# Patient Record
Sex: Female | Born: 1950
Health system: Southern US, Community
[De-identification: ages and names within clinical notes are randomized; demographics above are authoritative.]

## PROBLEM LIST (undated history)

## (undated) DIAGNOSIS — C801 Malignant (primary) neoplasm, unspecified: Secondary | ICD-10-CM

---

## 2017-09-16 ENCOUNTER — Other Ambulatory Visit: Payer: Self-pay | Admitting: Internal Medicine

## 2017-09-16 ENCOUNTER — Ambulatory Visit
Admission: RE | Admit: 2017-09-16 | Discharge: 2017-09-16 | Disposition: A | Discharge status: Home / Self Care | Payer: Self-pay | Source: Ambulatory Visit | Attending: Internal Medicine | Admitting: Internal Medicine

## 2017-09-16 DIAGNOSIS — R0789 Other chest pain: Secondary | ICD-10-CM

## 2017-09-19 ENCOUNTER — Inpatient Hospital Stay (HOSPITAL_COMMUNITY)
Admission: EM | Admit: 2017-09-19 | Discharge: 2017-09-28 | DRG: 840 | Disposition: A | Discharge status: Home / Self Care | Payer: Self-pay | Attending: Internal Medicine | Admitting: Internal Medicine

## 2017-09-19 ENCOUNTER — Emergency Department (HOSPITAL_COMMUNITY): Payer: Self-pay

## 2017-09-19 ENCOUNTER — Encounter (HOSPITAL_COMMUNITY): Payer: Self-pay | Admitting: *Deleted

## 2017-09-19 ENCOUNTER — Ambulatory Visit (HOSPITAL_COMMUNITY)
Admission: EM | Admit: 2017-09-19 | Discharge: 2017-09-19 | Disposition: A | Discharge status: Home / Self Care | Payer: Self-pay | Attending: Internal Medicine | Admitting: Internal Medicine

## 2017-09-19 ENCOUNTER — Inpatient Hospital Stay (HOSPITAL_COMMUNITY): Payer: Self-pay

## 2017-09-19 ENCOUNTER — Other Ambulatory Visit: Payer: Self-pay

## 2017-09-19 DIAGNOSIS — M899 Disorder of bone, unspecified: Secondary | ICD-10-CM

## 2017-09-19 DIAGNOSIS — R188 Other ascites: Secondary | ICD-10-CM | POA: Diagnosis present

## 2017-09-19 DIAGNOSIS — R160 Hepatomegaly, not elsewhere classified: Secondary | ICD-10-CM

## 2017-09-19 DIAGNOSIS — R0600 Dyspnea, unspecified: Secondary | ICD-10-CM

## 2017-09-19 DIAGNOSIS — N1832 Chronic kidney disease, stage 3b: Secondary | ICD-10-CM

## 2017-09-19 DIAGNOSIS — I509 Heart failure, unspecified: Secondary | ICD-10-CM | POA: Diagnosis present

## 2017-09-19 DIAGNOSIS — E162 Hypoglycemia, unspecified: Secondary | ICD-10-CM | POA: Diagnosis present

## 2017-09-19 DIAGNOSIS — I071 Rheumatic tricuspid insufficiency: Secondary | ICD-10-CM | POA: Diagnosis present

## 2017-09-19 DIAGNOSIS — D689 Coagulation defect, unspecified: Secondary | ICD-10-CM | POA: Diagnosis present

## 2017-09-19 DIAGNOSIS — R079 Chest pain, unspecified: Secondary | ICD-10-CM

## 2017-09-19 DIAGNOSIS — N179 Acute kidney failure, unspecified: Secondary | ICD-10-CM | POA: Diagnosis present

## 2017-09-19 DIAGNOSIS — T380X5A Adverse effect of glucocorticoids and synthetic analogues, initial encounter: Secondary | ICD-10-CM | POA: Diagnosis present

## 2017-09-19 DIAGNOSIS — C9002 Multiple myeloma in relapse: Secondary | ICD-10-CM

## 2017-09-19 DIAGNOSIS — I4892 Unspecified atrial flutter: Secondary | ICD-10-CM | POA: Diagnosis present

## 2017-09-19 DIAGNOSIS — K761 Chronic passive congestion of liver: Secondary | ICD-10-CM | POA: Diagnosis present

## 2017-09-19 DIAGNOSIS — K7200 Acute and subacute hepatic failure without coma: Secondary | ICD-10-CM | POA: Diagnosis present

## 2017-09-19 DIAGNOSIS — D649 Anemia, unspecified: Secondary | ICD-10-CM

## 2017-09-19 DIAGNOSIS — I110 Hypertensive heart disease with heart failure: Secondary | ICD-10-CM | POA: Diagnosis present

## 2017-09-19 DIAGNOSIS — R000 Tachycardia, unspecified: Secondary | ICD-10-CM

## 2017-09-19 DIAGNOSIS — R071 Chest pain on breathing: Secondary | ICD-10-CM

## 2017-09-19 DIAGNOSIS — J90 Pleural effusion, not elsewhere classified: Secondary | ICD-10-CM

## 2017-09-19 DIAGNOSIS — I272 Pulmonary hypertension, unspecified: Secondary | ICD-10-CM | POA: Diagnosis present

## 2017-09-19 DIAGNOSIS — E875 Hyperkalemia: Secondary | ICD-10-CM | POA: Diagnosis present

## 2017-09-19 DIAGNOSIS — J9811 Atelectasis: Secondary | ICD-10-CM | POA: Diagnosis present

## 2017-09-19 DIAGNOSIS — I484 Atypical atrial flutter: Secondary | ICD-10-CM

## 2017-09-19 DIAGNOSIS — D6959 Other secondary thrombocytopenia: Secondary | ICD-10-CM | POA: Diagnosis present

## 2017-09-19 DIAGNOSIS — D630 Anemia in neoplastic disease: Secondary | ICD-10-CM | POA: Diagnosis present

## 2017-09-19 DIAGNOSIS — C9000 Multiple myeloma not having achieved remission: Principal | ICD-10-CM | POA: Diagnosis present

## 2017-09-19 DIAGNOSIS — E861 Hypovolemia: Secondary | ICD-10-CM | POA: Diagnosis present

## 2017-09-19 LAB — URINALYSIS, ROUTINE W REFLEX MICROSCOPIC
BILIRUBIN URINE: NEGATIVE
Glucose, UA: NEGATIVE mg/dL
Ketones, ur: NEGATIVE mg/dL
Nitrite: NEGATIVE
Protein, ur: 30 mg/dL — AB
SPECIFIC GRAVITY, URINE: 1.011 (ref 1.005–1.030)
pH: 6 (ref 5.0–8.0)

## 2017-09-19 LAB — COMPREHENSIVE METABOLIC PANEL
ALT: 42 U/L (ref 0–44)
ANION GAP: 13 (ref 5–15)
AST: 65 U/L — ABNORMAL HIGH (ref 15–41)
Albumin: 2.5 g/dL — ABNORMAL LOW (ref 3.5–5.0)
Alkaline Phosphatase: 69 U/L (ref 38–126)
BILIRUBIN TOTAL: 0.7 mg/dL (ref 0.3–1.2)
BUN: 15 mg/dL (ref 8–23)
CALCIUM: 9.2 mg/dL (ref 8.9–10.3)
CO2: 22 mmol/L (ref 22–32)
CREATININE: 1.45 mg/dL — AB (ref 0.44–1.00)
Chloride: 101 mmol/L (ref 98–111)
GFR calc Af Amer: 42 mL/min — ABNORMAL LOW (ref >60)
GFR calc non Af Amer: 36 mL/min — ABNORMAL LOW (ref >60)
GLUCOSE: 109 mg/dL — AB (ref 70–99)
Potassium: 4.4 mmol/L (ref 3.5–5.1)
Sodium: 136 mmol/L (ref 135–145)
TOTAL PROTEIN: 10.8 g/dL — AB (ref 6.5–8.1)

## 2017-09-19 LAB — I-STAT TROPONIN, ED: TROPONIN I, POC: 0 ng/mL (ref 0.00–0.08)

## 2017-09-19 LAB — POCT I-STAT, CHEM 8
BUN: 17 mg/dL (ref 8–23)
CHLORIDE: 100 mmol/L (ref 98–111)
CREATININE: 1.5 mg/dL — AB (ref 0.44–1.00)
Calcium, Ion: 1.26 mmol/L (ref 1.15–1.40)
Glucose, Bld: 112 mg/dL — ABNORMAL HIGH (ref 70–99)
HEMATOCRIT: 31 % — AB (ref 36.0–46.0)
Hemoglobin: 10.5 g/dL — ABNORMAL LOW (ref 12.0–15.0)
Potassium: 4.3 mmol/L (ref 3.5–5.1)
Sodium: 138 mmol/L (ref 135–145)
TCO2: 25 mmol/L (ref 22–32)

## 2017-09-19 LAB — TROPONIN I: Troponin I: <0.03 ng/mL (ref <0.03)

## 2017-09-19 LAB — CBC
HCT: 28.4 % — ABNORMAL LOW (ref 36.0–46.0)
Hemoglobin: 8.8 g/dL — ABNORMAL LOW (ref 12.0–15.0)
MCH: 26.9 pg (ref 26.0–34.0)
MCHC: 31 g/dL (ref 30.0–36.0)
MCV: 86.9 fL (ref 78.0–100.0)
PLATELETS: 110 10*3/uL — AB (ref 150–400)
RBC: 3.27 MIL/uL — ABNORMAL LOW (ref 3.87–5.11)
RDW: 20.4 % — AB (ref 11.5–15.5)
WBC: 7 10*3/uL (ref 4.0–10.5)

## 2017-09-19 LAB — T4, FREE: Free T4: 1.13 ng/dL (ref 0.82–1.77)

## 2017-09-19 LAB — TYPE AND SCREEN
ABO/RH(D): O POS
ANTIBODY SCREEN: NEGATIVE

## 2017-09-19 LAB — MRSA PCR SCREENING: MRSA BY PCR: NEGATIVE

## 2017-09-19 LAB — FERRITIN: Ferritin: 463 ng/mL — ABNORMAL HIGH (ref 11–307)

## 2017-09-19 LAB — TSH
TSH: 2.5 u[IU]/mL (ref 0.350–4.500)
TSH: 2.603 u[IU]/mL (ref 0.350–4.500)

## 2017-09-19 LAB — MAGNESIUM: Magnesium: 2.1 mg/dL (ref 1.7–2.4)

## 2017-09-19 LAB — FOLATE: Folate: 7.7 ng/mL (ref >5.9)

## 2017-09-19 LAB — RETICULOCYTES
RBC.: 3.22 MIL/uL — AB (ref 3.87–5.11)
RETIC CT PCT: 2.9 % (ref 0.4–3.1)
Retic Count, Absolute: 93.4 10*3/uL (ref 19.0–186.0)

## 2017-09-19 LAB — I-STAT CG4 LACTIC ACID, ED
Lactic Acid, Venous: 2.21 mmol/L (ref 0.5–1.9)
Lactic Acid, Venous: 1.58 mmol/L (ref 0.5–1.9)

## 2017-09-19 LAB — ABO/RH: ABO/RH(D): O POS

## 2017-09-19 LAB — IRON AND TIBC
Iron: 111 ug/dL (ref 28–170)
Saturation Ratios: 51 % — ABNORMAL HIGH (ref 10.4–31.8)
TIBC: 218 ug/dL — ABNORMAL LOW (ref 250–450)
UIBC: 107 ug/dL

## 2017-09-19 LAB — LACTATE DEHYDROGENASE: LDH: 417 U/L — ABNORMAL HIGH (ref 98–192)

## 2017-09-19 LAB — VITAMIN B12: Vitamin B-12: 726 pg/mL (ref 180–914)

## 2017-09-19 LAB — SAVE SMEAR

## 2017-09-19 LAB — BRAIN NATRIURETIC PEPTIDE: B Natriuretic Peptide: 476.7 pg/mL — ABNORMAL HIGH (ref 0.0–100.0)

## 2017-09-19 MED ORDER — IOPAMIDOL (ISOVUE-370) INJECTION 76%
80.0000 mL | Freq: Once | INTRAVENOUS | Status: AC | PRN
  Administered 2017-09-19: 50 mL via INTRAVENOUS

## 2017-09-19 MED ORDER — AMIODARONE HCL IN DEXTROSE 360-4.14 MG/200ML-% IV SOLN
60.0000 mg/h | INTRAVENOUS | Status: AC
  Administered 2017-09-19 (×2): 60 mg/h via INTRAVENOUS
  Filled 2017-09-19 (×2): qty 200

## 2017-09-19 MED ORDER — MORPHINE SULFATE (PF) 2 MG/ML IV SOLN: 1.0000 mg | INTRAVENOUS | Status: DC | PRN

## 2017-09-19 MED ORDER — HEPARIN BOLUS VIA INFUSION
4000.0000 [IU] | Freq: Once | INTRAVENOUS | Status: DC
  Filled 2017-09-19: qty 4000

## 2017-09-19 MED ORDER — AMIODARONE HCL IN DEXTROSE 360-4.14 MG/200ML-% IV SOLN
30.0000 mg/h | INTRAVENOUS | Status: DC
  Administered 2017-09-20: 30 mg/h via INTRAVENOUS
  Filled 2017-09-19 (×2): qty 200

## 2017-09-19 MED ORDER — SENNOSIDES-DOCUSATE SODIUM 8.6-50 MG PO TABS: 1.0000 | ORAL_TABLET | Freq: Every evening | ORAL | Status: DC | PRN

## 2017-09-19 MED ORDER — ASPIRIN 81 MG PO CHEW
CHEWABLE_TABLET | ORAL | Status: AC
  Filled 2017-09-19: qty 4

## 2017-09-19 MED ORDER — ACETAMINOPHEN 325 MG PO TABS: 650.0000 mg | ORAL_TABLET | Freq: Four times a day (QID) | ORAL | Status: DC | PRN

## 2017-09-19 MED ORDER — HEPARIN (PORCINE) IN NACL 100-0.45 UNIT/ML-% IJ SOLN
950.0000 [IU]/h | INTRAMUSCULAR | Status: DC
  Administered 2017-09-19: 950 [IU]/h via INTRAVENOUS
  Filled 2017-09-19 (×3): qty 250

## 2017-09-19 MED ORDER — AMIODARONE LOAD VIA INFUSION
150.0000 mg | Freq: Once | INTRAVENOUS | Status: AC
  Administered 2017-09-19: 150 mg via INTRAVENOUS
  Filled 2017-09-19: qty 83.34

## 2017-09-19 MED ORDER — HYDRALAZINE HCL 20 MG/ML IJ SOLN: 5.0000 mg | Freq: Three times a day (TID) | INTRAMUSCULAR | Status: DC | PRN

## 2017-09-19 MED ORDER — ASPIRIN 81 MG PO CHEW
324.0000 mg | CHEWABLE_TABLET | Freq: Once | ORAL | Status: AC
  Administered 2017-09-19: 324 mg via ORAL

## 2017-09-19 MED ORDER — ONDANSETRON HCL 4 MG PO TABS: 4.0000 mg | ORAL_TABLET | Freq: Four times a day (QID) | ORAL | Status: DC | PRN

## 2017-09-19 MED ORDER — ACETAMINOPHEN 650 MG RE SUPP
650.0000 mg | Freq: Four times a day (QID) | RECTAL | Status: DC | PRN
Start: 2017-09-19 — End: 2017-09-24

## 2017-09-19 MED ORDER — HYDROCODONE-ACETAMINOPHEN 5-325 MG PO TABS
1.0000 | ORAL_TABLET | ORAL | Status: DC | PRN
  Administered 2017-09-19 – 2017-09-22 (×2): 1 via ORAL
  Administered 2017-09-22: 2 via ORAL
  Filled 2017-09-19: qty 2
  Filled 2017-09-19 (×2): qty 1

## 2017-09-19 MED ORDER — ONDANSETRON HCL 4 MG/2ML IJ SOLN
4.0000 mg | Freq: Four times a day (QID) | INTRAMUSCULAR | Status: DC | PRN
  Administered 2017-09-20 (×2): 4 mg via INTRAVENOUS
  Filled 2017-09-19 (×2): qty 2

## 2017-09-19 MED ORDER — BISACODYL 10 MG RE SUPP: 10.0000 mg | Freq: Every day | RECTAL | Status: DC | PRN

## 2017-09-19 MED ORDER — IOPAMIDOL (ISOVUE-370) INJECTION 76%
INTRAVENOUS | Status: AC
  Filled 2017-09-19: qty 100

## 2017-09-19 NOTE — ED Notes (Signed)
Spoke to [place] and [place] regarding heparin orders. Awaiting phone call back from [facility] for concerns with heparin orders

## 2017-09-19 NOTE — ED Notes (Signed)
Notified [facility], notified of code stemi and verified truck is coming for this patient

## 2017-09-19 NOTE — ED Notes (Addendum)
Lactic Acid- 2.21 Charge RN notified

## 2017-09-19 NOTE — ED Triage Notes (Signed)
Patient sent from Urgent Care for further evaluation of chest pain x3 weeks. Initially activated as code STEMI, cancelled by cardiology. Patient states she feels "like I have fluid built up". Patient alert, oriented, and in no apparent distress at this time.

## 2017-09-19 NOTE — Consult Note (Addendum)
Cardiology Consultation:   Patient ID: Debbie Chan; [id number]; 1950-05-18   Admit date: 09/19/2017 Date of Consult: 09/19/2017  Primary Care Provider: [name], MD Primary Cardiologist: new - initial consult with Dr. Haroldine Laws Primary Electrophysiologist:     Patient Profile:   Debbie Chan is a 68 y.o. female with no prior medical history who is being seen today for the evaluation of chest pain and abnormal EKG at the request of Dr. Reather Converse.  History of Present Illness:   Debbie Chan is visiting from [place] and does not have a PCP here. Daughter-in-law, an Therapist, sports at [facility], and son are at bedside to help with history.  Denies h/o known cardiac disease. Approximately 3 weeks ago, she developed cough and chest pain. She saw a doctor from her church who ordered a CXR and showed pleural effusion (09/16/17). She describes chest pain that is more consistent with bilateral chest wall pain on her lower rib cage. Chest wall pain is worse with cough and sometimes with deep inspiration. She also reports feeling palpitations and heart racing, although timeline is difficult to ascertain. She reported to urgent care and EKG was concerning for STEMI. Cardiology was consulted, but felt EKG was more consistent with atrial flutter RVR. She was transferred to [facility] for further evaluation.   Upon arrival, she had an elevated lactic acid of 2.21 and AKI with creatinine 1.50. Hb 10.5. Troponin POC negative. Repeat labs with Hb 8.8 and creatinine 1.45. She continues to complain of chest wall pain with cough.   PMHx:  None  PSHx:  None  Home Medications:  Prior to Admission medications   Medication Sig Start Date End Date Taking? Authorizing Provider  acetaminophen (TYLENOL) 500 MG tablet Take 500 mg by mouth every 6 (six) hours as needed for mild pain or moderate pain.    Yes Emergency, Nurse, RN    Inpatient Medications: Scheduled Meds: . iopamidol       Continuous Infusions:  PRN  Meds:   Allergies:   No Known Allergies  Social History:   Social History   Socioeconomic History  . Marital status: Widowed    Spouse name: Not on file  . Number of children: Not on file  . Years of education: Not on file  . Highest education level: Not on file  Occupational History  . Not on file  Social Needs  . Financial resource strain: Not on file  . Food insecurity:    Worry: Not on file    Inability: Not on file  . Transportation needs:    Medical: Not on file    Non-medical: Not on file  Tobacco Use  . Smoking status: Never Smoker  . Smokeless tobacco: Never Used  Substance and Sexual Activity  . Alcohol use: Not on file  . Drug use: Not on file  . Sexual activity: Not on file  Lifestyle  . Physical activity:    Days per week: Not on file    Minutes per session: Not on file  . Stress: Not on file  Relationships  . Social connections:    Talks on phone: Not on file    Gets together: Not on file    Attends religious service: Not on file    Active member of club or organization: Not on file    Attends meetings of clubs or organizations: Not on file    Relationship status: Not on file  . Intimate partner violence:    Fear of current or ex partner: Not on  file    Emotionally abused: Not on file    Physically abused: Not on file    Forced sexual activity: Not on file  Other Topics Concern  . Not on file  Social History Narrative  . Not on file    Family History:   No family h/o heart disease   ROS:  Please see the history of present illness.   All other ROS reviewed and negative.     Physical Exam/Data:   Vitals:   09/19/17 1230 09/19/17 1315 09/19/17 1345 09/19/17 1430  BP: (!) 166/110 (!) 152/114 (!) 143/84 (!) 148/107  Pulse: (!) 137 (!) 137  (!) 136  Resp: (!) 21 (!) 21  (!) 23  Temp:      TempSrc:      SpO2: 98% 93%  98%   No intake or output data in the 24 hours ending 09/19/17 1540 There were no vitals filed for this visit. There is  no height or weight on file to calculate BMI.  General:  Well nourished, well developed, in no acute distress HEENT: normal Neck: no JVD Vascular: No carotid bruits Cardiac:  Tachycardic rate, irregular rhythm no obvious murmur Lungs:  Respirations unlabored, unproductive cough, diminished in bases  Abd: soft, nontender, no hepatomegaly  Ext: no edema warm Musculoskeletal:  No deformities, BUE and BLE strength normal and equal Skin: warm and dry  Neuro:  CNs 2-12 intact, no focal abnormalities noted Psych:  Normal affect   EKG:  The EKG was personally reviewed and demonstrates:  Atrial flutter with RVR Telemetry:  Telemetry was personally reviewed and demonstrates: atrial flutter in the 130s    Relevant CV Studies:  Echo pending  Laboratory Data:  Chemistry Recent Labs  Lab 09/19/17 1057 09/19/17 1123  NA 138 136  K 4.3 4.4  CL 100 101  CO2  --  22  GLUCOSE 112* 109*  BUN 17 15  CREATININE 1.50* 1.45*  CALCIUM  --  9.2  GFRNONAA  --  36*  GFRAA  --  42*  ANIONGAP  --  13    Recent Labs  Lab 09/19/17 1123  PROT 10.8*  ALBUMIN 2.5*  AST 65*  ALT 42  ALKPHOS 69  BILITOT 0.7   Hematology Recent Labs  Lab 09/19/17 1057 09/19/17 1122  WBC  --  7.0  RBC  --  3.27*  HGB 10.5* 8.8*  HCT 31.0* 28.4*  MCV  --  86.9  MCH  --  26.9  MCHC  --  31.0  RDW  --  20.4*  PLT  --  110*   Cardiac EnzymesNo results for input(s): TROPONINI in the last 168 hours.  Recent Labs  Lab 09/19/17 1129  TROPIPOC 0.00    BNP Recent Labs  Lab 09/19/17 1343  BNP 476.7*    DDimer No results for input(s): DDIMER in the last 168 hours.  Radiology/Studies:  Dg Chest 2 View  Result Date: 09/16/2017 CLINICAL DATA:  Anterior chest wall pain for 2 weeks with a nonproductive cough. EXAM: CHEST - 2 VIEW COMPARISON:  None. FINDINGS: The cardiac silhouette is partially obscured though appears borderline enlarged. There is a moderate-sized right pleural effusion with associated  compressive atelectasis. Minimal left basilar opacity likely represents atelectasis. The upper lungs are clear bilaterally. No pneumothorax is identified. No acute osseous abnormality is seen. IMPRESSION: Moderate right pleural effusion with right greater than left basilar atelectasis. Electronically Signed   By: Logan Bores M.D.   On: 09/16/2017 15:47  Ct Angio Chest Pe W And/or Wo Contrast  Result Date: 09/19/2017 CLINICAL DATA:  67 year old female with acute chest pain and shortness of breath for 3 weeks. EXAM: CT ANGIOGRAPHY CHEST WITH CONTRAST TECHNIQUE: Multidetector CT imaging of the chest was performed using the standard protocol during bolus administration of intravenous contrast. Multiplanar CT image reconstructions and MIPs were obtained to evaluate the vascular anatomy. CONTRAST:  4m ISOVUE-370 IOPAMIDOL (ISOVUE-370) INJECTION 76% COMPARISON:  09/19/2017 chest radiograph FINDINGS: Cardiovascular: This is a technically satisfactory study. No pulmonary emboli are identified. Moderate to marked cardiomegaly noted with RIGHT atrial enlargement. Coronary artery and aortic atherosclerotic calcifications identified. No pericardial effusion or thoracic aortic aneurysm. Mediastinum/Nodes: No enlarged mediastinal, hilar, or axillary lymph nodes. Thyroid gland, trachea, and esophagus demonstrate no significant findings. Lungs/Pleura: A moderate RIGHT pleural effusion with RIGHT LOWER lung atelectasis noted. This effusion may be loculated along the RIGHT lung base. No pulmonary nodule, mass or consolidation.  No pneumothorax. Upper Abdomen: Hypodense areas along the liver may represent subcapsular fluid/masses versus parenchymal abnormalities. A small amount of fluid adjacent to the spleen is identified. Musculoskeletal: Multiple lytic lesions of the visualized bones identified involving the spine, ribs, clavicles and sternum. A 3.5 x 7 cm soft tissue destructive mass involving the anterolateral LEFT 7th rib  is noted. Review of the MIP images confirms the above findings. IMPRESSION: 1. Multiple lytic lesions throughout the visualized bones with 3.5 x 7 cm soft tissue mass involving the LEFT 7th rib, worrisome for metastatic disease or myeloma. 2. Moderate RIGHT pleural effusion and RIGHT LOWER lung atelectasis. This effusion may be loculated along the RIGHT lung base. Malignant effusion not excluded given bony findings. 3. Hypodense areas along the liver which may represent subcapsular fluid/masses versus hepatic lesions. Consider dedicated abdominal and pelvic CT with contrast for further evaluation. 4. No evidence of pulmonary emboli 5. Moderate to marked cardiomegaly 6. Coronary artery and Aortic Atherosclerosis (ICD10-I70.0). Electronically Signed   By: JMargarette CanadaM.D.   On: 09/19/2017 14:38   Dg Chest Portable 1 View  Result Date: 09/19/2017 CLINICAL DATA:  Chest pain EXAM: PORTABLE CHEST 1 VIEW COMPARISON:  09/16/2017 FINDINGS: Cardiac shadow remains enlarged. Right basilar atelectasis and associated effusion is again identified stable from the prior exam. Left lung is clear. No bony abnormality is noted. IMPRESSION: Right basilar atelectasis with associated effusions stable from the previous exam. Electronically Signed   By: MInez CatalinaM.D.   On: 09/19/2017 11:43    Assessment and Plan:   1. Chest wall pain - pain with cough in lateral rib cage area consistent with pleural effusion - CT chest with moderate right pleural effusion, soft tissue mass worrisome for metastatic disease or myeloma - defer to medicine/onc  2. Abnormal EKG, atrial flutter with RVR - atrial flutter with RVR - given her anemia, hesitate to anticoagulate - This patients CHA2DS2-VASc Score and unadjusted Ischemic Stroke Rate (% per year) is equal to 2.2 % stroke rate/year from a score of 2 (female, age) - risk factors for higher CHA2DS2-VASc Score may present themselves during this hospitalization - [facility]  given low CHA2DS2-VASc Score and anemia - this may be related to her pleural effusion that is possibly malignant - will start amiodarone IV - will order echocardiogram - TSH and Mg are normal  3. AKI - elevated creatinine  - per primary team  4. Anemia - Hb 8.8 (10.5) - baseline unknown - denies dark tarry stool and bleeding, except nose  bleed after ibuprofen (prescribed for chest wall pain) - no anticoagulation at this time   For questions or updates, please contact [facility] Please consult [email or web address] for contact info under Cardiology/STEMI.   Signed, Ledora Bottcher, [place]  09/19/2017 3:40 PM   Patient seen and examined with the above-signed Advanced Practice Provider and/or Housestaff. I personally reviewed laboratory data, imaging studies and relevant notes. I independently examined the patient and formulated the important aspects of the plan. I have edited the note to reflect any of my changes or salient points. I have personally discussed the plan with the patient and/or family.  67 y/o woman from [place] with no significant PMHx presents with CP, weakness and cough. Initial ECG shows AFL with RVR (unknown duration). Subsequent w/u has shown lytic rib lesion concerning for multiple myeloma or metastatic CA (Oncologic w/u underway).   Remains tachy on exam. BNP up but no evidence of HF on exam. HR in 130 range. ECG with atypical flutter.   Will start IV amio for rate control. Echo ordered. I d/w oncology. Will hold off on anticoagulation for now pending further scans. If OK for [facility] can start heparin as she may need TEE/DC-CV.   We will follow. D/w patient and her son.   Glori Bickers, MD  3:57 PM

## 2017-09-19 NOTE — ED Notes (Signed)
Pt taken to xray 

## 2017-09-19 NOTE — ED Notes (Addendum)
EKG obtained and shown to [facility], EKG meets STEMI criteria.  [facility] called and and Code STEMI activated, transportation notified.  Pt placed on the [place]

## 2017-09-19 NOTE — ED Notes (Signed)
MD at bedside. 

## 2017-09-19 NOTE — ED Triage Notes (Signed)
Patient states that her breathing is fast and that her abdomen/chest feels "heavy." Pt complains of feeling dizzy for the last couple weeks. Pt has results of chest x-ray done 09/16/17- findings+ right pleural effusion.

## 2017-09-19 NOTE — H&P (Signed)
History and Physical    Gertude Benito YHC:[id number] DOB: [birth year] DOA: 09/19/2017   PCP: [name], MD   Patient coming from:  Home    Chief Complaint: Shortness of breath and chest pain   HPI: Debbie Chan is a 67 y.o. female with no prior documented medical history, visiting from [place], arriving last May, staying with her family, brought from urgent care, after presenting with left-sided chest pressure, shest wall pain especially at the left lower rib cage.shortness of breath for the last 3 weeks without cough.  This was preceded by a visit to Dr. Annamarie Major, with similar complaints, who ordered a CXR, on 7/24 showing pleural effusion. She was to follow up but her symptoms worsened.  The patient denies any fever, chills or night sweats. Denies lower extremity pain, swelling, tenderness or erythema. Denies pre-syncopal episodes, palpitations or hemoptysis. Denies any bleeding issues such as epistaxis, hematemesis, hematuria or hematochezia. Ambulating without difficulty. Patient never had thrombotic events or prior PE/DVT history. Denies prior diagnosis of cancer. Denies a history of sickle cell disease or trait.  Reports history of tobacco. Denies taking hormone replacement therapy. Denies a history of peripartum thromboembolic event or history of recurrent miscarriages.There is no family history of blood clots or miscarriages.  Denies sedentary lifestyle. Denies taking Aspirin or NSAIDs. Patient states that has never had a hematological evaluation prior to this admission. Never had a bone marrow biopsy.  Denies any history of TB, or malaria. Denies history of HIV .  Denies any history of tobacco, alcohol or recreational drug use.  At urgent care, she was noted to have ST elevation for possible myocardial infarction.  ED Course:  BP (!) 135/99   Pulse (!) 138   Temp (!) 97.5 F (36.4 C) (Oral)   Resp (!) 26   SpO2 100%   Lactic acid at the time was 2.21,a normalized at 1.58 Creatinine was  1.45, GFR 42. Ca 9.2  Hemoglobin 10.5, will repeat hemoglobin of 8.8 Troponin negative Platelets 110 EKGJunctional tachycardia Borderline right axis deviation Nonspecific T abnormalities, lateral leads Prolonged QT interval Ct Angio Chest Pe W And/or Wo Contrast was negative for PE, however Multiple lytic lesions throughout the visualized bones with 3.5 x 7 cm soft tissue mass involving the LEFT 7th rib, worrisome for metastatic disease or myeloma. 2. Moderate RIGHT pleural effusion and RIGHT LOWER lung atelectasis. This effusion may be loculated along the RIGHT lung base. Malignant effusion not excluded given bony findings. 3. Hypodense areas along the liver which may represent subcapsular fluid/masses versus hepatic lesions   Review of Systems:  As per HPI otherwise all other systems reviewed and are negative  No past medical history on file.  No past surgical history on file.  Social History Social History   Socioeconomic History  . Marital status: Widowed    Spouse name: Not on file  . Number of children: Not on file  . Years of education: Not on file  . Highest education level: Not on file  Occupational History  . Not on file  Social Needs  . Financial resource strain: Not on file  . Food insecurity:    Worry: Not on file    Inability: Not on file  . Transportation needs:    Medical: Not on file    Non-medical: Not on file  Tobacco Use  . Smoking status: Never Smoker  . Smokeless tobacco: Never Used  Substance and Sexual Activity  . Alcohol use: Not on file  . Drug  use: Not on file  . Sexual activity: Not on file  Lifestyle  . Physical activity:    Days per week: Not on file    Minutes per session: Not on file  . Stress: Not on file  Relationships  . Social connections:    Talks on phone: Not on file    Gets together: Not on file    Attends religious service: Not on file    Active member of club or organization: Not on file    Attends meetings of clubs or  organizations: Not on file    Relationship status: Not on file  . Intimate partner violence:    Fear of current or ex partner: Not on file    Emotionally abused: Not on file    Physically abused: Not on file    Forced sexual activity: Not on file  Other Topics Concern  . Not on file  Social History Narrative  . Not on file     No Known Allergies  No family history on file.     Prior to Admission medications   Medication Sig Start Date End Date Taking? Authorizing Provider  acetaminophen (TYLENOL) 500 MG tablet Take 500 mg by mouth every 6 (six) hours as needed for mild pain or moderate pain.    Yes Emergency, Nurse, RN     Physical Exam:  Vitals:   09/19/17 1430 09/19/17 1600 09/19/17 1615 09/19/17 1710  BP: (!) 148/107 (!) 137/99 (!) 140/105 (!) 135/99  Pulse: (!) 136 (!) 140 (!) 139 (!) 138  Resp: (!) 23 (!) 23 (!) 22 (!) 26  Temp:      TempSrc:      SpO2: 98% 97% 97% 100%   Constitutional: NAD, calm, comfortable, tearful  Eyes: PERRL, lids and conjunctivae normal ENMT: Mucous membranes are moist, without exudate or lesions  Neck: normal, supple, no masses, no thyromegaly Respiratory: No tachypneic at the time of evaluation no accessory muscle usage. She has decreased breath sounds on the right lower field.   No wheezing. Cardiovascular: Tachycardic, regular rate and rhythm with occasional pauses or ectopic beats.  2 out of 6 murmur, rubs or gallops. No extremity edema. 2+ pedal pulses. No carotid bruits.  Abdomen: Soft, non tender, No hepatosplenomegaly. Bowel sounds positive.  Musculoskeletal: no clubbing / cyanosis. Moves all extremities.  No spinal tenderness Skin: no jaundice, No lesions.  Neurologic: Sensation intact  Strength equal in all extremities Psychiatric:   Alert and oriented x 3.  Tearful mood.     Labs on Admission: I have personally reviewed following labs and imaging studies  CBC: Recent Labs  Lab 09/19/17 1057 09/19/17 1122  WBC  --  7.0    HGB 10.5* 8.8*  HCT 31.0* 28.4*  MCV  --  86.9  PLT  --  110*    Basic Metabolic Panel: Recent Labs  Lab 09/19/17 1057 09/19/17 1123  NA 138 136  K 4.3 4.4  CL 100 101  CO2  --  22  GLUCOSE 112* 109*  BUN 17 15  CREATININE 1.50* 1.45*  CALCIUM  --  9.2  MG  --  2.1    GFR: CrCl cannot be calculated (Unknown ideal weight.).  Liver Function Tests: Recent Labs  Lab 09/19/17 1123  AST 65*  ALT 42  ALKPHOS 69  BILITOT 0.7  PROT 10.8*  ALBUMIN 2.5*   No results for input(s): LIPASE, AMYLASE in the last 168 hours. No results for input(s): AMMONIA in the last  168 hours.  Coagulation Profile: No results for input(s): INR, PROTIME in the last 168 hours.  Cardiac Enzymes: No results for input(s): CKTOTAL, CKMB, CKMBINDEX, TROPONINI in the last 168 hours.  BNP (last 3 results) No results for input(s): PROBNP in the last 8760 hours.  HbA1C: No results for input(s): HGBA1C in the last 72 hours.  CBG: No results for input(s): GLUCAP in the last 168 hours.  Lipid Profile: No results for input(s): CHOL, HDL, LDLCALC, TRIG, CHOLHDL, LDLDIRECT in the last 72 hours.  Thyroid Function Tests: Recent Labs    09/19/17 1123  TSH 2.500    Anemia Panel: No results for input(s): VITAMINB12, FOLATE, FERRITIN, TIBC, IRON, RETICCTPCT in the last 72 hours.  Urine analysis: No results found for: COLORURINE, APPEARANCEUR, LABSPEC, PHURINE, GLUCOSEU, HGBUR, BILIRUBINUR, KETONESUR, PROTEINUR, UROBILINOGEN, NITRITE, LEUKOCYTESUR  Sepsis Labs: _0 (procalcitonin:4,lacticidven:4) )No results found for this or any previous visit (from the past 240 hour(s)).   Radiological Exams on Admission: Ct Angio Chest Pe W And/or Wo Contrast  Result Date: 09/19/2017 CLINICAL DATA:  67 year old female with acute chest pain and shortness of breath for 3 weeks. EXAM: CT ANGIOGRAPHY CHEST WITH CONTRAST TECHNIQUE: Multidetector CT imaging of the chest was performed using the standard  protocol during bolus administration of intravenous contrast. Multiplanar CT image reconstructions and MIPs were obtained to evaluate the vascular anatomy. CONTRAST:  30m ISOVUE-370 IOPAMIDOL (ISOVUE-370) INJECTION 76% COMPARISON:  09/19/2017 chest radiograph FINDINGS: Cardiovascular: This is a technically satisfactory study. No pulmonary emboli are identified. Moderate to marked cardiomegaly noted with RIGHT atrial enlargement. Coronary artery and aortic atherosclerotic calcifications identified. No pericardial effusion or thoracic aortic aneurysm. Mediastinum/Nodes: No enlarged mediastinal, hilar, or axillary lymph nodes. Thyroid gland, trachea, and esophagus demonstrate no significant findings. Lungs/Pleura: A moderate RIGHT pleural effusion with RIGHT LOWER lung atelectasis noted. This effusion may be loculated along the RIGHT lung base. No pulmonary nodule, mass or consolidation.  No pneumothorax. Upper Abdomen: Hypodense areas along the liver may represent subcapsular fluid/masses versus parenchymal abnormalities. A small amount of fluid adjacent to the spleen is identified. Musculoskeletal: Multiple lytic lesions of the visualized bones identified involving the spine, ribs, clavicles and sternum. A 3.5 x 7 cm soft tissue destructive mass involving the anterolateral LEFT 7th rib is noted. Review of the MIP images confirms the above findings. IMPRESSION: 1. Multiple lytic lesions throughout the visualized bones with 3.5 x 7 cm soft tissue mass involving the LEFT 7th rib, worrisome for metastatic disease or myeloma. 2. Moderate RIGHT pleural effusion and RIGHT LOWER lung atelectasis. This effusion may be loculated along the RIGHT lung base. Malignant effusion not excluded given bony findings. 3. Hypodense areas along the liver which may represent subcapsular fluid/masses versus hepatic lesions. Consider dedicated abdominal and pelvic CT with contrast for further evaluation. 4. No evidence of pulmonary emboli 5.  Moderate to marked cardiomegaly 6. Coronary artery and Aortic Atherosclerosis (ICD10-I70.0). Electronically Signed   By: JMargarette CanadaM.D.   On: 09/19/2017 14:38   Dg Chest Portable 1 View  Result Date: 09/19/2017 CLINICAL DATA:  Chest pain EXAM: PORTABLE CHEST 1 VIEW COMPARISON:  09/16/2017 FINDINGS: Cardiac shadow remains enlarged. Right basilar atelectasis and associated effusion is again identified stable from the prior exam. Left lung is clear. No bony abnormality is noted. IMPRESSION: Right basilar atelectasis with associated effusions stable from the previous exam. Electronically Signed   By: MInez CatalinaM.D.   On: 09/19/2017 11:43   Dg Bone Survey Met  Result Date: 09/19/2017 CLINICAL  DATA:  Lytic lesions shown on prior chest CT EXAM: METASTATIC BONE SURVEY COMPARISON:  Chest CT performed earlier today FINDINGS: Possible small lytic lesions scattered throughout the calvarium. Lytic lesions noted in the mid to distal left clavicle and possible mid right clavicle. Scattered small lytic lesions throughout the humeri bilaterally and within the proximal to mid left radius and ulna. Small lytic lesion within the mid right radius. No focal lytic lesion or acute bony abnormality within the cervical, thoracic or lumbar spine. Mild degenerative facet disease throughout the lumbar spine. Probable small lytic lesions within the proximal to midshaft of the left femur and possibly within the shafts of the left tibia and fibula. Lytic lesions scattered throughout the right tibia. Heart is borderline in size. Right pleural effusion with right lower lobe atelectasis. Left lung clear. Density projecting over the left lateral lower chest represents the soft tissue mass associated with the left 7th rib anteriorly. IMPRESSION: Numerous small lytic lesions scattered throughout the skull including the calvarium, bilateral clavicles, humeri, radius/ulna bilaterally, left femur, and bilateral tibia/fibula. Findings  compatible with metastatic disease or myeloma. The sternum and rib lesions are better seen on chest CT. Soft tissue density over the left lower chest corresponds to the soft tissue mass associated with the anterior left 7th rib. Small to moderate right pleural effusion with right base atelectasis. Electronically Signed   By: Rolm Baptise M.D.   On: 09/19/2017 17:13    EKG: Independently reviewed.  Assessment/Plan Principal Problem:   Chest pain Active Problems:   Lytic bone lesions on xray   Tachycardia   Anemia   AKI (acute kidney injury) (HCC)   Pleural effusion   Left chest wall pain, with lateral rib cage area pain, not reproducible, likely due to   myeloma.  CT Angio of the chest negative for PE, but noted multiple lytic lesions, with a soft tissue mass involving the left seventh rib, moderate right pleural effusion, and a hypodense area along the liver, which could represent fluid versus hepatic lesions.  Hemoglobin 8.8, lactic acid is normal at 1.58, mild AKI with a creatinine of 1.45, troponin negative, platelets 110.  Calcium is normal at 9.2.  Alb 2.5 , Prot in serum 10 . Sodium normal . Metastatic bone survey confirms diffuse metastatic lesions throughout the body Inpatient stepdown Beta-2 microglobulin LDH  Light chains, TP, 24 h urine  Quantitative immunoglobulins Save smear SPEP and UPEP with immunofixation CT of the abdomen and pelvis to complete staging, once creatinine improves. Patient will need bone marrow biopsy at some point, in view of the weekend, this will have to be done during the week.  The patient will also need oncology evaluation for further recommendations and to set an appointment as an outpatient once discharged. Epo levels  2 D echo  Pleural effusion, is possibly malignant, she may need diagnostic thoracentesis per IR. Vicodin, Tylenol for pain, morphine for severe pain  Abnormal EKG, atrial flutter with RVR.EKGJunctional tachycardia Borderline right  axis deviation Nonspecific T abnormalities, lateral leads Prolonged QT interval  She was started on amiodarone IV per cardiology. Tn neg  Chadsvasc 2  although needs monitoring closely, anticoagulation has been initiated with heparin at this time, as the patient is not having any acute bleeding issues.  This can always be discontinued.  However, in view of her rapid ventricular rate, it is prudent to continue with anticoagulation 2 D echo  TSH   Anemia, symptomatic, in the setting of malignancy  Hemoglobin is 8.8,  was 10.53 days ago.  Baseline is unknown.  No acute bleeding issues are noted.  She is on anticoagulation, which warrants close monitoring of her CBC. Type and screen, transfuse for Hb of less than 7  Repeat CBC in am  Anemia panel Save smear   Thrombocytopenia Likely due to malignancy, ?splenomegaly. Platelets 110  No transfusion is indicated at this time Monitor counts closely Transfuse 1 unit of platelets if count is less or equal than 10,000 or 20,000 if the patient is acutely bleeding Hold Heparin if  platelets drop to less than 50,000   Acute Kidney Injury likely due to Multiple myeloma, current creatinine is 1.45, no other labs to compare. Lab Results  Component Value Date   CREATININE 1.45 (H) 09/19/2017   CREATININE 1.50 (H) 09/19/2017  BMET in am      Hypertension BP  135/99   Pulse 138  , not on meds  Add Hydralazine Q6 hours as needed for BP 180   DVT prophylaxis:  Heparin drip due to cardiac issues   Code Status:    Full code Family Communication:  Discussed with patient and family Disposition Plan: Expect patient to be discharged to home after condition improves Consults called:    Cardiology, care management to help with medication needs Admission status: Inpatient stepdown   Sharene Butters, PA-C [facility]   Amion text  [phone]   09/19/2017, 5:21 PM

## 2017-09-19 NOTE — ED Provider Notes (Signed)
[facility] EMERGENCY DEPARTMENT Provider Note   CSN: [id number] Arrival date & time: 09/19/17  1107     History   Chief Complaint Chief Complaint  Patient presents with  . Chest Pain    HPI Debbie Chan is a 67 y.o. female.  Patient presents from urgent care for possible ST elevation myocardial infarction.  Patient has had worsening chest pressure shortness of breath for 2 to 3 weeks.  Patient had recent x-ray which showed pleural effusion on the right.  Patient recently visiting from [place] and staying with family.  Patient denies any history of TB patient has had malaria.  Patient denies known blood clot history, recent surgery, cardiac history.  Patient has had cough and shortness of breath without fever recently.  No smoking or alcohol history.  Patient does feel heavier and bloated.     No past medical history on file.  There are no active problems to display for this patient.   No past surgical history on file.   OB History   None      Home Medications    Prior to Admission medications   Medication Sig Start Date End Date Taking? Authorizing Provider  acetaminophen (TYLENOL) 500 MG tablet Take 500 mg by mouth every 6 (six) hours as needed for mild pain or moderate pain.    Yes Emergency, Nurse, RN    Family History No family history on file.  Social History Social History   Tobacco Use  . Smoking status: Never Smoker  . Smokeless tobacco: Never Used  Substance Use Topics  . Alcohol use: Not on file  . Drug use: Not on file     Allergies   Patient has no known allergies.   Review of Systems Review of Systems  Constitutional: Negative for chills and fever.  HENT: Negative for congestion.   Eyes: Negative for visual disturbance.  Respiratory: Positive for shortness of breath.   Cardiovascular: Positive for chest pain.  Gastrointestinal: Negative for abdominal pain and vomiting.  Genitourinary: Negative for dysuria and flank  pain.  Musculoskeletal: Negative for back pain, neck pain and neck stiffness.  Skin: Negative for rash.  Neurological: Negative for light-headedness and headaches.     Physical Exam Updated Vital Signs BP (!) 152/114   Pulse (!) 137   Temp (!) 97.5 F (36.4 C) (Oral)   Resp (!) 21   SpO2 93%   Physical Exam  Constitutional: She is oriented to person, place, and time. She appears well-developed and well-nourished.  HENT:  Head: Normocephalic and atraumatic.  Eyes: Conjunctivae are normal. Right eye exhibits no discharge. Left eye exhibits no discharge.  Neck: Normal range of motion. Neck supple. No tracheal deviation present.  Cardiovascular: Regular rhythm. Tachycardia present.  Pulmonary/Chest: Accessory muscle usage present. Tachypnea noted. She has decreased breath sounds in the right lower field.  Abdominal: Soft. She exhibits no distension. There is no tenderness. There is no guarding.  Musculoskeletal: She exhibits no edema.  Neurological: She is alert and oriented to person, place, and time.  Skin: Skin is warm. No rash noted.  Psychiatric: She has a normal mood and affect.  Nursing note and vitals reviewed.    ED Treatments / Results  Labs (all labs ordered are listed, but only abnormal results are displayed) Labs Reviewed  CBC - Abnormal; Notable for the following components:      Result Value   RBC 3.27 (*)    Hemoglobin 8.8 (*)    HCT 28.4 (*)  RDW 20.4 (*)    Platelets 110 (*)    All other components within normal limits  COMPREHENSIVE METABOLIC PANEL - Abnormal; Notable for the following components:   Glucose, Bld 109 (*)    Creatinine, Ser 1.45 (*)    Total Protein 10.8 (*)    Albumin 2.5 (*)    AST 65 (*)    GFR calc non Af Amer 36 (*)    GFR calc Af Amer 42 (*)    All other components within normal limits  I-STAT CG4 LACTIC ACID, ED - Abnormal; Notable for the following components:   Lactic Acid, Venous 2.21 (*)    All other components within  normal limits  MAGNESIUM  TSH  BRAIN NATRIURETIC PEPTIDE  I-STAT TROPONIN, ED  I-STAT CG4 LACTIC ACID, ED  TYPE AND SCREEN    EKG EKG Interpretation  Date/Time:  Saturday September 19 2017 11:16:32 EDT Ventricular Rate:  139 PR Interval:    QRS Duration: 103 QT Interval:  353 QTC Calculation: 537 R Axis:   83 Text Interpretation:  Junctional tachycardia Borderline right axis deviation Nonspecific T abnormalities, lateral leads Prolonged QT interval Confirmed by Elnora Morrison [phone]) on 09/19/2017 12:06:18 PM   Radiology Dg Chest Portable 1 View  Result Date: 09/19/2017 CLINICAL DATA:  Chest pain EXAM: PORTABLE CHEST 1 VIEW COMPARISON:  09/16/2017 FINDINGS: Cardiac shadow remains enlarged. Right basilar atelectasis and associated effusion is again identified stable from the prior exam. Left lung is clear. No bony abnormality is noted. IMPRESSION: Right basilar atelectasis with associated effusions stable from the previous exam. Electronically Signed   By: Inez Catalina M.D.   On: 09/19/2017 11:43    Procedures .Critical Care Performed by: Elnora Morrison, MD Authorized by: Elnora Morrison, MD   Critical care provider statement:    Critical care time (minutes):  35   Critical care start time:  09/19/2017 12:00 PM   Critical care end time:  09/19/2017 12:35 PM   Critical care time was exclusive of:  Separately billable procedures and treating other patients and teaching time   Critical care was necessary to treat or prevent imminent or life-threatening deterioration of the following conditions:  Cardiac failure and respiratory failure   Critical care was time spent personally by me on the following activities:  Examination of patient, evaluation of patient's response to treatment, ordering and review of laboratory studies, ordering and review of radiographic studies, ordering and performing treatments and interventions, pulse oximetry and re-evaluation of patient's condition   (including  critical care time)   EMERGENCY DEPARTMENT [place] CARDIAC EXAM "Study: Limited Ultrasound of the Heart and Pericardium"  INDICATIONS:Dyspnea Multiple views of the heart and pericardium were obtained in real-time with a multi-frequency probe.  PERFORMED QV:ZDGLOV IMAGES ARCHIVED?: Yes LIMITATIONS:  Body habitus VIEWS USED: Subcostal 4 chamber, Parasternal long axis, Parasternal short axis, Apical 4 chamber  and Inferior Vena Cava INTERPRETATION: Cardiac activity present, Pericardial effusioin absent and Decreased contractility  EMERGENCY DEPARTMENT [place] LUNG EXAM "Study: Limited Ultrasound of the Lung and Thorax"  INDICATIONS: Dyspnea Multiple views of both lungs using sagittal orientation were obtained.  PERFORMED BY: Myself IMAGES ARCHIVED?: Yes LIMITATIONS: Respiratory distress VIEWS USED: Posterior lung fields INTERPRETATION: Pleural effusion   Medications Ordered in ED Medications - No data to display   Initial Impression / Assessment and Plan / ED Course  I have reviewed the triage vital signs and the nursing notes.  Pertinent labs & imaging results that were available during my care of the  patient were reviewed by me and considered in my medical decision making (see chart for details).    Patient presents as possible myocardial infarction, EKG reviewed mild elevation inferiorly.  Cardiology reviewed and did not feel this was an ST elevation heart attack.  On exam patient has increased respiratory effort, bedside ultrasound revealed decreased ejection fraction with right pleural effusion, and dilated IVC.  Concern for cardiomyopathy/heart failure, patient does not have any known cancer history.  Nasal cannula in the ER.  Cards evaluated in ED. CT concerning for multiple myeloma or mets.  The patients results and plan were reviewed and discussed.   Any x-rays performed were independently reviewed by myself.   Differential diagnosis were considered with the presenting  HPI.  Medications  iopamidol (ISOVUE-370) 76 % injection (has no administration in time range)  iopamidol (ISOVUE-370) 76 % injection 80 mL (50 mLs Intravenous Contrast Given 09/19/17 1352)    Vitals:   09/19/17 1230 09/19/17 1315 09/19/17 1345 09/19/17 1430  BP: (!) 166/110 (!) 152/114 (!) 143/84 (!) 148/107  Pulse: (!) 137 (!) 137  (!) 136  Resp: (!) 21 (!) 21  (!) 23  Temp:      TempSrc:      SpO2: 98% 93%  98%    Final diagnoses:  Acute chest pain  Pleural effusion on right    Admission/ observation were discussed with the admitting physician, patient and/or family and they are comfortable with the plan.     Final Clinical Impressions(s) / ED Diagnoses   Final diagnoses:  Acute chest pain  Pleural effusion on right    ED Discharge Orders    None       Elnora Morrison, MD 09/19/17 1529

## 2017-09-19 NOTE — ED Notes (Signed)
Heparin drip to be started once received per [place], [place]

## 2017-09-19 NOTE — ED Provider Notes (Signed)
[place]    CSN: [id number] Arrival date & time: 09/19/17  1001     History   Chief Complaint No chief complaint on file.   HPI Shada Nienaber is a 67 y.o. female no significant past medical history presenting today for evaluation of chest and belly heaviness, dizziness as well as breathing fast.  Symptoms have been crusting over the past 3 weeks.  She was seen by a doctor at her [facility] he was doing community work.  He ordered a chest x-ray.  Chest x-ray performed on 7/24 showed moderate pleural effusion on right side.  She presents today with worsening heaviness.  HPI  History reviewed. No pertinent past medical history.  There are no active problems to display for this patient.   History reviewed. No pertinent surgical history.  OB History   None      Home Medications    Prior to Admission medications   Medication Sig Start Date End Date Taking? Authorizing Provider  acetaminophen (TYLENOL) 500 MG tablet Take 500 mg by mouth every 6 (six) hours as needed.   Yes Emergency, Nurse, RN    Family History History reviewed. No pertinent family history.  Social History Social History   Tobacco Use  . Smoking status: Never Smoker  . Smokeless tobacco: Never Used  Substance Use Topics  . Alcohol use: Not on file  . Drug use: Not on file     Allergies   Patient has no allergy information on record.   Review of Systems Review of Systems  Constitutional: Negative for fatigue and fever.  HENT: Negative for congestion, sinus pressure and sore throat.   Eyes: Negative for photophobia, pain and visual disturbance.  Respiratory: Positive for shortness of breath. Negative for cough.   Cardiovascular: Positive for chest pain.  Gastrointestinal: Positive for abdominal pain. Negative for nausea and vomiting.  Genitourinary: Negative for decreased urine volume and hematuria.  Musculoskeletal: Negative for myalgias, neck pain and neck stiffness.    Neurological: Negative for dizziness, syncope, facial asymmetry, speech difficulty, weakness, light-headedness, numbness and headaches.     Physical Exam Triage Vital Signs ED Triage Vitals  Enc Vitals Group     BP 09/19/17 1027 (!) 149/102     Pulse Rate 09/19/17 1027 (!) 139     Resp 09/19/17 1027 20     Temp 09/19/17 1027 97.9 F (36.6 C)     Temp Source 09/19/17 1027 Oral     SpO2 09/19/17 1027 99 %     Weight 09/19/17 1023 170 lb (77.1 kg)     Height --      Head Circumference --      Peak Flow --      Pain Score 09/19/17 1022 2     Pain Loc --      Pain Edu? --      Excl. in [place]? --    No data found.  Updated Vital Signs BP (!) 149/102 (BP Location: Right Arm) Comment: Notified Joy  Pulse (!) 139 Comment: Notified Joy  Temp 97.9 F (36.6 C) (Oral)   Resp 20   Wt 170 lb (77.1 kg)   SpO2 99%   Visual Acuity Right Eye Distance:   Left Eye Distance:   Bilateral Distance:    Right Eye Near:   Left Eye Near:    Bilateral Near:     Physical Exam  Constitutional: She appears well-developed and well-nourished. No distress.  HENT:  Head: Normocephalic and atraumatic.  Eyes:  Conjunctivae are normal.  Neck: Neck supple.  Cardiovascular: Regular rhythm.  No murmur heard. Tachycardic  Pulmonary/Chest: Effort normal. No respiratory distress.  Crackles heard on right lower side  Abdominal: Soft. There is no tenderness.  Musculoskeletal: She exhibits no edema.  Neurological: She is alert.  Skin: Skin is warm and dry.  Psychiatric: She has a normal mood and affect.  Nursing note and vitals reviewed.    UC Treatments / Results  Labs (all labs ordered are listed, but only abnormal results are displayed) Labs Reviewed - No data to display  EKG None  Radiology No results found.  Procedures Procedures (including critical care time)  Medications Ordered in UC Medications - No data to display  Initial Impression / Assessment and Plan / UC Course  I have  reviewed the triage vital signs and the nursing notes.  Pertinent labs & imaging results that were available during my care of the patient were reviewed by me and considered in my medical decision making (see chart for details).     Patient tachycardic in 140s, moderate pleural effusion present, EKG showing some ST elevation in 2 3 and aVF with slight depression in V2.  Given symptomatic pleural effusion, possible coronary involvement/ACS, will send patient via EMS to emergency room for further evaluation and treatment.  Discussed with Dr. Alvester Chou cardiologist, reviewed EKG and felt EKG was not a true STEMI.  Still sending down via [facility].  Stable upon discharge. Final Clinical Impressions(s) / UC Diagnoses   Final diagnoses:  Pleural effusion     Discharge Instructions     Please go to emergency room for further evaluation and treatment   ED Prescriptions    None     Controlled Substance Prescriptions [place] Controlled Substance Registry consulted? Not Applicable   Janith Lima, [place] 09/19/17 1106

## 2017-09-19 NOTE — Discharge Instructions (Signed)
Please go to emergency room for further evaluation and treatment 

## 2017-09-19 NOTE — ED Notes (Signed)
Pt placed on 2L [place] O2 

## 2017-09-19 NOTE — ED Triage Notes (Signed)
EKG done. Results shown to [employer], [place] immedietaly. Code STEMI called and patient to be transferred to ED ASAP.

## 2017-09-19 NOTE — ED Notes (Signed)
Notified karen cobb, rn-charge in ed

## 2017-09-19 NOTE — Progress Notes (Signed)
ANTICOAGULATION CONSULT NOTE - Initial Consult  Pharmacy Consult for heparin Indication: chest pain/ACS + AFib  No Known Allergies  Patient Measurements:   Heparin Dosing Weight: 77kg  Vital Signs: Temp: 97.5 F (36.4 C) (07/27 1113) Temp Source: Oral (07/27 1113) BP: 148/107 (07/27 1430) Pulse Rate: 136 (07/27 1430)  Labs: Recent Labs    09/19/17 1057 09/19/17 1122 09/19/17 1123  HGB 10.5* 8.8*  --   HCT 31.0* 28.4*  --   PLT  --  110*  --   CREATININE 1.50*  --  1.45*    CrCl cannot be calculated (Unknown ideal weight.).   Medical History: No past medical history on file.   Assessment: 57 yoF admitted with CP found to have new AFib RVR. Pharmacy consulted to start IV heparin. Pt on no OAC PTA, baseline Hgb low at 8.8.  Goal of Therapy:  Heparin level 0.3-0.7 units/ml Monitor platelets by anticoagulation protocol: Yes   Plan:  -Start IV heparin 4000 units x1 then 950 units/hr -Check 8hr heparin level -Daily CBC, heparin level  Arrie Senate, PharmD, BCPS Clinical Pharmacist [phone] Please check AMION for all [facility] Pharmacy numbers 09/19/2017

## 2017-09-20 ENCOUNTER — Other Ambulatory Visit (HOSPITAL_COMMUNITY): Payer: Self-pay

## 2017-09-20 DIAGNOSIS — R160 Hepatomegaly, not elsewhere classified: Secondary | ICD-10-CM

## 2017-09-20 DIAGNOSIS — N179 Acute kidney failure, unspecified: Secondary | ICD-10-CM

## 2017-09-20 DIAGNOSIS — M899 Disorder of bone, unspecified: Secondary | ICD-10-CM

## 2017-09-20 DIAGNOSIS — I4892 Unspecified atrial flutter: Secondary | ICD-10-CM

## 2017-09-20 DIAGNOSIS — D649 Anemia, unspecified: Secondary | ICD-10-CM

## 2017-09-20 DIAGNOSIS — J90 Pleural effusion, not elsewhere classified: Secondary | ICD-10-CM

## 2017-09-20 LAB — PROTIME-INR
INR: 1.91
Prothrombin Time: 21.7 seconds — ABNORMAL HIGH (ref 11.4–15.2)

## 2017-09-20 LAB — CBC
HCT: 28.5 % — ABNORMAL LOW (ref 36.0–46.0)
HEMATOCRIT: 32.4 % — AB (ref 36.0–46.0)
HEMOGLOBIN: 8.9 g/dL — AB (ref 12.0–15.0)
HEMOGLOBIN: 10.3 g/dL — AB (ref 12.0–15.0)
MCH: 27.2 pg (ref 26.0–34.0)
MCH: 27.5 pg (ref 26.0–34.0)
MCHC: 31.2 g/dL (ref 30.0–36.0)
MCHC: 31.8 g/dL (ref 30.0–36.0)
MCV: 86.4 fL (ref 78.0–100.0)
MCV: 87.2 fL (ref 78.0–100.0)
Platelets: 76 10*3/uL — ABNORMAL LOW (ref 150–400)
Platelets: 80 10*3/uL — ABNORMAL LOW (ref 150–400)
RBC: 3.27 MIL/uL — AB (ref 3.87–5.11)
RBC: 3.75 MIL/uL — AB (ref 3.87–5.11)
RDW: 20.3 % — ABNORMAL HIGH (ref 11.5–15.5)
RDW: 20.4 % — ABNORMAL HIGH (ref 11.5–15.5)
WBC: 12.9 10*3/uL — ABNORMAL HIGH (ref 4.0–10.5)
WBC: 8.1 10*3/uL (ref 4.0–10.5)

## 2017-09-20 LAB — IGG, IGA, IGM
IGG (IMMUNOGLOBIN G), SERUM: 405 mg/dL — AB (ref 700–1600)
IGM (IMMUNOGLOBULIN M), SRM: 8 mg/dL — AB (ref 26–217)

## 2017-09-20 LAB — BASIC METABOLIC PANEL
ANION GAP: 21 — AB (ref 5–15)
Anion gap: 24 — ABNORMAL HIGH (ref 5–15)
BUN: 22 mg/dL (ref 8–23)
BUN: 26 mg/dL — AB (ref 8–23)
CHLORIDE: 96 mmol/L — AB (ref 98–111)
CHLORIDE: 98 mmol/L (ref 98–111)
CO2: 14 mmol/L — AB (ref 22–32)
CO2: 14 mmol/L — ABNORMAL LOW (ref 22–32)
Calcium: 9.1 mg/dL (ref 8.9–10.3)
Calcium: 8.9 mg/dL (ref 8.9–10.3)
Creatinine, Ser: 2.11 mg/dL — ABNORMAL HIGH (ref 0.44–1.00)
Creatinine, Ser: 2.21 mg/dL — ABNORMAL HIGH (ref 0.44–1.00)
GFR calc Af Amer: 27 mL/min — ABNORMAL LOW (ref >60)
GFR calc Af Amer: 25 mL/min — ABNORMAL LOW (ref >60)
GFR calc non Af Amer: 23 mL/min — ABNORMAL LOW (ref >60)
GFR calc non Af Amer: 22 mL/min — ABNORMAL LOW (ref >60)
GLUCOSE: 91 mg/dL (ref 70–99)
GLUCOSE: 96 mg/dL (ref 70–99)
POTASSIUM: 5.4 mmol/L — AB (ref 3.5–5.1)
POTASSIUM: 5.2 mmol/L — AB (ref 3.5–5.1)
SODIUM: 133 mmol/L — AB (ref 135–145)
Sodium: 134 mmol/L — ABNORMAL LOW (ref 135–145)

## 2017-09-20 LAB — TROPONIN I
Troponin I: <0.03 ng/mL (ref <0.03)
Troponin I: <0.03 ng/mL (ref <0.03)

## 2017-09-20 LAB — ERYTHROPOIETIN: Erythropoietin: 313.9 m[IU]/mL — ABNORMAL HIGH (ref 2.6–18.5)

## 2017-09-20 LAB — HIV ANTIBODY (ROUTINE TESTING W REFLEX): HIV Screen 4th Generation wRfx: NONREACTIVE

## 2017-09-20 LAB — HEPARIN LEVEL (UNFRACTIONATED): Heparin Unfractionated: 0.12 IU/mL — ABNORMAL LOW (ref 0.30–0.70)

## 2017-09-20 LAB — BETA 2 MICROGLOBULIN, SERUM: Beta-2 Microglobulin: 6 mg/L — ABNORMAL HIGH (ref 0.6–2.4)

## 2017-09-20 MED ORDER — PROMETHAZINE HCL 25 MG/ML IJ SOLN
6.2500 mg | Freq: Four times a day (QID) | INTRAMUSCULAR | Status: DC | PRN
  Administered 2017-09-20 – 2017-09-24 (×2): 6.25 mg via INTRAVENOUS
  Filled 2017-09-20 (×2): qty 1

## 2017-09-20 MED ORDER — SODIUM CHLORIDE 0.9 % IV BOLUS
500.0000 mL | Freq: Once | INTRAVENOUS | Status: AC
  Administered 2017-09-20: 500 mL via INTRAVENOUS

## 2017-09-20 MED ORDER — SODIUM POLYSTYRENE SULFONATE 15 GM/60ML PO SUSP
30.0000 g | Freq: Once | ORAL | Status: AC
  Administered 2017-09-20: 30 g via ORAL
  Filled 2017-09-20: qty 120

## 2017-09-20 MED ORDER — AMIODARONE HCL 200 MG PO TABS
400.0000 mg | ORAL_TABLET | Freq: Every day | ORAL | Status: DC
  Administered 2017-09-20 – 2017-09-22 (×3): 400 mg via ORAL
  Filled 2017-09-20 (×3): qty 2

## 2017-09-20 MED ORDER — SODIUM CHLORIDE 0.9 % IV SOLN
INTRAVENOUS | Status: DC
  Administered 2017-09-20 (×2): via INTRAVENOUS
  Administered 2017-09-21: 100 mL via INTRAVENOUS
  Administered 2017-09-22: via INTRAVENOUS

## 2017-09-20 NOTE — Progress Notes (Signed)
ANTICOAGULATION CONSULT NOTE - Follow Up Consult  Pharmacy Consult for heparin Indication: chest pain/ACS and atrial fibrillation  Labs: Recent Labs    09/19/17 1057 09/19/17 1122 09/19/17 1123 09/19/17 1702 09/20/17 0005  HGB 10.5* 8.8*  --   --  10.3*  HCT 31.0* 28.4*  --   --  32.4*  PLT  --  110*  --   --  80*  HEPARINUNFRC  --   --   --   --  <0.10*  CREATININE 1.50*  --  1.45*  --   --   TROPONINI  --   --   --  <0.03  --     Assessment/Plan:  67yo female undetectable on heparin though gtt hung late and had only been running 3h when lab was drawn. Will continue gtt at current rate for now and recheck level.   Wynona Neat, PharmD, BCPS  09/20/2017,1:08 AM

## 2017-09-20 NOTE — Progress Notes (Signed)
[facility] PROGRESS NOTE  Debbie Chan WIO:[id number] DOB: 09-Oct-1950 DOA: 09/19/2017  PCP: [name], MD  Brief History/Interval Summary: 67 year old female visiting from [place] with no significant past medical history was initially taken to the urgent care center with complaints of left-sided chest pain, shortness of breath ongoing for the past 3 weeks.  She was seen by a local provider last week who ordered a chest x-ray for her symptoms which showed pleural effusion.  Before she could follow-up her symptoms worsened so she decided to come into the hospital.  She underwent CT angiogram of her chest which did not show PE but did show multiple lytic lesions concerning for either multiple myeloma or metastatic cancer.  She was also found to be in atrial flutter.  Hospitalized for further management.  Reason for Visit: Atrial flutter.  Multiple lytic lesions.  Consultants: Cardiology.  Procedures: None yet  Antibiotics: None  Subjective/Interval History: Patient with some complaints of nausea vomiting this morning.  Also complains of some difficulty breathing and left-sided chest pain.  ROS: Denies any headaches.  Objective:  Vital Signs  Vitals:   09/20/17 0343 09/20/17 0425 09/20/17 0909 09/20/17 0910  BP: 135/85   140/78  Pulse: 78  79   Resp: 18   (!) 25  Temp: (!) 97.4 F (36.3 C)     TempSrc: Oral     SpO2: 97%  97%   Weight:  84.4 kg (186 lb 1.1 oz)    Height:        Intake/Output Summary (Last 24 hours) at 09/20/2017 1023 Last data filed at 09/20/2017 3845 Gross per 24 hour  Intake 946.54 ml  Output -  Net 946.54 ml   Filed Weights   09/19/17 1710 09/20/17 0425  Weight: 71.9 kg (158 lb 8.2 oz) 84.4 kg (186 lb 1.1 oz)    General appearance: alert, cooperative, appears stated age and no distress Head: Normocephalic, without obvious abnormality, atraumatic Resp: Diminished air entry at the bases.  No definite crackles.  No wheezing or  rhonchi. Cardio: regular rate and rhythm, S1, S2 normal, no murmur, click, rub or gallop GI: Abdomen is soft.  Nondistended.  Vaguely tender diffusely without any rebound rigidity or guarding.  Hepatomegaly is appreciated on examination.  No splenomegaly appreciated. Extremities: extremities normal, atraumatic, no cyanosis or edema Pulses: 2+ and symmetric Neurologic: Awake alert.  No obvious focal neurological deficits.  Lab Results:  Data Reviewed: I have personally reviewed following labs and imaging studies  CBC: Recent Labs  Lab 09/19/17 1057 09/19/17 1122 09/20/17 0005 09/20/17 0632  WBC  --  7.0 8.1 12.9*  HGB 10.5* 8.8* 10.3* 8.9*  HCT 31.0* 28.4* 32.4* 28.5*  MCV  --  86.9 86.4 87.2  PLT  --  110* 80* 76*    Basic Metabolic Panel: Recent Labs  Lab 09/19/17 1057 09/19/17 1123 09/20/17 0632  NA 138 136 134*  K 4.3 4.4 5.4*  CL 100 101 96*  CO2  --  22 14*  GLUCOSE 112* 109* 91  BUN _0 CREATININE 1.50* 1.45* 2.11*  CALCIUM  --  9.2 9.1  MG  --  2.1  --     GFR: Estimated Creatinine Clearance: 29.4 mL/min (A) (by C-G formula based on SCr of 2.11 mg/dL (H)).  Liver Function Tests: Recent Labs  Lab 09/19/17 1123  AST 65*  ALT 42  ALKPHOS 69  BILITOT 0.7  PROT 10.8*  ALBUMIN 2.5*    Coagulation Profile: Recent  Labs  Lab 09/20/17 0632  INR 1.91    Cardiac Enzymes: Recent Labs  Lab 09/19/17 1702 09/20/17 0005 09/20/17 0632  TROPONINI <0.03 <0.03 <0.03    Thyroid Function Tests: Recent Labs    09/19/17 1632 09/19/17 1702  TSH 2.603  --   FREET4  --  1.13    Anemia Panel: Recent Labs    09/19/17 1632 09/19/17 1702  VITAMINB12  --  726  FOLATE 7.7  --   FERRITIN  --  463*  TIBC  --  218*  IRON  --  111  RETICCTPCT  --  2.9    Recent Results (from the past 240 hour(s))  MRSA PCR Screening     Status: None   Collection Time: 09/19/17  5:33 PM  Result Value Ref Range Status   MRSA by PCR NEGATIVE NEGATIVE Final     Comment:        The GeneXpert MRSA Assay (FDA approved for NASAL specimens only), is one component of a comprehensive MRSA colonization surveillance program. It is not intended to diagnose MRSA infection nor to guide or monitor treatment for MRSA infections. Performed at [facility], [place] [address] [place], [place] [address]       Radiology Studies: Ct Angio Chest Pe W And/or Wo Contrast  Result Date: 09/19/2017 CLINICAL DATA:  67 year old female with acute chest pain and shortness of breath for 3 weeks. EXAM: CT ANGIOGRAPHY CHEST WITH CONTRAST TECHNIQUE: Multidetector CT imaging of the chest was performed using the standard protocol during bolus administration of intravenous contrast. Multiplanar CT image reconstructions and MIPs were obtained to evaluate the vascular anatomy. CONTRAST:  52m ISOVUE-370 IOPAMIDOL (ISOVUE-370) INJECTION 76% COMPARISON:  09/19/2017 chest radiograph FINDINGS: Cardiovascular: This is a technically satisfactory study. No pulmonary emboli are identified. Moderate to marked cardiomegaly noted with RIGHT atrial enlargement. Coronary artery and aortic atherosclerotic calcifications identified. No pericardial effusion or thoracic aortic aneurysm. Mediastinum/Nodes: No enlarged mediastinal, hilar, or axillary lymph nodes. Thyroid gland, trachea, and esophagus demonstrate no significant findings. Lungs/Pleura: A moderate RIGHT pleural effusion with RIGHT LOWER lung atelectasis noted. This effusion may be loculated along the RIGHT lung base. No pulmonary nodule, mass or consolidation.  No pneumothorax. Upper Abdomen: Hypodense areas along the liver may represent subcapsular fluid/masses versus parenchymal abnormalities. A small amount of fluid adjacent to the spleen is identified. Musculoskeletal: Multiple lytic lesions of the visualized bones identified involving the spine, ribs, clavicles and sternum. A 3.5 x 7 cm soft tissue destructive mass involving the  anterolateral LEFT 7th rib is noted. Review of the MIP images confirms the above findings. IMPRESSION: 1. Multiple lytic lesions throughout the visualized bones with 3.5 x 7 cm soft tissue mass involving the LEFT 7th rib, worrisome for metastatic disease or myeloma. 2. Moderate RIGHT pleural effusion and RIGHT LOWER lung atelectasis. This effusion may be loculated along the RIGHT lung base. Malignant effusion not excluded given bony findings. 3. Hypodense areas along the liver which may represent subcapsular fluid/masses versus hepatic lesions. Consider dedicated abdominal and pelvic CT with contrast for further evaluation. 4. No evidence of pulmonary emboli 5. Moderate to marked cardiomegaly 6. Coronary artery and Aortic Atherosclerosis (ICD10-I70.0). Electronically Signed   By: JMargarette CanadaM.D.   On: 09/19/2017 14:38   Dg Chest Portable 1 View  Result Date: 09/19/2017 CLINICAL DATA:  Chest pain EXAM: PORTABLE CHEST 1 VIEW COMPARISON:  09/16/2017 FINDINGS: Cardiac shadow remains enlarged. Right basilar atelectasis and associated effusion is again identified stable  from the prior exam. Left lung is clear. No bony abnormality is noted. IMPRESSION: Right basilar atelectasis with associated effusions stable from the previous exam. Electronically Signed   By: Inez Catalina M.D.   On: 09/19/2017 11:43   Dg Bone Survey Met  Result Date: 09/19/2017 CLINICAL DATA:  Lytic lesions shown on prior chest CT EXAM: METASTATIC BONE SURVEY COMPARISON:  Chest CT performed earlier today FINDINGS: Possible small lytic lesions scattered throughout the calvarium. Lytic lesions noted in the mid to distal left clavicle and possible mid right clavicle. Scattered small lytic lesions throughout the humeri bilaterally and within the proximal to mid left radius and ulna. Small lytic lesion within the mid right radius. No focal lytic lesion or acute bony abnormality within the cervical, thoracic or lumbar spine. Mild degenerative facet  disease throughout the lumbar spine. Probable small lytic lesions within the proximal to midshaft of the left femur and possibly within the shafts of the left tibia and fibula. Lytic lesions scattered throughout the right tibia. Heart is borderline in size. Right pleural effusion with right lower lobe atelectasis. Left lung clear. Density projecting over the left lateral lower chest represents the soft tissue mass associated with the left 7th rib anteriorly. IMPRESSION: Numerous small lytic lesions scattered throughout the skull including the calvarium, bilateral clavicles, humeri, radius/ulna bilaterally, left femur, and bilateral tibia/fibula. Findings compatible with metastatic disease or myeloma. The sternum and rib lesions are better seen on chest CT. Soft tissue density over the left lower chest corresponds to the soft tissue mass associated with the anterior left 7th rib. Small to moderate right pleural effusion with right base atelectasis. Electronically Signed   By: Rolm Baptise M.D.   On: 09/19/2017 17:13     Medications:  Scheduled: . heparin  4,000 Units Intravenous Once  . sodium polystyrene  30 g Oral Once   Continuous: . sodium chloride    . amiodarone 30 mg/hr (09/20/17 0909)  . heparin 950 Units/hr (09/20/17 0909)  . sodium chloride     TGY:[id number] **OR** acetaminophen, bisacodyl, hydrALAZINE, HYDROcodone-acetaminophen, morphine injection, ondansetron **OR** ondansetron (ZOFRAN) IV, senna-docusate  Assessment/Plan:    Atrial flutter with RVR Patient seen by cardiology.  TSH normal.  Echocardiogram is pending.  Patient was started on amiodarone and heparin infusions.  She appears to have converted to sinus rhythm.  Further management per cardiology.  Multiple lytic lesions/hepatic lesions Proceed with ultrasound of the abdomen.  Unfortunately due to rising creatinine we cannot do a CT scan of her abdomen pelvis yet.  Bone survey revealed multiple lytic lesions.  LDH  noted to be elevated 117.  Other work-up is still pending including beta-2 microglobulin, light chains, quantitative immunoglobulins, SPEP UPEP.  She will likely need bone marrow biopsy.  Corrected calcium is 10.2.  AST was 65.  Bilirubin normal.  Total protein 10.8 with albumin of 2.5.  Discussed with Dr. Alen Blew with oncology.  He recommends a tissue diagnosis as findings not conclusively suggestive of multiple myeloma.  She could have a solid tumor.  He recommends biopsying the liver lesion or 1 of the skeletal lesions.  Right-sided pleural effusion Possibly related to the above.  May need thoracentesis.  Does not appear to be causing any respiratory discomfort or distress at this time.  Acute renal failure/Hyperkalemia Creatinine noted to be higher today compared to yesterday.  Potassium 5.4.  She will be given Kayexalate.  She did get contrast for her CT angiogram yesterday.  She is somewhat on the hypovolemic  side.  We will give her IV fluids.  Monitor urine output.  Follow-up on ultrasound.  UA showed large hemoglobin trace leukocytes rare bacteria 11-20 RBCs.  HIV nonreactive.  Normocytic anemia Anemia panel reviewed.  Ferritin 463.  B12 level 726.  Folate 7.7.  No evidence for overt bleeding.  Continue to watch.  Thrombocytopenia Platelet counts noted to be low but stable for the most part.  No evidence for bleeding.  Probably related to underlying malignancy.  Elevated blood pressure No known history of hypertension.  Continue to watch for now.  DVT Prophylaxis: Currently on heparin infusion    Code Status: Full code Family Communication: Discussed with the patient and her son Disposition Plan: Management as outlined above.    LOS: 1 day   [facility] Pager [phone] 09/20/2017, 10:23 AM  If 7PM-7AM, please contact night-coverage at [email or web address], password [facility]

## 2017-09-20 NOTE — Progress Notes (Addendum)
DAILY PROGRESS NOTE   Patient Name: Debbie Chan Date of Encounter: 09/20/2017  Chief Complaint   Abdominal pain and heaviness  Patient Profile   Debbie Chan is a 67 y.o. female with no prior medical history who is being seen today for the evaluation of chest pain and abnormal EKG at the request of Dr. Reather Converse.  Subjective   Converted to sinus rhythm overnight on amiodarone. On IV heparin. Needs oncology work-up for multiple skeletal lytic lesions.   Objective   Vitals:   09/20/17 0343 09/20/17 0425 09/20/17 0909 09/20/17 0910  BP: 135/85   140/78  Pulse: 78  79   Resp: 18   (!) 25  Temp: (!) 97.4 F (36.3 C)     TempSrc: Oral     SpO2: 97%  97%   Weight:  186 lb 1.1 oz (84.4 kg)    Height:        Intake/Output Summary (Last 24 hours) at 09/20/2017 1123 Last data filed at 09/20/2017 4010 Gross per 24 hour  Intake 946.54 ml  Output -  Net 946.54 ml   Filed Weights   09/19/17 1710 09/20/17 0425  Weight: 158 lb 8.2 oz (71.9 kg) 186 lb 1.1 oz (84.4 kg)    Physical Exam   General appearance: alert and no distress Neck: no carotid bruit, no JVD and thyroid not enlarged, symmetric, no tenderness/mass/nodules Lungs: clear to auscultation bilaterally Heart: regular rate and rhythm Abdomen: protuberant, firm Extremities: extremities normal, atraumatic, no cyanosis or edema Pulses: 2+ and symmetric Skin: Skin color, texture, turgor normal. No rashes or lesions Neurologic: Grossly normal Psych: Pleasant  Inpatient Medications    Scheduled Meds: . heparin  4,000 Units Intravenous Once  . sodium polystyrene  30 g Oral Once    Continuous Infusions: . sodium chloride 100 mL/hr at 09/20/17 1059  . amiodarone 30 mg/hr (09/20/17 0909)  . heparin 950 Units/hr (09/20/17 0909)  . sodium chloride 500 mL (09/20/17 1034)    PRN Meds: acetaminophen **OR** acetaminophen, bisacodyl, hydrALAZINE, HYDROcodone-acetaminophen, morphine injection, ondansetron **OR** ondansetron  (ZOFRAN) IV, senna-docusate   Labs   Results for orders placed or performed during the hospital encounter of 09/19/17 (from the past 48 hour(s))  CBC     Status: Abnormal   Collection Time: 09/19/17 11:22 AM  Result Value Ref Range   WBC 7.0 4.0 - 10.5 K/uL   RBC 3.27 (L) 3.87 - 5.11 MIL/uL   Hemoglobin 8.8 (L) 12.0 - 15.0 g/dL   HCT 28.4 (L) 36.0 - 46.0 %   MCV 86.9 78.0 - 100.0 fL   MCH 26.9 26.0 - 34.0 pg   MCHC 31.0 30.0 - 36.0 g/dL   RDW 20.4 (H) 11.5 - 15.5 %   Platelets 110 (L) 150 - 400 K/uL    Comment: SPECIMEN CHECKED FOR CLOTS REPEATED TO VERIFY PLATELET COUNT CONFIRMED BY SMEAR Performed at [facility], [address] [place], [place] [address]   Comprehensive metabolic panel     Status: Abnormal   Collection Time: 09/19/17 11:23 AM  Result Value Ref Range   Sodium 136 135 - 145 mmol/L   Potassium 4.4 3.5 - 5.1 mmol/L   Chloride 101 98 - 111 mmol/L   CO2 22 22 - 32 mmol/L   Glucose, Bld 109 (H) 70 - 99 mg/dL   BUN 15 8 - 23 mg/dL   Creatinine, Ser 1.45 (H) 0.44 - 1.00 mg/dL   Calcium 9.2 8.9 - 10.3 mg/dL   Total Protein  10.8 (H) 6.5 - 8.1 g/dL   Albumin 2.5 (L) 3.5 - 5.0 g/dL   AST 65 (H) 15 - 41 U/L   ALT 42 0 - 44 U/L   Alkaline Phosphatase 69 38 - 126 U/L   Total Bilirubin 0.7 0.3 - 1.2 mg/dL   GFR calc non Af Amer 36 (L) >60 mL/min   GFR calc Af Amer 42 (L) >60 mL/min    Comment: (NOTE) The eGFR has been calculated using the CKD EPI equation. This calculation has not been validated in all clinical situations. eGFR's persistently <60 mL/min signify possible Chronic Kidney Disease.    Anion gap 13 5 - 15    Comment: Performed at [facility] [address] [place], [place] [address]  Magnesium     Status: None   Collection Time: 09/19/17 11:23 AM  Result Value Ref Range   Magnesium 2.1 1.7 - 2.4 mg/dL    Comment: Performed at [facility] [address], [place], [place] [address]  TSH     Status: None   Collection Time:  09/19/17 11:23 AM  Result Value Ref Range   TSH 2.500 0.350 - 4.500 uIU/mL    Comment: Performed by a 3rd Generation assay with a functional sensitivity of <=0.01 uIU/mL. Performed at [facility], [place] [address], [place], [place] [address]   I-stat troponin, ED     Status: None   Collection Time: 09/19/17 11:29 AM  Result Value Ref Range   Troponin i, poc 0.00 0.00 - 0.08 ng/mL   Comment 3            Comment: Due to the release kinetics of cTnI, a negative result within the first hours of the onset of symptoms does not rule out myocardial infarction with certainty. If myocardial infarction is still suspected, repeat the test at appropriate intervals.   I-Stat CG4 Lactic Acid, ED     Status: Abnormal   Collection Time: 09/19/17 11:30 AM  Result Value Ref Range   Lactic Acid, Venous 2.21 (HH) 0.5 - 1.9 mmol/L  Brain natriuretic peptide     Status: Abnormal   Collection Time: 09/19/17  1:43 PM  Result Value Ref Range   B Natriuretic Peptide 476.7 (H) 0.0 - 100.0 pg/mL    Comment: Performed at [facility] [address] [place], [place] [address]  Type and screen     Status: None   Collection Time: 09/19/17  1:45 PM  Result Value Ref Range   ABO/RH(D) O POS    Antibody Screen NEG    Sample Expiration      09/22/2017 Performed at [facility], [place] [address] [place], [place] [address]   ABO/Rh     Status: None   Collection Time: 09/19/17  1:45 PM  Result Value Ref Range   ABO/RH(D)      O POS Performed at [facility] [address], [place], [place] [address]   I-Stat CG4 Lactic Acid, ED     Status: None   Collection Time: 09/19/17  1:57 PM  Result Value Ref Range   Lactic Acid, Venous 1.58 0.5 - 1.9 mmol/L  Save smear     Status: None   Collection Time: 09/19/17  4:32 PM  Result Value Ref Range   Smear Review SMEAR STAINED AND AVAILABLE FOR REVIEW     Comment: Performed at [facility], [place] [address], [place],  [address]  TSH     Status: None   Collection Time: 09/19/17  4:32 PM  Result Value Ref Range   TSH 2.603 0.350 - 4.500 uIU/mL    Comment: Performed by a 3rd Generation assay with a functional sensitivity of <=0.01 uIU/mL. Performed at [facility], [place] [address], [place], [place] [address]   Folate     Status: None   Collection Time: 09/19/17  4:32 PM  Result Value Ref Range   Folate 7.7 >5.9 ng/mL    Comment: Performed at [facility], [place] [address], [place], [place] [address]  HIV antibody (Routine Testing)     Status: None   Collection Time: 09/19/17  5:02 PM  Result Value Ref Range   HIV Screen 4th Generation wRfx Non Reactive Non Reactive    Comment: (NOTE) Performed At: [facility] [place], [place] [id number] Rush Farmer MD [id number]   Troponin I     Status: None   Collection Time: 09/19/17  5:02 PM  Result Value Ref Range   Troponin I <0.03 <0.03 ng/mL    Comment: Performed at [facility], [place] [address], [place], [place] [address]  T4, free     Status: None   Collection Time: 09/19/17  5:02 PM  Result Value Ref Range   Free T4 1.13 0.82 - 1.77 ng/dL    Comment: (NOTE) Biotin ingestion may interfere with free T4 tests. If the results are inconsistent with the TSH level, previous test results, or the clinical presentation, then consider biotin interference. If needed, order repeat testing after stopping biotin. Performed at [facility], [place] [address], [place], [place] [address]   Vitamin B12     Status: None   Collection Time: 09/19/17  5:02 PM  Result Value Ref Range   Vitamin B-12 726 180 - 914 pg/mL    Comment: (NOTE) This assay is not validated for testing neonatal or myeloproliferative syndrome specimens for Vitamin B12 levels. Performed at [facility], [place] [address] [place], [place] [address]   Iron and TIBC     Status: Abnormal   Collection Time: 09/19/17  5:02 PM  Result Value Ref Range   Iron  111 28 - 170 ug/dL   TIBC 218 (L) 250 - 450 ug/dL   Saturation Ratios 51 (H) 10.4 - 31.8 %   UIBC 107 ug/dL    Comment: Performed at [facility] [address], [place], [place] [address]  Ferritin     Status: Abnormal   Collection Time: 09/19/17  5:02 PM  Result Value Ref Range   Ferritin 463 (H) 11 - 307 ng/mL    Comment: Performed at [facility], [place] [address], [place], [place] [address]  Reticulocytes     Status: Abnormal   Collection Time: 09/19/17  5:02 PM  Result Value Ref Range   Retic Ct Pct 2.9 0.4 - 3.1 %   RBC. 3.22 (L) 3.87 - 5.11 MIL/uL   Retic Count, Absolute 93.4 19.0 - 186.0 K/uL    Comment: Performed at [facility] [address], [place], [place] [address]  Lactate dehydrogenase     Status: Abnormal   Collection Time: 09/19/17  5:02 PM  Result Value Ref Range   LDH 417 (H) 98 - 192 U/L    Comment: Performed at [facility] [address] [place], [place] [address]  Urinalysis, Routine w reflex microscopic  Status: Abnormal   Collection Time: 09/19/17  5:25 PM  Result Value Ref Range   Color, Urine YELLOW YELLOW   APPearance CLEAR CLEAR   Specific Gravity, Urine 1.011 1.005 - 1.030   pH 6.0 5.0 - 8.0   Glucose, UA NEGATIVE NEGATIVE mg/dL   Hgb urine dipstick LARGE (A) NEGATIVE   Bilirubin Urine NEGATIVE NEGATIVE   Ketones, ur NEGATIVE NEGATIVE mg/dL   Protein, ur 30 (A) NEGATIVE mg/dL   Nitrite NEGATIVE NEGATIVE   Leukocytes, UA TRACE (A) NEGATIVE   RBC / HPF 11-20 0 - 5 RBC/hpf   WBC, UA 0-5 0 - 5 WBC/hpf   Bacteria, UA RARE (A) NONE SEEN   Squamous Epithelial / LPF 0-5 0 - 5    Comment: Performed at [facility], [address], [place], [place] [address]  MRSA PCR Screening     Status: None   Collection Time: 09/19/17  5:33 PM  Result Value Ref Range   MRSA by PCR NEGATIVE NEGATIVE    Comment:        The GeneXpert MRSA Assay (FDA approved for NASAL specimens only), is one component of a comprehensive MRSA  colonization surveillance program. It is not intended to diagnose MRSA infection nor to guide or monitor treatment for MRSA infections. Performed at [facility], [place] [address] [place], [place] [address]   Heparin level (unfractionated)     Status: Abnormal   Collection Time: 09/20/17 12:05 AM  Result Value Ref Range   Heparin Unfractionated <0.10 (L) 0.30 - 0.70 IU/mL    Comment: (NOTE) If heparin results are below expected values, and patient dosage has  been confirmed, suggest follow up testing of antithrombin III levels. Performed at [facility], [place] [address] [place], [place] [address]   Troponin I     Status: None   Collection Time: 09/20/17 12:05 AM  Result Value Ref Range   Troponin I <0.03 <0.03 ng/mL    Comment: Performed at [facility] [address] [place], [place] [address]  CBC     Status: Abnormal   Collection Time: 09/20/17 12:05 AM  Result Value Ref Range   WBC 8.1 4.0 - 10.5 K/uL   RBC 3.75 (L) 3.87 - 5.11 MIL/uL   Hemoglobin 10.3 (L) 12.0 - 15.0 g/dL   HCT 32.4 (L) 36.0 - 46.0 %   MCV 86.4 78.0 - 100.0 fL   MCH 27.5 26.0 - 34.0 pg   MCHC 31.8 30.0 - 36.0 g/dL   RDW 20.4 (H) 11.5 - 15.5 %   Platelets 80 (L) 150 - 400 K/uL    Comment: CONSISTENT WITH PREVIOUS RESULT Performed at [facility], [place] [address], [place], [place] [address]   Troponin I     Status: None   Collection Time: 09/20/17  6:32 AM  Result Value Ref Range   Troponin I <0.03 <0.03 ng/mL    Comment: Performed at [facility] [address], [place], [place] [address]  CBC     Status: Abnormal   Collection Time: 09/20/17  6:32 AM  Result Value Ref Range   WBC 12.9 (H) 4.0 - 10.5 K/uL   RBC 3.27 (L) 3.87 - 5.11 MIL/uL   Hemoglobin 8.9 (L) 12.0 - 15.0 g/dL   HCT 28.5 (L) 36.0 - 46.0 %   MCV 87.2 78.0 - 100.0 fL   MCH 27.2 26.0 - 34.0 pg   MCHC 31.2 30.0 - 36.0  g/dL   RDW 20.3 (H) 11.5 - 15.5 %   Platelets 76 (L) 150 - 400 K/uL    Comment:  REPEATED TO VERIFY CONSISTENT WITH PREVIOUS RESULT Performed at [facility], [place] [address], [place], [place] [address]   Basic metabolic panel     Status: Abnormal   Collection Time: 09/20/17  6:32 AM  Result Value Ref Range   Sodium 134 (L) 135 - 145 mmol/L   Potassium 5.4 (H) 3.5 - 5.1 mmol/L   Chloride 96 (L) 98 - 111 mmol/L   CO2 14 (L) 22 - 32 mmol/L   Glucose, Bld 91 70 - 99 mg/dL   BUN 22 8 - 23 mg/dL   Creatinine, Ser 2.11 (H) 0.44 - 1.00 mg/dL   Calcium 9.1 8.9 - 10.3 mg/dL   GFR calc non Af Amer 23 (L) >60 mL/min   GFR calc Af Amer 27 (L) >60 mL/min    Comment: (NOTE) The eGFR has been calculated using the CKD EPI equation. This calculation has not been validated in all clinical situations. eGFR's persistently <60 mL/min signify possible Chronic Kidney Disease.    Anion gap 24 (H) 5 - 15    Comment: REPEATED TO VERIFY Performed at [facility] [address], [place], [place] [address]   Protime-INR     Status: Abnormal   Collection Time: 09/20/17  6:32 AM  Result Value Ref Range   Prothrombin Time 21.7 (H) 11.4 - 15.2 seconds   INR 1.91     Comment: Performed at [facility] [address] [place], [place] [address]  Heparin level (unfractionated)     Status: Abnormal   Collection Time: 09/20/17  6:32 AM  Result Value Ref Range   Heparin Unfractionated 0.12 (L) 0.30 - 0.70 IU/mL    Comment: (NOTE) If heparin results are below expected values, and patient dosage has  been confirmed, suggest follow up testing of antithrombin III levels. Performed at [facility], [place] [address], [place], [place] [address]     ECG   N/A  Telemetry   Sinus rhythm - Personally Reviewed  Radiology    Ct Angio Chest Pe W And/or Wo Contrast  Result Date: 09/19/2017 CLINICAL DATA:  67 year old female with acute chest pain and shortness of breath for 3 weeks. EXAM: CT ANGIOGRAPHY CHEST WITH CONTRAST TECHNIQUE: Multidetector CT imaging of the chest  was performed using the standard protocol during bolus administration of intravenous contrast. Multiplanar CT image reconstructions and MIPs were obtained to evaluate the vascular anatomy. CONTRAST:  10m ISOVUE-370 IOPAMIDOL (ISOVUE-370) INJECTION 76% COMPARISON:  09/19/2017 chest radiograph FINDINGS: Cardiovascular: This is a technically satisfactory study. No pulmonary emboli are identified. Moderate to marked cardiomegaly noted with RIGHT atrial enlargement. Coronary artery and aortic atherosclerotic calcifications identified. No pericardial effusion or thoracic aortic aneurysm. Mediastinum/Nodes: No enlarged mediastinal, hilar, or axillary lymph nodes. Thyroid gland, trachea, and esophagus demonstrate no significant findings. Lungs/Pleura: A moderate RIGHT pleural effusion with RIGHT LOWER lung atelectasis noted. This effusion may be loculated along the RIGHT lung base. No pulmonary nodule, mass or consolidation.  No pneumothorax. Upper Abdomen: Hypodense areas along the liver may represent subcapsular fluid/masses versus parenchymal abnormalities. A small amount of fluid adjacent to the spleen is identified. Musculoskeletal: Multiple lytic lesions of the visualized bones identified involving the spine, ribs, clavicles and sternum. A 3.5 x 7 cm soft tissue destructive mass involving the anterolateral LEFT 7th rib is noted. Review of the MIP  images confirms the above findings. IMPRESSION: 1. Multiple lytic lesions throughout the visualized bones with 3.5 x 7 cm soft tissue mass involving the LEFT 7th rib, worrisome for metastatic disease or myeloma. 2. Moderate RIGHT pleural effusion and RIGHT LOWER lung atelectasis. This effusion may be loculated along the RIGHT lung base. Malignant effusion not excluded given bony findings. 3. Hypodense areas along the liver which may represent subcapsular fluid/masses versus hepatic lesions. Consider dedicated abdominal and pelvic CT with contrast for further evaluation. 4.  No evidence of pulmonary emboli 5. Moderate to marked cardiomegaly 6. Coronary artery and Aortic Atherosclerosis (ICD10-I70.0). Electronically Signed   By: Margarette Canada M.D.   On: 09/19/2017 14:38   Dg Chest Portable 1 View  Result Date: 09/19/2017 CLINICAL DATA:  Chest pain EXAM: PORTABLE CHEST 1 VIEW COMPARISON:  09/16/2017 FINDINGS: Cardiac shadow remains enlarged. Right basilar atelectasis and associated effusion is again identified stable from the prior exam. Left lung is clear. No bony abnormality is noted. IMPRESSION: Right basilar atelectasis with associated effusions stable from the previous exam. Electronically Signed   By: Inez Catalina M.D.   On: 09/19/2017 11:43   Dg Bone Survey Met  Result Date: 09/19/2017 CLINICAL DATA:  Lytic lesions shown on prior chest CT EXAM: METASTATIC BONE SURVEY COMPARISON:  Chest CT performed earlier today FINDINGS: Possible small lytic lesions scattered throughout the calvarium. Lytic lesions noted in the mid to distal left clavicle and possible mid right clavicle. Scattered small lytic lesions throughout the humeri bilaterally and within the proximal to mid left radius and ulna. Small lytic lesion within the mid right radius. No focal lytic lesion or acute bony abnormality within the cervical, thoracic or lumbar spine. Mild degenerative facet disease throughout the lumbar spine. Probable small lytic lesions within the proximal to midshaft of the left femur and possibly within the shafts of the left tibia and fibula. Lytic lesions scattered throughout the right tibia. Heart is borderline in size. Right pleural effusion with right lower lobe atelectasis. Left lung clear. Density projecting over the left lateral lower chest represents the soft tissue mass associated with the left 7th rib anteriorly. IMPRESSION: Numerous small lytic lesions scattered throughout the skull including the calvarium, bilateral clavicles, humeri, radius/ulna bilaterally, left femur, and  bilateral tibia/fibula. Findings compatible with metastatic disease or myeloma. The sternum and rib lesions are better seen on chest CT. Soft tissue density over the left lower chest corresponds to the soft tissue mass associated with the anterior left 7th rib. Small to moderate right pleural effusion with right base atelectasis. Electronically Signed   By: Rolm Baptise M.D.   On: 09/19/2017 17:13    Cardiac Studies   Echo pending  Assessment   1. Principal Problem: 2.   Chest pain 3. Active Problems: 4.   Tachycardia 5.   Lytic bone lesions on xray 6.   Anemia 7.   AKI (acute kidney injury) ([place]) 8.   Pleural effusion 9.   Plan   1. Spontaneous conversion to NSR overnight on IV heparin and amiodarone. Will switch to oral amiodarone today. Ok to d/c IV heparin given short duration of aflutter and not an ideal anticoagulation candidate at this time pending work-up and treatment for malignancy. May need to consider DOAC at some point, especially if recurrent a-fib as CHADSVASC score of 2.  Time Spent Directly with Patient:  I have spent a total of 25 minutes with the patient reviewing hospital notes, telemetry, EKGs, labs and examining the patient as  well as establishing an assessment and plan that was discussed personally with the patient.  > 50% of time was spent in direct patient care.  Length of Stay:  LOS: 1 day   Pixie Casino, MD, [facility], [place] Director of the Advanced Lipid Disorders &  Cardiovascular Risk Reduction Clinic Diplomate of the American Board of Clinical Lipidology Attending Cardiologist  Direct Dial: [phone]  Fax: [phone]  Website:  [email or web address] [email or web address] Vernida Mcnicholas 09/20/2017, 11:23 AM

## 2017-09-21 ENCOUNTER — Inpatient Hospital Stay (HOSPITAL_COMMUNITY): Payer: Self-pay

## 2017-09-21 ENCOUNTER — Other Ambulatory Visit: Payer: Self-pay

## 2017-09-21 DIAGNOSIS — R072 Precordial pain: Secondary | ICD-10-CM

## 2017-09-21 DIAGNOSIS — D696 Thrombocytopenia, unspecified: Secondary | ICD-10-CM

## 2017-09-21 LAB — CBC
HEMATOCRIT: 25.9 % — AB (ref 36.0–46.0)
HEMOGLOBIN: 8.3 g/dL — AB (ref 12.0–15.0)
MCH: 27.2 pg (ref 26.0–34.0)
MCHC: 32 g/dL (ref 30.0–36.0)
MCV: 84.9 fL (ref 78.0–100.0)
Platelets: 72 10*3/uL — ABNORMAL LOW (ref 150–400)
RBC: 3.05 MIL/uL — AB (ref 3.87–5.11)
RDW: 20.2 % — ABNORMAL HIGH (ref 11.5–15.5)
WBC: 8 10*3/uL (ref 4.0–10.5)

## 2017-09-21 LAB — COMPREHENSIVE METABOLIC PANEL
ALBUMIN: 2.1 g/dL — AB (ref 3.5–5.0)
ALK PHOS: 78 U/L (ref 38–126)
ALT: 197 U/L — AB (ref 0–44)
AST: 389 U/L — AB (ref 15–41)
Anion gap: 13 (ref 5–15)
BILIRUBIN TOTAL: 0.9 mg/dL (ref 0.3–1.2)
BUN: 28 mg/dL — AB (ref 8–23)
CALCIUM: 8.3 mg/dL — AB (ref 8.9–10.3)
CO2: 21 mmol/L — ABNORMAL LOW (ref 22–32)
CREATININE: 2.2 mg/dL — AB (ref 0.44–1.00)
Chloride: 100 mmol/L (ref 98–111)
GFR calc Af Amer: 25 mL/min — ABNORMAL LOW (ref >60)
GFR, EST NON AFRICAN AMERICAN: 22 mL/min — AB (ref >60)
GLUCOSE: 101 mg/dL — AB (ref 70–99)
POTASSIUM: 4.5 mmol/L (ref 3.5–5.1)
Sodium: 134 mmol/L — ABNORMAL LOW (ref 135–145)
TOTAL PROTEIN: 9.5 g/dL — AB (ref 6.5–8.1)

## 2017-09-21 LAB — PROTEIN ELECTROPHORESIS, SERUM
A/G Ratio: 0.5 — ABNORMAL LOW (ref 0.7–1.7)
ALBUMIN ELP: 3.1 g/dL (ref 2.9–4.4)
Alpha-1-Globulin: 0.3 g/dL (ref 0.0–0.4)
Alpha-2-Globulin: 0.4 g/dL (ref 0.4–1.0)
Beta Globulin: 5.8 g/dL — ABNORMAL HIGH (ref 0.7–1.3)
Gamma Globulin: 0.3 g/dL — ABNORMAL LOW (ref 0.4–1.8)
Globulin, Total: 6.8 g/dL — ABNORMAL HIGH (ref 2.2–3.9)
M-SPIKE, %: 4.2 g/dL — AB
Total Protein ELP: 9.9 g/dL — ABNORMAL HIGH (ref 6.0–8.5)

## 2017-09-21 LAB — ECHOCARDIOGRAM COMPLETE
HEIGHTINCHES: 68 in
Weight: 3012.8 oz

## 2017-09-21 NOTE — Consult Note (Signed)
Chief Complaint: Patient was seen in consultation today for left 7th rib lesion biopsy Chief Complaint  Patient presents with  . Chest Pain   at the request of Dr Curly Rim   Supervising Physician: Markus Daft  Patient Status: [facility] - In-pt  History of Present Illness: Debbie Chan is a 67 y.o. female   From [place] well Left chest wall pain for weeks Worsening pain; SOB  CTA: IMPRESSION: 1. Multiple lytic lesions throughout the visualized bones with 3.5 x 7 cm soft tissue mass involving the LEFT 7th rib, worrisome for metastatic disease or myeloma. 2. Moderate RIGHT pleural effusion and RIGHT LOWER lung atelectasis. This effusion may be loculated along the RIGHT lung base. Malignant effusion not excluded given bony findings. 3. Hypodense areas along the liver which may represent subcapsular fluid/masses versus hepatic lesions. Consider dedicated abdominal and pelvic CT with contrast for further evaluation. 4. No evidence of pulmonary emboli 5. Moderate to marked cardiomegaly  Bone scan: IMPRESSION: Numerous small lytic lesions scattered throughout the skull including the calvarium, bilateral clavicles, humeri, radius/ulna bilaterally, left femur, and bilateral tibia/fibula. Findings compatible with metastatic disease or myeloma. The sternum and rib lesions are better seen on chest CT. Soft tissue density over the left lower chest corresponds to the soft tissue mass associated with the anterior left 7th rib.  Request for biopsy Dr Anselm Pancoast has reviewed imaging and approves procedure Scheduled for biopsy 7/30 in IR  No past medical history on file.  No past surgical history on file.  Allergies: Patient has no known allergies.  Medications: Prior to Admission medications   Medication Sig Start Date End Date Taking? Authorizing Provider  acetaminophen (TYLENOL) 500 MG tablet Take 500 mg by mouth every 6 (six) hours as needed for mild pain or  moderate pain.    Yes Emergency, Nurse, RN     No family history on file.  Social History   Socioeconomic History  . Marital status: Widowed    Spouse name: Not on file  . Number of children: Not on file  . Years of education: Not on file  . Highest education level: Not on file  Occupational History  . Not on file  Social Needs  . Financial resource strain: Not on file  . Food insecurity:    Worry: Not on file    Inability: Not on file  . Transportation needs:    Medical: Not on file    Non-medical: Not on file  Tobacco Use  . Smoking status: Never Smoker  . Smokeless tobacco: Never Used  Substance and Sexual Activity  . Alcohol use: Not on file  . Drug use: Not on file  . Sexual activity: Not on file  Lifestyle  . Physical activity:    Days per week: Not on file    Minutes per session: Not on file  . Stress: Not on file  Relationships  . Social connections:    Talks on phone: Not on file    Gets together: Not on file    Attends religious service: Not on file    Active member of club or organization: Not on file    Attends meetings of clubs or organizations: Not on file    Relationship status: Not on file  Other Topics Concern  . Not on file  Social History Narrative  . Not on file     Review of Systems: A 12 point ROS discussed and pertinent positives are indicated in the HPI above.  All other systems are negative.  Review of Systems  Constitutional: Positive for activity change. Negative for appetite change, fatigue and fever.  HENT: Negative for trouble swallowing.   Respiratory: Positive for shortness of breath. Negative for cough.   Cardiovascular: Negative for chest pain.       Left chest WALL pain  Gastrointestinal: Negative for abdominal pain and nausea.  Musculoskeletal: Positive for back pain.  Neurological: Negative for weakness.  Psychiatric/Behavioral: Negative for behavioral problems and confusion.    Vital Signs: BP (!) 109/59 (BP  Location: Right Arm)   Pulse 95   Temp 98.1 F (36.7 C) (Oral)   Resp (!) 22   Ht '5\' 8"'  (1.727 m)   Wt 188 lb 4.8 oz (85.4 kg)   SpO2 98%   BMI 28.63 kg/m   Physical Exam  Constitutional: She is oriented to person, place, and time.  Cardiovascular: Normal rate and regular rhythm.  Pulmonary/Chest: Effort normal and breath sounds normal.  Musculoskeletal: Normal range of motion.  Neurological: She is alert and oriented to person, place, and time.  Skin: Skin is warm and dry.  Psychiatric: She has a normal mood and affect. Her behavior is normal.  Vitals reviewed.   Imaging: Dg Chest 2 View  Result Date: 09/16/2017 CLINICAL DATA:  Anterior chest wall pain for 2 weeks with a nonproductive cough. EXAM: CHEST - 2 VIEW COMPARISON:  None. FINDINGS: The cardiac silhouette is partially obscured though appears borderline enlarged. There is a moderate-sized right pleural effusion with associated compressive atelectasis. Minimal left basilar opacity likely represents atelectasis. The upper lungs are clear bilaterally. No pneumothorax is identified. No acute osseous abnormality is seen. IMPRESSION: Moderate right pleural effusion with right greater than left basilar atelectasis. Electronically Signed   By: Logan Bores M.D.   On: 09/16/2017 15:47   Ct Angio Chest Pe W And/or Wo Contrast  Result Date: 09/19/2017 CLINICAL DATA:  67 year old female with acute chest pain and shortness of breath for 3 weeks. EXAM: CT ANGIOGRAPHY CHEST WITH CONTRAST TECHNIQUE: Multidetector CT imaging of the chest was performed using the standard protocol during bolus administration of intravenous contrast. Multiplanar CT image reconstructions and MIPs were obtained to evaluate the vascular anatomy. CONTRAST:  41m ISOVUE-370 IOPAMIDOL (ISOVUE-370) INJECTION 76% COMPARISON:  09/19/2017 chest radiograph FINDINGS: Cardiovascular: This is a technically satisfactory study. No pulmonary emboli are identified. Moderate to marked  cardiomegaly noted with RIGHT atrial enlargement. Coronary artery and aortic atherosclerotic calcifications identified. No pericardial effusion or thoracic aortic aneurysm. Mediastinum/Nodes: No enlarged mediastinal, hilar, or axillary lymph nodes. Thyroid gland, trachea, and esophagus demonstrate no significant findings. Lungs/Pleura: A moderate RIGHT pleural effusion with RIGHT LOWER lung atelectasis noted. This effusion may be loculated along the RIGHT lung base. No pulmonary nodule, mass or consolidation.  No pneumothorax. Upper Abdomen: Hypodense areas along the liver may represent subcapsular fluid/masses versus parenchymal abnormalities. A small amount of fluid adjacent to the spleen is identified. Musculoskeletal: Multiple lytic lesions of the visualized bones identified involving the spine, ribs, clavicles and sternum. A 3.5 x 7 cm soft tissue destructive mass involving the anterolateral LEFT 7th rib is noted. Review of the MIP images confirms the above findings. IMPRESSION: 1. Multiple lytic lesions throughout the visualized bones with 3.5 x 7 cm soft tissue mass involving the LEFT 7th rib, worrisome for metastatic disease or myeloma. 2. Moderate RIGHT pleural effusion and RIGHT LOWER lung atelectasis. This effusion may be loculated along the RIGHT lung base. Malignant effusion not excluded given bony  findings. 3. Hypodense areas along the liver which may represent subcapsular fluid/masses versus hepatic lesions. Consider dedicated abdominal and pelvic CT with contrast for further evaluation. 4. No evidence of pulmonary emboli 5. Moderate to marked cardiomegaly 6. Coronary artery and Aortic Atherosclerosis (ICD10-I70.0). Electronically Signed   By: Margarette Canada M.D.   On: 09/19/2017 14:38   US Abdomen Complete  Result Date: 09/21/2017 CLINICAL DATA:  67 year old female with a history of acute renal failure. Recent bone survey demonstrates evidence of metastatic disease or myeloma EXAM: ABDOMEN  ULTRASOUND COMPLETE COMPARISON:  CT chest 09/19/2017, bone survey 09/19/2017 FINDINGS: Gallbladder: Circumferential gallbladder wall thickening, with no hyperechoic material within the gallbladder lumen. Negative sonographic Murphy's sign. No pericholecystic fluid. Common bile duct: Diameter: 2 mm-3 mm Liver: Increased echogenicity of the liver. The surface does not appear nodular. 1 cm anechoic cystic structure in the left liver with through transmission and no internal complexity. Cystic lesion of the right liver, towards the dome of the liver measures 3.9 cm x 3.9 cm x 4.0 cm. Through transmission with no significant internal complexity. No associated flow. IVC: No abnormality visualized. Pancreas: Visualized portion unremarkable. Spleen: Size and appearance within normal limits. Right Kidney: Length: 14.0 cm. No hydronephrosis. Echogenicity somewhat increased. Several hypoechoic lesions of the right kidney involving the cortex. The largest 2 measure 3.6 cm x 2.7 cm x 3.7 cm and 2.7 cm x 2.5 cm x 2.3 cm. No internal flow and there is posterior Q stick enhancement. The more medial demonstrates a poorly defined margin. Left Kidney: Length: 16.3 cm. No hydronephrosis. Echogenicity similar to the right. Anechoic lesion at the inferior left kidney measures 6.5 cm x 4.7 cm x 5.4 cm. Through transmission with no significant internal complexity. Additional lesion on the left measures 4.8 cm x 5.6 cm x 5.5 cm. Abdominal aorta: No aneurysm visualized. Other findings: None. IMPRESSION: No hydronephrosis of the bilateral kidneys. Echogenicity of the bilateral kidneys is somewhat increased, suggesting chronic medical renal disease. Bilateral kidneys demonstrate cystic lesions. While most likely benign, there is a right-sided lesion with a poorly defined wall. Further evaluation with contrast-enhanced CT or MR may be useful. Circumferential gallbladder wall thickening with negative sonographic Murphy's sign. This finding may  be seen with chronic liver disease or potentially right-sided heart failure. Correlation with presentation and lab values may be useful. The partially visualized lesions of the liver are not well characterized on the current ultrasound. The lesion at the liver dome appears cystic, however, correlation with contrast-enhanced CT or MR is recommended. Electronically Signed   By: Corrie Mckusick D.O.   On: 09/21/2017 07:08   Dg Chest Portable 1 View  Result Date: 09/19/2017 CLINICAL DATA:  Chest pain EXAM: PORTABLE CHEST 1 VIEW COMPARISON:  09/16/2017 FINDINGS: Cardiac shadow remains enlarged. Right basilar atelectasis and associated effusion is again identified stable from the prior exam. Left lung is clear. No bony abnormality is noted. IMPRESSION: Right basilar atelectasis with associated effusions stable from the previous exam. Electronically Signed   By: Inez Catalina M.D.   On: 09/19/2017 11:43   Dg Bone Survey Met  Result Date: 09/19/2017 CLINICAL DATA:  Lytic lesions shown on prior chest CT EXAM: METASTATIC BONE SURVEY COMPARISON:  Chest CT performed earlier today FINDINGS: Possible small lytic lesions scattered throughout the calvarium. Lytic lesions noted in the mid to distal left clavicle and possible mid right clavicle. Scattered small lytic lesions throughout the humeri bilaterally and within the proximal to mid left radius and ulna. Small  lytic lesion within the mid right radius. No focal lytic lesion or acute bony abnormality within the cervical, thoracic or lumbar spine. Mild degenerative facet disease throughout the lumbar spine. Probable small lytic lesions within the proximal to midshaft of the left femur and possibly within the shafts of the left tibia and fibula. Lytic lesions scattered throughout the right tibia. Heart is borderline in size. Right pleural effusion with right lower lobe atelectasis. Left lung clear. Density projecting over the left lateral lower chest represents the soft tissue  mass associated with the left 7th rib anteriorly. IMPRESSION: Numerous small lytic lesions scattered throughout the skull including the calvarium, bilateral clavicles, humeri, radius/ulna bilaterally, left femur, and bilateral tibia/fibula. Findings compatible with metastatic disease or myeloma. The sternum and rib lesions are better seen on chest CT. Soft tissue density over the left lower chest corresponds to the soft tissue mass associated with the anterior left 7th rib. Small to moderate right pleural effusion with right base atelectasis. Electronically Signed   By: Rolm Baptise M.D.   On: 09/19/2017 17:13    Labs:  CBC: Recent Labs    09/19/17 1122 09/20/17 0005 09/20/17 0632 09/21/17 0231  WBC 7.0 8.1 12.9* 8.0  HGB 8.8* 10.3* 8.9* 8.3*  HCT 28.4* 32.4* 28.5* 25.9*  PLT 110* 80* 76* 72*    COAGS: Recent Labs    09/20/17 0632  INR 1.91    BMP: Recent Labs    09/19/17 1123 09/20/17 0632 09/20/17 1822 09/21/17 0231  NA 136 134* 133* 134*  K 4.4 5.4* 5.2* 4.5  CL 101 96* 98 100  CO2 22 14* 14* 21*  GLUCOSE 109* 91 96 101*  BUN 15 22 26* 28*  CALCIUM 9.2 9.1 8.9 8.3*  CREATININE 1.45* 2.11* 2.21* 2.20*  GFRNONAA 36* 23* 22* 22*  GFRAA 42* 27* 25* 25*    LIVER FUNCTION TESTS: Recent Labs    09/19/17 1123 09/21/17 0231  BILITOT 0.7 0.9  AST 65* 389*  ALT 42 197*  ALKPHOS 69 78  PROT 10.8* 9.5*  ALBUMIN 2.5* 2.1*    TUMOR MARKERS: No results for input(s): AFPTM, CEA, CA199, CHROMGRNA in the last 8760 hours.  Assessment and Plan:  Worsening left chest wall pain SOB; weakness Imaging reveals bony lesions; liver lesions Scheduled for left 7th rib lesion biopsy in am Risks and benefits discussed with the patient including, but not limited to bleeding, infection, damage to adjacent structures or low yield requiring additional tests.  All of the patient's questions were answered, patient is agreeable to proceed. Consent signed and in chart.   Thank you  for this interesting consult.  I greatly enjoyed meeting Debbie Chan and look forward to participating in their care.  A copy of this report was sent to the requesting provider on this date.  Electronically Signed: Lavonia Drafts, PA-C 09/21/2017, 1:51 PM   I spent a total of 40 Minutes    in face to face in clinical consultation, greater than 50% of which was counseling/coordinating care for left 7th rib lesion biopsy

## 2017-09-21 NOTE — Progress Notes (Signed)
Progress Note  Patient Name: Debbie Chan Date of Encounter: 09/21/2017  Primary Cardiologist: No primary care provider on file.   Subjective   No acute events overnight. Reviewed results of echo with her today. All questions answered, no acute concerns at this time.  Inpatient Medications    Scheduled Meds: . amiodarone  400 mg Oral Daily  . heparin  4,000 Units Intravenous Once   Continuous Infusions: . sodium chloride 100 mL/hr at 09/21/17 0400   PRN Meds: acetaminophen **OR** acetaminophen, bisacodyl, hydrALAZINE, HYDROcodone-acetaminophen, morphine injection, promethazine, senna-docusate   Vital Signs    Vitals:   09/20/17 2329 09/21/17 0304 09/21/17 0728 09/21/17 1131  BP: (!) 145/85 140/81 135/78 (!) 109/59  Pulse: 95 96 96 95  Resp: 18 (!) 24 20 (!) 22  Temp: 98.7 F (37.1 C) 98.3 F (36.8 C) 98.2 F (36.8 C) 98.1 F (36.7 C)  TempSrc: Axillary Oral Oral Oral  SpO2: 96% 92% 93% 98%  Weight:  188 lb 4.8 oz (85.4 kg)    Height:        Intake/Output Summary (Last 24 hours) at 09/21/2017 1518 Last data filed at 09/21/2017 0800 Gross per 24 hour  Intake 1784.75 ml  Output -  Net 1784.75 ml   Filed Weights   09/19/17 1710 09/20/17 0425 09/21/17 0304  Weight: 158 lb 8.2 oz (71.9 kg) 186 lb 1.1 oz (84.4 kg) 188 lb 4.8 oz (85.4 kg)    Telemetry     normal sinus rhythm- Personally Reviewed  ECG    09/19/17 tachycardia, likely fib vs flutter - Personally Reviewed  Physical Exam   GEN: No acute distress.   Neck: supple, no JVD Cardiac: regular S1 and S2, no murmurs, rubs, or gallops.  Respiratory: Clear to auscultation bilaterally. GI: distended, firm but not overly tender. MS: No edema; No deformity. Neuro:  Nonfocal, moves all limbs independently Psych: Normal affect   Labs    Chemistry Recent Labs  Lab 09/19/17 1123 09/20/17 0632 09/20/17 1822 09/21/17 0231  NA 136 134* 133* 134*  K 4.4 5.4* 5.2* 4.5  CL 101 96* 98 100  CO2 22 14* 14*  21*  GLUCOSE 109* 91 96 101*  BUN 15 22 26* 28*  CREATININE 1.45* 2.11* 2.21* 2.20*  CALCIUM 9.2 9.1 8.9 8.3*  PROT 10.8*  --   --  9.5*  ALBUMIN 2.5*  --   --  2.1*  AST 65*  --   --  389*  ALT 42  --   --  197*  ALKPHOS 69  --   --  78  BILITOT 0.7  --   --  0.9  GFRNONAA 36* 23* 22* 22*  GFRAA 42* 27* 25* 25*  ANIONGAP 13 24* 21* 13     Hematology Recent Labs  Lab 09/20/17 0005 09/20/17 0632 09/21/17 0231  WBC 8.1 12.9* 8.0  RBC 3.75* 3.27* 3.05*  HGB 10.3* 8.9* 8.3*  HCT 32.4* 28.5* 25.9*  MCV 86.4 87.2 84.9  MCH 27.5 27.2 27.2  MCHC 31.8 31.2 32.0  RDW 20.4* 20.3* 20.2*  PLT 80* 76* 72*    Cardiac Enzymes Recent Labs  Lab 09/19/17 1702 09/20/17 0005 09/20/17 0632  TROPONINI <0.03 <0.03 <0.03    Recent Labs  Lab 09/19/17 1129  TROPIPOC 0.00     BNP Recent Labs  Lab 09/19/17 1343  BNP 476.7*     DDimer No results for input(s): DDIMER in the last 168 hours.   Radiology    US  Abdomen Complete  Result Date: 09/21/2017 CLINICAL DATA:  67 year old female with a history of acute renal failure. Recent bone survey demonstrates evidence of metastatic disease or myeloma EXAM: ABDOMEN ULTRASOUND COMPLETE COMPARISON:  CT chest 09/19/2017, bone survey 09/19/2017 FINDINGS: Gallbladder: Circumferential gallbladder wall thickening, with no hyperechoic material within the gallbladder lumen. Negative sonographic Murphy's sign. No pericholecystic fluid. Common bile duct: Diameter: 2 mm-3 mm Liver: Increased echogenicity of the liver. The surface does not appear nodular. 1 cm anechoic cystic structure in the left liver with through transmission and no internal complexity. Cystic lesion of the right liver, towards the dome of the liver measures 3.9 cm x 3.9 cm x 4.0 cm. Through transmission with no significant internal complexity. No associated flow. IVC: No abnormality visualized. Pancreas: Visualized portion unremarkable. Spleen: Size and appearance within normal limits.  Right Kidney: Length: 14.0 cm. No hydronephrosis. Echogenicity somewhat increased. Several hypoechoic lesions of the right kidney involving the cortex. The largest 2 measure 3.6 cm x 2.7 cm x 3.7 cm and 2.7 cm x 2.5 cm x 2.3 cm. No internal flow and there is posterior Q stick enhancement. The more medial demonstrates a poorly defined margin. Left Kidney: Length: 16.3 cm. No hydronephrosis. Echogenicity similar to the right. Anechoic lesion at the inferior left kidney measures 6.5 cm x 4.7 cm x 5.4 cm. Through transmission with no significant internal complexity. Additional lesion on the left measures 4.8 cm x 5.6 cm x 5.5 cm. Abdominal aorta: No aneurysm visualized. Other findings: None. IMPRESSION: No hydronephrosis of the bilateral kidneys. Echogenicity of the bilateral kidneys is somewhat increased, suggesting chronic medical renal disease. Bilateral kidneys demonstrate cystic lesions. While most likely benign, there is a right-sided lesion with a poorly defined wall. Further evaluation with contrast-enhanced CT or MR may be useful. Circumferential gallbladder wall thickening with negative sonographic Murphy's sign. This finding may be seen with chronic liver disease or potentially right-sided heart failure. Correlation with presentation and lab values may be useful. The partially visualized lesions of the liver are not well characterized on the current ultrasound. The lesion at the liver dome appears cystic, however, correlation with contrast-enhanced CT or MR is recommended. Electronically Signed   By: Corrie Mckusick D.O.   On: 09/21/2017 07:08   Dg Bone Survey Met  Result Date: 09/19/2017 CLINICAL DATA:  Lytic lesions shown on prior chest CT EXAM: METASTATIC BONE SURVEY COMPARISON:  Chest CT performed earlier today FINDINGS: Possible small lytic lesions scattered throughout the calvarium. Lytic lesions noted in the mid to distal left clavicle and possible mid right clavicle. Scattered small lytic lesions  throughout the humeri bilaterally and within the proximal to mid left radius and ulna. Small lytic lesion within the mid right radius. No focal lytic lesion or acute bony abnormality within the cervical, thoracic or lumbar spine. Mild degenerative facet disease throughout the lumbar spine. Probable small lytic lesions within the proximal to midshaft of the left femur and possibly within the shafts of the left tibia and fibula. Lytic lesions scattered throughout the right tibia. Heart is borderline in size. Right pleural effusion with right lower lobe atelectasis. Left lung clear. Density projecting over the left lateral lower chest represents the soft tissue mass associated with the left 7th rib anteriorly. IMPRESSION: Numerous small lytic lesions scattered throughout the skull including the calvarium, bilateral clavicles, humeri, radius/ulna bilaterally, left femur, and bilateral tibia/fibula. Findings compatible with metastatic disease or myeloma. The sternum and rib lesions are better seen on chest CT. Soft tissue  density over the left lower chest corresponds to the soft tissue mass associated with the anterior left 7th rib. Small to moderate right pleural effusion with right base atelectasis. Electronically Signed   By: Rolm Baptise M.D.   On: 09/19/2017 17:13    Cardiac Studies   Left ventricle: The cavity size was normal. There was mild focal   basal hypertrophy of the septum. Systolic function was normal.   The estimated ejection fraction was in the range of 55% to 60%.   Wall motion was normal; there were no regional wall motion   abnormalities. Left ventricular diastolic function parameters   were normal. - Ventricular septum: The contour showed diastolic flattening and   systolic flattening. These changes are consistent with RV volume   and pressure overload. - Aortic valve: Trileaflet; normal thickness, moderately calcified   leaflets. There was trivial regurgitation. - Mitral valve: There  was mild regurgitation. - Left atrium: The atrium was mildly dilated. - Right ventricle: The cavity size was moderately dilated. Wall   thickness was normal. Systolic function was moderately reduced. - Right atrium: The atrium was massively dilated. - Tricuspid valve: There was moderate-severe regurgitation. - Pulmonic valve: There was trivial regurgitation. - Pulmonary arteries: PA peak pressure: 37 mm Hg (S).  Impressions:  - The right ventricular systolic pressure was increased consistent   with mild pulmonary hypertension.  Patient Profile     67 y.o. female without prior medical history who present with left sided chest pressure/chest wall pain and shortness of breath. Found to have multiple lytic lesions, including at the rib cage, concerning for cancer vs. Myeloma.  Assessment & Plan    Atrial flutter: now converted to NSR.  -Continue oral amiodarone.  -ChadsVasc=2 for age, gender. Given pending workup, including biopsy, ok to hold heparin during workup. Consider DOAC once workup completed.   RV volume/pressure overload: diastolic flattening, moderate-severe TR, PASP 37 mmHg, pleural effusions (no pericardial noted) -monitor volume status. Once oncology workup complete can consider re-evaluation and possibly RHC if shortness of breath persists.   Time Spent Directly with Patient: I have spent a total of 25 minutes with the patient reviewing hospital notes, telemetry, EKGs, labs and examining the patient as well as establishing an assessment and plan that was discussed personally with the patient.  > 50% of time was spent in direct patient care.  Length of Stay:  LOS: 2 days   Buford Dresser, MD, PhD [facility]   09/21/2017, 3:18 PM  [facility] will sign off.   Medication Recommendations:  Amiodarone oral Other recommendations (labs, testing, etc):  Will need PFTs, TFTs, LFTs followed as outpatient Follow up as an outpatient:  With [facility] (new patient, seen by Bensimhon/Hilty over the weekend)  For questions or updates, please contact [facility] Please consult [email or web address] for contact info under Cardiology/STEMI.

## 2017-09-21 NOTE — Progress Notes (Signed)
[facility] PROGRESS NOTE  Anuja Manka HHI:[id number] DOB: [birth year] DOA: 09/19/2017  PCP: [name], MD  Brief History/Interval Summary: 67 year old female visiting from [place] with no significant past medical history was initially taken to the urgent care center with complaints of left-sided chest pain, shortness of breath ongoing for the past 3 weeks.  She was seen by a local provider last week who ordered a chest x-ray for her symptoms which showed pleural effusion.  Before she could follow-up her symptoms worsened so she decided to come into the hospital.  She underwent CT angiogram of her chest which did not show PE but did show multiple lytic lesions concerning for either multiple myeloma or metastatic cancer.  She was also found to be in atrial flutter.  Hospitalized for further management.  Reason for Visit: Atrial flutter.  Multiple lytic lesions.  Consultants: Cardiology.  Phone discussion with medical oncology.  Procedures:   Transthoracic echocardiogram Pending  Antibiotics: None  Subjective/Interval History: Patient with some nausea and vomiting yesterday but none overnight.  Denies any pain currently.  Her cousin is at the bedside.    ROS: Denies any headaches  Objective:  Vital Signs  Vitals:   09/20/17 2000 09/20/17 2329 09/21/17 0304 09/21/17 0728  BP: (!) 153/86 (!) 145/85 140/81 135/78  Pulse:  95 96 96  Resp: (!) 21 18 (!) 24 20  Temp: 98.3 F (36.8 C) 98.7 F (37.1 C) 98.3 F (36.8 C) 98.2 F (36.8 C)  TempSrc: Oral Axillary Oral Oral  SpO2:  96% 92% 93%  Weight:   85.4 kg (188 lb 4.8 oz)   Height:        Intake/Output Summary (Last 24 hours) at 09/21/2017 1007 Last data filed at 09/21/2017 0800 Gross per 24 hour  Intake 2284.75 ml  Output -  Net 2284.75 ml   Filed Weights   09/19/17 1710 09/20/17 0425 09/21/17 0304  Weight: 71.9 kg (158 lb 8.2 oz) 84.4 kg (186 lb 1.1 oz) 85.4 kg (188 lb 4.8 oz)    General appearance:  Patient is awake alert.  In no distress. Resp: Diminished air entry at the bases.  But mostly clear to auscultation.  No wheezing rales or rhonchi. Cardio: S1-S2 is normal regular.  No S3-S4.  No rubs murmurs or bruit. GI: Abdomen is soft.  Nontender nondistended.  Liver edge is palpable.  No splenomegaly appreciated.   Extremities: No edema Pulses: 2+ and symmetric Neurologic: No obvious focal neurological deficits appreciated.  Lab Results:  Data Reviewed: I have personally reviewed following labs and imaging studies  CBC: Recent Labs  Lab 09/19/17 1057 09/19/17 1122 09/20/17 0005 09/20/17 0632 09/21/17 0231  WBC  --  7.0 8.1 12.9* 8.0  HGB 10.5* 8.8* 10.3* 8.9* 8.3*  HCT 31.0* 28.4* 32.4* 28.5* 25.9*  MCV  --  86.9 86.4 87.2 84.9  PLT  --  110* 80* 76* 72*    Basic Metabolic Panel: Recent Labs  Lab 09/19/17 1057 09/19/17 1123 09/20/17 0632 09/20/17 1822 09/21/17 0231  NA 138 136 134* 133* 134*  K 4.3 4.4 5.4* 5.2* 4.5  CL 100 101 96* 98 100  CO2  --  22 14* 14* 21*  GLUCOSE 112* 109* 91 96 101*  BUN '17 15 22 ' 26* 28*  CREATININE 1.50* 1.45* 2.11* 2.21* 2.20*  CALCIUM  --  9.2 9.1 8.9 8.3*  MG  --  2.1  --   --   --     GFR: Estimated Creatinine Clearance:  28.4 mL/min (A) (by C-G formula based on SCr of 2.2 mg/dL (H)).  Liver Function Tests: Recent Labs  Lab 09/19/17 1123 09/21/17 0231  AST 65* 389*  ALT 42 197*  ALKPHOS 69 78  BILITOT 0.7 0.9  PROT 10.8* 9.5*  ALBUMIN 2.5* 2.1*    Coagulation Profile: Recent Labs  Lab 09/20/17 0632  INR 1.91    Cardiac Enzymes: Recent Labs  Lab 09/19/17 1702 09/20/17 0005 09/20/17 0632  TROPONINI <0.03 <0.03 <0.03    Thyroid Function Tests: Recent Labs    09/19/17 1632 09/19/17 1702  TSH 2.603  --   FREET4  --  1.13    Anemia Panel: Recent Labs    09/19/17 1632 09/19/17 1702  VITAMINB12  --  726  FOLATE 7.7  --   FERRITIN  --  463*  TIBC  --  218*  IRON  --  111  RETICCTPCT  --  2.9     Recent Results (from the past 240 hour(s))  MRSA PCR Screening     Status: None   Collection Time: 09/19/17  5:33 PM  Result Value Ref Range Status   MRSA by PCR NEGATIVE NEGATIVE Final    Comment:        The GeneXpert MRSA Assay (FDA approved for NASAL specimens only), is one component of a comprehensive MRSA colonization surveillance program. It is not intended to diagnose MRSA infection nor to guide or monitor treatment for MRSA infections. Performed at [facility], [place] [address] [place], [place] [address]       Radiology Studies: Ct Angio Chest Pe W And/or Wo Contrast  Result Date: 09/19/2017 CLINICAL DATA:  67 year old female with acute chest pain and shortness of breath for 3 weeks. EXAM: CT ANGIOGRAPHY CHEST WITH CONTRAST TECHNIQUE: Multidetector CT imaging of the chest was performed using the standard protocol during bolus administration of intravenous contrast. Multiplanar CT image reconstructions and MIPs were obtained to evaluate the vascular anatomy. CONTRAST:  33m ISOVUE-370 IOPAMIDOL (ISOVUE-370) INJECTION 76% COMPARISON:  09/19/2017 chest radiograph FINDINGS: Cardiovascular: This is a technically satisfactory study. No pulmonary emboli are identified. Moderate to marked cardiomegaly noted with RIGHT atrial enlargement. Coronary artery and aortic atherosclerotic calcifications identified. No pericardial effusion or thoracic aortic aneurysm. Mediastinum/Nodes: No enlarged mediastinal, hilar, or axillary lymph nodes. Thyroid gland, trachea, and esophagus demonstrate no significant findings. Lungs/Pleura: A moderate RIGHT pleural effusion with RIGHT LOWER lung atelectasis noted. This effusion may be loculated along the RIGHT lung base. No pulmonary nodule, mass or consolidation.  No pneumothorax. Upper Abdomen: Hypodense areas along the liver may represent subcapsular fluid/masses versus parenchymal abnormalities. A small amount of fluid adjacent to the spleen is  identified. Musculoskeletal: Multiple lytic lesions of the visualized bones identified involving the spine, ribs, clavicles and sternum. A 3.5 x 7 cm soft tissue destructive mass involving the anterolateral LEFT 7th rib is noted. Review of the MIP images confirms the above findings. IMPRESSION: 1. Multiple lytic lesions throughout the visualized bones with 3.5 x 7 cm soft tissue mass involving the LEFT 7th rib, worrisome for metastatic disease or myeloma. 2. Moderate RIGHT pleural effusion and RIGHT LOWER lung atelectasis. This effusion may be loculated along the RIGHT lung base. Malignant effusion not excluded given bony findings. 3. Hypodense areas along the liver which may represent subcapsular fluid/masses versus hepatic lesions. Consider dedicated abdominal and pelvic CT with contrast for further evaluation. 4. No evidence of pulmonary emboli 5. Moderate to marked cardiomegaly 6. Coronary artery and Aortic  Atherosclerosis (ICD10-I70.0). Electronically Signed   By: Margarette Canada M.D.   On: 09/19/2017 14:38   US Abdomen Complete  Result Date: 09/21/2017 CLINICAL DATA:  67 year old female with a history of acute renal failure. Recent bone survey demonstrates evidence of metastatic disease or myeloma EXAM: ABDOMEN ULTRASOUND COMPLETE COMPARISON:  CT chest 09/19/2017, bone survey 09/19/2017 FINDINGS: Gallbladder: Circumferential gallbladder wall thickening, with no hyperechoic material within the gallbladder lumen. Negative sonographic Murphy's sign. No pericholecystic fluid. Common bile duct: Diameter: 2 mm-3 mm Liver: Increased echogenicity of the liver. The surface does not appear nodular. 1 cm anechoic cystic structure in the left liver with through transmission and no internal complexity. Cystic lesion of the right liver, towards the dome of the liver measures 3.9 cm x 3.9 cm x 4.0 cm. Through transmission with no significant internal complexity. No associated flow. IVC: No abnormality visualized. Pancreas:  Visualized portion unremarkable. Spleen: Size and appearance within normal limits. Right Kidney: Length: 14.0 cm. No hydronephrosis. Echogenicity somewhat increased. Several hypoechoic lesions of the right kidney involving the cortex. The largest 2 measure 3.6 cm x 2.7 cm x 3.7 cm and 2.7 cm x 2.5 cm x 2.3 cm. No internal flow and there is posterior Q stick enhancement. The more medial demonstrates a poorly defined margin. Left Kidney: Length: 16.3 cm. No hydronephrosis. Echogenicity similar to the right. Anechoic lesion at the inferior left kidney measures 6.5 cm x 4.7 cm x 5.4 cm. Through transmission with no significant internal complexity. Additional lesion on the left measures 4.8 cm x 5.6 cm x 5.5 cm. Abdominal aorta: No aneurysm visualized. Other findings: None. IMPRESSION: No hydronephrosis of the bilateral kidneys. Echogenicity of the bilateral kidneys is somewhat increased, suggesting chronic medical renal disease. Bilateral kidneys demonstrate cystic lesions. While most likely benign, there is a right-sided lesion with a poorly defined wall. Further evaluation with contrast-enhanced CT or MR may be useful. Circumferential gallbladder wall thickening with negative sonographic Murphy's sign. This finding may be seen with chronic liver disease or potentially right-sided heart failure. Correlation with presentation and lab values may be useful. The partially visualized lesions of the liver are not well characterized on the current ultrasound. The lesion at the liver dome appears cystic, however, correlation with contrast-enhanced CT or MR is recommended. Electronically Signed   By: Corrie Mckusick D.O.   On: 09/21/2017 07:08   Dg Chest Portable 1 View  Result Date: 09/19/2017 CLINICAL DATA:  Chest pain EXAM: PORTABLE CHEST 1 VIEW COMPARISON:  09/16/2017 FINDINGS: Cardiac shadow remains enlarged. Right basilar atelectasis and associated effusion is again identified stable from the prior exam. Left lung is  clear. No bony abnormality is noted. IMPRESSION: Right basilar atelectasis with associated effusions stable from the previous exam. Electronically Signed   By: Inez Catalina M.D.   On: 09/19/2017 11:43   Dg Bone Survey Met  Result Date: 09/19/2017 CLINICAL DATA:  Lytic lesions shown on prior chest CT EXAM: METASTATIC BONE SURVEY COMPARISON:  Chest CT performed earlier today FINDINGS: Possible small lytic lesions scattered throughout the calvarium. Lytic lesions noted in the mid to distal left clavicle and possible mid right clavicle. Scattered small lytic lesions throughout the humeri bilaterally and within the proximal to mid left radius and ulna. Small lytic lesion within the mid right radius. No focal lytic lesion or acute bony abnormality within the cervical, thoracic or lumbar spine. Mild degenerative facet disease throughout the lumbar spine. Probable small lytic lesions within the proximal to midshaft of the left  femur and possibly within the shafts of the left tibia and fibula. Lytic lesions scattered throughout the right tibia. Heart is borderline in size. Right pleural effusion with right lower lobe atelectasis. Left lung clear. Density projecting over the left lateral lower chest represents the soft tissue mass associated with the left 7th rib anteriorly. IMPRESSION: Numerous small lytic lesions scattered throughout the skull including the calvarium, bilateral clavicles, humeri, radius/ulna bilaterally, left femur, and bilateral tibia/fibula. Findings compatible with metastatic disease or myeloma. The sternum and rib lesions are better seen on chest CT. Soft tissue density over the left lower chest corresponds to the soft tissue mass associated with the anterior left 7th rib. Small to moderate right pleural effusion with right base atelectasis. Electronically Signed   By: Rolm Baptise M.D.   On: 09/19/2017 17:13     Medications:  Scheduled: . amiodarone  400 mg Oral Daily  . heparin  4,000 Units  Intravenous Once   Continuous: . sodium chloride 100 mL/hr at 09/21/17 0400   PJA:[id number] **OR** acetaminophen, bisacodyl, hydrALAZINE, HYDROcodone-acetaminophen, morphine injection, promethazine, senna-docusate  Assessment/Plan:    Paroxysmal atrial flutter Patient was seen by cardiology.  Patient was started on amiodarone and heparin infusions.  She appears to have converted to sinus rhythm.  TSH normal.  Echocardiogram is pending.  Patient taken off of heparin as she currently does not appear to be a good candidate for anticoagulation.  Further management per cardiology.    Multiple lytic lesions/hepatic lesions Patient found to have multiple lytic lesions on bone survey.  Concern is for metastatic process versus multiple myeloma.  Liver lesions appreciated on CT scan of the chest.  Ultrasound could not visualize these lesions well.  Unfortunately CT scan of the abdomen pelvis with contrast cannot be done due to elevated creatinine.  We will have to wait till creatinine improves.  LDH noted to be elevated 417.  Beta-2 microglobulin was 6.0.  SPEP is still pending.  Corrected calcium level is normal.  Case was discussed with Dr. Alen Blew with oncology on 7/28.  He recommended a tissue diagnosis as findings not conclusively suggestive of multiple myeloma.  She could have a solid tumor.  We will consult IR to consider biopsy of one of the skeletal lesions.  Right-sided pleural effusion Possibly related to the above.  Does not appear to be causing any respiratory discomfort or distress at this time.  Hold off on thoracentesis for now.  Respiratory status is stable.  Acute renal failure/Hyperkalemia Creatinine did climb but appears to be plateauing.  Monitor urine output.  She was given Kayexalate for hyperkalemia.  Potassium level is better today.  Continue IV fluids.  Monitor urine output.  Abdominal ultrasound did not show any hydronephrosis.  Cystic lesions are suspected.  Will need CT  scan or MRI when she is able.  UA showed large hemoglobin trace leukocytes rare bacteria 11-20 RBCs.  HIV nonreactive.  Transaminitis Elevated LFTs noted today.  Reason for this is not entirely clear.  Bilirubin is normal.  Continue to trend for now.  No evidence for gallbladder process on ultrasound.  However liver was not well visualized.  Check hepatitis panel.  Normocytic anemia Anemia panel reviewed.  Ferritin 463.  B12 level 726.  Folate 7.7.  Hemoglobin is lower today compared to yesterday but no evidence of overt bleeding.  Continue to watch for now.    Thrombocytopenia Platelet counts noted to be low but stable for the most part.  No evidence for bleeding.  Probably related to underlying malignancy.  Elevated blood pressure No known history of hypertension.  Continue to watch for now.  DVT Prophylaxis: Initiate SCDs Code Status: Full code Family Communication: Discussed with the patient.  Discussed with her son yesterday Disposition Plan: Management as outlined above.  Out of bed to chair.    LOS: 2 days   [facility] Pager [phone] 09/21/2017, 10:07 AM  If 7PM-7AM, please contact night-coverage at [email or web address], password [facility]

## 2017-09-22 ENCOUNTER — Other Ambulatory Visit: Payer: Self-pay

## 2017-09-22 ENCOUNTER — Encounter (HOSPITAL_COMMUNITY): Payer: Self-pay

## 2017-09-22 DIAGNOSIS — N281 Cyst of kidney, acquired: Secondary | ICD-10-CM

## 2017-09-22 DIAGNOSIS — R740 Nonspecific elevation of levels of transaminase and lactic acid dehydrogenase [LDH]: Secondary | ICD-10-CM

## 2017-09-22 DIAGNOSIS — Z79899 Other long term (current) drug therapy: Secondary | ICD-10-CM

## 2017-09-22 DIAGNOSIS — C9000 Multiple myeloma not having achieved remission: Secondary | ICD-10-CM

## 2017-09-22 LAB — COMPREHENSIVE METABOLIC PANEL
ALT: 380 U/L — ABNORMAL HIGH (ref 0–44)
AST: 656 U/L — AB (ref 15–41)
Albumin: 2.2 g/dL — ABNORMAL LOW (ref 3.5–5.0)
Alkaline Phosphatase: 81 U/L (ref 38–126)
Anion gap: 10 (ref 5–15)
BILIRUBIN TOTAL: 0.9 mg/dL (ref 0.3–1.2)
BUN: 25 mg/dL — AB (ref 8–23)
CO2: 22 mmol/L (ref 22–32)
Calcium: 8.4 mg/dL — ABNORMAL LOW (ref 8.9–10.3)
Chloride: 106 mmol/L (ref 98–111)
Creatinine, Ser: 1.83 mg/dL — ABNORMAL HIGH (ref 0.44–1.00)
GFR calc Af Amer: 32 mL/min — ABNORMAL LOW (ref >60)
GFR, EST NON AFRICAN AMERICAN: 27 mL/min — AB (ref >60)
Glucose, Bld: 100 mg/dL — ABNORMAL HIGH (ref 70–99)
POTASSIUM: 4.2 mmol/L (ref 3.5–5.1)
Sodium: 138 mmol/L (ref 135–145)
TOTAL PROTEIN: 9.7 g/dL — AB (ref 6.5–8.1)

## 2017-09-22 LAB — CBC
HEMATOCRIT: 25.4 % — AB (ref 36.0–46.0)
Hemoglobin: 8.2 g/dL — ABNORMAL LOW (ref 12.0–15.0)
MCH: 27.6 pg (ref 26.0–34.0)
MCHC: 32.3 g/dL (ref 30.0–36.0)
MCV: 85.5 fL (ref 78.0–100.0)
Platelets: 59 10*3/uL — ABNORMAL LOW (ref 150–400)
RBC: 2.97 MIL/uL — ABNORMAL LOW (ref 3.87–5.11)
RDW: 20.3 % — ABNORMAL HIGH (ref 11.5–15.5)
WBC: 9 10*3/uL (ref 4.0–10.5)

## 2017-09-22 LAB — PROTIME-INR
INR: 2.26
PROTHROMBIN TIME: 24.8 s — AB (ref 11.4–15.2)

## 2017-09-22 MED ORDER — VITAMIN K1 10 MG/ML IJ SOLN
5.0000 mg | Freq: Once | INTRAMUSCULAR | Status: AC
  Administered 2017-09-22: 5 mg via INTRAVENOUS
  Filled 2017-09-22: qty 0.5

## 2017-09-22 MED ORDER — METOPROLOL TARTRATE 12.5 MG HALF TABLET
12.5000 mg | ORAL_TABLET | Freq: Two times a day (BID) | ORAL | Status: DC
  Administered 2017-09-22 – 2017-09-28 (×13): 12.5 mg via ORAL
  Filled 2017-09-22 (×14): qty 1

## 2017-09-22 MED ORDER — DEXTROSE 5 % IV SOLN
5.0000 mg | Freq: Once | INTRAVENOUS | Status: AC
  Administered 2017-09-22: 5 mg via INTRAVENOUS
  Filled 2017-09-22: qty 0.5

## 2017-09-22 MED ORDER — POLYETHYLENE GLYCOL 3350 17 G PO PACK
17.0000 g | PACK | Freq: Every day | ORAL | Status: DC
  Administered 2017-09-23 – 2017-09-24 (×2): 17 g via ORAL
  Filled 2017-09-22 (×3): qty 1

## 2017-09-22 NOTE — Progress Notes (Signed)
Patient ID: Debbie Chan, female   DOB: 05-06-1950, 67 y.o.   MRN: [id number]   Pt was seen and set up for left 7th rib lesion biopsy for today INR 2.26 this am Would hold til INR wnl (Vit K has been given)  MD discussed with Dr Barbie Banner for plan for this pt Now Oncology on board SPEP showing M spikes--concerning for Multiple Myeloma  Pt is scheduled for Bone Marrow biopsy in am  She is aware Risks and benefits discussed with the patient including, but not limited to bleeding, infection, damage to adjacent structures or low yield requiring additional tests.  All of the patient's questions were answered, patient is agreeable to proceed. Consent signed and in chart.

## 2017-09-22 NOTE — Progress Notes (Signed)
[place] [phone]   [phone]  CONSULT NOTE  REFERRING PHYSICIAN: Dr. Bonnielee Haff  REASON FOR CONSULTATION:  67 years old African female with suspicious multiple myeloma.  HPI Debbie Chan is a 67 y.o. female with no significant past medical history.  The patient is visiting her family from [place].  She has been in the [place] since May 2019.  She started complaining of pain in the lower rib cage as well as shortness of breath for around 3 weeks without any cough or hemoptysis.  She was seen at 1 of the urgent care center and chest x-ray was performed on 09/16/2017 and that showed moderate right pleural effusion with right greater than left basilar atelectasis.  The patient was referred to the emergency department and on 09/19/2017 she had CT angiogram scan of the chest.  It showed multiple lytic lesions throughout the visualized bones with 3.5 x 7.0 cm soft tissue mass involving the left seventh rib worrisome for metastatic disease or myeloma.  There was moderate right pleural effusion and right lower lung atelectasis.  The effusion may be loculated along the right lung base.  Malignant effusion was not excluded giving the bony findings.  There was also hypodense areas along the liver which may represent sub-capsular fluid/masses versus hepatic lesions.  Abdominal and pelvic CT was recommended.  There was no evidence of pulmonary emboli and there was moderate to marketed cardiomegaly.  Skeletal bone survey was performed on 09/19/2017 and that showed numerous small lytic lesions are scattered throughout the skull involving the calvarium, bilateral clavicles, humeri, radius/ulna bilaterally, left femur and bilateral TBI/fibula.  The findings were compatible with metastatic disease or myeloma.  The sternum and rib lesions were better seen on the CT scan of the chest.  There is soft tissue density over the left lower chest correspond to the soft tissue  mass associated with the anterior left seventh rib.  She also had abdominal ultrasound performed on 09/21/2017 and it showed no hydronephrosis of the bilateral kidneys there was cystic lesions bilaterally in the kidney likely benign.  The partially visualized lesions of the liver are not well characterized on the ultrasound and the lesions at the liver dome appears cystic.  The patient had serum protein electrophoresis performed on 09/19/2017 and that showed M spike of 4.2.  She also had quantitative immunoglobulin and that showed significantly elevated IgA over 6400.  IgG was low at 405 and IgM was 8.  Beta-2 microglobulin was 6.0.  Complaints metabolic panel performed recently showed renal insufficiency with serum creatinine of 1.83.  Total protein was elevated at 9.7 with albumin of 2.2 and the patient also had elevated AST of 656 and ALT 380. I was asked to see the patient today for evaluation and recommendation regarding her condition. When seen today the patient is feeling fine except for pain on the right shoulder area.  She denied having any shortness of breath except with exertion with no significant cough or hemoptysis.  She has no current fever or chills.  She has no nausea, vomiting, diarrhea or constipation. Family history is unremarkable for any malignancy. The patient is a widow and has 4 children.  She is visiting from [place].  She works as a Pharmacist, hospital in [place]. She has no history for smoking, alcohol or drug abuse. HPI  History reviewed. No pertinent past medical history.  History reviewed. No pertinent surgical history.  History reviewed. No pertinent family history.  Social History Social  History   Tobacco Use  . Smoking status: Never Smoker  . Smokeless tobacco: Never Used  Substance Use Topics  . Alcohol use: Not on file  . Drug use: Not on file    No Known Allergies  Current Facility-Administered Medications  Medication Dose Route Frequency Provider Last Rate Last Dose   . 0.9 %  sodium chloride infusion   Intravenous Continuous Bonnielee Haff, MD 100 mL/hr at 09/22/17 0010    . acetaminophen (TYLENOL) tablet 650 mg  650 mg Oral Q6H PRN Rondel Jumbo, PA-C       Or  . acetaminophen (TYLENOL) suppository 650 mg  650 mg Rectal Q6H PRN Rondel Jumbo, PA-C      . bisacodyl (DULCOLAX) suppository 10 mg  10 mg Rectal Daily PRN Sharene Butters E, PA-C      . heparin bolus via infusion 4,000 Units  4,000 Units Intravenous Once Einar Grad, RPH      . hydrALAZINE (APRESOLINE) injection 5 mg  5 mg Intravenous Q8H PRN Rondel Jumbo, PA-C      . HYDROcodone-acetaminophen (NORCO/VICODIN) 5-325 MG per tablet 1-2 tablet  1-2 tablet Oral Q4H PRN Rondel Jumbo, PA-C   1 tablet at 09/22/17 1313  . metoprolol tartrate (LOPRESSOR) tablet 12.5 mg  12.5 mg Oral BID Bonnielee Haff, MD   12.5 mg at 09/22/17 1146  . morphine 2 MG/ML injection 1 mg  1 mg Intravenous Q4H PRN Sharene Butters E, PA-C      . phytonadione (VITAMIN K) 5 mg in dextrose 5 % 50 mL IVPB  5 mg Intravenous Once Bonnielee Haff, MD      . polyethylene glycol (MIRALAX / GLYCOLAX) packet 17 g  17 g Oral Daily Bonnielee Haff, MD      . promethazine (PHENERGAN) injection 6.25 mg  6.25 mg Intravenous Q6H PRN Bonnielee Haff, MD   6.25 mg at 09/20/17 1332  . senna-docusate (Senokot-S) tablet 1 tablet  1 tablet Oral QHS PRN Rondel Jumbo, PA-C        Review of Systems  Constitutional: positive for fatigue Eyes: negative Ears, nose, mouth, throat, and face: negative Respiratory: positive for dyspnea on exertion and pleurisy/chest pain Cardiovascular: negative Gastrointestinal: negative Genitourinary:negative Integument/breast: negative Hematologic/lymphatic: negative Musculoskeletal:positive for bone pain Neurological: negative Behavioral/Psych: negative Endocrine: negative Allergic/Immunologic: negative  Physical Exam  ZJI:RCVEL, healthy, no distress, well nourished, well developed and  anxious SKIN: skin color, texture, turgor are normal, no rashes or significant lesions HEAD: Normocephalic, No masses, lesions, tenderness or abnormalities EYES: normal, PERRLA, Conjunctiva are pink and non-injected EARS: External ears normal, Canals clear OROPHARYNX:no exudate, no erythema and lips, buccal mucosa, and tongue normal  NECK: supple, no adenopathy, no JVD LYMPH:  no palpable lymphadenopathy, no hepatosplenomegaly BREAST:not examined LUNGS: decreased breath sounds HEART: regular rate & rhythm, no murmurs and no gallops ABDOMEN:abdomen soft, non-tender, normal bowel sounds and no masses or organomegaly BACK: Back symmetric, no curvature., No CVA tenderness EXTREMITIES:no joint deformities, effusion, or inflammation, no edema  NEURO: alert & oriented x 3 with fluent speech, no focal motor/sensory deficits  PERFORMANCE STATUS: ECOG 1  LABORATORY DATA: Lab Results  Component Value Date   WBC 9.0 09/22/2017   HGB 8.2 (L) 09/22/2017   HCT 25.4 (L) 09/22/2017   MCV 85.5 09/22/2017   PLT 59 (L) 09/22/2017    _0 @  RADIOGRAPHIC STUDIES: Dg Chest 2 View  Result Date: 09/16/2017 CLINICAL DATA:  Anterior chest wall pain for 2 weeks with a  nonproductive cough. EXAM: CHEST - 2 VIEW COMPARISON:  None. FINDINGS: The cardiac silhouette is partially obscured though appears borderline enlarged. There is a moderate-sized right pleural effusion with associated compressive atelectasis. Minimal left basilar opacity likely represents atelectasis. The upper lungs are clear bilaterally. No pneumothorax is identified. No acute osseous abnormality is seen. IMPRESSION: Moderate right pleural effusion with right greater than left basilar atelectasis. Electronically Signed   By: Logan Bores M.D.   On: 09/16/2017 15:47   Ct Angio Chest Pe W And/or Wo Contrast  Result Date: 09/19/2017 CLINICAL DATA:  67 year old female with acute chest pain and shortness of breath for 3 weeks. EXAM: CT  ANGIOGRAPHY CHEST WITH CONTRAST TECHNIQUE: Multidetector CT imaging of the chest was performed using the standard protocol during bolus administration of intravenous contrast. Multiplanar CT image reconstructions and MIPs were obtained to evaluate the vascular anatomy. CONTRAST:  91m ISOVUE-370 IOPAMIDOL (ISOVUE-370) INJECTION 76% COMPARISON:  09/19/2017 chest radiograph FINDINGS: Cardiovascular: This is a technically satisfactory study. No pulmonary emboli are identified. Moderate to marked cardiomegaly noted with RIGHT atrial enlargement. Coronary artery and aortic atherosclerotic calcifications identified. No pericardial effusion or thoracic aortic aneurysm. Mediastinum/Nodes: No enlarged mediastinal, hilar, or axillary lymph nodes. Thyroid gland, trachea, and esophagus demonstrate no significant findings. Lungs/Pleura: A moderate RIGHT pleural effusion with RIGHT LOWER lung atelectasis noted. This effusion may be loculated along the RIGHT lung base. No pulmonary nodule, mass or consolidation.  No pneumothorax. Upper Abdomen: Hypodense areas along the liver may represent subcapsular fluid/masses versus parenchymal abnormalities. A small amount of fluid adjacent to the spleen is identified. Musculoskeletal: Multiple lytic lesions of the visualized bones identified involving the spine, ribs, clavicles and sternum. A 3.5 x 7 cm soft tissue destructive mass involving the anterolateral LEFT 7th rib is noted. Review of the MIP images confirms the above findings. IMPRESSION: 1. Multiple lytic lesions throughout the visualized bones with 3.5 x 7 cm soft tissue mass involving the LEFT 7th rib, worrisome for metastatic disease or myeloma. 2. Moderate RIGHT pleural effusion and RIGHT LOWER lung atelectasis. This effusion may be loculated along the RIGHT lung base. Malignant effusion not excluded given bony findings. 3. Hypodense areas along the liver which may represent subcapsular fluid/masses versus hepatic lesions.  Consider dedicated abdominal and pelvic CT with contrast for further evaluation. 4. No evidence of pulmonary emboli 5. Moderate to marked cardiomegaly 6. Coronary artery and Aortic Atherosclerosis (ICD10-I70.0). Electronically Signed   By: JMargarette CanadaM.D.   On: 09/19/2017 14:38   [place]domen Complete  Result Date: 09/21/2017 CLINICAL DATA:  67year old female with a history of acute renal failure. Recent bone survey demonstrates evidence of metastatic disease or myeloma EXAM: ABDOMEN ULTRASOUND COMPLETE COMPARISON:  CT chest 09/19/2017, bone survey 09/19/2017 FINDINGS: Gallbladder: Circumferential gallbladder wall thickening, with no hyperechoic material within the gallbladder lumen. Negative sonographic Murphy's sign. No pericholecystic fluid. Common bile duct: Diameter: 2 mm-3 mm Liver: Increased echogenicity of the liver. The surface does not appear nodular. 1 cm anechoic cystic structure in the left liver with through transmission and no internal complexity. Cystic lesion of the right liver, towards the dome of the liver measures 3.9 cm x 3.9 cm x 4.0 cm. Through transmission with no significant internal complexity. No associated flow. IVC: No abnormality visualized. Pancreas: Visualized portion unremarkable. Spleen: Size and appearance within normal limits. Right Kidney: Length: 14.0 cm. No hydronephrosis. Echogenicity somewhat increased. Several hypoechoic lesions of the right kidney involving the cortex. The largest 2 measure 3.6 cm x  2.7 cm x 3.7 cm and 2.7 cm x 2.5 cm x 2.3 cm. No internal flow and there is posterior Q stick enhancement. The more medial demonstrates a poorly defined margin. Left Kidney: Length: 16.3 cm. No hydronephrosis. Echogenicity similar to the right. Anechoic lesion at the inferior left kidney measures 6.5 cm x 4.7 cm x 5.4 cm. Through transmission with no significant internal complexity. Additional lesion on the left measures 4.8 cm x 5.6 cm x 5.5 cm. Abdominal aorta: No aneurysm  visualized. Other findings: None. IMPRESSION: No hydronephrosis of the bilateral kidneys. Echogenicity of the bilateral kidneys is somewhat increased, suggesting chronic medical renal disease. Bilateral kidneys demonstrate cystic lesions. While most likely benign, there is a right-sided lesion with a poorly defined wall. Further evaluation with contrast-enhanced CT or MR may be useful. Circumferential gallbladder wall thickening with negative sonographic Murphy's sign. This finding may be seen with chronic liver disease or potentially right-sided heart failure. Correlation with presentation and lab values may be useful. The partially visualized lesions of the liver are not well characterized on the current ultrasound. The lesion at the liver dome appears cystic, however, correlation with contrast-enhanced CT or MR is recommended. Electronically Signed   By: Corrie Mckusick D.O.   On: 09/21/2017 07:08   Dg Chest Portable 1 View  Result Date: 09/19/2017 CLINICAL DATA:  Chest pain EXAM: PORTABLE CHEST 1 VIEW COMPARISON:  09/16/2017 FINDINGS: Cardiac shadow remains enlarged. Right basilar atelectasis and associated effusion is again identified stable from the prior exam. Left lung is clear. No bony abnormality is noted. IMPRESSION: Right basilar atelectasis with associated effusions stable from the previous exam. Electronically Signed   By: Inez Catalina M.D.   On: 09/19/2017 11:43   Dg Bone Survey Met  Result Date: 09/19/2017 CLINICAL DATA:  Lytic lesions shown on prior chest CT EXAM: METASTATIC BONE SURVEY COMPARISON:  Chest CT performed earlier today FINDINGS: Possible small lytic lesions scattered throughout the calvarium. Lytic lesions noted in the mid to distal left clavicle and possible mid right clavicle. Scattered small lytic lesions throughout the humeri bilaterally and within the proximal to mid left radius and ulna. Small lytic lesion within the mid right radius. No focal lytic lesion or acute bony  abnormality within the cervical, thoracic or lumbar spine. Mild degenerative facet disease throughout the lumbar spine. Probable small lytic lesions within the proximal to midshaft of the left femur and possibly within the shafts of the left tibia and fibula. Lytic lesions scattered throughout the right tibia. Heart is borderline in size. Right pleural effusion with right lower lobe atelectasis. Left lung clear. Density projecting over the left lateral lower chest represents the soft tissue mass associated with the left 7th rib anteriorly. IMPRESSION: Numerous small lytic lesions scattered throughout the skull including the calvarium, bilateral clavicles, humeri, radius/ulna bilaterally, left femur, and bilateral tibia/fibula. Findings compatible with metastatic disease or myeloma. The sternum and rib lesions are better seen on chest CT. Soft tissue density over the left lower chest corresponds to the soft tissue mass associated with the anterior left 7th rib. Small to moderate right pleural effusion with right base atelectasis. Electronically Signed   By: Rolm Baptise M.D.   On: 09/19/2017 17:13    ASSESSMENT: This is a very pleasant 67 years old African-American female with highly suspicious multiple myeloma presented with multiple lytic lesions as well as elevated IgA and beta-2 microglobulin.   PLAN: I had a lengthy discussion with the patient and her daughter-in-law today about  her current condition and treatment options. I recommended for the patient to have a bone marrow biopsy and aspirate for confirmation of her diagnosis. I will order serum light chain. Her renal insufficiency is likely secondary to the multiple myeloma. Her elevated liver enzymes are probably secondary to congestive heart failure with congested liver. I would consider The patient for treatment with systemic chemotherapy with subcutaneous Velcade as well as Decadron.  I may add Revlimid once her renal insufficiency improves. I  will arrange for the patient a follow-up appointment with me at the cancer center few days after discharge for more detailed discussion of this treatment options and to start treatment on outpatient basis. The patient may also benefit from palliative radiotherapy to the painful lytic lesions and the left seventh rib as well as the right shoulder. She may also benefit from treatment with Zometa for the bone disease. The patient voices understanding of current disease status and treatment options and is in agreement with the current care plan.  All questions were answered. The patient knows to call the clinic with any problems, questions or concerns. We can certainly see the patient much sooner if necessary.  Thank you so much for allowing me to participate in the care of Debbie Chan. I will continue to follow up the patient with you and assist in her care.  Disclaimer: This note was dictated with voice recognition software. Similar sounding words can inadvertently be transcribed and may not be corrected upon review.   Eilleen Kempf September 22, 2017, 6:16 PM

## 2017-09-22 NOTE — Progress Notes (Addendum)
[facility] PROGRESS NOTE  Kirsi Hugh HUT:[id number] DOB: Aug 06, 1950 DOA: 09/19/2017  PCP: [name], MD  Brief History/Interval Summary: 67 year old female visiting from [place] with no significant past medical history was initially taken to the urgent care center with complaints of left-sided chest pain, shortness of breath ongoing for the past 3 weeks.  She was seen by a local provider last week who ordered a chest x-ray for her symptoms which showed pleural effusion.  Before she could follow-up her symptoms worsened so she decided to come into the hospital.  She underwent CT angiogram of her chest which did not show PE but did show multiple lytic lesions concerning for either multiple myeloma or metastatic cancer.  She was also found to be in atrial flutter.  Hospitalized for further management.  Reason for Visit: Atrial flutter.  Multiple lytic lesions.  Consultants: Cardiology.  Medical oncology  Procedures:   Transthoracic echocardiogram Study Conclusions  - Left ventricle: The cavity size was normal. There was mild focal   basal hypertrophy of the septum. Systolic function was normal.   The estimated ejection fraction was in the range of 55% to 60%.   Wall motion was normal; there were no regional wall motion   abnormalities. Left ventricular diastolic function parameters   were normal. - Ventricular septum: The contour showed diastolic flattening and   systolic flattening. These changes are consistent with RV volume   and pressure overload. - Aortic valve: Trileaflet; normal thickness, moderately calcified   leaflets. There was trivial regurgitation. - Mitral valve: There was mild regurgitation. - Left atrium: The atrium was mildly dilated. - Right ventricle: The cavity size was moderately dilated. Wall   thickness was normal. Systolic function was moderately reduced. - Right atrium: The atrium was massively dilated. - Tricuspid valve: There was  moderate-severe regurgitation. - Pulmonic valve: There was trivial regurgitation. - Pulmonary arteries: PA peak pressure: 37 mm Hg (S).  Impressions:  - The right ventricular systolic pressure was increased consistent   with mild pulmonary hypertension.  Antibiotics: None  Subjective/Interval History: Patient states that she has not had a bowel movement in many days.  Still passing gas.  No nausea vomiting.  Denies any pain.    ROS: Denies any headaches  Objective:  Vital Signs  Vitals:   09/21/17 2333 09/22/17 0306 09/22/17 0417 09/22/17 0745  BP: (!) 143/80  134/86 122/75  Pulse:      Resp: (!) 28  18 (!) 21  Temp: 98.2 F (36.8 C)  98.1 F (36.7 C) 97.8 F (36.6 C)  TempSrc: Oral  Oral Oral  SpO2:      Weight:  87.2 kg (192 lb 3.9 oz)    Height:        Intake/Output Summary (Last 24 hours) at 09/22/2017 0943 Last data filed at 09/22/2017 0700 Gross per 24 hour  Intake 1000 ml  Output 450 ml  Net 550 ml   Filed Weights   09/20/17 0425 09/21/17 0304 09/22/17 0306  Weight: 84.4 kg (186 lb 1.1 oz) 85.4 kg (188 lb 4.8 oz) 87.2 kg (192 lb 3.9 oz)    General appearance: Awake alert.  In no distress Resp: Diminished air entry at the bases.  No wheezing rales or rhonchi. Cardio: Is normal regular.  No S3-S4.  No rubs murmurs or bruit GI: Abdomen soft.  Nontender nondistended.  Palpable liver edge nontender.  No splenomegaly on examination. Extremities: No pedal edema Neurologic: No focal neurological deficits.  Lab Results:  Data  Reviewed: I have personally reviewed following labs and imaging studies  CBC: Recent Labs  Lab 09/19/17 1122 09/20/17 0005 09/20/17 0632 09/21/17 0231 09/22/17 0310  WBC 7.0 8.1 12.9* 8.0 9.0  HGB 8.8* 10.3* 8.9* 8.3* 8.2*  HCT 28.4* 32.4* 28.5* 25.9* 25.4*  MCV 86.9 86.4 87.2 84.9 85.5  PLT 110* 80* 76* 72* 59*    Basic Metabolic Panel: Recent Labs  Lab 09/19/17 1123 09/20/17 0632 09/20/17 1822 09/21/17 0231  09/22/17 0310  NA 136 134* 133* 134* 138  K 4.4 5.4* 5.2* 4.5 4.2  CL 101 96* 98 100 106  CO2 22 14* 14* 21* 22  GLUCOSE 109* 91 96 101* 100*  BUN 15 22 26* 28* 25*  CREATININE 1.45* 2.11* 2.21* 2.20* 1.83*  CALCIUM 9.2 9.1 8.9 8.3* 8.4*  MG 2.1  --   --   --   --     GFR: Estimated Creatinine Clearance: 34.5 mL/min (A) (by C-G formula based on SCr of 1.83 mg/dL (H)).  Liver Function Tests: Recent Labs  Lab 09/19/17 1123 09/21/17 0231 09/22/17 0310  AST 65* 389* 656*  ALT 42 197* 380*  ALKPHOS 69 78 81  BILITOT 0.7 0.9 0.9  PROT 10.8* 9.5* 9.7*  ALBUMIN 2.5* 2.1* 2.2*    Coagulation Profile: Recent Labs  Lab 09/20/17 4193 09/22/17 0310  INR 1.91 2.26    Cardiac Enzymes: Recent Labs  Lab 09/19/17 1702 09/20/17 0005 09/20/17 0632  TROPONINI <0.03 <0.03 <0.03    Thyroid Function Tests: Recent Labs    09/19/17 1632 09/19/17 1702  TSH 2.603  --   FREET4  --  1.13    Anemia Panel: Recent Labs    09/19/17 1632 09/19/17 1702  VITAMINB12  --  726  FOLATE 7.7  --   FERRITIN  --  463*  TIBC  --  218*  IRON  --  111  RETICCTPCT  --  2.9    Recent Results (from the past 240 hour(s))  MRSA PCR Screening     Status: None   Collection Time: 09/19/17  5:33 PM  Result Value Ref Range Status   MRSA by PCR NEGATIVE NEGATIVE Final    Comment:        The GeneXpert MRSA Assay (FDA approved for NASAL specimens only), is one component of a comprehensive MRSA colonization surveillance program. It is not intended to diagnose MRSA infection nor to guide or monitor treatment for MRSA infections. Performed at [facility], [place] [address] [place], [place] [address]       Radiology Studies: US Abdomen Complete  Result Date: 09/21/2017 CLINICAL DATA:  67 year old female with a history of acute renal failure. Recent bone survey demonstrates evidence of metastatic disease or myeloma EXAM: ABDOMEN ULTRASOUND COMPLETE COMPARISON:  CT chest 09/19/2017,  bone survey 09/19/2017 FINDINGS: Gallbladder: Circumferential gallbladder wall thickening, with no hyperechoic material within the gallbladder lumen. Negative sonographic Murphy's sign. No pericholecystic fluid. Common bile duct: Diameter: 2 mm-3 mm Liver: Increased echogenicity of the liver. The surface does not appear nodular. 1 cm anechoic cystic structure in the left liver with through transmission and no internal complexity. Cystic lesion of the right liver, towards the dome of the liver measures 3.9 cm x 3.9 cm x 4.0 cm. Through transmission with no significant internal complexity. No associated flow. IVC: No abnormality visualized. Pancreas: Visualized portion unremarkable. Spleen: Size and appearance within normal limits. Right Kidney: Length: 14.0 cm. No hydronephrosis. Echogenicity somewhat increased. Several hypoechoic lesions of  the right kidney involving the cortex. The largest 2 measure 3.6 cm x 2.7 cm x 3.7 cm and 2.7 cm x 2.5 cm x 2.3 cm. No internal flow and there is posterior Q stick enhancement. The more medial demonstrates a poorly defined margin. Left Kidney: Length: 16.3 cm. No hydronephrosis. Echogenicity similar to the right. Anechoic lesion at the inferior left kidney measures 6.5 cm x 4.7 cm x 5.4 cm. Through transmission with no significant internal complexity. Additional lesion on the left measures 4.8 cm x 5.6 cm x 5.5 cm. Abdominal aorta: No aneurysm visualized. Other findings: None. IMPRESSION: No hydronephrosis of the bilateral kidneys. Echogenicity of the bilateral kidneys is somewhat increased, suggesting chronic medical renal disease. Bilateral kidneys demonstrate cystic lesions. While most likely benign, there is a right-sided lesion with a poorly defined wall. Further evaluation with contrast-enhanced CT or MR may be useful. Circumferential gallbladder wall thickening with negative sonographic Murphy's sign. This finding may be seen with chronic liver disease or potentially  right-sided heart failure. Correlation with presentation and lab values may be useful. The partially visualized lesions of the liver are not well characterized on the current ultrasound. The lesion at the liver dome appears cystic, however, correlation with contrast-enhanced CT or MR is recommended. Electronically Signed   By: Corrie Mckusick D.O.   On: 09/21/2017 07:08     Medications:  Scheduled: . amiodarone  400 mg Oral Daily  . heparin  4,000 Units Intravenous Once  . polyethylene glycol  17 g Oral Daily   Continuous: . sodium chloride 100 mL/hr at 09/22/17 0010  . phytonadione (VITAMIN K) IV     JJH:ERDEYCXKGYJEH **OR** acetaminophen, bisacodyl, hydrALAZINE, HYDROcodone-acetaminophen, morphine injection, promethazine, senna-docusate  Assessment/Plan:    Paroxysmal atrial flutter Patient was seen by cardiology.  Patient was started on amiodarone and heparin infusions.  Patient converted to sinus rhythm.  TSH was normal.  Echocardiogram as above.  Patient was taken off of heparin as she was not thought to be a good candidate for anticoagulation at this time.  She was transitioned to oral amiodarone. Cardiology had signed off.  Discussed with cardiology today, Dr. Harrell Gave.  Amiodarone to be discontinued due to rising LFTs.  Metoprolol 12.5 mg twice daily is recommended to begin with.  May need dose titration.  Multiple lytic lesions/hepatic lesions Patient found to have multiple lytic lesions on bone survey.  Concern is for metastatic process versus multiple myeloma.  Liver lesions appreciated on CT scan of the chest.  Ultrasound could not visualize these lesions well.  Unfortunately CT scan of the abdomen pelvis with contrast cannot be done due to elevated creatinine.  We will have to wait till creatinine improves.  LDH noted to be elevated 417.  Beta-2 microglobulin was 6.0.  SPEP does show M spike's.  Discussed with Dr. Julien Nordmann.  He will consult.  Corrected calcium level has been  normal.  Initially plan was to biopsy one of the lytic lesions but since this appears to be more like myeloma Dr. Earlie Server recommends a bone marrow biopsy instead.  Due to elevated INR the biopsy cannot be done today.  She will be given vitamin K.   Right-sided pleural effusion Possibly related to the above.  Does not appear to be causing any respiratory discomfort or distress at this time.  Hold off on thoracentesis for now.  Respiratory status is stable.  Acute renal failure/Hyperkalemia Creatinine did climb but appears to be plateauing.  Monitor urine output.  Potassium level improved with  Kayexalate.  Creatinine is slightly better today compared to yesterday.  Continue IV fluids.  Abdominal ultrasound did not show any hydronephrosis but cystic renal lesions were suspected.  She will need a dedicated contrast-enhanced CT or MRI when she is able to have this done based on her renal function.  UA showed large hemoglobin trace leukocytes rare bacteria 11-20 RBCs.  HIV nonreactive.  Transaminitis LFTs continue to climb.  INR also noted to be elevated today.  She will be given vitamin K.  Etiology for her transaminitis is likely multifactorial.  She did have atrial flutter with RVR presentation.  There could be an element of shock liver.  Patient also noted to be on amiodarone which could also be contributing.  No evidence for gallbladder process on ultrasound.  Common bile duct was normal size.  No ductal dilatation noted.  Hepatitis panel is pending.  Would ideally like to do a contrast-enhanced CT scan or MRI but due to renal failure unable to do at this time.  Continue to trend LFTs.  We will discuss with cardiology as to amiodarone.  If LFTs do not stabilize soon then may have to get opinion from gastroenterology.  Discussed with Dr. Harrell Gave with cardiology.  She recommends discontinuing the amiodarone.  Normocytic anemia Anemia panel reviewed.  Ferritin 463.  B12 level 726.  Folate 7.7.   Hemoglobin is low but stable.  No evidence of overt bleeding.    Thrombocytopenia Platelet counts are lower today.  No evidence for bleeding.  Probably related to underlying malignancy.  Elevated blood pressure No known history of hypertension.  Continue to watch for now.  DVT Prophylaxis: SCDs Code Status: Full code Family Communication: Discussed with the patient.  No family at bedside today. Disposition Plan: Management as outlined above.  Continue to mobilize.    LOS: 3 days   [facility] Pager [phone] 09/22/2017, 9:43 AM  If 7PM-7AM, please contact night-coverage at [email or web address], password [facility]

## 2017-09-22 NOTE — Care Management Note (Addendum)
Case Management Note  Patient Details  Name: Debbie Chan MRN: [id number] Date of Birth: Nov 12, 1950  Subjective/Objective:   Pt admitted with CP - now determined to be CA                 Action/Plan:  PTA independent from [place] - pt is visiting son and daughter in law living in [place].  Pt has been in the [place] since May 2019.  Pt does not have [place] citizen ship and is not covered by insurance - daughter in law to arrange for her church clinic doctor to preside as PCP until another one can be established.  Oncologist at bedside during assessment- plan is as follows ; pt will discharge home to care of family and will have follow up arranged by Dr Mora Appl at [place] [facility].  Per oncologist - pt will receive all necessary medications and treatments under internal CA charity program.     Expected Discharge Date:  09/23/17               Expected Discharge Plan:  Home/Self Care  In-House Referral:     Discharge planning Services  CM Consult  Post Acute Care Choice:    Choice offered to:     DME Arranged:    DME Agency:     HH Arranged:    HH Agency:     Status of Service:  In process, will continue to follow  If discussed at Long Length of Stay Meetings, dates discussed:    Additional Comments: CM informed by bedside nurse that pt will be ambulated on regular basis to ensure pt is optimized prior to discharge with mobility. Maryclare Labrador, RN 09/22/2017, 3:24 PM

## 2017-09-23 ENCOUNTER — Inpatient Hospital Stay (HOSPITAL_COMMUNITY): Payer: Self-pay

## 2017-09-23 LAB — COMPREHENSIVE METABOLIC PANEL
ALK PHOS: 82 U/L (ref 38–126)
ALT: 562 U/L — ABNORMAL HIGH (ref 0–44)
ANION GAP: 20 — AB (ref 5–15)
AST: 838 U/L — ABNORMAL HIGH (ref 15–41)
Albumin: 2.2 g/dL — ABNORMAL LOW (ref 3.5–5.0)
BUN: 27 mg/dL — ABNORMAL HIGH (ref 8–23)
CALCIUM: 8.4 mg/dL — AB (ref 8.9–10.3)
CO2: 13 mmol/L — AB (ref 22–32)
Chloride: 102 mmol/L (ref 98–111)
Creatinine, Ser: 1.68 mg/dL — ABNORMAL HIGH (ref 0.44–1.00)
GFR, EST AFRICAN AMERICAN: 35 mL/min — AB (ref >60)
GFR, EST NON AFRICAN AMERICAN: 30 mL/min — AB (ref >60)
Glucose, Bld: 60 mg/dL — ABNORMAL LOW (ref 70–99)
Potassium: 5.1 mmol/L (ref 3.5–5.1)
SODIUM: 135 mmol/L (ref 135–145)
TOTAL PROTEIN: 10.3 g/dL — AB (ref 6.5–8.1)
Total Bilirubin: 1.5 mg/dL — ABNORMAL HIGH (ref 0.3–1.2)

## 2017-09-23 LAB — CBC WITH DIFFERENTIAL/PLATELET
BASOS PCT: 0 %
Basophils Absolute: 0 10*3/uL (ref 0.0–0.1)
EOS ABS: 0 10*3/uL (ref 0.0–0.7)
Eosinophils Relative: 0 %
HCT: 29.9 % — ABNORMAL LOW (ref 36.0–46.0)
HEMOGLOBIN: 9.3 g/dL — AB (ref 12.0–15.0)
Lymphocytes Relative: 22 %
Lymphs Abs: 2.1 10*3/uL (ref 0.7–4.0)
MCH: 27.4 pg (ref 26.0–34.0)
MCHC: 31.1 g/dL (ref 30.0–36.0)
MCV: 87.9 fL (ref 78.0–100.0)
MONO ABS: 1.2 10*3/uL — AB (ref 0.1–1.0)
Monocytes Relative: 12 %
NEUTROS ABS: 6.4 10*3/uL (ref 1.7–7.7)
Neutrophils Relative %: 66 %
Platelets: 43 10*3/uL — ABNORMAL LOW (ref 150–400)
RBC: 3.4 MIL/uL — ABNORMAL LOW (ref 3.87–5.11)
RDW: 21.2 % — ABNORMAL HIGH (ref 11.5–15.5)
WBC: 9.7 10*3/uL (ref 4.0–10.5)

## 2017-09-23 LAB — HEPATITIS PANEL, ACUTE
HEP A IGM: NEGATIVE
HEP B C IGM: NEGATIVE
Hepatitis B Surface Ag: NEGATIVE

## 2017-09-23 LAB — PROTIME-INR
INR: 2.37
PROTHROMBIN TIME: 25.7 s — AB (ref 11.4–15.2)

## 2017-09-23 MED ORDER — LIDOCAINE HCL 1 % IJ SOLN
INTRAMUSCULAR | Status: AC
  Administered 2017-09-23: 10:00:00
  Filled 2017-09-23: qty 20

## 2017-09-23 MED ORDER — MIDAZOLAM HCL 2 MG/2ML IJ SOLN
INTRAMUSCULAR | Status: AC
  Administered 2017-09-23: 09:00:00
  Filled 2017-09-23: qty 2

## 2017-09-23 MED ORDER — FUROSEMIDE 10 MG/ML IJ SOLN
40.0000 mg | Freq: Once | INTRAMUSCULAR | Status: AC
  Administered 2017-09-23: 40 mg via INTRAVENOUS
  Filled 2017-09-23: qty 4

## 2017-09-23 MED ORDER — FENTANYL CITRATE (PF) 100 MCG/2ML IJ SOLN
INTRAMUSCULAR | Status: AC | PRN
  Administered 2017-09-23: 25 ug via INTRAVENOUS

## 2017-09-23 MED ORDER — MIDAZOLAM HCL 2 MG/2ML IJ SOLN
INTRAMUSCULAR | Status: AC | PRN
  Administered 2017-09-23: 1 mg via INTRAVENOUS

## 2017-09-23 MED ORDER — FENTANYL CITRATE (PF) 100 MCG/2ML IJ SOLN
INTRAMUSCULAR | Status: AC
  Administered 2017-09-23: 09:00:00
  Filled 2017-09-23: qty 2

## 2017-09-23 NOTE — Progress Notes (Signed)
PROGRESS NOTE    Debbie Chan  XIP:[id number] DOB: 01/23/1951 DOA: 09/19/2017 PCP: [name], MD    Brief Narrative:  67 year old female who presented with dyspnea and chest pain.  With no significant past medical history, complaint of left lower rib cage chest pain, and dyspnea for the last 3 weeks.  She was diagnosed as an outpatient with a pleural effusion.  Denies any fever or chills.  On initial physical examination blood pressure 135/99, heart rate 138, temperature 97.5, respirate 26, oxygen saturation 100%.  Moist mucous membranes, lungs clear to auscultation bilaterally, heart S1-S2 present rhythmic, tachycardic, abdomen soft nontender, no lower extremity edema.  Patient was admitted to the hospital working diagnosis of atrial flutter with rapid ventricular response.    Assessment & Plan:   Principal Problem:   Chest pain Active Problems:   Tachycardia   Lytic bone lesions on xray   Anemia   AKI (acute kidney injury) (HCC)   Pleural effusion on right   Hepatomegaly   Atrial flutter (HCC)   Multiple myeloma without remission ([place])   1. Paroxysmal atrial flutter. Will continue rate control with metoprolol 12.5 mg po bid, amiodarone has been discontinued due to elevated liver enzymes.   2. Multiple myeloma. Follow on bone marrow biopsy, patient with lytic bone lesions and positive M spike, noted elevated IgA. Will follow on oncology recommendations.   3. AKI with hyperkalemia. Improved renal function with serum cr down to 1.68 from 2.20. K at 5,1 and serum bicarbonate at 13. Clinically with signs of volume overload, fluid balance 3000 ml positive, will check chest film and will do trial of furosemide 40 mg IV x1.   4. Elevated liver enzymes. Persistent elevation AST 838 ans ALT 562, will continue monitoring, avoid hepatotoxic medications including amiodarone.   5. Anemia with thrombocytopenia. Stable hb at 9,3 with hct at 29,9, will follow on cell count in am.   6.  HTN. Patient off antihypertensive agents, will continue close monitoring, systolic blood pressure 673 to 161.    DVT prophylaxis: heparin   Code Status: full Family Communication: no family at the bedside  Disposition Plan/ discharge barriers: pending bone marrow biopsy.    Consultants:   Oncology   Procedures:   Bone marrow biopsy   Antimicrobials:       Subjective: Patient somnolent, able to respond to simple questions but unable to give detailed history, no nausea or vomiting, no chest pain but looks dyspneic.   Objective: Vitals:   09/23/17 0925 09/23/17 0930 09/23/17 0935 09/23/17 0945  BP: (!) 173/79 (!) 172/97 (!) 143/84 (!) 166/80  Pulse: 85 87 84 100  Resp: _0 Temp:      TempSrc:      SpO2:  94% 95% 96%  Weight:      Height:        Intake/Output Summary (Last 24 hours) at 09/23/2017 0950 Last data filed at 09/23/2017 0500 Gross per 24 hour  Intake -  Output 700 ml  Net -700 ml   Filed Weights   09/21/17 0304 09/22/17 0306 09/23/17 0425  Weight: 85.4 kg (188 lb 4.8 oz) 87.2 kg (192 lb 3.9 oz) 90.3 kg (199 lb 1.2 oz)    Examination:   General: deconditioned, ill looking appearing and dyspneic.  Neurology: Awake and alert, non focal  E ENT: mild pallor, no icterus, oral mucosa moist Cardiovascular: positive JVD. S1-S2 present, rhythmic, no gallops, rubs, or murmurs. No lower extremity edema. Pulmonary: decreased breath  sounds bilaterally, adequate air movement, no wheezing, rhonchi or rales. Gastrointestinal. Abdomen with no organomegaly, non tender, no rebound or guarding Skin. No rashes Musculoskeletal: no joint deformities     Data Reviewed: I have personally reviewed following labs and imaging studies  CBC: Recent Labs  Lab 09/20/17 0005 09/20/17 0632 09/21/17 0231 09/22/17 0310 09/23/17 0410  WBC 8.1 12.9* 8.0 9.0 9.7  NEUTROABS  --   --   --   --  6.4  HGB 10.3* 8.9* 8.3* 8.2* 9.3*  HCT 32.4* 28.5* 25.9* 25.4* 29.9*    MCV 86.4 87.2 84.9 85.5 87.9  PLT 80* 76* 72* 59* 43*   Basic Metabolic Panel: Recent Labs  Lab 09/19/17 1123 09/20/17 0632 09/20/17 1822 09/21/17 0231 09/22/17 0310 09/23/17 0410  NA 136 134* 133* 134* 138 135  K 4.4 5.4* 5.2* 4.5 4.2 5.1  CL 101 96* 98 100 106 102  CO2 22 14* 14* 21* 22 13*  GLUCOSE 109* 91 96 101* 100* 60*  BUN 15 22 26* 28* 25* 27*  CREATININE 1.45* 2.11* 2.21* 2.20* 1.83* 1.68*  CALCIUM 9.2 9.1 8.9 8.3* 8.4* 8.4*  MG 2.1  --   --   --   --   --    GFR: Estimated Creatinine Clearance: 38.2 mL/min (A) (by C-G formula based on SCr of 1.68 mg/dL (H)). Liver Function Tests: Recent Labs  Lab 09/19/17 1123 09/21/17 0231 09/22/17 0310 09/23/17 0410  AST 65* 389* 656* 838*  ALT 42 197* 380* 562*  ALKPHOS 69 78 81 82  BILITOT 0.7 0.9 0.9 1.5*  PROT 10.8* 9.5* 9.7* 10.3*  ALBUMIN 2.5* 2.1* 2.2* 2.2*   No results for input(s): LIPASE, AMYLASE in the last 168 hours. No results for input(s): AMMONIA in the last 168 hours. Coagulation Profile: Recent Labs  Lab 09/20/17 0632 09/22/17 0310 09/23/17 0410  INR 1.91 2.26 2.37   Cardiac Enzymes: Recent Labs  Lab 09/19/17 1702 09/20/17 0005 09/20/17 0632  TROPONINI <0.03 <0.03 <0.03   BNP (last 3 results) No results for input(s): PROBNP in the last 8760 hours. HbA1C: No results for input(s): HGBA1C in the last 72 hours. CBG: No results for input(s): GLUCAP in the last 168 hours. Lipid Profile: No results for input(s): CHOL, HDL, LDLCALC, TRIG, CHOLHDL, LDLDIRECT in the last 72 hours. Thyroid Function Tests: No results for input(s): TSH, T4TOTAL, FREET4, T3FREE, THYROIDAB in the last 72 hours. Anemia Panel: No results for input(s): VITAMINB12, FOLATE, FERRITIN, TIBC, IRON, RETICCTPCT in the last 72 hours.    Radiology Studies: I have reviewed all of the imaging during this hospital visit personally     Scheduled Meds: . fentaNYL      . lidocaine      . metoprolol tartrate  12.5 mg Oral  BID  . midazolam      . polyethylene glycol  17 g Oral Daily   Continuous Infusions: . sodium chloride Stopped (09/23/17 0810)     LOS: 4 days        Lonald Troiani Gerome Apley, MD [facility] Pager [phone]

## 2017-09-23 NOTE — Evaluation (Signed)
Physical Therapy Evaluation Patient Details Name: Debbie Chan MRN: [id number] DOB: 1950-09-09 Today's Date: 09/23/2017   History of Present Illness  67 year old female who presented with dyspnea and chest pain.  With no significant past medical history, complaint of left lower rib cage chest pain, and dyspnea for the last 3 weeks.  She was diagnosed as an outpatient with a pleural effusion.    Clinical Impression  Pt admitted with above diagnosis. Pt currently with functional limitations due to the deficits listed below (see PT Problem List). PTA, pt reports independence with mobility living with her children and grandchildren with stairs to enter home. Today, pt presents with fatigue lethargy and balance deficits that have worsened today. Per nursing staff patient was ambulating independently several days ago, however today has declined. Currently presents as fall risk and requires hands on physical assistance to prevent falls. Limited assessment, will cont to follow and update recs if appropriate. At this point patient unsafe to return home, if medical issues improve she may progress to home.  Pt will benefit from skilled PT to increase their independence and safety with mobility to allow discharge to the venue listed below.       Follow Up Recommendations Home health PT;Supervision/Assistance - 24 hour(Pending Progression)    Equipment Recommendations  (TBD)    Recommendations for Other Services       Precautions / Restrictions Precautions Precautions: Fall Restrictions Weight Bearing Restrictions: No      Mobility  Bed Mobility Overal bed mobility: Needs Assistance Bed Mobility: Supine to Sit     Supine to sit: Min assist        Transfers Overall transfer level: Needs assistance   Transfers: Sit to/from Stand Sit to Stand: Mod assist            Ambulation/Gait Ambulation/Gait assistance: Min assist Gait Distance (Feet): 10 Feet Assistive device: None;2 person  hand held assist Gait Pattern/deviations: Step-to pattern Gait velocity: decreased   General Gait Details: min A to stablize pt imbalanced and swaying, no control on seated attempts to bed and chair, very weak and not herself.  Stairs            Wheelchair Mobility    Modified Rankin (Stroke Patients Only)       Balance Overall balance assessment: Needs assistance   Sitting balance-Leahy Scale: Poor       Standing balance-Leahy Scale: Poor                               Pertinent Vitals/Pain Pain Assessment: No/denies pain(appears uncomfortable, denies pain)    Home Living Family/patient expects to be discharged to:: Private residence Living Arrangements: Children Available Help at Discharge: Family;Available PRN/intermittently Type of Home: Apartment Home Access: Stairs to enter Entrance Stairs-Rails: (unsure pt couldnt state) Entrance Stairs-Number of Steps: 4-6 Home Layout: One level Home Equipment: None      Prior Function Level of Independence: Independent               Hand Dominance        Extremity/Trunk Assessment   Upper Extremity Assessment Upper Extremity Assessment: Generalized weakness(suspect this is not baseline)    Lower Extremity Assessment Lower Extremity Assessment: Generalized weakness(suspect this is not baseline)       Communication      Cognition Arousal/Alertness: Awake/alert Behavior During Therapy: WFL for tasks assessed/performed Overall Cognitive Status: No family/caregiver present to determine baseline cognitive functioning  General Comments      Exercises     Assessment/Plan    PT Assessment Patient needs continued PT services  PT Problem List Decreased strength;Decreased activity tolerance;Decreased balance       PT Treatment Interventions DME instruction;Stair training;Gait training;Functional mobility training;Therapeutic  activities;Therapeutic exercise;Balance training    PT Goals (Current goals can be found in the Care Plan section)  Acute Rehab PT Goals Patient Stated Goal: non stated PT Goal Formulation: With patient Time For Goal Achievement: 09/30/17 Potential to Achieve Goals: Fair    Frequency Min 3X/week   Barriers to discharge        Co-evaluation               AM-PAC PT "6 Clicks" Daily Activity  Outcome Measure Difficulty turning over in bed (including adjusting bedclothes, sheets and blankets)?: A Lot Difficulty moving from lying on back to sitting on the side of the bed? : A Lot Difficulty sitting down on and standing up from a chair with arms (e.g., wheelchair, bedside commode, etc,.)?: A Lot Help needed moving to and from a bed to chair (including a wheelchair)?: A Lot Help needed walking in hospital room?: A Lot Help needed climbing 3-5 steps with a railing? : A Lot 6 Click Score: 12    End of Session Equipment Utilized During Treatment: Gait belt Activity Tolerance: Patient limited by fatigue;Patient limited by lethargy;Treatment limited secondary to medical complications (Comment) Patient left: in bed;with call bell/phone within reach Nurse Communication: Mobility status PT Visit Diagnosis: Unsteadiness on feet (R26.81);Muscle weakness (generalized) (M62.81);Difficulty in walking, not elsewhere classified (R26.2)    Time: 1027-2536 PT Time Calculation (min) (ACUTE ONLY): 20 min   Charges:   PT Evaluation $PT Eval Moderate Complexity: 1 Mod          Reinaldo Berber, PT, DPT Acute Rehab Services Pager: [phone]    Reinaldo Berber 09/23/2017, 6:04 PM

## 2017-09-23 NOTE — Procedures (Signed)
Interventional Radiology Procedure Note  Procedure: CT guided bone marrow aspiration and biopsy  Complications: None  EBL: < 10 mL  Findings: Aspirate and core biopsy performed of bone marrow in right iliac bone.  Plan: Bedrest supine x 1 hrs  Palak Tercero T. Christyna Letendre, M.D Pager:  [phone]   

## 2017-09-23 NOTE — Progress Notes (Signed)
Inpatient Diabetes Program Recommendations  AACE/ADA: New Consensus Statement on Inpatient Glycemic Control (2015)  Target Ranges:  Prepandial:   less than 140 mg/dL      Peak postprandial:   less than 180 mg/dL (1-2 hours)      Critically ill patients:  140 - 180 mg/dL   No results found for: GLUCAP, HGBA1C  Review of Glycemic Control Results for Debbie Chan, Debbie Chan (MRN [id number]) as of 09/23/2017 11:04  Ref. Range 09/23/2017 04:10  Glucose Latest Ref Range: 70 - 99 mg/dL 60 (L)   Diabetes history: none Outpatient Diabetes medications: none Current orders for Inpatient glycemic control: none  Inpatient Diabetes Program Recommendations:    Given patient experienced hypoglycemia this AM, may want to consider getting routine CBGs TID + HS under Glycemic control order set.   Thanks, Bronson Curb, MSN, RNC-OB Diabetes Coordinator [phone] (8a-5p)

## 2017-09-24 LAB — BASIC METABOLIC PANEL
ANION GAP: 15 (ref 5–15)
Anion gap: 18 — ABNORMAL HIGH (ref 5–15)
BUN: 44 mg/dL — ABNORMAL HIGH (ref 8–23)
BUN: 43 mg/dL — AB (ref 8–23)
CO2: 14 mmol/L — AB (ref 22–32)
CO2: 18 mmol/L — ABNORMAL LOW (ref 22–32)
Calcium: 8.5 mg/dL — ABNORMAL LOW (ref 8.9–10.3)
Calcium: 8.3 mg/dL — ABNORMAL LOW (ref 8.9–10.3)
Chloride: 103 mmol/L (ref 98–111)
Chloride: 100 mmol/L (ref 98–111)
Creatinine, Ser: 2.16 mg/dL — ABNORMAL HIGH (ref 0.44–1.00)
Creatinine, Ser: 2.04 mg/dL — ABNORMAL HIGH (ref 0.44–1.00)
GFR calc Af Amer: 26 mL/min — ABNORMAL LOW (ref >60)
GFR calc Af Amer: 28 mL/min — ABNORMAL LOW (ref >60)
GFR calc non Af Amer: 22 mL/min — ABNORMAL LOW (ref >60)
GFR, EST NON AFRICAN AMERICAN: 24 mL/min — AB (ref >60)
Glucose, Bld: 127 mg/dL — ABNORMAL HIGH (ref 70–99)
Glucose, Bld: 168 mg/dL — ABNORMAL HIGH (ref 70–99)
Potassium: 6.8 mmol/L (ref 3.5–5.1)
Potassium: 5.1 mmol/L (ref 3.5–5.1)
Sodium: 135 mmol/L (ref 135–145)
Sodium: 133 mmol/L — ABNORMAL LOW (ref 135–145)

## 2017-09-24 LAB — GLUCOSE, CAPILLARY
GLUCOSE-CAPILLARY: 24 mg/dL — AB (ref 70–99)
GLUCOSE-CAPILLARY: 92 mg/dL (ref 70–99)
GLUCOSE-CAPILLARY: 114 mg/dL — AB (ref 70–99)
GLUCOSE-CAPILLARY: 129 mg/dL — AB (ref 70–99)
GLUCOSE-CAPILLARY: 172 mg/dL — AB (ref 70–99)
Glucose-Capillary: 65 mg/dL — ABNORMAL LOW (ref 70–99)
Glucose-Capillary: 142 mg/dL — ABNORMAL HIGH (ref 70–99)
Glucose-Capillary: 142 mg/dL — ABNORMAL HIGH (ref 70–99)
Glucose-Capillary: 175 mg/dL — ABNORMAL HIGH (ref 70–99)

## 2017-09-24 LAB — KAPPA/LAMBDA LIGHT CHAINS
Kappa free light chain: 10.5 mg/L (ref 3.3–19.4)
Kappa, lambda light chain ratio: 0.01 — ABNORMAL LOW (ref 0.26–1.65)
Lambda free light chains: 1067.8 mg/L — ABNORMAL HIGH (ref 5.7–26.3)

## 2017-09-24 LAB — CBC WITH DIFFERENTIAL/PLATELET
Basophils Absolute: 0 10*3/uL (ref 0.0–0.1)
Basophils Relative: 0 %
Eosinophils Absolute: 0 10*3/uL (ref 0.0–0.7)
Eosinophils Relative: 0 %
HCT: 27.6 % — ABNORMAL LOW (ref 36.0–46.0)
Hemoglobin: 8.4 g/dL — ABNORMAL LOW (ref 12.0–15.0)
Lymphocytes Relative: 12 %
Lymphs Abs: 1.4 10*3/uL (ref 0.7–4.0)
MCH: 27.4 pg (ref 26.0–34.0)
MCHC: 30.4 g/dL (ref 30.0–36.0)
MCV: 89.9 fL (ref 78.0–100.0)
Monocytes Absolute: 0.9 10*3/uL (ref 0.1–1.0)
Monocytes Relative: 8 %
Neutro Abs: 9 10*3/uL — ABNORMAL HIGH (ref 1.7–7.7)
Neutrophils Relative %: 80 %
Platelets: 50 10*3/uL — ABNORMAL LOW (ref 150–400)
RBC: 3.07 MIL/uL — ABNORMAL LOW (ref 3.87–5.11)
RDW: 21.6 % — ABNORMAL HIGH (ref 11.5–15.5)
WBC: 11.3 10*3/uL — ABNORMAL HIGH (ref 4.0–10.5)
nRBC: 13 /100{WBCs} — ABNORMAL HIGH

## 2017-09-24 LAB — AMMONIA: AMMONIA: 56 umol/L — AB (ref 9–35)

## 2017-09-24 LAB — COMPREHENSIVE METABOLIC PANEL
ALBUMIN: 2.1 g/dL — AB (ref 3.5–5.0)
ALT: 712 U/L — AB (ref 0–44)
AST: 1235 U/L — AB (ref 15–41)
Alkaline Phosphatase: 86 U/L (ref 38–126)
Anion gap: 24 — ABNORMAL HIGH (ref 5–15)
BUN: 37 mg/dL — AB (ref 8–23)
CHLORIDE: 99 mmol/L (ref 98–111)
CO2: 13 mmol/L — AB (ref 22–32)
CREATININE: 2.47 mg/dL — AB (ref 0.44–1.00)
Calcium: 9.2 mg/dL (ref 8.9–10.3)
GFR calc Af Amer: 22 mL/min — ABNORMAL LOW (ref >60)
GFR, EST NON AFRICAN AMERICAN: 19 mL/min — AB (ref >60)
GLUCOSE: 41 mg/dL — AB (ref 70–99)
Potassium: 6.1 mmol/L — ABNORMAL HIGH (ref 3.5–5.1)
SODIUM: 136 mmol/L (ref 135–145)
Total Bilirubin: 2.5 mg/dL — ABNORMAL HIGH (ref 0.3–1.2)
Total Protein: 10.3 g/dL — ABNORMAL HIGH (ref 6.5–8.1)

## 2017-09-24 LAB — PROTIME-INR
INR: 4.34
Prothrombin Time: 41.2 seconds — ABNORMAL HIGH (ref 11.4–15.2)

## 2017-09-24 LAB — PATHOLOGIST SMEAR REVIEW

## 2017-09-24 MED ORDER — SODIUM POLYSTYRENE SULFONATE PO POWD
30.0000 g | ORAL | Status: AC
  Administered 2017-09-24 – 2017-09-25 (×4): 30 g via ORAL
  Filled 2017-09-24 (×4): qty 30

## 2017-09-24 MED ORDER — DEXTROSE 50 % IV SOLN
1.0000 | Freq: Once | INTRAVENOUS | Status: AC
  Administered 2017-09-24: 50 mL via INTRAVENOUS
  Filled 2017-09-24: qty 50

## 2017-09-24 MED ORDER — INSULIN ASPART 100 UNIT/ML IV SOLN
10.0000 [IU] | Freq: Once | INTRAVENOUS | Status: AC
  Administered 2017-09-24: 10 [IU] via INTRAVENOUS
  Filled 2017-09-24: qty 0.1

## 2017-09-24 MED ORDER — DEXAMETHASONE 6 MG PO TABS
40.0000 mg | ORAL_TABLET | ORAL | Status: DC
  Administered 2017-09-24: 40 mg via ORAL
  Filled 2017-09-24: qty 1

## 2017-09-24 MED ORDER — DEXTROSE-NACL 5-0.45 % IV SOLN
INTRAVENOUS | Status: DC
  Administered 2017-09-24 – 2017-09-25 (×3): via INTRAVENOUS

## 2017-09-24 MED ORDER — CALCITONIN (SALMON) 200 UNIT/ML IJ SOLN
360.0000 [IU] | Freq: Two times a day (BID) | INTRAMUSCULAR | Status: AC
  Administered 2017-09-24 – 2017-09-25 (×4): 360 [IU] via SUBCUTANEOUS
  Filled 2017-09-24 (×4): qty 1.8

## 2017-09-24 MED ORDER — DEXTROSE 50 % IV SOLN
INTRAVENOUS | Status: AC
  Administered 2017-09-24: 50 mL
  Filled 2017-09-24: qty 50

## 2017-09-24 MED ORDER — LACTULOSE 10 GM/15ML PO SOLN
30.0000 g | Freq: Three times a day (TID) | ORAL | Status: DC
  Administered 2017-09-24 – 2017-09-26 (×6): 30 g via ORAL
  Filled 2017-09-24 (×6): qty 45

## 2017-09-24 NOTE — Progress Notes (Signed)
Physical Therapy Treatment Patient Details Name: Debbie Chan MRN: [id number] DOB: 16-Feb-1951 Today's Date: 09/24/2017    History of Present Illness 67 year old female who presented with dyspnea and chest pain.  With no significant past medical history, complaint of left lower rib cage chest pain, and dyspnea for the last 3 weeks.  She was diagnosed as an outpatient with a pleural effusion.    PT Comments    Patient with poor progress today. Per nursing has walked some in room to commode, however unable to with therapy this session due to lethargy. Pt in and out of sleep, collapsing onto bed from standing. Not at baseline mobility, will cont to follow in hopes she can begin ambulating again and safely return home.    Follow Up Recommendations  Home health PT;Supervision/Assistance - 24 hour     Equipment Recommendations  (TBD)    Recommendations for Other Services       Precautions / Restrictions Precautions Precautions: Fall Restrictions Weight Bearing Restrictions: No    Mobility  Bed Mobility Overal bed mobility: Needs Assistance Bed Mobility: Supine to Sit     Supine to sit: Min guard     General bed mobility comments: increased time and effort, able to do wtihout physical assistance this session  Transfers Overall transfer level: Needs assistance Equipment used: Rolling walker (2 wheeled) Transfers: Sit to/from Stand Sit to Stand: Mod assist         General transfer comment: Mod A to stand inside RW. pt too lethargic to take steps and collapses back onto bed.   Ambulation/Gait                 Stairs             Wheelchair Mobility    Modified Rankin (Stroke Patients Only)       Balance Overall balance assessment: Needs assistance   Sitting balance-Leahy Scale: Poor       Standing balance-Leahy Scale: Poor                              Cognition Arousal/Alertness: Lethargic Behavior During Therapy: Flat  affect Overall Cognitive Status: No family/caregiver present to determine baseline cognitive functioning                                        Exercises      General Comments        Pertinent Vitals/Pain Pain Assessment: Faces Faces Pain Scale: Hurts a little bit Pain Location: generalized Pain Descriptors / Indicators: Grimacing Pain Intervention(s): Monitored during session;Limited activity within patient's tolerance    Home Living                      Prior Function            PT Goals (current goals can now be found in the care plan section) Acute Rehab PT Goals Patient Stated Goal: non stated PT Goal Formulation: With patient Time For Goal Achievement: 09/30/17 Potential to Achieve Goals: Fair Progress towards PT goals: Progressing toward goals    Frequency    Min 3X/week      PT Plan Current plan remains appropriate    Co-evaluation              AM-PAC PT "6 Clicks" Daily Activity  Outcome Measure  Difficulty  turning over in bed (including adjusting bedclothes, sheets and blankets)?: Unable Difficulty moving from lying on back to sitting on the side of the bed? : Unable Difficulty sitting down on and standing up from a chair with arms (e.g., wheelchair, bedside commode, etc,.)?: Unable Help needed moving to and from a bed to chair (including a wheelchair)?: Total Help needed walking in hospital room?: Total Help needed climbing 3-5 steps with a railing? : Total 6 Click Score: 6    End of Session Equipment Utilized During Treatment: Gait belt Activity Tolerance: Patient limited by fatigue;Patient limited by lethargy;Treatment limited secondary to medical complications (Comment) Patient left: in bed;with call bell/phone within reach Nurse Communication: Mobility status PT Visit Diagnosis: Unsteadiness on feet (R26.81);Muscle weakness (generalized) (M62.81);Difficulty in walking, not elsewhere classified (R26.2)      Time: 1550-1605 PT Time Calculation (min) (ACUTE ONLY): 15 min  Charges:  $Therapeutic Activity: 8-22 mins                     Reinaldo Berber, PT, DPT Acute Rehab Services Pager: [phone]     Reinaldo Berber 09/24/2017, 4:09 PM

## 2017-09-24 NOTE — Progress Notes (Signed)
Late entry.   Pt continues to be somnolent w/o changes since this AM. Son at bedside and concerned about patient. Arrien, MD paged about patient status. VSS. RN will continue to monitor.

## 2017-09-24 NOTE — Progress Notes (Signed)
CRITICAL VALUE ALERT  Critical Value:  PT 41.2/INR 4.34  Date & Time Notied:  09/24/17 0725  Provider Notified: Cathlean Sauer, MD  Orders Received/Actions taken: No new orders at this time. Will continue to monitor.

## 2017-09-24 NOTE — Plan of Care (Signed)
  Problem: Education: Goal: Knowledge of General Education information will improve Description Including pain rating scale, medication(s)/side effects and non-pharmacologic comfort measures Outcome: Progressing   Problem: Health Behavior/Discharge Planning: Goal: Ability to manage health-related needs will improve Outcome: Progressing   Problem: Clinical Measurements: Goal: Will remain free from infection Outcome: Progressing Goal: Diagnostic test results will improve Outcome: Progressing Goal: Respiratory complications will improve Outcome: Progressing Goal: Cardiovascular complication will be avoided Outcome: Progressing   Problem: Coping: Goal: Level of anxiety will decrease Outcome: Progressing   Problem: Elimination: Goal: Will not experience complications related to bowel motility Outcome: Progressing Goal: Will not experience complications related to urinary retention Outcome: Progressing   Problem: Pain Managment: Goal: General experience of comfort will improve Outcome: Progressing   Problem: Safety: Goal: Ability to remain free from injury will improve Outcome: Progressing   Problem: Skin Integrity: Goal: Risk for impaired skin integrity will decrease Outcome: Progressing   Problem: Clinical Measurements: Goal: Ability to maintain clinical measurements within normal limits will improve Outcome: Not Progressing   Problem: Activity: Goal: Risk for activity intolerance will decrease Outcome: Not Progressing   Problem: Nutrition: Goal: Adequate nutrition will be maintained Outcome: Not Progressing

## 2017-09-24 NOTE — Progress Notes (Signed)
Late entry.  CRITICAL VALUE ALERT  Critical Value:  Potassium 6.8  Date & Time Notied:  09/24/17 1613  Provider Notified: Cathlean Sauer, MD  Orders Received/Actions taken: D5 and insulin ordered. RN will continue to monitor.

## 2017-09-24 NOTE — Progress Notes (Signed)
PROGRESS NOTE    Debbie Chan  XTK:[id number] DOB: December 17, 1950 DOA: 09/19/2017 PCP: [name], MD    Brief Narrative:  67 year old female who presented with dyspnea and chest pain.  With no significant past medical history, complaint of left lower rib cage chest pain, and dyspnea for the last 3 weeks.  She was diagnosed as an outpatient with a pleural effusion.  Denies any fever or chills.  On initial physical examination blood pressure 135/99, heart rate 138, temperature 97.5, respirate 26, oxygen saturation 100%.  Moist mucous membranes, lungs clear to auscultation bilaterally, heart S1-S2 present rhythmic, tachycardic, abdomen soft nontender, no lower extremity edema.  Patient was admitted to the hospital working diagnosis of atrial flutter with rapid ventricular response.    Assessment & Plan:   Principal Problem:   Chest pain Active Problems:   Tachycardia   Lytic bone lesions on xray   Anemia   AKI (acute kidney injury) (HCC)   Pleural effusion on right   Hepatomegaly   Atrial flutter (HCC)   Multiple myeloma without remission ([place])   1. Paroxysmal atrial flutter. On metoprolol 12.5 mg po bid for rate control, not on anticoagulation due to coagulopathy.   2. Multiple myeloma (positive M spike). Patient with worsening renal failure and hypercalcemia. Bone marrow pending, will follow with oncology for further recommendations. Unable to do CT of the abdoment with contrast due to elevated cr.   3. Worsening AKI with hyperkalemia/ hypercalcemia. Worsening renal function, clinically dry despite JVD, possible increased venous pressure on the right related to liver congestion. Will add IV fluids at 100 ml per hour, monitor urine output and calcitonin, unable to use bisphosphonate due to low GFR.    4. Worsening elevated liver enzymes/ acute liver failure. Persistent elevation AST 1,235 and ALT 712. Will check ammonia level today, avoid hepatotoxic medications, liver US with  liver lesions. Not able to do contrast imaging due to renal failure. Positive coagulopathy with INR at 4,34.   5. Anemia with thrombocytopenia. Persistent anemia and thrombocytopenia. Hb at 8,4 and plt at 50.   6. HTN. Add IV fluids at 100 ml per hour, hold on antihypertensive medications  7. New hypoglycemia. Patient with poor oral intake and worsening renal function, now with severe hypoglycemia, had amp of d50 and will add d51/2 ns at 100 ml per hour. Continue capillary glucose monmitoring q 2 hours.      DVT prophylaxis: heparin   Code Status: full Family Communication: no family at the bedside  Disposition Plan/ discharge barriers: pending bone marrow biopsy.    Consultants:   Oncology   Procedures:   Bone marrow biopsy   Antimicrobials:      Subjective: Patient developed hypoglycemia, persistent somnolence, now with severe hypoglycemia, poor oral intake.   Objective: Vitals:   09/23/17 2351 09/24/17 0200 09/24/17 0309 09/24/17 0729  BP: (!) 114/43 (!) 110/56 (!) 111/59 (!) 114/57  Pulse: 70  74 73  Resp: '18 15 19   ' Temp: (!) 96.7 F (35.9 C)  (!) 97.4 F (36.3 C) (!) 97.4 F (36.3 C)  TempSrc: Axillary  Oral Oral  SpO2: 98%  100% 100%  Weight:   91 kg (200 lb 11.2 oz)   Height:        Intake/Output Summary (Last 24 hours) at 09/24/2017 0826 Last data filed at 09/23/2017 0900 Gross per 24 hour  Intake 30 ml  Output -  Net 30 ml   Filed Weights   09/22/17 0306 09/23/17 0425 09/24/17  0309  Weight: 87.2 kg (192 lb 3.9 oz) 90.3 kg (199 lb 1.2 oz) 91 kg (200 lb 11.2 oz)    Examination:   General: deconditioned and ill looking appearing.  Neurology: patient is somnolent and less reactive, responds to vice and touch.  E ENT: positive pallor, no icterus, oral mucosa moist Cardiovascular: No JVD. S1-S2 present, rhythmic, no gallops, rubs, or murmurs. No lower extremity edema. Pulmonary: decreased breath sounds bilaterally, adequate air movement, no  wheezing, rhonchi or rales. Gastrointestinal. Abdomen with no organomegaly, non tender, no rebound or guarding Skin. No rashes Musculoskeletal: no joint deformities     Data Reviewed: I have personally reviewed following labs and imaging studies  CBC: Recent Labs  Lab 09/20/17 0632 09/21/17 0231 09/22/17 0310 09/23/17 0410 09/24/17 0554  WBC 12.9* 8.0 9.0 9.7 11.3*  NEUTROABS  --   --   --  6.4 9.0*  HGB 8.9* 8.3* 8.2* 9.3* 8.4*  HCT 28.5* 25.9* 25.4* 29.9* 27.6*  MCV 87.2 84.9 85.5 87.9 89.9  PLT 76* 72* 59* 43* 50*   Basic Metabolic Panel: Recent Labs  Lab 09/19/17 1123  09/20/17 1822 09/21/17 0231 09/22/17 0310 09/23/17 0410 09/24/17 0554  NA 136   < > 133* 134* 138 135 136  K 4.4   < > 5.2* 4.5 4.2 5.1 6.1*  CL 101   < > 98 100 106 102 99  CO2 22   < > 14* 21* 22 13* 13*  GLUCOSE 109*   < > 96 101* 100* 60* 41*  BUN 15   < > 26* 28* 25* 27* 37*  CREATININE 1.45*   < > 2.21* 2.20* 1.83* 1.68* 2.47*  CALCIUM 9.2   < > 8.9 8.3* 8.4* 8.4* 9.2  MG 2.1  --   --   --   --   --   --    < > = values in this interval not displayed.   GFR: Estimated Creatinine Clearance: 26.1 mL/min (A) (by C-G formula based on SCr of 2.47 mg/dL (H)). Liver Function Tests: Recent Labs  Lab 09/19/17 1123 09/21/17 0231 09/22/17 0310 09/23/17 0410 09/24/17 0554  AST 65* 389* 656* 838* 1,235*  ALT 42 197* 380* 562* 712*  ALKPHOS 69 78 81 82 86  BILITOT 0.7 0.9 0.9 1.5* 2.5*  PROT 10.8* 9.5* 9.7* 10.3* 10.3*  ALBUMIN 2.5* 2.1* 2.2* 2.2* 2.1*   No results for input(s): LIPASE, AMYLASE in the last 168 hours. No results for input(s): AMMONIA in the last 168 hours. Coagulation Profile: Recent Labs  Lab 09/20/17 0632 09/22/17 0310 09/23/17 0410 09/24/17 0554  INR 1.91 2.26 2.37 4.34*   Cardiac Enzymes: Recent Labs  Lab 09/19/17 1702 09/20/17 0005 09/20/17 0632  TROPONINI <0.03 <0.03 <0.03   BNP (last 3 results) No results for input(s): PROBNP in the last 8760  hours. HbA1C: No results for input(s): HGBA1C in the last 72 hours. CBG: Recent Labs  Lab 09/24/17 0759  GLUCAP 65*   Lipid Profile: No results for input(s): CHOL, HDL, LDLCALC, TRIG, CHOLHDL, LDLDIRECT in the last 72 hours. Thyroid Function Tests: No results for input(s): TSH, T4TOTAL, FREET4, T3FREE, THYROIDAB in the last 72 hours. Anemia Panel: No results for input(s): VITAMINB12, FOLATE, FERRITIN, TIBC, IRON, RETICCTPCT in the last 72 hours.    Radiology Studies: I have reviewed all of the imaging during this hospital visit personally     Scheduled Meds: . metoprolol tartrate  12.5 mg Oral BID  . polyethylene glycol  17  g Oral Daily   Continuous Infusions:   LOS: 5 days        Nyaira Hodgens Gerome Apley, MD [facility] Pager [phone]

## 2017-09-24 NOTE — Progress Notes (Signed)
CBG 18 rechecked 24, MD paged. Patient awake but somnolent. Amp D50 given, MD order for fluids ordered. Will monitor.

## 2017-09-24 NOTE — Progress Notes (Signed)
Patient continues to be somnolent, will continue aggressive treatment for hyperkalemia, follow-up BMP at 2100 hrs.  Elevated ammonia level will add lactulose therapy.  Case discussed with Dr. Earlie Server from oncology, will start patient on Decadron 40 mg weekly.  Follow up LFTs in the morning.  She does have coagulopathy and encephalopathy, consistent with liver failure.  I spoke with her son at bedside and all questions were addressed.

## 2017-09-24 NOTE — Progress Notes (Signed)
Hypoglycemic Event  CBG:41  Treatment: 4 oz orange juice X2  Symptoms: fatigue  Follow-up CBG: Time: pending CBG Result: pending  Possible Reasons for Event: Inadequate Po intake      Debbie Chan N

## 2017-09-24 NOTE — Progress Notes (Signed)
CRITICAL VALUE ALERT  Critical Value: CBG 65  Date & Time Notied: 0801 09/24/17  Provider Notified: Cathlean Sauer, MD  Orders Received/Actions taken: MD at bedside, no new orders. Pt on D5 1/2 NS at 75 cc/hr, going to recheck sugar after another hour.

## 2017-09-24 NOTE — Plan of Care (Signed)

## 2017-09-25 LAB — GLUCOSE, CAPILLARY
GLUCOSE-CAPILLARY: 190 mg/dL — AB (ref 70–99)
GLUCOSE-CAPILLARY: 195 mg/dL — AB (ref 70–99)
GLUCOSE-CAPILLARY: 196 mg/dL — AB (ref 70–99)
GLUCOSE-CAPILLARY: 201 mg/dL — AB (ref 70–99)
Glucose-Capillary: 18 mg/dL — CL (ref 70–99)
Glucose-Capillary: 235 mg/dL — ABNORMAL HIGH (ref 70–99)
Glucose-Capillary: 236 mg/dL — ABNORMAL HIGH (ref 70–99)
Glucose-Capillary: 186 mg/dL — ABNORMAL HIGH (ref 70–99)
Glucose-Capillary: 210 mg/dL — ABNORMAL HIGH (ref 70–99)
Glucose-Capillary: 217 mg/dL — ABNORMAL HIGH (ref 70–99)

## 2017-09-25 LAB — CBC WITH DIFFERENTIAL/PLATELET
BASOS ABS: 0 10*3/uL (ref 0.0–0.1)
Basophils Relative: 0 %
EOS ABS: 0 10*3/uL (ref 0.0–0.7)
Eosinophils Relative: 0 %
HCT: 23.9 % — ABNORMAL LOW (ref 36.0–46.0)
Hemoglobin: 7.7 g/dL — ABNORMAL LOW (ref 12.0–15.0)
LYMPHS ABS: 1.1 10*3/uL (ref 0.7–4.0)
Lymphocytes Relative: 14 %
MCH: 26.9 pg (ref 26.0–34.0)
MCHC: 32.2 g/dL (ref 30.0–36.0)
MCV: 83.6 fL (ref 78.0–100.0)
MONO ABS: 0.2 10*3/uL (ref 0.1–1.0)
Monocytes Relative: 3 %
NEUTROS PCT: 83 %
Neutro Abs: 6.9 10*3/uL (ref 1.7–7.7)
PLATELETS: 52 10*3/uL — AB (ref 150–400)
RBC: 2.86 MIL/uL — ABNORMAL LOW (ref 3.87–5.11)
RDW: 20.6 % — AB (ref 11.5–15.5)
WBC: 8.2 10*3/uL (ref 4.0–10.5)

## 2017-09-25 LAB — HEPATIC FUNCTION PANEL
ALBUMIN: 1.8 g/dL — AB (ref 3.5–5.0)
ALK PHOS: 78 U/L (ref 38–126)
ALT: 766 U/L — ABNORMAL HIGH (ref 0–44)
AST: 1215 U/L — AB (ref 15–41)
BILIRUBIN TOTAL: 1.6 mg/dL — AB (ref 0.3–1.2)
Bilirubin, Direct: 0.8 mg/dL — ABNORMAL HIGH (ref 0.0–0.2)
Indirect Bilirubin: 0.8 mg/dL (ref 0.3–0.9)
TOTAL PROTEIN: 9.3 g/dL — AB (ref 6.5–8.1)

## 2017-09-25 LAB — BASIC METABOLIC PANEL
Anion gap: 10 (ref 5–15)
BUN: 45 mg/dL — ABNORMAL HIGH (ref 8–23)
CHLORIDE: 101 mmol/L (ref 98–111)
CO2: 23 mmol/L (ref 22–32)
Calcium: 8.1 mg/dL — ABNORMAL LOW (ref 8.9–10.3)
Creatinine, Ser: 1.78 mg/dL — ABNORMAL HIGH (ref 0.44–1.00)
GFR calc non Af Amer: 28 mL/min — ABNORMAL LOW (ref >60)
GFR, EST AFRICAN AMERICAN: 33 mL/min — AB (ref >60)
Glucose, Bld: 201 mg/dL — ABNORMAL HIGH (ref 70–99)
POTASSIUM: 5.2 mmol/L — AB (ref 3.5–5.1)
SODIUM: 134 mmol/L — AB (ref 135–145)

## 2017-09-25 LAB — PROTIME-INR
INR: 3.45
PROTHROMBIN TIME: 34.5 s — AB (ref 11.4–15.2)

## 2017-09-25 MED ORDER — SODIUM CHLORIDE 0.45 % IV SOLN
INTRAVENOUS | Status: DC
  Administered 2017-09-25: 09:00:00 via INTRAVENOUS

## 2017-09-25 NOTE — Care Management Note (Addendum)
Case Management Note  Patient Details  Name: Debbie Chan MRN: [id number] Date of Birth: 1950/06/11  Subjective/Objective:   Pt admitted with CP - now determined to be CA                 Action/Plan:  PTA independent from [place] - pt is visiting son and daughter in law living in [place].  Pt has been in the [place] since May 2019.  Pt does not have [place] citizen ship and is not covered by insurance - daughter in law to arrange for her church clinic doctor to preside as PCP until another one can be established.  Oncologist at bedside during assessment- plan is as follows ; pt will discharge home to care of family and will have follow up arranged by Dr Mora Appl at [facility].  Per oncologist - pt will receive all necessary medications and treatments under internal CA charity program.     Expected Discharge Date:  09/23/17               Expected Discharge Plan:  Home/Self Care  In-House Referral:     Discharge planning Services  CM Consult  Post Acute Care Choice:    Choice offered to:  Patient  DME Arranged:  3-N-1, Walker rolling DME Agency:     HH Arranged:  PT [place]:  [facility]  Status of Service:  In process, will continue to follow  If discussed at Long Length of Stay Meetings, dates discussed:    Additional Comments: 09/25/2017 Pt recommended for [facility]OT - [facility] will accept pt as charity but can only provide HHPT due to resource constraint.  Family in agreement with PT - family also understanding that OT can not be offered and have commited to working with pt on ADLs to ensure pt remains safe in the home  ( Daughter-in-Law is a Therapist, sports in the home and will be helping to take car of the pt).  [facility] will accept DME referrals via charity with the exeption of tub bench.  Pt will discharge home with family, family to follow up with [facility].  Family/Friends of family and church memebers will ensure 24 hour supervision.  Pt may require MATCH at discharge.  CM will continue to follow  for discharge needs  10/24/17 CM informed by bedside nurse that pt will be ambulated on regular basis to ensure pt is optimized prior to discharge with mobility. Maryclare Labrador, RN 09/25/2017, 2:32 PM

## 2017-09-25 NOTE — Progress Notes (Signed)
PROGRESS NOTE    Debbie Chan  TGY:[id number] DOB: 05/25/1950 DOA: 09/19/2017 PCP: [name], MD    Brief Narrative:  67 year old female who presented with dyspnea and chest pain. With no significant past medical history, complaint of left lower rib cage chest pain, and dyspnea for the last 3 weeks. She was diagnosed as an outpatient with a pleural effusion. Denies any fever or chills. On initial physical examination blood pressure 135/99, heart rate 138, temperature 97.5, respirate 26, oxygen saturation 100%. Moist mucous membranes, lungs clear to auscultation bilaterally, heart S1-S2 present rhythmic, tachycardic, abdomen soft nontender, no lower extremity edema.  Patient was admitted to the hospital working diagnosis of atrial flutter with rapid ventricular response.   Assessment & Plan:   Principal Problem:   Chest pain Active Problems:   Tachycardia   Lytic bone lesions on xray   Anemia   AKI (acute kidney injury) (HCC)   Pleural effusion on right   Hepatomegaly   Atrial flutter (HCC)   Multiple myeloma without remission ([place])   1. Paroxysmal atrial flutter.Rate has remained well controlled with  metoprolol 12.5 mg po bid. Not on anticoagulation due to elevated INR.   2. Multiple myeloma (positive M spike) with anemia and thrombocytopenia. Had 40 mg of decadron yesterday, continue with calcitonin. Will need outpatient chemotherapy per Dr Julien Nordmann. Hb at 7,7 and plt at 52, will follow cell count, will transfuse prbc when hb less than 7.   3. Worsening AKI with hyperkalemia/ hypercalcemia. Will continue hydration with half normal saline, at 75 ml per hour, renal function today with serum cr down to 1,78, CA at 8,1 Na 134 and K at 5.2, urine output 650 ml over last 24 hours. Continue to follow on renal panel in am. Avoid hypotension or nephrotoxic medications.   4. Worsening elevated liver enzymes/ acute liver failure. Liver enzymes stable with AST 1,215 and ALT  766. INR down to 3,4, will continue supportive medical therapy, lactulose and neuro checks, will wait for further renal recovery before contrast studies.  5. HTN. Blood pressure has remained stable, will continue close monitoring.  7. New hypoglycemia. Capillary glucose in the 200's, will discontinue dextrose and will continue capillary glucose monitoring q 4 hours.   DVT prophylaxis:heparin Code Status:full Family Communication:no family at the bedside Disposition Plan/ discharge barriers:pending bone marrow biopsy.   Consultants:  Oncology  Procedures:  Bone marrow biopsy  Antimicrobials:       Subjective: Patient has remained somnolent last night, but more awake this am, able to respond to simple questions and follow commands.   Objective: Vitals:   09/25/17 0023 09/25/17 0414 09/25/17 0520 09/25/17 0748  BP: (!) 112/58 (!) 141/78  121/71  Pulse: 81 78  78  Resp: _0 Temp: 99.2 F (37.3 C) (!) 97.5 F (36.4 C)  (!) 96.7 F (35.9 C)  TempSrc: Oral Oral  Oral  SpO2: 94% 95%  96%  Weight:   92.3 kg (203 lb 7.8 oz)   Height:        Intake/Output Summary (Last 24 hours) at 09/25/2017 0834 Last data filed at 09/25/2017 0700 Gross per 24 hour  Intake 1428.59 ml  Output 650 ml  Net 778.59 ml   Filed Weights   09/23/17 0425 09/24/17 0309 09/25/17 0520  Weight: 90.3 kg (199 lb 1.2 oz) 91 kg (200 lb 11.2 oz) 92.3 kg (203 lb 7.8 oz)    Examination:   General: deconditioned  Neurology: somnolent, easy to arouse  E ENT: positive pallor, no icterus, oral mucosa moist Cardiovascular: No JVD. S1-S2 present, rhythmic, no gallops, rubs, or murmurs. Positive generalized edema. Pulmonary: decreased breath sounds bilaterally at bases, poor air movement, no wheezing, rhonchi or rales. Gastrointestinal. Abdomen with no organomegaly, non tender, no rebound or guarding Skin. No rashes Musculoskeletal: no joint deformities     Data Reviewed: I have  personally reviewed following labs and imaging studies  CBC: Recent Labs  Lab 09/21/17 0231 09/22/17 0310 09/23/17 0410 09/24/17 0554 09/25/17 0310  WBC 8.0 9.0 9.7 11.3* 8.2  NEUTROABS  --   --  6.4 9.0* 6.9  HGB 8.3* 8.2* 9.3* 8.4* 7.7*  HCT 25.9* 25.4* 29.9* 27.6* 23.9*  MCV 84.9 85.5 87.9 89.9 83.6  PLT 72* 59* 43* 50* 52*   Basic Metabolic Panel: Recent Labs  Lab 09/19/17 1123  09/23/17 0410 09/24/17 0554 09/24/17 1535 09/24/17 2139 09/25/17 0310  NA 136   < > 135 136 135 133* 134*  K 4.4   < > 5.1 6.1* 6.8* 5.1 5.2*  CL 101   < > 102 99 103 100 101  CO2 22   < > 13* 13* 14* 18* 23  GLUCOSE 109*   < > 60* 41* 127* 168* 201*  BUN 15   < > 27* 37* 44* 43* 45*  CREATININE 1.45*   < > 1.68* 2.47* 2.16* 2.04* 1.78*  CALCIUM 9.2   < > 8.4* 9.2 8.5* 8.3* 8.1*  MG 2.1  --   --   --   --   --   --    < > = values in this interval not displayed.   GFR: Estimated Creatinine Clearance: 36.5 mL/min (A) (by C-G formula based on SCr of 1.78 mg/dL (H)). Liver Function Tests: Recent Labs  Lab 09/21/17 0231 09/22/17 0310 09/23/17 0410 09/24/17 0554 09/25/17 0310  AST 389* 656* 838* 1,235* 1,215*  ALT 197* 380* 562* 712* 766*  ALKPHOS 78 81 82 86 78  BILITOT 0.9 0.9 1.5* 2.5* 1.6*  PROT 9.5* 9.7* 10.3* 10.3* 9.3*  ALBUMIN 2.1* 2.2* 2.2* 2.1* 1.8*   No results for input(s): LIPASE, AMYLASE in the last 168 hours. Recent Labs  Lab 09/24/17 1006  AMMONIA 56*   Coagulation Profile: Recent Labs  Lab 09/20/17 0632 09/22/17 0310 09/23/17 0410 09/24/17 0554 09/25/17 0310  INR 1.91 2.26 2.37 4.34* 3.45   Cardiac Enzymes: Recent Labs  Lab 09/19/17 1702 09/20/17 0005 09/20/17 0632  TROPONINI <0.03 <0.03 <0.03   BNP (last 3 results) No results for input(s): PROBNP in the last 8760 hours. HbA1C: No results for input(s): HGBA1C in the last 72 hours. CBG: Recent Labs  Lab 09/24/17 2143 09/25/17 0024 09/25/17 0200 09/25/17 0414 09/25/17 0608  GLUCAP 175*  195* 196* 201* 235*   Lipid Profile: No results for input(s): CHOL, HDL, LDLCALC, TRIG, CHOLHDL, LDLDIRECT in the last 72 hours. Thyroid Function Tests: No results for input(s): TSH, T4TOTAL, FREET4, T3FREE, THYROIDAB in the last 72 hours. Anemia Panel: No results for input(s): VITAMINB12, FOLATE, FERRITIN, TIBC, IRON, RETICCTPCT in the last 72 hours.    Radiology Studies: I have reviewed all of the imaging during this hospital visit personally     Scheduled Meds: . calcitonin  360 Units Subcutaneous BID  . dexamethasone  40 mg Oral Q7 days  . lactulose  30 g Oral TID  . metoprolol tartrate  12.5 mg Oral BID  . polyethylene glycol  17 g Oral Daily   Continuous Infusions: .  sodium chloride       LOS: 6 days        Tawni Millers, MD [facility] Pager [phone]

## 2017-09-25 NOTE — Progress Notes (Signed)
Physical Therapy Treatment Patient Details Name: Debbie Chan MRN: [id number] DOB: [birth year] Today's Date: 09/25/2017    History of Present Illness 67 year old female who presented with dyspnea and chest pain.  With no significant past medical history, complaint of left lower rib cage chest pain, and dyspnea for the last 3 weeks.  She was diagnosed as an outpatient with a pleural effusion.    PT Comments    Patient with continued slow progress with therapy. Per Nursing pt more alert this morning and walked to commode, however by time of PT visit pt was lethargic and somnolent again. Pt did however progress ambulation with me, walking several steps in room with close chair follow and sudden collapse back onto chair from fatigue. No family present at this time to discuss level of assistance.   Follow Up Recommendations  Home health PT;Supervision/Assistance - 24 hour     Equipment Recommendations  Rolling walker with 5" wheels;3in1 (PT)    Recommendations for Other Services       Precautions / Restrictions Precautions Precautions: Fall Restrictions Weight Bearing Restrictions: No    Mobility  Bed Mobility Overal bed mobility: Needs Assistance Bed Mobility: Supine to Sit     Supine to sit: Min guard     General bed mobility comments: poor seated balance  Transfers Overall transfer level: Needs assistance Equipment used: Rolling walker (2 wheeled) Transfers: Sit to/from Stand Sit to Stand: Mod assist         General transfer comment: Mod A to stand inside RW.   Ambulation/Gait Ambulation/Gait assistance: Min assist Gait Distance (Feet): 5 Feet Assistive device: Rolling walker (2 wheeled);None Gait Pattern/deviations: Step-to pattern Gait velocity: decreased   General Gait Details: Min a to stabilize, close chair follow for safety, pt not fully alet and very unsteady.    Stairs             Wheelchair Mobility    Modified Rankin (Stroke Patients  Only)       Balance Overall balance assessment: Needs assistance   Sitting balance-Leahy Scale: Poor       Standing balance-Leahy Scale: Poor                              Cognition Arousal/Alertness: Lethargic Behavior During Therapy: Flat affect Overall Cognitive Status: No family/caregiver present to determine baseline cognitive functioning                                        Exercises      General Comments        Pertinent Vitals/Pain Pain Assessment: Faces Faces Pain Scale: Hurts a little bit Pain Location: generalized Pain Descriptors / Indicators: Grimacing Pain Intervention(s): Limited activity within patient's tolerance    Home Living                      Prior Function            PT Goals (current goals can now be found in the care plan section) Acute Rehab PT Goals Patient Stated Goal: non stated PT Goal Formulation: With patient Time For Goal Achievement: 09/30/17 Potential to Achieve Goals: Fair Progress towards PT goals: Progressing toward goals    Frequency    Min 3X/week      PT Plan Current plan remains appropriate    Co-evaluation  AM-PAC PT "6 Clicks" Daily Activity  Outcome Measure  Difficulty turning over in bed (including adjusting bedclothes, sheets and blankets)?: Unable Difficulty moving from lying on back to sitting on the side of the bed? : Unable Difficulty sitting down on and standing up from a chair with arms (e.g., wheelchair, bedside commode, etc,.)?: Unable Help needed moving to and from a bed to chair (including a wheelchair)?: A Lot Help needed walking in hospital room?: A Lot Help needed climbing 3-5 steps with a railing? : A Lot 6 Click Score: 9    End of Session Equipment Utilized During Treatment: Gait belt Activity Tolerance: Patient limited by fatigue;Patient limited by lethargy;Treatment limited secondary to medical complications  (Comment) Patient left: in bed;with call bell/phone within reach Nurse Communication: Mobility status PT Visit Diagnosis: Unsteadiness on feet (R26.81);Muscle weakness (generalized) (M62.81);Difficulty in walking, not elsewhere classified (R26.2)     Time: 1660-6004 PT Time Calculation (min) (ACUTE ONLY): 15 min  Charges:  $Gait Training: 8-22 mins                     Reinaldo Berber, PT, DPT Acute Rehab Services Pager: [phone]     Reinaldo Berber 09/25/2017, 10:00 AM

## 2017-09-25 NOTE — Evaluation (Signed)
Occupational Therapy Evaluation Patient Details Name: Debbie Chan MRN: [id number] DOB: [birth year] Today's Date: 09/25/2017    History of Present Illness 67 year old female who presented with dyspnea and chest pain.  With no significant past medical history, complaint of left lower rib cage chest pain, and dyspnea for the last 3 weeks.  She was diagnosed as an outpatient with a pleural effusion.  Dx:  multiple Myeloma    Clinical Impression   Pt admitted with above. She demonstrates the below listed deficits and will benefit from continued OT to maximize safety and independence with BADLs.  OT eval limited by lethargy.  Pt presents to OT with generalized weakness, decreased balance, generalized weakness, decreased activity tolerance.  Unable to accurately assess cognition due to lethargy and language barrier.  She required mod A to move to EOB, which improved arousal.  She can perform functional transfers with min guard assist, but requires min - max A for ADLs due to lethargy.   Per chart, pt is visiting from [place].   Recommend 24 hour supervision and HHOT      Follow Up Recommendations  Home health OT;Supervision/Assistance - 24 hour    Equipment Recommendations  3 in 1 bedside commode;Tub/shower bench    Recommendations for Other Services       Precautions / Restrictions Precautions Precautions: Fall      Mobility Bed Mobility Overal bed mobility: Needs Assistance Bed Mobility: Supine to Sit;Sit to Supine     Supine to sit: Supervision Sit to supine: Supervision      Transfers Overall transfer level: Needs assistance Equipment used: None Transfers: Sit to/from [facility] Sit to Stand: Min guard Stand pivot transfers: Min guard       General transfer comment: min guard assist for safety     Balance Overall balance assessment: Needs assistance Sitting-balance support: Feet supported Sitting balance-Leahy Scale: Fair Sitting balance - Comments:  able to maintain static sitting with min guard assist.  Pt very lethargic so requires close supervision      Standing balance-Leahy Scale: Poor Standing balance comment: requires UE support                            ADL either performed or assessed with clinical judgement   ADL Overall ADL's : Needs assistance/impaired Eating/Feeding: Set up   Grooming: Wash/dry hands;Supervision/safety;Sitting   Upper Body Bathing: Total assistance;Sitting   Lower Body Bathing: Total assistance;Sit to/from stand   Upper Body Dressing : Maximal assistance;Sitting   Lower Body Dressing: Total assistance;Sit to/from stand   Toilet Transfer: Min guard;Stand-pivot;BSC   Toileting- Water quality scientist and Hygiene: Total assistance;Sit to/from stand Toileting - Clothing Manipulation Details (indicate cue type and reason): does not spontaneously attempt peri care when prompted or when cloth placed in her hand      Functional mobility during ADLs: Min guard       Vision         Perception     Praxis      Pertinent Vitals/Pain Pain Assessment: Faces Faces Pain Scale: Hurts a little bit Pain Location: generalized Pain Descriptors / Indicators: Grimacing Pain Intervention(s): Monitored during session;Limited activity within patient's tolerance     Hand Dominance (unsure )   Extremity/Trunk Assessment Upper Extremity Assessment Upper Extremity Assessment: Generalized weakness   Lower Extremity Assessment Lower Extremity Assessment: Defer to PT evaluation   Cervical / Trunk Assessment Cervical / Trunk Assessment: Normal   Communication Communication Communication: Prefers  language other than English   Cognition Arousal/Alertness: Lethargic Behavior During Therapy: Flat affect Overall Cognitive Status: Impaired/Different from baseline                                 General Comments: No family present, and language barrier makes accurate assessment  difficult   Pt is very lethargic.  She will follow simple commands in context of familiar ADL tasks, however, sequencing deficits possibly noted.     General Comments  VSS.  No family present     Exercises     Shoulder Instructions      Home Living Family/patient expects to be discharged to:: Private residence Living Arrangements: Children Available Help at Discharge: Family;Available PRN/intermittently Type of Home: Apartment Home Access: Stairs to enter Entrance Stairs-Number of Steps: 4-6   Home Layout: One level     Bathroom Shower/Tub: Teacher, early years/pre: Standard Bathroom Accessibility: Yes   Home Equipment: None          Prior Functioning/Environment Level of Independence: Independent        Comments: Per chart review, pt is from [place], and has been visiting family since 5/19.   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         OT Problem List: Decreased strength;Decreased activity tolerance;Impaired balance (sitting and/or standing);Decreased safety awareness;Decreased knowledge of use of DME or AE      OT Treatment/Interventions: Self-care/ADL training;Therapeutic exercise;DME and/or AE instruction;Therapeutic activities;Patient/family education;Balance training    OT Goals(Current goals can be found in the care plan section) Acute Rehab OT Goals OT Goal Formulation: Patient unable to participate in goal setting Time For Goal Achievement: 10/09/17 Potential to Achieve Goals: Good ADL Goals Pt Will Perform Grooming: with supervision;standing Pt Will Perform Upper Body Bathing: with supervision;sitting Pt Will Perform Lower Body Bathing: with supervision;sit to/from stand Pt Will Perform Upper Body Dressing: with supervision;sitting Pt Will Perform Lower Body Dressing: with supervision;sit to/from stand Pt Will Transfer to Toilet: with supervision;ambulating;grab bars Pt Will Perform Toileting - Clothing Manipulation and  hygiene: with supervision;sit to/from stand Pt Will Perform Tub/Shower Transfer: Tub transfer;with min guard assist;ambulating;tub bench  OT Frequency: Min 2X/week   Barriers to D/C:            Co-evaluation              AM-PAC PT "6 Clicks" Daily Activity     Outcome Measure Help from another person eating meals?: None Help from another person taking care of personal grooming?: A Little Help from another person toileting, which includes using toliet, bedpan, or urinal?: A Little Help from another person bathing (including washing, rinsing, drying)?: A Lot Help from another person to put on and taking off regular upper body clothing?: A Lot Help from another person to put on and taking off regular lower body clothing?: A Lot 6 Click Score: 16   End of Session Nurse Communication: Mobility status  Activity Tolerance: Patient limited by lethargy Patient left: in bed;with call bell/phone within reach  OT Visit Diagnosis: Unsteadiness on feet (R26.81)                Time: 4982-6415 OT Time Calculation (min): 11 min Charges:  OT General Charges $OT Visit: 1 Visit OT Evaluation $OT Eval Moderate Complexity: 1 Mod  [place], OTR/L [phone]   Lucille Passy M 09/25/2017, 1:37 PM

## 2017-09-26 LAB — GLUCOSE, CAPILLARY
GLUCOSE-CAPILLARY: 240 mg/dL — AB (ref 70–99)
GLUCOSE-CAPILLARY: 272 mg/dL — AB (ref 70–99)
Glucose-Capillary: 214 mg/dL — ABNORMAL HIGH (ref 70–99)
Glucose-Capillary: 212 mg/dL — ABNORMAL HIGH (ref 70–99)
Glucose-Capillary: 317 mg/dL — ABNORMAL HIGH (ref 70–99)

## 2017-09-26 LAB — CBC WITH DIFFERENTIAL/PLATELET
Basophils Absolute: 0 10*3/uL (ref 0.0–0.1)
Basophils Relative: 0 %
EOS PCT: 0 %
Eosinophils Absolute: 0 10*3/uL (ref 0.0–0.7)
HEMATOCRIT: 23.9 % — AB (ref 36.0–46.0)
HEMOGLOBIN: 7.8 g/dL — AB (ref 12.0–15.0)
LYMPHS ABS: 0.5 10*3/uL — AB (ref 0.7–4.0)
Lymphocytes Relative: 5 %
MCH: 27.3 pg (ref 26.0–34.0)
MCHC: 32.6 g/dL (ref 30.0–36.0)
MCV: 83.6 fL (ref 78.0–100.0)
MONOS PCT: 5 %
Monocytes Absolute: 0.5 10*3/uL (ref 0.1–1.0)
NEUTROS PCT: 90 %
Neutro Abs: 8.6 10*3/uL — ABNORMAL HIGH (ref 1.7–7.7)
Platelets: 44 10*3/uL — ABNORMAL LOW (ref 150–400)
RBC: 2.86 MIL/uL — AB (ref 3.87–5.11)
RDW: 21 % — AB (ref 11.5–15.5)
WBC: 9.6 10*3/uL (ref 4.0–10.5)

## 2017-09-26 LAB — HEPATIC FUNCTION PANEL
ALBUMIN: 1.8 g/dL — AB (ref 3.5–5.0)
ALK PHOS: 81 U/L (ref 38–126)
ALT: 583 U/L — ABNORMAL HIGH (ref 0–44)
AST: 685 U/L — AB (ref 15–41)
BILIRUBIN TOTAL: 1.8 mg/dL — AB (ref 0.3–1.2)
Bilirubin, Direct: 0.8 mg/dL — ABNORMAL HIGH (ref 0.0–0.2)
Indirect Bilirubin: 1 mg/dL — ABNORMAL HIGH (ref 0.3–0.9)
TOTAL PROTEIN: 9.4 g/dL — AB (ref 6.5–8.1)

## 2017-09-26 LAB — PROTIME-INR
INR: 2.62
PROTHROMBIN TIME: 27.8 s — AB (ref 11.4–15.2)

## 2017-09-26 LAB — BASIC METABOLIC PANEL
Anion gap: 14 (ref 5–15)
BUN: 34 mg/dL — ABNORMAL HIGH (ref 8–23)
CHLORIDE: 101 mmol/L (ref 98–111)
CO2: 24 mmol/L (ref 22–32)
CREATININE: 1.17 mg/dL — AB (ref 0.44–1.00)
Calcium: 8.3 mg/dL — ABNORMAL LOW (ref 8.9–10.3)
GFR calc Af Amer: 55 mL/min — ABNORMAL LOW (ref >60)
GFR calc non Af Amer: 47 mL/min — ABNORMAL LOW (ref >60)
Glucose, Bld: 227 mg/dL — ABNORMAL HIGH (ref 70–99)
POTASSIUM: 3.5 mmol/L (ref 3.5–5.1)
Sodium: 139 mmol/L (ref 135–145)

## 2017-09-26 LAB — AMMONIA: Ammonia: 53 umol/L — ABNORMAL HIGH (ref 9–35)

## 2017-09-26 MED ORDER — ENSURE ENLIVE PO LIQD
237.0000 mL | Freq: Two times a day (BID) | ORAL | Status: DC
  Administered 2017-09-26 – 2017-09-28 (×5): 237 mL via ORAL

## 2017-09-26 NOTE — Progress Notes (Signed)
Initial Nutrition Assessment  DOCUMENTATION CODES:   Obesity unspecified  INTERVENTION:  - Will order Ensure Enlive po BID, each supplement provides 350 kcal and 20 grams of protein - Continue to encourage PO intakes.  - Will route note to [facility] RD.   NUTRITION DIAGNOSIS:   Increased nutrient needs related to catabolic illness, cancer and cancer related treatments as evidenced by estimated needs.  GOAL:   Patient will meet greater than or equal to 90% of their needs  MONITOR:   PO intake, Supplement acceptance, Weight trends, Labs, I & O's  REASON FOR ASSESSMENT:   Consult Assessment of nutrition requirement/status  ASSESSMENT:   67 year old female who presented with dyspnea and chest pain.  With no significant past medical history, complaint of left lower rib cage chest pain, and dyspnea for the last 3 weeks.  She was diagnosed as an outpatient with a pleural effusion.  Patient admitted on 7/27 and has had a few periods of NPO since that time. She is currently on Heart Healthy diet and ate 100% of breakfast and 80% of lunch yesterday (total of 1181 kcal, 53 grams of protein). Patient reports that this is the best she has eaten since admission with prior meals being </= 50%. She was able to eat well for breakfast this AM as well and feels that appetite is beginning to return. Noted elevated CBGs but will order Ensure at this time given need for increased kcal and protein. Estimated nutrition needs based on weight on 7/28 (84.4 kg) as weight +17 lbs/7.9 kg since that date. No diuretics currently ordered.  Per Dr. Nolon Lennert note yesterday: dx of multiple myeloma with plan to start outpatient chemo per Dr. Julien Nordmann, worsening AKI, worsening elevated LFTs and acute liver failure, plan to await renal function improvement before any imaging studies related to liver, new hyperglycemia so D5 IVF now d/c'ed.    Medications reviewed; 30 g lactulose TID, 1 packet Miralax/day.  Labs reviewed;  CBGs: 214 and 212 mg/dL this AM, BUN: 34 mg/dL, creatinine: 1.17 mg/dL, Ca: 8.3 mg/dL, LFTs elevated, GFR: 47 mL/min.  IVF: 1/2 NS @ 75 mL/hr.      NUTRITION - FOCUSED PHYSICAL EXAM:  Completed; no muscle and no fat wasting, noted mild edema to extremitites.   Diet Order:   Diet Order           Diet Heart Room service appropriate? Yes; Fluid consistency: Thin  Diet effective now          EDUCATION NEEDS:   No education needs have been identified at this time  Skin:  Skin Assessment: Reviewed RN Assessment  Last BM:  8/2  Height:   Ht Readings from Last 1 Encounters:  09/19/17 '5\' 8"'  (1.727 m)    Weight:   Wt Readings from Last 1 Encounters:  09/26/17 203 lb 7.8 oz (92.3 kg)    Ideal Body Weight:  63.64 kg  BMI:  Body mass index is 30.94 kg/m.  Estimated Nutritional Needs:   Kcal:  9983-3825 (25-28 kcal/kg)  Protein:  105-120 grams  Fluid:  >/= 1.5 L/day     Jarome Matin, MS, RD, LDN, [facility] Inpatient Clinical Dietitian Pager # [phone] After hours/weekend pager # [phone]

## 2017-09-26 NOTE — Progress Notes (Addendum)
PROGRESS NOTE    Debbie Chan  MEQ:[id number] DOB: 04-19-1950 DOA: 09/19/2017 PCP: [name], MD    Brief Narrative:  67 year old female who presented with dyspnea and chest pain. With no significant past medical history, complaint of left lower rib cage chest pain, and dyspnea for the last 3 weeks. She was diagnosed as an outpatient with a pleural effusion. Denies any fever or chills. On initial physical examination blood pressure 135/99, heart rate 138, temperature 97.5, respirate 26, oxygen saturation 100%. Moist mucous membranes, lungs clear to auscultation bilaterally, heart S1-S2 present rhythmic, tachycardic, abdomen soft nontender, no lower extremity edema.  Sodium 136, potassium 4.4, chloride 101, bicarb 22, glucose 109, BUN 15, creatinine 1.45, 9.2, anion gap 13, magnesium 2.1, AST 65, ALT 42 LDH 476, white count 8.1, hemoglobin 10.3, hematocrit 32.4, platelets 80.  Analysis specific gravity 1.011, 30 protein, 5 white cells, 11-20 RBCs.  CT chest with multiple lytic lesions throughout the visualized bones.  3.5 x 7 cm soft tissue mass involving the left seventh rib, moderate right pleural effusion.  Hypodense areas along the liver which may represent subcapsular fluid/masses versus hepatic lesions.  No pulmonary embolism.  Chest x-ray with right pleural effusion.  EKG with fine atrial flutter.  Patient was admitted to the hospital working diagnosis of atrial flutter with rapid ventricular response.  Assessment & Plan:   Principal Problem:   Chest pain Active Problems:   Tachycardia   Lytic bone lesions on xray   Anemia   AKI (acute kidney injury) (HCC)   Pleural effusion on right   Hepatomegaly   Atrial flutter (HCC)   Multiple myeloma without remission ([place])  1. Paroxysmal atrial flutter.Continue sinus rhythm on telemetry, will continue metoprolol fot rate control. Echocardiogram with preserved LV systolic function. Elevated peak PA pressure at 37 mmHg.  2.  Multiple myeloma(positive M spike) with anemia and thrombocytopenia.Clinically patient is more awake and alert, more responsive. Serum calcium down to 8,3 with stable hb and hct, continue thrombocytopenia. Completed calcitonin regimen, and had one dose of decadron 40 mg x1.   3.Pre- renal AKI with hyperkalemia/ hypercalcemia. Will continue hypotonic saline at 50 ml per hour, improved po intake, serum cr today at 1,17 with K at 3,5 and serum bicarbonate at 24, will continue to follow renal panel in am.   4.Worsening elevated liver enzymes/ acute liver failure. Liver enzymes are trending down with ast at 685 and alt at 583, INR at 2,62. Will wait for further renal function recovery before getting abdominal/ pelvic contrast CT. Improved mentation, will discontinue lactulose.   5. HTN.Avoid hypotension, noted worsening edema, will decrease rate of IV fuids to 50 ml per hour.   7. Hypoglycemia.Improved po intake, dextrose has been discontinued, capillary glucose monitoring q 4 hours, will change to ac and qhs. Capillary glucose 186, 190, 210, 217, 214, 212, 240.  DVT prophylaxis:heparin Code Status:full Family Communication:I spoke with patient's daughter in law over the phone yesterday at the bedside and all questions were addressed.  Disposition Plan/ discharge barriers:pending bone marrow biopsy.   Consultants:  Oncology  Procedures:  Bone marrow biopsy  Antimicrobials:     Subjective: Patient is more awake and alert, out of bed, with no chest pain or dyspnea, no nausea or vomiting, positive weakness and edema in her legs and right upper extremity.   Objective: Vitals:   09/26/17 0324 09/26/17 0346 09/26/17 0733 09/26/17 0754  BP: (!) 157/91  (!) 165/91 (!) 150/84  Pulse: 81  80 80  Resp: _0 Temp: (!) 97.5 F (36.4 C)  97.7 F (36.5 C)   TempSrc: Oral  Oral   SpO2: 93%  96% 98%  Weight:  92.3 kg (203 lb 7.8 oz)    Height:         Intake/Output Summary (Last 24 hours) at 09/26/2017 1019 Last data filed at 09/26/2017 0900 Gross per 24 hour  Intake 1524.19 ml  Output -  Net 1524.19 ml   Filed Weights   09/24/17 0309 09/25/17 0520 09/26/17 0346  Weight: 91 kg (200 lb 11.2 oz) 92.3 kg (203 lb 7.8 oz) 92.3 kg (203 lb 7.8 oz)    Examination:   General: deconditioned  Neurology: Awake and alert, non focal  E ENT: mild pallor, no icterus, oral mucosa moist Cardiovascular: No JVD. S1-S2 present, rhythmic, no gallops, rubs, or murmurs. ++ non pitting lower extremity edema, and right upper extremity. Pulmonary: positive breath sounds bilaterally, adequate air movement, no wheezing, rhonchi or rales. Gastrointestinal. Abdomen with no organomegaly, non tender, no rebound or guarding Skin. No rashes Musculoskeletal: no joint deformities     Data Reviewed: I have personally reviewed following labs and imaging studies  CBC: Recent Labs  Lab 09/22/17 0310 09/23/17 0410 09/24/17 0554 09/25/17 0310 09/26/17 0331  WBC 9.0 9.7 11.3* 8.2 9.6  NEUTROABS  --  6.4 9.0* 6.9 8.6*  HGB 8.2* 9.3* 8.4* 7.7* 7.8*  HCT 25.4* 29.9* 27.6* 23.9* 23.9*  MCV 85.5 87.9 89.9 83.6 83.6  PLT 59* 43* 50* 52* 44*   Basic Metabolic Panel: Recent Labs  Lab 09/19/17 1123  09/24/17 0554 09/24/17 1535 09/24/17 2139 09/25/17 0310 09/26/17 0331  NA 136   < > 136 135 133* 134* 139  K 4.4   < > 6.1* 6.8* 5.1 5.2* 3.5  CL 101   < > 99 103 100 101 101  CO2 22   < > 13* 14* 18* 23 24  GLUCOSE 109*   < > 41* 127* 168* 201* 227*  BUN 15   < > 37* 44* 43* 45* 34*  CREATININE 1.45*   < > 2.47* 2.16* 2.04* 1.78* 1.17*  CALCIUM 9.2   < > 9.2 8.5* 8.3* 8.1* 8.3*  MG 2.1  --   --   --   --   --   --    < > = values in this interval not displayed.   GFR: Estimated Creatinine Clearance: 55.5 mL/min (A) (by C-G formula based on SCr of 1.17 mg/dL (H)). Liver Function Tests: Recent Labs  Lab 09/22/17 0310 09/23/17 0410 09/24/17 0554  09/25/17 0310 09/26/17 0331  AST 656* 838* 1,235* 1,215* 685*  ALT 380* 562* 712* 766* 583*  ALKPHOS 81 82 86 78 81  BILITOT 0.9 1.5* 2.5* 1.6* 1.8*  PROT 9.7* 10.3* 10.3* 9.3* 9.4*  ALBUMIN 2.2* 2.2* 2.1* 1.8* 1.8*   No results for input(s): LIPASE, AMYLASE in the last 168 hours. Recent Labs  Lab 09/24/17 1006 09/26/17 0331  AMMONIA 56* 53*   Coagulation Profile: Recent Labs  Lab 09/22/17 0310 09/23/17 0410 09/24/17 0554 09/25/17 0310 09/26/17 0331  INR 2.26 2.37 4.34* 3.45 2.62   Cardiac Enzymes: Recent Labs  Lab 09/19/17 1702 09/20/17 0005 09/20/17 0632  TROPONINI <0.03 <0.03 <0.03   BNP (last 3 results) No results for input(s): PROBNP in the last 8760 hours. HbA1C: No results for input(s): HGBA1C in the last 72 hours. CBG: Recent Labs  Lab 09/25/17 1652 09/25/17 1922 09/25/17 2309  09/26/17 0326 09/26/17 0758  GLUCAP 190* 210* 217* 214* 212*   Lipid Profile: No results for input(s): CHOL, HDL, LDLCALC, TRIG, CHOLHDL, LDLDIRECT in the last 72 hours. Thyroid Function Tests: No results for input(s): TSH, T4TOTAL, FREET4, T3FREE, THYROIDAB in the last 72 hours. Anemia Panel: No results for input(s): VITAMINB12, FOLATE, FERRITIN, TIBC, IRON, RETICCTPCT in the last 72 hours.    Radiology Studies: I have reviewed all of the imaging during this hospital visit personally     Scheduled Meds: . dexamethasone  40 mg Oral Q7 days  . feeding supplement (ENSURE ENLIVE)  237 mL Oral BID BM  . metoprolol tartrate  12.5 mg Oral BID  . polyethylene glycol  17 g Oral Daily   Continuous Infusions: . sodium chloride 75 mL/hr at 09/26/17 0600     LOS: 7 days        Navraj Dreibelbis Gerome Apley, MD [facility] Pager [phone]

## 2017-09-27 DIAGNOSIS — R000 Tachycardia, unspecified: Secondary | ICD-10-CM

## 2017-09-27 LAB — PROTIME-INR
INR: 2.12
PROTHROMBIN TIME: 23.5 s — AB (ref 11.4–15.2)

## 2017-09-27 LAB — BASIC METABOLIC PANEL
ANION GAP: 17 — AB (ref 5–15)
BUN: 29 mg/dL — ABNORMAL HIGH (ref 8–23)
CO2: 25 mmol/L (ref 22–32)
Calcium: 8.6 mg/dL — ABNORMAL LOW (ref 8.9–10.3)
Chloride: 96 mmol/L — ABNORMAL LOW (ref 98–111)
Creatinine, Ser: 1.03 mg/dL — ABNORMAL HIGH (ref 0.44–1.00)
GFR calc Af Amer: >60 mL/min (ref >60)
GFR, EST NON AFRICAN AMERICAN: 55 mL/min — AB (ref >60)
Glucose, Bld: 259 mg/dL — ABNORMAL HIGH (ref 70–99)
Potassium: 3 mmol/L — ABNORMAL LOW (ref 3.5–5.1)
SODIUM: 138 mmol/L (ref 135–145)

## 2017-09-27 LAB — CBC WITH DIFFERENTIAL/PLATELET
BASOS PCT: 0 %
Basophils Absolute: 0 10*3/uL (ref 0.0–0.1)
EOS PCT: 0 %
Eosinophils Absolute: 0 10*3/uL (ref 0.0–0.7)
HCT: 25.1 % — ABNORMAL LOW (ref 36.0–46.0)
HEMOGLOBIN: 8.1 g/dL — AB (ref 12.0–15.0)
Lymphocytes Relative: 7 %
Lymphs Abs: 0.8 10*3/uL (ref 0.7–4.0)
MCH: 27.2 pg (ref 26.0–34.0)
MCHC: 32.3 g/dL (ref 30.0–36.0)
MCV: 84.2 fL (ref 78.0–100.0)
MONO ABS: 0.6 10*3/uL (ref 0.1–1.0)
Monocytes Relative: 5 %
NEUTROS ABS: 10.4 10*3/uL — AB (ref 1.7–7.7)
NEUTROS PCT: 88 %
PLATELETS: 46 10*3/uL — AB (ref 150–400)
RBC: 2.98 MIL/uL — ABNORMAL LOW (ref 3.87–5.11)
RDW: 21.8 % — ABNORMAL HIGH (ref 11.5–15.5)
WBC: 11.8 10*3/uL — ABNORMAL HIGH (ref 4.0–10.5)

## 2017-09-27 LAB — GLUCOSE, CAPILLARY
Glucose-Capillary: 272 mg/dL — ABNORMAL HIGH (ref 70–99)
Glucose-Capillary: 270 mg/dL — ABNORMAL HIGH (ref 70–99)
Glucose-Capillary: 307 mg/dL — ABNORMAL HIGH (ref 70–99)
Glucose-Capillary: 339 mg/dL — ABNORMAL HIGH (ref 70–99)

## 2017-09-27 LAB — HEPATIC FUNCTION PANEL
ALBUMIN: 1.9 g/dL — AB (ref 3.5–5.0)
ALT: 455 U/L — AB (ref 0–44)
AST: 370 U/L — AB (ref 15–41)
Alkaline Phosphatase: 96 U/L (ref 38–126)
Bilirubin, Direct: 0.6 mg/dL — ABNORMAL HIGH (ref 0.0–0.2)
Indirect Bilirubin: 0.9 mg/dL (ref 0.3–0.9)
TOTAL PROTEIN: 9.5 g/dL — AB (ref 6.5–8.1)
Total Bilirubin: 1.5 mg/dL — ABNORMAL HIGH (ref 0.3–1.2)

## 2017-09-27 MED ORDER — POTASSIUM CHLORIDE CRYS ER 20 MEQ PO TBCR
40.0000 meq | EXTENDED_RELEASE_TABLET | Freq: Once | ORAL | Status: AC
  Administered 2017-09-27: 40 meq via ORAL
  Filled 2017-09-27: qty 2

## 2017-09-27 NOTE — Progress Notes (Signed)
PROGRESS NOTE    Debbie Chan  IRC:[id number] DOB: 13-Sep-1950 DOA: 09/19/2017 PCP: [name], MD    Brief Narrative:  67 year old female who presented with dyspnea and chest pain. With no significant past medical history, complaint of left lower rib cage chest pain, and dyspnea for the last 3 weeks. She was diagnosed as an outpatient with a pleural effusion. Denies any fever or chills. On initial physical examination blood pressure 135/99, heart rate 138, temperature 97.5, respirate 26, oxygen saturation 100%. Moist mucous membranes, lungs clear to auscultation bilaterally, heart S1-S2 present rhythmic, tachycardic, abdomen soft nontender, no lower extremity edema.  Sodium 136, potassium 4.4, chloride 101, bicarb 22, glucose 109, BUN 15, creatinine 1.45, 9.2, anion gap 13, magnesium 2.1, AST 65, ALT 42 LDH 476, white count 8.1, hemoglobin 10.3, hematocrit 32.4, platelets 80.  Analysis specific gravity 1.011, 30 protein, 5 white cells, 11-20 RBCs.  CT chest with multiple lytic lesions throughout the visualized bones.  3.5 x 7 cm soft tissue mass involving the left seventh rib, moderate right pleural effusion.  Hypodense areas along the liver which may represent subcapsular fluid/masses versus hepatic lesions.  No pulmonary embolism.  Chest x-ray with right pleural effusion.  EKG with fine atrial flutter.  Patient was admitted to the hospital working diagnosis of atrial flutter with rapid ventricular response.   Assessment & Plan:   Principal Problem:   Chest pain Active Problems:   Tachycardia   Lytic bone lesions on xray   Anemia   AKI (acute kidney injury) (HCC)   Pleural effusion on right   Hepatomegaly   Atrial flutter (HCC)   Multiple myeloma without remission ([place])   1. Paroxysmal atrial flutter, echocardiogram with preserved LV systolic function. Will continue telemetry, continue metoprolol for rate control, will continue to hold on anticoagulation due to coagulopathy.     2. Multiple myeloma(positive M spike)with anemia and thrombocytopenia.Tolerated well high dose of systemic steroids, will need further chemotherapy as outpatient. Will follow on repeat chest film to evaluate right pleural effusion and abdominal US to reassess the liver and for the presence of significant ascites. Ca is stable at 8,6.   3.Pre- renal AKI with hyperkalemia/ hypercalcemia. Renal function continue to improve, now with serum cr down to 1,03 with K at 3,0 and serum bicarbonate at 25. Will add kcl 40 meq x1 and will continue to follow renal panel in am, will hold on further IV fluids to prevent worsening edema.   4.Elevated liver enzymes/ acute liver failure.continue to improve parameters with AST at 370 and ALT at 455. INR at 2,12. Will follow on liver US, depending on result may need further workup with contrast ct of the abdomen.   5.HTN.Off IV fluids, blood pressure 140 to 170 mmHg, at home not taking any medication. Will add low dose amlodipine 5 mg daily if continue to be elevated over next 24 hours.   7. Hypoglycemia.Capillary glucose 240, 271, 317, 272, 270. Will add insulin sliding scale for glucose cover and monitoring,  DVT prophylaxis:heparin Code Status:full Family Communication:I spoke with patient's daughter in law over the phone yesterday at the bedside and all questions were addressed.  Disposition Plan/ discharge barriers:pending bone marrow biopsy.   Consultants:  Oncology  Procedures:  Bone marrow biopsy  Antimicrobials:      Subjective: Patient is out of bed to the chair, reports abdominal fullness, no pain, nausea or vomiting, no chest pain. More awake and alert.   Objective: Vitals:   09/26/17 1922 09/26/17 2346  09/27/17 0405 09/27/17 0734  BP: (!) 151/119 (!) 150/67 (!) 146/79 140/76  Pulse: 80 83 80 79  Resp: '16 15 15 17  ' Temp: 98.1 F (36.7 C) (!) 97.3 F (36.3 C) (!) 97.2 F (36.2 C) 97.8 F (36.6 C)   TempSrc: Oral Oral Oral Oral  SpO2: 99% 99% 100%   Weight:   93.8 kg (206 lb 11.2 oz)   Height:        Intake/Output Summary (Last 24 hours) at 09/27/2017 0907 Last data filed at 09/27/2017 0700 Gross per 24 hour  Intake 480 ml  Output 902 ml  Net -422 ml   Filed Weights   09/25/17 0520 09/26/17 0346 09/27/17 0405  Weight: 92.3 kg (203 lb 7.8 oz) 92.3 kg (203 lb 7.8 oz) 93.8 kg (206 lb 11.2 oz)    Examination:   General: Not in pain or dyspnea, deconditioned  Neurology: Awake and alert, non focal  E ENT: mild pallor, no icterus, oral mucosa moist Cardiovascular: No JVD. S1-S2 present, rhythmic, no gallops, rubs, or murmurs. ++ non pitting upper and lower extremity edema. Pulmonary: posistive breath sounds bilaterally, adequate air movement, no wheezing, rhonchi or rales. Gastrointestinal. Abdomen distended, dull to percusstion, no organomegaly, non tender, no rebound or guarding Skin. No rashes Musculoskeletal: no joint deformities     Data Reviewed: I have personally reviewed following labs and imaging studies  CBC: Recent Labs  Lab 09/23/17 0410 09/24/17 0554 09/25/17 0310 09/26/17 0331 09/27/17 0215  WBC 9.7 11.3* 8.2 9.6 11.8*  NEUTROABS 6.4 9.0* 6.9 8.6* 10.4*  HGB 9.3* 8.4* 7.7* 7.8* 8.1*  HCT 29.9* 27.6* 23.9* 23.9* 25.1*  MCV 87.9 89.9 83.6 83.6 84.2  PLT 43* 50* 52* 44* 46*   Basic Metabolic Panel: Recent Labs  Lab 09/24/17 1535 09/24/17 2139 09/25/17 0310 09/26/17 0331 09/27/17 0215  NA 135 133* 134* 139 138  K 6.8* 5.1 5.2* 3.5 3.0*  CL 103 100 101 101 96*  CO2 14* 18* '23 24 25  ' GLUCOSE 127* 168* 201* 227* 259*  BUN 44* 43* 45* 34* 29*  CREATININE 2.16* 2.04* 1.78* 1.17* 1.03*  CALCIUM 8.5* 8.3* 8.1* 8.3* 8.6*   GFR: Estimated Creatinine Clearance: 63.5 mL/min (A) (by C-G formula based on SCr of 1.03 mg/dL (H)). Liver Function Tests: Recent Labs  Lab 09/23/17 0410 09/24/17 0554 09/25/17 0310 09/26/17 0331 09/27/17 0215  AST 838*  1,235* 1,215* 685* 370*  ALT 562* 712* 766* 583* 455*  ALKPHOS 82 86 78 81 96  BILITOT 1.5* 2.5* 1.6* 1.8* 1.5*  PROT 10.3* 10.3* 9.3* 9.4* 9.5*  ALBUMIN 2.2* 2.1* 1.8* 1.8* 1.9*   No results for input(s): LIPASE, AMYLASE in the last 168 hours. Recent Labs  Lab 09/24/17 1006 09/26/17 0331  AMMONIA 56* 53*   Coagulation Profile: Recent Labs  Lab 09/23/17 0410 09/24/17 0554 09/25/17 0310 09/26/17 0331 09/27/17 0215  INR 2.37 4.34* 3.45 2.62 2.12   Cardiac Enzymes: No results for input(s): CKTOTAL, CKMB, CKMBINDEX, TROPONINI in the last 168 hours. BNP (last 3 results) No results for input(s): PROBNP in the last 8760 hours. HbA1C: No results for input(s): HGBA1C in the last 72 hours. CBG: Recent Labs  Lab 09/26/17 0758 09/26/17 1214 09/26/17 1657 09/26/17 1929 09/27/17 0633  GLUCAP 212* 240* 272* 317* 272*   Lipid Profile: No results for input(s): CHOL, HDL, LDLCALC, TRIG, CHOLHDL, LDLDIRECT in the last 72 hours. Thyroid Function Tests: No results for input(s): TSH, T4TOTAL, FREET4, T3FREE, THYROIDAB in the last 72 hours.  Anemia Panel: No results for input(s): VITAMINB12, FOLATE, FERRITIN, TIBC, IRON, RETICCTPCT in the last 72 hours.    Radiology Studies: I have reviewed all of the imaging during this hospital visit personally     Scheduled Meds: . dexamethasone  40 mg Oral Q7 days  . feeding supplement (ENSURE ENLIVE)  237 mL Oral BID BM  . metoprolol tartrate  12.5 mg Oral BID  . polyethylene glycol  17 g Oral Daily  . potassium chloride  40 mEq Oral Once   Continuous Infusions: . sodium chloride 50 mL/hr at 09/26/17 1900     LOS: 8 days        Mauricio Gerome Apley, MD [facility] Pager [phone]

## 2017-09-28 ENCOUNTER — Inpatient Hospital Stay (HOSPITAL_COMMUNITY): Payer: Self-pay

## 2017-09-28 ENCOUNTER — Telehealth: Payer: Self-pay | Admitting: Medical Oncology

## 2017-09-28 DIAGNOSIS — R160 Hepatomegaly, not elsewhere classified: Secondary | ICD-10-CM

## 2017-09-28 DIAGNOSIS — C9000 Multiple myeloma not having achieved remission: Principal | ICD-10-CM

## 2017-09-28 LAB — POTASSIUM: POTASSIUM: 3.8 mmol/L (ref 3.5–5.1)

## 2017-09-28 LAB — BASIC METABOLIC PANEL
ANION GAP: 12 (ref 5–15)
BUN: 32 mg/dL — ABNORMAL HIGH (ref 8–23)
CALCIUM: 8.3 mg/dL — AB (ref 8.9–10.3)
CO2: 27 mmol/L (ref 22–32)
CREATININE: 1.16 mg/dL — AB (ref 0.44–1.00)
Chloride: 96 mmol/L — ABNORMAL LOW (ref 98–111)
GFR, EST AFRICAN AMERICAN: 55 mL/min — AB (ref >60)
GFR, EST NON AFRICAN AMERICAN: 48 mL/min — AB (ref >60)
Glucose, Bld: 261 mg/dL — ABNORMAL HIGH (ref 70–99)
Potassium: 4.9 mmol/L (ref 3.5–5.1)
Sodium: 135 mmol/L (ref 135–145)

## 2017-09-28 LAB — GLUCOSE, CAPILLARY
GLUCOSE-CAPILLARY: 214 mg/dL — AB (ref 70–99)
Glucose-Capillary: 237 mg/dL — ABNORMAL HIGH (ref 70–99)
Glucose-Capillary: 231 mg/dL — ABNORMAL HIGH (ref 70–99)

## 2017-09-28 MED ORDER — DEXAMETHASONE 4 MG PO TABS: 40.0000 mg | ORAL_TABLET | ORAL | 0 refills | Status: DC

## 2017-09-28 MED ORDER — BLOOD GLUCOSE METER KIT: PACK | 0 refills | Status: AC

## 2017-09-28 MED ORDER — ENSURE ENLIVE PO LIQD: 237.0000 mL | Freq: Two times a day (BID) | ORAL | 0 refills | Status: AC

## 2017-09-28 MED ORDER — METOPROLOL TARTRATE 25 MG PO TABS: 12.5000 mg | ORAL_TABLET | Freq: Two times a day (BID) | ORAL | 0 refills | Status: DC

## 2017-09-28 MED ORDER — POLYETHYLENE GLYCOL 3350 17 G PO PACK: 17.0000 g | PACK | Freq: Every day | ORAL | 0 refills | Status: DC

## 2017-09-28 MED ORDER — BLOOD GLUCOSE METER KIT: PACK | 0 refills | Status: DC

## 2017-09-28 NOTE — Progress Notes (Signed)
Occupational Therapy Treatment Patient Details Name: Debbie Chan MRN: [id number] DOB: [birth year] Today's Date: 09/28/2017    History of present illness 67 year old female who presented with dyspnea and chest pain.  With no significant past medical history, complaint of left lower rib cage chest pain, and dyspnea for the last 3 weeks.  She was diagnosed as an outpatient with a pleural effusion.  Dx:  multiple Myeloma    OT comments  Pt progressing towards established OT goals. Pt performing functional mobility in hallway and simulated toilet transfer with Min Guard A -Min A for balance and safety. Pt presenting with decreased activity tolerance and fatigues quickly. Pt following simple commands. Pt with tendency to close her eyes during session and benefits from VCs to maintain eyes open. Continue to recommend dc home with HHOT and will continue to follow acutely as admitted.    Follow Up Recommendations  Home health OT;Supervision/Assistance - 24 hour    Equipment Recommendations  3 in 1 bedside commode;Tub/shower bench    Recommendations for Other Services      Precautions / Restrictions Precautions Precautions: Fall       Mobility Bed Mobility               General bed mobility comments: At EOB upon arrival  Transfers Overall transfer level: Needs assistance Equipment used: Rolling walker (2 wheeled) Transfers: Sit to/from Stand Sit to Stand: Min guard         General transfer comment: Min Guard A for safety    Balance Overall balance assessment: Needs assistance Sitting-balance support: Feet supported Sitting balance-Leahy Scale: Fair     Standing balance support: No upper extremity supported;During functional activity Standing balance-Leahy Scale: Fair Standing balance comment: Able to maintain standing without UE support. Benefits from RW during functional mobility                           ADL either performed or assessed with clinical  judgement   ADL Overall ADL's : Needs assistance/impaired                         Toilet Transfer: Min guard;RW;Ambulation;Minimal assistance(Simulated to recliner) [employer] forces technical [employer] Details (indicate cue type and reason): Pt performing funcitonal mobility in hallway with Min Guard-Min A. Pt requiring increased assistance as she fatigued. Noting that as pt fatgiued her shoulder flexed forward and she started "shuffling" her feet.          Functional mobility during ADLs: Min guard;Rolling walker;Minimal assistance General ADL Comments: Pt performing hallway level funcitonal mobility with Min Guard-Min A for safety and balance. Pt presenting with decreased activity tolerance and fatigues quickly. SpO2 in 90s-100 on RA.      Vision       Perception     Praxis      Cognition Arousal/Alertness: Lethargic Behavior During Therapy: Flat affect Overall Cognitive Status: Impaired/Different from baseline                                 General Comments: NO family present. Pt with flat affect and tendency to close her eyes. Pt following simple commands and maintaining conversation (with her understanding of English). Pt motivated to participate in therapy.         Exercises     Shoulder Instructions       General Comments VSS    Pertinent  Vitals/ Pain       Pain Assessment: Faces Faces Pain Scale: Hurts a little bit Pain Location: generalized Pain Descriptors / Indicators: Grimacing Pain Intervention(s): Monitored during session;Limited activity within patient's tolerance;Repositioned  Home Living                                          Prior Functioning/Environment              Frequency  Min 2X/week        Progress Toward Goals  OT Goals(current goals can now be found in the care plan section)  Progress towards OT goals: Progressing toward goals  Acute Rehab OT Goals Patient Stated Goal: non stated OT Goal  Formulation: Patient unable to participate in goal setting Time For Goal Achievement: 10/09/17 Potential to Achieve Goals: Good ADL Goals Pt Will Perform Grooming: with supervision;standing Pt Will Perform Upper Body Bathing: with supervision;sitting Pt Will Perform Lower Body Bathing: with supervision;sit to/from stand Pt Will Perform Upper Body Dressing: with supervision;sitting Pt Will Perform Lower Body Dressing: with supervision;sit to/from stand Pt Will Transfer to Toilet: with supervision;ambulating;grab bars Pt Will Perform Toileting - Clothing Manipulation and hygiene: with supervision;sit to/from stand Pt Will Perform Tub/Shower Transfer: Tub transfer;with min guard assist;ambulating;tub bench  Plan Discharge plan remains appropriate    Co-evaluation                 AM-PAC PT "6 Clicks" Daily Activity     Outcome Measure   Help from another person eating meals?: None Help from another person taking care of personal grooming?: A Little Help from another person toileting, which includes using toliet, bedpan, or urinal?: A Little Help from another person bathing (including washing, rinsing, drying)?: A Little Help from another person to put on and taking off regular upper body clothing?: A Little Help from another person to put on and taking off regular lower body clothing?: A Little 6 Click Score: 19    End of Session Equipment Utilized During Treatment: Gait belt;Rolling walker  OT Visit Diagnosis: Unsteadiness on feet (R26.81)   Activity Tolerance Patient tolerated treatment well;Patient limited by lethargy   Patient Left in chair;with call bell/phone within reach   Nurse Communication Mobility status        Time: 3557-3220 OT Time Calculation (min): 16 min  Charges: OT General Charges $OT Visit: 1 Visit OT Treatments $Self Care/Home Management : 8-22 mins  Adira Limburg MSOT, OTR/L Acute Rehab Pager: [phone] Office:  [place] 09/28/2017, 9:59 AM

## 2017-09-28 NOTE — Telephone Encounter (Signed)
Discharged today . When does Debbie Chan want to see pt. ? Message sent to [facility] and nurse will send scedule message.

## 2017-09-28 NOTE — Progress Notes (Signed)
Physical Therapy Treatment Patient Details Name: Debbie Chan MRN: [id number] DOB: January 11, 1951 Today's Date: 09/28/2017    History of Present Illness 67 year old female who presented with dyspnea and chest pain.  With no significant past medical history, complaint of left lower rib cage chest pain, and dyspnea for the last 3 weeks.  She was diagnosed as an outpatient with a pleural effusion.  Dx:  multiple Myeloma     PT Comments    Arrived to patient in semi-reclined position in bed, pt motivated for therapy so able to go home. Pt able to ambulate 253f with min assist to steady and for balance and able to ascend/descend 5 stairs with rail on both side as at home. There was some trouble communicating during treatment, tough to distinguish whether due to language barrier or cognition. Pt states English is preferred language, and responded to questions with delay or repetition. Pt heart rate 70-90 form rest to activity, SpO2 100% after activity.      Follow Up Recommendations  Home health PT;Supervision/Assistance - 24 hour     Equipment Recommendations  Rolling walker with 5" wheels;3in1 (PT)    Recommendations for Other Services       Precautions / Restrictions Precautions Precautions: Fall    Mobility  Bed Mobility Overal bed mobility: Needs Assistance Bed Mobility: Supine to Sit     Supine to sit: Supervision     General bed mobility comments: supervision required for safety and balance   Transfers Overall transfer level: Needs assistance Equipment used: Rolling walker (2 wheeled) Transfers: Sit to/from Stand Sit to Stand: Supervision         General transfer comment: supervision for safety and balance   Ambulation/Gait Ambulation/Gait assistance: Min assist Gait Distance (Feet): 250 Feet Assistive device: Rolling walker (2 wheeled);None Gait Pattern/deviations: Step-to pattern;Decreased stride length;Narrow base of support Gait velocity: decreased Gait velocity  interpretation: <1.31 ft/sec, indicative of household ambulator General Gait Details: Min a to stabilize, chair follow for safety. Pt appeared unsteady throughout gait, denied any fatigue. Pt required verbal cues for positioning within the walker and posture to maintain upright, tendency to walk with flexed trunk.     Stairs Stairs: Yes Stairs assistance: Min assist;+2 safety/equipment Stair Management: Two rails;Step to pattern;Forwards Number of Stairs: 5 General stair comments: 2 for safety, unsteady with turn to come back down    Wheelchair Mobility    Modified Rankin (Stroke Patients Only)       Balance Overall balance assessment: Needs assistance Sitting-balance support: No upper extremity supported Sitting balance-Leahy Scale: Fair Sitting balance - Comments: able to maintain static sitting with min guard assist.     Standing balance support: No upper extremity supported Standing balance-Leahy Scale: Fair Standing balance comment: Able to maintain standing without UE support. Benefits from RW during functional mobility                            Cognition Arousal/Alertness: Awake/alert Behavior During Therapy: Flat affect Overall Cognitive Status: Impaired/Different from baseline Area of Impairment: Following commands;Safety/judgement;Awareness;Attention                         Safety/Judgement: Decreased awareness of safety Awareness: Intellectual   General Comments: Pt had flat affect during treatment, but motivated to progress therapy to go home. Pt able to follow most simple commands, but trouble answering some questions or sustaining attention during conversation, required verbals cues to redirecet attention  to conversation.      Exercises      General Comments        Pertinent Vitals/Pain Faces Pain Scale: No hurt Pain Intervention(s): Monitored during session    Home Living                      Prior Function             PT Goals (current goals can now be found in the care plan section) Progress towards PT goals: Progressing toward goals    Frequency    Min 3X/week      PT Plan Current plan remains appropriate    Co-evaluation              AM-PAC PT "6 Clicks" Daily Activity  Outcome Measure  Difficulty turning over in bed (including adjusting bedclothes, sheets and blankets)?: None Difficulty moving from lying on back to sitting on the side of the bed? : A Little Difficulty sitting down on and standing up from a chair with arms (e.g., wheelchair, bedside commode, etc,.)?: A Little Help needed moving to and from a bed to chair (including a wheelchair)?: A Little Help needed walking in hospital room?: A Little Help needed climbing 3-5 steps with a railing? : A Lot 6 Click Score: 18    End of Session Equipment Utilized During Treatment: Gait belt Activity Tolerance: Patient tolerated treatment well Patient left: in chair;with chair alarm set;with call bell/phone within reach   PT Visit Diagnosis: Unsteadiness on feet (R26.81);Muscle weakness (generalized) (M62.81);Other abnormalities of gait and mobility (R26.89)     Time: 9068-9340 PT Time Calculation (min) (ACUTE ONLY): 19 min  Charges:  $Gait Training: 8-22 mins                     Samuella Bruin, SPT  Acute Rehab [phone].    Samuella Bruin 09/28/2017, 2:04 PM

## 2017-09-28 NOTE — Progress Notes (Addendum)
Inpatient Diabetes Program Recommendations  AACE/ADA: New Consensus Statement on Inpatient Glycemic Control (2015)  Target Ranges:  Prepandial:   less than 140 mg/dL      Peak postprandial:   less than 180 mg/dL (1-2 hours)      Critically ill patients:  140 - 180 mg/dL   Lab Results  Component Value Date   GLUCAP 237 (H) 09/28/2017    Review of Glycemic Control Results for Debbie Chan, Debbie Chan (MRN [id number]) as of 09/28/2017 11:16  Ref. Range 09/27/2017 11:57 09/27/2017 16:41 09/27/2017 21:21 09/28/2017 07:51  Glucose-Capillary Latest Ref Range: 70 - 99 mg/dL 270 (H) 307 (H) 339 (H) 237 (H)     Attempted to speak with patient regarding inpatient BS following Decadron administration and pending discharge.  Briefly reviewed that Dr Cathlean Sauer will be ordering glucose meter at discharge. Glucose meter kit ([id number]). This can also be purchased at [employer] ([employer]). Noted that patient will follow up with church clinical doctor until PCP can be established. Encouraged patient to check 2x per day and informed patient of effect of steroids to BS. Patient not expressing understanding. Will reach out to contact person for additional education.   Spoke with RN regarding patient's baseline, verified that communication with patient has been difficult.   Attempted to reach daughter via phone. Left message. Will attempt again later this afternoon.   Addendum_0 : Spoke with patient's daughter regarding BS. Daughter is a Marine scientist and expresses knowledge of how to check BS.  Encouraged to begin checking BS 2 times per day. Informed that she could purchase meter, strips, lancets, and other supplies through [employer]. Strongly encouraged to follow up with PCP for management. Educated daughter of impact to BS s/p Decadron and to anticipate BS to be increased following the weekly oral dose.  Explained what a A1c is and what it measures. Also reviewed goal A1c with patient, importance of good glucose control @ home, and blood  sugar goals. Reviewed survival skills and interventions for hypo vs. Hyper glycemia. Daughter verbalized understanding and has no further questions.   Thanks, Bronson Curb, MSN, RNC-OB Diabetes Coordinator [phone] (8a-5p)

## 2017-09-28 NOTE — Progress Notes (Signed)
Patient discharged home with son-in-law, paperwork and discharge instructions reviewed and all questions answered. IV removed and patient wheeled to vehicle.

## 2017-09-28 NOTE — Care Management Note (Addendum)
Case Management Note  Patient Details  Name: Debbie Chan MRN: [id number] Date of Birth: Mar 12, 1950  Subjective/Objective:   Pt admitted with CP - now determined to be CA                 Action/Plan:  PTA independent from [place] - pt is visiting son and daughter in law living in [place].  Pt has been in the [place] since May 2019.  Pt does not have [place] citizen ship and is not covered by insurance - daughter in law to arrange for her church clinic doctor to preside as PCP until another one can be established.  Oncologist at bedside during assessment- plan is as follows ; pt will discharge home to care of family and will have follow up arranged by Dr Mora Appl at [facility].  Per oncologist - pt will receive all necessary medications and treatments under internal CA charity program.     Expected Discharge Date:  09/28/17               Expected Discharge Plan:  Home/Self Care  In-House Referral:     Discharge planning Services  CM Consult  Post Acute Care Choice:    Choice offered to:  Patient  DME Arranged:  3-N-1, Walker rolling DME Agency:     HH Arranged:  PT [place]:  [place]  Status of Service:  In process, will continue to follow  If discussed at Long Length of Stay Meetings, dates discussed:    Additional Comments: 09/28/2017  Pt to discharge home today in care of family - family will provide 24 hour supervision.  [facility] confirmed acceptance of HHPT as charity (pt family declined [facility]) and equipment has already been delivered to room.  CM provided coupons for discharging medications to [facility] due to less expensive copays - MATCH declined by family.  Family will purchase diabetic supplies at [facility]  09/25/17 Pt recommended for [facility] PT/OT - [facility] will accept pt as charity but can only provide HHPT due to resource constraint.  Family in agreement with PT - family also understanding that OT can not be offered and have commited to working with pt on ADLs to ensure pt remains safe  in the home  ( Daughter-in-Law is a Therapist, sports in the home and will be helping to take car of the pt).  [facility] will accept DME referrals via charity with the exeption of tub bench.  Pt will discharge home with family, family to follow up with [place].  Family/Friends of family and church memebers will ensure 24 hour supervision.  Pt may require MATCH at discharge.  CM will continue to follow for discharge needs  10/24/17 CM informed by bedside nurse that pt will be ambulated on regular basis to ensure pt is optimized prior to discharge with mobility. Maryclare Labrador, RN 09/28/2017, 10:11 AM

## 2017-09-28 NOTE — Discharge Summary (Signed)
Physician Discharge Summary  Debbie Chan ZOX:[id number] DOB: 02-17-1951 DOA: 09/19/2017  PCP: [name], MD  Admit date: 09/19/2017 Discharge date: 09/28/2017  Admitted From: Home  Disposition:  Home   Recommendations for Outpatient Follow-up and new medication changes:  1. Follow up with Dr. Jeanie Cooks in 7 days 2. Follow with Dr Julien Nordmann as scheduled  3. Patient has been placed metoprolol for rate control atrial flutter, not on anticoagulation due to coagulopathy 4. Continue 40 mg dexamethasone weekly  5. Follow renal panel and liver panel in 7 days.   Home Health: Yes  Equipment/Devices: no    Discharge Condition: stable  CODE STATUS: full  Diet recommendation: Regular   Brief/Interim Summary: 67 year old female who presented with dyspnea and chest pain. She did not have any significant past medical history. Reported of left lower rib cage chest pain, and dyspnea for the last 3 weeks. She was diagnosed as an outpatient with a pleural effusion. Denies any fever or chills. On initial physical examination blood pressure 135/99, heart rate 138, temperature 97.5, respirate 26, oxygen saturation 100%. Moist mucous membranes, lungs clear to auscultation bilaterally, heart S1-S2 present rhythmic, tachycardic, abdomen soft nontender, no lower extremity edema.Sodium 136, potassium 4.4, chloride 101, bicarb 22, glucose 109, BUN 15, creatinine 1.45,calcium 9.2, anion gap 13, magnesium 2.1, AST 65, ALT 42 LDH 476, white count 8.1, hemoglobin 10.3, hematocrit 32.4, platelets 80.Urine analysis specific gravity 1.011, 30 protein, 5 white cells, 11-20 RBCs. CT chest with multiple lytic lesions throughout the visualized bones. 3.5 x 7 cm soft tissue mass involving the left seventh rib,moderate right pleural effusion. Hypodense areas along the liver which may represent subcapsular fluid/masses versus hepatic lesions. No pulmonary embolism. Chest x-ray with right pleural effusion. EKG with fine  atrial flutter.  Patient was admitted to the hospital working diagnosis of atrial flutter with rapid ventricular response.  1. Newly diagnosed multiple myeloma/plasma cell myeloma complicated by hypercalcemia, acute kidney injury, anemia, thrombocytopenia and coagulopathy.  Patient was admitted to the stepdown unit, she received intravenous isotonic fluids, and calcitonin.  Further work-up with bone marrow biopsy showed plasma cell myeloma. Hypercellular bone marrow with increased monoclonal plasma cells (39% aspirate, 60% CD138).  Dr. Earlie Server from oncology was consulted, and patient received first dose 40 mg of dexamethasone on August 1.  She will be discharged home, with instructions to follow-up with Dr. Earlie Server for further therapy.  Recommend to check BMP within the next 7 days.  Her discharge calcium is 8.3 with a serum albumin of 1.9.  Discharge hemoglobin 8.1, hematocrit 25.1, platelets 46, INR 2.12.   2.  Atrial flutter.  Patient had telemetry monitoring, she was placed initially on amiodarone, that was discontinued due to elevated liver enzymes.  Metoprolol 12.5 g twice daily was ordered with good toleration.  She converted to sinus rhythm.  No anticoagulation due to baseline coagulopathy.  Further echocardiography showed normal LV systolic function.  3.  Acute kidney injury due to multiple myeloma with prerenal component complicated by hyperkalemia.  Patient received IV fluids and high-dose steroids with improvement of her kidney function.  Discharge creatinine is 1.16, serum bicarbonate 27.  Peak creatinine 2.47. Recommend to avoid nephrotoxic agents and follow-up kidney function within 7 days.  4.  Acute liver failure.  Significant elevation of liver enzymes and coagulopathy along with encephalopathy.  She received supportive medical therapy, her mentation as well as coagulopathy and liver profile has improved.  Abdominal ultrasonography with hepatic cysts, parenchymal echogenicity within  normal limits. No ascites.  Doppler imaging with normal directional blood flow towards the liver. Suspect liver congestion as a culprit of liver dysfunction.  Will need follow-up LFTs as an outpatient.  Discharge AST 370, ALT 455, total bilirubin's 1.5, alkaline phosphatase 96.   5.  Hypertension.  Blood pressure systolic at discharge 569 to 160 mmHg, continue metoprolol 12.5 mg twice daily, follow-up as an outpatient with Dr Jeanie Cooks.   6. Hypoglycemia/ steroid induced hyperglycemia. While acutely ill she became hypoglycemic, required IV dextrose.  Posteriorly it resolved and dextrose was discontinued, over the last 48 hours her fasting serum glucose has been 261 and 259.  Will prescribe a glucometer for home capillary glucose monitoring.  Discharge Diagnoses:  Principal Problem:   Chest pain Active Problems:   Tachycardia   Lytic bone lesions on xray   Anemia   AKI (acute kidney injury) (HCC)   Pleural effusion on right   Hepatomegaly   Atrial flutter (HCC)   Multiple myeloma without remission ([place])    Discharge Instructions   Allergies as of 09/28/2017   No Known Allergies     Medication List    STOP taking these medications   acetaminophen 500 MG tablet Commonly known as:  TYLENOL     TAKE these medications   blood glucose meter kit and supplies Dispense based on patient and insurance preference. Use up to four times daily as directed. (FOR ICD-10 E10.9, E11.9).   dexamethasone 4 MG tablet Commonly known as:  DECADRON Take 10 tablets (40 mg total) by mouth every 7 (seven) days for 3 doses. Start taking on:  10/01/2017   feeding supplement (ENSURE ENLIVE) Liqd Take 237 mLs by mouth 2 (two) times daily between meals.   metoprolol tartrate 25 MG tablet Commonly known as:  LOPRESSOR Take 0.5 tablets (12.5 mg total) by mouth 2 (two) times daily.   polyethylene glycol packet Commonly known as:  MIRALAX / GLYCOLAX Take 17 g by mouth daily.            Durable Medical  Equipment  (From admission, onward)        Start     Ordered   09/25/17 1429  For home use only DME Walker rolling  Once    Question:  Patient needs a walker to treat with the following condition  Answer:  Mobility impaired   09/25/17 1429   09/25/17 1429  For home use only DME 3 n 1  Once     09/25/17 1429     Follow-up Information    [facility] Call.   Why:  Please call and request post hospital discharge follow up appt Contact information: [address] [place] [address] [phone]         No Known Allergies  Consultations:  Oncology   Cardiology    Procedures/Studies: Dg Chest 1 View  Result Date: 09/23/2017 CLINICAL DATA:  Dyspnea. EXAM: CHEST  1 VIEW COMPARISON:  Chest CT 09/19/2017, chest radiograph 09/19/2017 FINDINGS: The cardiac silhouette is enlarged. Calcific atherosclerotic disease of the aorta. No evidence of pneumothorax. Layering right pleural effusion. Right lower and middle lobe peribronchial airspace disease versus atelectasis. Possible small left pleural effusion versus pleural thickening. Osseous structures are without acute abnormality. Soft tissues are grossly normal. IMPRESSION: Layering right pleural effusion with right lower and middle lobe peribronchial airspace disease versus atelectasis. Enlarged cardiac silhouette. Possible small left pleural effusion versus left pleural thickening. Electronically Signed   By: Linwood Dibbles.D.  On: 09/23/2017 13:24   Dg Chest 2 View  Result Date: 09/28/2017 CLINICAL DATA:  67 y/o  F; ascites. EXAM: CHEST - 2 VIEW COMPARISON:  09/23/2017 chest radiograph. FINDINGS: Stable cardiomegaly given projection and technique. Pulmonary venous hypertension. Increased small right and moderate left pleural effusions with associated basilar atelectasis. No acute osseous abnormality is evident. IMPRESSION: Increased small right and moderate left pleural effusions  with associated basilar atelectasis. Pulmonary venous hypertension and cardiomegaly. Electronically Signed   By: Kristine Garbe M.D.   On: 09/28/2017 04:34   Dg Chest 2 View  Result Date: 09/16/2017 CLINICAL DATA:  Anterior chest wall pain for 2 weeks with a nonproductive cough. EXAM: CHEST - 2 VIEW COMPARISON:  None. FINDINGS: The cardiac silhouette is partially obscured though appears borderline enlarged. There is a moderate-sized right pleural effusion with associated compressive atelectasis. Minimal left basilar opacity likely represents atelectasis. The upper lungs are clear bilaterally. No pneumothorax is identified. No acute osseous abnormality is seen. IMPRESSION: Moderate right pleural effusion with right greater than left basilar atelectasis. Electronically Signed   By: Logan Bores M.D.   On: 09/16/2017 15:47   Ct Angio Chest Pe W And/or Wo Contrast  Result Date: 09/19/2017 CLINICAL DATA:  67 year old female with acute chest pain and shortness of breath for 3 weeks. EXAM: CT ANGIOGRAPHY CHEST WITH CONTRAST TECHNIQUE: Multidetector CT imaging of the chest was performed using the standard protocol during bolus administration of intravenous contrast. Multiplanar CT image reconstructions and MIPs were obtained to evaluate the vascular anatomy. CONTRAST:  27m ISOVUE-370 IOPAMIDOL (ISOVUE-370) INJECTION 76% COMPARISON:  09/19/2017 chest radiograph FINDINGS: Cardiovascular: This is a technically satisfactory study. No pulmonary emboli are identified. Moderate to marked cardiomegaly noted with RIGHT atrial enlargement. Coronary artery and aortic atherosclerotic calcifications identified. No pericardial effusion or thoracic aortic aneurysm. Mediastinum/Nodes: No enlarged mediastinal, hilar, or axillary lymph nodes. Thyroid gland, trachea, and esophagus demonstrate no significant findings. Lungs/Pleura: A moderate RIGHT pleural effusion with RIGHT LOWER lung atelectasis noted. This effusion may  be loculated along the RIGHT lung base. No pulmonary nodule, mass or consolidation.  No pneumothorax. Upper Abdomen: Hypodense areas along the liver may represent subcapsular fluid/masses versus parenchymal abnormalities. A small amount of fluid adjacent to the spleen is identified. Musculoskeletal: Multiple lytic lesions of the visualized bones identified involving the spine, ribs, clavicles and sternum. A 3.5 x 7 cm soft tissue destructive mass involving the anterolateral LEFT 7th rib is noted. Review of the MIP images confirms the above findings. IMPRESSION: 1. Multiple lytic lesions throughout the visualized bones with 3.5 x 7 cm soft tissue mass involving the LEFT 7th rib, worrisome for metastatic disease or myeloma. 2. Moderate RIGHT pleural effusion and RIGHT LOWER lung atelectasis. This effusion may be loculated along the RIGHT lung base. Malignant effusion not excluded given bony findings. 3. Hypodense areas along the liver which may represent subcapsular fluid/masses versus hepatic lesions. Consider dedicated abdominal and pelvic CT with contrast for further evaluation. 4. No evidence of pulmonary emboli 5. Moderate to marked cardiomegaly 6. Coronary artery and Aortic Atherosclerosis (ICD10-I70.0). Electronically Signed   By: JMargarette CanadaM.D.   On: 09/19/2017 14:38   UKoreaAbdomen Complete  Result Date: 09/21/2017 CLINICAL DATA:  67year old female with a history of acute renal failure. Recent bone survey demonstrates evidence of metastatic disease or myeloma EXAM: ABDOMEN ULTRASOUND COMPLETE COMPARISON:  CT chest 09/19/2017, bone survey 09/19/2017 FINDINGS: Gallbladder: Circumferential gallbladder wall thickening, with no hyperechoic material within the gallbladder lumen. Negative sonographic  Murphy's sign. No pericholecystic fluid. Common bile duct: Diameter: 2 mm-3 mm Liver: Increased echogenicity of the liver. The surface does not appear nodular. 1 cm anechoic cystic structure in the left liver with  through transmission and no internal complexity. Cystic lesion of the right liver, towards the dome of the liver measures 3.9 cm x 3.9 cm x 4.0 cm. Through transmission with no significant internal complexity. No associated flow. IVC: No abnormality visualized. Pancreas: Visualized portion unremarkable. Spleen: Size and appearance within normal limits. Right Kidney: Length: 14.0 cm. No hydronephrosis. Echogenicity somewhat increased. Several hypoechoic lesions of the right kidney involving the cortex. The largest 2 measure 3.6 cm x 2.7 cm x 3.7 cm and 2.7 cm x 2.5 cm x 2.3 cm. No internal flow and there is posterior Q stick enhancement. The more medial demonstrates a poorly defined margin. Left Kidney: Length: 16.3 cm. No hydronephrosis. Echogenicity similar to the right. Anechoic lesion at the inferior left kidney measures 6.5 cm x 4.7 cm x 5.4 cm. Through transmission with no significant internal complexity. Additional lesion on the left measures 4.8 cm x 5.6 cm x 5.5 cm. Abdominal aorta: No aneurysm visualized. Other findings: None. IMPRESSION: No hydronephrosis of the bilateral kidneys. Echogenicity of the bilateral kidneys is somewhat increased, suggesting chronic medical renal disease. Bilateral kidneys demonstrate cystic lesions. While most likely benign, there is a right-sided lesion with a poorly defined wall. Further evaluation with contrast-enhanced CT or MR may be useful. Circumferential gallbladder wall thickening with negative sonographic Murphy's sign. This finding may be seen with chronic liver disease or potentially right-sided heart failure. Correlation with presentation and lab values may be useful. The partially visualized lesions of the liver are not well characterized on the current ultrasound. The lesion at the liver dome appears cystic, however, correlation with contrast-enhanced CT or MR is recommended. Electronically Signed   By: Corrie Mckusick D.O.   On: 09/21/2017 07:08   Dg Chest  Portable 1 View  Result Date: 09/19/2017 CLINICAL DATA:  Chest pain EXAM: PORTABLE CHEST 1 VIEW COMPARISON:  09/16/2017 FINDINGS: Cardiac shadow remains enlarged. Right basilar atelectasis and associated effusion is again identified stable from the prior exam. Left lung is clear. No bony abnormality is noted. IMPRESSION: Right basilar atelectasis with associated effusions stable from the previous exam. Electronically Signed   By: Inez Catalina M.D.   On: 09/19/2017 11:43   Dg Bone Survey Met  Result Date: 09/19/2017 CLINICAL DATA:  Lytic lesions shown on prior chest CT EXAM: METASTATIC BONE SURVEY COMPARISON:  Chest CT performed earlier today FINDINGS: Possible small lytic lesions scattered throughout the calvarium. Lytic lesions noted in the mid to distal left clavicle and possible mid right clavicle. Scattered small lytic lesions throughout the humeri bilaterally and within the proximal to mid left radius and ulna. Small lytic lesion within the mid right radius. No focal lytic lesion or acute bony abnormality within the cervical, thoracic or lumbar spine. Mild degenerative facet disease throughout the lumbar spine. Probable small lytic lesions within the proximal to midshaft of the left femur and possibly within the shafts of the left tibia and fibula. Lytic lesions scattered throughout the right tibia. Heart is borderline in size. Right pleural effusion with right lower lobe atelectasis. Left lung clear. Density projecting over the left lateral lower chest represents the soft tissue mass associated with the left 7th rib anteriorly. IMPRESSION: Numerous small lytic lesions scattered throughout the skull including the calvarium, bilateral clavicles, humeri, radius/ulna bilaterally, left femur, and  bilateral tibia/fibula. Findings compatible with metastatic disease or myeloma. The sternum and rib lesions are better seen on chest CT. Soft tissue density over the left lower chest corresponds to the soft tissue  mass associated with the anterior left 7th rib. Small to moderate right pleural effusion with right base atelectasis. Electronically Signed   By: Rolm Baptise M.D.   On: 09/19/2017 17:13   Ct Bone Marrow Biopsy & Aspiration  Result Date: 09/23/2017 CLINICAL DATA:  Lytic bone lesions, abnormal serum electrophoresis and clinical suspicion of multiple myeloma. Bone marrow biopsy required for further workup. EXAM: CT GUIDED BONE MARROW ASPIRATION AND BIOPSY ANESTHESIA/SEDATION: Versed 1.0 mg IV, Fentanyl 25 mcg IV Total Moderate Sedation Time:   16 minutes. The patient's level of consciousness and physiologic status were continuously monitored during the procedure by Radiology nursing. PROCEDURE: The procedure risks, benefits, and alternatives were explained to the patient. Questions regarding the procedure were encouraged and answered. The patient understands and consents to the procedure. A time out was performed prior to initiating the procedure. The right gluteal region was prepped with chlorhexidine. Sterile gown and sterile gloves were used for the procedure. Local anesthesia was provided with 1% Lidocaine. Under CT guidance, an 11 gauge On Control bone cutting needle was advanced from a posterior approach into the right iliac bone. Needle positioning was confirmed with CT. Initial non heparinized and heparinized aspirate samples were obtained of bone marrow. Core biopsy was performed via the On Control drill needle. COMPLICATIONS: None FINDINGS: Inspection of initial aspirate did reveal visible particles. Intact core biopsy sample was obtained. IMPRESSION: CT guided bone marrow biopsy of right posterior iliac bone with both aspirate and core samples obtained. Electronically Signed   By: Aletta Edouard M.D.   On: 09/23/2017 15:32   US Abdomen Limited Ruq  Result Date: 09/28/2017 CLINICAL DATA:  Assess liver and evaluate for ascites. EXAM: ULTRASOUND ABDOMEN LIMITED RIGHT UPPER QUADRANT COMPARISON:   Abdominal ultrasound performed 09/21/2017 FINDINGS: Gallbladder: Gallbladder wall thickening is noted, measuring up to 6 mm. No stones or sludge are seen. No pericholecystic fluid is appreciated. No ultrasonographic Murphy's sign is identified. Common bile duct: Diameter: 0.4 cm, within normal limits in caliber. Liver: Several cysts are noted at the right hepatic dome. Hepatic cysts measure up to 3.1 cm in size. Within normal limits in parenchymal echogenicity. Portal vein is patent on color Doppler imaging with normal direction of blood flow towards the liver. No definite ascites is noted. IMPRESSION: 1. No acute abnormality seen to explain the patient's symptoms. No ascites seen. 2. Gallbladder wall thickening is nonspecific and may reflect mild chronic inflammation. 3. Hepatic cysts noted. Electronically Signed   By: Garald Balding M.D.   On: 09/28/2017 04:11       Subjective: Patient is feeling better, no nausea or vomiting, tolerating po well, no dyspnea.   Discharge Exam: Vitals:   09/27/17 2325 09/28/17 0743  BP: (!) 160/87 (!) 146/86  Pulse:  75  Resp:  18  Temp: 97.6 F (36.4 C) 97.6 F (36.4 C)  SpO2: 100% 100%   Vitals:   09/27/17 1528 09/27/17 1929 09/27/17 2325 09/28/17 0743  BP: (!) 145/83 (!) 145/84 (!) 160/87 (!) 146/86  Pulse: 70 74  75  Resp: '18 18  18  ' Temp: (!) 97.5 F (36.4 C) 98.4 F (36.9 C) 97.6 F (36.4 C) 97.6 F (36.4 C)  TempSrc: Oral Oral Oral Oral  SpO2: 100% 100% 100% 100%  Weight:  Height:        General: Not in pain or dyspnea  Neurology: Awake and alert, non focal  E ENT: no pallor, no icterus, oral mucosa moist Cardiovascular: No JVD. S1-S2 present, rhythmic, no gallops, rubs, or murmurs.Trace lower extremity edema. Pulmonary: vesicular breath sounds bilaterally, adequate air movement, no wheezing, rhonchi or rales. Mild decreased breath sounds at bases Gastrointestinal. Abdomen with no organomegaly, non tender, no rebound or  guarding Skin. No rashes Musculoskeletal: no joint deformities   The results of significant diagnostics from this hospitalization (including imaging, microbiology, ancillary and laboratory) are listed below for reference.     Microbiology: Recent Results (from the past 240 hour(s))  MRSA PCR Screening     Status: None   Collection Time: 09/19/17  5:33 PM  Result Value Ref Range Status   MRSA by PCR NEGATIVE NEGATIVE Final    Comment:        The GeneXpert MRSA Assay (FDA approved for NASAL specimens only), is one component of a comprehensive MRSA colonization surveillance program. It is not intended to diagnose MRSA infection nor to guide or monitor treatment for MRSA infections. Performed at [facility], [place] [address] [place], [place] [address]      Labs: BNP (last 3 results) Recent Labs    09/19/17 1343  BNP 494.4*   Basic Metabolic Panel: Recent Labs  Lab 09/24/17 2139 09/25/17 0310 09/26/17 0331 09/27/17 0215 09/28/17 0246  NA 133* 134* 139 138 135  K 5.1 5.2* 3.5 3.0* 4.9  CL 100 101 101 96* 96*  CO2 18* '23 24 25 27  ' GLUCOSE 168* 201* 227* 259* 261*  BUN 43* 45* 34* 29* 32*  CREATININE 2.04* 1.78* 1.17* 1.03* 1.16*  CALCIUM 8.3* 8.1* 8.3* 8.6* 8.3*   Liver Function Tests: Recent Labs  Lab 09/23/17 0410 09/24/17 0554 09/25/17 0310 09/26/17 0331 09/27/17 0215  AST 838* 1,235* 1,215* 685* 370*  ALT 562* 712* 766* 583* 455*  ALKPHOS 82 86 78 81 96  BILITOT 1.5* 2.5* 1.6* 1.8* 1.5*  PROT 10.3* 10.3* 9.3* 9.4* 9.5*  ALBUMIN 2.2* 2.1* 1.8* 1.8* 1.9*   No results for input(s): LIPASE, AMYLASE in the last 168 hours. Recent Labs  Lab 09/24/17 1006 09/26/17 0331  AMMONIA 56* 53*   CBC: Recent Labs  Lab 09/23/17 0410 09/24/17 0554 09/25/17 0310 09/26/17 0331 09/27/17 0215  WBC 9.7 11.3* 8.2 9.6 11.8*  NEUTROABS 6.4 9.0* 6.9 8.6* 10.4*  HGB 9.3* 8.4* 7.7* 7.8* 8.1*  HCT 29.9* 27.6* 23.9* 23.9* 25.1*  MCV 87.9 89.9 83.6 83.6 84.2   PLT 43* 50* 52* 44* 46*   Cardiac Enzymes: No results for input(s): CKTOTAL, CKMB, CKMBINDEX, TROPONINI in the last 168 hours. BNP: Invalid input(s): POCBNP CBG: Recent Labs  Lab 09/27/17 0633 09/27/17 1157 09/27/17 1641 09/27/17 2121 09/28/17 0751  GLUCAP 272* 270* 307* 339* 237*   D-Dimer No results for input(s): DDIMER in the last 72 hours. Hgb A1c No results for input(s): HGBA1C in the last 72 hours. Lipid Profile No results for input(s): CHOL, HDL, LDLCALC, TRIG, CHOLHDL, LDLDIRECT in the last 72 hours. Thyroid function studies No results for input(s): TSH, T4TOTAL, T3FREE, THYROIDAB in the last 72 hours.  Invalid input(s): FREET3 Anemia work up No results for input(s): VITAMINB12, FOLATE, FERRITIN, TIBC, IRON, RETICCTPCT in the last 72 hours. Urinalysis    Component Value Date/Time   COLORURINE YELLOW 09/19/2017 1725   APPEARANCEUR CLEAR 09/19/2017 1725   LABSPEC 1.011 09/19/2017 1725  PHURINE 6.0 09/19/2017 1725   GLUCOSEU NEGATIVE 09/19/2017 1725   HGBUR LARGE (A) 09/19/2017 1725   BILIRUBINUR NEGATIVE 09/19/2017 1725   KETONESUR NEGATIVE 09/19/2017 1725   PROTEINUR 30 (A) 09/19/2017 1725   NITRITE NEGATIVE 09/19/2017 1725   LEUKOCYTESUR TRACE (A) 09/19/2017 1725   Sepsis Labs Invalid input(s): PROCALCITONIN,  WBC,  LACTICIDVEN Microbiology Recent Results (from the past 240 hour(s))  MRSA PCR Screening     Status: None   Collection Time: 09/19/17  5:33 PM  Result Value Ref Range Status   MRSA by PCR NEGATIVE NEGATIVE Final    Comment:        The GeneXpert MRSA Assay (FDA approved for NASAL specimens only), is one component of a comprehensive MRSA colonization surveillance program. It is not intended to diagnose MRSA infection nor to guide or monitor treatment for MRSA infections. Performed at [facility], [place] [address], [place], [place] [address]      Time coordinating discharge: 45 minutes  SIGNED:   Tawni Millers,  MD  [facility] 09/28/2017, 8:27 AM Pager [phone]  If 7PM-7AM, please contact night-coverage [email or web address] Password [email or web address]

## 2017-09-30 ENCOUNTER — Telehealth: Payer: Self-pay | Admitting: Medical Oncology

## 2017-09-30 NOTE — Telephone Encounter (Signed)
Dtr asking when pt will see Mohamed.

## 2017-10-01 ENCOUNTER — Telehealth: Payer: Self-pay | Admitting: Medical Oncology

## 2017-10-01 ENCOUNTER — Telehealth: Payer: Self-pay | Admitting: Internal Medicine

## 2017-10-01 NOTE — Telephone Encounter (Signed)
LVM on Fola's phone re pt appt tomorrow.

## 2017-10-01 NOTE — Telephone Encounter (Signed)
Scheduled appt per 8/6 sch message - pt is aware of appt date and time. And per MM okay to add on Wednesday at admin time.

## 2017-10-02 ENCOUNTER — Inpatient Hospital Stay: Payer: Self-pay | Attending: Internal Medicine

## 2017-10-02 ENCOUNTER — Telehealth: Payer: Self-pay | Admitting: Pharmacist

## 2017-10-02 ENCOUNTER — Telehealth: Payer: Self-pay | Admitting: Internal Medicine

## 2017-10-02 ENCOUNTER — Other Ambulatory Visit: Payer: Self-pay | Admitting: *Deleted

## 2017-10-02 ENCOUNTER — Encounter: Payer: Self-pay | Admitting: Internal Medicine

## 2017-10-02 ENCOUNTER — Inpatient Hospital Stay (HOSPITAL_BASED_OUTPATIENT_CLINIC_OR_DEPARTMENT_OTHER): Payer: Self-pay | Admitting: Internal Medicine

## 2017-10-02 VITALS — BP 179/89 | HR 80 | Temp 99.4°F | Resp 18 | Ht 68.0 in | Wt 210.7 lb

## 2017-10-02 DIAGNOSIS — C9000 Multiple myeloma not having achieved remission: Secondary | ICD-10-CM | POA: Insufficient documentation

## 2017-10-02 DIAGNOSIS — I119 Hypertensive heart disease without heart failure: Secondary | ICD-10-CM

## 2017-10-02 DIAGNOSIS — K7689 Other specified diseases of liver: Secondary | ICD-10-CM

## 2017-10-02 DIAGNOSIS — R109 Unspecified abdominal pain: Secondary | ICD-10-CM

## 2017-10-02 DIAGNOSIS — R634 Abnormal weight loss: Secondary | ICD-10-CM | POA: Insufficient documentation

## 2017-10-02 DIAGNOSIS — J90 Pleural effusion, not elsewhere classified: Secondary | ICD-10-CM

## 2017-10-02 DIAGNOSIS — R140 Abdominal distension (gaseous): Secondary | ICD-10-CM

## 2017-10-02 DIAGNOSIS — R5383 Other fatigue: Secondary | ICD-10-CM

## 2017-10-02 DIAGNOSIS — Z79899 Other long term (current) drug therapy: Secondary | ICD-10-CM | POA: Insufficient documentation

## 2017-10-02 DIAGNOSIS — Z7189 Other specified counseling: Secondary | ICD-10-CM

## 2017-10-02 DIAGNOSIS — L03115 Cellulitis of right lower limb: Secondary | ICD-10-CM | POA: Insufficient documentation

## 2017-10-02 DIAGNOSIS — I4892 Unspecified atrial flutter: Secondary | ICD-10-CM | POA: Insufficient documentation

## 2017-10-02 DIAGNOSIS — I872 Venous insufficiency (chronic) (peripheral): Secondary | ICD-10-CM | POA: Insufficient documentation

## 2017-10-02 DIAGNOSIS — I517 Cardiomegaly: Secondary | ICD-10-CM | POA: Insufficient documentation

## 2017-10-02 DIAGNOSIS — I700 Atherosclerosis of aorta: Secondary | ICD-10-CM | POA: Insufficient documentation

## 2017-10-02 DIAGNOSIS — M5136 Other intervertebral disc degeneration, lumbar region: Secondary | ICD-10-CM | POA: Insufficient documentation

## 2017-10-02 DIAGNOSIS — E8809 Other disorders of plasma-protein metabolism, not elsewhere classified: Secondary | ICD-10-CM | POA: Insufficient documentation

## 2017-10-02 DIAGNOSIS — R600 Localized edema: Secondary | ICD-10-CM | POA: Insufficient documentation

## 2017-10-02 DIAGNOSIS — Z7901 Long term (current) use of anticoagulants: Secondary | ICD-10-CM | POA: Insufficient documentation

## 2017-10-02 DIAGNOSIS — L270 Generalized skin eruption due to drugs and medicaments taken internally: Secondary | ICD-10-CM | POA: Insufficient documentation

## 2017-10-02 DIAGNOSIS — Z5111 Encounter for antineoplastic chemotherapy: Secondary | ICD-10-CM | POA: Insufficient documentation

## 2017-10-02 LAB — CBC WITH DIFFERENTIAL (CANCER CENTER ONLY)
Basophils Absolute: 0 10*3/uL (ref 0.0–0.1)
Basophils Relative: 1 %
EOS PCT: 0 %
Eosinophils Absolute: 0 10*3/uL (ref 0.0–0.5)
HEMATOCRIT: 27.2 % — AB (ref 34.8–46.6)
Hemoglobin: 8.8 g/dL — ABNORMAL LOW (ref 11.6–15.9)
LYMPHS PCT: 7 %
Lymphs Abs: 0.6 10*3/uL — ABNORMAL LOW (ref 0.9–3.3)
MCH: 28.5 pg (ref 25.1–34.0)
MCHC: 32.5 g/dL (ref 31.5–36.0)
MCV: 87.7 fL (ref 79.5–101.0)
MONO ABS: 0.7 10*3/uL (ref 0.1–0.9)
MONOS PCT: 7 %
Neutro Abs: 8 10*3/uL — ABNORMAL HIGH (ref 1.5–6.5)
Neutrophils Relative %: 85 %
PLATELETS: 81 10*3/uL — AB (ref 145–400)
RBC: 3.11 MIL/uL — ABNORMAL LOW (ref 3.70–5.45)
RDW: 24.5 % — AB (ref 11.2–14.5)
WBC Count: 9.3 10*3/uL (ref 3.9–10.3)

## 2017-10-02 LAB — CMP (CANCER CENTER ONLY)
ALBUMIN: 2.2 g/dL — AB (ref 3.5–5.0)
ALT: 148 U/L — ABNORMAL HIGH (ref 0–44)
ANION GAP: 14 (ref 5–15)
AST: 76 U/L — AB (ref 15–41)
Alkaline Phosphatase: 104 U/L (ref 38–126)
BUN: 30 mg/dL — AB (ref 8–23)
CO2: 22 mmol/L (ref 22–32)
Calcium: 8.4 mg/dL — ABNORMAL LOW (ref 8.9–10.3)
Chloride: 103 mmol/L (ref 98–111)
Creatinine: 1.32 mg/dL — ABNORMAL HIGH (ref 0.44–1.00)
GFR, Est AFR Am: 47 mL/min — ABNORMAL LOW (ref >60)
GFR, Estimated: 41 mL/min — ABNORMAL LOW (ref >60)
GLUCOSE: 258 mg/dL — AB (ref 70–99)
POTASSIUM: 4.7 mmol/L (ref 3.5–5.1)
SODIUM: 139 mmol/L (ref 135–145)
Total Bilirubin: 0.9 mg/dL (ref 0.3–1.2)
Total Protein: 8.7 g/dL — ABNORMAL HIGH (ref 6.5–8.1)

## 2017-10-02 MED ORDER — PROCHLORPERAZINE MALEATE 10 MG PO TABS: 10.0000 mg | ORAL_TABLET | Freq: Four times a day (QID) | ORAL | 0 refills | Status: DC | PRN

## 2017-10-02 MED ORDER — ACYCLOVIR 200 MG PO CAPS: 200.0000 mg | ORAL_CAPSULE | Freq: Two times a day (BID) | ORAL | 2 refills | Status: DC

## 2017-10-02 MED ORDER — DEXAMETHASONE 4 MG PO TABS: 40.0000 mg | ORAL_TABLET | ORAL | 3 refills | Status: DC

## 2017-10-02 NOTE — Progress Notes (Signed)
START ON PATHWAY REGIMEN - Multiple Myeloma and Other Plasma Cell Dyscrasias     A cycle is every 21 days:     Bortezomib      Lenalidomide      Dexamethasone   **Always confirm dose/schedule in your pharmacy ordering system**  Patient Characteristics: Newly Diagnosed, Transplant Ineligible or Refused, Unknown or Awaiting Test Results R-ISS Staging: III Disease Classification: Newly Diagnosed Is Patient Eligible for Transplant<= Transplant Ineligible or Refused Risk Status: Unknown Intent of Therapy: Non-Curative / Palliative Intent, Discussed with Patient

## 2017-10-02 NOTE — Telephone Encounter (Signed)
Appts scheduled AVS/Calendar printed per 8/9 los

## 2017-10-02 NOTE — Telephone Encounter (Signed)
Oral Chemotherapy Pharmacist Encounter   I spoke with patient and daughter in exam room for overview of: Revlimid.   Counseled patient on administration, dosing, side effects, monitoring, drug-food interactions, safe handling, storage, and disposal.  Patient will take Revlimid 25mg  capsules, 1 capsule by mouth once daily, without regard to food, with a full glass of water.  Revlimid will be given 14 days on, 7 days off, repeat every 21 days.  Patient will take dexamethasone 4mg  tablets, 10 tablets (40mg ) by mouth once weekly with breakfast.  Revlimid start date: TBD, pending medication acquisition  Adverse effects of Revlimid include but are not limited to: nausea, constipation, diarrhea, abdominal pain, rash, fatigue, drug fever, and decreased blood counts.    Reviewed with patient importance of keeping a medication schedule and plan for any missed doses.  Mrs. Stelly voiced understanding and appreciation.   All questions answered. Medication reconciliation performed and medication/allergy list updated.  We discussed indication for acyclovir and VZV prophylaxis. Patient counseled on importance of daily aspirin 81mg  for VTE prophylaxis. Platelet count from today has not yet returned, will wait until platelet count rises above 50k prior to initiation of aspirin. I will alert patient when it is safe to start aspirin 81 mg daily.  Patient is not a [place] citizen and does not have any insurance coverage. Patient does not have any income. She is living with and being supported by family members.  We discussed manufacturer compassionate use program for her medications. We will apply for manufacturer assistance for Revlimid from [employer] on the patient's behalf. This application will be updated in a separate encounter.  Patient knows to call the office with questions or concerns. Oral Oncology Clinic will continue to follow.  Johny Drilling, PharmD, BCPS, BCOP  10/02/2017    2:25 PM Oral  Oncology Clinic [phone]

## 2017-10-02 NOTE — Progress Notes (Signed)
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  PRIOR THERAPY: None  CURRENT THERAPY: Systemic therapy with Velcade 1.3 mg/M2 weekly, Revlimid 25 mg p.o. daily for 14 days every 3 weeks in addition to weekly Decadron 40 mg orally.  First dose of treatment 10/08/2017.  INTERVAL HISTORY: Debbie Chan 67 y.o. female returns to the clinic today for follow-up visit accompanied by her daughter-in-law.  The patient is feeling much better compared to when she was in the hospital.  She continues to have some abdominal pain but no significant nausea or vomiting.  She denied having any constipation.  She has no chest pain, shortness breath, cough or hemoptysis.  She denied having any fever or chills.  She was seen for initial consultation during her hospitalization at [facility] and the skeletal bone survey showed multiple lytic lesions.  The patient also had a bone marrow biopsy and aspirate that showed significant increase in abnormal plasma cells up to 60%.  Her myeloma panel showed elevated IgG over 6400 with elevated free lambda light chain of 1067.8.  The patient was started on high-dose Decadron during her hospitalization and she had improvement of her condition.  She is here today for evaluation and discussion of her treatment options.   MEDICAL HISTORY:No past medical history on file.  ALLERGIES:  has No Known Allergies.  MEDICATIONS:  Current Outpatient Medications  Medication Sig Dispense Refill  . blood glucose meter kit and supplies Dispense based on patient and insurance preference. Use up to four times daily as directed. (FOR ICD-10 E10.9, E11.9). 1 each 0  . dexamethasone (DECADRON) 4 MG tablet Take 10 tablets (40 mg total) by mouth every 7 (seven) days for 3 doses. 30 tablet 0  . feeding supplement,  ENSURE ENLIVE, (ENSURE ENLIVE) LIQD Take 237 mLs by mouth 2 (two) times daily between meals. 14220 mL 0  . metoprolol tartrate (LOPRESSOR) 25 MG tablet Take 0.5 tablets (12.5 mg total) by mouth 2 (two) times daily. 30 tablet 0  . polyethylene glycol (MIRALAX / GLYCOLAX) packet Take 17 g by mouth daily. 14 each 0   No current facility-administered medications for this visit.     SURGICAL HISTORY: No past surgical history on file.  REVIEW OF SYSTEMS:  Constitutional: positive for fatigue and weight loss Eyes: negative Ears, nose, mouth, throat, and face: negative Respiratory: negative Cardiovascular: negative Gastrointestinal: positive for abdominal pain Genitourinary:negative Integument/breast: negative Hematologic/lymphatic: negative Musculoskeletal:negative Neurological: negative Behavioral/Psych: negative Endocrine: negative Allergic/Immunologic: negative   PHYSICAL EXAMINATION: General appearance: alert, cooperative, fatigued and no distress Head: Normocephalic, without obvious abnormality, atraumatic Neck: no adenopathy, no JVD, supple, symmetrical, trachea midline and thyroid not enlarged, symmetric, no tenderness/mass/nodules Lymph nodes: Cervical, supraclavicular, and axillary nodes normal. Resp: clear to auscultation bilaterally Back: symmetric, no curvature. ROM normal. No CVA tenderness. Cardio: regular rate and rhythm, S1, S2 normal, no murmur, click, rub or gallop GI: Abdominal distention Extremities: extremities normal, atraumatic, no cyanosis or edema Neurologic: Alert and oriented X 3, normal strength and tone. Normal symmetric reflexes. Normal coordination and gait  ECOG PERFORMANCE STATUS: 1 - Symptomatic but completely ambulatory  Blood pressure (!) 179/89, pulse 80, temperature 99.4 F (37.4 C), temperature source Oral, resp. rate 18, height '5\' 8"'  (1.727 m), weight 210 lb 11.2 oz (95.6 kg), SpO2 100 %.  LABORATORY DATA: Lab  Results  Component Value Date    WBC 11.8 (H) 09/27/2017   HGB 8.1 (L) 09/27/2017   HCT 25.1 (L) 09/27/2017   MCV 84.2 09/27/2017   PLT 46 (L) 09/27/2017      Chemistry      Component Value Date/Time   NA 139 10/02/2017 0900   K 4.7 10/02/2017 0900   CL 103 10/02/2017 0900   CO2 22 10/02/2017 0900   BUN 30 (H) 10/02/2017 0900   CREATININE 1.32 (H) 10/02/2017 0900      Component Value Date/Time   CALCIUM 8.4 (L) 10/02/2017 0900   ALKPHOS 104 10/02/2017 0900   AST 76 (H) 10/02/2017 0900   ALT 148 (H) 10/02/2017 0900   BILITOT 0.9 10/02/2017 0900       RADIOGRAPHIC STUDIES: Dg Chest 1 View  Result Date: 09/23/2017 CLINICAL DATA:  Dyspnea. EXAM: CHEST  1 VIEW COMPARISON:  Chest CT 09/19/2017, chest radiograph 09/19/2017 FINDINGS: The cardiac silhouette is enlarged. Calcific atherosclerotic disease of the aorta. No evidence of pneumothorax. Layering right pleural effusion. Right lower and middle lobe peribronchial airspace disease versus atelectasis. Possible small left pleural effusion versus pleural thickening. Osseous structures are without acute abnormality. Soft tissues are grossly normal. IMPRESSION: Layering right pleural effusion with right lower and middle lobe peribronchial airspace disease versus atelectasis. Enlarged cardiac silhouette. Possible small left pleural effusion versus left pleural thickening. Electronically Signed   By: Fidela Salisbury M.D.   On: 09/23/2017 13:24   Dg Chest 2 View  Result Date: 09/28/2017 CLINICAL DATA:  67 y/o  F; ascites. EXAM: CHEST - 2 VIEW COMPARISON:  09/23/2017 chest radiograph. FINDINGS: Stable cardiomegaly given projection and technique. Pulmonary venous hypertension. Increased small right and moderate left pleural effusions with associated basilar atelectasis. No acute osseous abnormality is evident. IMPRESSION: Increased small right and moderate left pleural effusions with associated basilar atelectasis. Pulmonary venous hypertension and cardiomegaly. Electronically  Signed   By: Kristine Garbe M.D.   On: 09/28/2017 04:34   Dg Chest 2 View  Result Date: 09/16/2017 CLINICAL DATA:  Anterior chest wall pain for 2 weeks with a nonproductive cough. EXAM: CHEST - 2 VIEW COMPARISON:  None. FINDINGS: The cardiac silhouette is partially obscured though appears borderline enlarged. There is a moderate-sized right pleural effusion with associated compressive atelectasis. Minimal left basilar opacity likely represents atelectasis. The upper lungs are clear bilaterally. No pneumothorax is identified. No acute osseous abnormality is seen. IMPRESSION: Moderate right pleural effusion with right greater than left basilar atelectasis. Electronically Signed   By: Logan Bores M.D.   On: 09/16/2017 15:47   Ct Angio Chest Pe W And/or Wo Contrast  Result Date: 09/19/2017 CLINICAL DATA:  67 year old female with acute chest pain and shortness of breath for 3 weeks. EXAM: CT ANGIOGRAPHY CHEST WITH CONTRAST TECHNIQUE: Multidetector CT imaging of the chest was performed using the standard protocol during bolus administration of intravenous contrast. Multiplanar CT image reconstructions and MIPs were obtained to evaluate the vascular anatomy. CONTRAST:  54m ISOVUE-370 IOPAMIDOL (ISOVUE-370) INJECTION 76% COMPARISON:  09/19/2017 chest radiograph FINDINGS: Cardiovascular: This is a technically satisfactory study. No pulmonary emboli are identified. Moderate to marked cardiomegaly noted with RIGHT atrial enlargement. Coronary artery and aortic atherosclerotic calcifications identified. No pericardial effusion or thoracic aortic aneurysm. Mediastinum/Nodes: No enlarged mediastinal, hilar, or axillary lymph nodes. Thyroid gland, trachea, and esophagus demonstrate no significant findings. Lungs/Pleura: A moderate RIGHT pleural effusion with RIGHT LOWER lung atelectasis noted. This effusion may be loculated along the RIGHT lung  base. No pulmonary nodule, mass or consolidation.  No pneumothorax.  Upper Abdomen: Hypodense areas along the liver may represent subcapsular fluid/masses versus parenchymal abnormalities. A small amount of fluid adjacent to the spleen is identified. Musculoskeletal: Multiple lytic lesions of the visualized bones identified involving the spine, ribs, clavicles and sternum. A 3.5 x 7 cm soft tissue destructive mass involving the anterolateral LEFT 7th rib is noted. Review of the MIP images confirms the above findings. IMPRESSION: 1. Multiple lytic lesions throughout the visualized bones with 3.5 x 7 cm soft tissue mass involving the LEFT 7th rib, worrisome for metastatic disease or myeloma. 2. Moderate RIGHT pleural effusion and RIGHT LOWER lung atelectasis. This effusion may be loculated along the RIGHT lung base. Malignant effusion not excluded given bony findings. 3. Hypodense areas along the liver which may represent subcapsular fluid/masses versus hepatic lesions. Consider dedicated abdominal and pelvic CT with contrast for further evaluation. 4. No evidence of pulmonary emboli 5. Moderate to marked cardiomegaly 6. Coronary artery and Aortic Atherosclerosis (ICD10-I70.0). Electronically Signed   By: Margarette Canada M.D.   On: 09/19/2017 14:38   US Abdomen Complete  Result Date: 09/21/2017 CLINICAL DATA:  67 year old female with a history of acute renal failure. Recent bone survey demonstrates evidence of metastatic disease or myeloma EXAM: ABDOMEN ULTRASOUND COMPLETE COMPARISON:  CT chest 09/19/2017, bone survey 09/19/2017 FINDINGS: Gallbladder: Circumferential gallbladder wall thickening, with no hyperechoic material within the gallbladder lumen. Negative sonographic Murphy's sign. No pericholecystic fluid. Common bile duct: Diameter: 2 mm-3 mm Liver: Increased echogenicity of the liver. The surface does not appear nodular. 1 cm anechoic cystic structure in the left liver with through transmission and no internal complexity. Cystic lesion of the right liver, towards the dome of  the liver measures 3.9 cm x 3.9 cm x 4.0 cm. Through transmission with no significant internal complexity. No associated flow. IVC: No abnormality visualized. Pancreas: Visualized portion unremarkable. Spleen: Size and appearance within normal limits. Right Kidney: Length: 14.0 cm. No hydronephrosis. Echogenicity somewhat increased. Several hypoechoic lesions of the right kidney involving the cortex. The largest 2 measure 3.6 cm x 2.7 cm x 3.7 cm and 2.7 cm x 2.5 cm x 2.3 cm. No internal flow and there is posterior Q stick enhancement. The more medial demonstrates a poorly defined margin. Left Kidney: Length: 16.3 cm. No hydronephrosis. Echogenicity similar to the right. Anechoic lesion at the inferior left kidney measures 6.5 cm x 4.7 cm x 5.4 cm. Through transmission with no significant internal complexity. Additional lesion on the left measures 4.8 cm x 5.6 cm x 5.5 cm. Abdominal aorta: No aneurysm visualized. Other findings: None. IMPRESSION: No hydronephrosis of the bilateral kidneys. Echogenicity of the bilateral kidneys is somewhat increased, suggesting chronic medical renal disease. Bilateral kidneys demonstrate cystic lesions. While most likely benign, there is a right-sided lesion with a poorly defined wall. Further evaluation with contrast-enhanced CT or MR may be useful. Circumferential gallbladder wall thickening with negative sonographic Murphy's sign. This finding may be seen with chronic liver disease or potentially right-sided heart failure. Correlation with presentation and lab values may be useful. The partially visualized lesions of the liver are not well characterized on the current ultrasound. The lesion at the liver dome appears cystic, however, correlation with contrast-enhanced CT or MR is recommended. Electronically Signed   By: Corrie Mckusick D.O.   On: 09/21/2017 07:08   Dg Chest Portable 1 View  Result Date: 09/19/2017 CLINICAL DATA:  Chest pain EXAM: PORTABLE CHEST 1 VIEW  COMPARISON:   09/16/2017 FINDINGS: Cardiac shadow remains enlarged. Right basilar atelectasis and associated effusion is again identified stable from the prior exam. Left lung is clear. No bony abnormality is noted. IMPRESSION: Right basilar atelectasis with associated effusions stable from the previous exam. Electronically Signed   By: Inez Catalina M.D.   On: 09/19/2017 11:43   Dg Bone Survey Met  Result Date: 09/19/2017 CLINICAL DATA:  Lytic lesions shown on prior chest CT EXAM: METASTATIC BONE SURVEY COMPARISON:  Chest CT performed earlier today FINDINGS: Possible small lytic lesions scattered throughout the calvarium. Lytic lesions noted in the mid to distal left clavicle and possible mid right clavicle. Scattered small lytic lesions throughout the humeri bilaterally and within the proximal to mid left radius and ulna. Small lytic lesion within the mid right radius. No focal lytic lesion or acute bony abnormality within the cervical, thoracic or lumbar spine. Mild degenerative facet disease throughout the lumbar spine. Probable small lytic lesions within the proximal to midshaft of the left femur and possibly within the shafts of the left tibia and fibula. Lytic lesions scattered throughout the right tibia. Heart is borderline in size. Right pleural effusion with right lower lobe atelectasis. Left lung clear. Density projecting over the left lateral lower chest represents the soft tissue mass associated with the left 7th rib anteriorly. IMPRESSION: Numerous small lytic lesions scattered throughout the skull including the calvarium, bilateral clavicles, humeri, radius/ulna bilaterally, left femur, and bilateral tibia/fibula. Findings compatible with metastatic disease or myeloma. The sternum and rib lesions are better seen on chest CT. Soft tissue density over the left lower chest corresponds to the soft tissue mass associated with the anterior left 7th rib. Small to moderate right pleural effusion with right base  atelectasis. Electronically Signed   By: Rolm Baptise M.D.   On: 09/19/2017 17:13   Ct Bone Marrow Biopsy & Aspiration  Result Date: 09/23/2017 CLINICAL DATA:  Lytic bone lesions, abnormal serum electrophoresis and clinical suspicion of multiple myeloma. Bone marrow biopsy required for further workup. EXAM: CT GUIDED BONE MARROW ASPIRATION AND BIOPSY ANESTHESIA/SEDATION: Versed 1.0 mg IV, Fentanyl 25 mcg IV Total Moderate Sedation Time:   16 minutes. The patient's level of consciousness and physiologic status were continuously monitored during the procedure by Radiology nursing. PROCEDURE: The procedure risks, benefits, and alternatives were explained to the patient. Questions regarding the procedure were encouraged and answered. The patient understands and consents to the procedure. A time out was performed prior to initiating the procedure. The right gluteal region was prepped with chlorhexidine. Sterile gown and sterile gloves were used for the procedure. Local anesthesia was provided with 1% Lidocaine. Under CT guidance, an 11 gauge On Control bone cutting needle was advanced from a posterior approach into the right iliac bone. Needle positioning was confirmed with CT. Initial non heparinized and heparinized aspirate samples were obtained of bone marrow. Core biopsy was performed via the On Control drill needle. COMPLICATIONS: None FINDINGS: Inspection of initial aspirate did reveal visible particles. Intact core biopsy sample was obtained. IMPRESSION: CT guided bone marrow biopsy of right posterior iliac bone with both aspirate and core samples obtained. Electronically Signed   By: Aletta Edouard M.D.   On: 09/23/2017 15:32   US Abdomen Limited Ruq  Result Date: 09/28/2017 CLINICAL DATA:  Assess liver and evaluate for ascites. EXAM: ULTRASOUND ABDOMEN LIMITED RIGHT UPPER QUADRANT COMPARISON:  Abdominal ultrasound performed 09/21/2017 FINDINGS: Gallbladder: Gallbladder wall thickening is noted, measuring  up to 6 mm. No stones  or sludge are seen. No pericholecystic fluid is appreciated. No ultrasonographic Murphy's sign is identified. Common bile duct: Diameter: 0.4 cm, within normal limits in caliber. Liver: Several cysts are noted at the right hepatic dome. Hepatic cysts measure up to 3.1 cm in size. Within normal limits in parenchymal echogenicity. Portal vein is patent on color Doppler imaging with normal direction of blood flow towards the liver. No definite ascites is noted. IMPRESSION: 1. No acute abnormality seen to explain the patient's symptoms. No ascites seen. 2. Gallbladder wall thickening is nonspecific and may reflect mild chronic inflammation. 3. Hepatic cysts noted. Electronically Signed   By: Garald Balding M.D.   On: 09/28/2017 04:11    ASSESSMENT AND PLAN: This is a very pleasant 67 years old African female originally from [place] who is visiting her son in [place] and was recently diagnosed with IgA multiple myeloma. I had a lengthy discussion with the patient and her daughter-in-law about her condition and treatment options. I recommended for the patient treatment with systemic therapy with Velcade 1.3 mg/M2 weekly in addition to Revlimid 25 mg p.o. daily for 14 days every 3 weeks as well as weekly Decadron 40 mg orally.  The patient was also given the option of palliative care. She is interested in proceeding with treatment as soon as possible.  There will be a lot of logistic issue to take care of this patient with lack of insurance.  She will be seen by the pharmacist for oral medication as well as the pharmacy technician for the subcutaneous injection patient assistant. She is expected to start the first dose of this treatment next week. We will continue to monitor her blood count closely during her treatment. I would also consider the patient for treatment with Zometa but she will need a dental clearance before starting this treatment.  I will refer her to Dr.  Enrique Sack for dental evaluation. I will also start the patient on prophylactic dose of acyclovir because of her treatment with Velcade. She will come back for follow-up visit in 2 weeks for evaluation and management of any adverse effect of her treatment. For hypertension, she was strongly advised to take her blood pressure medication as prescribed and to follow-up with her primary care physician for adjustment of her medication as needed. The patient was advised to call immediately if she has any concerning symptoms in the interval. The patient voices understanding of current disease status and treatment options and is in agreement with the current care plan.  All questions were answered. The patient knows to call the clinic with any problems, questions or concerns. We can certainly see the patient much sooner if necessary.  I spent 15 minutes counseling the patient face to face. The total time spent in the appointment was 25 minutes.  Disclaimer: This note was dictated with voice recognition software. Similar sounding words can inadvertently be transcribed and may not be corrected upon review.

## 2017-10-02 NOTE — Telephone Encounter (Signed)
Oral Oncology Pharmacist Encounter  Received new referral for Revlimid (lenalidomide) for the treatment of newly diagnosed multiple myeloma in conjunction with bortezomib and dexamethasone, planned duration 4-6 cycles of induction therapy.  Labs from 10/02/2017 assessed  Noted SCr=1.32, est CrCl ~ 60 mL/min Discussed dose reduction with MD, MD will proceed with full dosing at this time due to nature of disease  Noted elevations in LFT, no dose adjustment per manufacturer recommendations for hepatic dysfunction  Noted thrombocytopenia, pltc has improved after initiation of dexamethasone, will continue to be monitored, OK for treatment initiation  Current medication list in Epic reviewed, no DDIs with Revlmid identified. Prescriptions for acyclovir and dexamethasone re-sent to [place].  Patient without insurance coverage as [place]. Oral oncology clinic will work with patient and family for medication acquisition.  Oral Oncology Clinic will continue to follow.  Johny Drilling, PharmD, BCPS, BCOP  10/02/2017 2:23 PM Oral Oncology Clinic [phone]

## 2017-10-05 ENCOUNTER — Telehealth: Payer: Self-pay | Admitting: Pharmacist

## 2017-10-05 MED ORDER — DEXAMETHASONE 4 MG PO TABS: 40.0000 mg | ORAL_TABLET | ORAL | 3 refills | Status: DC

## 2017-10-05 MED ORDER — ACYCLOVIR 200 MG PO CAPS: 200.0000 mg | ORAL_CAPSULE | Freq: Two times a day (BID) | ORAL | 2 refills | Status: DC

## 2017-10-05 NOTE — Telephone Encounter (Signed)
Oral Oncology Pharmacist Encounter  Patient without insurance coverage.  Application to [employer] assistance program in an effort to obtain Revlimid at $0 out of pocket expense to patient through manufacturer compassionate use program submitted online.  This encounter will continue to be updated until final determination.  Johny Drilling, PharmD, BCPS, BCOP  10/05/2017  12:09 PM  Oral Oncology Clinic [phone]

## 2017-10-06 ENCOUNTER — Other Ambulatory Visit: Payer: Self-pay | Admitting: Medical Oncology

## 2017-10-06 ENCOUNTER — Encounter (HOSPITAL_COMMUNITY): Payer: Self-pay | Admitting: Dentistry

## 2017-10-06 ENCOUNTER — Encounter: Payer: Self-pay | Admitting: Pharmacy Technician

## 2017-10-06 ENCOUNTER — Encounter (HOSPITAL_COMMUNITY): Payer: Self-pay | Admitting: Internal Medicine

## 2017-10-06 ENCOUNTER — Ambulatory Visit (HOSPITAL_COMMUNITY): Payer: Self-pay | Admitting: Dentistry

## 2017-10-06 ENCOUNTER — Inpatient Hospital Stay: Payer: Self-pay

## 2017-10-06 VITALS — BP 144/88 | HR 75 | Temp 98.1°F

## 2017-10-06 DIAGNOSIS — K0530 Chronic periodontitis, unspecified: Secondary | ICD-10-CM

## 2017-10-06 DIAGNOSIS — M264 Malocclusion, unspecified: Secondary | ICD-10-CM

## 2017-10-06 DIAGNOSIS — M899 Disorder of bone, unspecified: Secondary | ICD-10-CM

## 2017-10-06 DIAGNOSIS — C9000 Multiple myeloma not having achieved remission: Secondary | ICD-10-CM

## 2017-10-06 DIAGNOSIS — M2632 Excessive spacing of fully erupted teeth: Secondary | ICD-10-CM

## 2017-10-06 DIAGNOSIS — K0889 Other specified disorders of teeth and supporting structures: Secondary | ICD-10-CM

## 2017-10-06 DIAGNOSIS — K036 Deposits [accretions] on teeth: Secondary | ICD-10-CM

## 2017-10-06 DIAGNOSIS — K06010 Localized gingival recession, unspecified: Secondary | ICD-10-CM

## 2017-10-06 DIAGNOSIS — D696 Thrombocytopenia, unspecified: Secondary | ICD-10-CM

## 2017-10-06 DIAGNOSIS — K030 Excessive attrition of teeth: Secondary | ICD-10-CM

## 2017-10-06 DIAGNOSIS — Z01818 Encounter for other preprocedural examination: Secondary | ICD-10-CM

## 2017-10-06 NOTE — Progress Notes (Signed)
DENTAL CONSULTATION  Date of Consultation:  10/06/2017 Patient Name:   Debbie Chan Date of Birth:   [birth year] Medical Record Number: [id number]  VITALS: BP (!) 144/88 (BP Location: Right Arm)   Pulse 75   Temp 98.1 F (36.7 C)   CHIEF COMPLAINT: Patient referred by Dr. Curt Bears for a dental consultation.  HPI: Lylith Bebeau is a 67 year old [place] woman that presents for a dental consultation. The patient was recently diagnosed with multiple myeloma. Patient with multiple lytic bone lesions. Patient with anticipated Zometa therapy. Patient is now seen as part of a pre-Zometa therapy dental protocol examination.  The dental history is provided by the patient through her son and daughter acting as the interpreter.The patient currently denies acute toothaches, swellings, or abscesses. Patient has never seen a dentist. The patient denies having dental phobia.  PROBLEM LIST: Patient Active Problem List   Diagnosis Date Noted  . Multiple myeloma without remission (HCC)     Priority: High  . Encounter for antineoplastic chemotherapy 10/02/2017  . Goals of care, counseling/discussion 10/02/2017  . Hepatomegaly   . Atrial flutter ([place])   . Chest pain 09/19/2017  . Tachycardia 09/19/2017  . Lytic bone lesions on xray 09/19/2017  . Anemia 09/19/2017  . AKI (acute kidney injury) ([place]) 09/19/2017  . Pleural effusion on right 09/19/2017    PMH: History reviewed. No pertinent past medical history.  PSH: History reviewed. No pertinent surgical history.  ALLERGIES: No Known Allergies  MEDICATIONS: Current Outpatient Medications  Medication Sig Dispense Refill  . acyclovir (ZOVIRAX) 200 MG capsule Take 1 capsule (200 mg total) by mouth 2 (two) times daily. 60 capsule 2  . blood glucose meter kit and supplies Dispense based on patient and insurance preference. Use up to four times daily as directed. (FOR ICD-10 E10.9, E11.9). 1 each 0  . dexamethasone (DECADRON) 4 MG tablet  Take 10 tablets (40 mg total) by mouth every 7 (seven) days. 40 tablet 3  . feeding supplement, ENSURE ENLIVE, (ENSURE ENLIVE) LIQD Take 237 mLs by mouth 2 (two) times daily between meals. 14220 mL 0  . metoprolol tartrate (LOPRESSOR) 25 MG tablet Take 0.5 tablets (12.5 mg total) by mouth 2 (two) times daily. 30 tablet 0  . polyethylene glycol (MIRALAX / GLYCOLAX) packet Take 17 g by mouth daily. 14 each 0  . prochlorperazine (COMPAZINE) 10 MG tablet Take 1 tablet (10 mg total) by mouth every 6 (six) hours as needed for nausea or vomiting. 30 tablet 0   No current facility-administered medications for this visit.     LABS: Lab Results  Component Value Date   WBC 9.3 10/02/2017   HGB 8.8 (L) 10/02/2017   HCT 27.2 (L) 10/02/2017   MCV 87.7 10/02/2017   PLT 81 (L) 10/02/2017      Component Value Date/Time   NA 139 10/02/2017 0900   K 4.7 10/02/2017 0900   CL 103 10/02/2017 0900   CO2 22 10/02/2017 0900   GLUCOSE 258 (H) 10/02/2017 0900   BUN 30 (H) 10/02/2017 0900   CREATININE 1.32 (H) 10/02/2017 0900   CALCIUM 8.4 (L) 10/02/2017 0900   GFRNONAA 41 (L) 10/02/2017 0900   GFRAA 47 (L) 10/02/2017 0900   Lab Results  Component Value Date   INR 2.12 09/27/2017   INR 2.62 09/26/2017   INR 3.45 09/25/2017   No results found for: PTT  SOCIAL HISTORY: Social History   Socioeconomic History  . Marital status: Widowed    Spouse name: Not  on file  . Number of children: 4  . Years of education: Not on file  . Highest education level: Not on file  Occupational History  . Not on file  Social Needs  . Financial resource strain: Not on file  . Food insecurity:    Worry: Not on file    Inability: Not on file  . Transportation needs:    Medical: Not on file    Non-medical: Not on file  Tobacco Use  . Smoking status: Never Smoker  . Smokeless tobacco: Never Used  Substance and Sexual Activity  . Alcohol use: Never    Frequency: Never  . Drug use: Never  . Sexual activity:  Not on file  Lifestyle  . Physical activity:    Days per week: Not on file    Minutes per session: Not on file  . Stress: Not on file  Relationships  . Social connections:    Talks on phone: Not on file    Gets together: Not on file    Attends religious service: Not on file    Active member of club or organization: Not on file    Attends meetings of clubs or organizations: Not on file    Relationship status: Not on file  . Intimate partner violence:    Fear of current or ex partner: Not on file    Emotionally abused: Not on file    Physically abused: Not on file    Forced sexual activity: Not on file  Other Topics Concern  . Not on file  Social History Narrative  . Not on file     FAMILY HISTORY: History reviewed. No pertinent family history.  REVIEW OF SYSTEMS: Reviewed with the patient as per History of present illness. Psych: Patient denies having dental phobia.  DENTAL HISTORY: CHIEF COMPLAINT: Patient referred by Dr. Curt Bears for a dental consultation.  HPI: Debbie Chan is a 67 year old [place] woman that presents for a dental consultation. The patient was recently diagnosed with multiple myeloma. Patient with multiple lytic bone lesions. Patient with anticipated Zometa therapy. Patient is now seen as part of a pre-Zometa therapy dental protocol examination.  The dental history is provided by the patient through her son and daughter acting as the interpreter.The patient currently denies acute toothaches, swellings, or abscesses. Patient has never seen a dentist. The patient denies having dental phobia.  DENTAL EXAMINATION: GENERAL:  The patient is a well-developed, well-nourished female in no acute distress. HEAD AND NECK:  There is no palpable neck lymphadenopathy. The patient denies acute TMJ symptoms. INTRAORAL EXAM:  The patient has normal saliva.  There is no evidence of oral abscess formation. DENTITION: There are no missing teeth. There is  generalized incisal and occlusal attrition. Multiple diastemas are noted. PERIODONTAL:  The patient has chronic periodontitis, with plaque and calculus accumulations, gingival recession, and mandibular anterior incipient tooth mobility. DENTAL CARIES/SUBOPTIMAL RESTORATIONS:  No obvious dental caries are noted. ENDODONTIC:  The patient denies acute pulpitis symptoms. I do not see any evidence of periapical pathology or radiolucency. CROWN AND BRIDGE: There are no crown or bridge restorations. PROSTHODONTIC:  There are no partial dentures. OCCLUSION:  The patient has a poor occlusal scheme but a stable occlusion at this time.  RADIOGRAPHIC INTERPRETATION: An orthopantogram was taken. This was supplemented with a full series of suboptimal dental x-rays secondary to lack of patient cooperation. There are no missing teeth. Multiple diastemas are noted. There is incipient to moderate bone loss noted. Radiographic calculus  is noted.   There is no evidence of periapical pathology or radiolucency. Generalized and occlusal and incisal attrition is noted.  ASSESSMENTS: 1. Multiple myeloma 2. Multiple lytic bone lesions 3. Anemia and thrombocytopenia 4. Pre-Zometa therapy dental protocol examination 5. Generalized incisal and occlusal attrition 6. Multiple diastemas 7. Chronic periodontitis with bone loss 8. Gingival recession 9. Accretions 10. Mandibular anterior tooth mobility 11. Poor occlusal scheme 12. Risk for bleeding with invasive dental procedures due to current thrombocytopenia  PLAN/RECOMMENDATIONS: 1. I discussed the risks, benefits, and complications of various treatment options with the patient and family in relationship to her medical and dental conditions, anticipated chemotherapy and Zometa therapy, and future risk for osteonecrosis of the jaw with invasive dental procedures upon administration of Zometa therapy. We discussed various treatment options to include no treatment,  periodontal therapy, dental restorations, root canal therapy, crown and bridge therapy, and implant therapy. The patient and family currently wish to proceed with initial periodontal therapy with gross debridement of teeth.  The patient will then need to establish treatment with a primary dentist to continue periodontal maintenance procedures. Patient family are aware that future extractions should be avoided at all cost due to the potential risk for osteonecrosis of the jaw associated with the anticipated use of Zometa therapy. The patient and family expressed understanding.   2. Discussion of findings with medical team and coordination of future medical and dental care as needed.  Dr. Curt Bears has been contacted and feels that the 82,000 platelets are acceptable for initial periodontal therapy at this time without need for platelet transfusion or additional waiting time to allow platelet counts to arise. The patient has been scheduled for next Wednesday at 11 AM for the gross debridement procedure.  I spent in excess of  120 minutes during the conduct of this consultation and >50% of this time involved direct face-to-face encounter for counseling and/or coordination of the patient's care.    Lenn Cal, DDS

## 2017-10-06 NOTE — Telephone Encounter (Signed)
Oral Oncology Pharmacist Encounter  Received notification that patient has been successfully enrolled with [employer] Patient Assistance program to receive Revlimid at $0 out of pocket cost to patient for 6 months Effective dates: 10/05/2017-04/07/2018  Revlimid prescriptions will be filled through [employer] by [employer]. Prescription faxed by provider RN today.  Johny Drilling, PharmD, BCPS, BCOP  10/06/2017 3:24 PM Oral Oncology Clinic [phone]

## 2017-10-06 NOTE — Progress Notes (Signed)
The patient is approved for drug assistance by [facility] for Velcade. Enrollment is based on self pay and is effective until 10/05/18.  Drug replacement will begin for DOS 10/08/17.

## 2017-10-06 NOTE — Patient Instructions (Signed)
The patient has been scheduled for the initial periodontal therapy for next Wednesday at 11 AM. The patient will need to establish care under a general dentist of her choice to provide future periodontal maintenance procedures. Patient is aware of the potential risk for osteonecrosis of the jaw related to anticipated Zometa therapy with future invasive dental procedures. Patient and family were cautioned about future extractions without contacting Dr. Curt Bears.  Dr. Enrique Sack

## 2017-10-07 ENCOUNTER — Other Ambulatory Visit: Payer: Self-pay

## 2017-10-07 ENCOUNTER — Ambulatory Visit: Payer: Self-pay | Admitting: Internal Medicine

## 2017-10-08 ENCOUNTER — Other Ambulatory Visit: Payer: Self-pay | Admitting: Medical

## 2017-10-08 ENCOUNTER — Inpatient Hospital Stay: Payer: Self-pay

## 2017-10-08 ENCOUNTER — Inpatient Hospital Stay (HOSPITAL_BASED_OUTPATIENT_CLINIC_OR_DEPARTMENT_OTHER): Payer: Self-pay | Admitting: Medical

## 2017-10-08 VITALS — BP 155/91 | HR 80 | Temp 97.7°F | Resp 18

## 2017-10-08 DIAGNOSIS — Z5111 Encounter for antineoplastic chemotherapy: Secondary | ICD-10-CM

## 2017-10-08 DIAGNOSIS — R600 Localized edema: Secondary | ICD-10-CM

## 2017-10-08 DIAGNOSIS — R634 Abnormal weight loss: Secondary | ICD-10-CM

## 2017-10-08 DIAGNOSIS — Z7901 Long term (current) use of anticoagulants: Secondary | ICD-10-CM

## 2017-10-08 DIAGNOSIS — R140 Abdominal distension (gaseous): Secondary | ICD-10-CM

## 2017-10-08 DIAGNOSIS — M5136 Other intervertebral disc degeneration, lumbar region: Secondary | ICD-10-CM

## 2017-10-08 DIAGNOSIS — I10 Essential (primary) hypertension: Secondary | ICD-10-CM

## 2017-10-08 DIAGNOSIS — Z79899 Other long term (current) drug therapy: Secondary | ICD-10-CM

## 2017-10-08 DIAGNOSIS — J90 Pleural effusion, not elsewhere classified: Secondary | ICD-10-CM

## 2017-10-08 DIAGNOSIS — C9000 Multiple myeloma not having achieved remission: Secondary | ICD-10-CM

## 2017-10-08 DIAGNOSIS — R109 Unspecified abdominal pain: Secondary | ICD-10-CM

## 2017-10-08 DIAGNOSIS — K7689 Other specified diseases of liver: Secondary | ICD-10-CM

## 2017-10-08 DIAGNOSIS — E8809 Other disorders of plasma-protein metabolism, not elsewhere classified: Secondary | ICD-10-CM

## 2017-10-08 DIAGNOSIS — R5383 Other fatigue: Secondary | ICD-10-CM

## 2017-10-08 DIAGNOSIS — I119 Hypertensive heart disease without heart failure: Secondary | ICD-10-CM

## 2017-10-08 DIAGNOSIS — R609 Edema, unspecified: Secondary | ICD-10-CM

## 2017-10-08 DIAGNOSIS — I517 Cardiomegaly: Secondary | ICD-10-CM

## 2017-10-08 DIAGNOSIS — I700 Atherosclerosis of aorta: Secondary | ICD-10-CM

## 2017-10-08 LAB — CBC WITH DIFFERENTIAL (CANCER CENTER ONLY)
BASOS ABS: 0 10*3/uL (ref 0.0–0.1)
Basophils Relative: 0 %
EOS ABS: 0.1 10*3/uL (ref 0.0–0.5)
Eosinophils Relative: 2 %
HEMATOCRIT: 28.2 % — AB (ref 34.8–46.6)
Hemoglobin: 9.1 g/dL — ABNORMAL LOW (ref 11.6–15.9)
LYMPHS PCT: 27 %
Lymphs Abs: 1.2 10*3/uL (ref 0.9–3.3)
MCH: 28.3 pg (ref 25.1–34.0)
MCHC: 32.3 g/dL (ref 31.5–36.0)
MCV: 87.9 fL (ref 79.5–101.0)
MONO ABS: 0.2 10*3/uL (ref 0.1–0.9)
Monocytes Relative: 4 %
Neutro Abs: 3.1 10*3/uL (ref 1.5–6.5)
Neutrophils Relative %: 67 %
PLATELETS: 71 10*3/uL — AB (ref 145–400)
RBC: 3.21 MIL/uL — AB (ref 3.70–5.45)
RDW: 23.4 % — AB (ref 11.2–14.5)
WBC: 4.6 10*3/uL (ref 3.9–10.3)
nRBC: 1 /100 WBC — ABNORMAL HIGH

## 2017-10-08 LAB — CMP (CANCER CENTER ONLY)
ALT: 60 U/L — AB (ref 0–44)
AST: 48 U/L — AB (ref 15–41)
Albumin: 2.5 g/dL — ABNORMAL LOW (ref 3.5–5.0)
Alkaline Phosphatase: 84 U/L (ref 38–126)
Anion gap: 12 (ref 5–15)
BILIRUBIN TOTAL: 0.9 mg/dL (ref 0.3–1.2)
BUN: 17 mg/dL (ref 8–23)
CHLORIDE: 105 mmol/L (ref 98–111)
CO2: 23 mmol/L (ref 22–32)
CREATININE: 1.33 mg/dL — AB (ref 0.44–1.00)
Calcium: 8.5 mg/dL — ABNORMAL LOW (ref 8.9–10.3)
GFR, EST AFRICAN AMERICAN: 47 mL/min — AB (ref >60)
GFR, EST NON AFRICAN AMERICAN: 40 mL/min — AB (ref >60)
Glucose, Bld: 125 mg/dL — ABNORMAL HIGH (ref 70–99)
Potassium: 4.3 mmol/L (ref 3.5–5.1)
Sodium: 140 mmol/L (ref 135–145)
Total Protein: 9.1 g/dL — ABNORMAL HIGH (ref 6.5–8.1)

## 2017-10-08 MED ORDER — FUROSEMIDE 20 MG PO TABS: ORAL_TABLET | ORAL | 2 refills | Status: DC

## 2017-10-08 MED ORDER — WARFARIN SODIUM 2 MG PO TABS: 2.0000 mg | ORAL_TABLET | Freq: Every day | ORAL | 5 refills | Status: DC

## 2017-10-08 MED ORDER — PROCHLORPERAZINE MALEATE 10 MG PO TABS
ORAL_TABLET | ORAL | Status: AC
Start: 2017-10-08 — End: ?
  Filled 2017-10-08: qty 1

## 2017-10-08 MED ORDER — BORTEZOMIB CHEMO SQ INJECTION 3.5 MG (2.5MG/ML)
1.3000 mg/m2 | Freq: Once | INTRAMUSCULAR | Status: AC
  Administered 2017-10-08: 2.75 mg via SUBCUTANEOUS
  Filled 2017-10-08: qty 1.1

## 2017-10-08 MED ORDER — PROCHLORPERAZINE MALEATE 10 MG PO TABS
10.0000 mg | ORAL_TABLET | Freq: Once | ORAL | Status: AC
  Administered 2017-10-08: 10 mg via ORAL

## 2017-10-08 NOTE — Progress Notes (Signed)
Per Julien Nordmann it is okay to treat pt today with velcade and plts 71 k.

## 2017-10-08 NOTE — Patient Instructions (Addendum)
[facility] Discharge Instructions for Patients Receiving Chemotherapy  Today you received the following chemotherapy agents Velcade  To help prevent nausea and vomiting after your treatment, we encourage you to take your nausea medication as directed   If you develop nausea and vomiting that is not controlled by your nausea medication, call the clinic.   BELOW ARE SYMPTOMS THAT SHOULD BE REPORTED IMMEDIATELY:  *FEVER GREATER THAN 100.5 F  *CHILLS WITH OR WITHOUT FEVER  NAUSEA AND VOMITING THAT IS NOT CONTROLLED WITH YOUR NAUSEA MEDICATION  *UNUSUAL SHORTNESS OF BREATH  *UNUSUAL BRUISING OR BLEEDING  TENDERNESS IN MOUTH AND THROAT WITH OR WITHOUT PRESENCE OF ULCERS  *URINARY PROBLEMS  *BOWEL PROBLEMS  UNUSUAL RASH Items with * indicate a potential emergency and should be followed up as soon as possible.  Feel free to call the clinic should you have any questions or concerns. The clinic phone number is [phone].  Please show the [place] at check-in to the Emergency Department and triage nurse.   Bortezomib (velcade) injection What is this medicine? BORTEZOMIB (bor TEZ oh mib) is a medicine that targets proteins in cancer cells and stops the cancer cells from growing. It is used to treat multiple myeloma and mantle-cell lymphoma. This medicine may be used for other purposes; ask your health care provider or pharmacist if you have questions. COMMON BRAND NAME(S): Velcade What should I tell my health care provider before I take this medicine? They need to know if you have any of these conditions: -diabetes -heart disease -irregular heartbeat -liver disease -on hemodialysis -low blood counts, like low white blood cells, platelets, or hemoglobin -peripheral neuropathy -taking medicine for blood pressure -an unusual or allergic reaction to bortezomib, mannitol, boron, other medicines, foods, dyes, or preservatives -pregnant or trying to get  pregnant -breast-feeding How should I use this medicine? This medicine is for injection into a vein or for injection under the skin. It is given by a health care professional in a hospital or clinic setting. Talk to your pediatrician regarding the use of this medicine in children. Special care may be needed. Overdosage: If you think you have taken too much of this medicine contact a poison control center or emergency room at once. NOTE: This medicine is only for you. Do not share this medicine with others. What if I miss a dose? It is important not to miss your dose. Call your doctor or health care professional if you are unable to keep an appointment. What may interact with this medicine? This medicine may interact with the following medications: -ketoconazole -rifampin -ritonavir -St. John's Wort This list may not describe all possible interactions. Give your health care provider a list of all the medicines, herbs, non-prescription drugs, or dietary supplements you use. Also tell them if you smoke, drink alcohol, or use illegal drugs. Some items may interact with your medicine. What should I watch for while using this medicine? You may get drowsy or dizzy. Do not drive, use machinery, or do anything that needs mental alertness until you know how this medicine affects you. Do not stand or sit up quickly, especially if you are an older patient. This reduces the risk of dizzy or fainting spells. In some cases, you may be given additional medicines to help with side effects. Follow all directions for their use. Call your doctor or health care professional for advice if you get a fever, chills or sore throat, or other symptoms of a cold or flu. Do  not treat yourself. This drug decreases your body's ability to fight infections. Try to avoid being around people who are sick. This medicine may increase your risk to bruise or bleed. Call your doctor or health care professional if you notice any unusual  bleeding. You may need blood work done while you are taking this medicine. In some patients, this medicine may cause a serious brain infection that may cause death. If you have any problems seeing, thinking, speaking, walking, or standing, tell your doctor right away. If you cannot reach your doctor, urgently seek other source of medical care. Check with your doctor or health care professional if you get an attack of severe diarrhea, nausea and vomiting, or if you sweat a lot. The loss of too much body fluid can make it dangerous for you to take this medicine. Do not become pregnant while taking this medicine or for at least 2 months after stopping it. Women should inform their doctor if they wish to become pregnant or think they might be pregnant. Men should not father a child while taking this medicine and for at least 2 months after stopping it. There is a potential for serious side effects to an unborn child. Talk to your health care professional or pharmacist for more information. Do not breast-feed an infant while taking this medicine or for 2 months after stopping it. This medicine may interfere with the ability to have a child. You should talk with your doctor or health care professional if you are concerned about your fertility. What side effects may I notice from receiving this medicine? Side effects that you should report to your doctor or health care professional as soon as possible: -allergic reactions like skin rash, itching or hives, swelling of the face, lips, or tongue -breathing problems -changes in hearing -changes in vision -fast, irregular heartbeat -feeling faint or lightheaded, falls -pain, tingling, numbness in the hands or feet -right upper belly pain -seizures -swelling of the ankles, feet, hands -unusual bleeding or bruising -unusually weak or tired -vomiting -yellowing of the eyes or skin Side effects that usually do not require medical attention (report to your  doctor or health care professional if they continue or are bothersome): -changes in emotions or moods -constipation -diarrhea -loss of appetite -headache -irritation at site where injected -nausea This list may not describe all possible side effects. Call your doctor for medical advice about side effects. You may report side effects to FDA at 1-800-FDA-1088. Where should I keep my medicine? This drug is given in a hospital or clinic and will not be stored at home. NOTE: This sheet is a summary. It may not cover all possible information. If you have questions about this medicine, talk to your doctor, pharmacist, or health care provider.  2018 Elsevier/Gold Standard (2016-01-10 15:53:51)   Edema Edema is when you have too much fluid in your body or under your skin. Edema may make your legs, feet, and ankles swell up. Swelling is also common in looser tissues, like around your eyes. This is a common condition. It gets more common as you get older. There are many possible causes of edema. Eating too much salt (sodium) and being on your feet or sitting for a long time can cause edema in your legs, feet, and ankles. Hot weather may make edema worse. Edema is usually painless. Your skin may look swollen or shiny. Follow these instructions at home:  Keep the swollen body part raised (elevated) above the level of your heart  when you are sitting or lying down.  Do not sit still or stand for a long time.  Do not wear tight clothes. Do not wear garters on your upper legs.  Exercise your legs. This can help the swelling go down.  Wear elastic bandages or support stockings as told by your doctor.  Eat a low-salt (low-sodium) diet to reduce fluid as told by your doctor.  Depending on the cause of your swelling, you may need to limit how much fluid you drink (fluid restriction).  Take over-the-counter and prescription medicines only as told by your doctor. Contact a doctor if:  Treatment is not  working.  You have heart, liver, or kidney disease and have symptoms of edema.  You have sudden and unexplained weight gain. Get help right away if:  You have shortness of breath or chest pain.  You cannot breathe when you lie down.  You have pain, redness, or warmth in the swollen areas.  You have heart, liver, or kidney disease and get edema all of a sudden.  You have a fever and your symptoms get worse all of a sudden. Summary  Edema is when you have too much fluid in your body or under your skin.  Edema may make your legs, feet, and ankles swell up. Swelling is also common in looser tissues, like around your eyes.  Raise (elevate) the swollen body part above the level of your heart when you are sitting or lying down.  Follow your doctor's instructions about diet and how much fluid you can drink (fluid restriction). This information is not intended to replace advice given to you by your health care provider. Make sure you discuss any questions you have with your health care provider. Document Released: 07/30/2007 Document Revised: 02/29/2016 Document Reviewed: 02/29/2016 Elsevier Interactive Patient Education  2017 [employer].

## 2017-10-09 ENCOUNTER — Encounter: Payer: Self-pay | Admitting: Internal Medicine

## 2017-10-09 ENCOUNTER — Telehealth: Payer: Self-pay | Admitting: Pharmacist

## 2017-10-09 DIAGNOSIS — C9000 Multiple myeloma not having achieved remission: Secondary | ICD-10-CM

## 2017-10-09 MED ORDER — FUROSEMIDE 20 MG PO TABS: ORAL_TABLET | ORAL | 2 refills | Status: DC

## 2017-10-09 MED ORDER — WARFARIN SODIUM 2 MG PO TABS: 2.0000 mg | ORAL_TABLET | Freq: Every day | ORAL | 5 refills | Status: DC

## 2017-10-09 NOTE — Telephone Encounter (Signed)
Oral Oncology Patient Advocate Encounter  I confirmed with Debbie Chan daughter that Revlimid was delivered today 10/09/17  [place] Patient [facility] Phone [phone] Fax [phone]

## 2017-10-09 NOTE — Progress Notes (Signed)
[facility] Progress Note   Debbie Chan [id number] March 20, 1950 67 y.o.  Debbie Chan is managed by Dr. Fanny Bien. Debbie Chan  Actively treated with chemotherapy/immunotherapy: yes  Current Therapy: Velcade with receipt of Revlimid pending  Last Treated: 10/08/2017 (cycle 1, day 1)  Assessment: Plan:    Peripheral edema  Multiple myeloma without remission (HCC)  Hypoalbuminemia  Essential hypertension   Peripheral edema suspected secondary to hypoalbuminemia and hypertension: Patient was given a prescription for Lasix 20 mg once daily and was told to use this only for 3 days before stopping.  She will then repeat dosing with Lasix as needed for recurrent edema.  Multiple myeloma: The patient is receiving cycle 1, day 1 of Velcade today.  She is awaiting receipt of Revlimid.  Her son reports that they have been contacted yesterday regarding this medication.  She was given a prescription for Coumadin 2 mg once daily with instructions to begin this when she starts taking Revlimid.  Hypoalbuminemia.  The patient's most recent chemistry panel returned with an albumin of 2.5.  Essential hypertension.  The patient's blood pressure returned at 155/91 today.  She was told to take her metoprolol 25 mg once daily.  She was also told to limit her intake of sodium.  Please see After Visit Summary for patient specific instructions.  Future Appointments  Date Time Provider Cope  10/14/2017 11:00 AM Lenn Cal, DDS [facility] None  10/15/2017  7:45 AM [facility] LAB 3 [facility] None  10/15/2017  8:45 AM [facility] INFUSION [facility] None  10/22/2017  7:45 AM [facility] LAB 5 [facility] None  10/22/2017  8:45 AM [facility] INFUSION [facility] None  10/29/2017  1:30 PM [facility] LAB 5 [facility] None  10/29/2017  2:00 PM Curcio, Kristin R, NP [facility] None  10/29/2017  3:00 PM [facility] INFUSION [facility] None  11/05/2017  8:45 AM [facility] LAB 4  [facility] None  11/05/2017  9:45 AM [facility] INFUSION [facility] None  11/12/2017  2:30 PM [facility] LAB 4 [facility] None  11/12/2017  3:00 PM Curcio, Roselie Awkward, NP [facility] None  11/12/2017  3:30 PM [facility] INFUSION [facility] None    No orders of the defined types were placed in this encounter.      Subjective:   Patient ID:  Debbie Chan is a 67 y.o. (DOB 1950-06-20) female.  Chief Complaint: No chief complaint on file.   HPI Debbie Chan is a 67 year old female with a diagnosis of multiple myeloma who is managed by Dr. Fanny Bien. Debbie Chan and is receiving cycle 1, day 1 of Velcade today.  She is awaiting delivery of Revlimid.  She was seen and the infusion room today for her bilateral lower extremity edema.  Review of her labs shows a hemoglobin of 9.1, hematocrit of 28.2, albumin of 2.5, and a creatinine of 1.33.  The patient had an echocardiogram completed on 09/21/2017 that returned with an EF between 55 and 60%.  The patient has a history of essential hypertension and is taking metoprolol 25 mg once daily.  Medications: I have reviewed the patient's current medications.  Allergies: No Known Allergies  No past medical history on file.  No past surgical history on file.  No family history on file.  Social History   Socioeconomic History  . Marital status: Widowed    Spouse name: Not on file  . Number of children: 4  . Years of education: Not on file  . Highest education level: Not on file  Occupational History  . Not  on file  Social Needs  . Financial resource strain: Not on file  . Food insecurity:    Worry: Not on file    Inability: Not on file  . Transportation needs:    Medical: Not on file    Non-medical: Not on file  Tobacco Use  . Smoking status: Never Smoker  . Smokeless tobacco: Never Used  Substance and Sexual Activity  . Alcohol use: Never    Frequency: Never  . Drug use: Never  . Sexual activity: Not on file  Lifestyle  .  Physical activity:    Days per week: Not on file    Minutes per session: Not on file  . Stress: Not on file  Relationships  . Social connections:    Talks on phone: Not on file    Gets together: Not on file    Attends religious service: Not on file    Active member of club or organization: Not on file    Attends meetings of clubs or organizations: Not on file    Relationship status: Not on file  . Intimate partner violence:    Fear of current or ex partner: Not on file    Emotionally abused: Not on file    Physically abused: Not on file    Forced sexual activity: Not on file  Other Topics Concern  . Not on file  Social History Narrative  . Not on file    Past Medical History, Surgical history, Social history, and Family history were reviewed and updated as appropriate.   Please see review of systems for further details on the patient's review from today.   Review of Systems:  Review of Systems  Constitutional: Negative for chills, diaphoresis and fever.  HENT: Negative for trouble swallowing and voice change.   Respiratory: Negative for cough, chest tightness, shortness of breath and wheezing.   Cardiovascular: Positive for leg swelling. Negative for chest pain and palpitations.  Gastrointestinal: Negative for abdominal pain, constipation, diarrhea, nausea and vomiting.  Musculoskeletal: Negative for back pain and myalgias.  Neurological: Negative for dizziness, light-headedness and headaches.    Objective:   Physical Exam:  There were no vitals taken for this visit. ECOG: 1  Physical Exam  Constitutional: No distress.  HENT:  Head: Normocephalic and atraumatic.  Cardiovascular: Normal rate, regular rhythm and normal heart sounds. Exam reveals no gallop and no friction rub.  No murmur heard. Pulmonary/Chest: Effort normal and breath sounds normal. No respiratory distress. She has no wheezes. She has no rales.  Musculoskeletal: She exhibits edema (1+ pitting edema to  the knees bilaterally.).  Neurological: She is alert.  Skin: Skin is warm and dry. No rash noted. She is not diaphoretic. No erythema.    Lab Review:     Component Value Date/Time   NA 140 10/08/2017 0805   K 4.3 10/08/2017 0805   CL 105 10/08/2017 0805   CO2 23 10/08/2017 0805   GLUCOSE 125 (H) 10/08/2017 0805   BUN 17 10/08/2017 0805   CREATININE 1.33 (H) 10/08/2017 0805   CALCIUM 8.5 (L) 10/08/2017 0805   PROT 9.1 (H) 10/08/2017 0805   ALBUMIN 2.5 (L) 10/08/2017 0805   AST 48 (H) 10/08/2017 0805   ALT 60 (H) 10/08/2017 0805   ALKPHOS 84 10/08/2017 0805   BILITOT 0.9 10/08/2017 0805   GFRNONAA 40 (L) 10/08/2017 0805   GFRAA 47 (L) 10/08/2017 0805       Component Value Date/Time   WBC 4.6 10/08/2017 0805  WBC 11.8 (H) 09/27/2017 0215   RBC 3.21 (L) 10/08/2017 0805   HGB 9.1 (L) 10/08/2017 0805   HCT 28.2 (L) 10/08/2017 0805   PLT 71 (L) 10/08/2017 0805   MCV 87.9 10/08/2017 0805   MCH 28.3 10/08/2017 0805   MCHC 32.3 10/08/2017 0805   RDW 23.4 (H) 10/08/2017 0805   LYMPHSABS 1.2 10/08/2017 0805   MONOABS 0.2 10/08/2017 0805   EOSABS 0.1 10/08/2017 0805   BASOSABS 0.0 10/08/2017 0805   -------------------------------  Imaging from last 24 hours (if applicable):  Radiology interpretation: Dg Chest 1 View  Result Date: 09/23/2017 CLINICAL DATA:  Dyspnea. EXAM: CHEST  1 VIEW COMPARISON:  Chest CT 09/19/2017, chest radiograph 09/19/2017 FINDINGS: The cardiac silhouette is enlarged. Calcific atherosclerotic disease of the aorta. No evidence of pneumothorax. Layering right pleural effusion. Right lower and middle lobe peribronchial airspace disease versus atelectasis. Possible small left pleural effusion versus pleural thickening. Osseous structures are without acute abnormality. Soft tissues are grossly normal. IMPRESSION: Layering right pleural effusion with right lower and middle lobe peribronchial airspace disease versus atelectasis. Enlarged cardiac silhouette.  Possible small left pleural effusion versus left pleural thickening. Electronically Signed   By: Fidela Salisbury M.D.   On: 09/23/2017 13:24   Dg Chest 2 View  Result Date: 09/28/2017 CLINICAL DATA:  67 y/o  F; ascites. EXAM: CHEST - 2 VIEW COMPARISON:  09/23/2017 chest radiograph. FINDINGS: Stable cardiomegaly given projection and technique. Pulmonary venous hypertension. Increased small right and moderate left pleural effusions with associated basilar atelectasis. No acute osseous abnormality is evident. IMPRESSION: Increased small right and moderate left pleural effusions with associated basilar atelectasis. Pulmonary venous hypertension and cardiomegaly. Electronically Signed   By: Kristine Garbe M.D.   On: 09/28/2017 04:34   Dg Chest 2 View  Result Date: 09/16/2017 CLINICAL DATA:  Anterior chest wall pain for 2 weeks with a nonproductive cough. EXAM: CHEST - 2 VIEW COMPARISON:  None. FINDINGS: The cardiac silhouette is partially obscured though appears borderline enlarged. There is a moderate-sized right pleural effusion with associated compressive atelectasis. Minimal left basilar opacity likely represents atelectasis. The upper lungs are clear bilaterally. No pneumothorax is identified. No acute osseous abnormality is seen. IMPRESSION: Moderate right pleural effusion with right greater than left basilar atelectasis. Electronically Signed   By: Logan Bores M.D.   On: 09/16/2017 15:47   Ct Angio Chest Pe W And/or Wo Contrast  Result Date: 09/19/2017 CLINICAL DATA:  67 year old female with acute chest pain and shortness of breath for 3 weeks. EXAM: CT ANGIOGRAPHY CHEST WITH CONTRAST TECHNIQUE: Multidetector CT imaging of the chest was performed using the standard protocol during bolus administration of intravenous contrast. Multiplanar CT image reconstructions and MIPs were obtained to evaluate the vascular anatomy. CONTRAST:  74m ISOVUE-370 IOPAMIDOL (ISOVUE-370) INJECTION 76%  COMPARISON:  09/19/2017 chest radiograph FINDINGS: Cardiovascular: This is a technically satisfactory study. No pulmonary emboli are identified. Moderate to marked cardiomegaly noted with RIGHT atrial enlargement. Coronary artery and aortic atherosclerotic calcifications identified. No pericardial effusion or thoracic aortic aneurysm. Mediastinum/Nodes: No enlarged mediastinal, hilar, or axillary lymph nodes. Thyroid gland, trachea, and esophagus demonstrate no significant findings. Lungs/Pleura: A moderate RIGHT pleural effusion with RIGHT LOWER lung atelectasis noted. This effusion may be loculated along the RIGHT lung base. No pulmonary nodule, mass or consolidation.  No pneumothorax. Upper Abdomen: Hypodense areas along the liver may represent subcapsular fluid/masses versus parenchymal abnormalities. A small amount of fluid adjacent to the spleen is identified. Musculoskeletal: Multiple lytic lesions  of the visualized bones identified involving the spine, ribs, clavicles and sternum. A 3.5 x 7 cm soft tissue destructive mass involving the anterolateral LEFT 7th rib is noted. Review of the MIP images confirms the above findings. IMPRESSION: 1. Multiple lytic lesions throughout the visualized bones with 3.5 x 7 cm soft tissue mass involving the LEFT 7th rib, worrisome for metastatic disease or myeloma. 2. Moderate RIGHT pleural effusion and RIGHT LOWER lung atelectasis. This effusion may be loculated along the RIGHT lung base. Malignant effusion not excluded given bony findings. 3. Hypodense areas along the liver which may represent subcapsular fluid/masses versus hepatic lesions. Consider dedicated abdominal and pelvic CT with contrast for further evaluation. 4. No evidence of pulmonary emboli 5. Moderate to marked cardiomegaly 6. Coronary artery and Aortic Atherosclerosis (ICD10-I70.0). Electronically Signed   By: Margarette Canada M.D.   On: 09/19/2017 14:38   US Abdomen Complete  Result Date:  09/21/2017 CLINICAL DATA:  67 year old female with a history of acute renal failure. Recent bone survey demonstrates evidence of metastatic disease or myeloma EXAM: ABDOMEN ULTRASOUND COMPLETE COMPARISON:  CT chest 09/19/2017, bone survey 09/19/2017 FINDINGS: Gallbladder: Circumferential gallbladder wall thickening, with no hyperechoic material within the gallbladder lumen. Negative sonographic Murphy's sign. No pericholecystic fluid. Common bile duct: Diameter: 2 mm-3 mm Liver: Increased echogenicity of the liver. The surface does not appear nodular. 1 cm anechoic cystic structure in the left liver with through transmission and no internal complexity. Cystic lesion of the right liver, towards the dome of the liver measures 3.9 cm x 3.9 cm x 4.0 cm. Through transmission with no significant internal complexity. No associated flow. IVC: No abnormality visualized. Pancreas: Visualized portion unremarkable. Spleen: Size and appearance within normal limits. Right Kidney: Length: 14.0 cm. No hydronephrosis. Echogenicity somewhat increased. Several hypoechoic lesions of the right kidney involving the cortex. The largest 2 measure 3.6 cm x 2.7 cm x 3.7 cm and 2.7 cm x 2.5 cm x 2.3 cm. No internal flow and there is posterior Q stick enhancement. The more medial demonstrates a poorly defined margin. Left Kidney: Length: 16.3 cm. No hydronephrosis. Echogenicity similar to the right. Anechoic lesion at the inferior left kidney measures 6.5 cm x 4.7 cm x 5.4 cm. Through transmission with no significant internal complexity. Additional lesion on the left measures 4.8 cm x 5.6 cm x 5.5 cm. Abdominal aorta: No aneurysm visualized. Other findings: None. IMPRESSION: No hydronephrosis of the bilateral kidneys. Echogenicity of the bilateral kidneys is somewhat increased, suggesting chronic medical renal disease. Bilateral kidneys demonstrate cystic lesions. While most likely benign, there is a right-sided lesion with a poorly defined  wall. Further evaluation with contrast-enhanced CT or MR may be useful. Circumferential gallbladder wall thickening with negative sonographic Murphy's sign. This finding may be seen with chronic liver disease or potentially right-sided heart failure. Correlation with presentation and lab values may be useful. The partially visualized lesions of the liver are not well characterized on the current ultrasound. The lesion at the liver dome appears cystic, however, correlation with contrast-enhanced CT or MR is recommended. Electronically Signed   By: Corrie Mckusick D.O.   On: 09/21/2017 07:08   Dg Chest Portable 1 View  Result Date: 09/19/2017 CLINICAL DATA:  Chest pain EXAM: PORTABLE CHEST 1 VIEW COMPARISON:  09/16/2017 FINDINGS: Cardiac shadow remains enlarged. Right basilar atelectasis and associated effusion is again identified stable from the prior exam. Left lung is clear. No bony abnormality is noted. IMPRESSION: Right basilar atelectasis with associated effusions stable  from the previous exam. Electronically Signed   By: Inez Catalina M.D.   On: 09/19/2017 11:43   Dg Bone Survey Met  Result Date: 09/19/2017 CLINICAL DATA:  Lytic lesions shown on prior chest CT EXAM: METASTATIC BONE SURVEY COMPARISON:  Chest CT performed earlier today FINDINGS: Possible small lytic lesions scattered throughout the calvarium. Lytic lesions noted in the mid to distal left clavicle and possible mid right clavicle. Scattered small lytic lesions throughout the humeri bilaterally and within the proximal to mid left radius and ulna. Small lytic lesion within the mid right radius. No focal lytic lesion or acute bony abnormality within the cervical, thoracic or lumbar spine. Mild degenerative facet disease throughout the lumbar spine. Probable small lytic lesions within the proximal to midshaft of the left femur and possibly within the shafts of the left tibia and fibula. Lytic lesions scattered throughout the right tibia. Heart is  borderline in size. Right pleural effusion with right lower lobe atelectasis. Left lung clear. Density projecting over the left lateral lower chest represents the soft tissue mass associated with the left 7th rib anteriorly. IMPRESSION: Numerous small lytic lesions scattered throughout the skull including the calvarium, bilateral clavicles, humeri, radius/ulna bilaterally, left femur, and bilateral tibia/fibula. Findings compatible with metastatic disease or myeloma. The sternum and rib lesions are better seen on chest CT. Soft tissue density over the left lower chest corresponds to the soft tissue mass associated with the anterior left 7th rib. Small to moderate right pleural effusion with right base atelectasis. Electronically Signed   By: Rolm Baptise M.D.   On: 09/19/2017 17:13   Ct Bone Marrow Biopsy & Aspiration  Result Date: 09/23/2017 CLINICAL DATA:  Lytic bone lesions, abnormal serum electrophoresis and clinical suspicion of multiple myeloma. Bone marrow biopsy required for further workup. EXAM: CT GUIDED BONE MARROW ASPIRATION AND BIOPSY ANESTHESIA/SEDATION: Versed 1.0 mg IV, Fentanyl 25 mcg IV Total Moderate Sedation Time:   16 minutes. The patient's level of consciousness and physiologic status were continuously monitored during the procedure by Radiology nursing. PROCEDURE: The procedure risks, benefits, and alternatives were explained to the patient. Questions regarding the procedure were encouraged and answered. The patient understands and consents to the procedure. A time out was performed prior to initiating the procedure. The right gluteal region was prepped with chlorhexidine. Sterile gown and sterile gloves were used for the procedure. Local anesthesia was provided with 1% Lidocaine. Under CT guidance, an 11 gauge On Control bone cutting needle was advanced from a posterior approach into the right iliac bone. Needle positioning was confirmed with CT. Initial non heparinized and heparinized  aspirate samples were obtained of bone marrow. Core biopsy was performed via the On Control drill needle. COMPLICATIONS: None FINDINGS: Inspection of initial aspirate did reveal visible particles. Intact core biopsy sample was obtained. IMPRESSION: CT guided bone marrow biopsy of right posterior iliac bone with both aspirate and core samples obtained. Electronically Signed   By: Aletta Edouard M.D.   On: 09/23/2017 15:32   US Abdomen Limited Ruq  Result Date: 09/28/2017 CLINICAL DATA:  Assess liver and evaluate for ascites. EXAM: ULTRASOUND ABDOMEN LIMITED RIGHT UPPER QUADRANT COMPARISON:  Abdominal ultrasound performed 09/21/2017 FINDINGS: Gallbladder: Gallbladder wall thickening is noted, measuring up to 6 mm. No stones or sludge are seen. No pericholecystic fluid is appreciated. No ultrasonographic Murphy's sign is identified. Common bile duct: Diameter: 0.4 cm, within normal limits in caliber. Liver: Several cysts are noted at the right hepatic dome. Hepatic cysts measure up  to 3.1 cm in size. Within normal limits in parenchymal echogenicity. Portal vein is patent on color Doppler imaging with normal direction of blood flow towards the liver. No definite ascites is noted. IMPRESSION: 1. No acute abnormality seen to explain the patient's symptoms. No ascites seen. 2. Gallbladder wall thickening is nonspecific and may reflect mild chronic inflammation. 3. Hepatic cysts noted. Electronically Signed   By: Garald Balding M.D.   On: 09/28/2017 04:11        This case was discussed with Dr. Julien Nordmann. He expressed agreement with my management of this patient.

## 2017-10-09 NOTE — Progress Notes (Signed)
Spoke w/ pt's son to introduce myself as his Development worker, community and to discuss the [employer].  Pt is not working so her son will provide a letter of support.  Once received I will approve her for the grant and give him an expense sheet.  Pt is already receiving drug replacement for Velcade and assistance for Revlimid.  Per hospital notes pt cannot apply for Medicaid or the [place] because she is not a [place] citizen or [place] resident.  I will give them my card for any questions or concerns she may have in the future.

## 2017-10-09 NOTE — Telephone Encounter (Signed)
Oral Oncology Pharmacist Encounter  Received call from patient's daughter that they have received initial shipment of Revlimid. Revlimid start date: 10/09/2017  Per request, Lasix and warfarin prescriptions E scribed to [facility].  [facility] has been removed from preferred pharmacies in patient's chart.  All questions answered. Ms. Koman voiced understanding and appreciation.  They know to call the office with any additional questions or concerns.  Johny Drilling, PharmD, BCPS, BCOP  10/09/2017 2:32 PM [facility] [phone]

## 2017-10-12 ENCOUNTER — Telehealth: Payer: Self-pay | Admitting: Medical Oncology

## 2017-10-12 NOTE — Telephone Encounter (Signed)
Pt is doing fine after 1st velcade. Started Revlimid Friday pm.

## 2017-10-14 ENCOUNTER — Encounter (HOSPITAL_COMMUNITY): Payer: Self-pay | Admitting: Dentistry

## 2017-10-15 ENCOUNTER — Other Ambulatory Visit: Payer: Self-pay | Admitting: Medical

## 2017-10-15 ENCOUNTER — Telehealth: Payer: Self-pay | Admitting: Medical

## 2017-10-15 ENCOUNTER — Inpatient Hospital Stay: Payer: Self-pay

## 2017-10-15 ENCOUNTER — Inpatient Hospital Stay (HOSPITAL_BASED_OUTPATIENT_CLINIC_OR_DEPARTMENT_OTHER): Payer: Self-pay | Admitting: Medical

## 2017-10-15 ENCOUNTER — Encounter: Payer: Self-pay | Admitting: Internal Medicine

## 2017-10-15 VITALS — BP 146/82 | HR 82 | Temp 99.0°F | Resp 16 | Wt 198.4 lb

## 2017-10-15 DIAGNOSIS — C9000 Multiple myeloma not having achieved remission: Secondary | ICD-10-CM

## 2017-10-15 DIAGNOSIS — I700 Atherosclerosis of aorta: Secondary | ICD-10-CM

## 2017-10-15 DIAGNOSIS — Z5111 Encounter for antineoplastic chemotherapy: Secondary | ICD-10-CM

## 2017-10-15 DIAGNOSIS — J90 Pleural effusion, not elsewhere classified: Secondary | ICD-10-CM

## 2017-10-15 DIAGNOSIS — M5136 Other intervertebral disc degeneration, lumbar region: Secondary | ICD-10-CM

## 2017-10-15 DIAGNOSIS — R634 Abnormal weight loss: Secondary | ICD-10-CM

## 2017-10-15 DIAGNOSIS — Z79899 Other long term (current) drug therapy: Secondary | ICD-10-CM

## 2017-10-15 DIAGNOSIS — R109 Unspecified abdominal pain: Secondary | ICD-10-CM

## 2017-10-15 DIAGNOSIS — R140 Abdominal distension (gaseous): Secondary | ICD-10-CM

## 2017-10-15 DIAGNOSIS — R5383 Other fatigue: Secondary | ICD-10-CM

## 2017-10-15 DIAGNOSIS — L03115 Cellulitis of right lower limb: Secondary | ICD-10-CM

## 2017-10-15 DIAGNOSIS — K7689 Other specified diseases of liver: Secondary | ICD-10-CM

## 2017-10-15 DIAGNOSIS — R600 Localized edema: Secondary | ICD-10-CM

## 2017-10-15 DIAGNOSIS — I872 Venous insufficiency (chronic) (peripheral): Secondary | ICD-10-CM

## 2017-10-15 DIAGNOSIS — Z7901 Long term (current) use of anticoagulants: Secondary | ICD-10-CM

## 2017-10-15 DIAGNOSIS — I517 Cardiomegaly: Secondary | ICD-10-CM

## 2017-10-15 DIAGNOSIS — I119 Hypertensive heart disease without heart failure: Secondary | ICD-10-CM

## 2017-10-15 DIAGNOSIS — E8809 Other disorders of plasma-protein metabolism, not elsewhere classified: Secondary | ICD-10-CM

## 2017-10-15 LAB — CMP (CANCER CENTER ONLY)
ALK PHOS: 98 U/L (ref 38–126)
ALT: 25 U/L (ref 0–44)
AST: 28 U/L (ref 15–41)
Albumin: 2.6 g/dL — ABNORMAL LOW (ref 3.5–5.0)
Anion gap: 12 (ref 5–15)
BILIRUBIN TOTAL: 0.7 mg/dL (ref 0.3–1.2)
BUN: 15 mg/dL (ref 8–23)
CALCIUM: 8.5 mg/dL — AB (ref 8.9–10.3)
CO2: 24 mmol/L (ref 22–32)
Chloride: 104 mmol/L (ref 98–111)
Creatinine: 1.22 mg/dL — ABNORMAL HIGH (ref 0.44–1.00)
GFR, EST NON AFRICAN AMERICAN: 45 mL/min — AB (ref >60)
GFR, Est AFR Am: 52 mL/min — ABNORMAL LOW (ref >60)
Glucose, Bld: 94 mg/dL (ref 70–99)
Potassium: 3.8 mmol/L (ref 3.5–5.1)
Sodium: 140 mmol/L (ref 135–145)
Total Protein: 8.9 g/dL — ABNORMAL HIGH (ref 6.5–8.1)

## 2017-10-15 LAB — CBC WITH DIFFERENTIAL (CANCER CENTER ONLY)
BASOS ABS: 0 10*3/uL (ref 0.0–0.1)
Basophils Relative: 1 %
Eosinophils Absolute: 0.2 10*3/uL (ref 0.0–0.5)
Eosinophils Relative: 4 %
HCT: 28.9 % — ABNORMAL LOW (ref 34.8–46.6)
HEMOGLOBIN: 9.4 g/dL — AB (ref 11.6–15.9)
LYMPHS ABS: 0.8 10*3/uL — AB (ref 0.9–3.3)
LYMPHS PCT: 19 %
MCH: 28.7 pg (ref 25.1–34.0)
MCHC: 32.5 g/dL (ref 31.5–36.0)
MCV: 88.4 fL (ref 79.5–101.0)
Monocytes Absolute: 0.1 10*3/uL (ref 0.1–0.9)
Monocytes Relative: 3 %
NEUTROS PCT: 73 %
Neutro Abs: 3 10*3/uL (ref 1.5–6.5)
Platelet Count: 53 10*3/uL — ABNORMAL LOW (ref 145–400)
RBC: 3.27 MIL/uL — ABNORMAL LOW (ref 3.70–5.45)
RDW: 23 % — ABNORMAL HIGH (ref 11.2–14.5)
WBC Count: 4.1 10*3/uL (ref 3.9–10.3)

## 2017-10-15 MED ORDER — WARFARIN SODIUM 2 MG PO TABS: 2.0000 mg | ORAL_TABLET | Freq: Every day | ORAL | 5 refills | Status: DC

## 2017-10-15 MED ORDER — CEPHALEXIN 500 MG PO CAPS: 500.0000 mg | ORAL_CAPSULE | Freq: Two times a day (BID) | ORAL | 0 refills | Status: DC

## 2017-10-15 MED ORDER — PROCHLORPERAZINE MALEATE 10 MG PO TABS
10.0000 mg | ORAL_TABLET | Freq: Once | ORAL | Status: AC
  Administered 2017-10-15: 10 mg via ORAL

## 2017-10-15 MED ORDER — POLYETHYLENE GLYCOL 3350 17 G PO PACK: 17.0000 g | PACK | Freq: Every day | ORAL | 0 refills | Status: DC

## 2017-10-15 MED ORDER — DEXAMETHASONE 4 MG PO TABS: 40.0000 mg | ORAL_TABLET | ORAL | 3 refills | Status: DC

## 2017-10-15 MED ORDER — FUROSEMIDE 20 MG PO TABS: ORAL_TABLET | ORAL | 2 refills | Status: DC

## 2017-10-15 MED ORDER — BORTEZOMIB CHEMO SQ INJECTION 3.5 MG (2.5MG/ML)
1.3000 mg/m2 | Freq: Once | INTRAMUSCULAR | Status: AC
  Administered 2017-10-15: 2.75 mg via SUBCUTANEOUS
  Filled 2017-10-15: qty 1.1

## 2017-10-15 MED ORDER — ACYCLOVIR 200 MG PO CAPS: 200.0000 mg | ORAL_CAPSULE | Freq: Two times a day (BID) | ORAL | 2 refills | Status: DC

## 2017-10-15 MED ORDER — METOPROLOL TARTRATE 25 MG PO TABS: 12.5000 mg | ORAL_TABLET | Freq: Two times a day (BID) | ORAL | 0 refills | Status: DC

## 2017-10-15 MED ORDER — PROCHLORPERAZINE MALEATE 10 MG PO TABS
ORAL_TABLET | ORAL | Status: AC
  Filled 2017-10-15: qty 1

## 2017-10-15 MED FILL — FUROSEMIDE 20 MG TABS: 20 | 30 days supply | Qty: 30 | Fill #0

## 2017-10-15 MED FILL — CEPHALEXIN 500 MG CAPSULE: 500 | 7 days supply | Qty: 14 | Fill #0

## 2017-10-15 MED FILL — DEXAMETHASONE 4 MG TABLET: 4 | 28 days supply | Qty: 40 | Fill #0

## 2017-10-15 NOTE — Telephone Encounter (Signed)
PT scheduled per 8/22 sch message.  °

## 2017-10-15 NOTE — Progress Notes (Signed)
Per Dr. Julien Nordmann ok to treat today with platelets of 53

## 2017-10-15 NOTE — Patient Instructions (Signed)
[facility] Discharge Instructions for Patients Receiving Chemotherapy  Today you received the following chemotherapy agents Velcade  To help prevent nausea and vomiting after your treatment, we encourage you to take your nausea medication as directed   If you develop nausea and vomiting that is not controlled by your nausea medication, call the clinic.   BELOW ARE SYMPTOMS THAT SHOULD BE REPORTED IMMEDIATELY:  *FEVER GREATER THAN 100.5 F  *CHILLS WITH OR WITHOUT FEVER  NAUSEA AND VOMITING THAT IS NOT CONTROLLED WITH YOUR NAUSEA MEDICATION  *UNUSUAL SHORTNESS OF BREATH  *UNUSUAL BRUISING OR BLEEDING  TENDERNESS IN MOUTH AND THROAT WITH OR WITHOUT PRESENCE OF ULCERS  *URINARY PROBLEMS  *BOWEL PROBLEMS  UNUSUAL RASH Items with * indicate a potential emergency and should be followed up as soon as possible.  Feel free to call the clinic should you have any questions or concerns. The clinic phone number is [phone].  Please show the [facility] at check-in to the Emergency Department and triage nurse.   Bortezomib (velcade) injection What is this medicine? BORTEZOMIB (bor TEZ oh mib) is a medicine that targets proteins in cancer cells and stops the cancer cells from growing. It is used to treat multiple myeloma and mantle-cell lymphoma. This medicine may be used for other purposes; ask your health care provider or pharmacist if you have questions. COMMON BRAND NAME(S): Velcade What should I tell my health care provider before I take this medicine? They need to know if you have any of these conditions: -diabetes -heart disease -irregular heartbeat -liver disease -on hemodialysis -low blood counts, like low white blood cells, platelets, or hemoglobin -peripheral neuropathy -taking medicine for blood pressure -an unusual or allergic reaction to bortezomib, mannitol, boron, other medicines, foods, dyes, or preservatives -pregnant or trying to get  pregnant -breast-feeding How should I use this medicine? This medicine is for injection into a vein or for injection under the skin. It is given by a health care professional in a hospital or clinic setting. Talk to your pediatrician regarding the use of this medicine in children. Special care may be needed. Overdosage: If you think you have taken too much of this medicine contact a poison control center or emergency room at once. NOTE: This medicine is only for you. Do not share this medicine with others. What if I miss a dose? It is important not to miss your dose. Call your doctor or health care professional if you are unable to keep an appointment. What may interact with this medicine? This medicine may interact with the following medications: -ketoconazole -rifampin -ritonavir -St. John's Wort This list may not describe all possible interactions. Give your health care provider a list of all the medicines, herbs, non-prescription drugs, or dietary supplements you use. Also tell them if you smoke, drink alcohol, or use illegal drugs. Some items may interact with your medicine. What should I watch for while using this medicine? You may get drowsy or dizzy. Do not drive, use machinery, or do anything that needs mental alertness until you know how this medicine affects you. Do not stand or sit up quickly, especially if you are an older patient. This reduces the risk of dizzy or fainting spells. In some cases, you may be given additional medicines to help with side effects. Follow all directions for their use. Call your doctor or health care professional for advice if you get a fever, chills or sore throat, or other symptoms of a cold or flu. Do  not treat yourself. This drug decreases your body's ability to fight infections. Try to avoid being around people who are sick. This medicine may increase your risk to bruise or bleed. Call your doctor or health care professional if you notice any unusual  bleeding. You may need blood work done while you are taking this medicine. In some patients, this medicine may cause a serious brain infection that may cause death. If you have any problems seeing, thinking, speaking, walking, or standing, tell your doctor right away. If you cannot reach your doctor, urgently seek other source of medical care. Check with your doctor or health care professional if you get an attack of severe diarrhea, nausea and vomiting, or if you sweat a lot. The loss of too much body fluid can make it dangerous for you to take this medicine. Do not become pregnant while taking this medicine or for at least 2 months after stopping it. Women should inform their doctor if they wish to become pregnant or think they might be pregnant. Men should not father a child while taking this medicine and for at least 2 months after stopping it. There is a potential for serious side effects to an unborn child. Talk to your health care professional or pharmacist for more information. Do not breast-feed an infant while taking this medicine or for 2 months after stopping it. This medicine may interfere with the ability to have a child. You should talk with your doctor or health care professional if you are concerned about your fertility. What side effects may I notice from receiving this medicine? Side effects that you should report to your doctor or health care professional as soon as possible: -allergic reactions like skin rash, itching or hives, swelling of the face, lips, or tongue -breathing problems -changes in hearing -changes in vision -fast, irregular heartbeat -feeling faint or lightheaded, falls -pain, tingling, numbness in the hands or feet -right upper belly pain -seizures -swelling of the ankles, feet, hands -unusual bleeding or bruising -unusually weak or tired -vomiting -yellowing of the eyes or skin Side effects that usually do not require medical attention (report to your  doctor or health care professional if they continue or are bothersome): -changes in emotions or moods -constipation -diarrhea -loss of appetite -headache -irritation at site where injected -nausea This list may not describe all possible side effects. Call your doctor for medical advice about side effects. You may report side effects to FDA at 1-800-FDA-1088. Where should I keep my medicine? This drug is given in a hospital or clinic and will not be stored at home. NOTE: This sheet is a summary. It may not cover all possible information. If you have questions about this medicine, talk to your doctor, pharmacist, or health care provider.  2018 Elsevier/Gold Standard (2016-01-10 15:53:51)   Edema Edema is when you have too much fluid in your body or under your skin. Edema may make your legs, feet, and ankles swell up. Swelling is also common in looser tissues, like around your eyes. This is a common condition. It gets more common as you get older. There are many possible causes of edema. Eating too much salt (sodium) and being on your feet or sitting for a long time can cause edema in your legs, feet, and ankles. Hot weather may make edema worse. Edema is usually painless. Your skin may look swollen or shiny. Follow these instructions at home:  Keep the swollen body part raised (elevated) above the level of your heart  when you are sitting or lying down.  Do not sit still or stand for a long time.  Do not wear tight clothes. Do not wear garters on your upper legs.  Exercise your legs. This can help the swelling go down.  Wear elastic bandages or support stockings as told by your doctor.  Eat a low-salt (low-sodium) diet to reduce fluid as told by your doctor.  Depending on the cause of your swelling, you may need to limit how much fluid you drink (fluid restriction).  Take over-the-counter and prescription medicines only as told by your doctor. Contact a doctor if:  Treatment is not  working.  You have heart, liver, or kidney disease and have symptoms of edema.  You have sudden and unexplained weight gain. Get help right away if:  You have shortness of breath or chest pain.  You cannot breathe when you lie down.  You have pain, redness, or warmth in the swollen areas.  You have heart, liver, or kidney disease and get edema all of a sudden.  You have a fever and your symptoms get worse all of a sudden. Summary  Edema is when you have too much fluid in your body or under your skin.  Edema may make your legs, feet, and ankles swell up. Swelling is also common in looser tissues, like around your eyes.  Raise (elevate) the swollen body part above the level of your heart when you are sitting or lying down.  Follow your doctor's instructions about diet and how much fluid you can drink (fluid restriction). This information is not intended to replace advice given to you by your health care provider. Make sure you discuss any questions you have with your health care provider. Document Released: 07/30/2007 Document Revised: 02/29/2016 Document Reviewed: 02/29/2016 Elsevier Interactive Patient Education  2017 [employer].

## 2017-10-15 NOTE — Progress Notes (Signed)
Pt is approved for the $400 [facility] grant.  °

## 2017-10-16 NOTE — Progress Notes (Signed)
[facility] Progress Note   Debbie Chan [id number] February 09, 1951 67 y.o.  Debbie Chan is managed by Fanny Bien. Mohamed  Actively treated with chemotherapy/immunotherapy: yes  Current Therapy: Velcade and Revlimid    Last Treated: 10/15/2017 (cycle 1 , day 8)  Assessment: Plan:    Bilateral lower extremity edema  Venous stasis dermatitis of right lower extremity  Cellulitis of right lower extremity   Bilateral lower extremity edema: Debbie Chan was seen for bilateral lower extremity edema on 10/08/2017 but never picked up the prescription that she was given for Lasix as she was concerned about it's impact on her kidneys. A new prescription was sent to the pharmacy at [facility] with instructions that she should use Lasix 20 mg once daily for 2 to 3 days, then stop. She can repeat this schedule after 7 days in her edema recurs.  Venous stasis dermatitis versus cellulitis of the right lower extremity: There is mild erythema and a slight increase in warmth of the right anterior lower extremity.  Mrs. Deitrick was given a prescription for Keflex 500 mg PO BID x 7 days.   Please see After Visit Summary for patient specific instructions.  Future Appointments  Date Time Provider [place]  10/22/2017  7:45 AM [facility] LAB 5 [facility] None  10/22/2017  8:45 AM [facility] INFUSION [facility] None  10/29/2017  1:30 PM [facility] LAB 5 [facility] None  10/29/2017  2:00 PM Curcio, Kristin R, NP [facility] None  10/29/2017  3:00 PM [facility] INFUSION [facility] None  11/05/2017  8:45 AM [facility] LAB 4 [facility] None  11/05/2017  9:45 AM [facility] INFUSION [facility] None  11/12/2017  2:30 PM [facility] LAB 4 [facility] None  11/12/2017  3:00 PM Curcio, Roselie Awkward, NP [facility] None  11/12/2017  3:30 PM [facility] INFUSION [facility] None    No orders of the defined types were placed in this encounter.      Subjective:   Patient ID:  Debbie Chan  is a 67 y.o. (DOB 1950/02/27) female.  Chief Complaint: No chief complaint on file.   HPI Annamay Laymon is a 67 year old female with a diagnosis of multiple myeloma who is managed by Dr. Fanny Bien. Mohamed and is receiving cycle 1, day 8 of Velcade today.  She has begun Revlimid. Debbie Chan was seen for bilateral lower extremity edema on 10/08/2017 but never picked up the prescription that she was given for Lasix as she was concerned about it's impact on her kidneys. She continues to have bilateral lower extremity edema. She has also noted mild erythema and a slight increase in warmth of the right anterior lower extremity.  Debbie Chan denies fever, chills, or sweats.  Medications: I have reviewed the patient's current medications.  Allergies: No Known Allergies  No past medical history on file.  No past surgical history on file.  No family history on file.  Social History   Socioeconomic History  . Marital status: Widowed    Spouse name: Not on file  . Number of children: 4  . Years of education: Not on file  . Highest education level: Not on file  Occupational History  . Not on file  Social Needs  . Financial resource strain: Not on file  . Food insecurity:    Worry: Not on file    Inability: Not on file  . Transportation needs:    Medical: Not on file    Non-medical: Not on file  Tobacco Use  . Smoking status: Never Smoker  .  Smokeless tobacco: Never Used  Substance and Sexual Activity  . Alcohol use: Never    Frequency: Never  . Drug use: Never  . Sexual activity: Not on file  Lifestyle  . Physical activity:    Days per week: Not on file    Minutes per session: Not on file  . Stress: Not on file  Relationships  . Social connections:    Talks on phone: Not on file    Gets together: Not on file    Attends religious service: Not on file    Active member of club or organization: Not on file    Attends meetings of clubs or organizations: Not on file     Relationship status: Not on file  . Intimate partner violence:    Fear of current or ex partner: Not on file    Emotionally abused: Not on file    Physically abused: Not on file    Forced sexual activity: Not on file  Other Topics Concern  . Not on file  Social History Narrative  . Not on file    Past Medical History, Surgical history, Social history, and Family history were reviewed and updated as appropriate.   Please see review of systems for further details on the patient's review from today.   Review of Systems:  Review of Systems  Constitutional: Negative for activity change, chills, diaphoresis and fever.  Cardiovascular: Positive for leg swelling.  Skin: Positive for color change. Negative for rash and wound.    Objective:   Physical Exam:  There were no vitals taken for this visit. ECOG: 1  Physical Exam  Constitutional: No distress.  HENT:  Head: Normocephalic and atraumatic.  Cardiovascular: Normal rate, regular rhythm and normal heart sounds. Exam reveals no gallop and no friction rub.  No murmur heard. Pulmonary/Chest: Effort normal and breath sounds normal. No stridor. No respiratory distress. She has no wheezes. She has no rales.  Musculoskeletal: She exhibits edema (Trace bilateral lower extremity edema to the knees bilaterally.).  Skin: She is not diaphoretic. There is erythema (Diffuse erythema of the anterior lower extremity with a slight increase in warmth.).    Lab Review:     Component Value Date/Time   NA 140 10/15/2017 0819   K 3.8 10/15/2017 0819   CL 104 10/15/2017 0819   CO2 24 10/15/2017 0819   GLUCOSE 94 10/15/2017 0819   BUN 15 10/15/2017 0819   CREATININE 1.22 (H) 10/15/2017 0819   CALCIUM 8.5 (L) 10/15/2017 0819   PROT 8.9 (H) 10/15/2017 0819   ALBUMIN 2.6 (L) 10/15/2017 0819   AST 28 10/15/2017 0819   ALT 25 10/15/2017 0819   ALKPHOS 98 10/15/2017 0819   BILITOT 0.7 10/15/2017 0819   GFRNONAA 45 (L) 10/15/2017 0819   GFRAA 52  (L) 10/15/2017 0819       Component Value Date/Time   WBC 4.1 10/15/2017 0819   WBC 11.8 (H) 09/27/2017 0215   RBC 3.27 (L) 10/15/2017 0819   HGB 9.4 (L) 10/15/2017 0819   HCT 28.9 (L) 10/15/2017 0819   PLT 53 (L) 10/15/2017 0819   MCV 88.4 10/15/2017 0819   MCH 28.7 10/15/2017 0819   MCHC 32.5 10/15/2017 0819   RDW 23.0 (H) 10/15/2017 0819   LYMPHSABS 0.8 (L) 10/15/2017 0819   MONOABS 0.1 10/15/2017 0819   EOSABS 0.2 10/15/2017 0819   BASOSABS 0.0 10/15/2017 0819   -------------------------------  Imaging from last 24 hours (if applicable):  Radiology interpretation: Dg Chest 1  View  Result Date: 09/23/2017 CLINICAL DATA:  Dyspnea. EXAM: CHEST  1 VIEW COMPARISON:  Chest CT 09/19/2017, chest radiograph 09/19/2017 FINDINGS: The cardiac silhouette is enlarged. Calcific atherosclerotic disease of the aorta. No evidence of pneumothorax. Layering right pleural effusion. Right lower and middle lobe peribronchial airspace disease versus atelectasis. Possible small left pleural effusion versus pleural thickening. Osseous structures are without acute abnormality. Soft tissues are grossly normal. IMPRESSION: Layering right pleural effusion with right lower and middle lobe peribronchial airspace disease versus atelectasis. Enlarged cardiac silhouette. Possible small left pleural effusion versus left pleural thickening. Electronically Signed   By: Fidela Salisbury M.D.   On: 09/23/2017 13:24   Dg Chest 2 View  Result Date: 09/28/2017 CLINICAL DATA:  67 y/o  F; ascites. EXAM: CHEST - 2 VIEW COMPARISON:  09/23/2017 chest radiograph. FINDINGS: Stable cardiomegaly given projection and technique. Pulmonary venous hypertension. Increased small right and moderate left pleural effusions with associated basilar atelectasis. No acute osseous abnormality is evident. IMPRESSION: Increased small right and moderate left pleural effusions with associated basilar atelectasis. Pulmonary venous hypertension and  cardiomegaly. Electronically Signed   By: Kristine Garbe M.D.   On: 09/28/2017 04:34   Dg Chest 2 View  Result Date: 09/16/2017 CLINICAL DATA:  Anterior chest wall pain for 2 weeks with a nonproductive cough. EXAM: CHEST - 2 VIEW COMPARISON:  None. FINDINGS: The cardiac silhouette is partially obscured though appears borderline enlarged. There is a moderate-sized right pleural effusion with associated compressive atelectasis. Minimal left basilar opacity likely represents atelectasis. The upper lungs are clear bilaterally. No pneumothorax is identified. No acute osseous abnormality is seen. IMPRESSION: Moderate right pleural effusion with right greater than left basilar atelectasis. Electronically Signed   By: Logan Bores M.D.   On: 09/16/2017 15:47   Ct Angio Chest Pe W And/or Wo Contrast  Result Date: 09/19/2017 CLINICAL DATA:  67 year old female with acute chest pain and shortness of breath for 3 weeks. EXAM: CT ANGIOGRAPHY CHEST WITH CONTRAST TECHNIQUE: Multidetector CT imaging of the chest was performed using the standard protocol during bolus administration of intravenous contrast. Multiplanar CT image reconstructions and MIPs were obtained to evaluate the vascular anatomy. CONTRAST:  25m ISOVUE-370 IOPAMIDOL (ISOVUE-370) INJECTION 76% COMPARISON:  09/19/2017 chest radiograph FINDINGS: Cardiovascular: This is a technically satisfactory study. No pulmonary emboli are identified. Moderate to marked cardiomegaly noted with RIGHT atrial enlargement. Coronary artery and aortic atherosclerotic calcifications identified. No pericardial effusion or thoracic aortic aneurysm. Mediastinum/Nodes: No enlarged mediastinal, hilar, or axillary lymph nodes. Thyroid gland, trachea, and esophagus demonstrate no significant findings. Lungs/Pleura: A moderate RIGHT pleural effusion with RIGHT LOWER lung atelectasis noted. This effusion may be loculated along the RIGHT lung base. No pulmonary nodule, mass or  consolidation.  No pneumothorax. Upper Abdomen: Hypodense areas along the liver may represent subcapsular fluid/masses versus parenchymal abnormalities. A small amount of fluid adjacent to the spleen is identified. Musculoskeletal: Multiple lytic lesions of the visualized bones identified involving the spine, ribs, clavicles and sternum. A 3.5 x 7 cm soft tissue destructive mass involving the anterolateral LEFT 7th rib is noted. Review of the MIP images confirms the above findings. IMPRESSION: 1. Multiple lytic lesions throughout the visualized bones with 3.5 x 7 cm soft tissue mass involving the LEFT 7th rib, worrisome for metastatic disease or myeloma. 2. Moderate RIGHT pleural effusion and RIGHT LOWER lung atelectasis. This effusion may be loculated along the RIGHT lung base. Malignant effusion not excluded given bony findings. 3. Hypodense areas along the liver  which may represent subcapsular fluid/masses versus hepatic lesions. Consider dedicated abdominal and pelvic CT with contrast for further evaluation. 4. No evidence of pulmonary emboli 5. Moderate to marked cardiomegaly 6. Coronary artery and Aortic Atherosclerosis (ICD10-I70.0). Electronically Signed   By: Margarette Canada M.D.   On: 09/19/2017 14:38   US Abdomen Complete  Result Date: 09/21/2017 CLINICAL DATA:  67 year old female with a history of acute renal failure. Recent bone survey demonstrates evidence of metastatic disease or myeloma EXAM: ABDOMEN ULTRASOUND COMPLETE COMPARISON:  CT chest 09/19/2017, bone survey 09/19/2017 FINDINGS: Gallbladder: Circumferential gallbladder wall thickening, with no hyperechoic material within the gallbladder lumen. Negative sonographic Murphy's sign. No pericholecystic fluid. Common bile duct: Diameter: 2 mm-3 mm Liver: Increased echogenicity of the liver. The surface does not appear nodular. 1 cm anechoic cystic structure in the left liver with through transmission and no internal complexity. Cystic lesion of the  right liver, towards the dome of the liver measures 3.9 cm x 3.9 cm x 4.0 cm. Through transmission with no significant internal complexity. No associated flow. IVC: No abnormality visualized. Pancreas: Visualized portion unremarkable. Spleen: Size and appearance within normal limits. Right Kidney: Length: 14.0 cm. No hydronephrosis. Echogenicity somewhat increased. Several hypoechoic lesions of the right kidney involving the cortex. The largest 2 measure 3.6 cm x 2.7 cm x 3.7 cm and 2.7 cm x 2.5 cm x 2.3 cm. No internal flow and there is posterior Q stick enhancement. The more medial demonstrates a poorly defined margin. Left Kidney: Length: 16.3 cm. No hydronephrosis. Echogenicity similar to the right. Anechoic lesion at the inferior left kidney measures 6.5 cm x 4.7 cm x 5.4 cm. Through transmission with no significant internal complexity. Additional lesion on the left measures 4.8 cm x 5.6 cm x 5.5 cm. Abdominal aorta: No aneurysm visualized. Other findings: None. IMPRESSION: No hydronephrosis of the bilateral kidneys. Echogenicity of the bilateral kidneys is somewhat increased, suggesting chronic medical renal disease. Bilateral kidneys demonstrate cystic lesions. While most likely benign, there is a right-sided lesion with a poorly defined wall. Further evaluation with contrast-enhanced CT or MR may be useful. Circumferential gallbladder wall thickening with negative sonographic Murphy's sign. This finding may be seen with chronic liver disease or potentially right-sided heart failure. Correlation with presentation and lab values may be useful. The partially visualized lesions of the liver are not well characterized on the current ultrasound. The lesion at the liver dome appears cystic, however, correlation with contrast-enhanced CT or MR is recommended. Electronically Signed   By: Corrie Mckusick D.O.   On: 09/21/2017 07:08   Dg Chest Portable 1 View  Result Date: 09/19/2017 CLINICAL DATA:  Chest pain EXAM:  PORTABLE CHEST 1 VIEW COMPARISON:  09/16/2017 FINDINGS: Cardiac shadow remains enlarged. Right basilar atelectasis and associated effusion is again identified stable from the prior exam. Left lung is clear. No bony abnormality is noted. IMPRESSION: Right basilar atelectasis with associated effusions stable from the previous exam. Electronically Signed   By: Inez Catalina M.D.   On: 09/19/2017 11:43   Dg Bone Survey Met  Result Date: 09/19/2017 CLINICAL DATA:  Lytic lesions shown on prior chest CT EXAM: METASTATIC BONE SURVEY COMPARISON:  Chest CT performed earlier today FINDINGS: Possible small lytic lesions scattered throughout the calvarium. Lytic lesions noted in the mid to distal left clavicle and possible mid right clavicle. Scattered small lytic lesions throughout the humeri bilaterally and within the proximal to mid left radius and ulna. Small lytic lesion within the mid right radius.  No focal lytic lesion or acute bony abnormality within the cervical, thoracic or lumbar spine. Mild degenerative facet disease throughout the lumbar spine. Probable small lytic lesions within the proximal to midshaft of the left femur and possibly within the shafts of the left tibia and fibula. Lytic lesions scattered throughout the right tibia. Heart is borderline in size. Right pleural effusion with right lower lobe atelectasis. Left lung clear. Density projecting over the left lateral lower chest represents the soft tissue mass associated with the left 7th rib anteriorly. IMPRESSION: Numerous small lytic lesions scattered throughout the skull including the calvarium, bilateral clavicles, humeri, radius/ulna bilaterally, left femur, and bilateral tibia/fibula. Findings compatible with metastatic disease or myeloma. The sternum and rib lesions are better seen on chest CT. Soft tissue density over the left lower chest corresponds to the soft tissue mass associated with the anterior left 7th rib. Small to moderate right  pleural effusion with right base atelectasis. Electronically Signed   By: Rolm Baptise M.D.   On: 09/19/2017 17:13   Ct Bone Marrow Biopsy & Aspiration  Result Date: 09/23/2017 CLINICAL DATA:  Lytic bone lesions, abnormal serum electrophoresis and clinical suspicion of multiple myeloma. Bone marrow biopsy required for further workup. EXAM: CT GUIDED BONE MARROW ASPIRATION AND BIOPSY ANESTHESIA/SEDATION: Versed 1.0 mg IV, Fentanyl 25 mcg IV Total Moderate Sedation Time:   16 minutes. The patient's level of consciousness and physiologic status were continuously monitored during the procedure by Radiology nursing. PROCEDURE: The procedure risks, benefits, and alternatives were explained to the patient. Questions regarding the procedure were encouraged and answered. The patient understands and consents to the procedure. A time out was performed prior to initiating the procedure. The right gluteal region was prepped with chlorhexidine. Sterile gown and sterile gloves were used for the procedure. Local anesthesia was provided with 1% Lidocaine. Under CT guidance, an 11 gauge On Control bone cutting needle was advanced from a posterior approach into the right iliac bone. Needle positioning was confirmed with CT. Initial non heparinized and heparinized aspirate samples were obtained of bone marrow. Core biopsy was performed via the On Control drill needle. COMPLICATIONS: None FINDINGS: Inspection of initial aspirate did reveal visible particles. Intact core biopsy sample was obtained. IMPRESSION: CT guided bone marrow biopsy of right posterior iliac bone with both aspirate and core samples obtained. Electronically Signed   By: Aletta Edouard M.D.   On: 09/23/2017 15:32   US Abdomen Limited Ruq  Result Date: 09/28/2017 CLINICAL DATA:  Assess liver and evaluate for ascites. EXAM: ULTRASOUND ABDOMEN LIMITED RIGHT UPPER QUADRANT COMPARISON:  Abdominal ultrasound performed 09/21/2017 FINDINGS: Gallbladder: Gallbladder  wall thickening is noted, measuring up to 6 mm. No stones or sludge are seen. No pericholecystic fluid is appreciated. No ultrasonographic Murphy's sign is identified. Common bile duct: Diameter: 0.4 cm, within normal limits in caliber. Liver: Several cysts are noted at the right hepatic dome. Hepatic cysts measure up to 3.1 cm in size. Within normal limits in parenchymal echogenicity. Portal vein is patent on color Doppler imaging with normal direction of blood flow towards the liver. No definite ascites is noted. IMPRESSION: 1. No acute abnormality seen to explain the patient's symptoms. No ascites seen. 2. Gallbladder wall thickening is nonspecific and may reflect mild chronic inflammation. 3. Hepatic cysts noted. Electronically Signed   By: Garald Balding M.D.   On: 09/28/2017 04:11

## 2017-10-21 ENCOUNTER — Telehealth: Payer: Self-pay | Admitting: Medical Oncology

## 2017-10-21 ENCOUNTER — Other Ambulatory Visit: Payer: Self-pay | Admitting: Medical Oncology

## 2017-10-21 NOTE — Telephone Encounter (Signed)
Per Dr Julien Nordmann it is okay to treat pt with velcade on 10/22/17 and platelets >/=50,000.

## 2017-10-22 ENCOUNTER — Inpatient Hospital Stay (HOSPITAL_BASED_OUTPATIENT_CLINIC_OR_DEPARTMENT_OTHER): Payer: Self-pay | Admitting: Medical

## 2017-10-22 ENCOUNTER — Inpatient Hospital Stay: Payer: Self-pay

## 2017-10-22 ENCOUNTER — Other Ambulatory Visit: Payer: Self-pay | Admitting: Medical

## 2017-10-22 VITALS — BP 118/56 | HR 84 | Temp 99.6°F | Resp 18

## 2017-10-22 DIAGNOSIS — I872 Venous insufficiency (chronic) (peripheral): Secondary | ICD-10-CM

## 2017-10-22 DIAGNOSIS — C9000 Multiple myeloma not having achieved remission: Secondary | ICD-10-CM

## 2017-10-22 DIAGNOSIS — L03115 Cellulitis of right lower limb: Secondary | ICD-10-CM

## 2017-10-22 DIAGNOSIS — L270 Generalized skin eruption due to drugs and medicaments taken internally: Secondary | ICD-10-CM

## 2017-10-22 DIAGNOSIS — Z79899 Other long term (current) drug therapy: Secondary | ICD-10-CM

## 2017-10-22 DIAGNOSIS — R6883 Chills (without fever): Secondary | ICD-10-CM

## 2017-10-22 DIAGNOSIS — R109 Unspecified abdominal pain: Secondary | ICD-10-CM

## 2017-10-22 DIAGNOSIS — L98491 Non-pressure chronic ulcer of skin of other sites limited to breakdown of skin: Secondary | ICD-10-CM

## 2017-10-22 LAB — CBC WITH DIFFERENTIAL (CANCER CENTER ONLY)
BASOS ABS: 0 10*3/uL (ref 0.0–0.1)
Basophils Relative: 0 %
EOS ABS: 0.9 10*3/uL — AB (ref 0.0–0.5)
Eosinophils Relative: 26 %
HCT: 28.7 % — ABNORMAL LOW (ref 34.8–46.6)
Hemoglobin: 9.4 g/dL — ABNORMAL LOW (ref 11.6–15.9)
LYMPHS PCT: 10 %
Lymphs Abs: 0.3 10*3/uL — ABNORMAL LOW (ref 0.9–3.3)
MCH: 28.8 pg (ref 25.1–34.0)
MCHC: 32.6 g/dL (ref 31.5–36.0)
MCV: 88.3 fL (ref 79.5–101.0)
MONO ABS: 0.2 10*3/uL (ref 0.1–0.9)
Monocytes Relative: 6 %
Neutro Abs: 2 10*3/uL (ref 1.5–6.5)
Neutrophils Relative %: 58 %
Platelet Count: 81 10*3/uL — ABNORMAL LOW (ref 145–400)
RBC: 3.26 MIL/uL — ABNORMAL LOW (ref 3.70–5.45)
RDW: 24 % — AB (ref 11.2–14.5)
WBC Count: 3.4 10*3/uL — ABNORMAL LOW (ref 3.9–10.3)

## 2017-10-22 LAB — URINALYSIS, COMPLETE (UACMP) WITH MICROSCOPIC
BACTERIA UA: NONE SEEN
Bilirubin Urine: NEGATIVE
Glucose, UA: NEGATIVE mg/dL
Ketones, ur: NEGATIVE mg/dL
Nitrite: NEGATIVE
Protein, ur: NEGATIVE mg/dL
Specific Gravity, Urine: 1.006 (ref 1.005–1.030)
pH: 7 (ref 5.0–8.0)

## 2017-10-22 LAB — CMP (CANCER CENTER ONLY)
ALT: 13 U/L (ref 0–44)
AST: 22 U/L (ref 15–41)
Albumin: 2.6 g/dL — ABNORMAL LOW (ref 3.5–5.0)
Alkaline Phosphatase: 92 U/L (ref 38–126)
Anion gap: 12 (ref 5–15)
BUN: 15 mg/dL (ref 8–23)
CHLORIDE: 102 mmol/L (ref 98–111)
CO2: 23 mmol/L (ref 22–32)
Calcium: 8.2 mg/dL — ABNORMAL LOW (ref 8.9–10.3)
Creatinine: 1.21 mg/dL — ABNORMAL HIGH (ref 0.44–1.00)
GFR, EST AFRICAN AMERICAN: 52 mL/min — AB (ref >60)
GFR, EST NON AFRICAN AMERICAN: 45 mL/min — AB (ref >60)
Glucose, Bld: 120 mg/dL — ABNORMAL HIGH (ref 70–99)
POTASSIUM: 3.8 mmol/L (ref 3.5–5.1)
SODIUM: 137 mmol/L (ref 135–145)
Total Bilirubin: 0.7 mg/dL (ref 0.3–1.2)
Total Protein: 7.6 g/dL (ref 6.5–8.1)

## 2017-10-22 LAB — PROTIME-INR
INR: 1.31
Prothrombin Time: 16.2 seconds — ABNORMAL HIGH (ref 11.4–15.2)

## 2017-10-22 MED ORDER — VANCOMYCIN HCL 10 G IV SOLR: 1500.0000 mg | Freq: Once | INTRAVENOUS | Status: DC

## 2017-10-22 MED ORDER — VANCOMYCIN HCL 1.5 G IV SOLR
Freq: Once | INTRAVENOUS | Status: AC
  Administered 2017-10-22: 13:00:00 via INTRAVENOUS
  Filled 2017-10-22: qty 1500

## 2017-10-22 MED ORDER — DOXYCYCLINE HYCLATE 100 MG PO TABS: 100.0000 mg | ORAL_TABLET | Freq: Two times a day (BID) | ORAL | 0 refills | Status: DC

## 2017-10-22 MED FILL — DOXYCYCLINE HYCLATE 100 MG: 100 | 7 days supply | Qty: 14 | Fill #0

## 2017-10-22 MED FILL — METOPROLOL TARTRATE 25 MG T: 25 | 30 days supply | Qty: 30 | Fill #0

## 2017-10-22 MED FILL — Vancomycin HCl For IV Soln 10 GM (Base Equivalent): INTRAVENOUS | Qty: 1500 | Status: AC

## 2017-10-22 MED FILL — Sodium Chloride IV Soln 0.9%: INTRAVENOUS | Qty: 500 | Status: AC

## 2017-10-22 NOTE — Patient Instructions (Signed)
Vancomycin injection What is this medicine? VANCOMYCIN (van koe MYE sin) is a glycopeptide antibiotic. It is used to treat certain kinds of bacterial infections. It will not work for colds, flu, or other viral infections. This medicine may be used for other purposes; ask your health care provider or pharmacist if you have questions. COMMON BRAND NAME(S): Vancocin What should I tell my health care provider before I take this medicine? They need to know if you have any of these conditions: -dehydration -hearing loss -kidney disease -other chronic illness -an unusual or allergic reaction to vancomycin, other medicines, foods, dyes, or preservatives -pregnant or trying to get pregnant -breast-feeding How should I use this medicine? This medicine is infused into a vein. It is usually given by a health care provider in a hospital or clinic. If you receive this medicine at home, you will receive special instructions. Take your medicine at regular intervals. Do not take your medicine more often than directed. Take all of your medicine as directed even if you think you are better. Do not skip doses or stop your medicine early. It is important that you put your used needles and syringes in a special sharps container. Do not put them in a trash can. If you do not have a sharps container, call your pharmacist or healthcare provider to get one. Talk to your pediatrician regarding the use of this medicine in children. While this drug may be prescribed for even very young infants for selected conditions, precautions do apply. Overdosage: If you think you have taken too much of this medicine contact a poison control center or emergency room at once. NOTE: This medicine is only for you. Do not share this medicine with others. What if I miss a dose? If you miss a dose, take it as soon as you can. If it is almost time for your next dose, take only that dose. Do not take double or extra doses. What may interact  with this medicine? -amphotericin B -anesthetics -bacitracin -birth control pills -cisplatin -colistin -diuretics -other aminoglycoside antibiotics -polymyxin B This list may not describe all possible interactions. Give your health care provider a list of all the medicines, herbs, non-prescription drugs, or dietary supplements you use. Also tell them if you smoke, drink alcohol, or use illegal drugs. Some items may interact with your medicine. What should I watch for while using this medicine? Tell your doctor or health care professional if your symptoms do not improve or if you get new symptoms. Your condition and lab work will be monitored while you are taking this medicine. Do not treat diarrhea with over the counter products. Contact your doctor if you have diarrhea that lasts more than 2 days or if it is severe and watery. What side effects may I notice from receiving this medicine? Side effects that you should report to your doctor or health care professional as soon as possible: -allergic reactions like skin rash, itching or hives, swelling of the face, lips, or tongue -breathing difficulty, wheezing -change in amount, color of urine -change in hearing -chest pain -dizziness -fever, chills -flushing of the face and neck (reddening) -low blood pressure -redness, blistering, peeling or loosening of the skin, including inside the mouth -unusual bleeding or bruising -unusually weak or tired Side effects that usually do not require medical attention (report to your doctor or health care professional if they continue or are bothersome): -nausea, vomiting -pain, swelling where injected -stomach cramps This list may not describe all possible side effects.   Call your doctor for medical advice about side effects. You may report side effects to FDA at 1-800-FDA-1088. Where should I keep my medicine? Keep out of the reach of children. You will be instructed on how to store this medicine,  if needed. Throw away any unused medicine after the expiration date on the label. NOTE: This sheet is a summary. It may not cover all possible information. If you have questions about this medicine, talk to your doctor, pharmacist, or health care provider.  2018 Elsevier/Gold Standard (2012-09-17 14:46:02)  

## 2017-10-22 NOTE — Progress Notes (Signed)
[facility] Progress Note   Chavie Kolinski [id number] July 30, 1950 67 y.o.  Emberleigh Reily is managed by Fanny Bien. Mohamed  Actively treated with chemotherapy/immunotherapy: yes  Current Therapy: Velcade  Last Treated: 10/15/2017 (cycle 1, day 8)  Assessment: Plan:    Cellulitis of right lower extremity - Plan: doxycycline (VIBRA-TABS) 100 MG tablet  Drug rash  Skin ulcer of abdomen, limited to breakdown of skin ([place])   Cellulitis of the right lower extremity: The patient is completed Keflex which was given to her 1 week ago.  Despite this she has had progression of cellulitis proximally over her anterior right lower extremity.  Blood culture, urine, and urine culture were ordered today.  She was given vancomycin 1500 mg IV x1 and doxycycline 100 mg p.o. twice daily x7 days.  The patient was seen by Dr. Dominica Severin B. Sherrill and was seen by Dr. Guinevere Ferrari per the request of Dr. Benay Spice.  Drug rash: The patient has a diffuse drug rash over her chest and back.  Dr. Dominica Severin B. Sherrill and Dr. Alen Blew recommended holding Velcade for now and Revlimid.  Please see After Visit Summary for patient specific instructions.  Future Appointments  Date Time Provider [place]  10/29/2017  1:30 PM [facility] LAB 5 [facility] None  10/29/2017  2:00 PM Curcio, Roselie Awkward, NP [facility] None  10/29/2017  3:00 PM [facility] INFUSION [facility] None  11/05/2017  8:45 AM [facility] LAB 4 [facility] None  11/05/2017  9:45 AM [facility] INFUSION [facility] None  11/12/2017  2:30 PM [facility] LAB 4 [facility] None  11/12/2017  3:00 PM Curcio, Roselie Awkward, NP [facility] None  11/12/2017  3:30 PM [facility] INFUSION [facility] None    No orders of the defined types were placed in this encounter.      Subjective:   Patient ID:  Aleese Kamps is a 67 y.o. (DOB 1950/04/15) female.  Chief Complaint: No chief complaint on file.   HPI Meiya Wisler  is a 67 year old female who is  managed by Dr. Fanny Bien. Mohamed for her multiple myeloma. She was seen in infusion as she presented for cycle 1, day 15 of Velcade. She completed her first 14/21 day cycle of Revlimid today according to her daughter. She continues on  Coumadin 2 mg once daily. Mrs. Thoen was seen for bilateral lower extremity edema on 10/08/2017 but never picked up the prescription that she was given for Lasix as she was concerned about it's impact on her kidneys. She was seen on 10/15/2017 and continued to have bilateral lower extremity edema. She had developed mild erythema and a slight increased warmth of the right anterior lower extremity.  Mrs. Yo was given a prescription for Keflex 500 mg p.o. twice daily at that time.  Despite this, she has progression of erythema over her right anterior lower extremity with extension to her right proximal medial thigh.   She also has an area of a shallow ulceration of the right abdomen.  She is also noted to have a diffuse erythematous slightly raised rash over her chest and back.  She reports that she has had chills.  She denies fever or sweats.  Medications: I have reviewed the patient's current medications.  Allergies: No Known Allergies  No past medical history on file.  No past surgical history on file.  No family history on file.  Social History   Socioeconomic History  . Marital status: Widowed    Spouse name: Not on file  . Number of children: 4  .  Years of education: Not on file  . Highest education level: Not on file  Occupational History  . Not on file  Social Needs  . Financial resource strain: Not on file  . Food insecurity:    Worry: Not on file    Inability: Not on file  . Transportation needs:    Medical: Not on file    Non-medical: Not on file  Tobacco Use  . Smoking status: Never Smoker  . Smokeless tobacco: Never Used  Substance and Sexual Activity  . Alcohol use: Never    Frequency: Never  . Drug use: Never  . Sexual activity:  Not on file  Lifestyle  . Physical activity:    Days per week: Not on file    Minutes per session: Not on file  . Stress: Not on file  Relationships  . Social connections:    Talks on phone: Not on file    Gets together: Not on file    Attends religious service: Not on file    Active member of club or organization: Not on file    Attends meetings of clubs or organizations: Not on file    Relationship status: Not on file  . Intimate partner violence:    Fear of current or ex partner: Not on file    Emotionally abused: Not on file    Physically abused: Not on file    Forced sexual activity: Not on file  Other Topics Concern  . Not on file  Social History Narrative  . Not on file    Past Medical History, Surgical history, Social history, and Family history were reviewed and updated as appropriate.   Please see review of systems for further details on the patient's review from today.   Review of Systems:  Review of Systems  Constitutional: Negative for chills, diaphoresis and fever.  HENT: Negative for facial swelling and trouble swallowing.   Respiratory: Negative for cough, chest tightness and shortness of breath.   Cardiovascular: Negative for chest pain.  Skin: Positive for rash.       Mrs. Voeltz has continued bilateral lower extremity edema with extension of erythema to her proximal medial thigh. She has a shallow ulceration over her right lateral abdomen with bruising versus erythema surrounding the area. Mrs. Ruffner has diffuse raised erythematous lesions over her anterior chest, abdomen, and lower back.  She denies pruritus.    Objective:   Physical Exam:  There were no vitals taken for this visit. ECOG: 1  Physical Exam  Constitutional: No distress.  HENT:  Head: Normocephalic and atraumatic.  Cardiovascular: Normal rate, regular rhythm and normal heart sounds. Exam reveals no gallop and no friction rub.  No murmur heard. Pulmonary/Chest: Effort normal and  breath sounds normal. No respiratory distress. She has no wheezes. She has no rales.  Abdominal: Soft. She exhibits no distension and no mass. There is no tenderness. There is no guarding.  Neurological: She is alert.  Skin: Skin is warm and dry. Rash noted. She is not diaphoretic. There is erythema.  There is a superficial ulceration over the right lateral abdomen with associated bruising versus erythema.  There is a diffuse erythematous rash over the superior anterior chest, abdomen, and lower back.  Bilateral lower extremity edema is noted.  There is progression of erythema over the right anterior lower excess extremity which now extends to the right proximal, medial thigh.  Minimal increase in warmth is noted.    Lab Review:  Component Value Date/Time   NA 137 10/22/2017 0753   K 3.8 10/22/2017 0753   CL 102 10/22/2017 0753   CO2 23 10/22/2017 0753   GLUCOSE 120 (H) 10/22/2017 0753   BUN 15 10/22/2017 0753   CREATININE 1.21 (H) 10/22/2017 0753   CALCIUM 8.2 (L) 10/22/2017 0753   PROT 7.6 10/22/2017 0753   ALBUMIN 2.6 (L) 10/22/2017 0753   AST 22 10/22/2017 0753   ALT 13 10/22/2017 0753   ALKPHOS 92 10/22/2017 0753   BILITOT 0.7 10/22/2017 0753   GFRNONAA 45 (L) 10/22/2017 0753   GFRAA 52 (L) 10/22/2017 0753       Component Value Date/Time   WBC 3.4 (L) 10/22/2017 0753   WBC 11.8 (H) 09/27/2017 0215   RBC 3.26 (L) 10/22/2017 0753   HGB 9.4 (L) 10/22/2017 0753   HCT 28.7 (L) 10/22/2017 0753   PLT 81 (L) 10/22/2017 0753   MCV 88.3 10/22/2017 0753   MCH 28.8 10/22/2017 0753   MCHC 32.6 10/22/2017 0753   RDW 24.0 (H) 10/22/2017 0753   LYMPHSABS 0.3 (L) 10/22/2017 0753   MONOABS 0.2 10/22/2017 0753   EOSABS 0.9 (H) 10/22/2017 0753   BASOSABS 0.0 10/22/2017 0753   -------------------------------  Imaging from last 24 hours (if applicable):  Radiology interpretation: Dg Chest 1 View  Result Date: 09/23/2017 CLINICAL DATA:  Dyspnea. EXAM: CHEST  1 VIEW  COMPARISON:  Chest CT 09/19/2017, chest radiograph 09/19/2017 FINDINGS: The cardiac silhouette is enlarged. Calcific atherosclerotic disease of the aorta. No evidence of pneumothorax. Layering right pleural effusion. Right lower and middle lobe peribronchial airspace disease versus atelectasis. Possible small left pleural effusion versus pleural thickening. Osseous structures are without acute abnormality. Soft tissues are grossly normal. IMPRESSION: Layering right pleural effusion with right lower and middle lobe peribronchial airspace disease versus atelectasis. Enlarged cardiac silhouette. Possible small left pleural effusion versus left pleural thickening. Electronically Signed   By: Fidela Salisbury M.D.   On: 09/23/2017 13:24   Dg Chest 2 View  Result Date: 09/28/2017 CLINICAL DATA:  67 y/o  F; ascites. EXAM: CHEST - 2 VIEW COMPARISON:  09/23/2017 chest radiograph. FINDINGS: Stable cardiomegaly given projection and technique. Pulmonary venous hypertension. Increased small right and moderate left pleural effusions with associated basilar atelectasis. No acute osseous abnormality is evident. IMPRESSION: Increased small right and moderate left pleural effusions with associated basilar atelectasis. Pulmonary venous hypertension and cardiomegaly. Electronically Signed   By: Kristine Garbe M.D.   On: 09/28/2017 04:34   Ct Bone Marrow Biopsy & Aspiration  Result Date: 09/23/2017 CLINICAL DATA:  Lytic bone lesions, abnormal serum electrophoresis and clinical suspicion of multiple myeloma. Bone marrow biopsy required for further workup. EXAM: CT GUIDED BONE MARROW ASPIRATION AND BIOPSY ANESTHESIA/SEDATION: Versed 1.0 mg IV, Fentanyl 25 mcg IV Total Moderate Sedation Time:   16 minutes. The patient's level of consciousness and physiologic status were continuously monitored during the procedure by Radiology nursing. PROCEDURE: The procedure risks, benefits, and alternatives were explained to the  patient. Questions regarding the procedure were encouraged and answered. The patient understands and consents to the procedure. A time out was performed prior to initiating the procedure. The right gluteal region was prepped with chlorhexidine. Sterile gown and sterile gloves were used for the procedure. Local anesthesia was provided with 1% Lidocaine. Under CT guidance, an 11 gauge On Control bone cutting needle was advanced from a posterior approach into the right iliac bone. Needle positioning was confirmed with CT. Initial non heparinized and heparinized aspirate  samples were obtained of bone marrow. Core biopsy was performed via the On Control drill needle. COMPLICATIONS: None FINDINGS: Inspection of initial aspirate did reveal visible particles. Intact core biopsy sample was obtained. IMPRESSION: CT guided bone marrow biopsy of right posterior iliac bone with both aspirate and core samples obtained. Electronically Signed   By: Aletta Edouard M.D.   On: 09/23/2017 15:32   US Abdomen Limited Ruq  Result Date: 09/28/2017 CLINICAL DATA:  Assess liver and evaluate for ascites. EXAM: ULTRASOUND ABDOMEN LIMITED RIGHT UPPER QUADRANT COMPARISON:  Abdominal ultrasound performed 09/21/2017 FINDINGS: Gallbladder: Gallbladder wall thickening is noted, measuring up to 6 mm. No stones or sludge are seen. No pericholecystic fluid is appreciated. No ultrasonographic Murphy's sign is identified. Common bile duct: Diameter: 0.4 cm, within normal limits in caliber. Liver: Several cysts are noted at the right hepatic dome. Hepatic cysts measure up to 3.1 cm in size. Within normal limits in parenchymal echogenicity. Portal vein is patent on color Doppler imaging with normal direction of blood flow towards the liver. No definite ascites is noted. IMPRESSION: 1. No acute abnormality seen to explain the patient's symptoms. No ascites seen. 2. Gallbladder wall thickening is nonspecific and may reflect mild chronic inflammation. 3.  Hepatic cysts noted. Electronically Signed   By: Garald Balding M.D.   On: 09/28/2017 04:11        This patient was seen with Dr. Benay Spice with my treatment plan reviewed with him. He expressed agreement with my medical management of this patient. This patient was seen with Dr. Alen Blew with my treatment plan reviewed with him. He expressed agreement with my medical management of this patient. This was a shared visit with Sandi Mealy.  Ms. Silveria was interviewed and examined.  She has developed a rash over the trunk and extremities.  The rash at the upper chest and back is maculopapular and consistent with a drug rash.  There are areas of confluent hyperpigmentation over the right thigh and lower leg.  There is an ulcerated area at an injection site at the right abdomen with surrounding confluent hyperpigmentation.  She may have a cellulitis of the right lower extremity.  There may be a component of bleeding.  She could also have a Revlimid rash.  I have a low clinical suspicion for warfarin necrosis.  She had an elevated prothrombin time over the month prior to beginning Coumadin and the right leg rash was present prior to beginning Coumadin.  We decided to hold Revlimid and Coumadin.  She will be treated for cellulitis and return for an office visit tomorrow.  Julieanne Manson, MD

## 2017-10-22 NOTE — Progress Notes (Signed)
These preliminary result these preliminary results were noted.  Awaiting final report.

## 2017-10-22 NOTE — Progress Notes (Signed)
Pt has bilat lower leg edema w/+1 pitting on both sides.  Pt also has red scaly nontender rash without itching on R lower leg.  Pt states that this rash has gotten worse.  Rash appears blotchy and bruised in appearance on upper part of R thigh.  Pt also has raised diffuse rash without redness or itching across upper part of body including chest and back and bilat arms.  PT also has broken skin/scabbing/ulceration on R side of abdomen, reports to be last site of velcade shot. Afebrile.  Temp 99.6 then 98.7.  A&Ox4.  Pulses palpable in bilat feet. Denies pain or loss of sensation.  Ambulatory w/steady gait.  Denies pain or any other concerns.  To be assessed by PA Lucianne Lei at chairside before starting treatment.  Pt assessed by PA Lucianne Lei.  PT not to receive treatment today.  Verbal order to draw 2 sets blood cultures, IV access, and take urine sample.  One set of BC's drawn from R hand, second set cancelled per verbal order by MD [place].  Urine taken.  IV access achieved in [facility].  MD Sherrill at chairside to assess pt.  Pt transferred to [facility] for further tx and evaluation.

## 2017-10-23 ENCOUNTER — Inpatient Hospital Stay (HOSPITAL_BASED_OUTPATIENT_CLINIC_OR_DEPARTMENT_OTHER): Payer: Self-pay | Admitting: Medical

## 2017-10-23 ENCOUNTER — Telehealth: Payer: Self-pay | Admitting: Medical

## 2017-10-23 VITALS — BP 142/77 | HR 80 | Temp 98.0°F | Resp 18 | Ht 68.0 in | Wt 206.4 lb

## 2017-10-23 DIAGNOSIS — I4892 Unspecified atrial flutter: Secondary | ICD-10-CM

## 2017-10-23 DIAGNOSIS — Z79899 Other long term (current) drug therapy: Secondary | ICD-10-CM

## 2017-10-23 DIAGNOSIS — C9000 Multiple myeloma not having achieved remission: Secondary | ICD-10-CM

## 2017-10-23 DIAGNOSIS — I872 Venous insufficiency (chronic) (peripheral): Secondary | ICD-10-CM

## 2017-10-23 DIAGNOSIS — L299 Pruritus, unspecified: Secondary | ICD-10-CM

## 2017-10-23 DIAGNOSIS — L270 Generalized skin eruption due to drugs and medicaments taken internally: Secondary | ICD-10-CM

## 2017-10-23 DIAGNOSIS — R109 Unspecified abdominal pain: Secondary | ICD-10-CM

## 2017-10-23 DIAGNOSIS — L03115 Cellulitis of right lower limb: Secondary | ICD-10-CM

## 2017-10-23 DIAGNOSIS — R600 Localized edema: Secondary | ICD-10-CM

## 2017-10-23 LAB — URINE CULTURE

## 2017-10-23 MED ORDER — OMEPRAZOLE 40 MG PO CPDR: 40.0000 mg | DELAYED_RELEASE_CAPSULE | Freq: Every day | ORAL | 2 refills | Status: DC

## 2017-10-23 MED ORDER — PREDNISONE 20 MG PO TABS: ORAL_TABLET | ORAL | 0 refills | Status: DC

## 2017-10-23 MED ORDER — DIPHENHYDRAMINE HCL 25 MG PO CAPS
ORAL_CAPSULE | ORAL | Status: AC
  Filled 2017-10-23: qty 1

## 2017-10-23 MED ORDER — PREDNISONE 50 MG PO TABS
ORAL_TABLET | ORAL | Status: AC
  Filled 2017-10-23: qty 2

## 2017-10-23 MED ORDER — PREDNISONE 50 MG PO TABS
100.0000 mg | ORAL_TABLET | Freq: Every day | ORAL | Status: DC
  Administered 2017-10-23: 100 mg via ORAL

## 2017-10-23 MED ORDER — DIPHENHYDRAMINE HCL 25 MG PO TABS: 25.0000 mg | ORAL_TABLET | Freq: Four times a day (QID) | ORAL | 0 refills | Status: DC | PRN

## 2017-10-23 MED ORDER — DIPHENHYDRAMINE HCL 25 MG PO TABS
25.0000 mg | ORAL_TABLET | Freq: Once | ORAL | Status: AC
  Administered 2017-10-23: 25 mg via ORAL
  Filled 2017-10-23: qty 1

## 2017-10-23 MED ORDER — TRAZODONE HCL 50 MG PO TABS: 50.0000 mg | ORAL_TABLET | Freq: Every day | ORAL | 0 refills | Status: DC

## 2017-10-23 MED FILL — DIPHENHYDRAMINE 25 MG CAPLE: 25 | 25 days supply | Qty: 100 | Fill #0

## 2017-10-23 MED FILL — OMEPRAZOLE 40 MG CPDR: 40 | 30 days supply | Qty: 30 | Fill #0

## 2017-10-23 MED FILL — traZODone HCL 50 MG TABS: 50 | 30 days supply | Qty: 30 | Fill #0

## 2017-10-23 MED FILL — predniSONE 20 MG TABS: 20 | 16 days supply | Qty: 75 | Fill #0

## 2017-10-23 NOTE — Progress Notes (Signed)
These preliminary result these preliminary results were noted.  Awaiting final report.

## 2017-10-23 NOTE — Progress Notes (Signed)
[facility] Progress Note   Debbie Chan [id number] 09-22-1950 67 y.o.  Debbie Chan is managed by Dr. Fanny Bien. 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 x1 today and Benadryl 25 mg p.o. X1.  She was told to hold Revlimid and Coumadin.  She was told to stop doxycycline.  A prescription for prednisone 20 mg tablets, 4 and 1/2 tablets once daily, Benadryl 25 mg every 6 hours, and trazodone 50 mg p.o. nightly was sent to the patient's pharmacy.  She was also given a prescription for omeprazole 40 mg once daily for gastritis prophylaxis as she will be taking higher doses of prednisone.  She will return as previously scheduled on 10/29/2017.  Bilateral lower extremity edema: The patient was instructed to continue using Lasix as needed for her bilateral lower extremity edema.  Atrial flutter: The patient was instructed to continue taking metoprolol 12.5 mg p.o. twice daily.  Multiple myeloma: The patient will hold Revlimid for now and will return as scheduled on 10/29/2017.  Please see After Visit Summary for patient specific instructions.  Future Appointments  Date Time Provider [place]  10/29/2017  1:30 PM [facility] LAB 5 [facility] None  10/29/2017  2:00 PM Curcio, Roselie Awkward, NP [facility] None  10/29/2017  3:00 PM  [facility] INFUSION [facility] None  11/05/2017  8:45 AM [facility] LAB 4 [facility] None  11/05/2017  9:45 AM [facility] INFUSION [facility] None  11/12/2017  2:30 PM [facility] LAB 4 [facility] None  11/12/2017  3:00 PM Curcio, Roselie Awkward, NP [facility] None  11/12/2017  3:30 PM [facility] INFUSION [facility] None    No orders of the defined types were placed in this encounter.      Subjective:   Patient ID:  Debbie Chan is a 67 y.o. (DOB 07/15/1950) female.  Chief Complaint:  Chief Complaint  Patient presents with  . Rash    HPI Debbie Chan is a 67 year old female who is managed by Dr. Fanny Bien. Debbie Chan for her multiple myeloma. She had been seen in infusion yesterday as she presented for cycle 1, day15 of Velcade. Shecompleted her first 14/21 day cycle ofRevlimid yesterday according to her daughter. She was seen on 10/08/2017 for bilateral lower extremity edema and again on 10/15/2017 and continued to have bilateral lower extremity edema and mild erythema with a slight increased warmth of the right anterior lower extremity.  Debbie Chan was given a prescription for Keflex 500 mg p.o. twice daily at that time.  Despite this, she has progression of erythema over her right anterior lower extremity with extension to her right proximal medial thigh.   She also has an area of a shallow ulceration of the right abdomen.  She is also noted to have a diffuse erythematous slightly raised rash over her chest and back.  She was seen with Dr's Benay Spice and [facility] yesterday and was given vancomycin IV and was begun on doxycycline for possible cellulitis. She  was told to stop Coumadin.  She presents to the office today for follow-up and to be seen with Dr. Fanny Bien. Debbie Chan.  She presents to the office today with her son.  Medications: I have reviewed the patient's current medications.  Allergies: No Known Allergies  No past medical history on file.  No past surgical history on  file.  No family history on file.  Social History   Socioeconomic History  . Marital status: Widowed    Spouse name: Not on file  . Number of children: 4  . Years of education: Not on file  . Highest education level: Not on file  Occupational History  . Not on file  Social Needs  . Financial resource strain: Not on file  . Food insecurity:    Worry: Not on file    Inability: Not on file  . Transportation needs:    Medical: Not on file    Non-medical: Not on file  Tobacco Use  . Smoking status: Never Smoker  . Smokeless tobacco: Never Used  Substance and Sexual Activity  . Alcohol use: Never    Frequency: Never  . Drug use: Never  . Sexual activity: Not on file  Lifestyle  . Physical activity:    Days per week: Not on file    Minutes per session: Not on file  . Stress: Not on file  Relationships  . Social connections:    Talks on phone: Not on file    Gets together: Not on file    Attends religious service: Not on file    Active member of club or organization: Not on file    Attends meetings of clubs or organizations: Not on file    Relationship status: Not on file  . Intimate partner violence:    Fear of current or ex partner: Not on file    Emotionally abused: Not on file    Physically abused: Not on file    Forced sexual activity: Not on file  Other Topics Concern  . Not on file  Social History Narrative  . Not on file    Past Medical History, Surgical history, Social history, and Family history were reviewed and updated as appropriate.   Please see review of systems for further details on the patient's review from today.   Review of Systems:  Review of Systems  Constitutional: Negative for chills, diaphoresis and fever.  HENT: Negative for mouth sores, sore throat and trouble swallowing.   Respiratory: Negative for shortness of breath.   Cardiovascular: Positive for leg swelling. Negative for chest pain.  Skin: Positive for color change and rash.        Extensive erythematous, raised rash over the chest, upper back, abdomen, and lower back.  Extensive erythema and bruising over the right anterior lower extremity.    Objective:   Physical Exam:  BP (!) 142/77 (BP Location: Left Arm, Patient Position: Sitting) Comment: Notified Nurse of Bp  Pulse 80   Temp 98 F (36.7 C) (Oral)   Resp 18   Ht '5\' 8"'  (1.727 m)   Wt 206 lb 6.4 oz (93.6 kg)   SpO2 100%   BMI 31.38 kg/m  ECOG: 0  Physical Exam  Constitutional: No distress.  The patient is an adult female who appears to be in no acute distress.  HENT:  Head: Normocephalic and atraumatic.  Neurological: She is alert. Coordination normal.  Skin: Rash noted. She is not diaphoretic. There is erythema.  Extensive erythematous raised  rash over the posterior upper back and upper chest.  Extensive erythema over the lower back and abdomen is noted.  There is an area of shallow ulceration in the right lateral abdomen with associated bruising.  There is extensive erythema and bruising over the right anterior lower extremity.               Lab Review:     Component Value Date/Time   NA 137 10/22/2017 0753   K 3.8 10/22/2017 0753   CL 102 10/22/2017 0753   CO2 23 10/22/2017 0753   GLUCOSE 120 (H) 10/22/2017 0753   BUN 15 10/22/2017 0753   CREATININE 1.21 (H) 10/22/2017 0753   CALCIUM 8.2 (L) 10/22/2017 0753   PROT 7.6 10/22/2017 0753   ALBUMIN 2.6 (L) 10/22/2017 0753   AST 22 10/22/2017 0753   ALT 13 10/22/2017 0753   ALKPHOS 92 10/22/2017 0753   BILITOT 0.7 10/22/2017 0753   GFRNONAA 45 (L) 10/22/2017 0753   GFRAA 52 (L) 10/22/2017 0753       Component Value Date/Time   WBC 3.4 (L) 10/22/2017 0753   WBC 11.8 (H) 09/27/2017 0215   RBC 3.26 (L) 10/22/2017 0753   HGB 9.4 (L) 10/22/2017 0753   HCT 28.7 (L) 10/22/2017 0753   PLT 81 (L) 10/22/2017 0753   MCV 88.3 10/22/2017 0753   MCH 28.8 10/22/2017 0753   MCHC 32.6 10/22/2017 0753   RDW 24.0 (H) 10/22/2017 0753    LYMPHSABS 0.3 (L) 10/22/2017 0753   MONOABS 0.2 10/22/2017 0753   EOSABS 0.9 (H) 10/22/2017 0753   BASOSABS 0.0 10/22/2017 0753   -------------------------------  Imaging from last 24 hours (if applicable):  Radiology interpretation: Dg Chest 2 View  Result Date: 09/28/2017 CLINICAL DATA:  67 y/o  F; ascites. EXAM: CHEST - 2 VIEW COMPARISON:  09/23/2017 chest radiograph. FINDINGS: Stable cardiomegaly given projection and technique. Pulmonary venous hypertension. Increased small right and moderate left pleural effusions with associated basilar atelectasis. No acute osseous abnormality is evident. IMPRESSION: Increased small right and moderate left pleural effusions with associated basilar atelectasis. Pulmonary venous hypertension and cardiomegaly. Electronically Signed   By: Kristine Garbe M.D.   On: 09/28/2017 04:34   US Abdomen Limited Ruq  Result Date: 09/28/2017 CLINICAL DATA:  Assess liver and evaluate for ascites. EXAM: ULTRASOUND ABDOMEN LIMITED RIGHT UPPER QUADRANT COMPARISON:  Abdominal ultrasound performed 09/21/2017 FINDINGS: Gallbladder: Gallbladder wall thickening is noted, measuring up to 6 mm. No stones or sludge are seen. No pericholecystic fluid is appreciated. No ultrasonographic Murphy's sign is identified. Common bile duct: Diameter: 0.4 cm, within normal limits in caliber. Liver: Several cysts are noted at the right hepatic dome. Hepatic cysts measure up to 3.1 cm in size. Within normal limits in parenchymal echogenicity. Portal vein is patent on color Doppler imaging with normal direction of blood flow towards the liver. No definite ascites is noted. IMPRESSION: 1. No acute abnormality seen to explain the patient's symptoms. No ascites seen. 2. Gallbladder wall thickening is nonspecific and may reflect mild chronic inflammation. 3. Hepatic cysts noted. Electronically Signed   By: Garald Balding M.D.   On: 09/28/2017 04:11        This patient was seen with Dr. Julien Nordmann  with my treatment plan reviewed with him. He expressed agreement with my medical management of this patient.   ADDENDUM: Hematology/Oncology Attending: I had a face-to-face encounter with the patient.  I recommended her care plan.  This is a very pleasant 67 years old  African female who was recently diagnosed with multiple myeloma, IgA subtype.  She is currently undergoing treatment with subcutaneous Velcade, Revlimid and Decadron.  The patient has been tolerating this treatment very well and she has signs of improvement of her condition. Unfortunately she presented with significant skin rash involving the lower extremities as well as chest and back. This is likely to be drug-induced his skin rash and most likely from Revlimid. I recommended for the patient to hold all the treatment and medications for now except her blood pressure medication. I will start the patient on treatment with prednisone 1 mg/kg for the next 1-2 weeks in addition to antihistamine either with Benadryl or trazodone. We will arrange for the patient to come back for follow-up visit in 1 week for reevaluation and close monitoring of her skin rash before resuming any treatment. She was advised to call immediately if she has no improvement of her condition or worsening of the skin rash.  Disclaimer: This note was dictated with voice recognition software. Similar sounding words can inadvertently be transcribed and may be missed upon review. Eilleen Kempf, MD 10/25/17

## 2017-10-23 NOTE — Telephone Encounter (Signed)
No 8/30 los/orders/referrals

## 2017-10-23 NOTE — Patient Instructions (Signed)

## 2017-10-25 ENCOUNTER — Encounter: Payer: Self-pay | Admitting: Medical

## 2017-10-27 ENCOUNTER — Other Ambulatory Visit: Payer: Self-pay | Admitting: Medical Oncology

## 2017-10-27 DIAGNOSIS — C9000 Multiple myeloma not having achieved remission: Secondary | ICD-10-CM

## 2017-10-27 LAB — CULTURE, BLOOD (SINGLE): Culture: NO GROWTH

## 2017-10-27 MED ORDER — LENALIDOMIDE 25 MG PO CAPS: 25.0000 mg | ORAL_CAPSULE | Freq: Every day | ORAL | 0 refills | Status: DC

## 2017-10-27 NOTE — Progress Notes (Signed)
These preliminary result these preliminary results were noted.  Awaiting final report.

## 2017-10-29 ENCOUNTER — Inpatient Hospital Stay (HOSPITAL_BASED_OUTPATIENT_CLINIC_OR_DEPARTMENT_OTHER): Payer: Self-pay | Admitting: Oncology

## 2017-10-29 ENCOUNTER — Inpatient Hospital Stay: Payer: Self-pay

## 2017-10-29 ENCOUNTER — Encounter: Payer: Self-pay | Admitting: Oncology

## 2017-10-29 ENCOUNTER — Telehealth: Payer: Self-pay | Admitting: Oncology

## 2017-10-29 ENCOUNTER — Inpatient Hospital Stay: Payer: Self-pay | Attending: Internal Medicine

## 2017-10-29 VITALS — BP 129/72 | HR 66 | Temp 97.9°F | Resp 17 | Ht 68.0 in | Wt 214.7 lb

## 2017-10-29 DIAGNOSIS — Z5111 Encounter for antineoplastic chemotherapy: Secondary | ICD-10-CM | POA: Insufficient documentation

## 2017-10-29 DIAGNOSIS — Z79899 Other long term (current) drug therapy: Secondary | ICD-10-CM | POA: Insufficient documentation

## 2017-10-29 DIAGNOSIS — Z7901 Long term (current) use of anticoagulants: Secondary | ICD-10-CM

## 2017-10-29 DIAGNOSIS — C9000 Multiple myeloma not having achieved remission: Secondary | ICD-10-CM

## 2017-10-29 DIAGNOSIS — L299 Pruritus, unspecified: Secondary | ICD-10-CM

## 2017-10-29 DIAGNOSIS — R21 Rash and other nonspecific skin eruption: Secondary | ICD-10-CM | POA: Insufficient documentation

## 2017-10-29 DIAGNOSIS — L270 Generalized skin eruption due to drugs and medicaments taken internally: Secondary | ICD-10-CM

## 2017-10-29 LAB — CBC WITH DIFFERENTIAL (CANCER CENTER ONLY)
Basophils Absolute: 0 10*3/uL (ref 0.0–0.1)
Basophils Relative: 0 %
EOS ABS: 0 10*3/uL (ref 0.0–0.5)
Eosinophils Relative: 1 %
HCT: 31.3 % — ABNORMAL LOW (ref 34.8–46.6)
HEMOGLOBIN: 10.2 g/dL — AB (ref 11.6–15.9)
LYMPHS ABS: 1 10*3/uL (ref 0.9–3.3)
Lymphocytes Relative: 21 %
MCH: 29.1 pg (ref 25.1–34.0)
MCHC: 32.6 g/dL (ref 31.5–36.0)
MCV: 89.2 fL (ref 79.5–101.0)
MONOS PCT: 8 %
Monocytes Absolute: 0.4 10*3/uL (ref 0.1–0.9)
NEUTROS PCT: 70 %
Neutro Abs: 3.4 10*3/uL (ref 1.5–6.5)
Platelet Count: 159 10*3/uL (ref 145–400)
RBC: 3.51 MIL/uL — ABNORMAL LOW (ref 3.70–5.45)
RDW: 22.7 % — ABNORMAL HIGH (ref 11.2–14.5)
WBC Count: 4.8 10*3/uL (ref 3.9–10.3)

## 2017-10-29 LAB — CMP (CANCER CENTER ONLY)
ALT: 30 U/L (ref 0–44)
AST: 24 U/L (ref 15–41)
Albumin: 2.9 g/dL — ABNORMAL LOW (ref 3.5–5.0)
Alkaline Phosphatase: 111 U/L (ref 38–126)
Anion gap: 9 (ref 5–15)
BUN: 24 mg/dL — ABNORMAL HIGH (ref 8–23)
CO2: 26 mmol/L (ref 22–32)
Calcium: 8.5 mg/dL — ABNORMAL LOW (ref 8.9–10.3)
Chloride: 104 mmol/L (ref 98–111)
Creatinine: 1.36 mg/dL — ABNORMAL HIGH (ref 0.44–1.00)
GFR, Est AFR Am: 46 mL/min — ABNORMAL LOW (ref >60)
GFR, Estimated: 39 mL/min — ABNORMAL LOW (ref >60)
Glucose, Bld: 178 mg/dL — ABNORMAL HIGH (ref 70–99)
Potassium: 4.9 mmol/L (ref 3.5–5.1)
Sodium: 139 mmol/L (ref 135–145)
Total Bilirubin: 0.6 mg/dL (ref 0.3–1.2)
Total Protein: 7.5 g/dL (ref 6.5–8.1)

## 2017-10-29 MED ORDER — PREDNISONE 20 MG PO TABS: ORAL_TABLET | ORAL | 0 refills | Status: DC

## 2017-10-29 MED FILL — predniSONE 20 MG TABS: 20 | 14 days supply | Qty: 42 | Fill #0

## 2017-10-29 NOTE — Progress Notes (Signed)
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  PRIOR THERAPY: None  CURRENT THERAPY: Systemic therapy with Velcade 1.3 mg/M2 weekly, Revlimid 25 mg p.o. daily for 14 days every 3 weeks in addition to weekly Decadron 40 mg orally.  First dose of treatment 10/08/2017.  Treatment was placed on hold due to development of a significant rash.  INTERVAL HISTORY: Debbie Chan 67 y.o. female returns for routine follow-up visit accompanied by her daughter-in-law.  The patient is feeling better than she did several days ago when she was seen for rash.  She was started on prednisone 90 mg daily and the rash has now dried up.  She has very little itching at this point in time.  She uses Benadryl on as-needed basis.  The patient denies fevers and chills.  Denies chest pain, shortness breath, cough, hemoptysis.  Denies nausea, vomiting, constipation, diarrhea.  Denies recent weight loss or night sweats.  The patient is here for evaluation and repeat lab work.  MEDICAL HISTORY:History reviewed. No pertinent past medical history.  ALLERGIES:  has No Known Allergies.  MEDICATIONS:  Current Outpatient Medications  Medication Sig Dispense Refill  . diphenhydrAMINE (BENADRYL ALLERGY) 25 MG tablet Take 1 tablet (25 mg total) by mouth every 6 (six) hours as needed for itching. 100 tablet 0  . furosemide (LASIX) 20 MG tablet Take once daily for 3 days for lower extremity edema, then stop. Repeat as needed for recurrent edema. 30 tablet 2  . metoprolol tartrate (LOPRESSOR) 25 MG tablet Take 0.5 tablets (12.5 mg total) by mouth 2 (two) times daily. 30 tablet 0  . omeprazole (PRILOSEC) 40 MG capsule Take 1 capsule (40 mg total) by mouth daily. 30 capsule 2  . predniSONE (DELTASONE) 20 MG tablet 4 and 1/2 tablets daily with breakfast starting on 08/31 75 tablet 0  . traZODone (DESYREL) 50  MG tablet Take 1 tablet (50 mg total) by mouth at bedtime. 30 tablet 0  . acyclovir (ZOVIRAX) 200 MG capsule Take 1 capsule (200 mg total) by mouth 2 (two) times daily. (Patient not taking: Reported on 10/29/2017) 60 capsule 2  . blood glucose meter kit and supplies Dispense based on patient and insurance preference. Use up to four times daily as directed. (FOR ICD-10 E10.9, E11.9). (Patient not taking: Reported on 10/29/2017) 1 each 0  . cephALEXin (KEFLEX) 500 MG capsule Take 1 capsule (500 mg total) by mouth 2 (two) times daily. (Patient not taking: Reported on 10/23/2017) 14 capsule 0  . dexamethasone (DECADRON) 4 MG tablet Take 10 tablets (40 mg total) by mouth every 7 (seven) days. (Patient not taking: Reported on 10/29/2017) 40 tablet 3  . lenalidomide (REVLIMID) 25 MG capsule Take 1 capsule (25 mg total) by mouth daily. Take 1 capsule by mouth one daily for 14 days on, 7 days off, repeat every 21 days (Patient not taking: Reported on 10/29/2017) 14 capsule 0  . polyethylene glycol (MIRALAX / GLYCOLAX) packet Take 17 g by mouth daily. (Patient not taking: Reported on 10/29/2017) 14 each 0  . prochlorperazine (COMPAZINE) 10 MG tablet Take 1 tablet (10 mg total) by mouth every 6 (six) hours as needed for nausea or vomiting. (Patient not taking: Reported on 10/06/2017) 30 tablet 0  . warfarin (COUMADIN) 2 MG tablet Take 1 tablet (2 mg total) by mouth daily. Begin taking once daily when you start taking Revlimid. (Patient not  taking: Reported on 10/23/2017) 30 tablet 5   No current facility-administered medications for this visit.     SURGICAL HISTORY: History reviewed. No pertinent surgical history.  REVIEW OF SYSTEMS:   Review of Systems  Constitutional: Negative for appetite change, chills, fever and unexpected weight change.  Positive for fatigue. HENT:   Negative for mouth sores, nosebleeds, sore throat and trouble swallowing.   Eyes: Negative for eye problems and icterus.  Respiratory: Negative for  cough, hemoptysis, shortness of breath and wheezing.   Cardiovascular: Negative for chest pain and leg swelling.  Gastrointestinal: Negative for constipation, diarrhea, nausea and vomiting.  Genitourinary: Negative for bladder incontinence, difficulty urinating, dysuria, frequency and hematuria.   Musculoskeletal: Negative for back pain, gait problem, neck pain and neck stiffness.  Skin: Positive for itching and rash which are improving.  Neurological: Negative for dizziness, extremity weakness, gait problem, headaches, light-headedness and seizures.  Hematological: Negative for adenopathy. Does not bruise/bleed easily.  Psychiatric/Behavioral: Negative for confusion, depression and sleep disturbance. The patient is not nervous/anxious.     PHYSICAL EXAMINATION:  Blood pressure 129/72, pulse 66, temperature 97.9 F (36.6 C), temperature source Oral, resp. rate 17, height 5' 8" (1.727 m), weight 214 lb 11.2 oz (97.4 kg), SpO2 98 %.  ECOG PERFORMANCE STATUS: 1 - Symptomatic but completely ambulatory  Physical Exam  Constitutional: Oriented to person, place, and time and well-developed, well-nourished, and in no distress. No distress.  HENT:  Head: Normocephalic and atraumatic.  Mouth/Throat: Oropharynx is clear and moist. No oropharyngeal exudate.  Eyes: Conjunctivae are normal. Right eye exhibits no discharge. Left eye exhibits no discharge. No scleral icterus.  Neck: Normal range of motion. Neck supple.  Cardiovascular: Normal rate, regular rhythm, normal heart sounds and intact distal pulses.   Pulmonary/Chest: Effort normal and breath sounds normal. No respiratory distress. No wheezes. No rales.  Abdominal: Soft. Bowel sounds are normal. Exhibits no mass.  Abdomen distended.  There is no tenderness.  Musculoskeletal: Normal range of motion. Exhibits no edema.  Lymphadenopathy:    No cervical adenopathy.  Neurological: Alert and oriented to person, place, and time. Exhibits normal  muscle tone. Gait normal. Coordination normal.  Skin: Rash improving.  Now has a dry, flaky skin over her back, neck, chest, abdomen, and legs. Psychiatric: Mood, memory and judgment normal.  Vitals reviewed.        LABORATORY DATA: Lab Results  Component Value Date   WBC 4.8 10/29/2017   HGB 10.2 (L) 10/29/2017   HCT 31.3 (L) 10/29/2017   MCV 89.2 10/29/2017   PLT 159 10/29/2017      Chemistry      Component Value Date/Time   NA 139 10/29/2017 1314   K 4.9 10/29/2017 1314   CL 104 10/29/2017 1314   CO2 26 10/29/2017 1314   BUN 24 (H) 10/29/2017 1314   CREATININE 1.36 (H) 10/29/2017 1314      Component Value Date/Time   CALCIUM 8.5 (L) 10/29/2017 1314   ALKPHOS 111 10/29/2017 1314   AST 24 10/29/2017 1314   ALT 30 10/29/2017 1314   BILITOT 0.6 10/29/2017 1314       RADIOGRAPHIC STUDIES:  No results found.   ASSESSMENT/PLAN:  Multiple myeloma without remission (HCC) This is a very pleasant 67 year old African female originally from [place] who is visiting her son in [place] and was recently diagnosed with IgA multiple myeloma. The patient was started on systemic therapy with Velcade 1.3 mg/M2 weekly in addition to Revlimid  25 mg p.o. daily for 14 days every 3 weeks as well as weekly Decadron 40 mg orally.    She developed significant rash and her treatment was placed on hold.  Rash is likely due to Revlimid. She is currently on prednisone 90 mg daily.  The patient was seen with Dr. Earlie Server.  Discussed with the patient and her daughter-in-law that the rash is improving and it was likely due to the Revlimid.  We discussed that prednisone can help in the treatment of myeloma.  We will continue to hold her Velcade and dexamethasone until her rash continues to improve.  We will not resume Revlimid.  The patient will complete 1 week of prednisone 90 mg daily and then decrease to 70 mg daily x1 week and then decreased to 50 mg daily x1 week.  We will see  her back in approximately 2 weeks for evaluation and repeat lab work.  At that time we will continue to decrease the dose of her prednisone.  She was advised to use moisturizer on her dry skin.  The patient was advised to call immediately if she has any concerning symptoms in the interval. The patient voices understanding of current disease status and treatment options and is in agreement with the current care plan.  All questions were answered. The patient knows to call the clinic with any problems, questions or concerns. We can certainly see the patient much sooner if necessary.   No orders of the defined types were placed in this encounter.    Mikey Bussing, DNP, AGPCNP-BC, AOCNP 10/29/17   ADDENDUM: Hematology/Oncology Attending: I had a face-to-face encounter with the patient.  I recommended her care plan.  This is a very pleasant 67 years old African female recently diagnosed with multiple myeloma, IgA subtype.  The patient was a started on treatment with subcutaneous Velcade weekly, Revlimid 25 mg p.o. daily for 21 days every 4 weeks in addition to weekly Decadron.  She had significant skin rash on the chest back as well as lower extremities started less than 2 weeks ago.  I recommended for the patient to hold on treatment for now.  Her rash was most likely secondary to Revlimid.  I started her on a taper dose of prednisone and she is currently on 90 mg p.o. daily.  Her skin rash has significantly improved.  She will complete 1 week of this dose then she will start at a lower dose of 70 mg p.o. daily for 1 week then 50 mg p.o. daily for 1 week as we gradually taper her from the prednisone. I will see the patient back for follow-up visit in 2 weeks for evaluation before resuming her treatment with Velcade and steroid. The patient was also advised to call immediately if she has any concerning symptoms in the interval.  Disclaimer: This note was dictated with voice recognition software.  Similar sounding words can inadvertently be transcribed and may be missed upon review.  Eilleen Kempf, MD 10/31/17

## 2017-10-29 NOTE — Patient Instructions (Signed)
Take 4 1/2 tablets of Prednisone on 9/6, then decrease to 3 1/2 tablets daily for 1 week, then decrease to 2 1/2 tablets daily. When we see you back, we will give further instructions.

## 2017-10-29 NOTE — Telephone Encounter (Signed)
Cancelled appt per 9/5 los - left message for patient .

## 2017-10-30 NOTE — Addendum Note (Signed)
Addended by: Ardeen Garland on: 10/30/2017 12:31 PM   Modules accepted: Orders

## 2017-10-30 NOTE — Assessment & Plan Note (Signed)
This is a very pleasant 67 year old African female originally from [place] who is visiting her son in [place] and was recently diagnosed with IgA multiple myeloma. The patient was started on systemic therapy with Velcade 1.3 mg/M2 weekly in addition to Revlimid 25 mg p.o. daily for 14 days every 3 weeks as well as weekly Decadron 40 mg orally.    She developed significant rash and her treatment was placed on hold.  Rash is likely due to Revlimid. She is currently on prednisone 90 mg daily.  The patient was seen with Dr. Earlie Server.  Discussed with the patient and her daughter-in-law that the rash is improving and it was likely due to the Revlimid.  We discussed that prednisone can help in the treatment of myeloma.  We will continue to hold her Velcade and dexamethasone until her rash continues to improve.  We will not resume Revlimid.  The patient will complete 1 week of prednisone 90 mg daily and then decrease to 70 mg daily x1 week and then decreased to 50 mg daily x1 week.  We will see her back in approximately 2 weeks for evaluation and repeat lab work.  At that time we will continue to decrease the dose of her prednisone.  She was advised to use moisturizer on her dry skin.  The patient was advised to call immediately if she has any concerning symptoms in the interval. The patient voices understanding of current disease status and treatment options and is in agreement with the current care plan.  All questions were answered. The patient knows to call the clinic with any problems, questions or concerns. We can certainly see the patient much sooner if necessary.

## 2017-10-30 NOTE — Progress Notes (Signed)
Per Julien Nordmann Revlimid discontinued.

## 2017-11-05 ENCOUNTER — Ambulatory Visit: Payer: Self-pay

## 2017-11-05 ENCOUNTER — Other Ambulatory Visit: Payer: Self-pay

## 2017-11-12 ENCOUNTER — Inpatient Hospital Stay: Payer: Self-pay

## 2017-11-12 ENCOUNTER — Telehealth: Payer: Self-pay

## 2017-11-12 ENCOUNTER — Inpatient Hospital Stay (HOSPITAL_BASED_OUTPATIENT_CLINIC_OR_DEPARTMENT_OTHER): Payer: Self-pay | Admitting: Oncology

## 2017-11-12 ENCOUNTER — Encounter: Payer: Self-pay | Admitting: Oncology

## 2017-11-12 VITALS — BP 150/81 | HR 77 | Temp 98.4°F | Resp 18 | Ht 68.0 in | Wt 202.2 lb

## 2017-11-12 DIAGNOSIS — R21 Rash and other nonspecific skin eruption: Secondary | ICD-10-CM

## 2017-11-12 DIAGNOSIS — C9000 Multiple myeloma not having achieved remission: Secondary | ICD-10-CM

## 2017-11-12 DIAGNOSIS — L270 Generalized skin eruption due to drugs and medicaments taken internally: Secondary | ICD-10-CM

## 2017-11-12 DIAGNOSIS — Z5111 Encounter for antineoplastic chemotherapy: Secondary | ICD-10-CM

## 2017-11-12 DIAGNOSIS — L299 Pruritus, unspecified: Secondary | ICD-10-CM

## 2017-11-12 LAB — CBC WITH DIFFERENTIAL (CANCER CENTER ONLY)
Basophils Absolute: 0.1 10*3/uL (ref 0.0–0.1)
Basophils Relative: 2 %
EOS PCT: 0 %
Eosinophils Absolute: 0 10*3/uL (ref 0.0–0.5)
HEMATOCRIT: 35.8 % (ref 34.8–46.6)
Hemoglobin: 11.7 g/dL (ref 11.6–15.9)
LYMPHS ABS: 0.8 10*3/uL — AB (ref 0.9–3.3)
LYMPHS PCT: 13 %
MCH: 29.8 pg (ref 25.1–34.0)
MCHC: 32.5 g/dL (ref 31.5–36.0)
MCV: 91.7 fL (ref 79.5–101.0)
MONO ABS: 0.1 10*3/uL (ref 0.1–0.9)
Monocytes Relative: 2 %
Neutro Abs: 5.1 10*3/uL (ref 1.5–6.5)
Neutrophils Relative %: 83 %
Platelet Count: 32 10*3/uL — ABNORMAL LOW (ref 145–400)
RBC: 3.91 MIL/uL (ref 3.70–5.45)
RDW: 22.2 % — AB (ref 11.2–14.5)
WBC Count: 6.1 10*3/uL (ref 3.9–10.3)

## 2017-11-12 LAB — CMP (CANCER CENTER ONLY)
ALBUMIN: 3.3 g/dL — AB (ref 3.5–5.0)
ALT: 22 U/L (ref 0–44)
ANION GAP: 8 (ref 5–15)
AST: 19 U/L (ref 15–41)
Alkaline Phosphatase: 107 U/L (ref 38–126)
BILIRUBIN TOTAL: 0.7 mg/dL (ref 0.3–1.2)
BUN: 21 mg/dL (ref 8–23)
CHLORIDE: 101 mmol/L (ref 98–111)
CO2: 26 mmol/L (ref 22–32)
Calcium: 9 mg/dL (ref 8.9–10.3)
Creatinine: 1.34 mg/dL — ABNORMAL HIGH (ref 0.44–1.00)
GFR, Est AFR Am: 46 mL/min — ABNORMAL LOW (ref >60)
GFR, Estimated: 40 mL/min — ABNORMAL LOW (ref >60)
Glucose, Bld: 206 mg/dL — ABNORMAL HIGH (ref 70–99)
POTASSIUM: 5 mmol/L (ref 3.5–5.1)
Sodium: 135 mmol/L (ref 135–145)
Total Protein: 7.2 g/dL (ref 6.5–8.1)

## 2017-11-12 MED ORDER — PREDNISONE 20 MG PO TABS: ORAL_TABLET | ORAL | 0 refills | Status: DC

## 2017-11-12 NOTE — Telephone Encounter (Signed)
After talking with Debbie Chan. (APP) she verbally request patient to be scheduled up to 10/24. Per 9/19 los.Printed avs and calender for 9/26 appointment. Other appointment was added after patient left and I (Angla) spoke with [facility]. Will mail a letter with a calender enclosed

## 2017-11-12 NOTE — Patient Instructions (Signed)
Stop Warfarin (Coumadin) Hold Acyclovir for 1 week (until after next lab)  Decrease Prednisone to 1.5 tablets daily for 1 week, then decrease to 1/2 tablet daily, then stop

## 2017-11-12 NOTE — Progress Notes (Signed)
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  PRIOR THERAPY:None  CURRENT THERAPY:Systemic therapy with Velcade 1.3 mg/M2 weekly, Revlimid 25 mg p.o. daily for 14 days every 3 weeks in addition to weekly Decadron 40 mg orally. First dose of treatment 10/08/2017.  Treatment was placed on hold due to development of a significant rash.  She will be resuming treatment with dexamethasone and Velcade on 11/19/2017.  She will not resume treatment of Revlimid due to the rash.  INTERVAL HISTORY: Debbie Chan 67 y.o. female returns for routine follow-up visit accompanied by her daughter-in-law.  The patient reports that she is feeling fine and has no specific complaints.  She remains on prednisone 50 mg daily.  Her rash has resolved at this point.  She has no itching.  She denies fevers and chills.  Denies chest pain, shortness breath, cough, hemoptysis.  Denies nausea, vomiting, constipation, diarrhea.  Denies recent weight loss or night sweats.  She has ongoing lower extremity edema.  She is here for evaluation and repeat lab work.  MEDICAL HISTORY:History reviewed. No pertinent past medical history.  ALLERGIES:  has No Known Allergies.  MEDICATIONS:  Current Outpatient Medications  Medication Sig Dispense Refill  . acyclovir (ZOVIRAX) 200 MG capsule Take 1 capsule (200 mg total) by mouth 2 (two) times daily. 60 capsule 2  . blood glucose meter kit and supplies Dispense based on patient and insurance preference. Use up to four times daily as directed. (FOR ICD-10 E10.9, E11.9). 1 each 0  . dexamethasone (DECADRON) 4 MG tablet Take 10 tablets (40 mg total) by mouth every 7 (seven) days. 40 tablet 3  . diphenhydrAMINE (BENADRYL ALLERGY) 25 MG tablet Take 1 tablet (25 mg total) by mouth every 6 (six) hours as needed for itching. 100 tablet 0  . furosemide (LASIX) 20 MG  tablet Take once daily for 3 days for lower extremity edema, then stop. Repeat as needed for recurrent edema. 30 tablet 2  . metoprolol tartrate (LOPRESSOR) 25 MG tablet Take 0.5 tablets (12.5 mg total) by mouth 2 (two) times daily. 30 tablet 0  . omeprazole (PRILOSEC) 40 MG capsule Take 1 capsule (40 mg total) by mouth daily. 30 capsule 2  . polyethylene glycol (MIRALAX / GLYCOLAX) packet Take 17 g by mouth daily. 14 each 0  . predniSONE (DELTASONE) 20 MG tablet Take 1 1/2 tabs daily X 7 days, then decrease to 1/2 tab daily X 7 days, then stop 40 tablet 0  . prochlorperazine (COMPAZINE) 10 MG tablet Take 1 tablet (10 mg total) by mouth every 6 (six) hours as needed for nausea or vomiting. 30 tablet 0  . traZODone (DESYREL) 50 MG tablet Take 1 tablet (50 mg total) by mouth at bedtime. 30 tablet 0   No current facility-administered medications for this visit.     SURGICAL HISTORY: History reviewed. No pertinent surgical history.  REVIEW OF SYSTEMS:   Review of Systems  Constitutional: Negative for appetite change, chills, fatigue, fever and unexpected weight change.  HENT:   Negative for mouth sores, nosebleeds, sore throat and trouble swallowing.   Eyes: Negative for eye problems and icterus.  Respiratory: Negative for cough, hemoptysis, shortness of breath and wheezing.   Cardiovascular: Negative for chest pain and leg swelling.  Gastrointestinal: Negative for abdominal pain, constipation, diarrhea, nausea and vomiting.  Genitourinary: Negative for bladder incontinence, difficulty urinating, dysuria, frequency and hematuria.  Musculoskeletal: Negative for back pain, gait problem, neck pain and neck stiffness.  Skin: Negative for itching and rash.  Neurological: Negative for dizziness, extremity weakness, gait problem, headaches, light-headedness and seizures.  Hematological: Negative for adenopathy. Does not bruise/bleed easily.  Psychiatric/Behavioral: Negative for confusion, depression  and sleep disturbance. The patient is not nervous/anxious.     PHYSICAL EXAMINATION:  Blood pressure (!) 150/81, pulse 77, temperature 98.4 F (36.9 C), temperature source Oral, resp. rate 18, height 5' 8" (1.727 m), weight 202 lb 3.2 oz (91.7 kg), SpO2 100 %.  ECOG PERFORMANCE STATUS: 1 - Symptomatic but completely ambulatory  Physical Exam  Constitutional: Oriented to person, place, and time and well-developed, well-nourished, and in no distress. No distress.  HENT:  Head: Normocephalic and atraumatic.  Mouth/Throat: Oropharynx is clear and moist. No oropharyngeal exudate.  Eyes: Conjunctivae are normal. Right eye exhibits no discharge. Left eye exhibits no discharge. No scleral icterus.  Neck: Normal range of motion. Neck supple.  Cardiovascular: Normal rate, regular rhythm, normal heart sounds and intact distal pulses.   Pulmonary/Chest: Effort normal and breath sounds normal. No respiratory distress. No wheezes. No rales.  Abdominal: Soft. Bowel sounds are normal. Exhibits no distension and no mass. There is no tenderness.  Musculoskeletal: Normal range of motion. Exhibits no edema.  Lymphadenopathy:    No cervical adenopathy.  Neurological: Alert and oriented to person, place, and time. Exhibits normal muscle tone. Gait normal. Coordination normal.  Skin: Skin is warm and dry. No rash noted. Not diaphoretic. No erythema. No pallor.  Psychiatric: Mood, memory and judgment normal.  Vitals reviewed.  LABORATORY DATA: Lab Results  Component Value Date   WBC 6.1 11/12/2017   HGB 11.7 11/12/2017   HCT 35.8 11/12/2017   MCV 91.7 11/12/2017   PLT 32 (L) 11/12/2017      Chemistry      Component Value Date/Time   NA 135 11/12/2017 1413   K 5.0 11/12/2017 1413   CL 101 11/12/2017 1413   CO2 26 11/12/2017 1413   BUN 21 11/12/2017 1413   CREATININE 1.34 (H) 11/12/2017 1413      Component Value Date/Time   CALCIUM 9.0 11/12/2017 1413   ALKPHOS 107 11/12/2017 1413   AST 19  11/12/2017 1413   ALT 22 11/12/2017 1413   BILITOT 0.7 11/12/2017 1413       RADIOGRAPHIC STUDIES:  No results found.   ASSESSMENT/PLAN:  Multiple myeloma without remission (HCC) This is a very pleasant 67 year old African female originally from [place] who is visiting her son in [place] and was recently diagnosed with IgA multiple myeloma. The patient was started on systemic therapy with Velcade 1.3 mg/M2 weekly in addition to Revlimid 25 mg p.o. daily for 14 days every 3 weeks as well as weekly Decadron 40 mg orally.   She developed significant rash and her treatment was placed on hold.  Rash is likely due to Revlimid. She is currently on prednisone 50 mg daily.  The patient was seen with Dr. Mohamed.  Her rash has resolved.  We discussed resuming her treatment with Velcade and dexamethasone however her platelet count is too low today to begin.  We will delay the start of her treatment by 1 week.   We will not resume Revlimid due to rash.  We will recheck her labs next week and consider treatment at that time.  She was instructed to discontinue the Coumadin and to hold her acyclovir for 1 week.  She   will continue to taper her prednisone.  She will begin 30 mg daily x1 week and then decrease to 10 mg daily for 1 week and then will stop.  She will be seen back for follow-up in approximately 2 weeks for evaluation prior to her Velcade.  The patient was advised to call immediately if she has any concerning symptoms in the interval. The patient voices understanding of current disease status and treatment options and is in agreement with the current care plan.  All questions were answered. The patient knows to call the clinic with any problems, questions or concerns. We can certainly see the patient much sooner if necessary.   No orders of the defined types were placed in this encounter.    Mikey Bussing, DNP, AGPCNP-BC, AOCNP 11/12/17    ADDENDUM: Hematology/Oncology Attending: I had a face-to-face encounter with the patient.  I recommended her care plan.  This is a very pleasant 67 years old African female from [place] who was recently diagnosed with multiple myeloma, IgA subtype.  The patient was undergoing treatment with systemic therapy with weekly subcutaneous Velcade, Revlimid 25 mg p.o. daily for 21 days every 4 weeks in addition to weekly Decadron.  The patient developed significant skin rash and this was felt to be likely secondary to her treatment with Revlimid.  Her treatment with Revlimid was discontinued and the patient was a started on very high dose of taper prednisone.  She is feeling much better and the skin rash completely resolved. I recommended for the patient to resume her treatment for the multiple myeloma but only with weekly subcutaneous Velcade as well as weekly Decadron. She will continue to taper her current dose of prednisone for now. For the thrombocytopenia this could be secondary to her multiple myeloma and we will resume her treatment for this condition.  We will continue to monitor her closely with weekly blood work.  We will also hold her dose of acyclovir for the next 2 weeks. We will see her back for follow-up visit in 2 weeks for evaluation. She was advised to call immediately if she has any concerning symptoms in the interval.  Disclaimer: This note was dictated with voice recognition software. Similar sounding words can inadvertently be transcribed and may be missed upon review. Eilleen Kempf, MD 11/15/17

## 2017-11-13 NOTE — Assessment & Plan Note (Signed)
This is a very pleasant 67 year old African female originally from [place] who is visiting her son in [place] and was recently diagnosed with IgA multiple myeloma. The patient was started on systemic therapy with Velcade 1.3 mg/M2 weekly in addition to Revlimid 25 mg p.o. daily for 14 days every 3 weeks as well as weekly Decadron 40 mg orally.   She developed significant rash and her treatment was placed on hold.  Rash is likely due to Revlimid. She is currently on prednisone 50 mg daily.  The patient was seen with Dr. Julien Nordmann.  Her rash has resolved.  We discussed resuming her treatment with Velcade and dexamethasone however her platelet count is too low today to begin.  We will delay the start of her treatment by 1 week.   We will not resume Revlimid due to rash.  We will recheck her labs next week and consider treatment at that time.  She was instructed to discontinue the Coumadin and to hold her acyclovir for 1 week.  She will continue to taper her prednisone.  She will begin 30 mg daily x1 week and then decrease to 10 mg daily for 1 week and then will stop.  She will be seen back for follow-up in approximately 2 weeks for evaluation prior to her Velcade.  The patient was advised to call immediately if she has any concerning symptoms in the interval. The patient voices understanding of current disease status and treatment options and is in agreement with the current care plan.  All questions were answered. The patient knows to call the clinic with any problems, questions or concerns. We can certainly see the patient much sooner if necessary.

## 2017-11-18 MED FILL — predniSONE 20 MG TABS: 20 | 30 days supply | Qty: 40 | Fill #0

## 2017-11-19 ENCOUNTER — Inpatient Hospital Stay: Payer: Self-pay

## 2017-11-19 ENCOUNTER — Inpatient Hospital Stay (HOSPITAL_BASED_OUTPATIENT_CLINIC_OR_DEPARTMENT_OTHER): Payer: Self-pay | Admitting: Medical

## 2017-11-19 VITALS — BP 142/87 | HR 82 | Temp 98.2°F | Resp 17

## 2017-11-19 DIAGNOSIS — C9000 Multiple myeloma not having achieved remission: Secondary | ICD-10-CM

## 2017-11-19 LAB — CMP (CANCER CENTER ONLY)
ALBUMIN: 3.4 g/dL — AB (ref 3.5–5.0)
ALT: 25 U/L (ref 0–44)
AST: 18 U/L (ref 15–41)
Alkaline Phosphatase: 89 U/L (ref 38–126)
Anion gap: 11 (ref 5–15)
BUN: 18 mg/dL (ref 8–23)
CO2: 25 mmol/L (ref 22–32)
CREATININE: 1.22 mg/dL — AB (ref 0.44–1.00)
Calcium: 8.8 mg/dL — ABNORMAL LOW (ref 8.9–10.3)
Chloride: 102 mmol/L (ref 98–111)
GFR, Est AFR Am: 52 mL/min — ABNORMAL LOW (ref >60)
GFR, Estimated: 45 mL/min — ABNORMAL LOW (ref >60)
GLUCOSE: 119 mg/dL — AB (ref 70–99)
Potassium: 4 mmol/L (ref 3.5–5.1)
Sodium: 138 mmol/L (ref 135–145)
Total Bilirubin: 0.8 mg/dL (ref 0.3–1.2)
Total Protein: 7.1 g/dL (ref 6.5–8.1)

## 2017-11-19 LAB — CBC WITH DIFFERENTIAL (CANCER CENTER ONLY)
Basophils Absolute: 0 10*3/uL (ref 0.0–0.1)
Basophils Relative: 0 %
EOS ABS: 0.6 10*3/uL — AB (ref 0.0–0.5)
EOS PCT: 8 %
HCT: 40.1 % (ref 34.8–46.6)
Hemoglobin: 13.1 g/dL (ref 11.6–15.9)
LYMPHS ABS: 1.9 10*3/uL (ref 0.9–3.3)
LYMPHS PCT: 25 %
MCH: 29.8 pg (ref 25.1–34.0)
MCHC: 32.7 g/dL (ref 31.5–36.0)
MCV: 91.3 fL (ref 79.5–101.0)
MONO ABS: 0.4 10*3/uL (ref 0.1–0.9)
Monocytes Relative: 5 %
Neutro Abs: 4.8 10*3/uL (ref 1.5–6.5)
Neutrophils Relative %: 62 %
PLATELETS: 78 10*3/uL — AB (ref 145–400)
RBC: 4.39 MIL/uL (ref 3.70–5.45)
RDW: 19.9 % — AB (ref 11.2–14.5)
WBC Count: 7.6 10*3/uL (ref 3.9–10.3)

## 2017-11-19 MED ORDER — PROCHLORPERAZINE MALEATE 10 MG PO TABS
ORAL_TABLET | ORAL | Status: AC
  Filled 2017-11-19: qty 1

## 2017-11-19 MED ORDER — OMEPRAZOLE 40 MG PO CPDR: 40.0000 mg | DELAYED_RELEASE_CAPSULE | Freq: Every day | ORAL | 2 refills | Status: DC

## 2017-11-19 MED ORDER — BORTEZOMIB CHEMO SQ INJECTION 3.5 MG (2.5MG/ML)
1.3000 mg/m2 | Freq: Once | INTRAMUSCULAR | Status: AC
  Administered 2017-11-19: 2.75 mg via SUBCUTANEOUS
  Filled 2017-11-19: qty 1.1

## 2017-11-19 MED ORDER — PROCHLORPERAZINE MALEATE 10 MG PO TABS
10.0000 mg | ORAL_TABLET | Freq: Once | ORAL | Status: AC
  Administered 2017-11-19: 10 mg via ORAL

## 2017-11-19 MED FILL — OMEPRAZOLE 40 MG CPDR: 40 | 30 days supply | Qty: 30 | Fill #0

## 2017-11-19 NOTE — Patient Instructions (Signed)
[facility] Discharge Instructions for Patients Receiving Chemotherapy  Today you received the following chemotherapy agents: Bortezomib (Velcade)  To help prevent nausea and vomiting after your treatment, we encourage you to take your nausea medication as directed.    If you develop nausea and vomiting that is not controlled by your nausea medication, call the clinic.   BELOW ARE SYMPTOMS THAT SHOULD BE REPORTED IMMEDIATELY:  *FEVER GREATER THAN 100.5 F  *CHILLS WITH OR WITHOUT FEVER  NAUSEA AND VOMITING THAT IS NOT CONTROLLED WITH YOUR NAUSEA MEDICATION  *UNUSUAL SHORTNESS OF BREATH  *UNUSUAL BRUISING OR BLEEDING  TENDERNESS IN MOUTH AND THROAT WITH OR WITHOUT PRESENCE OF ULCERS  *URINARY PROBLEMS  *BOWEL PROBLEMS  UNUSUAL RASH Items with * indicate a potential emergency and should be followed up as soon as possible.  Feel free to call the clinic should you have any questions or concerns. The clinic phone number is [phone].  Please show the [facility] at check-in to the Emergency Department and triage nurse.   Thrombocytopenia Thrombocytopenia means that you have a low number of platelets in your blood. Platelets are tiny cells in the blood. When you bleed, they clump together at the cut or injury to stop the bleeding. This is called blood clotting. Not having enough platelets can cause bleeding problems. Follow these instructions at home: General instructions  Check your skin and inside your mouth for bruises or blood as told by your doctor.  Check to see if there is blood in your spit (sputum), pee (urine), and poop (stool). Do this as told by your doctor.  Ask your doctor if you can drink alcohol.  Take over-the-counter and prescription medicines only as told by your doctor.  Tell all of your doctors that you have this condition. Be sure to tell your dentist and eye doctor too. Activity  Do not do activities that can cause bumps or  bruises until your doctor says it is okay.  Be careful not to cut yourself: ? When you shave. ? When you use scissors, needles, knives, or other tools.  Be careful not to burn yourself: ? When you use an iron. ? When you cook. Contact a doctor if:  You have bruises and you do not know why. Get help right away if:  You are bleeding anywhere on your body.  You have blood in your spit, pee, or poop. This information is not intended to replace advice given to you by your health care provider. Make sure you discuss any questions you have with your health care provider. Document Released: 01/30/2011 Document Revised: 10/14/2015 Document Reviewed: 08/14/2014 Elsevier Interactive Patient Education  [employer].

## 2017-11-19 NOTE — Progress Notes (Signed)
Velcade Injection 11/19/2017: per Mikey Bussing, NP ok to treat with platelets of 78

## 2017-11-20 ENCOUNTER — Other Ambulatory Visit: Payer: Self-pay

## 2017-11-20 ENCOUNTER — Ambulatory Visit: Payer: Self-pay

## 2017-11-20 ENCOUNTER — Ambulatory Visit: Payer: Self-pay | Admitting: Oncology

## 2017-11-20 NOTE — Progress Notes (Signed)
This provider was asked to see the patient in the infusion room and to review her medications with her and her daughter.

## 2017-11-26 ENCOUNTER — Encounter: Payer: Self-pay | Admitting: Oncology

## 2017-11-26 ENCOUNTER — Inpatient Hospital Stay (HOSPITAL_BASED_OUTPATIENT_CLINIC_OR_DEPARTMENT_OTHER): Payer: Self-pay | Admitting: Oncology

## 2017-11-26 ENCOUNTER — Inpatient Hospital Stay: Payer: Self-pay

## 2017-11-26 ENCOUNTER — Inpatient Hospital Stay: Payer: Self-pay | Attending: Internal Medicine

## 2017-11-26 VITALS — BP 129/73 | HR 88 | Temp 98.3°F | Resp 18 | Ht 68.0 in | Wt 183.5 lb

## 2017-11-26 DIAGNOSIS — M25552 Pain in left hip: Secondary | ICD-10-CM

## 2017-11-26 DIAGNOSIS — M25551 Pain in right hip: Secondary | ICD-10-CM

## 2017-11-26 DIAGNOSIS — R21 Rash and other nonspecific skin eruption: Secondary | ICD-10-CM | POA: Insufficient documentation

## 2017-11-26 DIAGNOSIS — Z7952 Long term (current) use of systemic steroids: Secondary | ICD-10-CM | POA: Insufficient documentation

## 2017-11-26 DIAGNOSIS — Z5112 Encounter for antineoplastic immunotherapy: Secondary | ICD-10-CM | POA: Insufficient documentation

## 2017-11-26 DIAGNOSIS — C9000 Multiple myeloma not having achieved remission: Secondary | ICD-10-CM | POA: Insufficient documentation

## 2017-11-26 DIAGNOSIS — Z5111 Encounter for antineoplastic chemotherapy: Secondary | ICD-10-CM

## 2017-11-26 LAB — CMP (CANCER CENTER ONLY)
ALT: 20 U/L (ref 0–44)
AST: 19 U/L (ref 15–41)
Albumin: 3.4 g/dL — ABNORMAL LOW (ref 3.5–5.0)
Alkaline Phosphatase: 97 U/L (ref 38–126)
Anion gap: 9 (ref 5–15)
BUN: 14 mg/dL (ref 8–23)
CHLORIDE: 99 mmol/L (ref 98–111)
CO2: 24 mmol/L (ref 22–32)
CREATININE: 1.29 mg/dL — AB (ref 0.44–1.00)
Calcium: 8.9 mg/dL (ref 8.9–10.3)
GFR, EST AFRICAN AMERICAN: 49 mL/min — AB (ref >60)
GFR, EST NON AFRICAN AMERICAN: 42 mL/min — AB (ref >60)
Glucose, Bld: 231 mg/dL — ABNORMAL HIGH (ref 70–99)
Potassium: 5 mmol/L (ref 3.5–5.1)
Sodium: 132 mmol/L — ABNORMAL LOW (ref 135–145)
Total Bilirubin: 0.5 mg/dL (ref 0.3–1.2)
Total Protein: 7.4 g/dL (ref 6.5–8.1)

## 2017-11-26 LAB — CBC WITH DIFFERENTIAL (CANCER CENTER ONLY)
Basophils Absolute: 0 10*3/uL (ref 0.0–0.1)
Basophils Relative: 1 %
EOS ABS: 0 10*3/uL (ref 0.0–0.5)
Eosinophils Relative: 1 %
HCT: 39.4 % (ref 34.8–46.6)
Hemoglobin: 12.8 g/dL (ref 11.6–15.9)
LYMPHS ABS: 0.7 10*3/uL — AB (ref 0.9–3.3)
Lymphocytes Relative: 14 %
MCH: 29.7 pg (ref 25.1–34.0)
MCHC: 32.5 g/dL (ref 31.5–36.0)
MCV: 91.2 fL (ref 79.5–101.0)
Monocytes Absolute: 0.1 10*3/uL (ref 0.1–0.9)
Monocytes Relative: 2 %
Neutro Abs: 4.2 10*3/uL (ref 1.5–6.5)
Neutrophils Relative %: 82 %
Platelet Count: 111 10*3/uL — ABNORMAL LOW (ref 145–400)
RBC: 4.32 MIL/uL (ref 3.70–5.45)
RDW: 20.4 % — ABNORMAL HIGH (ref 11.2–14.5)
WBC Count: 5.1 10*3/uL (ref 3.9–10.3)

## 2017-11-26 MED ORDER — PROCHLORPERAZINE MALEATE 10 MG PO TABS
ORAL_TABLET | ORAL | Status: AC
  Filled 2017-11-26: qty 1

## 2017-11-26 MED ORDER — DEXAMETHASONE 4 MG PO TABS: 40.0000 mg | ORAL_TABLET | ORAL | 3 refills | Status: DC

## 2017-11-26 MED ORDER — PROCHLORPERAZINE MALEATE 10 MG PO TABS: 10.0000 mg | ORAL_TABLET | Freq: Once | ORAL | Status: DC

## 2017-11-26 MED ORDER — BORTEZOMIB CHEMO SQ INJECTION 3.5 MG (2.5MG/ML)
1.3000 mg/m2 | Freq: Once | INTRAMUSCULAR | Status: AC
  Administered 2017-11-26: 2.75 mg via SUBCUTANEOUS
  Filled 2017-11-26: qty 1.1

## 2017-11-26 NOTE — Progress Notes (Signed)
Pt took compazine at 12:30pm at home. D/C'd compazine from the treatment plan today.  Hardie Pulley, PharmD, BCPS, BCOP

## 2017-11-26 NOTE — Assessment & Plan Note (Addendum)
This is a very pleasant 67 year old African female originally from [place] who is visiting her son in [place] and was recently diagnosed with IgA multiple myeloma. The patient was started onsystemic therapy with Velcade 1.3 mg/M2 weekly in addition to Revlimid 25 mg p.o. daily for 14 days every 3 weeks as well as weekly Decadron 40 mg orally.She developed significant rash and her treatment was placed on hold. Rash is likely due to Revlimid.  She was treated with a tapering dose of prednisone and remains on 10 mg daily.  She is due to take her last dose of prednisone tomorrow (11/27/2017). Labs from today have been reviewed.  Platelet count has improved to 111,000.  All other labs are stable.  Recommend for her to proceed with her Velcade today as scheduled.  I again reviewed with the patient that she needs to take her dexamethasone 40 mg once a week.  We also discussed that tomorrow will be her last dose of prednisone and that she should stop the medication after she takes that dose.  These instructions were written down for the patient.  The patient will return weekly for labs and Velcade.  She will have a follow-up visit in about 3 weeks for evaluation prior to her Velcade.  The patient was advised to call immediately if she has any concerning symptoms in the interval. The patient voices understanding of current disease status and treatment options and is in agreement with the current care plan.  All questions were answered. The patient knows to call the clinic with any problems, questions or concerns. We can certainly see the patient much sooner if necessary.

## 2017-11-26 NOTE — Patient Instructions (Signed)
[facility] Discharge Instructions for Patients Receiving Chemotherapy  Today you received the following chemotherapy agents: Bortezomib (Velcade)  To help prevent nausea and vomiting after your treatment, we encourage you to take your nausea medication  as prescribed.    If you develop nausea and vomiting that is not controlled by your nausea medication, call the clinic.   BELOW ARE SYMPTOMS THAT SHOULD BE REPORTED IMMEDIATELY:  *FEVER GREATER THAN 100.5 F  *CHILLS WITH OR WITHOUT FEVER  NAUSEA AND VOMITING THAT IS NOT CONTROLLED WITH YOUR NAUSEA MEDICATION  *UNUSUAL SHORTNESS OF BREATH  *UNUSUAL BRUISING OR BLEEDING  TENDERNESS IN MOUTH AND THROAT WITH OR WITHOUT PRESENCE OF ULCERS  *URINARY PROBLEMS  *BOWEL PROBLEMS  UNUSUAL RASH Items with * indicate a potential emergency and should be followed up as soon as possible.  Feel free to call the clinic should you have any questions or concerns. The clinic phone number is [phone].  Please show the CHEMO ALERT CARD at check-in to the Emergency Department and triage nurse.   

## 2017-11-26 NOTE — Progress Notes (Signed)
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  PRIOR THERAPY:None  CURRENT THERAPY:Systemic therapy with Velcade 1.3 mg/M2 weekly, Revlimid 25 mg p.o. daily for 14 days every 3 weeks in addition to weekly Decadron 40 mg orally. First dose of treatment 10/08/2017.Treatment was placed on hold due to development of a significant rash.  She resumed treatment with dexamethasone and Velcade on 11/19/2017.  She will not resume treatment of Revlimid due to the rash.  INTERVAL HISTORY: Debbie Chan 67 y.o. female returns for routine follow-up visit by herself.  The patient reports that she is having fatigue and generalized arthralgias.  She notices discomfort in her legs when she stands up.  She denies fevers and chills.  Denies chest pain, shortness of breath, cough, hemoptysis.  Denies nausea, vomiting, constipation, diarrhea. No recurrent rashes.  She remains on prednisone 10 mg daily and due to take her last dose tomorrow.  She resumed treatment with Velcade last week and overall tolerated it fairly well.  The patient is here for evaluation and repeat lab work.  MEDICAL HISTORY:History reviewed. No pertinent past medical history.  ALLERGIES:  has No Known Allergies.  MEDICATIONS:  Current Outpatient Medications  Medication Sig Dispense Refill  . acyclovir (ZOVIRAX) 200 MG capsule Take 1 capsule (200 mg total) by mouth 2 (two) times daily. 60 capsule 2  . dexamethasone (DECADRON) 4 MG tablet Take 10 tablets (40 mg total) by mouth every 7 (seven) days. 40 tablet 3  . furosemide (LASIX) 20 MG tablet Take once daily for 3 days for lower extremity edema, then stop. Repeat as needed for recurrent edema. 30 tablet 2  . metoprolol tartrate (LOPRESSOR) 25 MG tablet Take 0.5 tablets (12.5 mg total) by mouth 2 (two) times daily. 30 tablet 0  . omeprazole (PRILOSEC) 40 MG  capsule Take 1 capsule (40 mg total) by mouth daily. 30 capsule 2  . predniSONE (DELTASONE) 20 MG tablet Take 1 1/2 tabs daily X 7 days, then decrease to 1/2 tab daily X 7 days, then stop 40 tablet 0  . prochlorperazine (COMPAZINE) 10 MG tablet Take 1 tablet (10 mg total) by mouth every 6 (six) hours as needed for nausea or vomiting. 30 tablet 0  . blood glucose meter kit and supplies Dispense based on patient and insurance preference. Use up to four times daily as directed. (FOR ICD-10 E10.9, E11.9). (Patient not taking: Reported on 11/26/2017) 1 each 0  . diphenhydrAMINE (BENADRYL ALLERGY) 25 MG tablet Take 1 tablet (25 mg total) by mouth every 6 (six) hours as needed for itching. (Patient not taking: Reported on 11/26/2017) 100 tablet 0  . polyethylene glycol (MIRALAX / GLYCOLAX) packet Take 17 g by mouth daily. (Patient not taking: Reported on 11/26/2017) 14 each 0  . traZODone (DESYREL) 50 MG tablet Take 1 tablet (50 mg total) by mouth at bedtime. 30 tablet 0   No current facility-administered medications for this visit.     SURGICAL HISTORY: History reviewed. No pertinent surgical history.  REVIEW OF SYSTEMS:   Review of Systems  Constitutional: Negative for appetite change, chills, fever.  Positive for fatigue and generalized weakness. HENT:   Negative for mouth sores, nosebleeds, sore throat and trouble swallowing.   Eyes: Negative for eye problems and icterus.  Respiratory: Negative for cough, hemoptysis, shortness of breath and wheezing.   Cardiovascular: Negative for chest pain and leg swelling.  Gastrointestinal: Negative  for abdominal pain, constipation, diarrhea, nausea and vomiting.  Genitourinary: Negative for bladder incontinence, difficulty urinating, dysuria, frequency and hematuria.   Musculoskeletal: Negative for back pain, gait problem, neck pain and neck stiffness.  Positive for generalized arthralgias particularly in her legs. Skin: Negative for itching and rash.   Neurological: Negative for dizziness, extremity weakness, gait problem, headaches, light-headedness and seizures.  Hematological: Negative for adenopathy. Does not bruise/bleed easily.  Psychiatric/Behavioral: Negative for confusion, depression and sleep disturbance. The patient is not nervous/anxious.     PHYSICAL EXAMINATION:  Blood pressure 129/73, pulse 88, temperature 98.3 F (36.8 C), temperature source Oral, resp. rate 18, height '5\' 8"'  (1.727 m), weight 183 lb 8 oz (83.2 kg), SpO2 98 %.  ECOG PERFORMANCE STATUS: 1 - Symptomatic but completely ambulatory  Physical Exam  Constitutional: Oriented to person, place, and time and well-developed, well-nourished, and in no distress. No distress.  HENT:  Head: Normocephalic and atraumatic.  Mouth/Throat: Oropharynx is clear and moist. No oropharyngeal exudate.  Eyes: Conjunctivae are normal. Right eye exhibits no discharge. Left eye exhibits no discharge. No scleral icterus.  Neck: Normal range of motion. Neck supple.  Cardiovascular: Normal rate, regular rhythm, normal heart sounds and intact distal pulses.   Pulmonary/Chest: Effort normal and breath sounds normal. No respiratory distress. No wheezes. No rales.  Abdominal: Soft. Bowel sounds are normal. Exhibits no distension and no mass. There is no tenderness.  Musculoskeletal: Normal range of motion. Exhibits no edema.  Lymphadenopathy:    No cervical adenopathy.  Neurological: Alert and oriented to person, place, and time. Exhibits normal muscle tone. Gait normal. Coordination normal.  Skin: Skin is warm and dry. No rash noted. Not diaphoretic. No erythema. No pallor.  Psychiatric: Mood, memory and judgment normal.  Vitals reviewed.  LABORATORY DATA: Lab Results  Component Value Date   WBC 5.1 11/26/2017   HGB 12.8 11/26/2017   HCT 39.4 11/26/2017   MCV 91.2 11/26/2017   PLT 111 (L) 11/26/2017      Chemistry      Component Value Date/Time   NA 132 (L) 11/26/2017 1322    K 5.0 11/26/2017 1322   CL 99 11/26/2017 1322   CO2 24 11/26/2017 1322   BUN 14 11/26/2017 1322   CREATININE 1.29 (H) 11/26/2017 1322      Component Value Date/Time   CALCIUM 8.9 11/26/2017 1322   ALKPHOS 97 11/26/2017 1322   AST 19 11/26/2017 1322   ALT 20 11/26/2017 1322   BILITOT 0.5 11/26/2017 1322       RADIOGRAPHIC STUDIES:  No results found.   ASSESSMENT/PLAN:  Multiple myeloma without remission (HCC) This is a very pleasant 67 year old African female originally from [place] who is visiting her son in [place] and was recently diagnosed with IgA multiple myeloma. The patient was started onsystemic therapy with Velcade 1.3 mg/M2 weekly in addition to Revlimid 25 mg p.o. daily for 14 days every 3 weeks as well as weekly Decadron 40 mg orally.She developed significant rash and her treatment was placed on hold. Rash is likely due to Revlimid.  She was treated with a tapering dose of prednisone and remains on 10 mg daily.  She is due to take her last dose of prednisone tomorrow (11/27/2017). Labs from today have been reviewed.  Platelet count has improved to 111,000.  All other labs are stable.  Recommend for her to proceed with her Velcade today as scheduled.  I again reviewed with the patient that she needs  to take her dexamethasone 40 mg once a week.  We also discussed that tomorrow will be her last dose of prednisone and that she should stop the medication after she takes that dose.  These instructions were written down for the patient.  The patient will return weekly for labs and Velcade.  She will have a follow-up visit in about 3 weeks for evaluation prior to her Velcade.  The patient was advised to call immediately if she has any concerning symptoms in the interval. The patient voices understanding of current disease status and treatment options and is in agreement with the current care plan.  All questions were answered. The patient knows to call the  clinic with any problems, questions or concerns. We can certainly see the patient much sooner if necessary.  Orders Placed This Encounter  Procedures  . IgG, IgA, IgM    Standing Status:   Future    Standing Expiration Date:   11/27/2018  . Kappa/lambda light chains    Standing Status:   Future    Standing Expiration Date:   11/27/2018  . Multiple Myeloma Panel (SPEP&IFE w/QIG)    Standing Status:   Future    Standing Expiration Date:   11/27/2018     Mikey Bussing, DNP, AGPCNP-BC, AOCNP 11/27/17

## 2017-11-27 MED FILL — DEXAMETHASONE 4 MG TABLET: 4 | 28 days supply | Qty: 40 | Fill #1

## 2017-12-03 ENCOUNTER — Inpatient Hospital Stay: Payer: Self-pay

## 2017-12-03 ENCOUNTER — Ambulatory Visit (HOSPITAL_BASED_OUTPATIENT_CLINIC_OR_DEPARTMENT_OTHER): Payer: Self-pay | Admitting: Medical

## 2017-12-03 ENCOUNTER — Telehealth: Payer: Self-pay | Admitting: Internal Medicine

## 2017-12-03 VITALS — BP 145/85 | HR 90 | Temp 98.5°F | Resp 17

## 2017-12-03 DIAGNOSIS — M25551 Pain in right hip: Secondary | ICD-10-CM

## 2017-12-03 DIAGNOSIS — M25552 Pain in left hip: Secondary | ICD-10-CM

## 2017-12-03 DIAGNOSIS — C9000 Multiple myeloma not having achieved remission: Secondary | ICD-10-CM

## 2017-12-03 LAB — CBC WITH DIFFERENTIAL (CANCER CENTER ONLY)
Abs Immature Granulocytes: 0.02 10*3/uL (ref 0.00–0.07)
Basophils Absolute: 0 10*3/uL (ref 0.0–0.1)
Basophils Relative: 0 %
Eosinophils Absolute: 0.1 10*3/uL (ref 0.0–0.5)
Eosinophils Relative: 2 %
HCT: 38.4 % (ref 36.0–46.0)
HEMOGLOBIN: 12.7 g/dL (ref 12.0–15.0)
Immature Granulocytes: 0 %
LYMPHS ABS: 1.1 10*3/uL (ref 0.7–4.0)
LYMPHS PCT: 20 %
MCH: 29.3 pg (ref 26.0–34.0)
MCHC: 33.1 g/dL (ref 30.0–36.0)
MCV: 88.5 fL (ref 80.0–100.0)
MONO ABS: 0.2 10*3/uL (ref 0.1–1.0)
MONOS PCT: 3 %
NEUTROS ABS: 4 10*3/uL (ref 1.7–7.7)
Neutrophils Relative %: 75 %
Platelet Count: 94 10*3/uL — ABNORMAL LOW (ref 150–400)
RBC: 4.34 MIL/uL (ref 3.87–5.11)
RDW: 17.9 % — ABNORMAL HIGH (ref 11.5–15.5)
WBC Count: 5.3 10*3/uL (ref 4.0–10.5)
nRBC: 0 % (ref 0.0–0.2)

## 2017-12-03 LAB — CMP (CANCER CENTER ONLY)
ALBUMIN: 3.3 g/dL — AB (ref 3.5–5.0)
ALT: 15 U/L (ref 0–44)
AST: 19 U/L (ref 15–41)
Alkaline Phosphatase: 105 U/L (ref 38–126)
Anion gap: 11 (ref 5–15)
BUN: 16 mg/dL (ref 8–23)
CO2: 23 mmol/L (ref 22–32)
Calcium: 9.5 mg/dL (ref 8.9–10.3)
Chloride: 104 mmol/L (ref 98–111)
Creatinine: 1.17 mg/dL — ABNORMAL HIGH (ref 0.44–1.00)
GFR, EST NON AFRICAN AMERICAN: 47 mL/min — AB (ref >60)
GFR, Est AFR Am: 55 mL/min — ABNORMAL LOW (ref >60)
GLUCOSE: 113 mg/dL — AB (ref 70–99)
POTASSIUM: 4.3 mmol/L (ref 3.5–5.1)
Sodium: 138 mmol/L (ref 135–145)
Total Bilirubin: 0.6 mg/dL (ref 0.3–1.2)
Total Protein: 7.5 g/dL (ref 6.5–8.1)

## 2017-12-03 MED ORDER — BORTEZOMIB CHEMO SQ INJECTION 3.5 MG (2.5MG/ML)
1.3000 mg/m2 | Freq: Once | INTRAMUSCULAR | Status: AC
  Administered 2017-12-03: 2.75 mg via SUBCUTANEOUS
  Filled 2017-12-03: qty 1.1

## 2017-12-03 MED ORDER — PROCHLORPERAZINE MALEATE 10 MG PO TABS: 10.0000 mg | ORAL_TABLET | Freq: Once | ORAL | Status: DC

## 2017-12-03 MED ORDER — ACETAMINOPHEN 325 MG PO TABS
650.0000 mg | ORAL_TABLET | Freq: Once | ORAL | Status: AC
  Administered 2017-12-03: 650 mg via ORAL

## 2017-12-03 MED ORDER — ACETAMINOPHEN 325 MG PO TABS
ORAL_TABLET | ORAL | Status: AC
  Filled 2017-12-03: qty 2

## 2017-12-03 NOTE — Progress Notes (Signed)
Pt stated new worse pain in bilateral hips radiating to waist and legs. Sandi Mealy PA to infusion to assess.

## 2017-12-03 NOTE — Progress Notes (Signed)
Ok to treat despite labs  

## 2017-12-03 NOTE — Patient Instructions (Signed)
[facility]cer Center Discharge Instructions for Patients Receiving Chemotherapy  Today you received the following chemotherapy agents: Bortezomib (Velcade)  To help prevent nausea and vomiting after your treatment, we encourage you to take your nausea medication  as prescribed.    If you develop nausea and vomiting that is not controlled by your nausea medication, call the clinic.   BELOW ARE SYMPTOMS THAT SHOULD BE REPORTED IMMEDIATELY:  *FEVER GREATER THAN 100.5 F  *CHILLS WITH OR WITHOUT FEVER  NAUSEA AND VOMITING THAT IS NOT CONTROLLED WITH YOUR NAUSEA MEDICATION  *UNUSUAL SHORTNESS OF BREATH  *UNUSUAL BRUISING OR BLEEDING  TENDERNESS IN MOUTH AND THROAT WITH OR WITHOUT PRESENCE OF ULCERS  *URINARY PROBLEMS  *BOWEL PROBLEMS  UNUSUAL RASH Items with * indicate a potential emergency and should be followed up as soon as possible.  Feel free to call the clinic should you have any questions or concerns. The clinic phone number is [phone].  Please show the CHEMO ALERT CARD at check-in to the Emergency Department and triage nurse.   

## 2017-12-03 NOTE — Telephone Encounter (Signed)
Adjusted appt from 10/3 los - so follow ups are every 4 weeks instead of 3 - gave patient an updated calender and wrote my direct number on the calender for patient incase her daughter had any questions.

## 2017-12-04 NOTE — Progress Notes (Signed)
I was asked to see Debbie Chan in the infusion room today.  She reports that she has been having bilateral posterior hip and posterior proximal thigh pain especially after sitting for extended periods of time.  She has not been taking any medication for these symptoms.  Her most recent labs were reviewed.  She has no known allergies.  She was instructed to begin using Tylenol 650 mg 3-4 times daily.  She was given Tylenol 650 mg p.o. x1 today.  She expressed understanding and agreement with this.  Sandi Mealy, MHS, PA-C Physician Assistant

## 2017-12-10 ENCOUNTER — Inpatient Hospital Stay: Payer: Self-pay

## 2017-12-10 ENCOUNTER — Ambulatory Visit: Payer: Self-pay

## 2017-12-10 ENCOUNTER — Other Ambulatory Visit: Payer: Self-pay

## 2017-12-10 VITALS — BP 148/92 | HR 86 | Temp 98.3°F | Resp 17 | Wt 185.8 lb

## 2017-12-10 DIAGNOSIS — C9000 Multiple myeloma not having achieved remission: Secondary | ICD-10-CM

## 2017-12-10 LAB — CMP (CANCER CENTER ONLY)
ALBUMIN: 3.5 g/dL (ref 3.5–5.0)
ALT: 16 U/L (ref 0–44)
AST: 20 U/L (ref 15–41)
Alkaline Phosphatase: 111 U/L (ref 38–126)
Anion gap: 10 (ref 5–15)
BUN: 15 mg/dL (ref 8–23)
CHLORIDE: 104 mmol/L (ref 98–111)
CO2: 27 mmol/L (ref 22–32)
CREATININE: 1.17 mg/dL — AB (ref 0.44–1.00)
Calcium: 9.5 mg/dL (ref 8.9–10.3)
GFR, EST NON AFRICAN AMERICAN: 47 mL/min — AB (ref >60)
GFR, Est AFR Am: 55 mL/min — ABNORMAL LOW (ref >60)
GLUCOSE: 71 mg/dL (ref 70–99)
POTASSIUM: 4.3 mmol/L (ref 3.5–5.1)
SODIUM: 141 mmol/L (ref 135–145)
Total Bilirubin: 0.5 mg/dL (ref 0.3–1.2)
Total Protein: 7.4 g/dL (ref 6.5–8.1)

## 2017-12-10 LAB — CBC WITH DIFFERENTIAL (CANCER CENTER ONLY)
Abs Immature Granulocytes: 0.01 10*3/uL (ref 0.00–0.07)
BASOS ABS: 0 10*3/uL (ref 0.0–0.1)
BASOS PCT: 1 %
EOS ABS: 0.1 10*3/uL (ref 0.0–0.5)
Eosinophils Relative: 1 %
HCT: 38.5 % (ref 36.0–46.0)
Hemoglobin: 12.7 g/dL (ref 12.0–15.0)
IMMATURE GRANULOCYTES: 0 %
Lymphocytes Relative: 28 %
Lymphs Abs: 1.2 10*3/uL (ref 0.7–4.0)
MCH: 29.4 pg (ref 26.0–34.0)
MCHC: 33 g/dL (ref 30.0–36.0)
MCV: 89.1 fL (ref 80.0–100.0)
MONOS PCT: 4 %
Monocytes Absolute: 0.2 10*3/uL (ref 0.1–1.0)
NEUTROS ABS: 2.8 10*3/uL (ref 1.7–7.7)
NEUTROS PCT: 66 %
NRBC: 0 % (ref 0.0–0.2)
PLATELETS: 106 10*3/uL — AB (ref 150–400)
RBC: 4.32 MIL/uL (ref 3.87–5.11)
RDW: 17.9 % — AB (ref 11.5–15.5)
WBC Count: 4.2 10*3/uL (ref 4.0–10.5)

## 2017-12-10 MED ORDER — TBO-FILGRASTIM 480 MCG/0.8ML ~~LOC~~ SOSY
PREFILLED_SYRINGE | SUBCUTANEOUS | Status: AC
  Filled 2017-12-10: qty 0.8

## 2017-12-10 MED ORDER — PROCHLORPERAZINE MALEATE 10 MG PO TABS: 10.0000 mg | ORAL_TABLET | Freq: Once | ORAL | Status: DC

## 2017-12-10 MED ORDER — BORTEZOMIB CHEMO SQ INJECTION 3.5 MG (2.5MG/ML)
1.3000 mg/m2 | Freq: Once | INTRAMUSCULAR | Status: AC
  Administered 2017-12-10: 2.75 mg via SUBCUTANEOUS
  Filled 2017-12-10: qty 1.1

## 2017-12-10 NOTE — Patient Instructions (Signed)
[facility] Discharge Instructions for Patients Receiving Chemotherapy  Today you received the following chemotherapy agents Velcade.  To help prevent nausea and vomiting after your treatment, we encourage you to take your nausea medication as directed.  If you develop nausea and vomiting that is not controlled by your nausea medication, call the clinic.   BELOW ARE SYMPTOMS THAT SHOULD BE REPORTED IMMEDIATELY:  *FEVER GREATER THAN 100.5 F  *CHILLS WITH OR WITHOUT FEVER  NAUSEA AND VOMITING THAT IS NOT CONTROLLED WITH YOUR NAUSEA MEDICATION  *UNUSUAL SHORTNESS OF BREATH  *UNUSUAL BRUISING OR BLEEDING  TENDERNESS IN MOUTH AND THROAT WITH OR WITHOUT PRESENCE OF ULCERS  *URINARY PROBLEMS  *BOWEL PROBLEMS  UNUSUAL RASH Items with * indicate a potential emergency and should be followed up as soon as possible.  Feel free to call the clinic should you have any questions or concerns. The clinic phone number is [phone].  Please show the CHEMO ALERT CARD at check-in to the Emergency Department and triage nurse.   

## 2017-12-11 LAB — KAPPA/LAMBDA LIGHT CHAINS
KAPPA FREE LGHT CHN: 8.1 mg/L (ref 3.3–19.4)
Kappa, lambda light chain ratio: 0.02 — ABNORMAL LOW (ref 0.26–1.65)
LAMDA FREE LIGHT CHAINS: 348.1 mg/L — AB (ref 5.7–26.3)

## 2017-12-11 LAB — IGG, IGA, IGM
IgA: 1805 mg/dL — ABNORMAL HIGH (ref 87–352)
IgG (Immunoglobin G), Serum: 481 mg/dL — ABNORMAL LOW (ref 700–1600)
IgM (Immunoglobulin M), Srm: 17 mg/dL — ABNORMAL LOW (ref 26–217)

## 2017-12-12 LAB — MULTIPLE MYELOMA PANEL, SERUM
ALBUMIN/GLOB SERPL: 1.2 (ref 0.7–1.7)
ALPHA 1: 0.3 g/dL (ref 0.0–0.4)
ALPHA2 GLOB SERPL ELPH-MCNC: 0.5 g/dL (ref 0.4–1.0)
Albumin SerPl Elph-Mcnc: 3.7 g/dL (ref 2.9–4.4)
B-Globulin SerPl Elph-Mcnc: 2 g/dL — ABNORMAL HIGH (ref 0.7–1.3)
GAMMA GLOB SERPL ELPH-MCNC: 0.3 g/dL — AB (ref 0.4–1.8)
GLOBULIN, TOTAL: 3.2 g/dL (ref 2.2–3.9)
IGA: 1817 mg/dL — AB (ref 87–352)
IgG (Immunoglobin G), Serum: 470 mg/dL — ABNORMAL LOW (ref 700–1600)
IgM (Immunoglobulin M), Srm: 16 mg/dL — ABNORMAL LOW (ref 26–217)
M Protein SerPl Elph-Mcnc: 1.2 g/dL — ABNORMAL HIGH
Total Protein ELP: 6.9 g/dL (ref 6.0–8.5)

## 2017-12-17 ENCOUNTER — Ambulatory Visit: Payer: Self-pay | Admitting: Oncology

## 2017-12-17 ENCOUNTER — Other Ambulatory Visit: Payer: Self-pay | Admitting: Emergency Medicine

## 2017-12-17 ENCOUNTER — Inpatient Hospital Stay: Payer: Self-pay

## 2017-12-17 ENCOUNTER — Ambulatory Visit (HOSPITAL_COMMUNITY)
Admission: RE | Admit: 2017-12-17 | Discharge: 2017-12-17 | Disposition: A | Discharge status: Home / Self Care | Payer: Self-pay | Source: Ambulatory Visit | Attending: Medical | Admitting: Medical

## 2017-12-17 ENCOUNTER — Telehealth: Payer: Self-pay | Admitting: Medical

## 2017-12-17 ENCOUNTER — Inpatient Hospital Stay (HOSPITAL_BASED_OUTPATIENT_CLINIC_OR_DEPARTMENT_OTHER): Payer: Self-pay | Admitting: Medical

## 2017-12-17 ENCOUNTER — Ambulatory Visit: Payer: Self-pay

## 2017-12-17 ENCOUNTER — Other Ambulatory Visit: Payer: Self-pay

## 2017-12-17 VITALS — BP 147/88 | HR 80 | Temp 98.1°F | Resp 18 | Wt 197.4 lb

## 2017-12-17 DIAGNOSIS — M25551 Pain in right hip: Secondary | ICD-10-CM

## 2017-12-17 DIAGNOSIS — M25552 Pain in left hip: Secondary | ICD-10-CM | POA: Insufficient documentation

## 2017-12-17 DIAGNOSIS — C9000 Multiple myeloma not having achieved remission: Secondary | ICD-10-CM

## 2017-12-17 LAB — CMP (CANCER CENTER ONLY)
ALT: 15 U/L (ref 0–44)
AST: 20 U/L (ref 15–41)
Albumin: 3.5 g/dL (ref 3.5–5.0)
Alkaline Phosphatase: 106 U/L (ref 38–126)
Anion gap: 11 (ref 5–15)
BILIRUBIN TOTAL: 0.5 mg/dL (ref 0.3–1.2)
BUN: 14 mg/dL (ref 8–23)
CO2: 24 mmol/L (ref 22–32)
CREATININE: 1.35 mg/dL — AB (ref 0.44–1.00)
Calcium: 9.2 mg/dL (ref 8.9–10.3)
Chloride: 105 mmol/L (ref 98–111)
GFR, EST AFRICAN AMERICAN: 46 mL/min — AB (ref >60)
GFR, EST NON AFRICAN AMERICAN: 40 mL/min — AB (ref >60)
Glucose, Bld: 84 mg/dL (ref 70–99)
POTASSIUM: 4.4 mmol/L (ref 3.5–5.1)
Sodium: 140 mmol/L (ref 135–145)
TOTAL PROTEIN: 7 g/dL (ref 6.5–8.1)

## 2017-12-17 LAB — CBC WITH DIFFERENTIAL (CANCER CENTER ONLY)
Abs Immature Granulocytes: 0.01 10*3/uL (ref 0.00–0.07)
Basophils Absolute: 0 10*3/uL (ref 0.0–0.1)
Basophils Relative: 0 %
Eosinophils Absolute: 0 10*3/uL (ref 0.0–0.5)
Eosinophils Relative: 1 %
HCT: 38.5 % (ref 36.0–46.0)
Hemoglobin: 12.7 g/dL (ref 12.0–15.0)
Immature Granulocytes: 0 %
Lymphocytes Relative: 25 %
Lymphs Abs: 1.2 10*3/uL (ref 0.7–4.0)
MCH: 29.3 pg (ref 26.0–34.0)
MCHC: 33 g/dL (ref 30.0–36.0)
MCV: 88.7 fL (ref 80.0–100.0)
Monocytes Absolute: 0.2 10*3/uL (ref 0.1–1.0)
Monocytes Relative: 4 %
Neutro Abs: 3.4 10*3/uL (ref 1.7–7.7)
Neutrophils Relative %: 70 %
Platelet Count: 107 10*3/uL — ABNORMAL LOW (ref 150–400)
RBC: 4.34 MIL/uL (ref 3.87–5.11)
RDW: 17.6 % — ABNORMAL HIGH (ref 11.5–15.5)
WBC Count: 4.9 10*3/uL (ref 4.0–10.5)
nRBC: 0 % (ref 0.0–0.2)

## 2017-12-17 MED ORDER — PROCHLORPERAZINE MALEATE 10 MG PO TABS: 10.0000 mg | ORAL_TABLET | Freq: Once | ORAL | Status: DC

## 2017-12-17 MED ORDER — BORTEZOMIB CHEMO SQ INJECTION 3.5 MG (2.5MG/ML)
1.3000 mg/m2 | Freq: Once | INTRAMUSCULAR | Status: AC
  Administered 2017-12-17: 2.75 mg via SUBCUTANEOUS
  Filled 2017-12-17: qty 1.1

## 2017-12-17 MED ORDER — OMEPRAZOLE 40 MG PO CPDR: 40.0000 mg | DELAYED_RELEASE_CAPSULE | Freq: Every day | ORAL | 0 refills | Status: DC

## 2017-12-17 MED ORDER — PROCHLORPERAZINE MALEATE 10 MG PO TABS: 10.0000 mg | ORAL_TABLET | Freq: Four times a day (QID) | ORAL | 0 refills | Status: DC | PRN

## 2017-12-17 MED FILL — PROCHLORPERAZINE 10 MG TAB: 10 | 7 days supply | Qty: 30 | Fill #0

## 2017-12-17 MED FILL — OMEPRAZOLE 40 MG CPDR: 40 | 30 days supply | Qty: 30 | Fill #1

## 2017-12-17 MED FILL — DEXAMETHASONE 4 MG TABLET: 4 | 28 days supply | Qty: 40 | Fill #2

## 2017-12-17 NOTE — Patient Instructions (Signed)

## 2017-12-17 NOTE — Progress Notes (Signed)
Pt received Xrays of bilat hips today.  Pt ambulated to and from radiology without issue, steady gait.  PA Lucianne Lei called and gave results of scan over the phone.  Pt also given paper copies of her omeprazole and compazine prescriptions from PA Lucianne Lei to take to [facility] outpatient pharmacy for filling.  Pt verbalized understanding to call if there are any questions or issues with the medications or her care.

## 2017-12-17 NOTE — Progress Notes (Signed)
These results were called to Dorathy Daft and were reviewed with her . Her were answered. She expressed understanding.

## 2017-12-17 NOTE — Telephone Encounter (Signed)
Pt sched per 10/24 sch message  °

## 2017-12-21 NOTE — Progress Notes (Signed)
I was asked to see the patient while she was receiving chemotherapy.  She reports continued bilateral hip pain which extends into mainly her left upper posterior thigh.  She is not taking any medication for this.  She was referred for a bilateral hip x-ray which returned without pathology.  Sandi Mealy, MHS, PA-C Physician Assistant

## 2017-12-24 ENCOUNTER — Ambulatory Visit: Payer: Self-pay

## 2017-12-24 ENCOUNTER — Other Ambulatory Visit: Payer: Self-pay

## 2017-12-24 ENCOUNTER — Inpatient Hospital Stay: Payer: Self-pay

## 2017-12-24 ENCOUNTER — Other Ambulatory Visit: Payer: Self-pay | Admitting: Oncology

## 2017-12-24 ENCOUNTER — Encounter: Payer: Self-pay | Admitting: Oncology

## 2017-12-24 ENCOUNTER — Inpatient Hospital Stay (HOSPITAL_BASED_OUTPATIENT_CLINIC_OR_DEPARTMENT_OTHER): Payer: Self-pay | Admitting: Oncology

## 2017-12-24 VITALS — BP 128/84 | HR 70 | Temp 98.2°F | Resp 18 | Ht 68.0 in | Wt 196.8 lb

## 2017-12-24 DIAGNOSIS — M25552 Pain in left hip: Secondary | ICD-10-CM

## 2017-12-24 DIAGNOSIS — C9000 Multiple myeloma not having achieved remission: Secondary | ICD-10-CM

## 2017-12-24 DIAGNOSIS — Z5111 Encounter for antineoplastic chemotherapy: Secondary | ICD-10-CM

## 2017-12-24 DIAGNOSIS — Z7952 Long term (current) use of systemic steroids: Secondary | ICD-10-CM

## 2017-12-24 DIAGNOSIS — R21 Rash and other nonspecific skin eruption: Secondary | ICD-10-CM

## 2017-12-24 DIAGNOSIS — M25551 Pain in right hip: Secondary | ICD-10-CM

## 2017-12-24 LAB — CBC WITH DIFFERENTIAL (CANCER CENTER ONLY)
Abs Immature Granulocytes: 0.01 10*3/uL (ref 0.00–0.07)
BASOS ABS: 0 10*3/uL (ref 0.0–0.1)
Basophils Relative: 1 %
EOS ABS: 0.2 10*3/uL (ref 0.0–0.5)
Eosinophils Relative: 6 %
HCT: 36.5 % (ref 36.0–46.0)
Hemoglobin: 12 g/dL (ref 12.0–15.0)
IMMATURE GRANULOCYTES: 0 %
LYMPHS ABS: 1.3 10*3/uL (ref 0.7–4.0)
Lymphocytes Relative: 35 %
MCH: 29.6 pg (ref 26.0–34.0)
MCHC: 32.9 g/dL (ref 30.0–36.0)
MCV: 89.9 fL (ref 80.0–100.0)
Monocytes Absolute: 0.3 10*3/uL (ref 0.1–1.0)
Monocytes Relative: 8 %
NRBC: 0 % (ref 0.0–0.2)
Neutro Abs: 1.8 10*3/uL (ref 1.7–7.7)
Neutrophils Relative %: 50 %
Platelet Count: 64 10*3/uL — ABNORMAL LOW (ref 150–400)
RBC: 4.06 MIL/uL (ref 3.87–5.11)
RDW: 17 % — AB (ref 11.5–15.5)
WBC: 3.6 10*3/uL — AB (ref 4.0–10.5)

## 2017-12-24 LAB — CMP (CANCER CENTER ONLY)
ALK PHOS: 94 U/L (ref 38–126)
ALT: 14 U/L (ref 0–44)
AST: 22 U/L (ref 15–41)
Albumin: 3.3 g/dL — ABNORMAL LOW (ref 3.5–5.0)
Anion gap: 9 (ref 5–15)
BUN: 15 mg/dL (ref 8–23)
CALCIUM: 8.8 mg/dL — AB (ref 8.9–10.3)
CO2: 24 mmol/L (ref 22–32)
Chloride: 107 mmol/L (ref 98–111)
Creatinine: 1.29 mg/dL — ABNORMAL HIGH (ref 0.44–1.00)
GFR, EST AFRICAN AMERICAN: 49 mL/min — AB (ref >60)
GFR, EST NON AFRICAN AMERICAN: 42 mL/min — AB (ref >60)
Glucose, Bld: 118 mg/dL — ABNORMAL HIGH (ref 70–99)
Potassium: 4.1 mmol/L (ref 3.5–5.1)
SODIUM: 140 mmol/L (ref 135–145)
Total Bilirubin: 0.6 mg/dL (ref 0.3–1.2)
Total Protein: 6.4 g/dL — ABNORMAL LOW (ref 6.5–8.1)

## 2017-12-24 MED ORDER — ACYCLOVIR 200 MG PO CAPS: 200.0000 mg | ORAL_CAPSULE | Freq: Two times a day (BID) | ORAL | 3 refills | Status: DC

## 2017-12-24 MED ORDER — BORTEZOMIB CHEMO SQ INJECTION 3.5 MG (2.5MG/ML)
1.3000 mg/m2 | Freq: Once | INTRAMUSCULAR | Status: AC
  Administered 2017-12-24: 2.75 mg via SUBCUTANEOUS
  Filled 2017-12-24: qty 1.1

## 2017-12-24 MED ORDER — PROCHLORPERAZINE MALEATE 10 MG PO TABS: 10.0000 mg | ORAL_TABLET | Freq: Once | ORAL | Status: DC

## 2017-12-24 MED FILL — ACYCLOVIR 200 MG CAP: 200 | 30 days supply | Qty: 60 | Fill #0

## 2017-12-24 NOTE — Patient Instructions (Signed)
[facility] Discharge Instructions for Patients Receiving Chemotherapy  Today you received the following chemotherapy agents bortezomib (Velcade)  To help prevent nausea and vomiting after your treatment, we encourage you to take your nausea medication as directed by your doctor.   If you develop nausea and vomiting that is not controlled by your nausea medication, call the clinic.   BELOW ARE SYMPTOMS THAT SHOULD BE REPORTED IMMEDIATELY:  *FEVER GREATER THAN 100.5 F  *CHILLS WITH OR WITHOUT FEVER  NAUSEA AND VOMITING THAT IS NOT CONTROLLED WITH YOUR NAUSEA MEDICATION  *UNUSUAL SHORTNESS OF BREATH  *UNUSUAL BRUISING OR BLEEDING  TENDERNESS IN MOUTH AND THROAT WITH OR WITHOUT PRESENCE OF ULCERS  *URINARY PROBLEMS  *BOWEL PROBLEMS  UNUSUAL RASH Items with * indicate a potential emergency and should be followed up as soon as possible.  Feel free to call the clinic should you have any questions or concerns. The clinic phone number is [phone].  Please show the CHEMO ALERT CARD at check-in to the Emergency Department and triage nurse.   

## 2017-12-24 NOTE — Progress Notes (Signed)
OK to treat with PLT count today per MD [facility].

## 2017-12-24 NOTE — Progress Notes (Signed)
Pt request for medication refills. Notified Dr. Julien Nordmann. Mikey Bussing, NP to send refills for pt.

## 2017-12-24 NOTE — Progress Notes (Signed)
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  PRIOR THERAPY:None  CURRENT THERAPY:Systemic therapy with Velcade 1.3 mg/M2 weekly, Revlimid 25 mg p.o. daily for 14 days every 3 weeks in addition to weekly Decadron 40 mg orally. First dose of treatment 10/08/2017.Treatment was placed on hold due to development of a significant rash.She resumed treatment with dexamethasone and Velcade on 11/19/2017. She will not resume treatment of Revlimid due to the rash.  Status post 2 months of treatment.  INTERVAL HISTORY: Debbie Chan 67 y.o. female returns for routine follow-up visit by herself.  The patient is feeling fine today and has no specific complaints except for back and hip pain.  She was seen in symptom management clinic recently and had an x-ray of her left hip which was negative.  She denies fevers and chills.  Denies chest pain, shortness of breath, cough, hemoptysis.  Denies nausea, vomiting, constipation, diarrhea.  Denies recurrent rashes.  The patient continues on treatment with Velcade and dexamethasone and is tolerating this fairly well.  She had a recent myeloma panel is here for discussion of her results.  MEDICAL HISTORY:History reviewed. No pertinent past medical history.  ALLERGIES:  has No Known Allergies.  MEDICATIONS:  Current Outpatient Medications  Medication Sig Dispense Refill  . acyclovir (ZOVIRAX) 200 MG capsule Take 1 capsule (200 mg total) by mouth 2 (two) times daily. 60 capsule 2  . dexamethasone (DECADRON) 4 MG tablet Take 10 tablets (40 mg total) by mouth every 7 (seven) days. 40 tablet 3  . omeprazole (PRILOSEC) 40 MG capsule Take 1 capsule (40 mg total) by mouth daily. 30 capsule 0  . blood glucose meter kit and supplies Dispense based on patient and insurance preference. Use up to four times daily as directed. (FOR ICD-10 E10.9,  E11.9). (Patient not taking: Reported on 11/26/2017) 1 each 0  . diphenhydrAMINE (BENADRYL ALLERGY) 25 MG tablet Take 1 tablet (25 mg total) by mouth every 6 (six) hours as needed for itching. (Patient not taking: Reported on 11/26/2017) 100 tablet 0  . furosemide (LASIX) 20 MG tablet Take once daily for 3 days for lower extremity edema, then stop. Repeat as needed for recurrent edema. (Patient not taking: Reported on 12/24/2017) 30 tablet 2  . metoprolol tartrate (LOPRESSOR) 25 MG tablet Take 0.5 tablets (12.5 mg total) by mouth 2 (two) times daily. 30 tablet 0  . polyethylene glycol (MIRALAX / GLYCOLAX) packet Take 17 g by mouth daily. (Patient not taking: Reported on 11/26/2017) 14 each 0  . predniSONE (DELTASONE) 20 MG tablet Take 1 1/2 tabs daily X 7 days, then decrease to 1/2 tab daily X 7 days, then stop (Patient not taking: Reported on 12/24/2017) 40 tablet 0  . prochlorperazine (COMPAZINE) 10 MG tablet Take 1 tablet (10 mg total) by mouth every 6 (six) hours as needed for nausea or vomiting. (Patient not taking: Reported on 12/24/2017) 30 tablet 0  . traZODone (DESYREL) 50 MG tablet Take 1 tablet (50 mg total) by mouth at bedtime. (Patient not taking: Reported on 12/24/2017) 30 tablet 0   No current facility-administered medications for this visit.     SURGICAL HISTORY: History reviewed. No pertinent surgical history.  REVIEW OF SYSTEMS:   Review of Systems  Constitutional: Negative for appetite change, chills, fatigue, fever and unexpected weight change.  HENT:   Negative for mouth sores, nosebleeds, sore throat and trouble swallowing.  Eyes: Negative for eye problems and icterus.  Respiratory: Negative for cough, hemoptysis, shortness of breath and wheezing.   Cardiovascular: Negative for chest pain and leg swelling.  Gastrointestinal: Negative for abdominal pain, constipation, diarrhea, nausea and vomiting.  Genitourinary: Negative for bladder incontinence, difficulty urinating,  dysuria, frequency and hematuria.   Musculoskeletal: Positive for low back pain and hip pain. Skin: Negative for itching and rash.  Neurological: Negative for dizziness, extremity weakness, gait problem, headaches, light-headedness and seizures.  Hematological: Negative for adenopathy. Does not bruise/bleed easily.  Psychiatric/Behavioral: Negative for confusion, depression and sleep disturbance. The patient is not nervous/anxious.     PHYSICAL EXAMINATION:  Blood pressure 128/84, pulse 70, temperature 98.2 F (36.8 C), temperature source Oral, resp. rate 18, height _0  (1.727 m), weight 196 lb 12.8 oz (89.3 kg), SpO2 97 %.  ECOG PERFORMANCE STATUS: 1 - Symptomatic but completely ambulatory  Physical Exam  Constitutional: Oriented to person, place, and time and well-developed, well-nourished, and in no distress. No distress.  HENT:  Head: Normocephalic and atraumatic.  Mouth/Throat: Oropharynx is clear and moist. No oropharyngeal exudate.  Eyes: Conjunctivae are normal. Right eye exhibits no discharge. Left eye exhibits no discharge. No scleral icterus.  Neck: Normal range of motion. Neck supple.  Cardiovascular: Normal rate, regular rhythm, normal heart sounds and intact distal pulses.   Pulmonary/Chest: Effort normal and breath sounds normal. No respiratory distress. No wheezes. No rales.  Abdominal: Soft. Bowel sounds are normal. Exhibits no distension and no mass. There is no tenderness.  Musculoskeletal: Normal range of motion. Exhibits no edema.  Lymphadenopathy:    No cervical adenopathy.  Neurological: Alert and oriented to person, place, and time. Exhibits normal muscle tone. Gait normal. Coordination normal.  Skin: Skin is warm and dry. No rash noted. Not diaphoretic. No erythema. No pallor.  Psychiatric: Mood, memory and judgment normal.  Vitals reviewed.  LABORATORY DATA: Lab Results  Component Value Date   WBC 3.6 (L) 12/24/2017   HGB 12.0 12/24/2017   HCT 36.5  12/24/2017   MCV 89.9 12/24/2017   PLT 64 (L) 12/24/2017      Chemistry      Component Value Date/Time   NA 140 12/24/2017 0822   K 4.1 12/24/2017 0822   CL 107 12/24/2017 0822   CO2 24 12/24/2017 0822   BUN 15 12/24/2017 0822   CREATININE 1.29 (H) 12/24/2017 0822      Component Value Date/Time   CALCIUM 8.8 (L) 12/24/2017 0822   ALKPHOS 94 12/24/2017 0822   AST 22 12/24/2017 0822   ALT 14 12/24/2017 0822   BILITOT 0.6 12/24/2017 0822       RADIOGRAPHIC STUDIES:  Dg Hip Unilat W Or W/o Pelvis 1 View Left  Result Date: 12/17/2017 CLINICAL DATA:  Bilateral hip pain without known injury. EXAM: DG HIP (WITH OR WITHOUT PELVIS) 1V*L* COMPARISON:  None. FINDINGS: There is no evidence of hip fracture or dislocation. There is no evidence of arthropathy or other focal bone abnormality. IMPRESSION: Negative. Electronically Signed   By: Marijo Conception, M.D.   On: 12/17/2017 12:19   Dg Hip Unilat W Or W/o Pelvis 1 View Right  Result Date: 12/17/2017 CLINICAL DATA:  Bilateral hip pain without known injury. EXAM: DG HIP (WITH OR WITHOUT PELVIS) 1V RIGHT COMPARISON:  None. FINDINGS: There is no evidence of hip fracture or dislocation. There is no evidence of arthropathy or other focal bone abnormality. IMPRESSION: Negative. Electronically Signed   By: Marijo Conception,  M.D.   On: 12/17/2017 12:20     ASSESSMENT/PLAN:  Multiple myeloma without remission [facility]) This is a very pleasant 67 year old African female originally from [place] who is visiting her son in [place] and was recently diagnosed with IgA multiple myeloma. The patient was started onsystemic therapy with Velcade 1.3 mg/M2 weekly in addition to Revlimid 25 mg p.o. daily for 14 days every 3 weeks as well as weekly Decadron 40 mg orally.She developed significant rash and her treatment was placed on hold. Rash is likely due to Revlimid.  She was treated with a tapering dose of prednisone with resolution of her  symptoms.  She resumed treatment with Velcade and dexamethasone and has been tolerating this well overall with no concerning complaints.  She had a recent myeloma panel and is here to discuss the results.  The patient was seen with Dr. Julien Nordmann.  Myeloma panel results were discussed with the patient which showed a significant drop in her IgA level.  Recommend that she continue on Velcade and dexamethasone.  Labs from today have been reviewed and her platelet count 64,000.  Okay to proceed with treatment today as scheduled.  The patient will return weekly for labs and chemotherapy.  She will have a follow-up visit in 4 weeks for evaluation repeat lab work.  For her back and hip pain, she was encouraged to use acetaminophen on as-needed basis.  The patient was advised to call immediately if she has any concerning symptoms in the interval. The patient voices understanding of current disease status and treatment options and is in agreement with the current care plan.  All questions were answered. The patient knows to call the clinic with any problems, questions or concerns. We can certainly see the patient much sooner if necessary.   No orders of the defined types were placed in this encounter.    Mikey Bussing, DNP, AGPCNP-BC, AOCNP 12/24/17   ADDENDUM: Hematology/Oncology Attending: I had a face-to-face encounter with the patient.  I recommended her care plan.  This is a very pleasant 66 years old African female recently diagnosed with multiple myeloma.  The patient is currently on treatment with subcutaneous Velcade and Decadron.  Her previous treatment with Revlimid was discontinued secondary to significant skin rash.  She continues to tolerate her current treatment with Velcade and Decadron fairly well.  The patient had repeat myeloma panel performed recently.  I discussed the results with the patient today.  Her labs showed significant improvement of her disease with decrease in the free  lambda light chain as well as the IgA level. I recommended for the patient to continue her current treatment with Velcade and Decadron for now. We will see her back for follow-up visit in 4 weeks for evaluation and management of any adverse effect of her treatment. The patient was advised to call immediately if she has any concerning symptoms in the interval.  Disclaimer: This note was dictated with voice recognition software. Similar sounding words can inadvertently be transcribed and may be missed upon review. Eilleen Kempf, MD 12/25/17

## 2017-12-24 NOTE — Assessment & Plan Note (Addendum)
This is a very pleasant 67 year old African female originally from [place] who is visiting her son in [place] and was recently diagnosed with IgA multiple myeloma. The patient was started onsystemic therapy with Velcade 1.3 mg/M2 weekly in addition to Revlimid 25 mg p.o. daily for 14 days every 3 weeks as well as weekly Decadron 40 mg orally.She developed significant rash and her treatment was placed on hold. Rash is likely due to Revlimid.  She was treated with a tapering dose of prednisone with resolution of her symptoms.  She resumed treatment with Velcade and dexamethasone and has been tolerating this well overall with no concerning complaints.  She had a recent myeloma panel and is here to discuss the results.  The patient was seen with Dr. Julien Nordmann.  Myeloma panel results were discussed with the patient which showed a significant drop in her IgA level.  Recommend that she continue on Velcade and dexamethasone.  Labs from today have been reviewed and her platelet count 64,000.  Okay to proceed with treatment today as scheduled.  The patient will return weekly for labs and chemotherapy.  She will have a follow-up visit in 4 weeks for evaluation repeat lab work.  For her back and hip pain, she was encouraged to use acetaminophen on as-needed basis.  The patient was advised to call immediately if she has any concerning symptoms in the interval. The patient voices understanding of current disease status and treatment options and is in agreement with the current care plan.  All questions were answered. The patient knows to call the clinic with any problems, questions or concerns. We can certainly see the patient much sooner if necessary.

## 2017-12-25 ENCOUNTER — Telehealth: Payer: Self-pay | Admitting: Oncology

## 2017-12-25 NOTE — Telephone Encounter (Signed)
Appts already scheduled per 10/31 los - no additional appts added at the moment.

## 2017-12-31 ENCOUNTER — Inpatient Hospital Stay: Payer: Self-pay

## 2017-12-31 ENCOUNTER — Inpatient Hospital Stay: Payer: Self-pay | Attending: Internal Medicine

## 2017-12-31 VITALS — BP 158/85 | HR 70 | Temp 97.6°F | Resp 18 | Ht 68.0 in | Wt 195.5 lb

## 2017-12-31 DIAGNOSIS — Z79899 Other long term (current) drug therapy: Secondary | ICD-10-CM | POA: Insufficient documentation

## 2017-12-31 DIAGNOSIS — R51 Headache: Secondary | ICD-10-CM | POA: Insufficient documentation

## 2017-12-31 DIAGNOSIS — C9000 Multiple myeloma not having achieved remission: Secondary | ICD-10-CM

## 2017-12-31 DIAGNOSIS — Z5111 Encounter for antineoplastic chemotherapy: Secondary | ICD-10-CM | POA: Insufficient documentation

## 2017-12-31 DIAGNOSIS — M549 Dorsalgia, unspecified: Secondary | ICD-10-CM | POA: Insufficient documentation

## 2017-12-31 DIAGNOSIS — Z23 Encounter for immunization: Secondary | ICD-10-CM | POA: Insufficient documentation

## 2017-12-31 DIAGNOSIS — R5383 Other fatigue: Secondary | ICD-10-CM | POA: Insufficient documentation

## 2017-12-31 DIAGNOSIS — R21 Rash and other nonspecific skin eruption: Secondary | ICD-10-CM | POA: Insufficient documentation

## 2017-12-31 LAB — CMP (CANCER CENTER ONLY)
ALBUMIN: 3.6 g/dL (ref 3.5–5.0)
ALK PHOS: 121 U/L (ref 38–126)
ALT: 13 U/L (ref 0–44)
ANION GAP: 8 (ref 5–15)
AST: 20 U/L (ref 15–41)
BILIRUBIN TOTAL: 0.5 mg/dL (ref 0.3–1.2)
BUN: 14 mg/dL (ref 8–23)
CALCIUM: 9.1 mg/dL (ref 8.9–10.3)
CO2: 22 mmol/L (ref 22–32)
Chloride: 106 mmol/L (ref 98–111)
Creatinine: 1.19 mg/dL — ABNORMAL HIGH (ref 0.44–1.00)
GFR, EST AFRICAN AMERICAN: 54 mL/min — AB (ref >60)
GFR, EST NON AFRICAN AMERICAN: 46 mL/min — AB (ref >60)
GLUCOSE: 210 mg/dL — AB (ref 70–99)
POTASSIUM: 4.5 mmol/L (ref 3.5–5.1)
Sodium: 136 mmol/L (ref 135–145)
TOTAL PROTEIN: 7.1 g/dL (ref 6.5–8.1)

## 2017-12-31 LAB — CBC WITH DIFFERENTIAL (CANCER CENTER ONLY)
Abs Immature Granulocytes: 0.03 10*3/uL (ref 0.00–0.07)
BASOS ABS: 0 10*3/uL (ref 0.0–0.1)
Basophils Relative: 0 %
EOS PCT: 0 %
Eosinophils Absolute: 0 10*3/uL (ref 0.0–0.5)
HEMATOCRIT: 38.4 % (ref 36.0–46.0)
Hemoglobin: 12.5 g/dL (ref 12.0–15.0)
Immature Granulocytes: 1 %
LYMPHS ABS: 0.9 10*3/uL (ref 0.7–4.0)
Lymphocytes Relative: 21 %
MCH: 29.3 pg (ref 26.0–34.0)
MCHC: 32.6 g/dL (ref 30.0–36.0)
MCV: 90.1 fL (ref 80.0–100.0)
Monocytes Absolute: 0.1 10*3/uL (ref 0.1–1.0)
Monocytes Relative: 2 %
NRBC: 0 % (ref 0.0–0.2)
Neutro Abs: 3.4 10*3/uL (ref 1.7–7.7)
Neutrophils Relative %: 76 %
Platelet Count: 69 10*3/uL — ABNORMAL LOW (ref 150–400)
RBC: 4.26 MIL/uL (ref 3.87–5.11)
RDW: 16.7 % — ABNORMAL HIGH (ref 11.5–15.5)
WBC: 4.5 10*3/uL (ref 4.0–10.5)

## 2017-12-31 MED ORDER — PROCHLORPERAZINE MALEATE 10 MG PO TABS
ORAL_TABLET | ORAL | Status: AC
  Filled 2017-12-31: qty 1

## 2017-12-31 MED ORDER — PROCHLORPERAZINE MALEATE 10 MG PO TABS: 10.0000 mg | ORAL_TABLET | Freq: Once | ORAL | Status: DC

## 2017-12-31 MED ORDER — BORTEZOMIB CHEMO SQ INJECTION 3.5 MG (2.5MG/ML)
1.3000 mg/m2 | Freq: Once | INTRAMUSCULAR | Status: AC
  Administered 2017-12-31: 2.75 mg via SUBCUTANEOUS
  Filled 2017-12-31: qty 1.1

## 2017-12-31 MED FILL — DEXAMETHASONE 4 MG TABLET: 4 | 28 days supply | Qty: 40 | Fill #3

## 2017-12-31 NOTE — Progress Notes (Signed)
Per Dr. Julien Nordmann, okay to continue patient's treatment with Plt of 69.

## 2017-12-31 NOTE — Patient Instructions (Signed)
[facility] Discharge Instructions for Patients Receiving Chemotherapy  Today you received the following chemotherapy agents: Bortezomib (Velcade)  To help prevent nausea and vomiting after your treatment, we encourage you to take your nausea medication  as prescribed.    If you develop nausea and vomiting that is not controlled by your nausea medication, call the clinic.   BELOW ARE SYMPTOMS THAT SHOULD BE REPORTED IMMEDIATELY:  *FEVER GREATER THAN 100.5 F  *CHILLS WITH OR WITHOUT FEVER  NAUSEA AND VOMITING THAT IS NOT CONTROLLED WITH YOUR NAUSEA MEDICATION  *UNUSUAL SHORTNESS OF BREATH  *UNUSUAL BRUISING OR BLEEDING  TENDERNESS IN MOUTH AND THROAT WITH OR WITHOUT PRESENCE OF ULCERS  *URINARY PROBLEMS  *BOWEL PROBLEMS  UNUSUAL RASH Items with * indicate a potential emergency and should be followed up as soon as possible.  Feel free to call the clinic should you have any questions or concerns. The clinic phone number is [phone].  Please show the CHEMO ALERT CARD at check-in to the Emergency Department and triage nurse.   

## 2018-01-07 ENCOUNTER — Inpatient Hospital Stay: Payer: Self-pay

## 2018-01-07 VITALS — BP 135/81 | HR 68 | Temp 98.9°F | Resp 18 | Ht 68.0 in | Wt 195.0 lb

## 2018-01-07 DIAGNOSIS — C9000 Multiple myeloma not having achieved remission: Secondary | ICD-10-CM

## 2018-01-07 LAB — CMP (CANCER CENTER ONLY)
ALT: 13 U/L (ref 0–44)
AST: 19 U/L (ref 15–41)
Albumin: 3.6 g/dL (ref 3.5–5.0)
Alkaline Phosphatase: 114 U/L (ref 38–126)
Anion gap: 12 (ref 5–15)
BUN: 14 mg/dL (ref 8–23)
CO2: 21 mmol/L — ABNORMAL LOW (ref 22–32)
CREATININE: 1.37 mg/dL — AB (ref 0.44–1.00)
Calcium: 9.1 mg/dL (ref 8.9–10.3)
Chloride: 103 mmol/L (ref 98–111)
GFR, Est AFR Am: 45 mL/min — ABNORMAL LOW (ref >60)
GFR, Estimated: 39 mL/min — ABNORMAL LOW (ref >60)
Glucose, Bld: 266 mg/dL — ABNORMAL HIGH (ref 70–99)
POTASSIUM: 4.8 mmol/L (ref 3.5–5.1)
Sodium: 136 mmol/L (ref 135–145)
TOTAL PROTEIN: 6.9 g/dL (ref 6.5–8.1)
Total Bilirubin: 0.5 mg/dL (ref 0.3–1.2)

## 2018-01-07 LAB — CBC WITH DIFFERENTIAL (CANCER CENTER ONLY)
ABS IMMATURE GRANULOCYTES: 0.03 10*3/uL (ref 0.00–0.07)
BASOS ABS: 0 10*3/uL (ref 0.0–0.1)
Basophils Relative: 0 %
EOS PCT: 0 %
Eosinophils Absolute: 0 10*3/uL (ref 0.0–0.5)
HCT: 39.3 % (ref 36.0–46.0)
Hemoglobin: 12.6 g/dL (ref 12.0–15.0)
Immature Granulocytes: 1 %
LYMPHS PCT: 16 %
Lymphs Abs: 0.9 10*3/uL (ref 0.7–4.0)
MCH: 29.2 pg (ref 26.0–34.0)
MCHC: 32.1 g/dL (ref 30.0–36.0)
MCV: 91.2 fL (ref 80.0–100.0)
Monocytes Absolute: 0.1 10*3/uL (ref 0.1–1.0)
Monocytes Relative: 1 %
NEUTROS ABS: 4.8 10*3/uL (ref 1.7–7.7)
NEUTROS PCT: 82 %
NRBC: 0 % (ref 0.0–0.2)
PLATELETS: 62 10*3/uL — AB (ref 150–400)
RBC: 4.31 MIL/uL (ref 3.87–5.11)
RDW: 16.3 % — AB (ref 11.5–15.5)
WBC: 5.8 10*3/uL (ref 4.0–10.5)

## 2018-01-07 MED ORDER — PROCHLORPERAZINE MALEATE 10 MG PO TABS: 10.0000 mg | ORAL_TABLET | Freq: Once | ORAL | Status: DC

## 2018-01-07 MED ORDER — BORTEZOMIB CHEMO SQ INJECTION 3.5 MG (2.5MG/ML)
1.3000 mg/m2 | Freq: Once | INTRAMUSCULAR | Status: AC
  Administered 2018-01-07: 2.75 mg via SUBCUTANEOUS
  Filled 2018-01-07: qty 1.1

## 2018-01-14 ENCOUNTER — Ambulatory Visit: Payer: Self-pay | Admitting: Oncology

## 2018-01-14 ENCOUNTER — Inpatient Hospital Stay: Payer: Self-pay

## 2018-01-14 ENCOUNTER — Other Ambulatory Visit: Payer: Self-pay

## 2018-01-14 VITALS — BP 150/84 | HR 72 | Temp 98.0°F | Resp 16 | Wt 194.2 lb

## 2018-01-14 DIAGNOSIS — Z23 Encounter for immunization: Secondary | ICD-10-CM

## 2018-01-14 DIAGNOSIS — C9000 Multiple myeloma not having achieved remission: Secondary | ICD-10-CM

## 2018-01-14 LAB — CMP (CANCER CENTER ONLY)
ALBUMIN: 3.8 g/dL (ref 3.5–5.0)
ALK PHOS: 124 U/L (ref 38–126)
ALT: 14 U/L (ref 0–44)
ANION GAP: 10 (ref 5–15)
AST: 22 U/L (ref 15–41)
BILIRUBIN TOTAL: 0.6 mg/dL (ref 0.3–1.2)
BUN: 13 mg/dL (ref 8–23)
CALCIUM: 9.3 mg/dL (ref 8.9–10.3)
CO2: 22 mmol/L (ref 22–32)
Chloride: 102 mmol/L (ref 98–111)
Creatinine: 1.23 mg/dL — ABNORMAL HIGH (ref 0.44–1.00)
GFR, EST AFRICAN AMERICAN: 51 mL/min — AB (ref >60)
GFR, Estimated: 44 mL/min — ABNORMAL LOW (ref >60)
GLUCOSE: 191 mg/dL — AB (ref 70–99)
Potassium: 4.8 mmol/L (ref 3.5–5.1)
Sodium: 134 mmol/L — ABNORMAL LOW (ref 135–145)
TOTAL PROTEIN: 7.4 g/dL (ref 6.5–8.1)

## 2018-01-14 LAB — CBC WITH DIFFERENTIAL (CANCER CENTER ONLY)
Abs Immature Granulocytes: 0.03 10*3/uL (ref 0.00–0.07)
BASOS PCT: 0 %
Basophils Absolute: 0 10*3/uL (ref 0.0–0.1)
EOS PCT: 0 %
Eosinophils Absolute: 0 10*3/uL (ref 0.0–0.5)
HEMATOCRIT: 40.1 % (ref 36.0–46.0)
HEMOGLOBIN: 13.5 g/dL (ref 12.0–15.0)
Immature Granulocytes: 1 %
LYMPHS ABS: 0.8 10*3/uL (ref 0.7–4.0)
Lymphocytes Relative: 15 %
MCH: 29.7 pg (ref 26.0–34.0)
MCHC: 33.7 g/dL (ref 30.0–36.0)
MCV: 88.3 fL (ref 80.0–100.0)
MONO ABS: 0.1 10*3/uL (ref 0.1–1.0)
MONOS PCT: 1 %
Neutro Abs: 4.8 10*3/uL (ref 1.7–7.7)
Neutrophils Relative %: 83 %
Platelet Count: 101 10*3/uL — ABNORMAL LOW (ref 150–400)
RBC: 4.54 MIL/uL (ref 3.87–5.11)
RDW: 15.2 % (ref 11.5–15.5)
WBC Count: 5.7 10*3/uL (ref 4.0–10.5)
nRBC: 0 % (ref 0.0–0.2)

## 2018-01-14 MED ORDER — INFLUENZA VAC SPLIT QUAD 0.5 ML IM SUSY
0.5000 mL | PREFILLED_SYRINGE | Freq: Once | INTRAMUSCULAR | Status: AC
  Administered 2018-01-14: 0.5 mL via INTRAMUSCULAR

## 2018-01-14 MED ORDER — INFLUENZA VAC SPLIT QUAD 0.5 ML IM SUSY
PREFILLED_SYRINGE | INTRAMUSCULAR | Status: AC
  Filled 2018-01-14: qty 0.5

## 2018-01-14 MED ORDER — PROCHLORPERAZINE MALEATE 10 MG PO TABS
10.0000 mg | ORAL_TABLET | Freq: Once | ORAL | Status: AC
  Administered 2018-01-14: 10 mg via ORAL

## 2018-01-14 MED ORDER — BORTEZOMIB CHEMO SQ INJECTION 3.5 MG (2.5MG/ML)
1.3000 mg/m2 | Freq: Once | INTRAMUSCULAR | Status: AC
  Administered 2018-01-14: 2.75 mg via SUBCUTANEOUS
  Filled 2018-01-14: qty 1.1

## 2018-01-14 MED ORDER — PROCHLORPERAZINE MALEATE 10 MG PO TABS
ORAL_TABLET | ORAL | Status: AC
  Filled 2018-01-14: qty 1

## 2018-01-14 MED FILL — OMEPRAZOLE 40 MG CPDR: 40 | 30 days supply | Qty: 30 | Fill #2

## 2018-01-14 NOTE — Patient Instructions (Signed)
[facility] Discharge Instructions for Patients Receiving Chemotherapy  Today you received the following chemotherapy agents:  Velcade  To help prevent nausea and vomiting after your treatment, we encourage you to take your nausea medication as prescribed.   If you develop nausea and vomiting that is not controlled by your nausea medication, call the clinic.   BELOW ARE SYMPTOMS THAT SHOULD BE REPORTED IMMEDIATELY:  *FEVER GREATER THAN 100.5 F  *CHILLS WITH OR WITHOUT FEVER  NAUSEA AND VOMITING THAT IS NOT CONTROLLED WITH YOUR NAUSEA MEDICATION  *UNUSUAL SHORTNESS OF BREATH  *UNUSUAL BRUISING OR BLEEDING  TENDERNESS IN MOUTH AND THROAT WITH OR WITHOUT PRESENCE OF ULCERS  *URINARY PROBLEMS  *BOWEL PROBLEMS  UNUSUAL RASH Items with * indicate a potential emergency and should be followed up as soon as possible.  Feel free to call the clinic should you have any questions or concerns. The clinic phone number is [phone].  Please show the [facility] at check-in to the Emergency Department and triage nurse.  Influenza Virus Vaccine (Flucelvax) What is this medicine? INFLUENZA VIRUS VACCINE (in floo EN zuh VAHY ruhs vak SEEN) helps to reduce the risk of getting influenza also known as the flu. The vaccine only helps protect you against some strains of the flu. This medicine may be used for other purposes; ask your health care provider or pharmacist if you have questions. COMMON BRAND NAME(S): FLUCELVAX What should I tell my health care provider before I take this medicine? They need to know if you have any of these conditions: -bleeding disorder like hemophilia -fever or infection -Guillain-Barre syndrome or other neurological problems -immune system problems -infection with the human immunodeficiency virus (HIV) or AIDS -low blood platelet counts -multiple sclerosis -an unusual or allergic reaction to influenza virus vaccine, other medicines, foods,  dyes or preservatives -pregnant or trying to get pregnant -breast-feeding How should I use this medicine? This vaccine is for injection into a muscle. It is given by a health care professional. A copy of Vaccine Information Statements will be given before each vaccination. Read this sheet carefully each time. The sheet may change frequently. Talk to your pediatrician regarding the use of this medicine in children. Special care may be needed. Overdosage: If you think you've taken too much of this medicine contact a poison control center or emergency room at once. Overdosage: If you think you have taken too much of this medicine contact a poison control center or emergency room at once. NOTE: This medicine is only for you. Do not share this medicine with others. What if I miss a dose? This does not apply. What may interact with this medicine? -chemotherapy or radiation therapy -medicines that lower your immune system like etanercept, anakinra, infliximab, and adalimumab -medicines that treat or prevent blood clots like warfarin -phenytoin -steroid medicines like prednisone or cortisone -theophylline -vaccines This list may not describe all possible interactions. Give your health care provider a list of all the medicines, herbs, non-prescription drugs, or dietary supplements you use. Also tell them if you smoke, drink alcohol, or use illegal drugs. Some items may interact with your medicine. What should I watch for while using this medicine? Report any side effects that do not go away within 3 days to your doctor or health care professional. Call your health care provider if any unusual symptoms occur within 6 weeks of receiving this vaccine. You may still catch the flu, but the illness is not usually as bad. You cannot get  the flu from the vaccine. The vaccine will not protect against colds or other illnesses that may cause fever. The vaccine is needed every year. What side effects may I notice  from receiving this medicine? Side effects that you should report to your doctor or health care professional as soon as possible: -allergic reactions like skin rash, itching or hives, swelling of the face, lips, or tongue Side effects that usually do not require medical attention (Report these to your doctor or health care professional if they continue or are bothersome.): -fever -headache -muscle aches and pains -pain, tenderness, redness, or swelling at the injection site -tiredness This list may not describe all possible side effects. Call your doctor for medical advice about side effects. You may report side effects to FDA at 1-800-FDA-1088. Where should I keep my medicine? The vaccine will be given by a health care professional in a clinic, pharmacy, doctor's office, or other health care setting. You will not be given vaccine doses to store at home. NOTE: This sheet is a summary. It may not cover all possible information. If you have questions about this medicine, talk to your doctor, pharmacist, or health care provider.  2018 Elsevier/Gold Standard (2011-01-22 14:06:47)

## 2018-01-20 ENCOUNTER — Inpatient Hospital Stay: Payer: Self-pay

## 2018-01-20 ENCOUNTER — Inpatient Hospital Stay (HOSPITAL_BASED_OUTPATIENT_CLINIC_OR_DEPARTMENT_OTHER): Payer: Self-pay | Admitting: Internal Medicine

## 2018-01-20 ENCOUNTER — Encounter: Payer: Self-pay | Admitting: Internal Medicine

## 2018-01-20 ENCOUNTER — Telehealth: Payer: Self-pay | Admitting: Internal Medicine

## 2018-01-20 VITALS — BP 129/78 | HR 72 | Temp 97.0°F | Resp 18 | Ht 68.0 in | Wt 194.3 lb

## 2018-01-20 DIAGNOSIS — R51 Headache: Secondary | ICD-10-CM

## 2018-01-20 DIAGNOSIS — M899 Disorder of bone, unspecified: Secondary | ICD-10-CM

## 2018-01-20 DIAGNOSIS — C9000 Multiple myeloma not having achieved remission: Secondary | ICD-10-CM

## 2018-01-20 DIAGNOSIS — Z79899 Other long term (current) drug therapy: Secondary | ICD-10-CM

## 2018-01-20 DIAGNOSIS — R21 Rash and other nonspecific skin eruption: Secondary | ICD-10-CM

## 2018-01-20 DIAGNOSIS — R5383 Other fatigue: Secondary | ICD-10-CM

## 2018-01-20 DIAGNOSIS — Z5111 Encounter for antineoplastic chemotherapy: Secondary | ICD-10-CM

## 2018-01-20 DIAGNOSIS — M549 Dorsalgia, unspecified: Secondary | ICD-10-CM

## 2018-01-20 LAB — CBC WITH DIFFERENTIAL (CANCER CENTER ONLY)
Abs Immature Granulocytes: 0.07 10*3/uL (ref 0.00–0.07)
Basophils Absolute: 0 10*3/uL (ref 0.0–0.1)
Basophils Relative: 0 %
EOS ABS: 0 10*3/uL (ref 0.0–0.5)
EOS PCT: 0 %
HCT: 40.3 % (ref 36.0–46.0)
Hemoglobin: 13.3 g/dL (ref 12.0–15.0)
IMMATURE GRANULOCYTES: 1 %
LYMPHS ABS: 1.1 10*3/uL (ref 0.7–4.0)
LYMPHS PCT: 20 %
MCH: 29.4 pg (ref 26.0–34.0)
MCHC: 33 g/dL (ref 30.0–36.0)
MCV: 89.2 fL (ref 80.0–100.0)
MONOS PCT: 2 %
Monocytes Absolute: 0.1 10*3/uL (ref 0.1–1.0)
NEUTROS PCT: 77 %
Neutro Abs: 3.9 10*3/uL (ref 1.7–7.7)
PLATELETS: 103 10*3/uL — AB (ref 150–400)
RBC: 4.52 MIL/uL (ref 3.87–5.11)
RDW: 15.3 % (ref 11.5–15.5)
WBC Count: 5.2 10*3/uL (ref 4.0–10.5)
nRBC: 0 % (ref 0.0–0.2)

## 2018-01-20 LAB — CMP (CANCER CENTER ONLY)
ALK PHOS: 115 U/L (ref 38–126)
ALT: 14 U/L (ref 0–44)
AST: 18 U/L (ref 15–41)
Albumin: 3.7 g/dL (ref 3.5–5.0)
Anion gap: 10 (ref 5–15)
BUN: 18 mg/dL (ref 8–23)
CALCIUM: 9.4 mg/dL (ref 8.9–10.3)
CO2: 24 mmol/L (ref 22–32)
CREATININE: 1.45 mg/dL — AB (ref 0.44–1.00)
Chloride: 105 mmol/L (ref 98–111)
GFR, EST AFRICAN AMERICAN: 43 mL/min — AB (ref >60)
GFR, EST NON AFRICAN AMERICAN: 37 mL/min — AB (ref >60)
GLUCOSE: 136 mg/dL — AB (ref 70–99)
Potassium: 4.3 mmol/L (ref 3.5–5.1)
Sodium: 139 mmol/L (ref 135–145)
Total Bilirubin: 0.5 mg/dL (ref 0.3–1.2)
Total Protein: 7.2 g/dL (ref 6.5–8.1)

## 2018-01-20 MED ORDER — BORTEZOMIB CHEMO SQ INJECTION 3.5 MG (2.5MG/ML)
1.3000 mg/m2 | Freq: Once | INTRAMUSCULAR | Status: AC
  Administered 2018-01-20: 2.75 mg via SUBCUTANEOUS
  Filled 2018-01-20: qty 1.1

## 2018-01-20 MED ORDER — PROCHLORPERAZINE MALEATE 10 MG PO TABS: 10.0000 mg | ORAL_TABLET | Freq: Once | ORAL | Status: DC

## 2018-01-20 NOTE — Progress Notes (Signed)
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  PRIOR THERAPY: None  CURRENT THERAPY: Systemic therapy with Velcade 1.3 mg/M2 weekly, Revlimid 25 mg p.o. daily for 14 days every 3 weeks in addition to weekly Decadron 40 mg orally. First dose of treatment 10/08/2017.Treatment was placed on hold due to development of a significant rash.Sheresumedtreatment with dexamethasone and Velcade on 11/19/2017. She will not resume treatment of Revlimid due to the rash.  Status post 3 months of treatment.  INTERVAL HISTORY: Debbie Chan 67 y.o. female returns to the clinic today for follow-up visit and accompanied by her daughter-in-law.  The patient is feeling fine today with no concerning complaints except for the persistent back pain and intermittent headache.  She denied having any current nausea, vomiting, diarrhea or constipation.  She denied having any fever or chills.  She has no chest pain, shortness of breath, cough or hemoptysis.  She continues to tolerate her treatment with Velcade and Decadron fairly well.  She is here for evaluation and repeat blood work before starting the first cycle of this treatment.  MEDICAL HISTORY:No past medical history on file.  ALLERGIES:  has No Known Allergies.  MEDICATIONS:  Current Outpatient Medications  Medication Sig Dispense Refill  . acyclovir (ZOVIRAX) 200 MG capsule Take 1 capsule (200 mg total) by mouth 2 (two) times daily. 60 capsule 3  . blood glucose meter kit and supplies Dispense based on patient and insurance preference. Use up to four times daily as directed. (FOR ICD-10 E10.9, E11.9). (Patient not taking: Reported on 11/26/2017) 1 each 0  . dexamethasone (DECADRON) 4 MG tablet Take 10 tablets (40 mg total) by mouth every 7 (seven) days. 40 tablet 3  .  diphenhydrAMINE (BENADRYL ALLERGY) 25 MG tablet Take 1 tablet (25 mg total) by mouth every 6 (six) hours as needed for itching. (Patient not taking: Reported on 11/26/2017) 100 tablet 0  . furosemide (LASIX) 20 MG tablet Take once daily for 3 days for lower extremity edema, then stop. Repeat as needed for recurrent edema. (Patient not taking: Reported on 12/24/2017) 30 tablet 2  . metoprolol tartrate (LOPRESSOR) 25 MG tablet Take 0.5 tablets (12.5 mg total) by mouth 2 (two) times daily. 30 tablet 0  . omeprazole (PRILOSEC) 40 MG capsule Take 1 capsule (40 mg total) by mouth daily. 30 capsule 0  . polyethylene glycol (MIRALAX / GLYCOLAX) packet Take 17 g by mouth daily. (Patient not taking: Reported on 11/26/2017) 14 each 0  . predniSONE (DELTASONE) 20 MG tablet Take 1 1/2 tabs daily X 7 days, then decrease to 1/2 tab daily X 7 days, then stop (Patient not taking: Reported on 12/24/2017) 40 tablet 0  . prochlorperazine (COMPAZINE) 10 MG tablet Take 1 tablet (10 mg total) by mouth every 6 (six) hours as needed for nausea or vomiting. (Patient not taking: Reported on 12/24/2017) 30 tablet 0  . traZODone (DESYREL) 50 MG tablet Take 1 tablet (50 mg total) by mouth at bedtime. (Patient not taking: Reported on 12/24/2017) 30 tablet 0   No current facility-administered medications for this visit.     SURGICAL HISTORY: No past surgical history on file.  REVIEW OF SYSTEMS:  A comprehensive review of systems was negative except for: Constitutional: positive for fatigue Musculoskeletal: positive for back pain Neurological: positive for headaches   PHYSICAL EXAMINATION:  General appearance: alert, cooperative, fatigued and no distress Head: Normocephalic, without obvious abnormality, atraumatic Neck: no adenopathy, no JVD, supple, symmetrical, trachea midline and thyroid not enlarged, symmetric, no tenderness/mass/nodules Lymph nodes: Cervical, supraclavicular, and axillary nodes normal. Resp: clear to  auscultation bilaterally Back: symmetric, no curvature. ROM normal. No CVA tenderness. Cardio: regular rate and rhythm, S1, S2 normal, no murmur, click, rub or gallop GI: soft, non-tender; bowel sounds normal; no masses,  no organomegaly Extremities: extremities normal, atraumatic, no cyanosis or edema  ECOG PERFORMANCE STATUS: 1 - Symptomatic but completely ambulatory  Blood pressure 129/78, pulse 72, temperature (!) 97 F (36.1 C), temperature source Oral, resp. rate 18, height '5\' 8"'  (1.727 m), weight 194 lb 4.8 oz (88.1 kg), SpO2 97 %.  LABORATORY DATA: Lab Results  Component Value Date   WBC 5.2 01/20/2018   HGB 13.3 01/20/2018   HCT 40.3 01/20/2018   MCV 89.2 01/20/2018   PLT 103 (L) 01/20/2018      Chemistry      Component Value Date/Time   NA 139 01/20/2018 1143   K 4.3 01/20/2018 1143   CL 105 01/20/2018 1143   CO2 24 01/20/2018 1143   BUN 18 01/20/2018 1143   CREATININE 1.45 (H) 01/20/2018 1143      Component Value Date/Time   CALCIUM 9.4 01/20/2018 1143   ALKPHOS 115 01/20/2018 1143   AST 18 01/20/2018 1143   ALT 14 01/20/2018 1143   BILITOT 0.5 01/20/2018 1143       RADIOGRAPHIC STUDIES: No results found.  ASSESSMENT AND PLAN: This is a very pleasant 67 years old African female originally from [place] who is visiting her son in [place] and was recently diagnosed with IgA multiple myeloma. The patient was a started initially on treatment with weekly subcutaneous Velcade 1.3 mg/M2, Revlimid 25 mg p.o. daily for 21 days every 4 weeks as well as weekly Decadron 40 mg orally.  She was tolerating the treatment well but she developed significant skin rash secondary to treatment with Revlimid and this was discontinued. The patient continued treatment with subcutaneous Velcade and Decadron and has been tolerating this treatment much better. She is expected to start cycle #4 of this treatment next week. I recommended for the patient to continue her  current treatment as planned. I will see her back for follow-up visit in 1 months for evaluation after repeating myeloma panel for assessment of her disease. The patient was advised to call immediately if she has any concerning symptoms in the interval. The patient voices understanding of current disease status and treatment options and is in agreement with the current care plan.  All questions were answered. The patient knows to call the clinic with any problems, questions or concerns. We can certainly see the patient much sooner if necessary.  I spent 10 minutes counseling the patient face to face. The total time spent in the appointment was155 minutes.  Disclaimer: This note was dictated with voice recognition software. Similar sounding words can inadvertently be transcribed and may not be corrected upon review.

## 2018-01-20 NOTE — Telephone Encounter (Signed)
Printed calendar and avs. °

## 2018-01-20 NOTE — Patient Instructions (Signed)
[facility] Discharge Instructions for Patients Receiving Chemotherapy  Today you received the following chemotherapy agents Velcade.  To help prevent nausea and vomiting after your treatment, we encourage you to take your nausea medication as directed.  If you develop nausea and vomiting that is not controlled by your nausea medication, call the clinic.   BELOW ARE SYMPTOMS THAT SHOULD BE REPORTED IMMEDIATELY:  *FEVER GREATER THAN 100.5 F  *CHILLS WITH OR WITHOUT FEVER  NAUSEA AND VOMITING THAT IS NOT CONTROLLED WITH YOUR NAUSEA MEDICATION  *UNUSUAL SHORTNESS OF BREATH  *UNUSUAL BRUISING OR BLEEDING  TENDERNESS IN MOUTH AND THROAT WITH OR WITHOUT PRESENCE OF ULCERS  *URINARY PROBLEMS  *BOWEL PROBLEMS  UNUSUAL RASH Items with * indicate a potential emergency and should be followed up as soon as possible.  Feel free to call the clinic should you have any questions or concerns. The clinic phone number is [phone].  Please show the CHEMO ALERT CARD at check-in to the Emergency Department and triage nurse.   

## 2018-01-27 ENCOUNTER — Inpatient Hospital Stay: Payer: Self-pay | Attending: Internal Medicine

## 2018-01-27 ENCOUNTER — Inpatient Hospital Stay: Payer: Self-pay

## 2018-01-27 VITALS — BP 132/80 | HR 75 | Temp 98.7°F | Resp 14

## 2018-01-27 DIAGNOSIS — G629 Polyneuropathy, unspecified: Secondary | ICD-10-CM | POA: Insufficient documentation

## 2018-01-27 DIAGNOSIS — Z79899 Other long term (current) drug therapy: Secondary | ICD-10-CM | POA: Insufficient documentation

## 2018-01-27 DIAGNOSIS — C9000 Multiple myeloma not having achieved remission: Secondary | ICD-10-CM | POA: Insufficient documentation

## 2018-01-27 DIAGNOSIS — Z5111 Encounter for antineoplastic chemotherapy: Secondary | ICD-10-CM | POA: Insufficient documentation

## 2018-01-27 DIAGNOSIS — M542 Cervicalgia: Secondary | ICD-10-CM | POA: Insufficient documentation

## 2018-01-27 LAB — CBC WITH DIFFERENTIAL (CANCER CENTER ONLY)
ABS IMMATURE GRANULOCYTES: 0.06 10*3/uL (ref 0.00–0.07)
BASOS ABS: 0 10*3/uL (ref 0.0–0.1)
BASOS PCT: 0 %
EOS ABS: 0 10*3/uL (ref 0.0–0.5)
Eosinophils Relative: 0 %
HCT: 37.9 % (ref 36.0–46.0)
Hemoglobin: 12.6 g/dL (ref 12.0–15.0)
IMMATURE GRANULOCYTES: 1 %
Lymphocytes Relative: 18 %
Lymphs Abs: 0.9 10*3/uL (ref 0.7–4.0)
MCH: 29.4 pg (ref 26.0–34.0)
MCHC: 33.2 g/dL (ref 30.0–36.0)
MCV: 88.6 fL (ref 80.0–100.0)
Monocytes Absolute: 0.1 10*3/uL (ref 0.1–1.0)
Monocytes Relative: 3 %
NEUTROS ABS: 3.9 10*3/uL (ref 1.7–7.7)
NEUTROS PCT: 78 %
NRBC: 0 % (ref 0.0–0.2)
PLATELETS: 78 10*3/uL — AB (ref 150–400)
RBC: 4.28 MIL/uL (ref 3.87–5.11)
RDW: 15.1 % (ref 11.5–15.5)
WBC: 4.9 10*3/uL (ref 4.0–10.5)

## 2018-01-27 LAB — CMP (CANCER CENTER ONLY)
ALBUMIN: 3.9 g/dL (ref 3.5–5.0)
ALT: 15 U/L (ref 0–44)
ANION GAP: 11 (ref 5–15)
AST: 21 U/L (ref 15–41)
Alkaline Phosphatase: 125 U/L (ref 38–126)
BILIRUBIN TOTAL: 0.4 mg/dL (ref 0.3–1.2)
BUN: 20 mg/dL (ref 8–23)
CALCIUM: 9.4 mg/dL (ref 8.9–10.3)
CO2: 21 mmol/L — ABNORMAL LOW (ref 22–32)
Chloride: 108 mmol/L (ref 98–111)
Creatinine: 1.36 mg/dL — ABNORMAL HIGH (ref 0.44–1.00)
GFR, EST AFRICAN AMERICAN: 47 mL/min — AB (ref >60)
GFR, Estimated: 40 mL/min — ABNORMAL LOW (ref >60)
Glucose, Bld: 179 mg/dL — ABNORMAL HIGH (ref 70–99)
POTASSIUM: 4.4 mmol/L (ref 3.5–5.1)
Sodium: 140 mmol/L (ref 135–145)
TOTAL PROTEIN: 7.2 g/dL (ref 6.5–8.1)

## 2018-01-27 MED ORDER — BORTEZOMIB CHEMO SQ INJECTION 3.5 MG (2.5MG/ML)
1.3000 mg/m2 | Freq: Once | INTRAMUSCULAR | Status: AC
  Administered 2018-01-27: 2.75 mg via SUBCUTANEOUS
  Filled 2018-01-27: qty 1.1

## 2018-01-27 MED ORDER — PROCHLORPERAZINE MALEATE 10 MG PO TABS: 10.0000 mg | ORAL_TABLET | Freq: Once | ORAL | Status: DC

## 2018-01-27 MED FILL — ACYCLOVIR 200 MG CAP: 200 | 30 days supply | Qty: 60 | Fill #1

## 2018-01-27 NOTE — Progress Notes (Signed)
Okay to treat with plts 78, per Dr. Julien Nordmann.

## 2018-01-27 NOTE — Patient Instructions (Signed)
[facility] Discharge Instructions for Patients Receiving Chemotherapy  Today you received the following chemotherapy agents Velcade.  To help prevent nausea and vomiting after your treatment, we encourage you to take your nausea medication as directed.  If you develop nausea and vomiting that is not controlled by your nausea medication, call the clinic.   BELOW ARE SYMPTOMS THAT SHOULD BE REPORTED IMMEDIATELY:  *FEVER GREATER THAN 100.5 F  *CHILLS WITH OR WITHOUT FEVER  NAUSEA AND VOMITING THAT IS NOT CONTROLLED WITH YOUR NAUSEA MEDICATION  *UNUSUAL SHORTNESS OF BREATH  *UNUSUAL BRUISING OR BLEEDING  TENDERNESS IN MOUTH AND THROAT WITH OR WITHOUT PRESENCE OF ULCERS  *URINARY PROBLEMS  *BOWEL PROBLEMS  UNUSUAL RASH Items with * indicate a potential emergency and should be followed up as soon as possible.  Feel free to call the clinic should you have any questions or concerns. The clinic phone number is [phone].  Please show the CHEMO ALERT CARD at check-in to the Emergency Department and triage nurse.   

## 2018-02-04 ENCOUNTER — Inpatient Hospital Stay: Payer: Self-pay

## 2018-02-04 VITALS — BP 162/87 | HR 73 | Temp 98.3°F | Resp 18

## 2018-02-04 DIAGNOSIS — C9000 Multiple myeloma not having achieved remission: Secondary | ICD-10-CM

## 2018-02-04 LAB — CMP (CANCER CENTER ONLY)
ALT: 12 U/L (ref 0–44)
AST: 19 U/L (ref 15–41)
Albumin: 3.8 g/dL (ref 3.5–5.0)
Alkaline Phosphatase: 107 U/L (ref 38–126)
Anion gap: 11 (ref 5–15)
BUN: 16 mg/dL (ref 8–23)
CO2: 21 mmol/L — AB (ref 22–32)
Calcium: 9.2 mg/dL (ref 8.9–10.3)
Chloride: 105 mmol/L (ref 98–111)
Creatinine: 1.28 mg/dL — ABNORMAL HIGH (ref 0.44–1.00)
GFR, Est AFR Am: 50 mL/min — ABNORMAL LOW (ref >60)
GFR, Estimated: 43 mL/min — ABNORMAL LOW (ref >60)
Glucose, Bld: 187 mg/dL — ABNORMAL HIGH (ref 70–99)
Potassium: 4.7 mmol/L (ref 3.5–5.1)
Sodium: 137 mmol/L (ref 135–145)
Total Bilirubin: 0.4 mg/dL (ref 0.3–1.2)
Total Protein: 7.2 g/dL (ref 6.5–8.1)

## 2018-02-04 LAB — CBC WITH DIFFERENTIAL (CANCER CENTER ONLY)
Abs Immature Granulocytes: 0.02 10*3/uL (ref 0.00–0.07)
Basophils Absolute: 0 10*3/uL (ref 0.0–0.1)
Basophils Relative: 0 %
Eosinophils Absolute: 0 10*3/uL (ref 0.0–0.5)
Eosinophils Relative: 0 %
HCT: 38.2 % (ref 36.0–46.0)
HEMOGLOBIN: 12.7 g/dL (ref 12.0–15.0)
Immature Granulocytes: 1 %
LYMPHS PCT: 18 %
Lymphs Abs: 0.7 10*3/uL (ref 0.7–4.0)
MCH: 29.5 pg (ref 26.0–34.0)
MCHC: 33.2 g/dL (ref 30.0–36.0)
MCV: 88.6 fL (ref 80.0–100.0)
MONO ABS: 0.1 10*3/uL (ref 0.1–1.0)
Monocytes Relative: 2 %
Neutro Abs: 3.1 10*3/uL (ref 1.7–7.7)
Neutrophils Relative %: 79 %
Platelet Count: 82 10*3/uL — ABNORMAL LOW (ref 150–400)
RBC: 4.31 MIL/uL (ref 3.87–5.11)
RDW: 14.8 % (ref 11.5–15.5)
WBC Count: 3.9 10*3/uL — ABNORMAL LOW (ref 4.0–10.5)
nRBC: 0 % (ref 0.0–0.2)

## 2018-02-04 MED ORDER — PROCHLORPERAZINE MALEATE 10 MG PO TABS
ORAL_TABLET | ORAL | Status: AC
  Filled 2018-02-04: qty 1

## 2018-02-04 MED ORDER — BORTEZOMIB CHEMO SQ INJECTION 3.5 MG (2.5MG/ML)
1.3000 mg/m2 | Freq: Once | INTRAMUSCULAR | Status: AC
  Administered 2018-02-04: 2.75 mg via SUBCUTANEOUS
  Filled 2018-02-04: qty 1.1

## 2018-02-04 MED ORDER — PROCHLORPERAZINE MALEATE 10 MG PO TABS: 10.0000 mg | ORAL_TABLET | Freq: Once | ORAL | Status: DC

## 2018-02-04 NOTE — Progress Notes (Signed)
Per Dr. Julien Nordmann, ok to treat with platelet of 82.

## 2018-02-04 NOTE — Patient Instructions (Signed)
[facility] Discharge Instructions for Patients Receiving Chemotherapy  Today you received the following chemotherapy agents Velcade.  To help prevent nausea and vomiting after your treatment, we encourage you to take your nausea medication as directed.  If you develop nausea and vomiting that is not controlled by your nausea medication, call the clinic.   BELOW ARE SYMPTOMS THAT SHOULD BE REPORTED IMMEDIATELY:  *FEVER GREATER THAN 100.5 F  *CHILLS WITH OR WITHOUT FEVER  NAUSEA AND VOMITING THAT IS NOT CONTROLLED WITH YOUR NAUSEA MEDICATION  *UNUSUAL SHORTNESS OF BREATH  *UNUSUAL BRUISING OR BLEEDING  TENDERNESS IN MOUTH AND THROAT WITH OR WITHOUT PRESENCE OF ULCERS  *URINARY PROBLEMS  *BOWEL PROBLEMS  UNUSUAL RASH Items with * indicate a potential emergency and should be followed up as soon as possible.  Feel free to call the clinic should you have any questions or concerns. The clinic phone number is [phone].  Please show the CHEMO ALERT CARD at check-in to the Emergency Department and triage nurse.   

## 2018-02-10 ENCOUNTER — Telehealth: Payer: Self-pay | Admitting: Medical Oncology

## 2018-02-10 NOTE — Telephone Encounter (Signed)
LVM asking if pt needs interpretor for appointments.

## 2018-02-11 ENCOUNTER — Inpatient Hospital Stay: Payer: Self-pay

## 2018-02-11 ENCOUNTER — Other Ambulatory Visit: Payer: Self-pay

## 2018-02-11 ENCOUNTER — Ambulatory Visit: Payer: Self-pay | Admitting: Oncology

## 2018-02-11 VITALS — BP 152/84 | HR 79 | Temp 97.9°F | Resp 16

## 2018-02-11 DIAGNOSIS — C9000 Multiple myeloma not having achieved remission: Secondary | ICD-10-CM

## 2018-02-11 LAB — CBC WITH DIFFERENTIAL (CANCER CENTER ONLY)
Abs Immature Granulocytes: 0.03 10*3/uL (ref 0.00–0.07)
Basophils Absolute: 0 10*3/uL (ref 0.0–0.1)
Basophils Relative: 0 %
Eosinophils Absolute: 0 10*3/uL (ref 0.0–0.5)
Eosinophils Relative: 1 %
HCT: 40.6 % (ref 36.0–46.0)
Hemoglobin: 13.7 g/dL (ref 12.0–15.0)
Immature Granulocytes: 1 %
LYMPHS ABS: 0.6 10*3/uL — AB (ref 0.7–4.0)
Lymphocytes Relative: 12 %
MCH: 29.8 pg (ref 26.0–34.0)
MCHC: 33.7 g/dL (ref 30.0–36.0)
MCV: 88.5 fL (ref 80.0–100.0)
Monocytes Absolute: 0.1 10*3/uL (ref 0.1–1.0)
Monocytes Relative: 2 %
Neutro Abs: 3.8 10*3/uL (ref 1.7–7.7)
Neutrophils Relative %: 84 %
Platelet Count: 99 10*3/uL — ABNORMAL LOW (ref 150–400)
RBC: 4.59 MIL/uL (ref 3.87–5.11)
RDW: 14.7 % (ref 11.5–15.5)
WBC Count: 4.5 10*3/uL (ref 4.0–10.5)
nRBC: 0 % (ref 0.0–0.2)

## 2018-02-11 LAB — CMP (CANCER CENTER ONLY)
ALT: 12 U/L (ref 0–44)
AST: 16 U/L (ref 15–41)
Albumin: 3.8 g/dL (ref 3.5–5.0)
Alkaline Phosphatase: 115 U/L (ref 38–126)
Anion gap: 11 (ref 5–15)
BUN: 17 mg/dL (ref 8–23)
CO2: 22 mmol/L (ref 22–32)
Calcium: 9.4 mg/dL (ref 8.9–10.3)
Chloride: 106 mmol/L (ref 98–111)
Creatinine: 1.36 mg/dL — ABNORMAL HIGH (ref 0.44–1.00)
GFR, Est AFR Am: 47 mL/min — ABNORMAL LOW (ref >60)
GFR, Estimated: 40 mL/min — ABNORMAL LOW (ref >60)
Glucose, Bld: 238 mg/dL — ABNORMAL HIGH (ref 70–99)
Potassium: 4.3 mmol/L (ref 3.5–5.1)
SODIUM: 139 mmol/L (ref 135–145)
Total Bilirubin: 0.5 mg/dL (ref 0.3–1.2)
Total Protein: 7.3 g/dL (ref 6.5–8.1)

## 2018-02-11 LAB — LACTATE DEHYDROGENASE: LDH: 194 U/L — ABNORMAL HIGH (ref 98–192)

## 2018-02-11 MED ORDER — BORTEZOMIB CHEMO SQ INJECTION 3.5 MG (2.5MG/ML)
1.3000 mg/m2 | Freq: Once | INTRAMUSCULAR | Status: AC
  Administered 2018-02-11: 2.75 mg via SUBCUTANEOUS
  Filled 2018-02-11: qty 1.1

## 2018-02-11 MED ORDER — PROCHLORPERAZINE MALEATE 10 MG PO TABS: 10.0000 mg | ORAL_TABLET | Freq: Once | ORAL | Status: DC

## 2018-02-11 MED FILL — OMEPRAZOLE 40 MG CPDR: 40 | 30 days supply | Qty: 30 | Fill #0

## 2018-02-11 NOTE — Patient Instructions (Signed)
[facility] Discharge Instructions for Patients Receiving Chemotherapy  Today you received the following chemotherapy agents Velcade.  To help prevent nausea and vomiting after your treatment, we encourage you to take your nausea medication as directed.  If you develop nausea and vomiting that is not controlled by your nausea medication, call the clinic.   BELOW ARE SYMPTOMS THAT SHOULD BE REPORTED IMMEDIATELY:  *FEVER GREATER THAN 100.5 F  *CHILLS WITH OR WITHOUT FEVER  NAUSEA AND VOMITING THAT IS NOT CONTROLLED WITH YOUR NAUSEA MEDICATION  *UNUSUAL SHORTNESS OF BREATH  *UNUSUAL BRUISING OR BLEEDING  TENDERNESS IN MOUTH AND THROAT WITH OR WITHOUT PRESENCE OF ULCERS  *URINARY PROBLEMS  *BOWEL PROBLEMS  UNUSUAL RASH Items with * indicate a potential emergency and should be followed up as soon as possible.  Feel free to call the clinic should you have any questions or concerns. The clinic phone number is [phone].  Please show the CHEMO ALERT CARD at check-in to the Emergency Department and triage nurse.   

## 2018-02-11 NOTE — Progress Notes (Signed)
Okay to treat with Plts 99, per [facility].

## 2018-02-12 LAB — KAPPA/LAMBDA LIGHT CHAINS
Kappa free light chain: 7.6 mg/L (ref 3.3–19.4)
Kappa, lambda light chain ratio: 0.02 — ABNORMAL LOW (ref 0.26–1.65)
Lambda free light chains: 407.3 mg/L — ABNORMAL HIGH (ref 5.7–26.3)

## 2018-02-12 LAB — IGG, IGA, IGM
IgA: 1575 mg/dL — ABNORMAL HIGH (ref 87–352)
IgG (Immunoglobin G), Serum: 382 mg/dL — ABNORMAL LOW (ref 700–1600)
IgM (Immunoglobulin M), Srm: 12 mg/dL — ABNORMAL LOW (ref 26–217)

## 2018-02-12 LAB — BETA 2 MICROGLOBULIN, SERUM: BETA 2 MICROGLOBULIN: 2.9 mg/L — AB (ref 0.6–2.4)

## 2018-02-18 ENCOUNTER — Inpatient Hospital Stay (HOSPITAL_BASED_OUTPATIENT_CLINIC_OR_DEPARTMENT_OTHER): Payer: Self-pay | Admitting: Internal Medicine

## 2018-02-18 ENCOUNTER — Other Ambulatory Visit: Payer: Self-pay | Admitting: Medical Oncology

## 2018-02-18 ENCOUNTER — Ambulatory Visit: Payer: Self-pay

## 2018-02-18 ENCOUNTER — Inpatient Hospital Stay: Payer: Self-pay

## 2018-02-18 ENCOUNTER — Other Ambulatory Visit: Payer: Self-pay

## 2018-02-18 ENCOUNTER — Encounter: Payer: Self-pay | Admitting: Internal Medicine

## 2018-02-18 VITALS — BP 137/81 | HR 87 | Temp 98.7°F | Resp 17 | Ht 68.0 in | Wt 193.2 lb

## 2018-02-18 DIAGNOSIS — G629 Polyneuropathy, unspecified: Secondary | ICD-10-CM

## 2018-02-18 DIAGNOSIS — C9000 Multiple myeloma not having achieved remission: Secondary | ICD-10-CM

## 2018-02-18 DIAGNOSIS — N179 Acute kidney failure, unspecified: Secondary | ICD-10-CM

## 2018-02-18 DIAGNOSIS — Z5111 Encounter for antineoplastic chemotherapy: Secondary | ICD-10-CM

## 2018-02-18 DIAGNOSIS — M899 Disorder of bone, unspecified: Secondary | ICD-10-CM

## 2018-02-18 DIAGNOSIS — Z79899 Other long term (current) drug therapy: Secondary | ICD-10-CM

## 2018-02-18 DIAGNOSIS — M542 Cervicalgia: Secondary | ICD-10-CM

## 2018-02-18 LAB — CMP (CANCER CENTER ONLY)
ALT: 14 U/L (ref 0–44)
AST: 18 U/L (ref 15–41)
Albumin: 3.8 g/dL (ref 3.5–5.0)
Alkaline Phosphatase: 104 U/L (ref 38–126)
Anion gap: 9 (ref 5–15)
BUN: 17 mg/dL (ref 8–23)
CO2: 24 mmol/L (ref 22–32)
Calcium: 9.1 mg/dL (ref 8.9–10.3)
Chloride: 106 mmol/L (ref 98–111)
Creatinine: 1.34 mg/dL — ABNORMAL HIGH (ref 0.44–1.00)
GFR, Est AFR Am: 47 mL/min — ABNORMAL LOW (ref >60)
GFR, Estimated: 41 mL/min — ABNORMAL LOW (ref >60)
Glucose, Bld: 106 mg/dL — ABNORMAL HIGH (ref 70–99)
Potassium: 4.4 mmol/L (ref 3.5–5.1)
Sodium: 139 mmol/L (ref 135–145)
Total Bilirubin: 0.7 mg/dL (ref 0.3–1.2)
Total Protein: 7.2 g/dL (ref 6.5–8.1)

## 2018-02-18 LAB — CBC WITH DIFFERENTIAL (CANCER CENTER ONLY)
Abs Immature Granulocytes: 0.03 10*3/uL (ref 0.00–0.07)
Basophils Absolute: 0 10*3/uL (ref 0.0–0.1)
Basophils Relative: 0 %
EOS PCT: 1 %
Eosinophils Absolute: 0 10*3/uL (ref 0.0–0.5)
HCT: 39.5 % (ref 36.0–46.0)
Hemoglobin: 13.3 g/dL (ref 12.0–15.0)
Immature Granulocytes: 1 %
Lymphocytes Relative: 17 %
Lymphs Abs: 1 10*3/uL (ref 0.7–4.0)
MCH: 29.5 pg (ref 26.0–34.0)
MCHC: 33.7 g/dL (ref 30.0–36.0)
MCV: 87.6 fL (ref 80.0–100.0)
Monocytes Absolute: 0.2 10*3/uL (ref 0.1–1.0)
Monocytes Relative: 4 %
Neutro Abs: 4.7 10*3/uL (ref 1.7–7.7)
Neutrophils Relative %: 77 %
Platelet Count: 90 10*3/uL — ABNORMAL LOW (ref 150–400)
RBC: 4.51 MIL/uL (ref 3.87–5.11)
RDW: 14 % (ref 11.5–15.5)
WBC: 6 10*3/uL (ref 4.0–10.5)
nRBC: 0 % (ref 0.0–0.2)

## 2018-02-18 MED ORDER — PROCHLORPERAZINE MALEATE 10 MG PO TABS
ORAL_TABLET | ORAL | Status: AC
  Filled 2018-02-18: qty 1

## 2018-02-18 MED ORDER — BORTEZOMIB CHEMO SQ INJECTION 3.5 MG (2.5MG/ML)
1.3000 mg/m2 | Freq: Once | INTRAMUSCULAR | Status: AC
  Administered 2018-02-18: 2.75 mg via SUBCUTANEOUS
  Filled 2018-02-18: qty 1.1

## 2018-02-18 MED ORDER — PROCHLORPERAZINE MALEATE 10 MG PO TABS: 10.0000 mg | ORAL_TABLET | Freq: Once | ORAL | Status: DC

## 2018-02-18 NOTE — Progress Notes (Signed)
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  PRIOR THERAPY: None  CURRENT THERAPY: Systemic therapy with Velcade 1.3 mg/M2 weekly, Revlimid 25 mg p.o. daily for 14 days every 3 weeks in addition to weekly Decadron 40 mg orally. First dose of treatment 10/08/2017.Treatment was placed on hold due to development of a significant rash.Sheresumedtreatment with dexamethasone and Velcade on 11/19/2017. She will not resume treatment of Revlimid due to the rash.  Status post 4 months of treatment.  INTERVAL HISTORY: Debbie Chan 67 y.o. female returns to the clinic today for follow-up visit.  The patient is feeling fine today with no specific complaints except for intermittent pain in the neck and head area as well as peripheral neuropathy.  She continues to tolerate her treatment with subcutaneous Velcade and Decadron fairly well.  She denied having any nausea, vomiting, diarrhea or constipation.  She denied having any headache or visual changes.  She has no chest pain, shortness of breath, cough or hemoptysis.  She is here today for evaluation with repeat myeloma panel before starting cycle #5 of her treatment.   MEDICAL HISTORY:No past medical history on file.  ALLERGIES:  has No Known Allergies.  MEDICATIONS:  Current Outpatient Medications  Medication Sig Dispense Refill  . acyclovir (ZOVIRAX) 200 MG capsule Take 1 capsule (200 mg total) by mouth 2 (two) times daily. 60 capsule 3  . blood glucose meter kit and supplies Dispense based on patient and insurance preference. Use up to four times daily as directed. (FOR ICD-10 E10.9, E11.9). (Patient not taking: Reported on 11/26/2017) 1 each 0  . dexamethasone (DECADRON) 4 MG tablet Take 10 tablets (40 mg total) by mouth every 7 (seven) days. 40 tablet 3  .  diphenhydrAMINE (BENADRYL ALLERGY) 25 MG tablet Take 1 tablet (25 mg total) by mouth every 6 (six) hours as needed for itching. (Patient not taking: Reported on 11/26/2017) 100 tablet 0  . furosemide (LASIX) 20 MG tablet Take once daily for 3 days for lower extremity edema, then stop. Repeat as needed for recurrent edema. (Patient not taking: Reported on 12/24/2017) 30 tablet 2  . metoprolol tartrate (LOPRESSOR) 25 MG tablet Take 0.5 tablets (12.5 mg total) by mouth 2 (two) times daily. 30 tablet 0  . omeprazole (PRILOSEC) 40 MG capsule Take 1 capsule (40 mg total) by mouth daily. 30 capsule 0  . polyethylene glycol (MIRALAX / GLYCOLAX) packet Take 17 g by mouth daily. (Patient not taking: Reported on 11/26/2017) 14 each 0  . predniSONE (DELTASONE) 20 MG tablet Take 1 1/2 tabs daily X 7 days, then decrease to 1/2 tab daily X 7 days, then stop (Patient not taking: Reported on 12/24/2017) 40 tablet 0  . prochlorperazine (COMPAZINE) 10 MG tablet Take 1 tablet (10 mg total) by mouth every 6 (six) hours as needed for nausea or vomiting. (Patient not taking: Reported on 12/24/2017) 30 tablet 0  . traZODone (DESYREL) 50 MG tablet Take 1 tablet (50 mg total) by mouth at bedtime. (Patient not taking: Reported on 12/24/2017) 30 tablet 0   No current facility-administered medications for this visit.     SURGICAL HISTORY: No past surgical history on file.  REVIEW OF SYSTEMS:  Constitutional: positive for fatigue Eyes: negative Ears, nose, mouth, throat, and face: negative Respiratory: negative Cardiovascular: negative Gastrointestinal: negative Genitourinary:negative Integument/breast: negative Hematologic/lymphatic:  negative Musculoskeletal:positive for neck pain Neurological: positive for headaches and paresthesia Behavioral/Psych: negative Endocrine: negative Allergic/Immunologic: negative   PHYSICAL EXAMINATION: General appearance: alert, cooperative, fatigued and no distress Head: Normocephalic,  without obvious abnormality, atraumatic Neck: no adenopathy, no JVD, supple, symmetrical, trachea midline and thyroid not enlarged, symmetric, no tenderness/mass/nodules Lymph nodes: Cervical, supraclavicular, and axillary nodes normal. Resp: clear to auscultation bilaterally Back: symmetric, no curvature. ROM normal. No CVA tenderness. Cardio: regular rate and rhythm, S1, S2 normal, no murmur, click, rub or gallop GI: soft, non-tender; bowel sounds normal; no masses,  no organomegaly Extremities: extremities normal, atraumatic, no cyanosis or edema Neurologic: Alert and oriented X 3, normal strength and tone. Normal symmetric reflexes. Normal coordination and gait  ECOG PERFORMANCE STATUS: 1 - Symptomatic but completely ambulatory  Blood pressure 137/81, pulse 87, temperature 98.7 F (37.1 C), temperature source Oral, resp. rate 17, height _0  (1.727 m), weight 193 lb 3.2 oz (87.6 kg), SpO2 99 %.  LABORATORY DATA: Lab Results  Component Value Date   WBC 6.0 02/18/2018   HGB 13.3 02/18/2018   HCT 39.5 02/18/2018   MCV 87.6 02/18/2018   PLT 90 (L) 02/18/2018      Chemistry      Component Value Date/Time   NA 139 02/18/2018 1004   K 4.4 02/18/2018 1004   CL 106 02/18/2018 1004   CO2 24 02/18/2018 1004   BUN 17 02/18/2018 1004   CREATININE 1.34 (H) 02/18/2018 1004      Component Value Date/Time   CALCIUM 9.1 02/18/2018 1004   ALKPHOS 104 02/18/2018 1004   AST 18 02/18/2018 1004   ALT 14 02/18/2018 1004   BILITOT 0.7 02/18/2018 1004       RADIOGRAPHIC STUDIES: No results found.  ASSESSMENT AND PLAN: This is a very pleasant 67 years old African female originally from [place] who is visiting her son in [place] and was recently diagnosed with IgA multiple myeloma. The patient was a started initially on treatment with weekly subcutaneous Velcade 1.3 mg/M2, Revlimid 25 mg p.o. daily for 21 days every 4 weeks as well as weekly Decadron 40 mg orally.  She was  tolerating the treatment well but she developed significant skin rash secondary to treatment with Revlimid and this was discontinued. The patient continued treatment with subcutaneous Velcade and Decadron. She is tolerating this treatment well with no concerning adverse effects except for mild peripheral neuropathy. The patient had repeat myeloma panel performed recently.  I personally discussed the lab results with the patient today.  Her labs showed further improvement of her disease. I recommended for the patient to continue her current treatment with weekly subcutaneous Velcade and Decadron.  She will start cycle #5 today. The patient will come back for follow-up visit in 4 weeks for evaluation and management of any adverse effect of her treatment. She was advised to call immediately if she has any concerning symptoms in the interval. The patient voices understanding of current disease status and treatment options and is in agreement with the current care plan.  All questions were answered. The patient knows to call the clinic with any problems, questions or concerns. We can certainly see the patient much sooner if necessary.  Disclaimer: This note was dictated with voice recognition software. Similar sounding words can inadvertently be transcribed and may not be corrected upon review.

## 2018-02-18 NOTE — Progress Notes (Signed)
PEr Dr Julien Nordmann it is okay to treat pt today with Velcade and todays platelet count.

## 2018-02-23 ENCOUNTER — Other Ambulatory Visit: Payer: Self-pay | Admitting: Medical Oncology

## 2018-02-23 DIAGNOSIS — C9000 Multiple myeloma not having achieved remission: Secondary | ICD-10-CM

## 2018-02-25 ENCOUNTER — Inpatient Hospital Stay: Payer: Self-pay | Attending: Internal Medicine

## 2018-02-25 ENCOUNTER — Inpatient Hospital Stay: Payer: Self-pay

## 2018-02-25 VITALS — BP 134/78 | HR 80 | Temp 98.0°F | Resp 18

## 2018-02-25 DIAGNOSIS — R200 Anesthesia of skin: Secondary | ICD-10-CM | POA: Insufficient documentation

## 2018-02-25 DIAGNOSIS — Z5111 Encounter for antineoplastic chemotherapy: Secondary | ICD-10-CM | POA: Insufficient documentation

## 2018-02-25 DIAGNOSIS — C9000 Multiple myeloma not having achieved remission: Secondary | ICD-10-CM | POA: Insufficient documentation

## 2018-02-25 DIAGNOSIS — R21 Rash and other nonspecific skin eruption: Secondary | ICD-10-CM | POA: Insufficient documentation

## 2018-02-25 DIAGNOSIS — H1033 Unspecified acute conjunctivitis, bilateral: Secondary | ICD-10-CM | POA: Insufficient documentation

## 2018-02-25 LAB — CBC WITH DIFFERENTIAL (CANCER CENTER ONLY)
Abs Immature Granulocytes: 0.02 10*3/uL (ref 0.00–0.07)
Basophils Absolute: 0 10*3/uL (ref 0.0–0.1)
Basophils Relative: 0 %
EOS ABS: 0.1 10*3/uL (ref 0.0–0.5)
Eosinophils Relative: 1 %
HCT: 40.1 % (ref 36.0–46.0)
Hemoglobin: 13.3 g/dL (ref 12.0–15.0)
Immature Granulocytes: 0 %
Lymphocytes Relative: 18 %
Lymphs Abs: 1 10*3/uL (ref 0.7–4.0)
MCH: 29.7 pg (ref 26.0–34.0)
MCHC: 33.2 g/dL (ref 30.0–36.0)
MCV: 89.5 fL (ref 80.0–100.0)
Monocytes Absolute: 0.3 10*3/uL (ref 0.1–1.0)
Monocytes Relative: 6 %
NEUTROS PCT: 75 %
Neutro Abs: 4.3 10*3/uL (ref 1.7–7.7)
Platelet Count: 122 10*3/uL — ABNORMAL LOW (ref 150–400)
RBC: 4.48 MIL/uL (ref 3.87–5.11)
RDW: 14 % (ref 11.5–15.5)
WBC Count: 5.7 10*3/uL (ref 4.0–10.5)
nRBC: 0 % (ref 0.0–0.2)

## 2018-02-25 LAB — CMP (CANCER CENTER ONLY)
ALT: 12 U/L (ref 0–44)
AST: 19 U/L (ref 15–41)
Albumin: 3.6 g/dL (ref 3.5–5.0)
Alkaline Phosphatase: 105 U/L (ref 38–126)
Anion gap: 9 (ref 5–15)
BUN: 15 mg/dL (ref 8–23)
CO2: 26 mmol/L (ref 22–32)
Calcium: 9.2 mg/dL (ref 8.9–10.3)
Chloride: 104 mmol/L (ref 98–111)
Creatinine: 1.29 mg/dL — ABNORMAL HIGH (ref 0.44–1.00)
GFR, EST NON AFRICAN AMERICAN: 43 mL/min — AB (ref >60)
GFR, Est AFR Am: 50 mL/min — ABNORMAL LOW (ref >60)
Glucose, Bld: 66 mg/dL — ABNORMAL LOW (ref 70–99)
POTASSIUM: 4.3 mmol/L (ref 3.5–5.1)
Sodium: 139 mmol/L (ref 135–145)
Total Bilirubin: 0.5 mg/dL (ref 0.3–1.2)
Total Protein: 7.1 g/dL (ref 6.5–8.1)

## 2018-02-25 MED ORDER — BORTEZOMIB CHEMO SQ INJECTION 3.5 MG (2.5MG/ML)
1.3000 mg/m2 | Freq: Once | INTRAMUSCULAR | Status: AC
  Administered 2018-02-25: 2.75 mg via SUBCUTANEOUS
  Filled 2018-02-25: qty 1.1

## 2018-02-25 MED ORDER — PROCHLORPERAZINE MALEATE 10 MG PO TABS: 10.0000 mg | ORAL_TABLET | Freq: Once | ORAL | Status: DC

## 2018-02-25 MED FILL — ACYCLOVIR 200 MG CAP: 200 | 30 days supply | Qty: 60 | Fill #2

## 2018-02-25 MED FILL — DEXAMETHASONE 4 MG TABLET: 4 | 28 days supply | Qty: 40 | Fill #0

## 2018-02-25 NOTE — Patient Instructions (Signed)
[facility] Discharge Instructions for Patients Receiving Chemotherapy  Today you received the following chemotherapy agents Velcade.  To help prevent nausea and vomiting after your treatment, we encourage you to take your nausea medication as directed.  If you develop nausea and vomiting that is not controlled by your nausea medication, call the clinic.   BELOW ARE SYMPTOMS THAT SHOULD BE REPORTED IMMEDIATELY:  *FEVER GREATER THAN 100.5 F  *CHILLS WITH OR WITHOUT FEVER  NAUSEA AND VOMITING THAT IS NOT CONTROLLED WITH YOUR NAUSEA MEDICATION  *UNUSUAL SHORTNESS OF BREATH  *UNUSUAL BRUISING OR BLEEDING  TENDERNESS IN MOUTH AND THROAT WITH OR WITHOUT PRESENCE OF ULCERS  *URINARY PROBLEMS  *BOWEL PROBLEMS  UNUSUAL RASH Items with * indicate a potential emergency and should be followed up as soon as possible.  Feel free to call the clinic should you have any questions or concerns. The clinic phone number is [phone].  Please show the CHEMO ALERT CARD at check-in to the Emergency Department and triage nurse.   

## 2018-03-03 ENCOUNTER — Other Ambulatory Visit: Payer: Self-pay | Admitting: *Deleted

## 2018-03-03 DIAGNOSIS — Z5111 Encounter for antineoplastic chemotherapy: Secondary | ICD-10-CM

## 2018-03-04 ENCOUNTER — Inpatient Hospital Stay: Payer: Self-pay

## 2018-03-04 ENCOUNTER — Other Ambulatory Visit: Payer: Self-pay | Admitting: Medical

## 2018-03-04 ENCOUNTER — Inpatient Hospital Stay (HOSPITAL_BASED_OUTPATIENT_CLINIC_OR_DEPARTMENT_OTHER): Payer: Self-pay | Admitting: Medical

## 2018-03-04 VITALS — BP 145/79 | HR 81 | Temp 98.2°F | Resp 18

## 2018-03-04 DIAGNOSIS — C9000 Multiple myeloma not having achieved remission: Secondary | ICD-10-CM

## 2018-03-04 DIAGNOSIS — H1033 Unspecified acute conjunctivitis, bilateral: Secondary | ICD-10-CM

## 2018-03-04 DIAGNOSIS — Z5111 Encounter for antineoplastic chemotherapy: Secondary | ICD-10-CM

## 2018-03-04 LAB — CBC WITH DIFFERENTIAL (CANCER CENTER ONLY)
Abs Immature Granulocytes: 0.01 10*3/uL (ref 0.00–0.07)
BASOS ABS: 0 10*3/uL (ref 0.0–0.1)
Basophils Relative: 0 %
Eosinophils Absolute: 0.1 10*3/uL (ref 0.0–0.5)
Eosinophils Relative: 1 %
HCT: 39.3 % (ref 36.0–46.0)
Hemoglobin: 13.2 g/dL (ref 12.0–15.0)
IMMATURE GRANULOCYTES: 0 %
LYMPHS PCT: 21 %
Lymphs Abs: 1 10*3/uL (ref 0.7–4.0)
MCH: 30 pg (ref 26.0–34.0)
MCHC: 33.6 g/dL (ref 30.0–36.0)
MCV: 89.3 fL (ref 80.0–100.0)
Monocytes Absolute: 0.3 10*3/uL (ref 0.1–1.0)
Monocytes Relative: 7 %
NRBC: 0 % (ref 0.0–0.2)
Neutro Abs: 3.2 10*3/uL (ref 1.7–7.7)
Neutrophils Relative %: 71 %
Platelet Count: 104 10*3/uL — ABNORMAL LOW (ref 150–400)
RBC: 4.4 MIL/uL (ref 3.87–5.11)
RDW: 13.7 % (ref 11.5–15.5)
WBC Count: 4.6 10*3/uL (ref 4.0–10.5)

## 2018-03-04 LAB — CMP (CANCER CENTER ONLY)
ALT: 12 U/L (ref 0–44)
AST: 18 U/L (ref 15–41)
Albumin: 3.6 g/dL (ref 3.5–5.0)
Alkaline Phosphatase: 95 U/L (ref 38–126)
Anion gap: 10 (ref 5–15)
BUN: 13 mg/dL (ref 8–23)
CO2: 24 mmol/L (ref 22–32)
Calcium: 9 mg/dL (ref 8.9–10.3)
Chloride: 105 mmol/L (ref 98–111)
Creatinine: 1.22 mg/dL — ABNORMAL HIGH (ref 0.44–1.00)
GFR, Est AFR Am: 53 mL/min — ABNORMAL LOW (ref >60)
GFR, Estimated: 46 mL/min — ABNORMAL LOW (ref >60)
Glucose, Bld: 86 mg/dL (ref 70–99)
Potassium: 4.1 mmol/L (ref 3.5–5.1)
Sodium: 139 mmol/L (ref 135–145)
Total Bilirubin: 0.5 mg/dL (ref 0.3–1.2)
Total Protein: 7 g/dL (ref 6.5–8.1)

## 2018-03-04 MED ORDER — BORTEZOMIB CHEMO SQ INJECTION 3.5 MG (2.5MG/ML)
1.3000 mg/m2 | Freq: Once | INTRAMUSCULAR | Status: AC
  Administered 2018-03-04: 2.75 mg via SUBCUTANEOUS
  Filled 2018-03-04: qty 1.1

## 2018-03-04 MED ORDER — PROCHLORPERAZINE MALEATE 10 MG PO TABS: 10.0000 mg | ORAL_TABLET | Freq: Once | ORAL | Status: DC

## 2018-03-04 MED ORDER — TOBRAMYCIN 0.3 % OP SOLN: 1.0000 [drp] | Freq: Four times a day (QID) | OPHTHALMIC | 0 refills | Status: AC

## 2018-03-04 MED FILL — TOBRAMYCIN 0.3 % SOLN: 0.3 | 12 days supply | Qty: 5 | Fill #0

## 2018-03-04 NOTE — Progress Notes (Signed)
Sandi Mealy, PA to see pt for complaint of eye swelling.

## 2018-03-04 NOTE — Patient Instructions (Signed)
[facility] Discharge Instructions for Patients Receiving Chemotherapy  Today you received the following chemotherapy agents Velcade.  To help prevent nausea and vomiting after your treatment, we encourage you to take your nausea medication as directed.  If you develop nausea and vomiting that is not controlled by your nausea medication, call the clinic.   BELOW ARE SYMPTOMS THAT SHOULD BE REPORTED IMMEDIATELY:  *FEVER GREATER THAN 100.5 F  *CHILLS WITH OR WITHOUT FEVER  NAUSEA AND VOMITING THAT IS NOT CONTROLLED WITH YOUR NAUSEA MEDICATION  *UNUSUAL SHORTNESS OF BREATH  *UNUSUAL BRUISING OR BLEEDING  TENDERNESS IN MOUTH AND THROAT WITH OR WITHOUT PRESENCE OF ULCERS  *URINARY PROBLEMS  *BOWEL PROBLEMS  UNUSUAL RASH Items with * indicate a potential emergency and should be followed up as soon as possible.  Feel free to call the clinic should you have any questions or concerns. The clinic phone number is [phone].  Please show the CHEMO ALERT CARD at check-in to the Emergency Department and triage nurse.   

## 2018-03-08 NOTE — Progress Notes (Signed)
[facility] Progress Note   Debbie Chan [id number] 02-Oct-1950 68 y.o.  Debbie Chan is managed by Dr. Julien Nordmann  Actively treated with chemotherapy/immunotherapy/hormonal therapy: yes  Current Therapy: Velcade and Revlimid  Last Treated: 02/25/2018 (cycle 6, day 8)  Assessment: Plan:    Acute conjunctivitis of both eyes, unspecified acute conjunctivitis type  Multiple myeloma without remission ([place])   Conjunctivitis: The patient was given a prescription for TobraDex ophthalmic solution with instructions to use 1 drop in both eyes every 6 hours.  This was discussed with the patient's daughter.  She was instructed to continue the medication for 3 days past when she notes improvement.  Multiple myeloma without remission the patient continues to be managed by Dr. Julien Nordmann and is treated with Revlimid and Velcade.  Her last treatment was on 02/25/2018.  Please see After Visit Summary for patient specific instructions.  Future Appointments  Date Time Provider [place]  03/11/2018  9:15 AM [facility] LAB 2 [facility] None  03/11/2018  9:45 AM [facility] INFUSION [facility] None  03/18/2018  9:15 AM [facility] LAB 2 [facility] None  03/18/2018  9:45 AM Owens Shark, NP [facility] None  03/18/2018 10:30 AM [facility] INFUSION [facility] None  03/25/2018  9:15 AM [facility] LAB 2 [facility] None  03/25/2018  9:45 AM [facility] INFUSION [facility] None  04/01/2018  9:15 AM [facility] LAB 3 [facility] None  04/01/2018  9:45 AM [facility] INFUSION [facility] None  04/08/2018  9:15 AM [facility] LAB 3 [facility] None  04/08/2018  9:45 AM [facility] INFUSION [facility] None  04/15/2018  8:45 AM [facility] LAB 3 [facility] None  04/15/2018  9:15 AM [facility] LAB 3 [facility] None  04/15/2018  9:45 AM [facility] INFUSION [facility] None    No orders of the defined types were placed in this encounter.      Subjective:   Patient ID:  Debbie Chan  is a 68 y.o. (DOB [birth year]) female.  Chief Complaint: No chief complaint on file.   HPI Debbie Chan is a 68 year old female with a multiple myeloma which is not in remission she is managed by Dr. Julien Nordmann and is treated with Velcade and Revlimid.  Her last treatment was completed on 02/25/2018.  She has irritation in both of her eyes.  She states that her eyes have been matting especially on waking in the morning.  She has to force open her eyes manually.  She denies fevers, chills, or sweats.  Medications: I have reviewed the patient's current medications.  Allergies: No Known Allergies  No past medical history on file.  No past surgical history on file.  No family history on file.  Social History   Socioeconomic History  . Marital status: Widowed    Spouse name: Not on file  . Number of children: 4  . Years of education: Not on file  . Highest education level: Not on file  Occupational History  . Not on file  Social Needs  . Financial resource strain: Not on file  . Food insecurity:    Worry: Not on file    Inability: Not on file  . Transportation needs:    Medical: Not on file    Non-medical: Not on file  Tobacco Use  . Smoking status: Never Smoker  . Smokeless tobacco: Never Used  Substance and Sexual Activity  . Alcohol use: Never    Frequency: Never  . Drug use: Never  . Sexual activity: Not on file  Lifestyle  . Physical activity:    Days per  week: Not on file    Minutes per session: Not on file  . Stress: Not on file  Relationships  . Social connections:    Talks on phone: Not on file    Gets together: Not on file    Attends religious service: Not on file    Active member of club or organization: Not on file    Attends meetings of clubs or organizations: Not on file    Relationship status: Not on file  . Intimate partner violence:    Fear of current or ex partner: Not on file    Emotionally abused: Not on file    Physically abused: Not on file     Forced sexual activity: Not on file  Other Topics Concern  . Not on file  Social History Narrative  . Not on file    Past Medical History, Surgical history, Social history, and Family history were reviewed and updated as appropriate.   Please see review of systems for further details on the patient's review from today.   Review of Systems:  Review of Systems  Constitutional: Negative for chills, diaphoresis and fever.  Eyes: Positive for pain, discharge, redness and itching.    Objective:   Physical Exam:  There were no vitals taken for this visit. ECOG: 1  Physical Exam Constitutional:      General: She is not in acute distress.    Appearance: She is not diaphoretic.  HENT:     Head: Normocephalic and atraumatic.  Eyes:     General: No scleral icterus.       Right eye: No foreign body or discharge.        Left eye: No foreign body or discharge.     Conjunctiva/sclera:     Right eye: Right conjunctiva is injected. No exudate.    Left eye: Left conjunctiva is injected. No exudate.    Pupils: Pupils are equal.     Right eye: Pupil is round and reactive.     Left eye: Pupil is round and reactive.     Funduscopic exam:    Right eye: No hemorrhage, exudate or papilledema. Red reflex present.        Left eye: No hemorrhage or papilledema. Red reflex present. Cardiovascular:     Rate and Rhythm: Normal rate and regular rhythm.     Heart sounds: Normal heart sounds. No murmur. No friction rub. No gallop.   Pulmonary:     Effort: Pulmonary effort is normal. No respiratory distress.     Breath sounds: Normal breath sounds. No wheezing or rales.  Neurological:     Mental Status: She is alert.     Lab Review:     Component Value Date/Time   NA 139 03/04/2018 0924   K 4.1 03/04/2018 0924   CL 105 03/04/2018 0924   CO2 24 03/04/2018 0924   GLUCOSE 86 03/04/2018 0924   BUN 13 03/04/2018 0924   CREATININE 1.22 (H) 03/04/2018 0924   CALCIUM 9.0 03/04/2018 0924   PROT  7.0 03/04/2018 0924   ALBUMIN 3.6 03/04/2018 0924   AST 18 03/04/2018 0924   ALT 12 03/04/2018 0924   ALKPHOS 95 03/04/2018 0924   BILITOT 0.5 03/04/2018 0924   GFRNONAA 46 (L) 03/04/2018 0924   GFRAA 53 (L) 03/04/2018 0924       Component Value Date/Time   WBC 4.6 03/04/2018 0924   WBC 11.8 (H) 09/27/2017 0215   RBC 4.40 03/04/2018 0924   HGB 13.2  03/04/2018 0924   HCT 39.3 03/04/2018 0924   PLT 104 (L) 03/04/2018 0924   MCV 89.3 03/04/2018 0924   MCH 30.0 03/04/2018 0924   MCHC 33.6 03/04/2018 0924   RDW 13.7 03/04/2018 0924   LYMPHSABS 1.0 03/04/2018 0924   MONOABS 0.3 03/04/2018 0924   EOSABS 0.1 03/04/2018 0924   BASOSABS 0.0 03/04/2018 0924   -------------------------------  Imaging from last 24 hours (if applicable):  Radiology interpretation: No results found.

## 2018-03-09 ENCOUNTER — Other Ambulatory Visit: Payer: Self-pay | Admitting: Medical Oncology

## 2018-03-11 ENCOUNTER — Inpatient Hospital Stay: Payer: Self-pay

## 2018-03-11 ENCOUNTER — Other Ambulatory Visit: Payer: Self-pay | Admitting: *Deleted

## 2018-03-11 VITALS — BP 141/74 | HR 74 | Temp 98.6°F | Resp 18 | Ht 68.0 in | Wt 190.4 lb

## 2018-03-11 DIAGNOSIS — C9000 Multiple myeloma not having achieved remission: Secondary | ICD-10-CM

## 2018-03-11 DIAGNOSIS — H1033 Unspecified acute conjunctivitis, bilateral: Secondary | ICD-10-CM

## 2018-03-11 LAB — CMP (CANCER CENTER ONLY)
ALT: 9 U/L (ref 0–44)
AST: 16 U/L (ref 15–41)
Albumin: 3.5 g/dL (ref 3.5–5.0)
Alkaline Phosphatase: 98 U/L (ref 38–126)
Anion gap: 10 (ref 5–15)
BUN: 19 mg/dL (ref 8–23)
CO2: 25 mmol/L (ref 22–32)
Calcium: 9 mg/dL (ref 8.9–10.3)
Chloride: 105 mmol/L (ref 98–111)
Creatinine: 1.25 mg/dL — ABNORMAL HIGH (ref 0.44–1.00)
GFR, Est AFR Am: 52 mL/min — ABNORMAL LOW (ref >60)
GFR, Estimated: 44 mL/min — ABNORMAL LOW (ref >60)
Glucose, Bld: 90 mg/dL (ref 70–99)
Potassium: 4.3 mmol/L (ref 3.5–5.1)
Sodium: 140 mmol/L (ref 135–145)
Total Bilirubin: 0.5 mg/dL (ref 0.3–1.2)
Total Protein: 6.7 g/dL (ref 6.5–8.1)

## 2018-03-11 LAB — CBC WITH DIFFERENTIAL (CANCER CENTER ONLY)
ABS IMMATURE GRANULOCYTES: 0.02 10*3/uL (ref 0.00–0.07)
Basophils Absolute: 0 10*3/uL (ref 0.0–0.1)
Basophils Relative: 0 %
Eosinophils Absolute: 0.1 10*3/uL (ref 0.0–0.5)
Eosinophils Relative: 1 %
HEMATOCRIT: 39.8 % (ref 36.0–46.0)
Hemoglobin: 13.3 g/dL (ref 12.0–15.0)
Immature Granulocytes: 1 %
LYMPHS ABS: 1.2 10*3/uL (ref 0.7–4.0)
Lymphocytes Relative: 29 %
MCH: 29.8 pg (ref 26.0–34.0)
MCHC: 33.4 g/dL (ref 30.0–36.0)
MCV: 89 fL (ref 80.0–100.0)
Monocytes Absolute: 0.3 10*3/uL (ref 0.1–1.0)
Monocytes Relative: 8 %
Neutro Abs: 2.6 10*3/uL (ref 1.7–7.7)
Neutrophils Relative %: 61 %
Platelet Count: 97 10*3/uL — ABNORMAL LOW (ref 150–400)
RBC: 4.47 MIL/uL (ref 3.87–5.11)
RDW: 13.8 % (ref 11.5–15.5)
WBC Count: 4.2 10*3/uL (ref 4.0–10.5)
nRBC: 0 % (ref 0.0–0.2)

## 2018-03-11 MED ORDER — BORTEZOMIB CHEMO SQ INJECTION 3.5 MG (2.5MG/ML)
1.3000 mg/m2 | Freq: Once | INTRAMUSCULAR | Status: AC
  Administered 2018-03-11: 2.75 mg via SUBCUTANEOUS
  Filled 2018-03-11: qty 1.1

## 2018-03-11 MED ORDER — PROCHLORPERAZINE MALEATE 10 MG PO TABS: 10.0000 mg | ORAL_TABLET | Freq: Once | ORAL | Status: DC

## 2018-03-11 NOTE — Patient Instructions (Signed)
[facility] Discharge Instructions for Patients Receiving Chemotherapy  Today you received the following chemotherapy agents Velcade.  To help prevent nausea and vomiting after your treatment, we encourage you to take your nausea medication as directed.  If you develop nausea and vomiting that is not controlled by your nausea medication, call the clinic.   BELOW ARE SYMPTOMS THAT SHOULD BE REPORTED IMMEDIATELY:  *FEVER GREATER THAN 100.5 F  *CHILLS WITH OR WITHOUT FEVER  NAUSEA AND VOMITING THAT IS NOT CONTROLLED WITH YOUR NAUSEA MEDICATION  *UNUSUAL SHORTNESS OF BREATH  *UNUSUAL BRUISING OR BLEEDING  TENDERNESS IN MOUTH AND THROAT WITH OR WITHOUT PRESENCE OF ULCERS  *URINARY PROBLEMS  *BOWEL PROBLEMS  UNUSUAL RASH Items with * indicate a potential emergency and should be followed up as soon as possible.  Feel free to call the clinic should you have any questions or concerns. The clinic phone number is [phone].  Please show the CHEMO ALERT CARD at check-in to the Emergency Department and triage nurse.   

## 2018-03-11 NOTE — Progress Notes (Signed)
Per Dr. Julien Nordmann it is ok to treat with Velcade today with platelet count of 97.

## 2018-03-18 ENCOUNTER — Encounter: Payer: Self-pay | Admitting: Nurse Practitioner

## 2018-03-18 ENCOUNTER — Inpatient Hospital Stay: Payer: Self-pay

## 2018-03-18 ENCOUNTER — Ambulatory Visit: Payer: Self-pay

## 2018-03-18 ENCOUNTER — Other Ambulatory Visit: Payer: Self-pay | Admitting: *Deleted

## 2018-03-18 ENCOUNTER — Inpatient Hospital Stay (HOSPITAL_BASED_OUTPATIENT_CLINIC_OR_DEPARTMENT_OTHER): Payer: Self-pay | Admitting: Nurse Practitioner

## 2018-03-18 VITALS — BP 120/64 | HR 82 | Temp 98.1°F | Resp 18 | Ht 68.0 in | Wt 193.8 lb

## 2018-03-18 DIAGNOSIS — Z5111 Encounter for antineoplastic chemotherapy: Secondary | ICD-10-CM

## 2018-03-18 DIAGNOSIS — C9000 Multiple myeloma not having achieved remission: Secondary | ICD-10-CM

## 2018-03-18 DIAGNOSIS — H1033 Unspecified acute conjunctivitis, bilateral: Secondary | ICD-10-CM

## 2018-03-18 DIAGNOSIS — R200 Anesthesia of skin: Secondary | ICD-10-CM

## 2018-03-18 DIAGNOSIS — R21 Rash and other nonspecific skin eruption: Secondary | ICD-10-CM

## 2018-03-18 LAB — CMP (CANCER CENTER ONLY)
ALT: 12 U/L (ref 0–44)
AST: 17 U/L (ref 15–41)
Albumin: 3.5 g/dL (ref 3.5–5.0)
Alkaline Phosphatase: 92 U/L (ref 38–126)
Anion gap: 9 (ref 5–15)
BUN: 11 mg/dL (ref 8–23)
CO2: 27 mmol/L (ref 22–32)
Calcium: 9 mg/dL (ref 8.9–10.3)
Chloride: 105 mmol/L (ref 98–111)
Creatinine: 1.16 mg/dL — ABNORMAL HIGH (ref 0.44–1.00)
GFR, Est AFR Am: 56 mL/min — ABNORMAL LOW (ref >60)
GFR, Estimated: 49 mL/min — ABNORMAL LOW (ref >60)
Glucose, Bld: 80 mg/dL (ref 70–99)
Potassium: 3.9 mmol/L (ref 3.5–5.1)
SODIUM: 141 mmol/L (ref 135–145)
Total Bilirubin: 0.5 mg/dL (ref 0.3–1.2)
Total Protein: 7 g/dL (ref 6.5–8.1)

## 2018-03-18 LAB — CBC WITH DIFFERENTIAL (CANCER CENTER ONLY)
Abs Immature Granulocytes: 0.02 10*3/uL (ref 0.00–0.07)
Basophils Absolute: 0 10*3/uL (ref 0.0–0.1)
Basophils Relative: 1 %
Eosinophils Absolute: 0.1 10*3/uL (ref 0.0–0.5)
Eosinophils Relative: 2 %
HCT: 38.7 % (ref 36.0–46.0)
HEMOGLOBIN: 13 g/dL (ref 12.0–15.0)
Immature Granulocytes: 1 %
Lymphocytes Relative: 26 %
Lymphs Abs: 0.9 10*3/uL (ref 0.7–4.0)
MCH: 30 pg (ref 26.0–34.0)
MCHC: 33.6 g/dL (ref 30.0–36.0)
MCV: 89.4 fL (ref 80.0–100.0)
Monocytes Absolute: 0.2 10*3/uL (ref 0.1–1.0)
Monocytes Relative: 6 %
NEUTROS ABS: 2.4 10*3/uL (ref 1.7–7.7)
NEUTROS PCT: 64 %
Platelet Count: 121 10*3/uL — ABNORMAL LOW (ref 150–400)
RBC: 4.33 MIL/uL (ref 3.87–5.11)
RDW: 13.7 % (ref 11.5–15.5)
WBC Count: 3.6 10*3/uL — ABNORMAL LOW (ref 4.0–10.5)
nRBC: 0 % (ref 0.0–0.2)

## 2018-03-18 MED ORDER — BORTEZOMIB CHEMO SQ INJECTION 3.5 MG (2.5MG/ML)
1.3000 mg/m2 | Freq: Once | INTRAMUSCULAR | Status: AC
  Administered 2018-03-18: 2.75 mg via SUBCUTANEOUS
  Filled 2018-03-18: qty 1.1

## 2018-03-18 MED ORDER — PROCHLORPERAZINE MALEATE 10 MG PO TABS: 10.0000 mg | ORAL_TABLET | Freq: Once | ORAL | Status: DC

## 2018-03-18 MED ORDER — PROCHLORPERAZINE MALEATE 10 MG PO TABS
ORAL_TABLET | ORAL | Status: AC
  Filled 2018-03-18: qty 1

## 2018-03-18 MED FILL — OMEPRAZOLE 40 MG CPDR: 40 | 30 days supply | Qty: 30 | Fill #1

## 2018-03-18 MED FILL — DEXAMETHASONE 4 MG TABLET: 4 | 28 days supply | Qty: 40 | Fill #1

## 2018-03-18 NOTE — Patient Instructions (Signed)
[facility] Discharge Instructions for Patients Receiving Chemotherapy  Today you received the following chemotherapy agents Velcade.  To help prevent nausea and vomiting after your treatment, we encourage you to take your nausea medication as directed.  If you develop nausea and vomiting that is not controlled by your nausea medication, call the clinic.   BELOW ARE SYMPTOMS THAT SHOULD BE REPORTED IMMEDIATELY:  *FEVER GREATER THAN 100.5 F  *CHILLS WITH OR WITHOUT FEVER  NAUSEA AND VOMITING THAT IS NOT CONTROLLED WITH YOUR NAUSEA MEDICATION  *UNUSUAL SHORTNESS OF BREATH  *UNUSUAL BRUISING OR BLEEDING  TENDERNESS IN MOUTH AND THROAT WITH OR WITHOUT PRESENCE OF ULCERS  *URINARY PROBLEMS  *BOWEL PROBLEMS  UNUSUAL RASH Items with * indicate a potential emergency and should be followed up as soon as possible.  Feel free to call the clinic should you have any questions or concerns. The clinic phone number is [phone].  Please show the CHEMO ALERT CARD at check-in to the Emergency Department and triage nurse.   

## 2018-03-18 NOTE — Progress Notes (Signed)
  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  PRIOR THERAPY: None  CURRENT THERAPY: Systemic therapy with Velcade 1.3 mg/M2 weekly, Revlimid 25 mg p.o. daily for 14 days every 3 weeks in addition to weekly Decadron 40 mg orally. First dose of treatment 10/08/2017.Treatment was placed on hold due to development of a significant rash.Sheresumedtreatment with dexamethasone and Velcade on 11/19/2017. She will not resume treatment of Revlimid due to the rash.Status post 5 months of treatment.   INTERVAL HISTORY:   [name] returns as scheduled.  She completed another cycle of Velcade/dexamethasone beginning 02/18/2018.  She denies nausea/vomiting.  No constipation.  She has periodic numbness in her feet.  She was seen in the [facility] 03/04/2018 with bilateral acute conjunctivitis she was prescribed TobraDex eyedrops.  Symptoms improved.  She discontinued the TobraDex at 5 days, symptoms have recurred.  She denies a visual disturbance.  Objective:  Vital signs in last 24 hours:  Blood pressure 120/64, pulse 82, temperature 98.1 F (36.7 C), temperature source Oral, resp. rate 18, height 5' 8" (1.727 m), weight 193 lb 12.8 oz (87.9 kg), SpO2 99 %.    HEENT: Bilateral conjunctival injection.  No thrush or ulcers. Resp: Lungs clear bilaterally. Cardio: Regular rate and rhythm. GI: Abdomen soft and nontender.  No hepatomegaly. Vascular: No leg edema.   Lab Results:  Lab Results  Component Value Date   WBC 3.6 (L) 03/18/2018   HGB 13.0 03/18/2018   HCT 38.7 03/18/2018   MCV 89.4 03/18/2018   PLT 121 (L) 03/18/2018   NEUTROABS 2.4 03/18/2018    Imaging:  No results found.  Medications: I have reviewed the patient's current medications.  Assessment/Plan: 1. IgA multiple myeloma diagnosed July 2019.  Initially treated with weekly subcutaneous Velcade/Revlimid/dexamethasone.  Revlimid  discontinued due to a significant skin rash.  Subcutaneous Velcade and dexamethasone continued.  Most recent repeat myeloma panel showed further improvement.  This was reviewed with her at the time of her last visit with Dr. Earlie Server 02/18/2018.  The recommendation was to continue weekly subcutaneous Velcade and dexamethasone.  Plan to proceed with the next cycle beginning today as scheduled. 2. Recent bilateral conjunctivitis status post tobramycin with initial improvement.  Symptoms recurred upon premature discontinuation of the recommended course of treatment.  She will resume the tobramycin eyedrops as previously prescribed.  Disposition: Debbie Chan appears stable.  Plan to continue weekly subcutaneous Velcade and dexamethasone.    We reviewed the CBC from today.  Counts are adequate for treatment.    She will return for a follow-up visit with Dr. Julien Nordmann in 4 weeks.  She will contact the office in the interim with any problems.  Plan reviewed with Dr. Julien Nordmann.    Ned Card ANP/GNP-BC   03/18/2018  10:06 AM

## 2018-03-25 ENCOUNTER — Other Ambulatory Visit: Payer: Self-pay | Admitting: Medical

## 2018-03-25 ENCOUNTER — Inpatient Hospital Stay: Payer: Self-pay

## 2018-03-25 ENCOUNTER — Other Ambulatory Visit: Payer: Self-pay | Admitting: *Deleted

## 2018-03-25 VITALS — BP 130/69 | HR 81 | Temp 98.4°F | Resp 17

## 2018-03-25 DIAGNOSIS — C9000 Multiple myeloma not having achieved remission: Secondary | ICD-10-CM

## 2018-03-25 DIAGNOSIS — Z5111 Encounter for antineoplastic chemotherapy: Secondary | ICD-10-CM

## 2018-03-25 LAB — CMP (CANCER CENTER ONLY)
ALT: 10 U/L (ref 0–44)
ANION GAP: 9 (ref 5–15)
AST: 16 U/L (ref 15–41)
Albumin: 3.8 g/dL (ref 3.5–5.0)
Alkaline Phosphatase: 98 U/L (ref 38–126)
BUN: 14 mg/dL (ref 8–23)
CO2: 27 mmol/L (ref 22–32)
Calcium: 9.4 mg/dL (ref 8.9–10.3)
Chloride: 105 mmol/L (ref 98–111)
Creatinine: 1.41 mg/dL — ABNORMAL HIGH (ref 0.44–1.00)
GFR, Est AFR Am: 45 mL/min — ABNORMAL LOW (ref >60)
GFR, Estimated: 38 mL/min — ABNORMAL LOW (ref >60)
Glucose, Bld: 94 mg/dL (ref 70–99)
Potassium: 4.5 mmol/L (ref 3.5–5.1)
SODIUM: 141 mmol/L (ref 135–145)
Total Bilirubin: 0.5 mg/dL (ref 0.3–1.2)
Total Protein: 7.5 g/dL (ref 6.5–8.1)

## 2018-03-25 LAB — CBC WITH DIFFERENTIAL (CANCER CENTER ONLY)
Abs Immature Granulocytes: 0.01 10*3/uL (ref 0.00–0.07)
Basophils Absolute: 0 10*3/uL (ref 0.0–0.1)
Basophils Relative: 0 %
Eosinophils Absolute: 0 10*3/uL (ref 0.0–0.5)
Eosinophils Relative: 0 %
HCT: 41 % (ref 36.0–46.0)
Hemoglobin: 13.7 g/dL (ref 12.0–15.0)
IMMATURE GRANULOCYTES: 0 %
LYMPHS PCT: 15 %
Lymphs Abs: 0.7 10*3/uL (ref 0.7–4.0)
MCH: 30.1 pg (ref 26.0–34.0)
MCHC: 33.4 g/dL (ref 30.0–36.0)
MCV: 90.1 fL (ref 80.0–100.0)
Monocytes Absolute: 0.2 10*3/uL (ref 0.1–1.0)
Monocytes Relative: 4 %
NEUTROS ABS: 3.9 10*3/uL (ref 1.7–7.7)
Neutrophils Relative %: 81 %
Platelet Count: 120 10*3/uL — ABNORMAL LOW (ref 150–400)
RBC: 4.55 MIL/uL (ref 3.87–5.11)
RDW: 13.8 % (ref 11.5–15.5)
WBC Count: 4.8 10*3/uL (ref 4.0–10.5)
nRBC: 0 % (ref 0.0–0.2)

## 2018-03-25 MED ORDER — PROCHLORPERAZINE MALEATE 10 MG PO TABS: 10.0000 mg | ORAL_TABLET | Freq: Once | ORAL | Status: DC

## 2018-03-25 MED ORDER — BORTEZOMIB CHEMO SQ INJECTION 3.5 MG (2.5MG/ML)
1.3000 mg/m2 | Freq: Once | INTRAMUSCULAR | Status: AC
  Administered 2018-03-25: 2.75 mg via SUBCUTANEOUS
  Filled 2018-03-25: qty 1.1

## 2018-03-25 MED FILL — ACYCLOVIR 200 MG CAP: 200 | 30 days supply | Qty: 60 | Fill #0

## 2018-03-25 NOTE — Progress Notes (Signed)
Pt c/o eye itching and watering. This RN spoke with Sandi Mealy PA who had spoken to pt about these symptoms on her last visit. This RN was asked to remind the pt of instructions Sandi Mealy gave pt at last visit, which were to buy and use Visine Allergy drops and f/u with PCP. This RN reminded pt of instructions and pt verbalized understanding.

## 2018-03-25 NOTE — Progress Notes (Signed)
03/25/18  Confirmed with RN patient still with mild conjunctivitis and still using Tobradex eye drops.  Symptoms are improved per patient.  Will continue to monitor.  V.O. Loren RN/Emalene Welte Ronnald Ramp, PharmD

## 2018-03-25 NOTE — Patient Instructions (Signed)
[facility] Discharge Instructions for Patients Receiving Chemotherapy  Today you received the following chemotherapy agents Velcade.  To help prevent nausea and vomiting after your treatment, we encourage you to take your nausea medication as directed.  If you develop nausea and vomiting that is not controlled by your nausea medication, call the clinic.   BELOW ARE SYMPTOMS THAT SHOULD BE REPORTED IMMEDIATELY:  *FEVER GREATER THAN 100.5 F  *CHILLS WITH OR WITHOUT FEVER  NAUSEA AND VOMITING THAT IS NOT CONTROLLED WITH YOUR NAUSEA MEDICATION  *UNUSUAL SHORTNESS OF BREATH  *UNUSUAL BRUISING OR BLEEDING  TENDERNESS IN MOUTH AND THROAT WITH OR WITHOUT PRESENCE OF ULCERS  *URINARY PROBLEMS  *BOWEL PROBLEMS  UNUSUAL RASH Items with * indicate a potential emergency and should be followed up as soon as possible.  Feel free to call the clinic should you have any questions or concerns. The clinic phone number is [phone].  Please show the CHEMO ALERT CARD at check-in to the Emergency Department and triage nurse.   

## 2018-03-30 ENCOUNTER — Other Ambulatory Visit: Payer: Self-pay | Admitting: *Deleted

## 2018-03-30 DIAGNOSIS — C9000 Multiple myeloma not having achieved remission: Secondary | ICD-10-CM

## 2018-04-01 ENCOUNTER — Inpatient Hospital Stay: Payer: Self-pay

## 2018-04-01 ENCOUNTER — Inpatient Hospital Stay: Payer: Self-pay | Attending: Internal Medicine

## 2018-04-01 VITALS — BP 125/69 | HR 78 | Temp 98.4°F | Resp 18

## 2018-04-01 DIAGNOSIS — Z5112 Encounter for antineoplastic immunotherapy: Secondary | ICD-10-CM | POA: Insufficient documentation

## 2018-04-01 DIAGNOSIS — G629 Polyneuropathy, unspecified: Secondary | ICD-10-CM | POA: Insufficient documentation

## 2018-04-01 DIAGNOSIS — C9000 Multiple myeloma not having achieved remission: Secondary | ICD-10-CM | POA: Insufficient documentation

## 2018-04-01 DIAGNOSIS — Z79899 Other long term (current) drug therapy: Secondary | ICD-10-CM | POA: Insufficient documentation

## 2018-04-01 LAB — CBC WITH DIFFERENTIAL (CANCER CENTER ONLY)
Abs Immature Granulocytes: 0.01 10*3/uL (ref 0.00–0.07)
BASOS PCT: 0 %
Basophils Absolute: 0 10*3/uL (ref 0.0–0.1)
Eosinophils Absolute: 0.1 10*3/uL (ref 0.0–0.5)
Eosinophils Relative: 1 %
HCT: 37.8 % (ref 36.0–46.0)
Hemoglobin: 12.9 g/dL (ref 12.0–15.0)
Immature Granulocytes: 0 %
Lymphocytes Relative: 24 %
Lymphs Abs: 1.1 10*3/uL (ref 0.7–4.0)
MCH: 30.4 pg (ref 26.0–34.0)
MCHC: 34.1 g/dL (ref 30.0–36.0)
MCV: 89.2 fL (ref 80.0–100.0)
Monocytes Absolute: 0.3 10*3/uL (ref 0.1–1.0)
Monocytes Relative: 6 %
NEUTROS ABS: 3.1 10*3/uL (ref 1.7–7.7)
Neutrophils Relative %: 69 %
Platelet Count: 104 10*3/uL — ABNORMAL LOW (ref 150–400)
RBC: 4.24 MIL/uL (ref 3.87–5.11)
RDW: 13.7 % (ref 11.5–15.5)
WBC Count: 4.4 10*3/uL (ref 4.0–10.5)
nRBC: 0 % (ref 0.0–0.2)

## 2018-04-01 LAB — CMP (CANCER CENTER ONLY)
ALK PHOS: 86 U/L (ref 38–126)
ALT: 12 U/L (ref 0–44)
AST: 18 U/L (ref 15–41)
Albumin: 3.5 g/dL (ref 3.5–5.0)
Anion gap: 10 (ref 5–15)
BUN: 13 mg/dL (ref 8–23)
CO2: 26 mmol/L (ref 22–32)
Calcium: 9 mg/dL (ref 8.9–10.3)
Chloride: 102 mmol/L (ref 98–111)
Creatinine: 1.21 mg/dL — ABNORMAL HIGH (ref 0.44–1.00)
GFR, Est AFR Am: 54 mL/min — ABNORMAL LOW (ref >60)
GFR, Estimated: 46 mL/min — ABNORMAL LOW (ref >60)
Glucose, Bld: 79 mg/dL (ref 70–99)
Potassium: 4.7 mmol/L (ref 3.5–5.1)
Sodium: 138 mmol/L (ref 135–145)
Total Bilirubin: 0.5 mg/dL (ref 0.3–1.2)
Total Protein: 6.8 g/dL (ref 6.5–8.1)

## 2018-04-01 MED ORDER — PROCHLORPERAZINE MALEATE 10 MG PO TABS: 10.0000 mg | ORAL_TABLET | Freq: Once | ORAL | Status: DC

## 2018-04-01 MED ORDER — BORTEZOMIB CHEMO SQ INJECTION 3.5 MG (2.5MG/ML)
1.3000 mg/m2 | Freq: Once | INTRAMUSCULAR | Status: AC
  Administered 2018-04-01: 2.75 mg via SUBCUTANEOUS
  Filled 2018-04-01: qty 1.1

## 2018-04-01 NOTE — Patient Instructions (Signed)
[facility] Discharge Instructions for Patients Receiving Chemotherapy  Today you received the following chemotherapy agents Velcade.  To help prevent nausea and vomiting after your treatment, we encourage you to take your nausea medication as directed.  If you develop nausea and vomiting that is not controlled by your nausea medication, call the clinic.   BELOW ARE SYMPTOMS THAT SHOULD BE REPORTED IMMEDIATELY:  *FEVER GREATER THAN 100.5 F  *CHILLS WITH OR WITHOUT FEVER  NAUSEA AND VOMITING THAT IS NOT CONTROLLED WITH YOUR NAUSEA MEDICATION  *UNUSUAL SHORTNESS OF BREATH  *UNUSUAL BRUISING OR BLEEDING  TENDERNESS IN MOUTH AND THROAT WITH OR WITHOUT PRESENCE OF ULCERS  *URINARY PROBLEMS  *BOWEL PROBLEMS  UNUSUAL RASH Items with * indicate a potential emergency and should be followed up as soon as possible.  Feel free to call the clinic should you have any questions or concerns. The clinic phone number is [phone].  Please show the CHEMO ALERT CARD at check-in to the Emergency Department and triage nurse.   

## 2018-04-01 NOTE — Progress Notes (Signed)
Pt is using her eye gtts and conjunctivitis is "getting better" per inf RN. Kennith Center, Pharm.D., CPP 04/01/2018@10 :00 AM

## 2018-04-08 ENCOUNTER — Inpatient Hospital Stay: Payer: Self-pay

## 2018-04-08 VITALS — BP 131/76 | HR 80 | Temp 97.6°F | Resp 18

## 2018-04-08 DIAGNOSIS — C9000 Multiple myeloma not having achieved remission: Secondary | ICD-10-CM

## 2018-04-08 LAB — CMP (CANCER CENTER ONLY)
ALK PHOS: 78 U/L (ref 38–126)
ALT: 10 U/L (ref 0–44)
AST: 17 U/L (ref 15–41)
Albumin: 3.5 g/dL (ref 3.5–5.0)
Anion gap: 10 (ref 5–15)
BUN: 13 mg/dL (ref 8–23)
CALCIUM: 9.1 mg/dL (ref 8.9–10.3)
CO2: 26 mmol/L (ref 22–32)
Chloride: 104 mmol/L (ref 98–111)
Creatinine: 1.15 mg/dL — ABNORMAL HIGH (ref 0.44–1.00)
GFR, Est AFR Am: 57 mL/min — ABNORMAL LOW (ref >60)
GFR, Estimated: 49 mL/min — ABNORMAL LOW (ref >60)
Glucose, Bld: 96 mg/dL (ref 70–99)
Potassium: 4.2 mmol/L (ref 3.5–5.1)
Sodium: 140 mmol/L (ref 135–145)
Total Bilirubin: 0.6 mg/dL (ref 0.3–1.2)
Total Protein: 6.9 g/dL (ref 6.5–8.1)

## 2018-04-08 LAB — CBC WITH DIFFERENTIAL (CANCER CENTER ONLY)
Abs Immature Granulocytes: 0 10*3/uL (ref 0.00–0.07)
Basophils Absolute: 0 10*3/uL (ref 0.0–0.1)
Basophils Relative: 0 %
Eosinophils Absolute: 0.1 10*3/uL (ref 0.0–0.5)
Eosinophils Relative: 2 %
HCT: 41.1 % (ref 36.0–46.0)
Hemoglobin: 13.7 g/dL (ref 12.0–15.0)
Immature Granulocytes: 0 %
Lymphocytes Relative: 33 %
Lymphs Abs: 1.1 10*3/uL (ref 0.7–4.0)
MCH: 30.1 pg (ref 26.0–34.0)
MCHC: 33.3 g/dL (ref 30.0–36.0)
MCV: 90.3 fL (ref 80.0–100.0)
Monocytes Absolute: 0.5 10*3/uL (ref 0.1–1.0)
Monocytes Relative: 16 %
NEUTROS ABS: 1.7 10*3/uL (ref 1.7–7.7)
Neutrophils Relative %: 49 %
Platelet Count: 88 10*3/uL — ABNORMAL LOW (ref 150–400)
RBC: 4.55 MIL/uL (ref 3.87–5.11)
RDW: 14 % (ref 11.5–15.5)
WBC Count: 3.3 10*3/uL — ABNORMAL LOW (ref 4.0–10.5)
nRBC: 0 % (ref 0.0–0.2)

## 2018-04-08 MED ORDER — BORTEZOMIB CHEMO SQ INJECTION 3.5 MG (2.5MG/ML)
1.3000 mg/m2 | Freq: Once | INTRAMUSCULAR | Status: AC
  Administered 2018-04-08: 2.75 mg via SUBCUTANEOUS
  Filled 2018-04-08: qty 1.1

## 2018-04-08 MED ORDER — PROCHLORPERAZINE MALEATE 10 MG PO TABS: 10.0000 mg | ORAL_TABLET | Freq: Once | ORAL | Status: DC

## 2018-04-08 NOTE — Patient Instructions (Signed)
[facility] Discharge Instructions for Patients Receiving Chemotherapy  Today you received the following chemotherapy agents Velcade.  To help prevent nausea and vomiting after your treatment, we encourage you to take your nausea medication as directed.  If you develop nausea and vomiting that is not controlled by your nausea medication, call the clinic.   BELOW ARE SYMPTOMS THAT SHOULD BE REPORTED IMMEDIATELY:  *FEVER GREATER THAN 100.5 F  *CHILLS WITH OR WITHOUT FEVER  NAUSEA AND VOMITING THAT IS NOT CONTROLLED WITH YOUR NAUSEA MEDICATION  *UNUSUAL SHORTNESS OF BREATH  *UNUSUAL BRUISING OR BLEEDING  TENDERNESS IN MOUTH AND THROAT WITH OR WITHOUT PRESENCE OF ULCERS  *URINARY PROBLEMS  *BOWEL PROBLEMS  UNUSUAL RASH Items with * indicate a potential emergency and should be followed up as soon as possible.  Feel free to call the clinic should you have any questions or concerns. The clinic phone number is [phone].  Please show the CHEMO ALERT CARD at check-in to the Emergency Department and triage nurse.   

## 2018-04-08 NOTE — Progress Notes (Signed)
OK to treat with CBC results today per MD [facility]

## 2018-04-15 ENCOUNTER — Inpatient Hospital Stay: Payer: Self-pay

## 2018-04-15 ENCOUNTER — Inpatient Hospital Stay (HOSPITAL_BASED_OUTPATIENT_CLINIC_OR_DEPARTMENT_OTHER): Payer: Self-pay | Admitting: Internal Medicine

## 2018-04-15 ENCOUNTER — Telehealth: Payer: Self-pay | Admitting: Internal Medicine

## 2018-04-15 ENCOUNTER — Encounter: Payer: Self-pay | Admitting: Internal Medicine

## 2018-04-15 ENCOUNTER — Ambulatory Visit: Payer: Self-pay | Admitting: Internal Medicine

## 2018-04-15 VITALS — BP 133/78 | HR 83 | Temp 98.2°F | Resp 18 | Ht 68.0 in | Wt 191.4 lb

## 2018-04-15 DIAGNOSIS — C9000 Multiple myeloma not having achieved remission: Secondary | ICD-10-CM

## 2018-04-15 DIAGNOSIS — Z79899 Other long term (current) drug therapy: Secondary | ICD-10-CM

## 2018-04-15 DIAGNOSIS — G629 Polyneuropathy, unspecified: Secondary | ICD-10-CM

## 2018-04-15 DIAGNOSIS — Z5111 Encounter for antineoplastic chemotherapy: Secondary | ICD-10-CM

## 2018-04-15 LAB — CBC WITH DIFFERENTIAL (CANCER CENTER ONLY)
Abs Immature Granulocytes: 0.01 10*3/uL (ref 0.00–0.07)
BASOS PCT: 0 %
Basophils Absolute: 0 10*3/uL (ref 0.0–0.1)
Eosinophils Absolute: 0 10*3/uL (ref 0.0–0.5)
Eosinophils Relative: 1 %
HCT: 38.4 % (ref 36.0–46.0)
Hemoglobin: 12.9 g/dL (ref 12.0–15.0)
Immature Granulocytes: 0 %
LYMPHS PCT: 24 %
Lymphs Abs: 0.8 10*3/uL (ref 0.7–4.0)
MCH: 30.4 pg (ref 26.0–34.0)
MCHC: 33.6 g/dL (ref 30.0–36.0)
MCV: 90.6 fL (ref 80.0–100.0)
Monocytes Absolute: 0.3 10*3/uL (ref 0.1–1.0)
Monocytes Relative: 8 %
Neutro Abs: 2.1 10*3/uL (ref 1.7–7.7)
Neutrophils Relative %: 67 %
Platelet Count: 94 10*3/uL — ABNORMAL LOW (ref 150–400)
RBC: 4.24 MIL/uL (ref 3.87–5.11)
RDW: 13.9 % (ref 11.5–15.5)
WBC: 3.2 10*3/uL — AB (ref 4.0–10.5)
nRBC: 0 % (ref 0.0–0.2)

## 2018-04-15 LAB — CMP (CANCER CENTER ONLY)
ALBUMIN: 3.6 g/dL (ref 3.5–5.0)
ALT: 10 U/L (ref 0–44)
AST: 18 U/L (ref 15–41)
Alkaline Phosphatase: 86 U/L (ref 38–126)
Anion gap: 9 (ref 5–15)
BUN: 16 mg/dL (ref 8–23)
CO2: 27 mmol/L (ref 22–32)
Calcium: 9.2 mg/dL (ref 8.9–10.3)
Chloride: 105 mmol/L (ref 98–111)
Creatinine: 1.26 mg/dL — ABNORMAL HIGH (ref 0.44–1.00)
GFR, Est AFR Am: 51 mL/min — ABNORMAL LOW (ref >60)
GFR, Estimated: 44 mL/min — ABNORMAL LOW (ref >60)
Glucose, Bld: 91 mg/dL (ref 70–99)
Potassium: 4.2 mmol/L (ref 3.5–5.1)
Sodium: 141 mmol/L (ref 135–145)
Total Bilirubin: 0.5 mg/dL (ref 0.3–1.2)
Total Protein: 6.9 g/dL (ref 6.5–8.1)

## 2018-04-15 MED ORDER — PROCHLORPERAZINE MALEATE 10 MG PO TABS: 10.0000 mg | ORAL_TABLET | Freq: Once | ORAL | Status: DC

## 2018-04-15 MED ORDER — BORTEZOMIB CHEMO SQ INJECTION 3.5 MG (2.5MG/ML)
1.3000 mg/m2 | Freq: Once | INTRAMUSCULAR | Status: AC
  Administered 2018-04-15: 2.75 mg via SUBCUTANEOUS
  Filled 2018-04-15: qty 1.1

## 2018-04-15 MED FILL — ACYCLOVIR 200 MG CAP: 200 | 30 days supply | Qty: 60 | Fill #0

## 2018-04-15 MED FILL — OMEPRAZOLE 40 MG CPDR: 40 | 30 days supply | Qty: 30 | Fill #2

## 2018-04-15 MED FILL — DEXAMETHASONE 4 MG TABLET: 4 | 28 days supply | Qty: 40 | Fill #2

## 2018-04-15 NOTE — Patient Instructions (Signed)
[facility] Discharge Instructions for Patients Receiving Chemotherapy  Today you received the following chemotherapy agents Velcade.  To help prevent nausea and vomiting after your treatment, we encourage you to take your nausea medication as directed.  If you develop nausea and vomiting that is not controlled by your nausea medication, call the clinic.   BELOW ARE SYMPTOMS THAT SHOULD BE REPORTED IMMEDIATELY:  *FEVER GREATER THAN 100.5 F  *CHILLS WITH OR WITHOUT FEVER  NAUSEA AND VOMITING THAT IS NOT CONTROLLED WITH YOUR NAUSEA MEDICATION  *UNUSUAL SHORTNESS OF BREATH  *UNUSUAL BRUISING OR BLEEDING  TENDERNESS IN MOUTH AND THROAT WITH OR WITHOUT PRESENCE OF ULCERS  *URINARY PROBLEMS  *BOWEL PROBLEMS  UNUSUAL RASH Items with * indicate a potential emergency and should be followed up as soon as possible.  Feel free to call the clinic should you have any questions or concerns. The clinic phone number is [phone].  Please show the CHEMO ALERT CARD at check-in to the Emergency Department and triage nurse.   

## 2018-04-15 NOTE — Telephone Encounter (Signed)
Gave avs and calendar ° °

## 2018-04-15 NOTE — Progress Notes (Signed)
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  PRIOR THERAPY: None  CURRENT THERAPY: Systemic therapy with Velcade 1.3 mg/M2 weekly, Revlimid 25 mg p.o. daily for 14 days every 3 weeks in addition to weekly Decadron 40 mg orally. First dose of treatment 10/08/2017.Treatment was placed on hold due to development of a significant rash.Sheresumedtreatment with dexamethasone and Velcade on 11/19/2017. She will not resume treatment of Revlimid due to the rash.  Status post 4 months of treatment.  INTERVAL HISTORY: Debbie Chan 68 y.o. female returns to the clinic today for follow-up visit accompanied by her daughter-in-law.  The patient is feeling fine today with no concerning complaints except for some numbness and tingling's in her toes and aching pain in the lower extremities.  She denied having any chest pain, shortness of breath, cough or hemoptysis.  She denied having any fever or chills.  She has no nausea, vomiting, diarrhea or constipation.  She denied having any headache or visual changes.  She has been tolerating her treatment with Velcade and Decadron fairly well.   MEDICAL HISTORY:No past medical history on file.  ALLERGIES:  has No Known Allergies.  MEDICATIONS:  Current Outpatient Medications  Medication Sig Dispense Refill  . acyclovir (ZOVIRAX) 200 MG capsule TAKE 1 CAPSULE BY MOUTH 2 TIMES DAILY. 60 capsule 0  . blood glucose meter kit and supplies Dispense based on patient and insurance preference. Use up to four times daily as directed. (FOR ICD-10 E10.9, E11.9). (Patient not taking: Reported on 11/26/2017) 1 each 0  . dexamethasone (DECADRON) 4 MG tablet Take 10 tablets (40 mg total) by mouth every 7 (seven) days. 40 tablet 3  . diphenhydrAMINE (BENADRYL ALLERGY) 25 MG tablet Take 1 tablet  (25 mg total) by mouth every 6 (six) hours as needed for itching. (Patient not taking: Reported on 11/26/2017) 100 tablet 0  . furosemide (LASIX) 20 MG tablet Take once daily for 3 days for lower extremity edema, then stop. Repeat as needed for recurrent edema. (Patient not taking: Reported on 12/24/2017) 30 tablet 2  . metoprolol tartrate (LOPRESSOR) 25 MG tablet Take 0.5 tablets (12.5 mg total) by mouth 2 (two) times daily. 30 tablet 0  . omeprazole (PRILOSEC) 40 MG capsule Take 1 capsule (40 mg total) by mouth daily. 30 capsule 0  . polyethylene glycol (MIRALAX / GLYCOLAX) packet Take 17 g by mouth daily. (Patient not taking: Reported on 11/26/2017) 14 each 0  . prochlorperazine (COMPAZINE) 10 MG tablet Take 1 tablet (10 mg total) by mouth every 6 (six) hours as needed for nausea or vomiting. (Patient not taking: Reported on 12/24/2017) 30 tablet 0  . traZODone (DESYREL) 50 MG tablet Take 1 tablet (50 mg total) by mouth at bedtime. (Patient not taking: Reported on 12/24/2017) 30 tablet 0   No current facility-administered medications for this visit.     SURGICAL HISTORY: No past surgical history on file.  REVIEW OF SYSTEMS:  A comprehensive review of systems was negative except for: Constitutional: positive for fatigue Musculoskeletal: positive for arthralgias Neurological: positive for paresthesia   PHYSICAL EXAMINATION: General appearance: alert, cooperative, fatigued and no distress Head: Normocephalic, without obvious abnormality, atraumatic Neck: no adenopathy, no JVD, supple, symmetrical, trachea midline and thyroid not enlarged, symmetric, no tenderness/mass/nodules Lymph nodes: Cervical, supraclavicular, and axillary nodes normal. Resp: clear to auscultation bilaterally  Back: symmetric, no curvature. ROM normal. No CVA tenderness. Cardio: regular rate and rhythm, S1, S2 normal, no murmur, click, rub or gallop GI: soft, non-tender; bowel sounds normal; no masses,  no  organomegaly Extremities: extremities normal, atraumatic, no cyanosis or edema  ECOG PERFORMANCE STATUS: 1 - Symptomatic but completely ambulatory  Blood pressure 133/78, pulse 83, temperature 98.2 F (36.8 C), temperature source Oral, resp. rate 18, height '5\' 8"'  (1.727 m), weight 191 lb 6.4 oz (86.8 kg), SpO2 100 %.  LABORATORY DATA: Lab Results  Component Value Date   WBC 3.2 (L) 04/15/2018   HGB 12.9 04/15/2018   HCT 38.4 04/15/2018   MCV 90.6 04/15/2018   PLT 94 (L) 04/15/2018      Chemistry      Component Value Date/Time   NA 140 04/08/2018 0808   K 4.2 04/08/2018 0808   CL 104 04/08/2018 0808   CO2 26 04/08/2018 0808   BUN 13 04/08/2018 0808   CREATININE 1.15 (H) 04/08/2018 0808      Component Value Date/Time   CALCIUM 9.1 04/08/2018 0808   ALKPHOS 78 04/08/2018 0808   AST 17 04/08/2018 0808   ALT 10 04/08/2018 0808   BILITOT 0.6 04/08/2018 0808       RADIOGRAPHIC STUDIES: No results found.  ASSESSMENT AND PLAN: This is a very pleasant 68 years old African female originally from [place] who is visiting her son in [place] and was recently diagnosed with IgA multiple myeloma. The patient was a started initially on treatment with weekly subcutaneous Velcade 1.3 mg/M2, Revlimid 25 mg p.o. daily for 21 days every 4 weeks as well as weekly Decadron 40 mg orally.  She was tolerating the treatment well but she developed significant skin rash secondary to treatment with Revlimid and this was discontinued. The patient continued treatment with subcutaneous Velcade and Decadron. The patient continues to tolerate this treatment well except for mild peripheral neuropathy. I recommended for her to proceed with cycle #6 today as scheduled. She will come back for follow-up visit in 1 months for evaluation after repeating myeloma panel. She was advised to call immediately if she has any concerning symptoms in the interval. The patient voices understanding of  current disease status and treatment options and is in agreement with the current care plan.  All questions were answered. The patient knows to call the clinic with any problems, questions or concerns. We can certainly see the patient much sooner if necessary.  Disclaimer: This note was dictated with voice recognition software. Similar sounding words can inadvertently be transcribed and may not be corrected upon review.

## 2018-04-15 NOTE — Progress Notes (Signed)
OK to [place] per Dr. Julien Nordmann w/ plt 94

## 2018-04-22 ENCOUNTER — Inpatient Hospital Stay: Payer: Self-pay

## 2018-04-22 VITALS — BP 126/61 | HR 74 | Resp 18 | Wt 191.5 lb

## 2018-04-22 DIAGNOSIS — C9000 Multiple myeloma not having achieved remission: Secondary | ICD-10-CM

## 2018-04-22 LAB — CMP (CANCER CENTER ONLY)
ALT: 10 U/L (ref 0–44)
AST: 18 U/L (ref 15–41)
Albumin: 3.5 g/dL (ref 3.5–5.0)
Alkaline Phosphatase: 78 U/L (ref 38–126)
Anion gap: 10 (ref 5–15)
BUN: 17 mg/dL (ref 8–23)
CALCIUM: 8.8 mg/dL — AB (ref 8.9–10.3)
CO2: 26 mmol/L (ref 22–32)
Chloride: 103 mmol/L (ref 98–111)
Creatinine: 1.23 mg/dL — ABNORMAL HIGH (ref 0.44–1.00)
GFR, EST AFRICAN AMERICAN: 53 mL/min — AB (ref >60)
GFR, EST NON AFRICAN AMERICAN: 45 mL/min — AB (ref >60)
Glucose, Bld: 104 mg/dL — ABNORMAL HIGH (ref 70–99)
Potassium: 4.6 mmol/L (ref 3.5–5.1)
Sodium: 139 mmol/L (ref 135–145)
Total Bilirubin: 0.5 mg/dL (ref 0.3–1.2)
Total Protein: 6.8 g/dL (ref 6.5–8.1)

## 2018-04-22 LAB — CBC WITH DIFFERENTIAL (CANCER CENTER ONLY)
Abs Immature Granulocytes: 0.01 10*3/uL (ref 0.00–0.07)
Basophils Absolute: 0 10*3/uL (ref 0.0–0.1)
Basophils Relative: 0 %
EOS ABS: 0.1 10*3/uL (ref 0.0–0.5)
EOS PCT: 2 %
HCT: 38.3 % (ref 36.0–46.0)
Hemoglobin: 12.8 g/dL (ref 12.0–15.0)
Immature Granulocytes: 0 %
Lymphocytes Relative: 24 %
Lymphs Abs: 0.8 10*3/uL (ref 0.7–4.0)
MCH: 30 pg (ref 26.0–34.0)
MCHC: 33.4 g/dL (ref 30.0–36.0)
MCV: 89.7 fL (ref 80.0–100.0)
Monocytes Absolute: 0.4 10*3/uL (ref 0.1–1.0)
Monocytes Relative: 11 %
Neutro Abs: 2.1 10*3/uL (ref 1.7–7.7)
Neutrophils Relative %: 63 %
Platelet Count: 109 10*3/uL — ABNORMAL LOW (ref 150–400)
RBC: 4.27 MIL/uL (ref 3.87–5.11)
RDW: 13.8 % (ref 11.5–15.5)
WBC Count: 3.4 10*3/uL — ABNORMAL LOW (ref 4.0–10.5)
nRBC: 0 % (ref 0.0–0.2)

## 2018-04-22 MED ORDER — BORTEZOMIB CHEMO SQ INJECTION 3.5 MG (2.5MG/ML)
1.3000 mg/m2 | Freq: Once | INTRAMUSCULAR | Status: AC
  Administered 2018-04-22: 2.75 mg via SUBCUTANEOUS
  Filled 2018-04-22: qty 1.1

## 2018-04-22 MED ORDER — PROCHLORPERAZINE MALEATE 10 MG PO TABS: 10.0000 mg | ORAL_TABLET | Freq: Once | ORAL | Status: DC

## 2018-04-22 NOTE — Patient Instructions (Signed)
[facility] Discharge Instructions for Patients Receiving Chemotherapy  Today you received the following chemotherapy agents Velcade.  To help prevent nausea and vomiting after your treatment, we encourage you to take your nausea medication as directed.  If you develop nausea and vomiting that is not controlled by your nausea medication, call the clinic.   BELOW ARE SYMPTOMS THAT SHOULD BE REPORTED IMMEDIATELY:  *FEVER GREATER THAN 100.5 F  *CHILLS WITH OR WITHOUT FEVER  NAUSEA AND VOMITING THAT IS NOT CONTROLLED WITH YOUR NAUSEA MEDICATION  *UNUSUAL SHORTNESS OF BREATH  *UNUSUAL BRUISING OR BLEEDING  TENDERNESS IN MOUTH AND THROAT WITH OR WITHOUT PRESENCE OF ULCERS  *URINARY PROBLEMS  *BOWEL PROBLEMS  UNUSUAL RASH Items with * indicate a potential emergency and should be followed up as soon as possible.  Feel free to call the clinic should you have any questions or concerns. The clinic phone number is [phone].  Please show the CHEMO ALERT CARD at check-in to the Emergency Department and triage nurse.   

## 2018-04-29 ENCOUNTER — Inpatient Hospital Stay: Payer: Self-pay

## 2018-04-29 ENCOUNTER — Inpatient Hospital Stay: Payer: Self-pay | Attending: Internal Medicine

## 2018-04-29 VITALS — BP 135/75 | HR 73 | Temp 98.0°F | Resp 18

## 2018-04-29 DIAGNOSIS — Z5111 Encounter for antineoplastic chemotherapy: Secondary | ICD-10-CM | POA: Insufficient documentation

## 2018-04-29 DIAGNOSIS — C9000 Multiple myeloma not having achieved remission: Secondary | ICD-10-CM | POA: Insufficient documentation

## 2018-04-29 DIAGNOSIS — M79674 Pain in right toe(s): Secondary | ICD-10-CM | POA: Insufficient documentation

## 2018-04-29 DIAGNOSIS — R05 Cough: Secondary | ICD-10-CM | POA: Insufficient documentation

## 2018-04-29 DIAGNOSIS — Z79899 Other long term (current) drug therapy: Secondary | ICD-10-CM | POA: Insufficient documentation

## 2018-04-29 DIAGNOSIS — M79675 Pain in left toe(s): Secondary | ICD-10-CM | POA: Insufficient documentation

## 2018-04-29 LAB — CBC WITH DIFFERENTIAL (CANCER CENTER ONLY)
Abs Immature Granulocytes: 0.01 10*3/uL (ref 0.00–0.07)
BASOS ABS: 0 10*3/uL (ref 0.0–0.1)
Basophils Relative: 0 %
EOS PCT: 1 %
Eosinophils Absolute: 0.1 10*3/uL (ref 0.0–0.5)
HCT: 39 % (ref 36.0–46.0)
Hemoglobin: 13.1 g/dL (ref 12.0–15.0)
Immature Granulocytes: 0 %
Lymphocytes Relative: 24 %
Lymphs Abs: 0.8 10*3/uL (ref 0.7–4.0)
MCH: 30.2 pg (ref 26.0–34.0)
MCHC: 33.6 g/dL (ref 30.0–36.0)
MCV: 89.9 fL (ref 80.0–100.0)
Monocytes Absolute: 0.3 10*3/uL (ref 0.1–1.0)
Monocytes Relative: 8 %
Neutro Abs: 2.3 10*3/uL (ref 1.7–7.7)
Neutrophils Relative %: 67 %
Platelet Count: 109 10*3/uL — ABNORMAL LOW (ref 150–400)
RBC: 4.34 MIL/uL (ref 3.87–5.11)
RDW: 13.6 % (ref 11.5–15.5)
WBC Count: 3.5 10*3/uL — ABNORMAL LOW (ref 4.0–10.5)
nRBC: 0 % (ref 0.0–0.2)

## 2018-04-29 LAB — COMPREHENSIVE METABOLIC PANEL
ALT: 14 U/L (ref 0–44)
AST: 23 U/L (ref 15–41)
Albumin: 3.9 g/dL (ref 3.5–5.0)
Alkaline Phosphatase: 71 U/L (ref 38–126)
Anion gap: 5 (ref 5–15)
BUN: 18 mg/dL (ref 8–23)
CO2: 29 mmol/L (ref 22–32)
Calcium: 9.2 mg/dL (ref 8.9–10.3)
Chloride: 105 mmol/L (ref 98–111)
Creatinine, Ser: 1.16 mg/dL — ABNORMAL HIGH (ref 0.44–1.00)
GFR, EST AFRICAN AMERICAN: 56 mL/min — AB (ref >60)
GFR, EST NON AFRICAN AMERICAN: 49 mL/min — AB (ref >60)
Glucose, Bld: 89 mg/dL (ref 70–99)
Potassium: 3.9 mmol/L (ref 3.5–5.1)
Sodium: 139 mmol/L (ref 135–145)
TOTAL PROTEIN: 6.6 g/dL (ref 6.5–8.1)
Total Bilirubin: 0.7 mg/dL (ref 0.3–1.2)

## 2018-04-29 MED ORDER — PROCHLORPERAZINE MALEATE 10 MG PO TABS: 10.0000 mg | ORAL_TABLET | Freq: Once | ORAL | Status: DC

## 2018-04-29 MED ORDER — BORTEZOMIB CHEMO SQ INJECTION 3.5 MG (2.5MG/ML)
1.3000 mg/m2 | Freq: Once | INTRAMUSCULAR | Status: AC
  Administered 2018-04-29: 2.75 mg via SUBCUTANEOUS
  Filled 2018-04-29: qty 1.1

## 2018-04-29 NOTE — Patient Instructions (Signed)
[facility] Discharge Instructions for Patients Receiving Chemotherapy  Today you received the following chemotherapy agents Velcade.  To help prevent nausea and vomiting after your treatment, we encourage you to take your nausea medication as directed.  If you develop nausea and vomiting that is not controlled by your nausea medication, call the clinic.   BELOW ARE SYMPTOMS THAT SHOULD BE REPORTED IMMEDIATELY:  *FEVER GREATER THAN 100.5 F  *CHILLS WITH OR WITHOUT FEVER  NAUSEA AND VOMITING THAT IS NOT CONTROLLED WITH YOUR NAUSEA MEDICATION  *UNUSUAL SHORTNESS OF BREATH  *UNUSUAL BRUISING OR BLEEDING  TENDERNESS IN MOUTH AND THROAT WITH OR WITHOUT PRESENCE OF ULCERS  *URINARY PROBLEMS  *BOWEL PROBLEMS  UNUSUAL RASH Items with * indicate a potential emergency and should be followed up as soon as possible.  Feel free to call the clinic should you have any questions or concerns. The clinic phone number is [phone].  Please show the CHEMO ALERT CARD at check-in to the Emergency Department and triage nurse.   

## 2018-05-06 ENCOUNTER — Inpatient Hospital Stay: Payer: Self-pay

## 2018-05-06 ENCOUNTER — Other Ambulatory Visit: Payer: Self-pay

## 2018-05-06 ENCOUNTER — Other Ambulatory Visit: Payer: Self-pay | Admitting: Medical

## 2018-05-06 VITALS — BP 144/76 | HR 73 | Temp 98.4°F | Resp 18

## 2018-05-06 DIAGNOSIS — C9000 Multiple myeloma not having achieved remission: Secondary | ICD-10-CM

## 2018-05-06 LAB — CBC WITH DIFFERENTIAL/PLATELET
Abs Immature Granulocytes: 0.01 10*3/uL (ref 0.00–0.07)
Basophils Absolute: 0 10*3/uL (ref 0.0–0.1)
Basophils Relative: 0 %
EOS PCT: 1 %
Eosinophils Absolute: 0 10*3/uL (ref 0.0–0.5)
HCT: 40.5 % (ref 36.0–46.0)
Hemoglobin: 13.8 g/dL (ref 12.0–15.0)
Immature Granulocytes: 0 %
Lymphocytes Relative: 15 %
Lymphs Abs: 0.7 10*3/uL (ref 0.7–4.0)
MCH: 30.5 pg (ref 26.0–34.0)
MCHC: 34.1 g/dL (ref 30.0–36.0)
MCV: 89.6 fL (ref 80.0–100.0)
MONO ABS: 0.2 10*3/uL (ref 0.1–1.0)
Monocytes Relative: 5 %
Neutro Abs: 3.5 10*3/uL (ref 1.7–7.7)
Neutrophils Relative %: 79 %
Platelets: 105 10*3/uL — ABNORMAL LOW (ref 150–400)
RBC: 4.52 MIL/uL (ref 3.87–5.11)
RDW: 13.9 % (ref 11.5–15.5)
WBC: 4.4 10*3/uL (ref 4.0–10.5)
nRBC: 0 % (ref 0.0–0.2)

## 2018-05-06 LAB — CMP (CANCER CENTER ONLY)
ALT: 9 U/L (ref 0–44)
ANION GAP: 12 (ref 5–15)
AST: 19 U/L (ref 15–41)
Albumin: 3.8 g/dL (ref 3.5–5.0)
Alkaline Phosphatase: 87 U/L (ref 38–126)
BUN: 15 mg/dL (ref 8–23)
CO2: 23 mmol/L (ref 22–32)
Calcium: 9.1 mg/dL (ref 8.9–10.3)
Chloride: 104 mmol/L (ref 98–111)
Creatinine: 1.17 mg/dL — ABNORMAL HIGH (ref 0.44–1.00)
GFR, Est AFR Am: 56 mL/min — ABNORMAL LOW (ref >60)
GFR, Estimated: 48 mL/min — ABNORMAL LOW (ref >60)
Glucose, Bld: 76 mg/dL (ref 70–99)
Potassium: 4.1 mmol/L (ref 3.5–5.1)
Sodium: 139 mmol/L (ref 135–145)
Total Bilirubin: 0.5 mg/dL (ref 0.3–1.2)
Total Protein: 7.3 g/dL (ref 6.5–8.1)

## 2018-05-06 LAB — LACTATE DEHYDROGENASE: LDH: 212 U/L — ABNORMAL HIGH (ref 98–192)

## 2018-05-06 MED ORDER — PROCHLORPERAZINE MALEATE 10 MG PO TABS: 10.0000 mg | ORAL_TABLET | Freq: Once | ORAL | Status: DC

## 2018-05-06 MED ORDER — BORTEZOMIB CHEMO SQ INJECTION 3.5 MG (2.5MG/ML)
1.3000 mg/m2 | Freq: Once | INTRAMUSCULAR | Status: AC
  Administered 2018-05-06: 2.75 mg via SUBCUTANEOUS
  Filled 2018-05-06: qty 1.1

## 2018-05-06 MED ORDER — PROCHLORPERAZINE MALEATE 10 MG PO TABS
ORAL_TABLET | ORAL | Status: AC
  Filled 2018-05-06: qty 1

## 2018-05-06 NOTE — Patient Instructions (Signed)
[facility] Discharge Instructions for Patients Receiving Chemotherapy  Today you received the following chemotherapy agents Velcade.  To help prevent nausea and vomiting after your treatment, we encourage you to take your nausea medication as directed.  If you develop nausea and vomiting that is not controlled by your nausea medication, call the clinic.   BELOW ARE SYMPTOMS THAT SHOULD BE REPORTED IMMEDIATELY:  *FEVER GREATER THAN 100.5 F  *CHILLS WITH OR WITHOUT FEVER  NAUSEA AND VOMITING THAT IS NOT CONTROLLED WITH YOUR NAUSEA MEDICATION  *UNUSUAL SHORTNESS OF BREATH  *UNUSUAL BRUISING OR BLEEDING  TENDERNESS IN MOUTH AND THROAT WITH OR WITHOUT PRESENCE OF ULCERS  *URINARY PROBLEMS  *BOWEL PROBLEMS  UNUSUAL RASH Items with * indicate a potential emergency and should be followed up as soon as possible.  Feel free to call the clinic should you have any questions or concerns. The clinic phone number is [phone].  Please show the CHEMO ALERT CARD at check-in to the Emergency Department and triage nurse.   

## 2018-05-07 LAB — IGG, IGA, IGM
IgA: 1737 mg/dL — ABNORMAL HIGH (ref 87–352)
IgG (Immunoglobin G), Serum: 439 mg/dL — ABNORMAL LOW (ref 700–1600)
IgM (Immunoglobulin M), Srm: 10 mg/dL — ABNORMAL LOW (ref 26–217)

## 2018-05-07 LAB — KAPPA/LAMBDA LIGHT CHAINS
Kappa free light chain: 8.3 mg/L (ref 3.3–19.4)
Kappa, lambda light chain ratio: 0.02 — ABNORMAL LOW (ref 0.26–1.65)
Lambda free light chains: 532.4 mg/L — ABNORMAL HIGH (ref 5.7–26.3)

## 2018-05-07 LAB — BETA 2 MICROGLOBULIN, SERUM: Beta-2 Microglobulin: 3.1 mg/L — ABNORMAL HIGH (ref 0.6–2.4)

## 2018-05-11 ENCOUNTER — Other Ambulatory Visit: Payer: Self-pay | Admitting: Internal Medicine

## 2018-05-13 ENCOUNTER — Inpatient Hospital Stay (HOSPITAL_BASED_OUTPATIENT_CLINIC_OR_DEPARTMENT_OTHER): Payer: Self-pay | Admitting: Physician Assistant

## 2018-05-13 ENCOUNTER — Other Ambulatory Visit: Payer: Self-pay

## 2018-05-13 ENCOUNTER — Inpatient Hospital Stay: Payer: Self-pay

## 2018-05-13 ENCOUNTER — Other Ambulatory Visit: Payer: Self-pay | Admitting: Medical Oncology

## 2018-05-13 ENCOUNTER — Encounter: Payer: Self-pay | Admitting: Physician Assistant

## 2018-05-13 ENCOUNTER — Telehealth: Payer: Self-pay | Admitting: Physician Assistant

## 2018-05-13 ENCOUNTER — Other Ambulatory Visit: Payer: Self-pay | Admitting: Medical

## 2018-05-13 VITALS — BP 117/63 | HR 87 | Temp 98.6°F | Resp 18 | Ht 68.0 in | Wt 191.7 lb

## 2018-05-13 DIAGNOSIS — R05 Cough: Secondary | ICD-10-CM

## 2018-05-13 DIAGNOSIS — C9000 Multiple myeloma not having achieved remission: Secondary | ICD-10-CM

## 2018-05-13 DIAGNOSIS — K219 Gastro-esophageal reflux disease without esophagitis: Secondary | ICD-10-CM

## 2018-05-13 DIAGNOSIS — Z79899 Other long term (current) drug therapy: Secondary | ICD-10-CM

## 2018-05-13 DIAGNOSIS — Z5111 Encounter for antineoplastic chemotherapy: Secondary | ICD-10-CM

## 2018-05-13 DIAGNOSIS — M79675 Pain in left toe(s): Secondary | ICD-10-CM

## 2018-05-13 DIAGNOSIS — M79674 Pain in right toe(s): Secondary | ICD-10-CM

## 2018-05-13 DIAGNOSIS — M79676 Pain in unspecified toe(s): Secondary | ICD-10-CM

## 2018-05-13 LAB — CBC WITH DIFFERENTIAL (CANCER CENTER ONLY)
Abs Immature Granulocytes: 0.03 10*3/uL (ref 0.00–0.07)
Basophils Absolute: 0 10*3/uL (ref 0.0–0.1)
Basophils Relative: 0 %
Eosinophils Absolute: 0 10*3/uL (ref 0.0–0.5)
Eosinophils Relative: 0 %
HCT: 39.8 % (ref 36.0–46.0)
Hemoglobin: 13.6 g/dL (ref 12.0–15.0)
Immature Granulocytes: 1 %
LYMPHS PCT: 12 %
Lymphs Abs: 0.6 10*3/uL — ABNORMAL LOW (ref 0.7–4.0)
MCH: 30.6 pg (ref 26.0–34.0)
MCHC: 34.2 g/dL (ref 30.0–36.0)
MCV: 89.6 fL (ref 80.0–100.0)
Monocytes Absolute: 0.1 10*3/uL (ref 0.1–1.0)
Monocytes Relative: 2 %
Neutro Abs: 4.1 10*3/uL (ref 1.7–7.7)
Neutrophils Relative %: 85 %
Platelet Count: 110 10*3/uL — ABNORMAL LOW (ref 150–400)
RBC: 4.44 MIL/uL (ref 3.87–5.11)
RDW: 13.6 % (ref 11.5–15.5)
WBC Count: 4.9 10*3/uL (ref 4.0–10.5)
nRBC: 0 % (ref 0.0–0.2)

## 2018-05-13 LAB — CMP (CANCER CENTER ONLY)
ALT: 12 U/L (ref 0–44)
AST: 18 U/L (ref 15–41)
Albumin: 3.7 g/dL (ref 3.5–5.0)
Alkaline Phosphatase: 87 U/L (ref 38–126)
Anion gap: 12 (ref 5–15)
BUN: 14 mg/dL (ref 8–23)
CHLORIDE: 104 mmol/L (ref 98–111)
CO2: 22 mmol/L (ref 22–32)
CREATININE: 1.35 mg/dL — AB (ref 0.44–1.00)
Calcium: 9.2 mg/dL (ref 8.9–10.3)
GFR, Est AFR Am: 47 mL/min — ABNORMAL LOW (ref >60)
GFR, Estimated: 41 mL/min — ABNORMAL LOW (ref >60)
Glucose, Bld: 160 mg/dL — ABNORMAL HIGH (ref 70–99)
POTASSIUM: 4.5 mmol/L (ref 3.5–5.1)
Sodium: 138 mmol/L (ref 135–145)
Total Bilirubin: 0.4 mg/dL (ref 0.3–1.2)
Total Protein: 7.5 g/dL (ref 6.5–8.1)

## 2018-05-13 LAB — LACTATE DEHYDROGENASE: LDH: 237 U/L — ABNORMAL HIGH (ref 98–192)

## 2018-05-13 MED ORDER — PROCHLORPERAZINE MALEATE 10 MG PO TABS: 10.0000 mg | ORAL_TABLET | Freq: Four times a day (QID) | ORAL | 0 refills | Status: DC | PRN

## 2018-05-13 MED ORDER — BORTEZOMIB CHEMO SQ INJECTION 3.5 MG (2.5MG/ML)
1.3000 mg/m2 | Freq: Once | INTRAMUSCULAR | Status: AC
  Administered 2018-05-13: 2.75 mg via SUBCUTANEOUS
  Filled 2018-05-13: qty 1.1

## 2018-05-13 MED ORDER — OMEPRAZOLE 40 MG PO CPDR: 40.0000 mg | DELAYED_RELEASE_CAPSULE | Freq: Every day | ORAL | 0 refills | Status: DC

## 2018-05-13 MED ORDER — PROCHLORPERAZINE MALEATE 10 MG PO TABS
10.0000 mg | ORAL_TABLET | Freq: Once | ORAL | Status: AC
  Administered 2018-05-13: 10 mg via ORAL

## 2018-05-13 MED ORDER — OXYCODONE-ACETAMINOPHEN 5-325 MG PO TABS: 1.0000 | ORAL_TABLET | Freq: Three times a day (TID) | ORAL | 0 refills | Status: DC | PRN

## 2018-05-13 MED ORDER — PROCHLORPERAZINE MALEATE 10 MG PO TABS
ORAL_TABLET | ORAL | Status: AC
  Filled 2018-05-13: qty 1

## 2018-05-13 MED FILL — PROCHLORPERAZINE 10 MG TAB: 10 | 7 days supply | Qty: 30 | Fill #0

## 2018-05-13 MED FILL — OMEPRAZOLE 40 MG CPDR: 40 | 30 days supply | Qty: 30 | Fill #0

## 2018-05-13 MED FILL — DEXAMETHASONE 4 MG TABLET: 4 | 28 days supply | Qty: 40 | Fill #3

## 2018-05-13 MED FILL — OXYCODONE-ACETAMINOPHEN 5-3: 5-325 | 10 days supply | Qty: 30 | Fill #0

## 2018-05-13 NOTE — Patient Instructions (Signed)
Coronavirus (COVID-19) Are you at risk?  Are you at risk for the Coronavirus (COVID-19)?  To be considered HIGH RISK for Coronavirus (COVID-19), you have to meet the following criteria:  . Traveled to [place], [place], [place], [place] or [place]; or in the [place] to [place], [place], [place], or [place]; and have fever, cough, and shortness of breath within the last 2 weeks of travel OR . Been in close contact with a person diagnosed with COVID-19 within the last 2 weeks and have fever, cough, and shortness of breath . IF YOU DO NOT MEET THESE CRITERIA, YOU ARE CONSIDERED LOW RISK FOR COVID-19.  What to do if you are HIGH RISK for COVID-19?  . If you are having a medical emergency, call 911. . Seek medical care right away. Before you go to a doctor's office, urgent care or emergency department, call ahead and tell them about your recent travel, contact with someone diagnosed with COVID-19, and your symptoms. You should receive instructions from your physician's office regarding next steps of care.  . When you arrive at healthcare provider, tell the healthcare staff immediately you have returned from visiting [place], [place], [place], [place] or [place]; or traveled in the [place] to [place], [place], [place], or [place]; in the last two weeks or you have been in close contact with a person diagnosed with COVID-19 in the last 2 weeks.   . Tell the health care staff about your symptoms: fever, cough and shortness of breath. . After you have been seen by a medical provider, you will be either: o Tested for (COVID-19) and discharged home on quarantine except to seek medical care if symptoms worsen, and asked to  - Stay home and avoid contact with others until you get your results (4-5 days)  - Avoid travel on public transportation if possible (such as bus, train, or airplane) or o Sent to the Emergency Department by EMS for evaluation, COVID-19 testing, and possible  admission depending on your condition and test results.  What to do if you are LOW RISK for COVID-19?  Reduce your risk of any infection by using the same precautions used for avoiding the common cold or flu:  . Wash your hands often with soap and warm water for at least 20 seconds.  If soap and water are not readily available, use an alcohol-based hand sanitizer with at least 60% alcohol.  . If coughing or sneezing, cover your mouth and nose by coughing or sneezing into the elbow areas of your shirt or coat, into a tissue or into your sleeve (not your hands). . Avoid shaking hands with others and consider head nods or verbal greetings only. . Avoid touching your eyes, nose, or mouth with unwashed hands.  . Avoid close contact with people who are sick. . Avoid places or events with large numbers of people in one location, like concerts or sporting events. . Carefully consider travel plans you have or are making. . If you are planning any travel outside or inside the [place], visit the CDC's Travelers' Health webpage for the latest health notices. . If you have some symptoms but not all symptoms, continue to monitor at home and seek medical attention if your symptoms worsen. . If you are having a medical emergency, call 911.   ADDITIONAL HEALTHCARE OPTIONS FOR PATIENTS  [facility] / e-Visit: [email or web address]         [facility]: [phone]  [facility]: [phone]                   [facility]: [phone]   [facility] Discharge Instructions for Patients Receiving Chemotherapy  Today you received the following chemotherapy agents: Bortezomib (Velcade)  To help prevent nausea and vomiting after your treatment, we encourage you to take your nausea medication as directed.    If you develop nausea and vomiting that is not controlled by your nausea medication, call the clinic.    BELOW ARE SYMPTOMS THAT SHOULD BE REPORTED IMMEDIATELY:  *FEVER GREATER THAN 100.5 F  *CHILLS WITH OR WITHOUT FEVER  NAUSEA AND VOMITING THAT IS NOT CONTROLLED WITH YOUR NAUSEA MEDICATION  *UNUSUAL SHORTNESS OF BREATH  *UNUSUAL BRUISING OR BLEEDING  TENDERNESS IN MOUTH AND THROAT WITH OR WITHOUT PRESENCE OF ULCERS  *URINARY PROBLEMS  *BOWEL PROBLEMS  UNUSUAL RASH Items with * indicate a potential emergency and should be followed up as soon as possible.  Feel free to call the clinic should you have any questions or concerns. The clinic phone number is [phone].  Please show the CHEMO ALERT CARD at check-in to the Emergency Department and triage nurse.   

## 2018-05-13 NOTE — Telephone Encounter (Signed)
Gave avs and calendar ° °

## 2018-05-13 NOTE — Progress Notes (Signed)
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 daily for 14 days every 3 weeks in addition to weekly Decadron 40 mg orally. First dose of treatment 10/08/2017.Treatment was placed on hold due to development of a significant rash.Sheresumedtreatment with dexamethasone and Velcade on 11/19/2017. She will not resume treatment of Revlimid due to the rash.Status post 7 months of treatment.  INTERVAL HISTORY: Debbie Chan 68 y.o. female returns clinic for follow-up visit.  The patient is feeling fair today but continues to complain of significant pain bilaterally in her toes. Additionally, she also reports a productive cough with white/clear sputum for the last 4-5 days. She denies any sick contacts or associated nasal congestion, sore throat, fever, night sweats, shortness of breath, or chest pain. She also reports and redness and crusting around the eyes in the morning for the last few days. She continues to tolerate treatment well with Velcade and Decadron without any adverse side effects.  She denies any chest pain, shortness of breath, or hemoptysis.  She denies any nausea, vomiting, diarrhea, or constipation. She continues to take his prophylactic acyclovir. She denies any rashes or skin changes. She is here for evaluation and to review her recent blood work.    MEDICAL HISTORY:No past medical history on file.  ALLERGIES:  has No Known Allergies.  MEDICATIONS:  Current Outpatient Medications  Medication Sig Dispense Refill  . acyclovir (ZOVIRAX) 200 MG capsule TAKE 1 CAPSULE BY MOUTH 2 TIMES DAILY. 60 capsule 0  . blood glucose meter kit and supplies Dispense based on patient and insurance preference. Use up to four times daily as directed. (FOR ICD-10  E10.9, E11.9). (Patient not taking: Reported on 11/26/2017) 1 each 0  . dexamethasone (DECADRON) 4 MG tablet Take 10 tablets (40 mg total) by mouth every 7 (seven) days. 40 tablet 3  . diphenhydrAMINE (BENADRYL ALLERGY) 25 MG tablet Take 1 tablet (25 mg total) by mouth every 6 (six) hours as needed for itching. (Patient not taking: Reported on 11/26/2017) 100 tablet 0  . furosemide (LASIX) 20 MG tablet Take once daily for 3 days for lower extremity edema, then stop. Repeat as needed for recurrent edema. (Patient not taking: Reported on 12/24/2017) 30 tablet 2  . metoprolol tartrate (LOPRESSOR) 25 MG tablet Take 0.5 tablets (12.5 mg total) by mouth 2 (two) times daily. 30 tablet 0  . omeprazole (PRILOSEC) 40 MG capsule Take 1 capsule (40 mg total) by mouth daily. 30 capsule 0  . oxyCODONE-acetaminophen (PERCOCET/ROXICET) 5-325 MG tablet Take 1 tablet by mouth every 8 (eight) hours as needed for severe pain. 30 tablet 0  . polyethylene glycol (MIRALAX / GLYCOLAX) packet Take 17 g by mouth daily. (Patient not taking: Reported on 11/26/2017) 14 each 0  . prochlorperazine (COMPAZINE) 10 MG tablet Take 1 tablet (10 mg total) by mouth every 6 (six) hours as needed for nausea or vomiting. 30 tablet 0  . traZODone (DESYREL) 50 MG tablet Take 1 tablet (50 mg total) by mouth at bedtime. (Patient not taking: Reported on 12/24/2017) 30 tablet 0   No current facility-administered medications for this visit.    Facility-Administered Medications Ordered in Other Visits  Medication Dose Route Frequency Provider Last Rate Last Dose  . bortezomib SQ (VELCADE) chemo injection 2.75 mg  1.3 mg/m2 (Treatment Plan  Recorded) Subcutaneous Once Curt Bears, MD        SURGICAL HISTORY: No past surgical history on file.  REVIEW OF SYSTEMS:   Review of Systems  Constitutional: Negative for appetite change, chills, fatigue, fever and unexpected weight change.  HENT:   Negative for mouth sores, nosebleeds, sore throat and  trouble swallowing.   Eyes: Positive for eye redness and crusting in the morning. Negative for eye problems and icterus.  Respiratory: Positive for productive cough. Negative for hemoptysis, shortness of breath and wheezing.   Cardiovascular: Negative for chest pain and leg swelling.  Gastrointestinal: Negative for abdominal pain, constipation, diarrhea, nausea and vomiting.  Genitourinary: Negative for bladder incontinence, difficulty urinating, dysuria, frequency and hematuria.   Musculoskeletal: Positive for baseline low back pain and bilateral toe pain. Negative for gait problem, neck pain and neck stiffness.  Skin: Negative for itching and rash.  Neurological: Negative for dizziness, extremity weakness, gait problem, headaches, light-headedness and seizures.  Hematological: Negative for adenopathy. Does not bruise/bleed easily.  Psychiatric/Behavioral: Negative for confusion, depression and sleep disturbance. The patient is not nervous/anxious.     PHYSICAL EXAMINATION:  Blood pressure 117/63, pulse 87, temperature 98.6 F (37 C), temperature source Oral, resp. rate 18, height _0  (1.727 m), weight 191 lb 11.2 oz (87 kg), SpO2 99 %.  ECOG PERFORMANCE STATUS: 1 - Symptomatic but completely ambulatory  Physical Exam  Constitutional: Oriented to person, place, and time and well-developed, well-nourished, and in no distress. No distress.  HENT:  Head: Normocephalic and atraumatic.  Mouth/Throat: Oropharynx is clear and moist. No oropharyngeal exudate.  Eyes: Conjunctivae are normal. Right eye exhibits no discharge. Left eye exhibits no discharge. No scleral icterus.  Neck: Normal range of motion. Neck supple.  Cardiovascular: Normal rate, regular rhythm, normal heart sounds and intact distal pulses.   Pulmonary/Chest: Effort normal and breath sounds normal. No respiratory distress. No wheezes. No rales.  Abdominal: Soft. Bowel sounds are normal. Exhibits no distension and no mass.  There is no tenderness.  Musculoskeletal: Normal range of motion. Exhibits no edema.  Lymphadenopathy:    No cervical adenopathy.  Neurological: Alert and oriented to person, place, and time. Exhibits normal muscle tone. Gait normal. Coordination normal.  Skin: Skin is warm and dry. No rash noted. Not diaphoretic. No erythema. No pallor.  Psychiatric: Mood, memory and judgment normal.  Vitals reviewed.  LABORATORY DATA: Lab Results  Component Value Date   WBC 4.9 05/13/2018   HGB 13.6 05/13/2018   HCT 39.8 05/13/2018   MCV 89.6 05/13/2018   PLT 110 (L) 05/13/2018      Chemistry      Component Value Date/Time   NA 138 05/13/2018 1215   K 4.5 05/13/2018 1215   CL 104 05/13/2018 1215   CO2 22 05/13/2018 1215   BUN 14 05/13/2018 1215   CREATININE 1.35 (H) 05/13/2018 1215      Component Value Date/Time   CALCIUM 9.2 05/13/2018 1215   ALKPHOS 87 05/13/2018 1215   AST 18 05/13/2018 1215   ALT 12 05/13/2018 1215   BILITOT 0.4 05/13/2018 1215       RADIOGRAPHIC STUDIES:  No results found.   ASSESSMENT/PLAN:  This is a very pleasant 68 year old African female originally from [place] who is visiting her son in [place] and was recently diagnosed with IgA multiple myeloma. The patient was started initially on treatment with weekly subcutaneous Velcade 1.3 mg/M2, Revlimid 25 mg p.o. daily for 21 days every 4  weeks as well as weekly Decadron 40 mg orally.  She was tolerating the treatment well but she developed significant skin rash secondary to treatment with Revlimid and this was discontinued. The patient continued treatment with subcutaneous Velcade and Decadron. She is tolerating this treatment well with no concerning adverse effects except for mild peripheral neuropathy.  The patient was seen with Dr. Julien Nordmann today. The patient had a repeat myeloma panel performed recently.  Dr. Julien Nordmann personally discussed the lab results with the patient today. Her labs  showed stable disease. He recommends for the patient to continue her current treatment with weekly subcutaneous Velcade and Decadron. She will start cycle #9 today.  The patient will come back for follow-up visit in 3 weeks for evaluation and management of any adverse side effects of treatment.   Regarding the patient's significant toe pain, she was prescribed percocet 1 tablet every 8 hours as needed for pain.   Additionally, she was advised she may take Claritin OTC for her itchy and watery eyes. She also was advised to refrain from touching her eyes and was encouraged to wash hands frequently. She also was advised to wash her eyes with warm water before bed.   I have sent a refill for Prilosec and compazine to the patient's pharmacy.   She inquired about taking OTC cough medicine for her cough. Her vitals are within normal limits, the patient is afebrile, and her lungs are clear to ascultation. She was advised that she may take OTC cough medicine for her cough. The patient was advised that should she develop new or worsening symptoms to follow up with her primary care provider.   The patient was advised to call immediately if she has any concerning symptoms in the interval. The patient voices understanding of current disease status and treatment options and is in agreement with the current care plan. All questions were answered. The patient knows to call the clinic with any problems, questions or concerns. We can certainly see the patient much sooner if necessary  No orders of the defined types were placed in this encounter.    Debbie Putzier L Cane Dubray, PA-C 05/13/18  ADDENDUM: Hematology/Oncology Attending: I had a face-to-face encounter with the patient today.  I recommended her care plan.  This is a very pleasant 68 years old African female who was diagnosed with multiple myeloma and currently on treatment with subcutaneous Velcade and Decadron status post 8 cycles of treatment. She  has been tolerating this treatment well except for aching pain as well as peripheral neuropathy. She had repeat myeloma panel performed recently.  I discussed the lab results with the patient today.  Her myeloma panel in general showed a stable disease except for mild further increase in the free lambda light chain. Commended for the patient to continue her current treatment with Velcade and Decadron. For pain management we will give her prescription for Percocet 1 tablet p.o. every 8 hours as needed for pain. The patient was also asking about ability to travel to [place] and I explained to her that we may be able to give her a break for 2 months if needed to travel and visit her home country. She will come back for follow-up visit in 1 months for evaluation. She was advised to call immediately if she has any concerning symptoms in the interval.  Disclaimer: This note was dictated with voice recognition software. Similar sounding words can inadvertently be transcribed and may be missed upon review. Eilleen Kempf, MD 05/13/18

## 2018-05-13 NOTE — Telephone Encounter (Signed)
Confirmed with Cassie, f/u is every 3 weeks, not 4. Scheduled til end of treatment plan, 6/11. Gave avs and calendar

## 2018-05-20 ENCOUNTER — Other Ambulatory Visit: Payer: Self-pay

## 2018-05-20 ENCOUNTER — Inpatient Hospital Stay: Payer: Self-pay

## 2018-05-20 VITALS — BP 149/75 | HR 76 | Temp 97.9°F | Resp 17 | Wt 191.8 lb

## 2018-05-20 DIAGNOSIS — C9000 Multiple myeloma not having achieved remission: Secondary | ICD-10-CM

## 2018-05-20 LAB — CBC WITH DIFFERENTIAL (CANCER CENTER ONLY)
Abs Immature Granulocytes: 0.02 10*3/uL (ref 0.00–0.07)
Basophils Absolute: 0 10*3/uL (ref 0.0–0.1)
Basophils Relative: 1 %
EOS PCT: 2 %
Eosinophils Absolute: 0.1 10*3/uL (ref 0.0–0.5)
HCT: 39.5 % (ref 36.0–46.0)
HEMOGLOBIN: 13.2 g/dL (ref 12.0–15.0)
Immature Granulocytes: 1 %
Lymphocytes Relative: 25 %
Lymphs Abs: 1.1 10*3/uL (ref 0.7–4.0)
MCH: 30.3 pg (ref 26.0–34.0)
MCHC: 33.4 g/dL (ref 30.0–36.0)
MCV: 90.6 fL (ref 80.0–100.0)
Monocytes Absolute: 0.7 10*3/uL (ref 0.1–1.0)
Monocytes Relative: 16 %
Neutro Abs: 2.5 10*3/uL (ref 1.7–7.7)
Neutrophils Relative %: 55 %
Platelet Count: 105 10*3/uL — ABNORMAL LOW (ref 150–400)
RBC: 4.36 MIL/uL (ref 3.87–5.11)
RDW: 13.6 % (ref 11.5–15.5)
WBC Count: 4.4 10*3/uL (ref 4.0–10.5)
nRBC: 0 % (ref 0.0–0.2)

## 2018-05-20 LAB — CMP (CANCER CENTER ONLY)
ALBUMIN: 3.4 g/dL — AB (ref 3.5–5.0)
ALT: 11 U/L (ref 0–44)
AST: 21 U/L (ref 15–41)
Alkaline Phosphatase: 77 U/L (ref 38–126)
Anion gap: 9 (ref 5–15)
BUN: 15 mg/dL (ref 8–23)
CHLORIDE: 105 mmol/L (ref 98–111)
CO2: 26 mmol/L (ref 22–32)
Calcium: 8.8 mg/dL — ABNORMAL LOW (ref 8.9–10.3)
Creatinine: 1.12 mg/dL — ABNORMAL HIGH (ref 0.44–1.00)
GFR, Est AFR Am: 58 mL/min — ABNORMAL LOW (ref >60)
GFR, Estimated: 50 mL/min — ABNORMAL LOW (ref >60)
Glucose, Bld: 70 mg/dL (ref 70–99)
Potassium: 4.2 mmol/L (ref 3.5–5.1)
Sodium: 140 mmol/L (ref 135–145)
Total Bilirubin: 0.4 mg/dL (ref 0.3–1.2)
Total Protein: 7 g/dL (ref 6.5–8.1)

## 2018-05-20 MED ORDER — PROCHLORPERAZINE MALEATE 10 MG PO TABS
ORAL_TABLET | ORAL | Status: AC
  Filled 2018-05-20: qty 1

## 2018-05-20 MED ORDER — BORTEZOMIB CHEMO SQ INJECTION 3.5 MG (2.5MG/ML)
1.3000 mg/m2 | Freq: Once | INTRAMUSCULAR | Status: AC
  Administered 2018-05-20: 2.75 mg via SUBCUTANEOUS
  Filled 2018-05-20: qty 1.1

## 2018-05-20 MED ORDER — PROCHLORPERAZINE MALEATE 10 MG PO TABS: 10.0000 mg | ORAL_TABLET | Freq: Once | ORAL | Status: DC

## 2018-05-20 NOTE — Patient Instructions (Signed)
[facility] Discharge Instructions for Patients Receiving Chemotherapy  Today you received the following chemotherapy agents Velcade.  To help prevent nausea and vomiting after your treatment, we encourage you to take your nausea medication as directed.  If you develop nausea and vomiting that is not controlled by your nausea medication, call the clinic.   BELOW ARE SYMPTOMS THAT SHOULD BE REPORTED IMMEDIATELY:  *FEVER GREATER THAN 100.5 F  *CHILLS WITH OR WITHOUT FEVER  NAUSEA AND VOMITING THAT IS NOT CONTROLLED WITH YOUR NAUSEA MEDICATION  *UNUSUAL SHORTNESS OF BREATH  *UNUSUAL BRUISING OR BLEEDING  TENDERNESS IN MOUTH AND THROAT WITH OR WITHOUT PRESENCE OF ULCERS  *URINARY PROBLEMS  *BOWEL PROBLEMS  UNUSUAL RASH Items with * indicate a potential emergency and should be followed up as soon as possible.  Feel free to call the clinic should you have any questions or concerns. The clinic phone number is [phone].  Please show the CHEMO ALERT CARD at check-in to the Emergency Department and triage nurse.   

## 2018-05-27 ENCOUNTER — Other Ambulatory Visit: Payer: Self-pay

## 2018-05-27 ENCOUNTER — Inpatient Hospital Stay: Payer: Self-pay

## 2018-05-27 ENCOUNTER — Inpatient Hospital Stay: Payer: Self-pay | Attending: Internal Medicine

## 2018-05-27 ENCOUNTER — Encounter: Payer: Self-pay | Admitting: General Practice

## 2018-05-27 VITALS — BP 148/82 | HR 65 | Temp 97.8°F | Resp 18

## 2018-05-27 DIAGNOSIS — G629 Polyneuropathy, unspecified: Secondary | ICD-10-CM | POA: Insufficient documentation

## 2018-05-27 DIAGNOSIS — R5383 Other fatigue: Secondary | ICD-10-CM | POA: Insufficient documentation

## 2018-05-27 DIAGNOSIS — C9000 Multiple myeloma not having achieved remission: Secondary | ICD-10-CM | POA: Insufficient documentation

## 2018-05-27 DIAGNOSIS — R21 Rash and other nonspecific skin eruption: Secondary | ICD-10-CM | POA: Insufficient documentation

## 2018-05-27 DIAGNOSIS — Z5111 Encounter for antineoplastic chemotherapy: Secondary | ICD-10-CM | POA: Insufficient documentation

## 2018-05-27 DIAGNOSIS — Z79899 Other long term (current) drug therapy: Secondary | ICD-10-CM | POA: Insufficient documentation

## 2018-05-27 DIAGNOSIS — M545 Low back pain: Secondary | ICD-10-CM | POA: Insufficient documentation

## 2018-05-27 LAB — CMP (CANCER CENTER ONLY)
ALT: 11 U/L (ref 0–44)
AST: 18 U/L (ref 15–41)
Albumin: 3.6 g/dL (ref 3.5–5.0)
Alkaline Phosphatase: 76 U/L (ref 38–126)
Anion gap: 9 (ref 5–15)
BUN: 16 mg/dL (ref 8–23)
CO2: 28 mmol/L (ref 22–32)
Calcium: 9.1 mg/dL (ref 8.9–10.3)
Chloride: 104 mmol/L (ref 98–111)
Creatinine: 1.13 mg/dL — ABNORMAL HIGH (ref 0.44–1.00)
GFR, Est AFR Am: 58 mL/min — ABNORMAL LOW (ref >60)
GFR, Estimated: 50 mL/min — ABNORMAL LOW (ref >60)
Glucose, Bld: 79 mg/dL (ref 70–99)
Potassium: 4.4 mmol/L (ref 3.5–5.1)
Sodium: 141 mmol/L (ref 135–145)
Total Bilirubin: 0.6 mg/dL (ref 0.3–1.2)
Total Protein: 7.2 g/dL (ref 6.5–8.1)

## 2018-05-27 LAB — CBC WITH DIFFERENTIAL (CANCER CENTER ONLY)
Abs Immature Granulocytes: 0.03 10*3/uL (ref 0.00–0.07)
Basophils Absolute: 0 10*3/uL (ref 0.0–0.1)
Basophils Relative: 1 %
Eosinophils Absolute: 0.1 10*3/uL (ref 0.0–0.5)
Eosinophils Relative: 2 %
HCT: 40.5 % (ref 36.0–46.0)
Hemoglobin: 13.5 g/dL (ref 12.0–15.0)
Immature Granulocytes: 1 %
Lymphocytes Relative: 20 %
Lymphs Abs: 1.1 10*3/uL (ref 0.7–4.0)
MCH: 30.5 pg (ref 26.0–34.0)
MCHC: 33.3 g/dL (ref 30.0–36.0)
MCV: 91.4 fL (ref 80.0–100.0)
Monocytes Absolute: 0.7 10*3/uL (ref 0.1–1.0)
Monocytes Relative: 12 %
Neutro Abs: 3.5 10*3/uL (ref 1.7–7.7)
Neutrophils Relative %: 64 %
Platelet Count: 127 10*3/uL — ABNORMAL LOW (ref 150–400)
RBC: 4.43 MIL/uL (ref 3.87–5.11)
RDW: 14 % (ref 11.5–15.5)
WBC Count: 5.4 10*3/uL (ref 4.0–10.5)
nRBC: 0 % (ref 0.0–0.2)

## 2018-05-27 MED ORDER — BORTEZOMIB CHEMO SQ INJECTION 3.5 MG (2.5MG/ML)
1.3000 mg/m2 | Freq: Once | INTRAMUSCULAR | Status: AC
  Administered 2018-05-27: 2.75 mg via SUBCUTANEOUS
  Filled 2018-05-27: qty 1.1

## 2018-05-27 MED ORDER — DEXAMETHASONE 4 MG PO TABS
ORAL_TABLET | ORAL | Status: AC
  Filled 2018-05-27: qty 10

## 2018-05-27 MED ORDER — PROCHLORPERAZINE MALEATE 10 MG PO TABS: 10.0000 mg | ORAL_TABLET | Freq: Once | ORAL | Status: DC

## 2018-05-27 MED ORDER — DEXAMETHASONE 4 MG PO TABS
40.0000 mg | ORAL_TABLET | Freq: Once | ORAL | Status: AC
  Administered 2018-05-27: 10:00:00 40 mg via ORAL

## 2018-05-27 MED FILL — ACYCLOVIR 200 MG CAP: 200 | 30 days supply | Qty: 60 | Fill #1

## 2018-05-27 NOTE — Progress Notes (Signed)
[place] Team contacted patient to assess for food insecurity and other psychosocial needs during current COVID19 pandemic.  Lives w daughter and son, no needs.    Patient/family expressed no needs at this time.  Support Team member encouraged patient to call if changes occur or they have any other questions/concerns.    Beverely Pace, [place]

## 2018-05-27 NOTE — Patient Instructions (Signed)
[facility] Discharge Instructions for Patients Receiving Chemotherapy  Today you received the following chemotherapy agents: Velcade  To help prevent nausea and vomiting after your treatment, we encourage you to take your nausea medication as directed.   If you develop nausea and vomiting that is not controlled by your nausea medication, call the clinic.   BELOW ARE SYMPTOMS THAT SHOULD BE REPORTED IMMEDIATELY:  *FEVER GREATER THAN 100.5 F  *CHILLS WITH OR WITHOUT FEVER  NAUSEA AND VOMITING THAT IS NOT CONTROLLED WITH YOUR NAUSEA MEDICATION  *UNUSUAL SHORTNESS OF BREATH  *UNUSUAL BRUISING OR BLEEDING  TENDERNESS IN MOUTH AND THROAT WITH OR WITHOUT PRESENCE OF ULCERS  *URINARY PROBLEMS  *BOWEL PROBLEMS  UNUSUAL RASH Items with * indicate a potential emergency and should be followed up as soon as possible.  Feel free to call the clinic should you have any questions or concerns. The clinic phone number is [phone].  Please show the [place] at check-in to the Emergency Department and triage nurse.  Coronavirus (COVID-19) Are you at risk?  Are you at risk for the Coronavirus (COVID-19)?  To be considered HIGH RISK for Coronavirus (COVID-19), you have to meet the following criteria:  . Traveled to [place], [place], [place], [place] or [place]; or in the [place] to [place], [place], [place], or [place]; and have fever, cough, and shortness of breath within the last 2 weeks of travel OR . Been in close contact with a person diagnosed with COVID-19 within the last 2 weeks and have fever, cough, and shortness of breath . IF YOU DO NOT MEET THESE CRITERIA, YOU ARE CONSIDERED LOW RISK FOR COVID-19.  What to do if you are HIGH RISK for COVID-19?  Marland Kitchen If you are having a medical emergency, call 911. . Seek medical care right away. Before you go to a doctor's office, urgent care or emergency department, call ahead and tell them about your recent  travel, contact with someone diagnosed with COVID-19, and your symptoms. You should receive instructions from your physician's office regarding next steps of care.  . When you arrive at healthcare provider, tell the healthcare staff immediately you have returned from visiting [place], [place], [place], [place] or [place]; or traveled in the [place] to [place], [place], [place], or [place]; in the last two weeks or you have been in close contact with a person diagnosed with COVID-19 in the last 2 weeks.   . Tell the health care staff about your symptoms: fever, cough and shortness of breath. . After you have been seen by a medical provider, you will be either: o Tested for (COVID-19) and discharged home on quarantine except to seek medical care if symptoms worsen, and asked to  - Stay home and avoid contact with others until you get your results (4-5 days)  - Avoid travel on public transportation if possible (such as bus, train, or airplane) or o Sent to the Emergency Department by EMS for evaluation, COVID-19 testing, and possible admission depending on your condition and test results.  What to do if you are LOW RISK for COVID-19?  Reduce your risk of any infection by using the same precautions used for avoiding the common cold or flu:  Marland Kitchen Wash your hands often with soap and warm water for at least 20 seconds.  If soap and water are not readily available, use an alcohol-based hand sanitizer with at least 60% alcohol.  . If coughing or sneezing,  cover your mouth and nose by coughing or sneezing into the elbow areas of your shirt or coat, into a tissue or into your sleeve (not your hands). . Avoid shaking hands with others and consider head nods or verbal greetings only. . Avoid touching your eyes, nose, or mouth with unwashed hands.  . Avoid close contact with people who are sick. . Avoid places or events with large numbers of people in one location, like concerts or sporting  events. . Carefully consider travel plans you have or are making. . If you are planning any travel outside or inside the [place], visit the CDC's Travelers' Health webpage for the latest health notices. . If you have some symptoms but not all symptoms, continue to monitor at home and seek medical attention if your symptoms worsen. . If you are having a medical emergency, call 911.   [facility] / e-Visit: [email or web address]         [facility]: [facility]: [phone]                   [facility]: [phone]

## 2018-06-02 MED FILL — METOPROLOL TARTRATE 25 MG T: 25 | 30 days supply | Qty: 30 | Fill #0

## 2018-06-03 ENCOUNTER — Encounter: Payer: Self-pay | Admitting: Internal Medicine

## 2018-06-03 ENCOUNTER — Inpatient Hospital Stay (HOSPITAL_BASED_OUTPATIENT_CLINIC_OR_DEPARTMENT_OTHER): Payer: Self-pay | Admitting: Internal Medicine

## 2018-06-03 ENCOUNTER — Inpatient Hospital Stay: Payer: Self-pay

## 2018-06-03 ENCOUNTER — Other Ambulatory Visit: Payer: Self-pay

## 2018-06-03 ENCOUNTER — Ambulatory Visit: Payer: Self-pay

## 2018-06-03 VITALS — BP 137/90 | HR 76 | Temp 98.4°F | Resp 18 | Ht 68.0 in | Wt 192.3 lb

## 2018-06-03 VITALS — BP 138/66 | HR 73 | Temp 98.4°F | Resp 18 | Ht 68.0 in | Wt 193.0 lb

## 2018-06-03 DIAGNOSIS — R5383 Other fatigue: Secondary | ICD-10-CM

## 2018-06-03 DIAGNOSIS — C9000 Multiple myeloma not having achieved remission: Secondary | ICD-10-CM

## 2018-06-03 DIAGNOSIS — Z79899 Other long term (current) drug therapy: Secondary | ICD-10-CM

## 2018-06-03 DIAGNOSIS — Z5111 Encounter for antineoplastic chemotherapy: Secondary | ICD-10-CM

## 2018-06-03 DIAGNOSIS — R21 Rash and other nonspecific skin eruption: Secondary | ICD-10-CM

## 2018-06-03 DIAGNOSIS — G629 Polyneuropathy, unspecified: Secondary | ICD-10-CM

## 2018-06-03 LAB — CBC WITH DIFFERENTIAL (CANCER CENTER ONLY)
Abs Immature Granulocytes: 0.01 10*3/uL (ref 0.00–0.07)
Basophils Absolute: 0 10*3/uL (ref 0.0–0.1)
Basophils Relative: 0 %
Eosinophils Absolute: 0 10*3/uL (ref 0.0–0.5)
Eosinophils Relative: 0 %
HCT: 39.3 % (ref 36.0–46.0)
Hemoglobin: 12.9 g/dL (ref 12.0–15.0)
Immature Granulocytes: 0 %
Lymphocytes Relative: 14 %
Lymphs Abs: 0.7 10*3/uL (ref 0.7–4.0)
MCH: 30.1 pg (ref 26.0–34.0)
MCHC: 32.8 g/dL (ref 30.0–36.0)
MCV: 91.8 fL (ref 80.0–100.0)
Monocytes Absolute: 0.2 10*3/uL (ref 0.1–1.0)
Monocytes Relative: 4 %
Neutro Abs: 4 10*3/uL (ref 1.7–7.7)
Neutrophils Relative %: 82 %
Platelet Count: 121 10*3/uL — ABNORMAL LOW (ref 150–400)
RBC: 4.28 MIL/uL (ref 3.87–5.11)
RDW: 14.1 % (ref 11.5–15.5)
WBC Count: 5 10*3/uL (ref 4.0–10.5)
nRBC: 0 % (ref 0.0–0.2)

## 2018-06-03 LAB — CMP (CANCER CENTER ONLY)
ALT: 13 U/L (ref 0–44)
AST: 20 U/L (ref 15–41)
Albumin: 3.7 g/dL (ref 3.5–5.0)
Alkaline Phosphatase: 76 U/L (ref 38–126)
Anion gap: 11 (ref 5–15)
BUN: 16 mg/dL (ref 8–23)
CO2: 24 mmol/L (ref 22–32)
Calcium: 8.9 mg/dL (ref 8.9–10.3)
Chloride: 106 mmol/L (ref 98–111)
Creatinine: 1.37 mg/dL — ABNORMAL HIGH (ref 0.44–1.00)
GFR, Est AFR Am: 46 mL/min — ABNORMAL LOW (ref >60)
GFR, Estimated: 40 mL/min — ABNORMAL LOW (ref >60)
Glucose, Bld: 99 mg/dL (ref 70–99)
Potassium: 4.7 mmol/L (ref 3.5–5.1)
Sodium: 141 mmol/L (ref 135–145)
Total Bilirubin: 0.4 mg/dL (ref 0.3–1.2)
Total Protein: 7.5 g/dL (ref 6.5–8.1)

## 2018-06-03 MED ORDER — GABAPENTIN 100 MG PO CAPS: 100.0000 mg | ORAL_CAPSULE | Freq: Three times a day (TID) | ORAL | 1 refills | Status: DC

## 2018-06-03 MED ORDER — PROCHLORPERAZINE MALEATE 10 MG PO TABS: 10.0000 mg | ORAL_TABLET | Freq: Once | ORAL | Status: DC

## 2018-06-03 MED ORDER — BORTEZOMIB CHEMO SQ INJECTION 3.5 MG (2.5MG/ML)
1.3000 mg/m2 | Freq: Once | INTRAMUSCULAR | Status: AC
  Administered 2018-06-03: 13:00:00 2.75 mg via SUBCUTANEOUS
  Filled 2018-06-03: qty 1.1

## 2018-06-03 MED FILL — GABAPENTIN 100 MG CAPSULE: 100 | 30 days supply | Qty: 90 | Fill #0

## 2018-06-03 NOTE — Progress Notes (Signed)
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  PRIOR THERAPY: None  CURRENT THERAPY: Systemic therapy with Velcade 1.3 mg/M2 weekly, Revlimid 25 mg p.o. daily for 14 days every 3 weeks in addition to weekly Decadron 40 mg orally. First dose of treatment 10/08/2017.Treatment was placed on hold due to development of a significant rash.Sheresumedtreatment with dexamethasone and Velcade on 11/19/2017. She will not resume treatment of Revlimid due to the rash.  Status post 7 months of treatment.  INTERVAL HISTORY: Debbie Chan 68 y.o. female returns to the clinic today for follow-up visit.  The patient is feeling fine today with no concerning complaints except for the peripheral neuropathy in the lower extremities.  She also has mild fatigue and dryness of her eyes.  She denied having any fever or chills.  She has no nausea, vomiting, diarrhea or constipation.  She denied having any chest pain, shortness of breath, cough or hemoptysis.  She continues to tolerate her treatment with Velcade and Decadron fairly well.  The patient is here today for evaluation before starting the next cycle of her treatment.   MEDICAL HISTORY:No past medical history on file.  ALLERGIES:  has No Known Allergies.  MEDICATIONS:  Current Outpatient Medications  Medication Sig Dispense Refill  . acyclovir (ZOVIRAX) 200 MG capsule TAKE 1 CAPSULE BY MOUTH 2 TIMES DAILY. 60 capsule 0  . blood glucose meter kit and supplies Dispense based on patient and insurance preference. Use up to four times daily as directed. (FOR ICD-10 E10.9, E11.9). (Patient not taking: Reported on 11/26/2017) 1 each 0  . dexamethasone (DECADRON) 4 MG tablet Take 10 tablets (40 mg total) by mouth every 7 (seven) days. 40 tablet 3  . diphenhydrAMINE (BENADRYL ALLERGY)  25 MG tablet Take 1 tablet (25 mg total) by mouth every 6 (six) hours as needed for itching. (Patient not taking: Reported on 11/26/2017) 100 tablet 0  . furosemide (LASIX) 20 MG tablet Take once daily for 3 days for lower extremity edema, then stop. Repeat as needed for recurrent edema. (Patient not taking: Reported on 12/24/2017) 30 tablet 2  . metoprolol tartrate (LOPRESSOR) 25 MG tablet Take 0.5 tablets (12.5 mg total) by mouth 2 (two) times daily. 30 tablet 0  . omeprazole (PRILOSEC) 40 MG capsule Take 1 capsule (40 mg total) by mouth daily. 30 capsule 0  . oxyCODONE-acetaminophen (PERCOCET/ROXICET) 5-325 MG tablet Take 1 tablet by mouth every 8 (eight) hours as needed for severe pain. 30 tablet 0  . polyethylene glycol (MIRALAX / GLYCOLAX) packet Take 17 g by mouth daily. (Patient not taking: Reported on 11/26/2017) 14 each 0  . prochlorperazine (COMPAZINE) 10 MG tablet Take 1 tablet (10 mg total) by mouth every 6 (six) hours as needed for nausea or vomiting. 30 tablet 0  . traZODone (DESYREL) 50 MG tablet Take 1 tablet (50 mg total) by mouth at bedtime. (Patient not taking: Reported on 12/24/2017) 30 tablet 0   No current facility-administered medications for this visit.     SURGICAL HISTORY: No past surgical history on file.  REVIEW OF SYSTEMS:  A comprehensive review of systems was negative except for: Constitutional: positive for fatigue Musculoskeletal: positive for arthralgias Neurological: positive for paresthesia   PHYSICAL EXAMINATION: General appearance: alert, cooperative, fatigued and no distress Head: Normocephalic, without obvious abnormality, atraumatic Neck: no adenopathy, no  JVD, supple, symmetrical, trachea midline and thyroid not enlarged, symmetric, no tenderness/mass/nodules Lymph nodes: Cervical, supraclavicular, and axillary nodes normal. Resp: clear to auscultation bilaterally Back: symmetric, no curvature. ROM normal. No CVA tenderness. Cardio: regular rate and  rhythm, S1, S2 normal, no murmur, click, rub or gallop GI: soft, non-tender; bowel sounds normal; no masses,  no organomegaly Extremities: extremities normal, atraumatic, no cyanosis or edema  ECOG PERFORMANCE STATUS: 1 - Symptomatic but completely ambulatory  Blood pressure 137/90, pulse 76, temperature 98.4 F (36.9 C), temperature source Oral, resp. rate 18, height '5\' 8"'  (1.727 m), weight 192 lb 4.8 oz (87.2 kg), SpO2 100 %.  LABORATORY DATA: Lab Results  Component Value Date   WBC 5.0 06/03/2018   HGB 12.9 06/03/2018   HCT 39.3 06/03/2018   MCV 91.8 06/03/2018   PLT 121 (L) 06/03/2018      Chemistry      Component Value Date/Time   NA 141 05/27/2018 0850   K 4.4 05/27/2018 0850   CL 104 05/27/2018 0850   CO2 28 05/27/2018 0850   BUN 16 05/27/2018 0850   CREATININE 1.13 (H) 05/27/2018 0850      Component Value Date/Time   CALCIUM 9.1 05/27/2018 0850   ALKPHOS 76 05/27/2018 0850   AST 18 05/27/2018 0850   ALT 11 05/27/2018 0850   BILITOT 0.6 05/27/2018 0850       RADIOGRAPHIC STUDIES: No results found.  ASSESSMENT AND PLAN: This is a very pleasant 68 years old African female originally from [place] who is visiting her son in [place] and was recently diagnosed with IgA multiple myeloma. The patient was a started initially on treatment with weekly subcutaneous Velcade 1.3 mg/M2, Revlimid 25 mg p.o. daily for 21 days every 4 weeks as well as weekly Decadron 40 mg orally.  She was tolerating the treatment well but she developed significant skin rash secondary to treatment with Revlimid and this was discontinued. The patient continued treatment with subcutaneous Velcade and Decadron. The patient continues to tolerate this treatment well except for mild fatigue and peripheral neuropathy. I recommended for her to proceed with cycle #8 today as scheduled. For the peripheral neuropathy, I will start the patient on gabapentin 100 mg p.o. 3 times daily. She  will come back for follow-up visit in 4 weeks for evaluation before starting the next cycle of her treatment. The patient was advised to call immediately if she has any concerning symptoms in the interval. The patient voices understanding of current disease status and treatment options and is in agreement with the current care plan.  All questions were answered. The patient knows to call the clinic with any problems, questions or concerns. We can certainly see the patient much sooner if necessary.  Disclaimer: This note was dictated with voice recognition software. Similar sounding words can inadvertently be transcribed and may not be corrected upon review.

## 2018-06-03 NOTE — Patient Instructions (Signed)
[facility] Discharge Instructions for Patients Receiving Chemotherapy  Today you received the following chemotherapy agent:  Velcade.  To help prevent nausea and vomiting after your treatment, we encourage you to take your nausea medication as directed.   If you develop nausea and vomiting that is not controlled by your nausea medication, call the clinic.   BELOW ARE SYMPTOMS THAT SHOULD BE REPORTED IMMEDIATELY:  *FEVER GREATER THAN 100.5 F  *CHILLS WITH OR WITHOUT FEVER  NAUSEA AND VOMITING THAT IS NOT CONTROLLED WITH YOUR NAUSEA MEDICATION  *UNUSUAL SHORTNESS OF BREATH  *UNUSUAL BRUISING OR BLEEDING  TENDERNESS IN MOUTH AND THROAT WITH OR WITHOUT PRESENCE OF ULCERS  *URINARY PROBLEMS  *BOWEL PROBLEMS  UNUSUAL RASH Items with * indicate a potential emergency and should be followed up as soon as possible.  Feel free to call the clinic should you have any questions or concerns. The clinic phone number is [phone].  Please show the CHEMO ALERT CARD at check-in to the Emergency Department and triage nurse.   

## 2018-06-10 ENCOUNTER — Inpatient Hospital Stay: Payer: Self-pay

## 2018-06-10 ENCOUNTER — Other Ambulatory Visit: Payer: Self-pay

## 2018-06-10 VITALS — BP 135/76 | HR 72 | Temp 98.5°F | Resp 16

## 2018-06-10 DIAGNOSIS — C9000 Multiple myeloma not having achieved remission: Secondary | ICD-10-CM

## 2018-06-10 LAB — CMP (CANCER CENTER ONLY)
ALT: 12 U/L (ref 0–44)
AST: 20 U/L (ref 15–41)
Albumin: 3.5 g/dL (ref 3.5–5.0)
Alkaline Phosphatase: 69 U/L (ref 38–126)
Anion gap: 10 (ref 5–15)
BUN: 10 mg/dL (ref 8–23)
CO2: 26 mmol/L (ref 22–32)
Calcium: 9 mg/dL (ref 8.9–10.3)
Chloride: 103 mmol/L (ref 98–111)
Creatinine: 1.16 mg/dL — ABNORMAL HIGH (ref 0.44–1.00)
GFR, Est AFR Am: 56 mL/min — ABNORMAL LOW (ref >60)
GFR, Estimated: 48 mL/min — ABNORMAL LOW (ref >60)
Glucose, Bld: 66 mg/dL — ABNORMAL LOW (ref 70–99)
Potassium: 4 mmol/L (ref 3.5–5.1)
Sodium: 139 mmol/L (ref 135–145)
Total Bilirubin: 0.5 mg/dL (ref 0.3–1.2)
Total Protein: 7 g/dL (ref 6.5–8.1)

## 2018-06-10 LAB — CBC WITH DIFFERENTIAL (CANCER CENTER ONLY)
Abs Immature Granulocytes: 0.01 10*3/uL (ref 0.00–0.07)
Basophils Absolute: 0 10*3/uL (ref 0.0–0.1)
Basophils Relative: 0 %
Eosinophils Absolute: 0.1 10*3/uL (ref 0.0–0.5)
Eosinophils Relative: 2 %
HCT: 39.1 % (ref 36.0–46.0)
Hemoglobin: 12.9 g/dL (ref 12.0–15.0)
Immature Granulocytes: 0 %
Lymphocytes Relative: 20 %
Lymphs Abs: 0.8 10*3/uL (ref 0.7–4.0)
MCH: 30 pg (ref 26.0–34.0)
MCHC: 33 g/dL (ref 30.0–36.0)
MCV: 90.9 fL (ref 80.0–100.0)
Monocytes Absolute: 0.4 10*3/uL (ref 0.1–1.0)
Monocytes Relative: 10 %
Neutro Abs: 2.8 10*3/uL (ref 1.7–7.7)
Neutrophils Relative %: 68 %
Platelet Count: 96 10*3/uL — ABNORMAL LOW (ref 150–400)
RBC: 4.3 MIL/uL (ref 3.87–5.11)
RDW: 13.8 % (ref 11.5–15.5)
WBC Count: 4.1 10*3/uL (ref 4.0–10.5)
nRBC: 0 % (ref 0.0–0.2)

## 2018-06-10 MED ORDER — PROCHLORPERAZINE MALEATE 10 MG PO TABS: 10.0000 mg | ORAL_TABLET | Freq: Once | ORAL | Status: DC

## 2018-06-10 MED ORDER — BORTEZOMIB CHEMO SQ INJECTION 3.5 MG (2.5MG/ML)
1.3000 mg/m2 | Freq: Once | INTRAMUSCULAR | Status: AC
  Administered 2018-06-10: 10:00:00 2.75 mg via SUBCUTANEOUS
  Filled 2018-06-10: qty 1.1

## 2018-06-10 NOTE — Progress Notes (Signed)
Per Dr. Julien Nordmann, proceed with chemo today as per order.

## 2018-06-10 NOTE — Patient Instructions (Signed)
[facility] Discharge Instructions for Patients Receiving Chemotherapy  Today you received the following chemotherapy agent:  Velcade.  To help prevent nausea and vomiting after your treatment, we encourage you to take your nausea medication as directed.   If you develop nausea and vomiting that is not controlled by your nausea medication, call the clinic.   BELOW ARE SYMPTOMS THAT SHOULD BE REPORTED IMMEDIATELY:  *FEVER GREATER THAN 100.5 F  *CHILLS WITH OR WITHOUT FEVER  NAUSEA AND VOMITING THAT IS NOT CONTROLLED WITH YOUR NAUSEA MEDICATION  *UNUSUAL SHORTNESS OF BREATH  *UNUSUAL BRUISING OR BLEEDING  TENDERNESS IN MOUTH AND THROAT WITH OR WITHOUT PRESENCE OF ULCERS  *URINARY PROBLEMS  *BOWEL PROBLEMS  UNUSUAL RASH Items with * indicate a potential emergency and should be followed up as soon as possible.  Feel free to call the clinic should you have any questions or concerns. The clinic phone number is [phone].  Please show the CHEMO ALERT CARD at check-in to the Emergency Department and triage nurse.   

## 2018-06-17 ENCOUNTER — Other Ambulatory Visit: Payer: Self-pay

## 2018-06-17 ENCOUNTER — Other Ambulatory Visit: Payer: Self-pay | Admitting: Physician Assistant

## 2018-06-17 ENCOUNTER — Inpatient Hospital Stay: Payer: Self-pay

## 2018-06-17 VITALS — BP 129/71 | HR 79 | Temp 98.2°F | Resp 18

## 2018-06-17 DIAGNOSIS — C9000 Multiple myeloma not having achieved remission: Secondary | ICD-10-CM

## 2018-06-17 DIAGNOSIS — K219 Gastro-esophageal reflux disease without esophagitis: Secondary | ICD-10-CM

## 2018-06-17 LAB — CBC WITH DIFFERENTIAL (CANCER CENTER ONLY)
Abs Immature Granulocytes: 0.01 10*3/uL (ref 0.00–0.07)
Basophils Absolute: 0 10*3/uL (ref 0.0–0.1)
Basophils Relative: 1 %
Eosinophils Absolute: 0.1 10*3/uL (ref 0.0–0.5)
Eosinophils Relative: 2 %
HCT: 39.3 % (ref 36.0–46.0)
Hemoglobin: 13.1 g/dL (ref 12.0–15.0)
Immature Granulocytes: 0 %
Lymphocytes Relative: 22 %
Lymphs Abs: 0.9 10*3/uL (ref 0.7–4.0)
MCH: 30.8 pg (ref 26.0–34.0)
MCHC: 33.3 g/dL (ref 30.0–36.0)
MCV: 92.3 fL (ref 80.0–100.0)
Monocytes Absolute: 0.4 10*3/uL (ref 0.1–1.0)
Monocytes Relative: 10 %
Neutro Abs: 2.7 10*3/uL (ref 1.7–7.7)
Neutrophils Relative %: 65 %
Platelet Count: 107 10*3/uL — ABNORMAL LOW (ref 150–400)
RBC: 4.26 MIL/uL (ref 3.87–5.11)
RDW: 14 % (ref 11.5–15.5)
WBC Count: 4 10*3/uL (ref 4.0–10.5)
nRBC: 0 % (ref 0.0–0.2)

## 2018-06-17 LAB — CMP (CANCER CENTER ONLY)
ALT: 12 U/L (ref 0–44)
AST: 20 U/L (ref 15–41)
Albumin: 3.5 g/dL (ref 3.5–5.0)
Alkaline Phosphatase: 75 U/L (ref 38–126)
Anion gap: 11 (ref 5–15)
BUN: 13 mg/dL (ref 8–23)
CO2: 25 mmol/L (ref 22–32)
Calcium: 8.9 mg/dL (ref 8.9–10.3)
Chloride: 104 mmol/L (ref 98–111)
Creatinine: 1.27 mg/dL — ABNORMAL HIGH (ref 0.44–1.00)
GFR, Est AFR Am: 50 mL/min — ABNORMAL LOW (ref >60)
GFR, Estimated: 43 mL/min — ABNORMAL LOW (ref >60)
Glucose, Bld: 92 mg/dL (ref 70–99)
Potassium: 4.3 mmol/L (ref 3.5–5.1)
Sodium: 140 mmol/L (ref 135–145)
Total Bilirubin: 0.4 mg/dL (ref 0.3–1.2)
Total Protein: 7.2 g/dL (ref 6.5–8.1)

## 2018-06-17 MED ORDER — BORTEZOMIB CHEMO SQ INJECTION 3.5 MG (2.5MG/ML)
1.3000 mg/m2 | Freq: Once | INTRAMUSCULAR | Status: AC
  Administered 2018-06-17: 10:00:00 2.75 mg via SUBCUTANEOUS
  Filled 2018-06-17: qty 1.1

## 2018-06-17 MED ORDER — PROCHLORPERAZINE MALEATE 10 MG PO TABS
ORAL_TABLET | ORAL | Status: AC
  Filled 2018-06-17: qty 1

## 2018-06-17 MED ORDER — PROCHLORPERAZINE MALEATE 10 MG PO TABS: 10.0000 mg | ORAL_TABLET | Freq: Once | ORAL | Status: DC

## 2018-06-17 MED FILL — OMEPRAZOLE 40 MG CPDR: 40 | 30 days supply | Qty: 30 | Fill #0

## 2018-06-17 NOTE — Patient Instructions (Signed)
[facility] Discharge Instructions for Patients Receiving Chemotherapy  Today you received the following chemotherapy agent:  Velcade.  To help prevent nausea and vomiting after your treatment, we encourage you to take your nausea medication as directed.   If you develop nausea and vomiting that is not controlled by your nausea medication, call the clinic.   BELOW ARE SYMPTOMS THAT SHOULD BE REPORTED IMMEDIATELY:  *FEVER GREATER THAN 100.5 F  *CHILLS WITH OR WITHOUT FEVER  NAUSEA AND VOMITING THAT IS NOT CONTROLLED WITH YOUR NAUSEA MEDICATION  *UNUSUAL SHORTNESS OF BREATH  *UNUSUAL BRUISING OR BLEEDING  TENDERNESS IN MOUTH AND THROAT WITH OR WITHOUT PRESENCE OF ULCERS  *URINARY PROBLEMS  *BOWEL PROBLEMS  UNUSUAL RASH Items with * indicate a potential emergency and should be followed up as soon as possible.  Feel free to call the clinic should you have any questions or concerns. The clinic phone number is [phone].  Please show the CHEMO ALERT CARD at check-in to the Emergency Department and triage nurse.   

## 2018-06-24 ENCOUNTER — Inpatient Hospital Stay: Payer: Self-pay

## 2018-06-24 ENCOUNTER — Inpatient Hospital Stay (HOSPITAL_BASED_OUTPATIENT_CLINIC_OR_DEPARTMENT_OTHER): Payer: Self-pay | Admitting: Internal Medicine

## 2018-06-24 ENCOUNTER — Other Ambulatory Visit: Payer: Self-pay

## 2018-06-24 ENCOUNTER — Encounter: Payer: Self-pay | Admitting: Internal Medicine

## 2018-06-24 VITALS — BP 134/51 | HR 75 | Temp 98.1°F | Resp 18 | Ht 68.0 in | Wt 195.0 lb

## 2018-06-24 DIAGNOSIS — G629 Polyneuropathy, unspecified: Secondary | ICD-10-CM

## 2018-06-24 DIAGNOSIS — M545 Low back pain: Secondary | ICD-10-CM

## 2018-06-24 DIAGNOSIS — C9000 Multiple myeloma not having achieved remission: Secondary | ICD-10-CM

## 2018-06-24 DIAGNOSIS — Z5111 Encounter for antineoplastic chemotherapy: Secondary | ICD-10-CM

## 2018-06-24 DIAGNOSIS — R21 Rash and other nonspecific skin eruption: Secondary | ICD-10-CM

## 2018-06-24 DIAGNOSIS — R5383 Other fatigue: Secondary | ICD-10-CM

## 2018-06-24 DIAGNOSIS — Z79899 Other long term (current) drug therapy: Secondary | ICD-10-CM

## 2018-06-24 LAB — CBC WITH DIFFERENTIAL (CANCER CENTER ONLY)
Abs Immature Granulocytes: 0.01 10*3/uL (ref 0.00–0.07)
Basophils Absolute: 0 10*3/uL (ref 0.0–0.1)
Basophils Relative: 0 %
Eosinophils Absolute: 0.1 10*3/uL (ref 0.0–0.5)
Eosinophils Relative: 3 %
HCT: 36.7 % (ref 36.0–46.0)
Hemoglobin: 12.4 g/dL (ref 12.0–15.0)
Immature Granulocytes: 0 %
Lymphocytes Relative: 27 %
Lymphs Abs: 0.9 10*3/uL (ref 0.7–4.0)
MCH: 30.9 pg (ref 26.0–34.0)
MCHC: 33.8 g/dL (ref 30.0–36.0)
MCV: 91.5 fL (ref 80.0–100.0)
Monocytes Absolute: 0.4 10*3/uL (ref 0.1–1.0)
Monocytes Relative: 11 %
Neutro Abs: 1.9 10*3/uL (ref 1.7–7.7)
Neutrophils Relative %: 59 %
Platelet Count: 107 10*3/uL — ABNORMAL LOW (ref 150–400)
RBC: 4.01 MIL/uL (ref 3.87–5.11)
RDW: 13.6 % (ref 11.5–15.5)
WBC Count: 3.3 10*3/uL — ABNORMAL LOW (ref 4.0–10.5)
nRBC: 0 % (ref 0.0–0.2)

## 2018-06-24 LAB — CMP (CANCER CENTER ONLY)
ALT: 10 U/L (ref 0–44)
AST: 19 U/L (ref 15–41)
Albumin: 3.5 g/dL (ref 3.5–5.0)
Alkaline Phosphatase: 71 U/L (ref 38–126)
Anion gap: 11 (ref 5–15)
BUN: 10 mg/dL (ref 8–23)
CO2: 25 mmol/L (ref 22–32)
Calcium: 8.7 mg/dL — ABNORMAL LOW (ref 8.9–10.3)
Chloride: 105 mmol/L (ref 98–111)
Creatinine: 1.24 mg/dL — ABNORMAL HIGH (ref 0.44–1.00)
GFR, Est AFR Am: 52 mL/min — ABNORMAL LOW (ref >60)
GFR, Estimated: 45 mL/min — ABNORMAL LOW (ref >60)
Glucose, Bld: 83 mg/dL (ref 70–99)
Potassium: 4.1 mmol/L (ref 3.5–5.1)
Sodium: 141 mmol/L (ref 135–145)
Total Bilirubin: 0.4 mg/dL (ref 0.3–1.2)
Total Protein: 7.1 g/dL (ref 6.5–8.1)

## 2018-06-24 MED ORDER — ACYCLOVIR 200 MG PO CAPS: ORAL_CAPSULE | ORAL | 0 refills | Status: DC

## 2018-06-24 MED ORDER — BORTEZOMIB CHEMO SQ INJECTION 3.5 MG (2.5MG/ML)
1.3000 mg/m2 | Freq: Once | INTRAMUSCULAR | Status: AC
  Administered 2018-06-24: 10:00:00 2.75 mg via SUBCUTANEOUS
  Filled 2018-06-24: qty 1.1

## 2018-06-24 MED ORDER — DEXAMETHASONE 4 MG PO TABS: 40.0000 mg | ORAL_TABLET | ORAL | 3 refills | Status: DC

## 2018-06-24 MED ORDER — PROCHLORPERAZINE MALEATE 10 MG PO TABS: 10.0000 mg | ORAL_TABLET | Freq: Once | ORAL | Status: DC

## 2018-06-24 MED FILL — ACYCLOVIR 200 MG CAP: 200 | 30 days supply | Qty: 60 | Fill #0

## 2018-06-24 MED FILL — DEXAMETHASONE 4 MG TABLET: 4 | 7 days supply | Qty: 40 | Fill #0

## 2018-06-24 NOTE — Patient Instructions (Addendum)
[facility] Discharge Instructions for Patients Receiving Chemotherapy  Today you received the following chemotherapy agent: Bortezomib (VELCADE).   To help prevent nausea and vomiting after your treatment, we encourage you to take your nausea medication as directed.   If you develop nausea and vomiting that is not controlled by your nausea medication, call the clinic.   BELOW ARE SYMPTOMS THAT SHOULD BE REPORTED IMMEDIATELY:  *FEVER GREATER THAN 100.5 F  *CHILLS WITH OR WITHOUT FEVER  NAUSEA AND VOMITING THAT IS NOT CONTROLLED WITH YOUR NAUSEA MEDICATION  *UNUSUAL SHORTNESS OF BREATH  *UNUSUAL BRUISING OR BLEEDING  TENDERNESS IN MOUTH AND THROAT WITH OR WITHOUT PRESENCE OF ULCERS  *URINARY PROBLEMS  *BOWEL PROBLEMS  UNUSUAL RASH Items with * indicate a potential emergency and should be followed up as soon as possible.  Feel free to call the clinic should you have any questions or concerns. The clinic phone number is [phone].  Please show the [facility] at check-in to the Emergency Department and triage nurse.  Coronavirus (COVID-19) Are you at risk?  Are you at risk for the Coronavirus (COVID-19)?  To be considered HIGH RISK for Coronavirus (COVID-19), you have to meet the following criteria:  . Traveled to [place], [place], [place], [place] or [place]; or in the [place] to [place], [place], [place], or [place]; and have fever, cough, and shortness of breath within the last 2 weeks of travel OR . Been in close contact with a person diagnosed with COVID-19 within the last 2 weeks and have fever, cough, and shortness of breath . IF YOU DO NOT MEET THESE CRITERIA, YOU ARE CONSIDERED LOW RISK FOR COVID-19.  What to do if you are HIGH RISK for COVID-19?  Marland Kitchen If you are having a medical emergency, call 911. . Seek medical care right away. Before you go to a doctor's office, urgent care or emergency department, call ahead and tell them  about your recent travel, contact with someone diagnosed with COVID-19, and your symptoms. You should receive instructions from your physician's office regarding next steps of care.  . When you arrive at healthcare provider, tell the healthcare staff immediately you have returned from visiting [place], [place], [place], [place] or [place]; or traveled in the [place] to [place], [place], [place], or [place]; in the last two weeks or you have been in close contact with a person diagnosed with COVID-19 in the last 2 weeks.   . Tell the health care staff about your symptoms: fever, cough and shortness of breath. . After you have been seen by a medical provider, you will be either: o Tested for (COVID-19) and discharged home on quarantine except to seek medical care if symptoms worsen, and asked to  - Stay home and avoid contact with others until you get your results (4-5 days)  - Avoid travel on public transportation if possible (such as bus, train, or airplane) or o Sent to the Emergency Department by EMS for evaluation, COVID-19 testing, and possible admission depending on your condition and test results.  What to do if you are LOW RISK for COVID-19?  Reduce your risk of any infection by using the same precautions used for avoiding the common cold or flu:  Marland Kitchen Wash your hands often with soap and warm water for at least 20 seconds.  If soap and water are not readily available, use an alcohol-based hand sanitizer with at least 60% alcohol.  . If coughing  or sneezing, cover your mouth and nose by coughing or sneezing into the elbow areas of your shirt or coat, into a tissue or into your sleeve (not your hands). . Avoid shaking hands with others and consider head nods or verbal greetings only. . Avoid touching your eyes, nose, or mouth with unwashed hands.  . Avoid close contact with people who are sick. . Avoid places or events with large numbers of people in one location, like concerts or  sporting events. . Carefully consider travel plans you have or are making. . If you are planning any travel outside or inside the [place], visit the CDC's Travelers' Health webpage for the latest health notices. . If you have some symptoms but not all symptoms, continue to monitor at home and seek medical attention if your symptoms worsen. . If you are having a medical emergency, call 911.   ADDITIONAL HEALTHCARE OPTIONS FOR PATIENTS  [facility] / e-Visit: [email or web address]         [facility]: [phone]  [facility]: [phone]                   [facility]: [phone]   

## 2018-06-24 NOTE — Progress Notes (Signed)
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  PRIOR THERAPY: None  CURRENT THERAPY: Systemic therapy with Velcade 1.3 mg/M2 weekly, Revlimid 25 mg p.o. daily for 14 days every 3 weeks in addition to weekly Decadron 40 mg orally. First dose of treatment 10/08/2017.Treatment was placed on hold due to development of a significant rash.Sheresumedtreatment with dexamethasone and Velcade on 11/19/2017. She will not resume treatment of Revlimid due to the rash.  Status post 8 months of treatment.  INTERVAL HISTORY: Debbie Chan 68 y.o. female returns to the clinic today for follow-up visit.  The patient is feeling fine today with no concerning complaints except for the low back pain with radiation to the leg more on the left side.  She also has mild peripheral neuropathy and she is currently on gabapentin.  She denied having any chest pain, shortness of breath, cough or hemoptysis.  She has no fever or chills.  She has no nausea, vomiting, diarrhea or constipation.  She has been tolerating her treatment with Velcade and Decadron fairly well.  She is here today to start cycle #9 of her treatment.    MEDICAL HISTORY:No past medical history on file.  ALLERGIES:  has No Known Allergies.  MEDICATIONS:  Current Outpatient Medications  Medication Sig Dispense Refill  . acyclovir (ZOVIRAX) 200 MG capsule TAKE 1 CAPSULE BY MOUTH 2 TIMES DAILY. 60 capsule 0  . blood glucose meter kit and supplies Dispense based on patient and insurance preference. Use up to four times daily as directed. (FOR ICD-10 E10.9, E11.9). (Patient not taking: Reported on 11/26/2017) 1 each 0  . dexamethasone (DECADRON) 4 MG tablet Take 10 tablets (40 mg total) by mouth every 7 (seven) days. 40 tablet 3  . diphenhydrAMINE (BENADRYL ALLERGY) 25 MG  tablet Take 1 tablet (25 mg total) by mouth every 6 (six) hours as needed for itching. (Patient not taking: Reported on 11/26/2017) 100 tablet 0  . furosemide (LASIX) 20 MG tablet Take once daily for 3 days for lower extremity edema, then stop. Repeat as needed for recurrent edema. (Patient not taking: Reported on 12/24/2017) 30 tablet 2  . gabapentin (NEURONTIN) 100 MG capsule Take 1 capsule (100 mg total) by mouth 3 (three) times daily. 90 capsule 1  . metoprolol tartrate (LOPRESSOR) 25 MG tablet Take 0.5 tablets (12.5 mg total) by mouth 2 (two) times daily. 30 tablet 0  . omeprazole (PRILOSEC) 40 MG capsule TAKE 1 CAPSULE BY MOUTH ONCE A DAY 30 capsule 0  . oxyCODONE-acetaminophen (PERCOCET/ROXICET) 5-325 MG tablet Take 1 tablet by mouth every 8 (eight) hours as needed for severe pain. 30 tablet 0  . polyethylene glycol (MIRALAX / GLYCOLAX) packet Take 17 g by mouth daily. (Patient not taking: Reported on 11/26/2017) 14 each 0  . prochlorperazine (COMPAZINE) 10 MG tablet Take 1 tablet (10 mg total) by mouth every 6 (six) hours as needed for nausea or vomiting. 30 tablet 0  . RELION GLUCOSE TEST STRIPS test strip USE 1 TO CHECK GLUCOSE ONCE DAILY    . ReliOn Ultra Thin Lancets 30G MISC USE 1 TO CHECK GLUCOSE ONCE DAILY FOR GLUCOSE TESTING    . traZODone (DESYREL) 50 MG tablet Take 1 tablet (50 mg total) by mouth at bedtime. (Patient not taking: Reported on 12/24/2017) 30 tablet 0   No current facility-administered medications  for this visit.     SURGICAL HISTORY: No past surgical history on file.  REVIEW OF SYSTEMS:  A comprehensive review of systems was negative except for: Constitutional: positive for fatigue Musculoskeletal: positive for arthralgias and back pain Neurological: positive for paresthesia   PHYSICAL EXAMINATION: General appearance: alert, cooperative, fatigued and no distress Head: Normocephalic, without obvious abnormality, atraumatic Neck: no adenopathy, no JVD, supple,  symmetrical, trachea midline and thyroid not enlarged, symmetric, no tenderness/mass/nodules Lymph nodes: Cervical, supraclavicular, and axillary nodes normal. Resp: clear to auscultation bilaterally Back: symmetric, no curvature. ROM normal. No CVA tenderness. Cardio: regular rate and rhythm, S1, S2 normal, no murmur, click, rub or gallop GI: soft, non-tender; bowel sounds normal; no masses,  no organomegaly Extremities: extremities normal, atraumatic, no cyanosis or edema  ECOG PERFORMANCE STATUS: 1 - Symptomatic but completely ambulatory  Blood pressure (!) 134/51, pulse 75, temperature 98.1 F (36.7 C), temperature source Oral, resp. rate 18, height _0  (1.727 m), weight 195 lb (88.5 kg), SpO2 100 %.  LABORATORY DATA: Lab Results  Component Value Date   WBC 3.3 (L) 06/24/2018   HGB 12.4 06/24/2018   HCT 36.7 06/24/2018   MCV 91.5 06/24/2018   PLT 107 (L) 06/24/2018      Chemistry      Component Value Date/Time   NA 141 06/24/2018 0809   K 4.1 06/24/2018 0809   CL 105 06/24/2018 0809   CO2 25 06/24/2018 0809   BUN 10 06/24/2018 0809   CREATININE 1.24 (H) 06/24/2018 0809      Component Value Date/Time   CALCIUM 8.7 (L) 06/24/2018 0809   ALKPHOS 71 06/24/2018 0809   AST 19 06/24/2018 0809   ALT 10 06/24/2018 0809   BILITOT 0.4 06/24/2018 0809       RADIOGRAPHIC STUDIES: No results found.  ASSESSMENT AND PLAN: This is a very pleasant 68 years old African female originally from [place] who is visiting her son in [place] and was recently diagnosed with IgA multiple myeloma. The patient was a started initially on treatment with weekly subcutaneous Velcade 1.3 mg/M2, Revlimid 25 mg p.o. daily for 21 days every 4 weeks as well as weekly Decadron 40 mg orally.  She was tolerating the treatment well but she developed significant skin rash secondary to treatment with Revlimid and this was discontinued. The patient continued treatment with subcutaneous  Velcade and Decadron. The patient continues to tolerate this treatment fairly well except for mild fatigue and peripheral neuropathy. I recommended for her to proceed with cycle #9 today as planned. For the peripheral neuropathy, I will start the patient on gabapentin 100 mg p.o. 3 times daily. She will come back for follow-up visit in 4 weeks for evaluation before starting the next cycle of her treatment. I gave her refill of Decadron and acyclovir today. The patient was advised to call immediately if she has any concerning symptoms in the interval. The patient voices understanding of current disease status and treatment options and is in agreement with the current care plan. All questions were answered. The patient knows to call the clinic with any problems, questions or concerns. We can certainly see the patient much sooner if necessary.  Disclaimer: This note was dictated with voice recognition software. Similar sounding words can inadvertently be transcribed and may not be corrected upon review.

## 2018-07-01 ENCOUNTER — Inpatient Hospital Stay: Payer: Self-pay

## 2018-07-01 ENCOUNTER — Other Ambulatory Visit: Payer: Self-pay

## 2018-07-01 ENCOUNTER — Telehealth: Payer: Self-pay | Admitting: Medical Oncology

## 2018-07-01 ENCOUNTER — Inpatient Hospital Stay: Payer: Self-pay | Attending: Internal Medicine

## 2018-07-01 VITALS — BP 147/69 | HR 76 | Temp 98.4°F | Resp 18 | Wt 196.2 lb

## 2018-07-01 DIAGNOSIS — G629 Polyneuropathy, unspecified: Secondary | ICD-10-CM | POA: Insufficient documentation

## 2018-07-01 DIAGNOSIS — Z5111 Encounter for antineoplastic chemotherapy: Secondary | ICD-10-CM | POA: Insufficient documentation

## 2018-07-01 DIAGNOSIS — R21 Rash and other nonspecific skin eruption: Secondary | ICD-10-CM | POA: Insufficient documentation

## 2018-07-01 DIAGNOSIS — Z79899 Other long term (current) drug therapy: Secondary | ICD-10-CM | POA: Insufficient documentation

## 2018-07-01 DIAGNOSIS — C9000 Multiple myeloma not having achieved remission: Secondary | ICD-10-CM

## 2018-07-01 LAB — CMP (CANCER CENTER ONLY)
ALT: 11 U/L (ref 0–44)
AST: 18 U/L (ref 15–41)
Albumin: 3.5 g/dL (ref 3.5–5.0)
Alkaline Phosphatase: 74 U/L (ref 38–126)
Anion gap: 9 (ref 5–15)
BUN: 15 mg/dL (ref 8–23)
CO2: 27 mmol/L (ref 22–32)
Calcium: 8.9 mg/dL (ref 8.9–10.3)
Chloride: 103 mmol/L (ref 98–111)
Creatinine: 1.11 mg/dL — ABNORMAL HIGH (ref 0.44–1.00)
GFR, Est AFR Am: 59 mL/min — ABNORMAL LOW (ref >60)
GFR, Estimated: 51 mL/min — ABNORMAL LOW (ref >60)
Glucose, Bld: 82 mg/dL (ref 70–99)
Potassium: 4.6 mmol/L (ref 3.5–5.1)
Sodium: 139 mmol/L (ref 135–145)
Total Bilirubin: 0.4 mg/dL (ref 0.3–1.2)
Total Protein: 7.2 g/dL (ref 6.5–8.1)

## 2018-07-01 LAB — CBC WITH DIFFERENTIAL (CANCER CENTER ONLY)
Abs Immature Granulocytes: 0.01 10*3/uL (ref 0.00–0.07)
Basophils Absolute: 0 10*3/uL (ref 0.0–0.1)
Basophils Relative: 0 %
Eosinophils Absolute: 0.1 10*3/uL (ref 0.0–0.5)
Eosinophils Relative: 1 %
HCT: 39.9 % (ref 36.0–46.0)
Hemoglobin: 13.3 g/dL (ref 12.0–15.0)
Immature Granulocytes: 0 %
Lymphocytes Relative: 21 %
Lymphs Abs: 1 10*3/uL (ref 0.7–4.0)
MCH: 30.6 pg (ref 26.0–34.0)
MCHC: 33.3 g/dL (ref 30.0–36.0)
MCV: 91.9 fL (ref 80.0–100.0)
Monocytes Absolute: 0.3 10*3/uL (ref 0.1–1.0)
Monocytes Relative: 6 %
Neutro Abs: 3.3 10*3/uL (ref 1.7–7.7)
Neutrophils Relative %: 72 %
Platelet Count: 99 10*3/uL — ABNORMAL LOW (ref 150–400)
RBC: 4.34 MIL/uL (ref 3.87–5.11)
RDW: 13.4 % (ref 11.5–15.5)
WBC Count: 4.6 10*3/uL (ref 4.0–10.5)
nRBC: 0 % (ref 0.0–0.2)

## 2018-07-01 MED ORDER — PROCHLORPERAZINE MALEATE 10 MG PO TABS: 10.0000 mg | ORAL_TABLET | Freq: Once | ORAL | Status: DC

## 2018-07-01 MED ORDER — BORTEZOMIB CHEMO SQ INJECTION 3.5 MG (2.5MG/ML)
1.3000 mg/m2 | Freq: Once | INTRAMUSCULAR | Status: AC
  Administered 2018-07-01: 2.75 mg via SUBCUTANEOUS
  Filled 2018-07-01: qty 1.1

## 2018-07-01 NOTE — Telephone Encounter (Signed)
LVM for Fola to tell pt she needs to get Systane or generic for her eye redness.

## 2018-07-01 NOTE — Patient Instructions (Signed)
[facility] Discharge Instructions for Patients Receiving Chemotherapy  Today you received the following chemotherapy agent: Bortezomib (VELCADE).   To help prevent nausea and vomiting after your treatment, we encourage you to take your nausea medication as directed.   If you develop nausea and vomiting that is not controlled by your nausea medication, call the clinic.   BELOW ARE SYMPTOMS THAT SHOULD BE REPORTED IMMEDIATELY:  *FEVER GREATER THAN 100.5 F  *CHILLS WITH OR WITHOUT FEVER  NAUSEA AND VOMITING THAT IS NOT CONTROLLED WITH YOUR NAUSEA MEDICATION  *UNUSUAL SHORTNESS OF BREATH  *UNUSUAL BRUISING OR BLEEDING  TENDERNESS IN MOUTH AND THROAT WITH OR WITHOUT PRESENCE OF ULCERS  *URINARY PROBLEMS  *BOWEL PROBLEMS  UNUSUAL RASH Items with * indicate a potential emergency and should be followed up as soon as possible.  Feel free to call the clinic should you have any questions or concerns. The clinic phone number is [phone].  Please show the [facility] at check-in to the Emergency Department and triage nurse.  Coronavirus (COVID-19) Are you at risk?  Are you at risk for the Coronavirus (COVID-19)?  To be considered HIGH RISK for Coronavirus (COVID-19), you have to meet the following criteria:  . Traveled to [place], [place], [place], [place] or [place]; or in the [place] to [place], [place], [place], or [place]; and have fever, cough, and shortness of breath within the last 2 weeks of travel OR . Been in close contact with a person diagnosed with COVID-19 within the last 2 weeks and have fever, cough, and shortness of breath . IF YOU DO NOT MEET THESE CRITERIA, YOU ARE CONSIDERED LOW RISK FOR COVID-19.  What to do if you are HIGH RISK for COVID-19?  Marland Kitchen If you are having a medical emergency, call 911. . Seek medical care right away. Before you go to a doctor's office, urgent care or emergency department, call ahead and tell them  about your recent travel, contact with someone diagnosed with COVID-19, and your symptoms. You should receive instructions from your physician's office regarding next steps of care.  . When you arrive at healthcare provider, tell the healthcare staff immediately you have returned from visiting [place], [place], [place], [place] or [place]; or traveled in the [place] to [place], [place], [place], or [place]; in the last two weeks or you have been in close contact with a person diagnosed with COVID-19 in the last 2 weeks.   . Tell the health care staff about your symptoms: fever, cough and shortness of breath. . After you have been seen by a medical provider, you will be either: o Tested for (COVID-19) and discharged home on quarantine except to seek medical care if symptoms worsen, and asked to  - Stay home and avoid contact with others until you get your results (4-5 days)  - Avoid travel on public transportation if possible (such as bus, train, or airplane) or o Sent to the Emergency Department by EMS for evaluation, COVID-19 testing, and possible admission depending on your condition and test results.  What to do if you are LOW RISK for COVID-19?  Reduce your risk of any infection by using the same precautions used for avoiding the common cold or flu:  Marland Kitchen Wash your hands often with soap and warm water for at least 20 seconds.  If soap and water are not readily available, use an alcohol-based hand sanitizer with at least 60% alcohol.  . If coughing  or sneezing, cover your mouth and nose by coughing or sneezing into the elbow areas of your shirt or coat, into a tissue or into your sleeve (not your hands). . Avoid shaking hands with others and consider head nods or verbal greetings only. . Avoid touching your eyes, nose, or mouth with unwashed hands.  . Avoid close contact with people who are sick. . Avoid places or events with large numbers of people in one location, like concerts or  sporting events. . Carefully consider travel plans you have or are making. . If you are planning any travel outside or inside the US, visit the CDC's Travelers' Health webpage for the latest health notices. . If you have some symptoms but not all symptoms, continue to monitor at home and seek medical attention if your symptoms worsen. . If you are having a medical emergency, call 911.   ADDITIONAL HEALTHCARE OPTIONS FOR PATIENTS  [facility] / e-Visit: [email or web address]         [facility]: [phone]  [facility]: [phone]                   [facility]: [phone]   

## 2018-07-01 NOTE — Progress Notes (Signed)
Okay to treat with platelets of 99 per Dr. Julien Nordmann. Took dexamethasone at home.

## 2018-07-08 ENCOUNTER — Inpatient Hospital Stay: Payer: Self-pay

## 2018-07-08 ENCOUNTER — Other Ambulatory Visit: Payer: Self-pay

## 2018-07-08 VITALS — BP 156/80 | HR 75 | Temp 98.3°F | Resp 18 | Wt 195.0 lb

## 2018-07-08 DIAGNOSIS — C9000 Multiple myeloma not having achieved remission: Secondary | ICD-10-CM

## 2018-07-08 LAB — CMP (CANCER CENTER ONLY)
ALT: 13 U/L (ref 0–44)
AST: 20 U/L (ref 15–41)
Albumin: 3.5 g/dL (ref 3.5–5.0)
Alkaline Phosphatase: 73 U/L (ref 38–126)
Anion gap: 10 (ref 5–15)
BUN: 13 mg/dL (ref 8–23)
CO2: 26 mmol/L (ref 22–32)
Calcium: 8.8 mg/dL — ABNORMAL LOW (ref 8.9–10.3)
Chloride: 103 mmol/L (ref 98–111)
Creatinine: 1.18 mg/dL — ABNORMAL HIGH (ref 0.44–1.00)
GFR, Est AFR Am: 55 mL/min — ABNORMAL LOW (ref >60)
GFR, Estimated: 47 mL/min — ABNORMAL LOW (ref >60)
Glucose, Bld: 91 mg/dL (ref 70–99)
Potassium: 4 mmol/L (ref 3.5–5.1)
Sodium: 139 mmol/L (ref 135–145)
Total Bilirubin: 0.4 mg/dL (ref 0.3–1.2)
Total Protein: 7.1 g/dL (ref 6.5–8.1)

## 2018-07-08 LAB — CBC WITH DIFFERENTIAL (CANCER CENTER ONLY)
Abs Immature Granulocytes: 0 10*3/uL (ref 0.00–0.07)
Basophils Absolute: 0 10*3/uL (ref 0.0–0.1)
Basophils Relative: 1 %
Eosinophils Absolute: 0.1 10*3/uL (ref 0.0–0.5)
Eosinophils Relative: 2 %
HCT: 38.6 % (ref 36.0–46.0)
Hemoglobin: 12.9 g/dL (ref 12.0–15.0)
Immature Granulocytes: 0 %
Lymphocytes Relative: 24 %
Lymphs Abs: 0.9 10*3/uL (ref 0.7–4.0)
MCH: 30.9 pg (ref 26.0–34.0)
MCHC: 33.4 g/dL (ref 30.0–36.0)
MCV: 92.3 fL (ref 80.0–100.0)
Monocytes Absolute: 0.3 10*3/uL (ref 0.1–1.0)
Monocytes Relative: 9 %
Neutro Abs: 2.3 10*3/uL (ref 1.7–7.7)
Neutrophils Relative %: 64 %
Platelet Count: 98 10*3/uL — ABNORMAL LOW (ref 150–400)
RBC: 4.18 MIL/uL (ref 3.87–5.11)
RDW: 13.5 % (ref 11.5–15.5)
WBC Count: 3.6 10*3/uL — ABNORMAL LOW (ref 4.0–10.5)
nRBC: 0 % (ref 0.0–0.2)

## 2018-07-08 MED ORDER — BORTEZOMIB CHEMO SQ INJECTION 3.5 MG (2.5MG/ML)
1.3000 mg/m2 | Freq: Once | INTRAMUSCULAR | Status: AC
  Administered 2018-07-08: 11:00:00 2.75 mg via SUBCUTANEOUS
  Filled 2018-07-08: qty 1.1

## 2018-07-08 MED ORDER — PROCHLORPERAZINE MALEATE 10 MG PO TABS: 10.0000 mg | ORAL_TABLET | Freq: Once | ORAL | Status: DC

## 2018-07-08 NOTE — Patient Instructions (Signed)
[facility] Discharge Instructions for Patients Receiving Chemotherapy  Today you received the following chemotherapy agent: Bortezomib (VELCADE).   To help prevent nausea and vomiting after your treatment, we encourage you to take your nausea medication as directed.   If you develop nausea and vomiting that is not controlled by your nausea medication, call the clinic.   BELOW ARE SYMPTOMS THAT SHOULD BE REPORTED IMMEDIATELY:  *FEVER GREATER THAN 100.5 F  *CHILLS WITH OR WITHOUT FEVER  NAUSEA AND VOMITING THAT IS NOT CONTROLLED WITH YOUR NAUSEA MEDICATION  *UNUSUAL SHORTNESS OF BREATH  *UNUSUAL BRUISING OR BLEEDING  TENDERNESS IN MOUTH AND THROAT WITH OR WITHOUT PRESENCE OF ULCERS  *URINARY PROBLEMS  *BOWEL PROBLEMS  UNUSUAL RASH Items with * indicate a potential emergency and should be followed up as soon as possible.  Feel free to call the clinic should you have any questions or concerns. The clinic phone number is [phone].  Please show the [facility] at check-in to the Emergency Department and triage nurse.  Coronavirus (COVID-19) Are you at risk?  Are you at risk for the Coronavirus (COVID-19)?  To be considered HIGH RISK for Coronavirus (COVID-19), you have to meet the following criteria:  . Traveled to [place], [place], [place], [place] or [place]; or in the [place] to [place], [place], [place], or [place]; and have fever, cough, and shortness of breath within the last 2 weeks of travel OR . Been in close contact with a person diagnosed with COVID-19 within the last 2 weeks and have fever, cough, and shortness of breath . IF YOU DO NOT MEET THESE CRITERIA, YOU ARE CONSIDERED LOW RISK FOR COVID-19.  What to do if you are HIGH RISK for COVID-19?  Marland Kitchen If you are having a medical emergency, call 911. . Seek medical care right away. Before you go to a doctor's office, urgent care or emergency department, call ahead and tell them  about your recent travel, contact with someone diagnosed with COVID-19, and your symptoms. You should receive instructions from your physician's office regarding next steps of care.  . When you arrive at healthcare provider, tell the healthcare staff immediately you have returned from visiting [place], [place], [place], [place] or [place]; or traveled in the [place] to [place], [place], [place], or [place]; in the last two weeks or you have been in close contact with a person diagnosed with COVID-19 in the last 2 weeks.   . Tell the health care staff about your symptoms: fever, cough and shortness of breath. . After you have been seen by a medical provider, you will be either: o Tested for (COVID-19) and discharged home on quarantine except to seek medical care if symptoms worsen, and asked to  - Stay home and avoid contact with others until you get your results (4-5 days)  - Avoid travel on public transportation if possible (such as bus, train, or airplane) or o Sent to the Emergency Department by EMS for evaluation, COVID-19 testing, and possible admission depending on your condition and test results.  What to do if you are LOW RISK for COVID-19?  Reduce your risk of any infection by using the same precautions used for avoiding the common cold or flu:  Marland Kitchen Wash your hands often with soap and warm water for at least 20 seconds.  If soap and water are not readily available, use an alcohol-based hand sanitizer with at least 60% alcohol.  . If coughing  or sneezing, cover your mouth and nose by coughing or sneezing into the elbow areas of your shirt or coat, into a tissue or into your sleeve (not your hands). . Avoid shaking hands with others and consider head nods or verbal greetings only. . Avoid touching your eyes, nose, or mouth with unwashed hands.  . Avoid close contact with people who are sick. . Avoid places or events with large numbers of people in one location, like concerts or  sporting events. . Carefully consider travel plans you have or are making. . If you are planning any travel outside or inside the US, visit the CDC's Travelers' Health webpage for the latest health notices. . If you have some symptoms but not all symptoms, continue to monitor at home and seek medical attention if your symptoms worsen. . If you are having a medical emergency, call 911.   ADDITIONAL HEALTHCARE OPTIONS FOR PATIENTS  [facility] / e-Visit: [email or web address]         [facility]: [phone]  [facility]: [phone]                   [facility]: [phone]   

## 2018-07-08 NOTE — Progress Notes (Signed)
Per Dr. Julien Nordmann, okay for patient to receive treatment today for platelets of 98.

## 2018-07-15 ENCOUNTER — Encounter: Payer: Self-pay | Admitting: Internal Medicine

## 2018-07-15 ENCOUNTER — Inpatient Hospital Stay: Payer: Self-pay

## 2018-07-15 ENCOUNTER — Inpatient Hospital Stay (HOSPITAL_BASED_OUTPATIENT_CLINIC_OR_DEPARTMENT_OTHER): Payer: Self-pay | Admitting: Internal Medicine

## 2018-07-15 ENCOUNTER — Other Ambulatory Visit: Payer: Self-pay

## 2018-07-15 DIAGNOSIS — G629 Polyneuropathy, unspecified: Secondary | ICD-10-CM

## 2018-07-15 DIAGNOSIS — C9000 Multiple myeloma not having achieved remission: Secondary | ICD-10-CM

## 2018-07-15 DIAGNOSIS — Z79899 Other long term (current) drug therapy: Secondary | ICD-10-CM

## 2018-07-15 DIAGNOSIS — K219 Gastro-esophageal reflux disease without esophagitis: Secondary | ICD-10-CM

## 2018-07-15 DIAGNOSIS — R21 Rash and other nonspecific skin eruption: Secondary | ICD-10-CM

## 2018-07-15 LAB — CMP (CANCER CENTER ONLY)
ALT: 13 U/L (ref 0–44)
AST: 20 U/L (ref 15–41)
Albumin: 3.6 g/dL (ref 3.5–5.0)
Alkaline Phosphatase: 77 U/L (ref 38–126)
Anion gap: 10 (ref 5–15)
BUN: 13 mg/dL (ref 8–23)
CO2: 26 mmol/L (ref 22–32)
Calcium: 9.2 mg/dL (ref 8.9–10.3)
Chloride: 102 mmol/L (ref 98–111)
Creatinine: 1.12 mg/dL — ABNORMAL HIGH (ref 0.44–1.00)
GFR, Est AFR Am: 58 mL/min — ABNORMAL LOW (ref >60)
GFR, Estimated: 50 mL/min — ABNORMAL LOW (ref >60)
Glucose, Bld: 83 mg/dL (ref 70–99)
Potassium: 4.1 mmol/L (ref 3.5–5.1)
Sodium: 138 mmol/L (ref 135–145)
Total Bilirubin: 0.4 mg/dL (ref 0.3–1.2)
Total Protein: 7.3 g/dL (ref 6.5–8.1)

## 2018-07-15 LAB — CBC WITH DIFFERENTIAL (CANCER CENTER ONLY)
Abs Immature Granulocytes: 0.01 10*3/uL (ref 0.00–0.07)
Basophils Absolute: 0 10*3/uL (ref 0.0–0.1)
Basophils Relative: 0 %
Eosinophils Absolute: 0.1 10*3/uL (ref 0.0–0.5)
Eosinophils Relative: 2 %
HCT: 39.3 % (ref 36.0–46.0)
Hemoglobin: 13.3 g/dL (ref 12.0–15.0)
Immature Granulocytes: 0 %
Lymphocytes Relative: 29 %
Lymphs Abs: 1.1 10*3/uL (ref 0.7–4.0)
MCH: 31.1 pg (ref 26.0–34.0)
MCHC: 33.8 g/dL (ref 30.0–36.0)
MCV: 92 fL (ref 80.0–100.0)
Monocytes Absolute: 0.4 10*3/uL (ref 0.1–1.0)
Monocytes Relative: 9 %
Neutro Abs: 2.3 10*3/uL (ref 1.7–7.7)
Neutrophils Relative %: 60 %
Platelet Count: 101 10*3/uL — ABNORMAL LOW (ref 150–400)
RBC: 4.27 MIL/uL (ref 3.87–5.11)
RDW: 13.3 % (ref 11.5–15.5)
WBC Count: 3.8 10*3/uL — ABNORMAL LOW (ref 4.0–10.5)
nRBC: 0 % (ref 0.0–0.2)

## 2018-07-15 MED ORDER — BORTEZOMIB CHEMO SQ INJECTION 3.5 MG (2.5MG/ML)
1.3000 mg/m2 | Freq: Once | INTRAMUSCULAR | Status: AC
  Administered 2018-07-15: 11:00:00 2.75 mg via SUBCUTANEOUS
  Filled 2018-07-15: qty 1.1

## 2018-07-15 MED ORDER — OMEPRAZOLE 40 MG PO CPDR: 40.0000 mg | DELAYED_RELEASE_CAPSULE | Freq: Every day | ORAL | 0 refills | Status: DC

## 2018-07-15 MED ORDER — PROCHLORPERAZINE MALEATE 10 MG PO TABS: 10.0000 mg | ORAL_TABLET | Freq: Once | ORAL | Status: DC

## 2018-07-15 MED ORDER — ACYCLOVIR 200 MG PO CAPS: ORAL_CAPSULE | ORAL | 0 refills | Status: DC

## 2018-07-15 MED FILL — ACYCLOVIR 200 MG CAP: 200 | 30 days supply | Qty: 60 | Fill #0

## 2018-07-15 MED FILL — OMEPRAZOLE 40 MG CPDR: 40 | 30 days supply | Qty: 30 | Fill #0

## 2018-07-15 MED FILL — METOPROLOL TARTRATE 25 MG T: 25 | 30 days supply | Qty: 30 | Fill #1

## 2018-07-15 NOTE — Patient Instructions (Signed)
[facility] Discharge Instructions for Patients Receiving Chemotherapy  Today you received the following chemotherapy agents: Bortezomib (VELCADE).  To help prevent nausea and vomiting after your treatment, we encourage you to take your nausea medication as prescribed.   If you develop nausea and vomiting that is not controlled by your nausea medication, call the clinic.   BELOW ARE SYMPTOMS THAT SHOULD BE REPORTED IMMEDIATELY:  *FEVER GREATER THAN 100.5 F  *CHILLS WITH OR WITHOUT FEVER  NAUSEA AND VOMITING THAT IS NOT CONTROLLED WITH YOUR NAUSEA MEDICATION  *UNUSUAL SHORTNESS OF BREATH  *UNUSUAL BRUISING OR BLEEDING  TENDERNESS IN MOUTH AND THROAT WITH OR WITHOUT PRESENCE OF ULCERS  *URINARY PROBLEMS  *BOWEL PROBLEMS  UNUSUAL RASH Items with * indicate a potential emergency and should be followed up as soon as possible.  Feel free to call the clinic should you have any questions or concerns. The clinic phone number is [phone].  Please show the [facility] at check-in to the Emergency Department and triage nurse.   Coronavirus (COVID-19) Are you at risk?  Are you at risk for the Coronavirus (COVID-19)?  To be considered HIGH RISK for Coronavirus (COVID-19), you have to meet the following criteria:  . Traveled to [place], [place], [place], [place] or [place]; or in the [place] to [place], [place], [place], or [place]; and have fever, cough, and shortness of breath within the last 2 weeks of travel OR . Been in close contact with a person diagnosed with COVID-19 within the last 2 weeks and have fever, cough, and shortness of breath . IF YOU DO NOT MEET THESE CRITERIA, YOU ARE CONSIDERED LOW RISK FOR COVID-19.  What to do if you are HIGH RISK for COVID-19?  . If you are having a medical emergency, call 911. . Seek medical care right away. Before you go to a doctor's office, urgent care or emergency department, call ahead and tell them  about your recent travel, contact with someone diagnosed with COVID-19, and your symptoms. You should receive instructions from your physician's office regarding next steps of care.  . When you arrive at healthcare provider, tell the healthcare staff immediately you have returned from visiting [place], [place], [place], [place] or [place]; or traveled in the [place] to [place], [place], [place], or [place]; in the last two weeks or you have been in close contact with a person diagnosed with COVID-19 in the last 2 weeks.   . Tell the health care staff about your symptoms: fever, cough and shortness of breath. . After you have been seen by a medical provider, you will be either: o Tested for (COVID-19) and discharged home on quarantine except to seek medical care if symptoms worsen, and asked to  - Stay home and avoid contact with others until you get your results (4-5 days)  - Avoid travel on public transportation if possible (such as bus, train, or airplane) or o Sent to the Emergency Department by EMS for evaluation, COVID-19 testing, and possible admission depending on your condition and test results.  What to do if you are LOW RISK for COVID-19?  Reduce your risk of any infection by using the same precautions used for avoiding the common cold or flu:  . Wash your hands often with soap and warm water for at least 20 seconds.  If soap and water are not readily available, use an alcohol-based hand sanitizer with at least 60% alcohol.  . If coughing   or sneezing, cover your mouth and nose by coughing or sneezing into the elbow areas of your shirt or coat, into a tissue or into your sleeve (not your hands). . Avoid shaking hands with others and consider head nods or verbal greetings only. . Avoid touching your eyes, nose, or mouth with unwashed hands.  . Avoid close contact with people who are sick. . Avoid places or events with large numbers of people in one location, like concerts or  sporting events. . Carefully consider travel plans you have or are making. . If you are planning any travel outside or inside the US, visit the CDC's Travelers' Health webpage for the latest health notices. . If you have some symptoms but not all symptoms, continue to monitor at home and seek medical attention if your symptoms worsen. . If you are having a medical emergency, call 911.   ADDITIONAL HEALTHCARE OPTIONS FOR PATIENTS  [facility] / e-Visit: [email or web address]         [facility]: [phone]  [facility]: [phone]                   [facility]: [phone]   

## 2018-07-15 NOTE — Progress Notes (Signed)
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  PRIOR THERAPY: None  CURRENT THERAPY: Systemic therapy with Velcade 1.3 mg/M2 weekly, Revlimid 25 mg p.o. daily for 14 days every 3 weeks in addition to weekly Decadron 40 mg orally. First dose of treatment 10/08/2017.Treatment was placed on hold due to development of a significant rash.Sheresumedtreatment with dexamethasone and Velcade on 11/19/2017. She will not resume treatment of Revlimid due to the rash.  Status post 8 months of treatment.  INTERVAL HISTORY: Debbie Chan 68 y.o. female returns to the clinic today for follow-up visit.  The patient is feeling fine today with no concerning complaints.  She had left lower quadrant abdominal pain a week ago and she discontinued her treatment with gabapentin.  She is feeling much better now.  She denied having any associated nausea, vomiting, diarrhea or constipation.  She has no fever or chills.  She has no chest pain, shortness of breath, cough or hemoptysis.  She continues to tolerate her treatment with Velcade and Decadron.   MEDICAL HISTORY:No past medical history on file.  ALLERGIES:  has No Known Allergies.  MEDICATIONS:  Current Outpatient Medications  Medication Sig Dispense Refill  . acyclovir (ZOVIRAX) 200 MG capsule TAKE 1 CAPSULE BY MOUTH 2 TIMES DAILY. 60 capsule 0  . blood glucose meter kit and supplies Dispense based on patient and insurance preference. Use up to four times daily as directed. (FOR ICD-10 E10.9, E11.9). (Patient not taking: Reported on 11/26/2017) 1 each 0  . dexamethasone (DECADRON) 4 MG tablet Take 10 tablets (40 mg total) by mouth every 7 (seven) days. 40 tablet 3  . diphenhydrAMINE (BENADRYL ALLERGY) 25 MG tablet Take 1 tablet (25 mg total) by mouth every 6 (six) hours as  needed for itching. (Patient not taking: Reported on 11/26/2017) 100 tablet 0  . furosemide (LASIX) 20 MG tablet Take once daily for 3 days for lower extremity edema, then stop. Repeat as needed for recurrent edema. (Patient not taking: Reported on 12/24/2017) 30 tablet 2  . gabapentin (NEURONTIN) 100 MG capsule Take 1 capsule (100 mg total) by mouth 3 (three) times daily. 90 capsule 1  . metoprolol tartrate (LOPRESSOR) 25 MG tablet Take 0.5 tablets (12.5 mg total) by mouth 2 (two) times daily. 30 tablet 0  . omeprazole (PRILOSEC) 40 MG capsule TAKE 1 CAPSULE BY MOUTH ONCE A DAY 30 capsule 0  . oxyCODONE-acetaminophen (PERCOCET/ROXICET) 5-325 MG tablet Take 1 tablet by mouth every 8 (eight) hours as needed for severe pain. (Patient not taking: Reported on 06/24/2018) 30 tablet 0  . polyethylene glycol (MIRALAX / GLYCOLAX) packet Take 17 g by mouth daily. (Patient not taking: Reported on 11/26/2017) 14 each 0  . prochlorperazine (COMPAZINE) 10 MG tablet Take 1 tablet (10 mg total) by mouth every 6 (six) hours as needed for nausea or vomiting. 30 tablet 0  . RELION GLUCOSE TEST STRIPS test strip USE 1 TO CHECK GLUCOSE ONCE DAILY    . ReliOn Ultra Thin Lancets 30G MISC USE 1 TO CHECK GLUCOSE ONCE DAILY FOR GLUCOSE TESTING    . traZODone (DESYREL) 50 MG tablet Take 1 tablet (50 mg total) by mouth at bedtime. (Patient not taking: Reported on 12/24/2017) 30 tablet 0   No current facility-administered medications for this visit.     SURGICAL HISTORY: No past surgical  history on file.  REVIEW OF SYSTEMS:  A comprehensive review of systems was negative except for: Constitutional: positive for fatigue Neurological: positive for paresthesia   PHYSICAL EXAMINATION: General appearance: alert, cooperative, fatigued and no distress Head: Normocephalic, without obvious abnormality, atraumatic Neck: no adenopathy, no JVD, supple, symmetrical, trachea midline and thyroid not enlarged, symmetric, no  tenderness/mass/nodules Lymph nodes: Cervical, supraclavicular, and axillary nodes normal. Resp: clear to auscultation bilaterally Back: symmetric, no curvature. ROM normal. No CVA tenderness. Cardio: regular rate and rhythm, S1, S2 normal, no murmur, click, rub or gallop GI: soft, non-tender; bowel sounds normal; no masses,  no organomegaly Extremities: extremities normal, atraumatic, no cyanosis or edema  ECOG PERFORMANCE STATUS: 1 - Symptomatic but completely ambulatory  Blood pressure (!) 150/75, pulse 76, temperature 98.6 F (37 C), temperature source Oral, resp. rate 18, height '5\' 8"'  (1.727 m), weight 195 lb 6.4 oz (88.6 kg), SpO2 100 %.  LABORATORY DATA: Lab Results  Component Value Date   WBC 3.6 (L) 07/08/2018   HGB 12.9 07/08/2018   HCT 38.6 07/08/2018   MCV 92.3 07/08/2018   PLT 98 (L) 07/08/2018      Chemistry      Component Value Date/Time   NA 139 07/08/2018 0907   K 4.0 07/08/2018 0907   CL 103 07/08/2018 0907   CO2 26 07/08/2018 0907   BUN 13 07/08/2018 0907   CREATININE 1.18 (H) 07/08/2018 0907      Component Value Date/Time   CALCIUM 8.8 (L) 07/08/2018 0907   ALKPHOS 73 07/08/2018 0907   AST 20 07/08/2018 0907   ALT 13 07/08/2018 0907   BILITOT 0.4 07/08/2018 0907       RADIOGRAPHIC STUDIES: No results found.  ASSESSMENT AND PLAN: This is a very pleasant 68 years old African female originally from [place] who is visiting her son in [place] and was recently diagnosed with IgA multiple myeloma. The patient was a started initially on treatment with weekly subcutaneous Velcade 1.3 mg/M2, Revlimid 25 mg p.o. daily for 21 days every 4 weeks as well as weekly Decadron 40 mg orally.  She was tolerating the treatment well but she developed significant skin rash secondary to treatment with Revlimid and this was discontinued. The patient continued treatment with subcutaneous Velcade and Decadron. She has been tolerating this treatment well  with no concerning adverse effects except for mild peripheral neuropathy.  She is stopped her gabapentin because of abdominal pain. I recommended for the patient to continue her current treatment with Velcade and Decadron. I will see her back for follow-up visit in 4 weeks after repeating myeloma panel. The patient was advised to call immediately if she has any concerning symptoms in the interval. The patient voices understanding of current disease status and treatment options and is in agreement with the current care plan. All questions were answered. The patient knows to call the clinic with any problems, questions or concerns. We can certainly see the patient much sooner if necessary.  Disclaimer: This note was dictated with voice recognition software. Similar sounding words can inadvertently be transcribed and may not be corrected upon review.

## 2018-07-16 ENCOUNTER — Telehealth: Payer: Self-pay | Admitting: Internal Medicine

## 2018-07-16 NOTE — Telephone Encounter (Signed)
Scheduled appt per 5/21 los - pt to get an updated schedule next visit.  

## 2018-07-22 ENCOUNTER — Inpatient Hospital Stay: Payer: Self-pay

## 2018-07-22 ENCOUNTER — Other Ambulatory Visit: Payer: Self-pay

## 2018-07-22 VITALS — BP 135/71 | HR 77 | Temp 98.1°F | Resp 17

## 2018-07-22 DIAGNOSIS — C9000 Multiple myeloma not having achieved remission: Secondary | ICD-10-CM

## 2018-07-22 LAB — CBC WITH DIFFERENTIAL (CANCER CENTER ONLY)
Abs Immature Granulocytes: 0.02 10*3/uL (ref 0.00–0.07)
Basophils Absolute: 0 10*3/uL (ref 0.0–0.1)
Basophils Relative: 0 %
Eosinophils Absolute: 0.1 10*3/uL (ref 0.0–0.5)
Eosinophils Relative: 2 %
HCT: 38.5 % (ref 36.0–46.0)
Hemoglobin: 12.8 g/dL (ref 12.0–15.0)
Immature Granulocytes: 0 %
Lymphocytes Relative: 21 %
Lymphs Abs: 1 10*3/uL (ref 0.7–4.0)
MCH: 30.5 pg (ref 26.0–34.0)
MCHC: 33.2 g/dL (ref 30.0–36.0)
MCV: 91.7 fL (ref 80.0–100.0)
Monocytes Absolute: 0.3 10*3/uL (ref 0.1–1.0)
Monocytes Relative: 7 %
Neutro Abs: 3.4 10*3/uL (ref 1.7–7.7)
Neutrophils Relative %: 70 %
Platelet Count: 102 10*3/uL — ABNORMAL LOW (ref 150–400)
RBC: 4.2 MIL/uL (ref 3.87–5.11)
RDW: 13.2 % (ref 11.5–15.5)
WBC Count: 4.8 10*3/uL (ref 4.0–10.5)
nRBC: 0 % (ref 0.0–0.2)

## 2018-07-22 LAB — CMP (CANCER CENTER ONLY)
ALT: 14 U/L (ref 0–44)
AST: 18 U/L (ref 15–41)
Albumin: 3.5 g/dL (ref 3.5–5.0)
Alkaline Phosphatase: 67 U/L (ref 38–126)
Anion gap: 9 (ref 5–15)
BUN: 17 mg/dL (ref 8–23)
CO2: 25 mmol/L (ref 22–32)
Calcium: 8.9 mg/dL (ref 8.9–10.3)
Chloride: 105 mmol/L (ref 98–111)
Creatinine: 1.36 mg/dL — ABNORMAL HIGH (ref 0.44–1.00)
GFR, Est AFR Am: 46 mL/min — ABNORMAL LOW (ref >60)
GFR, Estimated: 40 mL/min — ABNORMAL LOW (ref >60)
Glucose, Bld: 79 mg/dL (ref 70–99)
Potassium: 4.4 mmol/L (ref 3.5–5.1)
Sodium: 139 mmol/L (ref 135–145)
Total Bilirubin: 0.4 mg/dL (ref 0.3–1.2)
Total Protein: 7.1 g/dL (ref 6.5–8.1)

## 2018-07-22 MED ORDER — PROCHLORPERAZINE MALEATE 10 MG PO TABS
ORAL_TABLET | ORAL | Status: AC
  Filled 2018-07-22: qty 1

## 2018-07-22 MED ORDER — PROCHLORPERAZINE MALEATE 10 MG PO TABS
10.0000 mg | ORAL_TABLET | Freq: Once | ORAL | Status: AC
  Administered 2018-07-22: 10:00:00 10 mg via ORAL

## 2018-07-22 MED ORDER — BORTEZOMIB CHEMO SQ INJECTION 3.5 MG (2.5MG/ML)
1.3000 mg/m2 | Freq: Once | INTRAMUSCULAR | Status: AC
  Administered 2018-07-22: 10:00:00 2.75 mg via SUBCUTANEOUS
  Filled 2018-07-22: qty 1.1

## 2018-07-22 MED FILL — DEXAMETHASONE 4 MG TABLET: 4 | 28 days supply | Qty: 40 | Fill #1

## 2018-07-22 NOTE — Patient Instructions (Signed)
[facility] Discharge Instructions for Patients Receiving Chemotherapy  Today you received the following chemotherapy agents Bortezomib (VELCADE).  To help prevent nausea and vomiting after your treatment, we encourage you to take your nausea medication as prescribed.   If you develop nausea and vomiting that is not controlled by your nausea medication, call the clinic.   BELOW ARE SYMPTOMS THAT SHOULD BE REPORTED IMMEDIATELY:  *FEVER GREATER THAN 100.5 F  *CHILLS WITH OR WITHOUT FEVER  NAUSEA AND VOMITING THAT IS NOT CONTROLLED WITH YOUR NAUSEA MEDICATION  *UNUSUAL SHORTNESS OF BREATH  *UNUSUAL BRUISING OR BLEEDING  TENDERNESS IN MOUTH AND THROAT WITH OR WITHOUT PRESENCE OF ULCERS  *URINARY PROBLEMS  *BOWEL PROBLEMS  UNUSUAL RASH Items with * indicate a potential emergency and should be followed up as soon as possible.  Feel free to call the clinic should you have any questions or concerns. The clinic phone number is [phone].  Please show the CHEMO ALERT CARD at check-in to the Emergency Department and triage nurse.   

## 2018-07-29 ENCOUNTER — Other Ambulatory Visit: Payer: Self-pay

## 2018-07-29 ENCOUNTER — Inpatient Hospital Stay: Payer: Self-pay

## 2018-07-29 ENCOUNTER — Inpatient Hospital Stay: Payer: Self-pay | Attending: Internal Medicine

## 2018-07-29 VITALS — BP 135/84 | HR 77 | Temp 98.4°F | Resp 18

## 2018-07-29 DIAGNOSIS — C9000 Multiple myeloma not having achieved remission: Secondary | ICD-10-CM | POA: Insufficient documentation

## 2018-07-29 DIAGNOSIS — Z79899 Other long term (current) drug therapy: Secondary | ICD-10-CM | POA: Insufficient documentation

## 2018-07-29 DIAGNOSIS — Z5111 Encounter for antineoplastic chemotherapy: Secondary | ICD-10-CM | POA: Insufficient documentation

## 2018-07-29 DIAGNOSIS — R21 Rash and other nonspecific skin eruption: Secondary | ICD-10-CM | POA: Insufficient documentation

## 2018-07-29 LAB — CMP (CANCER CENTER ONLY)
ALT: 11 U/L (ref 0–44)
AST: 19 U/L (ref 15–41)
Albumin: 3.6 g/dL (ref 3.5–5.0)
Alkaline Phosphatase: 65 U/L (ref 38–126)
Anion gap: 10 (ref 5–15)
BUN: 13 mg/dL (ref 8–23)
CO2: 26 mmol/L (ref 22–32)
Calcium: 9.3 mg/dL (ref 8.9–10.3)
Chloride: 102 mmol/L (ref 98–111)
Creatinine: 1.17 mg/dL — ABNORMAL HIGH (ref 0.44–1.00)
GFR, Est AFR Am: 55 mL/min — ABNORMAL LOW (ref >60)
GFR, Estimated: 48 mL/min — ABNORMAL LOW (ref >60)
Glucose, Bld: 75 mg/dL (ref 70–99)
Potassium: 4.5 mmol/L (ref 3.5–5.1)
Sodium: 138 mmol/L (ref 135–145)
Total Bilirubin: 0.5 mg/dL (ref 0.3–1.2)
Total Protein: 7.2 g/dL (ref 6.5–8.1)

## 2018-07-29 LAB — CBC WITH DIFFERENTIAL (CANCER CENTER ONLY)
Abs Immature Granulocytes: 0.01 10*3/uL (ref 0.00–0.07)
Basophils Absolute: 0 10*3/uL (ref 0.0–0.1)
Basophils Relative: 0 %
Eosinophils Absolute: 0.1 10*3/uL (ref 0.0–0.5)
Eosinophils Relative: 1 %
HCT: 38.5 % (ref 36.0–46.0)
Hemoglobin: 13 g/dL (ref 12.0–15.0)
Immature Granulocytes: 0 %
Lymphocytes Relative: 19 %
Lymphs Abs: 0.9 10*3/uL (ref 0.7–4.0)
MCH: 31.3 pg (ref 26.0–34.0)
MCHC: 33.8 g/dL (ref 30.0–36.0)
MCV: 92.5 fL (ref 80.0–100.0)
Monocytes Absolute: 0.3 10*3/uL (ref 0.1–1.0)
Monocytes Relative: 7 %
Neutro Abs: 3.3 10*3/uL (ref 1.7–7.7)
Neutrophils Relative %: 73 %
Platelet Count: 94 10*3/uL — ABNORMAL LOW (ref 150–400)
RBC: 4.16 MIL/uL (ref 3.87–5.11)
RDW: 13.2 % (ref 11.5–15.5)
WBC Count: 4.6 10*3/uL (ref 4.0–10.5)
nRBC: 0 % (ref 0.0–0.2)

## 2018-07-29 MED ORDER — PROCHLORPERAZINE MALEATE 10 MG PO TABS: 10.0000 mg | ORAL_TABLET | Freq: Once | ORAL | Status: DC

## 2018-07-29 MED ORDER — BORTEZOMIB CHEMO SQ INJECTION 3.5 MG (2.5MG/ML)
1.3000 mg/m2 | Freq: Once | INTRAMUSCULAR | Status: AC
  Administered 2018-07-29: 2.75 mg via SUBCUTANEOUS
  Filled 2018-07-29: qty 1.1

## 2018-07-29 NOTE — Patient Instructions (Signed)
Coronavirus (COVID-19) Are you at risk?  Are you at risk for the Coronavirus (COVID-19)?  To be considered HIGH RISK for Coronavirus (COVID-19), you have to meet the following criteria:  . Traveled to [place], [place], [place], [place] or [place]; or in the [place] to [place], [place], [place], or [place]; and have fever, cough, and shortness of breath within the last 2 weeks of travel OR . Been in close contact with a person diagnosed with COVID-19 within the last 2 weeks and have fever, cough, and shortness of breath . IF YOU DO NOT MEET THESE CRITERIA, YOU ARE CONSIDERED LOW RISK FOR COVID-19.  What to do if you are HIGH RISK for COVID-19?  . If you are having a medical emergency, call 911. . Seek medical care right away. Before you go to a doctor's office, urgent care or emergency department, call ahead and tell them about your recent travel, contact with someone diagnosed with COVID-19, and your symptoms. You should receive instructions from your physician's office regarding next steps of care.  . When you arrive at healthcare provider, tell the healthcare staff immediately you have returned from visiting [place], [place], [place], [place] or [place]; or traveled in the [place] to [place], [place], [place], or [place]; in the last two weeks or you have been in close contact with a person diagnosed with COVID-19 in the last 2 weeks.   . Tell the health care staff about your symptoms: fever, cough and shortness of breath. . After you have been seen by a medical provider, you will be either: o Tested for (COVID-19) and discharged home on quarantine except to seek medical care if symptoms worsen, and asked to  - Stay home and avoid contact with others until you get your results (4-5 days)  - Avoid travel on public transportation if possible (such as bus, train, or airplane) or o Sent to the Emergency Department by EMS for evaluation, COVID-19 testing, and possible  admission depending on your condition and test results.  What to do if you are LOW RISK for COVID-19?  Reduce your risk of any infection by using the same precautions used for avoiding the common cold or flu:  . Wash your hands often with soap and warm water for at least 20 seconds.  If soap and water are not readily available, use an alcohol-based hand sanitizer with at least 60% alcohol.  . If coughing or sneezing, cover your mouth and nose by coughing or sneezing into the elbow areas of your shirt or coat, into a tissue or into your sleeve (not your hands). . Avoid shaking hands with others and consider head nods or verbal greetings only. . Avoid touching your eyes, nose, or mouth with unwashed hands.  . Avoid close contact with people who are sick. . Avoid places or events with large numbers of people in one location, like concerts or sporting events. . Carefully consider travel plans you have or are making. . If you are planning any travel outside or inside the [place], visit the CDC's Travelers' Health webpage for the latest health notices. . If you have some symptoms but not all symptoms, continue to monitor at home and seek medical attention if your symptoms worsen. . If you are having a medical emergency, call 911.   ADDITIONAL HEALTHCARE OPTIONS FOR PATIENTS  [facility] / e-Visit: [email or web address]         [facility]: [phone]  [facility]: [phone]                   [facility]: [phone]    [facility] Discharge Instructions for Patients Receiving Chemotherapy  Today you received the following chemotherapy agents Velcade  To help prevent nausea and vomiting after your treatment, we encourage you to take your nausea medication as directed   If you develop nausea and vomiting that is not controlled by your nausea medication, call the clinic.   BELOW ARE  SYMPTOMS THAT SHOULD BE REPORTED IMMEDIATELY:  *FEVER GREATER THAN 100.5 F  *CHILLS WITH OR WITHOUT FEVER  NAUSEA AND VOMITING THAT IS NOT CONTROLLED WITH YOUR NAUSEA MEDICATION  *UNUSUAL SHORTNESS OF BREATH  *UNUSUAL BRUISING OR BLEEDING  TENDERNESS IN MOUTH AND THROAT WITH OR WITHOUT PRESENCE OF ULCERS  *URINARY PROBLEMS  *BOWEL PROBLEMS  UNUSUAL RASH Items with * indicate a potential emergency and should be followed up as soon as possible.  Feel free to call the clinic should you have any questions or concerns. The clinic phone number is [phone].  Please show the CHEMO ALERT CARD at check-in to the Emergency Department and triage nurse.   

## 2018-07-29 NOTE — Progress Notes (Signed)
Ok to treat with lab results today per MD [facility]

## 2018-08-05 ENCOUNTER — Inpatient Hospital Stay (HOSPITAL_BASED_OUTPATIENT_CLINIC_OR_DEPARTMENT_OTHER): Payer: Self-pay | Admitting: Internal Medicine

## 2018-08-05 ENCOUNTER — Inpatient Hospital Stay: Payer: Self-pay

## 2018-08-05 ENCOUNTER — Encounter: Payer: Self-pay | Admitting: Internal Medicine

## 2018-08-05 ENCOUNTER — Other Ambulatory Visit: Payer: Self-pay

## 2018-08-05 VITALS — BP 144/80 | HR 79 | Temp 98.3°F | Resp 20 | Ht 68.0 in | Wt 194.9 lb

## 2018-08-05 DIAGNOSIS — Z79899 Other long term (current) drug therapy: Secondary | ICD-10-CM

## 2018-08-05 DIAGNOSIS — C9000 Multiple myeloma not having achieved remission: Secondary | ICD-10-CM

## 2018-08-05 DIAGNOSIS — Z5111 Encounter for antineoplastic chemotherapy: Secondary | ICD-10-CM

## 2018-08-05 DIAGNOSIS — R21 Rash and other nonspecific skin eruption: Secondary | ICD-10-CM

## 2018-08-05 LAB — CBC WITH DIFFERENTIAL (CANCER CENTER ONLY)
Abs Immature Granulocytes: 0.02 10*3/uL (ref 0.00–0.07)
Basophils Absolute: 0 10*3/uL (ref 0.0–0.1)
Basophils Relative: 0 %
Eosinophils Absolute: 0.1 10*3/uL (ref 0.0–0.5)
Eosinophils Relative: 1 %
HCT: 39.8 % (ref 36.0–46.0)
Hemoglobin: 13.4 g/dL (ref 12.0–15.0)
Immature Granulocytes: 0 %
Lymphocytes Relative: 18 %
Lymphs Abs: 0.9 10*3/uL (ref 0.7–4.0)
MCH: 31 pg (ref 26.0–34.0)
MCHC: 33.7 g/dL (ref 30.0–36.0)
MCV: 92.1 fL (ref 80.0–100.0)
Monocytes Absolute: 0.3 10*3/uL (ref 0.1–1.0)
Monocytes Relative: 6 %
Neutro Abs: 3.7 10*3/uL (ref 1.7–7.7)
Neutrophils Relative %: 75 %
Platelet Count: 104 10*3/uL — ABNORMAL LOW (ref 150–400)
RBC: 4.32 MIL/uL (ref 3.87–5.11)
RDW: 13.2 % (ref 11.5–15.5)
WBC Count: 5 10*3/uL (ref 4.0–10.5)
nRBC: 0 % (ref 0.0–0.2)

## 2018-08-05 LAB — CMP (CANCER CENTER ONLY)
ALT: 13 U/L (ref 0–44)
AST: 18 U/L (ref 15–41)
Albumin: 3.7 g/dL (ref 3.5–5.0)
Alkaline Phosphatase: 73 U/L (ref 38–126)
Anion gap: 12 (ref 5–15)
BUN: 16 mg/dL (ref 8–23)
CO2: 26 mmol/L (ref 22–32)
Calcium: 9.3 mg/dL (ref 8.9–10.3)
Chloride: 103 mmol/L (ref 98–111)
Creatinine: 1.2 mg/dL — ABNORMAL HIGH (ref 0.44–1.00)
GFR, Est AFR Am: 54 mL/min — ABNORMAL LOW (ref >60)
GFR, Estimated: 46 mL/min — ABNORMAL LOW (ref >60)
Glucose, Bld: 72 mg/dL (ref 70–99)
Potassium: 4.2 mmol/L (ref 3.5–5.1)
Sodium: 141 mmol/L (ref 135–145)
Total Bilirubin: 0.4 mg/dL (ref 0.3–1.2)
Total Protein: 7.4 g/dL (ref 6.5–8.1)

## 2018-08-05 LAB — LACTATE DEHYDROGENASE: LDH: 185 U/L (ref 98–192)

## 2018-08-05 MED ORDER — PROCHLORPERAZINE MALEATE 10 MG PO TABS: 10.0000 mg | ORAL_TABLET | Freq: Once | ORAL | Status: DC

## 2018-08-05 MED ORDER — BORTEZOMIB CHEMO SQ INJECTION 3.5 MG (2.5MG/ML)
1.3000 mg/m2 | Freq: Once | INTRAMUSCULAR | Status: AC
  Administered 2018-08-05: 2.75 mg via SUBCUTANEOUS
  Filled 2018-08-05: qty 1.1

## 2018-08-05 NOTE — Patient Instructions (Signed)
[facility] Discharge Instructions for Patients Receiving Chemotherapy  Today you received the following chemotherapy agents:  Velcade (bortezomib)  To help prevent nausea and vomiting after your treatment, we encourage you to take your nausea medication as prescribed.   If you develop nausea and vomiting that is not controlled by your nausea medication, call the clinic.   BELOW ARE SYMPTOMS THAT SHOULD BE REPORTED IMMEDIATELY:  *FEVER GREATER THAN 100.5 F  *CHILLS WITH OR WITHOUT FEVER  NAUSEA AND VOMITING THAT IS NOT CONTROLLED WITH YOUR NAUSEA MEDICATION  *UNUSUAL SHORTNESS OF BREATH  *UNUSUAL BRUISING OR BLEEDING  TENDERNESS IN MOUTH AND THROAT WITH OR WITHOUT PRESENCE OF ULCERS  *URINARY PROBLEMS  *BOWEL PROBLEMS  UNUSUAL RASH Items with * indicate a potential emergency and should be followed up as soon as possible.  Feel free to call the clinic should you have any questions or concerns. The clinic phone number is [phone].  Please show the CHEMO ALERT CARD at check-in to the Emergency Department and triage nurse.   

## 2018-08-05 NOTE — Progress Notes (Signed)
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  PRIOR THERAPY: None  CURRENT THERAPY: Systemic therapy with Velcade 1.3 mg/M2 weekly, Revlimid 25 mg p.o. daily for 14 days every 3 weeks in addition to weekly Decadron 40 mg orally. First dose of treatment 10/08/2017.Treatment was placed on hold due to development of a significant rash.Sheresumedtreatment with dexamethasone and Velcade on 11/19/2017. She will not resume treatment of Revlimid due to the rash.  Status post 9 months of treatment.  INTERVAL HISTORY: Debbie Chan 68 y.o. female returns to the clinic today for follow-up visit.  The patient continues to tolerate her treatment with subcutaneous Velcade and Decadron fairly well.  She denied having any chest pain, shortness of breath, cough or hemoptysis.  She has no fever or chills.  She denied having any nausea, vomiting, diarrhea or constipation.  She denied having any headache or visual changes.  She is here today for evaluation before starting cycle #10.  MEDICAL HISTORY:No past medical history on file.  ALLERGIES:  has No Known Allergies.  MEDICATIONS:  Current Outpatient Medications  Medication Sig Dispense Refill  . acyclovir (ZOVIRAX) 200 MG capsule TAKE 1 CAPSULE BY MOUTH 2 TIMES DAILY. 60 capsule 0  . blood glucose meter kit and supplies Dispense based on patient and insurance preference. Use up to four times daily as directed. (FOR ICD-10 E10.9, E11.9). 1 each 0  . dexamethasone (DECADRON) 4 MG tablet Take 10 tablets (40 mg total) by mouth every 7 (seven) days. 40 tablet 3  . diphenhydrAMINE (BENADRYL ALLERGY) 25 MG tablet Take 1 tablet (25 mg total) by mouth every 6 (six) hours as needed for itching. (Patient not taking: Reported on 11/26/2017) 100 tablet 0  . furosemide (LASIX) 20 MG  tablet Take once daily for 3 days for lower extremity edema, then stop. Repeat as needed for recurrent edema. (Patient not taking: Reported on 12/24/2017) 30 tablet 2  . gabapentin (NEURONTIN) 100 MG capsule Take 1 capsule (100 mg total) by mouth 3 (three) times daily. 90 capsule 1  . metoprolol tartrate (LOPRESSOR) 25 MG tablet Take 0.5 tablets (12.5 mg total) by mouth 2 (two) times daily. 30 tablet 0  . omeprazole (PRILOSEC) 40 MG capsule Take 1 capsule (40 mg total) by mouth daily. 30 capsule 0  . oxyCODONE-acetaminophen (PERCOCET/ROXICET) 5-325 MG tablet Take 1 tablet by mouth every 8 (eight) hours as needed for severe pain. (Patient not taking: Reported on 06/24/2018) 30 tablet 0  . polyethylene glycol (MIRALAX / GLYCOLAX) packet Take 17 g by mouth daily. (Patient not taking: Reported on 11/26/2017) 14 each 0  . prochlorperazine (COMPAZINE) 10 MG tablet Take 1 tablet (10 mg total) by mouth every 6 (six) hours as needed for nausea or vomiting. 30 tablet 0  . RELION GLUCOSE TEST STRIPS test strip USE 1 TO CHECK GLUCOSE ONCE DAILY    . ReliOn Ultra Thin Lancets 30G MISC USE 1 TO CHECK GLUCOSE ONCE DAILY FOR GLUCOSE TESTING    . traZODone (DESYREL) 50 MG tablet Take 1 tablet (50 mg total) by mouth at bedtime. (Patient not taking: Reported on 12/24/2017) 30 tablet 0   No current facility-administered medications for this visit.     SURGICAL HISTORY: No past surgical history on file.  REVIEW OF SYSTEMS:  A comprehensive review of systems was negative except for: Constitutional:  positive for fatigue Neurological: positive for paresthesia   PHYSICAL EXAMINATION: General appearance: alert, cooperative, fatigued and no distress Head: Normocephalic, without obvious abnormality, atraumatic Neck: no adenopathy, no JVD, supple, symmetrical, trachea midline and thyroid not enlarged, symmetric, no tenderness/mass/nodules Lymph nodes: Cervical, supraclavicular, and axillary nodes normal. Resp: clear to  auscultation bilaterally Back: symmetric, no curvature. ROM normal. No CVA tenderness. Cardio: regular rate and rhythm, S1, S2 normal, no murmur, click, rub or gallop GI: soft, non-tender; bowel sounds normal; no masses,  no organomegaly Extremities: extremities normal, atraumatic, no cyanosis or edema  ECOG PERFORMANCE STATUS: 1 - Symptomatic but completely ambulatory  Blood pressure (!) 144/80, pulse 79, temperature 98.3 F (36.8 C), temperature source Oral, resp. rate 20, height _0  (1.727 m), weight 194 lb 14.4 oz (88.4 kg), SpO2 100 %.  LABORATORY DATA: Lab Results  Component Value Date   WBC 5.0 08/05/2018   HGB 13.4 08/05/2018   HCT 39.8 08/05/2018   MCV 92.1 08/05/2018   PLT 104 (L) 08/05/2018      Chemistry      Component Value Date/Time   NA 138 07/29/2018 0903   K 4.5 07/29/2018 0903   CL 102 07/29/2018 0903   CO2 26 07/29/2018 0903   BUN 13 07/29/2018 0903   CREATININE 1.17 (H) 07/29/2018 0903      Component Value Date/Time   CALCIUM 9.3 07/29/2018 0903   ALKPHOS 65 07/29/2018 0903   AST 19 07/29/2018 0903   ALT 11 07/29/2018 0903   BILITOT 0.5 07/29/2018 0903       RADIOGRAPHIC STUDIES: No results found.  ASSESSMENT AND PLAN: This is a very pleasant 68 years old African female originally from [place] who is visiting her son in [place] and was recently diagnosed with IgA multiple myeloma. The patient was a started initially on treatment with weekly subcutaneous Velcade 1.3 mg/M2, Revlimid 25 mg p.o. daily for 21 days every 4 weeks as well as weekly Decadron 40 mg orally.  She was tolerating the treatment well but she developed significant skin rash secondary to treatment with Revlimid and this was discontinued. The patient continued treatment with subcutaneous Velcade and Decadron. The patient has been tolerating this treatment well with no concerning adverse effects. I recommended for her to continue her current treatment with Velcade  and Decadron. I will see her back for follow-up visit in 4 weeks for evaluation before the next cycle of her treatment. She was advised to call immediately if she has any concerning symptoms in the interval. The patient voices understanding of current disease status and treatment options and is in agreement with the current care plan. All questions were answered. The patient knows to call the clinic with any problems, questions or concerns. We can certainly see the patient much sooner if necessary.  Disclaimer: This note was dictated with voice recognition software. Similar sounding words can inadvertently be transcribed and may not be corrected upon review.

## 2018-08-06 ENCOUNTER — Telehealth: Payer: Self-pay | Admitting: Internal Medicine

## 2018-08-06 LAB — KAPPA/LAMBDA LIGHT CHAINS
Kappa free light chain: 8.3 mg/L (ref 3.3–19.4)
Kappa, lambda light chain ratio: 0.01 — ABNORMAL LOW (ref 0.26–1.65)
Lambda free light chains: 652.1 mg/L — ABNORMAL HIGH (ref 5.7–26.3)

## 2018-08-06 LAB — IGG, IGA, IGM
IgA: 1871 mg/dL — ABNORMAL HIGH (ref 87–352)
IgG (Immunoglobin G), Serum: 417 mg/dL — ABNORMAL LOW (ref 586–1602)
IgM (Immunoglobulin M), Srm: 9 mg/dL — ABNORMAL LOW (ref 26–217)

## 2018-08-06 LAB — BETA 2 MICROGLOBULIN, SERUM: Beta-2 Microglobulin: 2.7 mg/L — ABNORMAL HIGH (ref 0.6–2.4)

## 2018-08-06 NOTE — Telephone Encounter (Signed)
Per 6/11 schedule message patient requesting AM appointments due to transportation. Spoke with dtr and per dtr patient needs appointments on Thursday early morning.  Not able to move 6/18 appointments. Remaining appointments 6/25 thru 7/16 moved to AM. Dtr aware and patient will get updated schedule at 6/18 visit, which remains as scheduled.

## 2018-08-12 ENCOUNTER — Other Ambulatory Visit: Payer: Self-pay | Admitting: Internal Medicine

## 2018-08-12 ENCOUNTER — Other Ambulatory Visit: Payer: Self-pay

## 2018-08-12 ENCOUNTER — Ambulatory Visit: Payer: Self-pay | Admitting: Physician Assistant

## 2018-08-12 ENCOUNTER — Other Ambulatory Visit: Payer: Self-pay | Admitting: Medical Oncology

## 2018-08-12 ENCOUNTER — Inpatient Hospital Stay: Payer: Self-pay

## 2018-08-12 VITALS — BP 151/78 | HR 72 | Temp 98.2°F | Resp 18

## 2018-08-12 DIAGNOSIS — C9000 Multiple myeloma not having achieved remission: Secondary | ICD-10-CM

## 2018-08-12 DIAGNOSIS — K219 Gastro-esophageal reflux disease without esophagitis: Secondary | ICD-10-CM

## 2018-08-12 LAB — CMP (CANCER CENTER ONLY)
ALT: 14 U/L (ref 0–44)
AST: 20 U/L (ref 15–41)
Albumin: 3.7 g/dL (ref 3.5–5.0)
Alkaline Phosphatase: 78 U/L (ref 38–126)
Anion gap: 13 (ref 5–15)
BUN: 14 mg/dL (ref 8–23)
CO2: 21 mmol/L — ABNORMAL LOW (ref 22–32)
Calcium: 8.9 mg/dL (ref 8.9–10.3)
Chloride: 103 mmol/L (ref 98–111)
Creatinine: 1.31 mg/dL — ABNORMAL HIGH (ref 0.44–1.00)
GFR, Est AFR Am: 48 mL/min — ABNORMAL LOW (ref >60)
GFR, Estimated: 42 mL/min — ABNORMAL LOW (ref >60)
Glucose, Bld: 163 mg/dL — ABNORMAL HIGH (ref 70–99)
Potassium: 4.4 mmol/L (ref 3.5–5.1)
Sodium: 137 mmol/L (ref 135–145)
Total Bilirubin: 0.3 mg/dL (ref 0.3–1.2)
Total Protein: 7.4 g/dL (ref 6.5–8.1)

## 2018-08-12 LAB — CBC WITH DIFFERENTIAL (CANCER CENTER ONLY)
Abs Immature Granulocytes: 0.03 10*3/uL (ref 0.00–0.07)
Basophils Absolute: 0 10*3/uL (ref 0.0–0.1)
Basophils Relative: 0 %
Eosinophils Absolute: 0 10*3/uL (ref 0.0–0.5)
Eosinophils Relative: 0 %
HCT: 37.5 % (ref 36.0–46.0)
Hemoglobin: 12.8 g/dL (ref 12.0–15.0)
Immature Granulocytes: 1 %
Lymphocytes Relative: 12 %
Lymphs Abs: 0.6 10*3/uL — ABNORMAL LOW (ref 0.7–4.0)
MCH: 31.2 pg (ref 26.0–34.0)
MCHC: 34.1 g/dL (ref 30.0–36.0)
MCV: 91.5 fL (ref 80.0–100.0)
Monocytes Absolute: 0.1 10*3/uL (ref 0.1–1.0)
Monocytes Relative: 1 %
Neutro Abs: 4.2 10*3/uL (ref 1.7–7.7)
Neutrophils Relative %: 86 %
Platelet Count: 99 10*3/uL — ABNORMAL LOW (ref 150–400)
RBC: 4.1 MIL/uL (ref 3.87–5.11)
RDW: 13.2 % (ref 11.5–15.5)
WBC Count: 4.9 10*3/uL (ref 4.0–10.5)
nRBC: 0 % (ref 0.0–0.2)

## 2018-08-12 MED ORDER — BORTEZOMIB CHEMO SQ INJECTION 3.5 MG (2.5MG/ML)
1.3000 mg/m2 | Freq: Once | INTRAMUSCULAR | Status: AC
  Administered 2018-08-12: 2.75 mg via SUBCUTANEOUS
  Filled 2018-08-12: qty 1.1

## 2018-08-12 MED ORDER — PROCHLORPERAZINE MALEATE 10 MG PO TABS
10.0000 mg | ORAL_TABLET | Freq: Once | ORAL | Status: AC
  Administered 2018-08-12: 10 mg via ORAL

## 2018-08-12 MED ORDER — PROCHLORPERAZINE MALEATE 10 MG PO TABS
ORAL_TABLET | ORAL | Status: AC
  Filled 2018-08-12: qty 1

## 2018-08-12 MED FILL — OMEPRAZOLE 40 MG CPDR: 40 | 30 days supply | Qty: 30 | Fill #0

## 2018-08-12 MED FILL — METOPROLOL TARTRATE 25 MG T: 25 | 30 days supply | Qty: 30 | Fill #2

## 2018-08-12 NOTE — Patient Instructions (Signed)
[facility] Discharge Instructions for Patients Receiving Chemotherapy  Today you received the following chemotherapy agents Bortezomib (VELCADE).  To help prevent nausea and vomiting after your treatment, we encourage you to take your nausea medication as prescribed.   If you develop nausea and vomiting that is not controlled by your nausea medication, call the clinic.   BELOW ARE SYMPTOMS THAT SHOULD BE REPORTED IMMEDIATELY:  *FEVER GREATER THAN 100.5 F  *CHILLS WITH OR WITHOUT FEVER  NAUSEA AND VOMITING THAT IS NOT CONTROLLED WITH YOUR NAUSEA MEDICATION  *UNUSUAL SHORTNESS OF BREATH  *UNUSUAL BRUISING OR BLEEDING  TENDERNESS IN MOUTH AND THROAT WITH OR WITHOUT PRESENCE OF ULCERS  *URINARY PROBLEMS  *BOWEL PROBLEMS  UNUSUAL RASH Items with * indicate a potential emergency and should be followed up as soon as possible.  Feel free to call the clinic should you have any questions or concerns. The clinic phone number is [phone].  Please show the CHEMO ALERT CARD at check-in to the Emergency Department and triage nurse.   

## 2018-08-12 NOTE — Progress Notes (Signed)
Pt's PLT count 99 today, Dr. Julien Nordmann informed, states it is ok to proceed with Velcade today.  Pt also C/O pain & swelling bilaterally in her feet, also pain in lower back - MD states this has been a chronic problem & that pt has gabapentin at home, she may resume taking this for her pain.  Pt informed, verbalizes understanding.

## 2018-08-19 ENCOUNTER — Inpatient Hospital Stay: Payer: Self-pay

## 2018-08-19 ENCOUNTER — Other Ambulatory Visit: Payer: Self-pay

## 2018-08-19 ENCOUNTER — Other Ambulatory Visit: Payer: Self-pay | Admitting: Medical Oncology

## 2018-08-19 VITALS — BP 137/75 | HR 73 | Temp 98.3°F | Resp 16

## 2018-08-19 DIAGNOSIS — C9000 Multiple myeloma not having achieved remission: Secondary | ICD-10-CM

## 2018-08-19 LAB — CBC WITH DIFFERENTIAL (CANCER CENTER ONLY)
Abs Immature Granulocytes: 0.01 10*3/uL (ref 0.00–0.07)
Basophils Absolute: 0 10*3/uL (ref 0.0–0.1)
Basophils Relative: 0 %
Eosinophils Absolute: 0.2 10*3/uL (ref 0.0–0.5)
Eosinophils Relative: 5 %
HCT: 38.1 % (ref 36.0–46.0)
Hemoglobin: 13.1 g/dL (ref 12.0–15.0)
Immature Granulocytes: 0 %
Lymphocytes Relative: 24 %
Lymphs Abs: 1.1 10*3/uL (ref 0.7–4.0)
MCH: 31.1 pg (ref 26.0–34.0)
MCHC: 34.4 g/dL (ref 30.0–36.0)
MCV: 90.5 fL (ref 80.0–100.0)
Monocytes Absolute: 0.4 10*3/uL (ref 0.1–1.0)
Monocytes Relative: 10 %
Neutro Abs: 2.7 10*3/uL (ref 1.7–7.7)
Neutrophils Relative %: 61 %
Platelet Count: 86 10*3/uL — ABNORMAL LOW (ref 150–400)
RBC: 4.21 MIL/uL (ref 3.87–5.11)
RDW: 13.2 % (ref 11.5–15.5)
WBC Count: 4.5 10*3/uL (ref 4.0–10.5)
nRBC: 0 % (ref 0.0–0.2)

## 2018-08-19 LAB — CMP (CANCER CENTER ONLY)
ALT: 14 U/L (ref 0–44)
AST: 20 U/L (ref 15–41)
Albumin: 3.5 g/dL (ref 3.5–5.0)
Alkaline Phosphatase: 72 U/L (ref 38–126)
Anion gap: 13 (ref 5–15)
BUN: 18 mg/dL (ref 8–23)
CO2: 21 mmol/L — ABNORMAL LOW (ref 22–32)
Calcium: 8.9 mg/dL (ref 8.9–10.3)
Chloride: 105 mmol/L (ref 98–111)
Creatinine: 1.2 mg/dL — ABNORMAL HIGH (ref 0.44–1.00)
GFR, Est AFR Am: 54 mL/min — ABNORMAL LOW (ref >60)
GFR, Estimated: 46 mL/min — ABNORMAL LOW (ref >60)
Glucose, Bld: 85 mg/dL (ref 70–99)
Potassium: 4.1 mmol/L (ref 3.5–5.1)
Sodium: 139 mmol/L (ref 135–145)
Total Bilirubin: 0.3 mg/dL (ref 0.3–1.2)
Total Protein: 7.2 g/dL (ref 6.5–8.1)

## 2018-08-19 MED ORDER — DEXAMETHASONE 4 MG PO TABS: 40.0000 mg | ORAL_TABLET | ORAL | 3 refills | Status: DC

## 2018-08-19 MED ORDER — ACYCLOVIR 200 MG PO CAPS: ORAL_CAPSULE | ORAL | 2 refills | Status: DC

## 2018-08-19 MED ORDER — PROCHLORPERAZINE MALEATE 10 MG PO TABS
10.0000 mg | ORAL_TABLET | Freq: Once | ORAL | Status: AC
  Administered 2018-08-19: 10 mg via ORAL

## 2018-08-19 MED ORDER — PROCHLORPERAZINE MALEATE 10 MG PO TABS
ORAL_TABLET | ORAL | Status: AC
  Filled 2018-08-19: qty 1

## 2018-08-19 MED ORDER — BORTEZOMIB CHEMO SQ INJECTION 3.5 MG (2.5MG/ML)
1.3000 mg/m2 | Freq: Once | INTRAMUSCULAR | Status: AC
  Administered 2018-08-19: 2.75 mg via SUBCUTANEOUS
  Filled 2018-08-19: qty 1.1

## 2018-08-19 MED FILL — ACYCLOVIR 200 MG CAP: 200 | 30 days supply | Qty: 60 | Fill #0

## 2018-08-19 MED FILL — DEXAMETHASONE 4 MG TABLET: 4 | 70 days supply | Qty: 40 | Fill #0

## 2018-08-19 NOTE — Patient Instructions (Signed)
Coronavirus (COVID-19) Are you at risk?  Are you at risk for the Coronavirus (COVID-19)?  To be considered HIGH RISK for Coronavirus (COVID-19), you have to meet the following criteria:  . Traveled to [place], [place], [place], [place] or [place]; or in the [place] to [place], [place], [place], or [place]; and have fever, cough, and shortness of breath within the last 2 weeks of travel OR . Been in close contact with a person diagnosed with COVID-19 within the last 2 weeks and have fever, cough, and shortness of breath . IF YOU DO NOT MEET THESE CRITERIA, YOU ARE CONSIDERED LOW RISK FOR COVID-19.  What to do if you are HIGH RISK for COVID-19?  . If you are having a medical emergency, call 911. . Seek medical care right away. Before you go to a doctor's office, urgent care or emergency department, call ahead and tell them about your recent travel, contact with someone diagnosed with COVID-19, and your symptoms. You should receive instructions from your physician's office regarding next steps of care.  . When you arrive at healthcare provider, tell the healthcare staff immediately you have returned from visiting [place], [place], [place], [place] or [place]; or traveled in the [place] to [place], [place], [place], or [place]; in the last two weeks or you have been in close contact with a person diagnosed with COVID-19 in the last 2 weeks.   . Tell the health care staff about your symptoms: fever, cough and shortness of breath. . After you have been seen by a medical provider, you will be either: o Tested for (COVID-19) and discharged home on quarantine except to seek medical care if symptoms worsen, and asked to  - Stay home and avoid contact with others until you get your results (4-5 days)  - Avoid travel on public transportation if possible (such as bus, train, or airplane) or o Sent to the Emergency Department by EMS for evaluation, COVID-19 testing, and possible  admission depending on your condition and test results.  What to do if you are LOW RISK for COVID-19?  Reduce your risk of any infection by using the same precautions used for avoiding the common cold or flu:  . Wash your hands often with soap and warm water for at least 20 seconds.  If soap and water are not readily available, use an alcohol-based hand sanitizer with at least 60% alcohol.  . If coughing or sneezing, cover your mouth and nose by coughing or sneezing into the elbow areas of your shirt or coat, into a tissue or into your sleeve (not your hands). . Avoid shaking hands with others and consider head nods or verbal greetings only. . Avoid touching your eyes, nose, or mouth with unwashed hands.  . Avoid close contact with people who are sick. . Avoid places or events with large numbers of people in one location, like concerts or sporting events. . Carefully consider travel plans you have or are making. . If you are planning any travel outside or inside the [place], visit the CDC's Travelers' Health webpage for the latest health notices. . If you have some symptoms but not all symptoms, continue to monitor at home and seek medical attention if your symptoms worsen. . If you are having a medical emergency, call 911.   ADDITIONAL HEALTHCARE OPTIONS FOR PATIENTS  [facility] / e-Visit: [email or web address]         [facility]: [phone]  Onset   Urgent Care: [facility]: [place] Discharge Instructions for Patients Receiving Chemotherapy  Today you received the following chemotherapy agents velcade  To help prevent nausea and vomiting after your treatment, we encourage you to take your nausea medication as directed   If you develop nausea and vomiting that is not controlled by your nausea medication, call the clinic.   BELOW ARE  SYMPTOMS THAT SHOULD BE REPORTED IMMEDIATELY:  *FEVER GREATER THAN 100.5 F  *CHILLS WITH OR WITHOUT FEVER  NAUSEA AND VOMITING THAT IS NOT CONTROLLED WITH YOUR NAUSEA MEDICATION  *UNUSUAL SHORTNESS OF BREATH  *UNUSUAL BRUISING OR BLEEDING  TENDERNESS IN MOUTH AND THROAT WITH OR WITHOUT PRESENCE OF ULCERS  *URINARY PROBLEMS  *BOWEL PROBLEMS  UNUSUAL RASH Items with * indicate a potential emergency and should be followed up as soon as possible.  Feel free to call the clinic should you have any questions or concerns. The clinic phone number is [phone].  Please show the [facility] at check-in to the Emergency Department and triage nurse.

## 2018-08-19 NOTE — Progress Notes (Signed)
Per Dr Julien Nordmann it is okay to treat pt with velcade today and platelets of 83K.

## 2018-08-26 ENCOUNTER — Inpatient Hospital Stay: Payer: Self-pay | Attending: Internal Medicine

## 2018-08-26 ENCOUNTER — Inpatient Hospital Stay: Payer: Self-pay

## 2018-08-26 ENCOUNTER — Other Ambulatory Visit: Payer: Self-pay

## 2018-08-26 VITALS — BP 133/76 | HR 79 | Temp 98.0°F | Resp 18 | Wt 202.0 lb

## 2018-08-26 DIAGNOSIS — R140 Abdominal distension (gaseous): Secondary | ICD-10-CM | POA: Insufficient documentation

## 2018-08-26 DIAGNOSIS — C9000 Multiple myeloma not having achieved remission: Secondary | ICD-10-CM

## 2018-08-26 DIAGNOSIS — R5383 Other fatigue: Secondary | ICD-10-CM | POA: Insufficient documentation

## 2018-08-26 DIAGNOSIS — Z5111 Encounter for antineoplastic chemotherapy: Secondary | ICD-10-CM | POA: Insufficient documentation

## 2018-08-26 DIAGNOSIS — Z79899 Other long term (current) drug therapy: Secondary | ICD-10-CM | POA: Insufficient documentation

## 2018-08-26 DIAGNOSIS — M7989 Other specified soft tissue disorders: Secondary | ICD-10-CM | POA: Insufficient documentation

## 2018-08-26 DIAGNOSIS — G629 Polyneuropathy, unspecified: Secondary | ICD-10-CM | POA: Insufficient documentation

## 2018-08-26 DIAGNOSIS — R21 Rash and other nonspecific skin eruption: Secondary | ICD-10-CM | POA: Insufficient documentation

## 2018-08-26 LAB — CBC WITH DIFFERENTIAL (CANCER CENTER ONLY)
Abs Immature Granulocytes: 0.01 10*3/uL (ref 0.00–0.07)
Basophils Absolute: 0 10*3/uL (ref 0.0–0.1)
Basophils Relative: 1 %
Eosinophils Absolute: 0.3 10*3/uL (ref 0.0–0.5)
Eosinophils Relative: 6 %
HCT: 37.8 % (ref 36.0–46.0)
Hemoglobin: 12.9 g/dL (ref 12.0–15.0)
Immature Granulocytes: 0 %
Lymphocytes Relative: 24 %
Lymphs Abs: 1.1 10*3/uL (ref 0.7–4.0)
MCH: 31.1 pg (ref 26.0–34.0)
MCHC: 34.1 g/dL (ref 30.0–36.0)
MCV: 91.1 fL (ref 80.0–100.0)
Monocytes Absolute: 0.4 10*3/uL (ref 0.1–1.0)
Monocytes Relative: 9 %
Neutro Abs: 2.7 10*3/uL (ref 1.7–7.7)
Neutrophils Relative %: 60 %
Platelet Count: 100 10*3/uL — ABNORMAL LOW (ref 150–400)
RBC: 4.15 MIL/uL (ref 3.87–5.11)
RDW: 13.1 % (ref 11.5–15.5)
WBC Count: 4.5 10*3/uL (ref 4.0–10.5)
nRBC: 0 % (ref 0.0–0.2)

## 2018-08-26 LAB — CMP (CANCER CENTER ONLY)
ALT: 13 U/L (ref 0–44)
AST: 19 U/L (ref 15–41)
Albumin: 3.5 g/dL (ref 3.5–5.0)
Alkaline Phosphatase: 66 U/L (ref 38–126)
Anion gap: 13 (ref 5–15)
BUN: 15 mg/dL (ref 8–23)
CO2: 23 mmol/L (ref 22–32)
Calcium: 8.8 mg/dL — ABNORMAL LOW (ref 8.9–10.3)
Chloride: 103 mmol/L (ref 98–111)
Creatinine: 1.19 mg/dL — ABNORMAL HIGH (ref 0.44–1.00)
GFR, Est AFR Am: 54 mL/min — ABNORMAL LOW (ref >60)
GFR, Estimated: 47 mL/min — ABNORMAL LOW (ref >60)
Glucose, Bld: 98 mg/dL (ref 70–99)
Potassium: 4 mmol/L (ref 3.5–5.1)
Sodium: 139 mmol/L (ref 135–145)
Total Bilirubin: 0.4 mg/dL (ref 0.3–1.2)
Total Protein: 7 g/dL (ref 6.5–8.1)

## 2018-08-26 MED ORDER — PROCHLORPERAZINE MALEATE 10 MG PO TABS: 10.0000 mg | ORAL_TABLET | Freq: Once | ORAL | Status: DC

## 2018-08-26 MED ORDER — BORTEZOMIB CHEMO SQ INJECTION 3.5 MG (2.5MG/ML)
1.3000 mg/m2 | Freq: Once | INTRAMUSCULAR | Status: AC
  Administered 2018-08-26: 2.75 mg via SUBCUTANEOUS
  Filled 2018-08-26: qty 1.1

## 2018-08-26 NOTE — Patient Instructions (Signed)
[facility] Discharge Instructions for Patients Receiving Chemotherapy  Today you received the following chemotherapy agents Velcade.  To help prevent nausea and vomiting after your treatment, we encourage you to take your nausea medication as directed.  If you develop nausea and vomiting that is not controlled by your nausea medication, call the clinic.   BELOW ARE SYMPTOMS THAT SHOULD BE REPORTED IMMEDIATELY:  *FEVER GREATER THAN 100.5 F  *CHILLS WITH OR WITHOUT FEVER  NAUSEA AND VOMITING THAT IS NOT CONTROLLED WITH YOUR NAUSEA MEDICATION  *UNUSUAL SHORTNESS OF BREATH  *UNUSUAL BRUISING OR BLEEDING  TENDERNESS IN MOUTH AND THROAT WITH OR WITHOUT PRESENCE OF ULCERS  *URINARY PROBLEMS  *BOWEL PROBLEMS  UNUSUAL RASH Items with * indicate a potential emergency and should be followed up as soon as possible.  Feel free to call the clinic should you have any questions or concerns. The clinic phone number is [phone].  Please show the CHEMO ALERT CARD at check-in to the Emergency Department and triage nurse.   

## 2018-09-02 ENCOUNTER — Other Ambulatory Visit: Payer: Self-pay

## 2018-09-02 ENCOUNTER — Inpatient Hospital Stay: Payer: Self-pay

## 2018-09-02 VITALS — BP 151/82 | Temp 98.0°F | Resp 18

## 2018-09-02 DIAGNOSIS — C9000 Multiple myeloma not having achieved remission: Secondary | ICD-10-CM

## 2018-09-02 LAB — CMP (CANCER CENTER ONLY)
ALT: 13 U/L (ref 0–44)
AST: 19 U/L (ref 15–41)
Albumin: 3.5 g/dL (ref 3.5–5.0)
Alkaline Phosphatase: 70 U/L (ref 38–126)
Anion gap: 11 (ref 5–15)
BUN: 17 mg/dL (ref 8–23)
CO2: 27 mmol/L (ref 22–32)
Calcium: 8.8 mg/dL — ABNORMAL LOW (ref 8.9–10.3)
Chloride: 104 mmol/L (ref 98–111)
Creatinine: 1.25 mg/dL — ABNORMAL HIGH (ref 0.44–1.00)
GFR, Est AFR Am: 51 mL/min — ABNORMAL LOW (ref >60)
GFR, Estimated: 44 mL/min — ABNORMAL LOW (ref >60)
Glucose, Bld: 84 mg/dL (ref 70–99)
Potassium: 4.1 mmol/L (ref 3.5–5.1)
Sodium: 142 mmol/L (ref 135–145)
Total Bilirubin: 0.4 mg/dL (ref 0.3–1.2)
Total Protein: 7.1 g/dL (ref 6.5–8.1)

## 2018-09-02 LAB — CBC WITH DIFFERENTIAL (CANCER CENTER ONLY)
Abs Immature Granulocytes: 0.02 10*3/uL (ref 0.00–0.07)
Basophils Absolute: 0 10*3/uL (ref 0.0–0.1)
Basophils Relative: 1 %
Eosinophils Absolute: 0.2 10*3/uL (ref 0.0–0.5)
Eosinophils Relative: 4 %
HCT: 39.2 % (ref 36.0–46.0)
Hemoglobin: 13.1 g/dL (ref 12.0–15.0)
Immature Granulocytes: 0 %
Lymphocytes Relative: 25 %
Lymphs Abs: 1.2 10*3/uL (ref 0.7–4.0)
MCH: 30.5 pg (ref 26.0–34.0)
MCHC: 33.4 g/dL (ref 30.0–36.0)
MCV: 91.4 fL (ref 80.0–100.0)
Monocytes Absolute: 0.5 10*3/uL (ref 0.1–1.0)
Monocytes Relative: 11 %
Neutro Abs: 2.8 10*3/uL (ref 1.7–7.7)
Neutrophils Relative %: 59 %
Platelet Count: 100 10*3/uL — ABNORMAL LOW (ref 150–400)
RBC: 4.29 MIL/uL (ref 3.87–5.11)
RDW: 12.9 % (ref 11.5–15.5)
WBC Count: 4.7 10*3/uL (ref 4.0–10.5)
nRBC: 0 % (ref 0.0–0.2)

## 2018-09-02 MED ORDER — BORTEZOMIB CHEMO SQ INJECTION 3.5 MG (2.5MG/ML)
1.3000 mg/m2 | Freq: Once | INTRAMUSCULAR | Status: AC
  Administered 2018-09-02: 2.75 mg via SUBCUTANEOUS
  Filled 2018-09-02: qty 1.1

## 2018-09-02 MED ORDER — PROCHLORPERAZINE MALEATE 10 MG PO TABS: 10.0000 mg | ORAL_TABLET | Freq: Once | ORAL | Status: DC

## 2018-09-02 NOTE — Patient Instructions (Signed)
[facility] Discharge Instructions for Patients Receiving Chemotherapy  Today you received the following chemotherapy agents Velcade.  To help prevent nausea and vomiting after your treatment, we encourage you to take your nausea medication as directed.  If you develop nausea and vomiting that is not controlled by your nausea medication, call the clinic.   BELOW ARE SYMPTOMS THAT SHOULD BE REPORTED IMMEDIATELY:  *FEVER GREATER THAN 100.5 F  *CHILLS WITH OR WITHOUT FEVER  NAUSEA AND VOMITING THAT IS NOT CONTROLLED WITH YOUR NAUSEA MEDICATION  *UNUSUAL SHORTNESS OF BREATH  *UNUSUAL BRUISING OR BLEEDING  TENDERNESS IN MOUTH AND THROAT WITH OR WITHOUT PRESENCE OF ULCERS  *URINARY PROBLEMS  *BOWEL PROBLEMS  UNUSUAL RASH Items with * indicate a potential emergency and should be followed up as soon as possible.  Feel free to call the clinic should you have any questions or concerns. The clinic phone number is [phone].  Please show the CHEMO ALERT CARD at check-in to the Emergency Department and triage nurse.   

## 2018-09-09 ENCOUNTER — Inpatient Hospital Stay (HOSPITAL_BASED_OUTPATIENT_CLINIC_OR_DEPARTMENT_OTHER): Payer: Self-pay | Admitting: Physician Assistant

## 2018-09-09 ENCOUNTER — Inpatient Hospital Stay: Payer: Self-pay

## 2018-09-09 ENCOUNTER — Encounter: Payer: Self-pay | Admitting: Physician Assistant

## 2018-09-09 ENCOUNTER — Other Ambulatory Visit: Payer: Self-pay

## 2018-09-09 ENCOUNTER — Telehealth: Payer: Self-pay | Admitting: Internal Medicine

## 2018-09-09 DIAGNOSIS — Z79899 Other long term (current) drug therapy: Secondary | ICD-10-CM

## 2018-09-09 DIAGNOSIS — C9000 Multiple myeloma not having achieved remission: Secondary | ICD-10-CM

## 2018-09-09 DIAGNOSIS — R140 Abdominal distension (gaseous): Secondary | ICD-10-CM

## 2018-09-09 DIAGNOSIS — M7989 Other specified soft tissue disorders: Secondary | ICD-10-CM

## 2018-09-09 DIAGNOSIS — R21 Rash and other nonspecific skin eruption: Secondary | ICD-10-CM

## 2018-09-09 DIAGNOSIS — G629 Polyneuropathy, unspecified: Secondary | ICD-10-CM

## 2018-09-09 DIAGNOSIS — Z5111 Encounter for antineoplastic chemotherapy: Secondary | ICD-10-CM

## 2018-09-09 DIAGNOSIS — R5383 Other fatigue: Secondary | ICD-10-CM

## 2018-09-09 LAB — CMP (CANCER CENTER ONLY)
ALT: 13 U/L (ref 0–44)
AST: 21 U/L (ref 15–41)
Albumin: 3.9 g/dL (ref 3.5–5.0)
Alkaline Phosphatase: 72 U/L (ref 38–126)
Anion gap: 12 (ref 5–15)
BUN: 14 mg/dL (ref 8–23)
CO2: 26 mmol/L (ref 22–32)
Calcium: 9 mg/dL (ref 8.9–10.3)
Chloride: 103 mmol/L (ref 98–111)
Creatinine: 1.13 mg/dL — ABNORMAL HIGH (ref 0.44–1.00)
GFR, Est AFR Am: 58 mL/min — ABNORMAL LOW (ref >60)
GFR, Estimated: 50 mL/min — ABNORMAL LOW (ref >60)
Glucose, Bld: 91 mg/dL (ref 70–99)
Potassium: 3.9 mmol/L (ref 3.5–5.1)
Sodium: 141 mmol/L (ref 135–145)
Total Bilirubin: 0.5 mg/dL (ref 0.3–1.2)
Total Protein: 7.8 g/dL (ref 6.5–8.1)

## 2018-09-09 LAB — CBC WITH DIFFERENTIAL (CANCER CENTER ONLY)
Abs Immature Granulocytes: 0.01 10*3/uL (ref 0.00–0.07)
Basophils Absolute: 0 10*3/uL (ref 0.0–0.1)
Basophils Relative: 1 %
Eosinophils Absolute: 0.1 10*3/uL (ref 0.0–0.5)
Eosinophils Relative: 2 %
HCT: 40.3 % (ref 36.0–46.0)
Hemoglobin: 13.7 g/dL (ref 12.0–15.0)
Immature Granulocytes: 0 %
Lymphocytes Relative: 20 %
Lymphs Abs: 1 10*3/uL (ref 0.7–4.0)
MCH: 31.1 pg (ref 26.0–34.0)
MCHC: 34 g/dL (ref 30.0–36.0)
MCV: 91.4 fL (ref 80.0–100.0)
Monocytes Absolute: 0.2 10*3/uL (ref 0.1–1.0)
Monocytes Relative: 5 %
Neutro Abs: 3.7 10*3/uL (ref 1.7–7.7)
Neutrophils Relative %: 72 %
Platelet Count: 100 10*3/uL — ABNORMAL LOW (ref 150–400)
RBC: 4.41 MIL/uL (ref 3.87–5.11)
RDW: 13.1 % (ref 11.5–15.5)
WBC Count: 5.1 10*3/uL (ref 4.0–10.5)
nRBC: 0 % (ref 0.0–0.2)

## 2018-09-09 MED ORDER — BORTEZOMIB CHEMO SQ INJECTION 3.5 MG (2.5MG/ML)
1.3000 mg/m2 | Freq: Once | INTRAMUSCULAR | Status: AC
  Administered 2018-09-09: 2.75 mg via SUBCUTANEOUS
  Filled 2018-09-09: qty 1.1

## 2018-09-09 MED ORDER — PROCHLORPERAZINE MALEATE 10 MG PO TABS: 10.0000 mg | ORAL_TABLET | Freq: Once | ORAL | Status: DC

## 2018-09-09 MED ORDER — DEXAMETHASONE 4 MG PO TABS: 40.0000 mg | ORAL_TABLET | ORAL | 3 refills | Status: DC

## 2018-09-09 NOTE — Progress Notes (Signed)
Decadron taken at home this am per pt.

## 2018-09-09 NOTE — Progress Notes (Signed)
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  PRIOR THERAPY: None  CURRENT THERAPY: Systemic therapy with Velcade 1.3 mg/M2 weekly, Revlimid 25 mg p.o. daily for 14 days every 3 weeks in addition to weekly Decadron 40 mg orally. First dose of treatment 10/08/2017.Treatment was placed on hold due to development of a significant rash.Sheresumedtreatment with dexamethasone and Velcade on 11/19/2017. She will not resume treatment of Revlimid due to the rash.Status post 11 months of treatment.  INTERVAL HISTORY: Debbie Chan 68 y.o. female returns to the clinic for a follow-up visit.  The patient is feeling fairly well today without any concerning complaints except for some bloating in her abdomen and mild lower extremity swelling bilaterally.  She continues to tolerate her treatment with Velcade and Decadron fairly well.  She denies any chest pain, shortness of breath, cough, or hemoptysis.  She denies any fevers, chills, night sweats or weight loss.  She denies any nausea, vomiting, diarrhea, constipation, or abdominal pain.  She denies any bleeding or bruising.  She denies any headache or visual changes.  She is here today for evaluation before starting cycle #15.   MEDICAL HISTORY:No past medical history on file.  ALLERGIES:  has No Known Allergies.  MEDICATIONS:  Current Outpatient Medications  Medication Sig Dispense Refill  . acyclovir (ZOVIRAX) 200 MG capsule TAKE 1 CAPSULE BY MOUTH 2 TIMES DAILY. 60 capsule 2  . blood glucose meter kit and supplies Dispense based on patient and insurance preference. Use up to four times daily as directed. (FOR ICD-10 E10.9, E11.9). 1 each 0  . dexamethasone (DECADRON) 4 MG tablet Take 10 tablets (40 mg total) by mouth every 7 (seven) days. 40 tablet 3  . gabapentin (NEURONTIN) 100 MG capsule Take 1 capsule (100 mg total)  by mouth 3 (three) times daily. 90 capsule 1  . omeprazole (PRILOSEC) 40 MG capsule TAKE 1 CAPSULE BY MOUTH DAILY. 30 capsule 0  . prochlorperazine (COMPAZINE) 10 MG tablet Take 1 tablet (10 mg total) by mouth every 6 (six) hours as needed for nausea or vomiting. 30 tablet 0  . RELION GLUCOSE TEST STRIPS test strip USE 1 TO CHECK GLUCOSE ONCE DAILY    . ReliOn Ultra Thin Lancets 30G MISC USE 1 TO CHECK GLUCOSE ONCE DAILY FOR GLUCOSE TESTING    . diphenhydrAMINE (BENADRYL ALLERGY) 25 MG tablet Take 1 tablet (25 mg total) by mouth every 6 (six) hours as needed for itching. (Patient not taking: Reported on 11/26/2017) 100 tablet 0  . furosemide (LASIX) 20 MG tablet Take once daily for 3 days for lower extremity edema, then stop. Repeat as needed for recurrent edema. (Patient not taking: Reported on 12/24/2017) 30 tablet 2  . metoprolol tartrate (LOPRESSOR) 25 MG tablet Take 0.5 tablets (12.5 mg total) by mouth 2 (two) times daily. 30 tablet 0  . oxyCODONE-acetaminophen (PERCOCET/ROXICET) 5-325 MG tablet Take 1 tablet by mouth every 8 (eight) hours as needed for severe pain. (Patient not taking: Reported on 06/24/2018) 30 tablet 0  . polyethylene glycol (MIRALAX / GLYCOLAX) packet Take 17 g by mouth daily. (Patient not taking: Reported on 11/26/2017) 14 each 0  . traZODone (DESYREL) 50 MG tablet Take 1 tablet (50 mg total) by mouth at bedtime. (Patient not taking: Reported on 12/24/2017) 30 tablet 0   No current facility-administered medications for this visit.    Facility-Administered Medications Ordered in Other Visits  Medication Dose Route Frequency Provider Last Rate Last Dose  . prochlorperazine (COMPAZINE) tablet 10 mg  10 mg Oral Once Curt Bears, MD        SURGICAL HISTORY: No past surgical history on file.  REVIEW OF SYSTEMS:   Review of Systems  Constitutional: Negative for appetite change, chills, fatigue, fever and unexpected weight change.  HENT:   Negative for mouth sores,  nosebleeds, sore throat and trouble swallowing.   Eyes: Negative for eye problems and icterus.  Respiratory: Negative for cough, hemoptysis, shortness of breath and wheezing.   Cardiovascular: Positive for mild lower extremity swelling bilaterally. Negative for chest pain. Gastrointestinal: Positive for bloating. Negative for abdominal pain, constipation, diarrhea, nausea and vomiting.  Genitourinary: Negative for bladder incontinence, difficulty urinating, dysuria, frequency and hematuria.   Musculoskeletal: Negative for back pain, gait problem, neck pain and neck stiffness.  Skin: Negative for itching and rash.  Neurological: Negative for dizziness, extremity weakness, gait problem, headaches, light-headedness and seizures.  Hematological: Negative for adenopathy. Does not bruise/bleed easily.  Psychiatric/Behavioral: Negative for confusion, depression and sleep disturbance. The patient is not nervous/anxious.     PHYSICAL EXAMINATION:  Blood pressure (!) 147/74, pulse 77, temperature 98 F (36.7 C), temperature source Oral, resp. rate 20, height '5\' 8"'  (1.727 m), weight 202 lb (91.6 kg), SpO2 100 %.  ECOG PERFORMANCE STATUS: 1 - Symptomatic but completely ambulatory  Physical Exam  Constitutional: Oriented to person, place, and time and well-developed, well-nourished, and in no distress.  HENT:  Head: Normocephalic and atraumatic.  Mouth/Throat: Oropharynx is clear and moist. No oropharyngeal exudate.  Eyes: Conjunctivae are normal. Right eye exhibits no discharge. Left eye exhibits no discharge. No scleral icterus.  Neck: Normal range of motion. Neck supple.  Cardiovascular: Normal rate, regular rhythm, normal heart sounds and intact distal pulses.   Pulmonary/Chest: Effort normal and breath sounds normal. No respiratory distress. No wheezes. No rales.  Abdominal: Soft. Bowel sounds are normal. Exhibits no distension and no mass. There is no tenderness.  Musculoskeletal: Mild lower  extremity edema. Normal range of motion.  Lymphadenopathy:    No cervical adenopathy.  Neurological: Alert and oriented to person, place, and time. Exhibits normal muscle tone. Gait normal. Coordination normal.  Skin: Skin is warm and dry. No rash noted. Not diaphoretic. No erythema. No pallor.  Psychiatric: Mood, memory and judgment normal.  Vitals reviewed.  LABORATORY DATA: Lab Results  Component Value Date   WBC 5.1 09/09/2018   HGB 13.7 09/09/2018   HCT 40.3 09/09/2018   MCV 91.4 09/09/2018   PLT 100 (L) 09/09/2018      Chemistry      Component Value Date/Time   NA 141 09/09/2018 0854   K 3.9 09/09/2018 0854   CL 103 09/09/2018 0854   CO2 26 09/09/2018 0854   BUN 14 09/09/2018 0854   CREATININE 1.13 (H) 09/09/2018 0854      Component Value Date/Time   CALCIUM 9.0 09/09/2018 0854   ALKPHOS 72 09/09/2018 0854   AST 21 09/09/2018 0854   ALT 13 09/09/2018 0854   BILITOT 0.5 09/09/2018 0854       RADIOGRAPHIC STUDIES:  No results found.   ASSESSMENT/PLAN:  This is a very pleasant 68 years old African female originally from [place] who is visiting her son in [place] and was recently diagnosed with IgA multiple myeloma. The patient was a started initially on treatment with weekly subcutaneous Velcade 1.3 mg/M2, Revlimid 25 mg p.o. daily for 21  days every 4 weeks as well as weekly Decadron 40 mg orally.  She was tolerating the treatment well but she developed significant skin rash secondary to treatment with Revlimid and this was discontinued. The patient continued treatment with subcutaneous Velcade and Decadron. The patient has been tolerating this treatment well with no concerning adverse effects. The patient was seen with Dr. Julien Nordmann today. Labs were reviewed.  We recommended for her to continue her current treatment with Velcade and Decadron. I will see her back for follow-up visit in 3 weeks for evaluation. I sent a refill to her pharmacy for  her decadron.  The patient was advised to call immediately if she has any concerning symptoms in the interval. The patient voices understanding of current disease status and treatment options and is in agreement with the current care plan. All questions were answered. The patient knows to call the clinic with any problems, questions or concerns. We can certainly see the patient much sooner if necessary  No orders of the defined types were placed in this encounter.    Cassandra L Heilingoetter, PA-C 09/09/18]  ADDENDUM: Hematology/Oncology Attending: I had a face-to-face encounter with the patient today.  I recommended her care plan.  This is a very pleasant 68 years old African female with IgA multiple myeloma.  She was initially started on treatment with weekly subcutaneous Velcade, Revlimid and Decadron but the patient had hypersensitivity reaction to Revlimid and this was discontinued.  She has been on treatment with Velcade and Decadron only for the last few months.  She has been tolerating the treatment well except for fatigue and peripheral neuropathy.  She was treated in the past with gabapentin but she stopped it on her own because of concern about the adverse effects and dizzy spells. She is here today for evaluation before her treatment. I recommended for the patient to continue her treatment with subcutaneous Velcade and Decadron for now.  I will see her back for follow-up visit in 3 weeks for evaluation.  It was noted in the last few months that the patient had mild disease progression with increase in the free lambda light chain.  I will monitor her closely and may consider changing treatment if she has any further increase in the upcoming myeloma panel. She was advised to call immediately if she has any concerning symptoms in the interval.  Disclaimer: This Chan was dictated with voice recognition software. Similar sounding words can inadvertently be transcribed and may be missed upon  review. Eilleen Kempf, MD 09/09/18

## 2018-09-09 NOTE — Telephone Encounter (Signed)
Scheduled per 07/16 los, patient will receive calender for future appointments.

## 2018-09-09 NOTE — Patient Instructions (Signed)
[facility] Discharge Instructions for Patients Receiving Chemotherapy  Today you received the following chemotherapy agents Velcade.  To help prevent nausea and vomiting after your treatment, we encourage you to take your nausea medication as directed.  If you develop nausea and vomiting that is not controlled by your nausea medication, call the clinic.   BELOW ARE SYMPTOMS THAT SHOULD BE REPORTED IMMEDIATELY:  *FEVER GREATER THAN 100.5 F  *CHILLS WITH OR WITHOUT FEVER  NAUSEA AND VOMITING THAT IS NOT CONTROLLED WITH YOUR NAUSEA MEDICATION  *UNUSUAL SHORTNESS OF BREATH  *UNUSUAL BRUISING OR BLEEDING  TENDERNESS IN MOUTH AND THROAT WITH OR WITHOUT PRESENCE OF ULCERS  *URINARY PROBLEMS  *BOWEL PROBLEMS  UNUSUAL RASH Items with * indicate a potential emergency and should be followed up as soon as possible.  Feel free to call the clinic should you have any questions or concerns. The clinic phone number is [phone].  Please show the CHEMO ALERT CARD at check-in to the Emergency Department and triage nurse.   

## 2018-09-14 ENCOUNTER — Encounter: Payer: Self-pay | Admitting: Pharmacy Technician

## 2018-09-14 NOTE — Progress Notes (Signed)
The patient has been re enrolled with Velcade Reimbursement Assistance Program until 09/14/19. Drug replacement will continue without interruption.

## 2018-09-16 ENCOUNTER — Inpatient Hospital Stay: Payer: Self-pay

## 2018-09-16 ENCOUNTER — Other Ambulatory Visit: Payer: Self-pay | Admitting: Physician Assistant

## 2018-09-16 ENCOUNTER — Other Ambulatory Visit: Payer: Self-pay

## 2018-09-16 VITALS — BP 125/78 | HR 69 | Temp 98.0°F | Resp 16

## 2018-09-16 DIAGNOSIS — K219 Gastro-esophageal reflux disease without esophagitis: Secondary | ICD-10-CM

## 2018-09-16 DIAGNOSIS — C9000 Multiple myeloma not having achieved remission: Secondary | ICD-10-CM

## 2018-09-16 LAB — CBC WITH DIFFERENTIAL (CANCER CENTER ONLY)
Abs Immature Granulocytes: 0.01 10*3/uL (ref 0.00–0.07)
Basophils Absolute: 0 10*3/uL (ref 0.0–0.1)
Basophils Relative: 1 %
Eosinophils Absolute: 0.1 10*3/uL (ref 0.0–0.5)
Eosinophils Relative: 2 %
HCT: 38.3 % (ref 36.0–46.0)
Hemoglobin: 13.1 g/dL (ref 12.0–15.0)
Immature Granulocytes: 0 %
Lymphocytes Relative: 23 %
Lymphs Abs: 1 10*3/uL (ref 0.7–4.0)
MCH: 31.6 pg (ref 26.0–34.0)
MCHC: 34.2 g/dL (ref 30.0–36.0)
MCV: 92.3 fL (ref 80.0–100.0)
Monocytes Absolute: 0.3 10*3/uL (ref 0.1–1.0)
Monocytes Relative: 7 %
Neutro Abs: 3 10*3/uL (ref 1.7–7.7)
Neutrophils Relative %: 67 %
Platelet Count: 105 10*3/uL — ABNORMAL LOW (ref 150–400)
RBC: 4.15 MIL/uL (ref 3.87–5.11)
RDW: 13.2 % (ref 11.5–15.5)
WBC Count: 4.4 10*3/uL (ref 4.0–10.5)
nRBC: 0 % (ref 0.0–0.2)

## 2018-09-16 LAB — CMP (CANCER CENTER ONLY)
ALT: 10 U/L (ref 0–44)
AST: 16 U/L (ref 15–41)
Albumin: 3.5 g/dL (ref 3.5–5.0)
Alkaline Phosphatase: 76 U/L (ref 38–126)
Anion gap: 10 (ref 5–15)
BUN: 16 mg/dL (ref 8–23)
CO2: 27 mmol/L (ref 22–32)
Calcium: 8.9 mg/dL (ref 8.9–10.3)
Chloride: 103 mmol/L (ref 98–111)
Creatinine: 1.23 mg/dL — ABNORMAL HIGH (ref 0.44–1.00)
GFR, Est AFR Am: 52 mL/min — ABNORMAL LOW (ref >60)
GFR, Estimated: 45 mL/min — ABNORMAL LOW (ref >60)
Glucose, Bld: 94 mg/dL (ref 70–99)
Potassium: 4.3 mmol/L (ref 3.5–5.1)
Sodium: 140 mmol/L (ref 135–145)
Total Bilirubin: 0.4 mg/dL (ref 0.3–1.2)
Total Protein: 7 g/dL (ref 6.5–8.1)

## 2018-09-16 MED ORDER — OMEPRAZOLE 40 MG PO CPDR: 40.0000 mg | DELAYED_RELEASE_CAPSULE | Freq: Every day | ORAL | 1 refills | Status: DC

## 2018-09-16 MED ORDER — BORTEZOMIB CHEMO SQ INJECTION 3.5 MG (2.5MG/ML)
1.3000 mg/m2 | Freq: Once | INTRAMUSCULAR | Status: AC
  Administered 2018-09-16: 2.75 mg via SUBCUTANEOUS
  Filled 2018-09-16: qty 1.1

## 2018-09-16 MED ORDER — PROCHLORPERAZINE MALEATE 10 MG PO TABS: 10.0000 mg | ORAL_TABLET | Freq: Once | ORAL | Status: DC

## 2018-09-16 MED ORDER — DEXAMETHASONE 4 MG PO TABS: 40.0000 mg | ORAL_TABLET | ORAL | 3 refills | Status: DC

## 2018-09-16 MED FILL — METOPROLOL TARTRATE 25 MG T: 25 | 30 days supply | Qty: 30 | Fill #3

## 2018-09-16 MED FILL — DEXAMETHASONE 4 MG TABLET: 4 | 6 days supply | Qty: 40 | Fill #0

## 2018-09-16 MED FILL — OMEPRAZOLE 40 MG CPDR: 40 | 30 days supply | Qty: 30 | Fill #0

## 2018-09-16 NOTE — Patient Instructions (Signed)
[facility] Discharge Instructions for Patients Receiving Chemotherapy  Today you received the following chemotherapy agents Velcade.  To help prevent nausea and vomiting after your treatment, we encourage you to take your nausea medication as directed.  If you develop nausea and vomiting that is not controlled by your nausea medication, call the clinic.   BELOW ARE SYMPTOMS THAT SHOULD BE REPORTED IMMEDIATELY:  *FEVER GREATER THAN 100.5 F  *CHILLS WITH OR WITHOUT FEVER  NAUSEA AND VOMITING THAT IS NOT CONTROLLED WITH YOUR NAUSEA MEDICATION  *UNUSUAL SHORTNESS OF BREATH  *UNUSUAL BRUISING OR BLEEDING  TENDERNESS IN MOUTH AND THROAT WITH OR WITHOUT PRESENCE OF ULCERS  *URINARY PROBLEMS  *BOWEL PROBLEMS  UNUSUAL RASH Items with * indicate a potential emergency and should be followed up as soon as possible.  Feel free to call the clinic should you have any questions or concerns. The clinic phone number is [phone].  Please show the CHEMO ALERT CARD at check-in to the Emergency Department and triage nurse.   

## 2018-09-23 ENCOUNTER — Inpatient Hospital Stay: Payer: Self-pay

## 2018-09-23 ENCOUNTER — Other Ambulatory Visit: Payer: Self-pay

## 2018-09-23 VITALS — BP 137/80 | HR 78 | Temp 98.3°F | Resp 18

## 2018-09-23 DIAGNOSIS — C9000 Multiple myeloma not having achieved remission: Secondary | ICD-10-CM

## 2018-09-23 LAB — CMP (CANCER CENTER ONLY)
ALT: 13 U/L (ref 0–44)
AST: 19 U/L (ref 15–41)
Albumin: 3.4 g/dL — ABNORMAL LOW (ref 3.5–5.0)
Alkaline Phosphatase: 68 U/L (ref 38–126)
Anion gap: 10 (ref 5–15)
BUN: 15 mg/dL (ref 8–23)
CO2: 27 mmol/L (ref 22–32)
Calcium: 8.9 mg/dL (ref 8.9–10.3)
Chloride: 103 mmol/L (ref 98–111)
Creatinine: 1.24 mg/dL — ABNORMAL HIGH (ref 0.44–1.00)
GFR, Est AFR Am: 52 mL/min — ABNORMAL LOW (ref >60)
GFR, Estimated: 45 mL/min — ABNORMAL LOW (ref >60)
Glucose, Bld: 87 mg/dL (ref 70–99)
Potassium: 4.4 mmol/L (ref 3.5–5.1)
Sodium: 140 mmol/L (ref 135–145)
Total Bilirubin: 0.4 mg/dL (ref 0.3–1.2)
Total Protein: 6.8 g/dL (ref 6.5–8.1)

## 2018-09-23 LAB — CBC WITH DIFFERENTIAL (CANCER CENTER ONLY)
Abs Immature Granulocytes: 0.02 10*3/uL (ref 0.00–0.07)
Basophils Absolute: 0 10*3/uL (ref 0.0–0.1)
Basophils Relative: 0 %
Eosinophils Absolute: 0.1 10*3/uL (ref 0.0–0.5)
Eosinophils Relative: 3 %
HCT: 38.1 % (ref 36.0–46.0)
Hemoglobin: 12.8 g/dL (ref 12.0–15.0)
Immature Granulocytes: 1 %
Lymphocytes Relative: 31 %
Lymphs Abs: 1.3 10*3/uL (ref 0.7–4.0)
MCH: 31.4 pg (ref 26.0–34.0)
MCHC: 33.6 g/dL (ref 30.0–36.0)
MCV: 93.4 fL (ref 80.0–100.0)
Monocytes Absolute: 0.5 10*3/uL (ref 0.1–1.0)
Monocytes Relative: 12 %
Neutro Abs: 2.3 10*3/uL (ref 1.7–7.7)
Neutrophils Relative %: 53 %
Platelet Count: 91 10*3/uL — ABNORMAL LOW (ref 150–400)
RBC: 4.08 MIL/uL (ref 3.87–5.11)
RDW: 13 % (ref 11.5–15.5)
WBC Count: 4.2 10*3/uL (ref 4.0–10.5)
nRBC: 0 % (ref 0.0–0.2)

## 2018-09-23 MED ORDER — PROCHLORPERAZINE MALEATE 10 MG PO TABS
ORAL_TABLET | ORAL | Status: AC
  Filled 2018-09-23: qty 1

## 2018-09-23 MED ORDER — BORTEZOMIB CHEMO SQ INJECTION 3.5 MG (2.5MG/ML)
1.3000 mg/m2 | Freq: Once | INTRAMUSCULAR | Status: AC
  Administered 2018-09-23: 2.75 mg via SUBCUTANEOUS
  Filled 2018-09-23: qty 1.1

## 2018-09-23 MED ORDER — PROCHLORPERAZINE MALEATE 10 MG PO TABS
10.0000 mg | ORAL_TABLET | Freq: Once | ORAL | Status: AC
  Administered 2018-09-23: 09:00:00 10 mg via ORAL

## 2018-09-23 MED FILL — ACYCLOVIR 200 MG CAP: 200 | 30 days supply | Qty: 60 | Fill #1

## 2018-09-23 NOTE — Progress Notes (Signed)
PLT 91 this a.m., Dr. Julien Nordmann informed, ok to proceed with treatment today.

## 2018-09-23 NOTE — Patient Instructions (Signed)
[facility] Discharge Instructions for Patients Receiving Chemotherapy  Today you received the following chemotherapy agents Velcade.  To help prevent nausea and vomiting after your treatment, we encourage you to take your nausea medication as directed.  If you develop nausea and vomiting that is not controlled by your nausea medication, call the clinic.   BELOW ARE SYMPTOMS THAT SHOULD BE REPORTED IMMEDIATELY:  *FEVER GREATER THAN 100.5 F  *CHILLS WITH OR WITHOUT FEVER  NAUSEA AND VOMITING THAT IS NOT CONTROLLED WITH YOUR NAUSEA MEDICATION  *UNUSUAL SHORTNESS OF BREATH  *UNUSUAL BRUISING OR BLEEDING  TENDERNESS IN MOUTH AND THROAT WITH OR WITHOUT PRESENCE OF ULCERS  *URINARY PROBLEMS  *BOWEL PROBLEMS  UNUSUAL RASH Items with * indicate a potential emergency and should be followed up as soon as possible.  Feel free to call the clinic should you have any questions or concerns. The clinic phone number is [phone].  Please show the CHEMO ALERT CARD at check-in to the Emergency Department and triage nurse.   

## 2018-09-30 ENCOUNTER — Inpatient Hospital Stay: Payer: Self-pay | Attending: Internal Medicine

## 2018-09-30 ENCOUNTER — Inpatient Hospital Stay: Payer: Self-pay

## 2018-09-30 ENCOUNTER — Inpatient Hospital Stay (HOSPITAL_BASED_OUTPATIENT_CLINIC_OR_DEPARTMENT_OTHER): Payer: Self-pay | Admitting: Internal Medicine

## 2018-09-30 ENCOUNTER — Encounter: Payer: Self-pay | Admitting: Internal Medicine

## 2018-09-30 ENCOUNTER — Other Ambulatory Visit: Payer: Self-pay

## 2018-09-30 VITALS — BP 136/68 | HR 76 | Temp 98.0°F | Resp 18 | Ht 68.0 in | Wt 204.5 lb

## 2018-09-30 DIAGNOSIS — Z923 Personal history of irradiation: Secondary | ICD-10-CM | POA: Insufficient documentation

## 2018-09-30 DIAGNOSIS — R21 Rash and other nonspecific skin eruption: Secondary | ICD-10-CM | POA: Insufficient documentation

## 2018-09-30 DIAGNOSIS — C9000 Multiple myeloma not having achieved remission: Secondary | ICD-10-CM

## 2018-09-30 DIAGNOSIS — G629 Polyneuropathy, unspecified: Secondary | ICD-10-CM | POA: Insufficient documentation

## 2018-09-30 DIAGNOSIS — R5383 Other fatigue: Secondary | ICD-10-CM | POA: Insufficient documentation

## 2018-09-30 DIAGNOSIS — Z79899 Other long term (current) drug therapy: Secondary | ICD-10-CM | POA: Insufficient documentation

## 2018-09-30 DIAGNOSIS — Z5111 Encounter for antineoplastic chemotherapy: Secondary | ICD-10-CM

## 2018-09-30 LAB — CBC WITH DIFFERENTIAL (CANCER CENTER ONLY)
Abs Immature Granulocytes: 0.01 10*3/uL (ref 0.00–0.07)
Basophils Absolute: 0 10*3/uL (ref 0.0–0.1)
Basophils Relative: 1 %
Eosinophils Absolute: 0.1 10*3/uL (ref 0.0–0.5)
Eosinophils Relative: 2 %
HCT: 38 % (ref 36.0–46.0)
Hemoglobin: 12.7 g/dL (ref 12.0–15.0)
Immature Granulocytes: 0 %
Lymphocytes Relative: 32 %
Lymphs Abs: 1.4 10*3/uL (ref 0.7–4.0)
MCH: 31.1 pg (ref 26.0–34.0)
MCHC: 33.4 g/dL (ref 30.0–36.0)
MCV: 92.9 fL (ref 80.0–100.0)
Monocytes Absolute: 0.5 10*3/uL (ref 0.1–1.0)
Monocytes Relative: 11 %
Neutro Abs: 2.3 10*3/uL (ref 1.7–7.7)
Neutrophils Relative %: 54 %
Platelet Count: 97 10*3/uL — ABNORMAL LOW (ref 150–400)
RBC: 4.09 MIL/uL (ref 3.87–5.11)
RDW: 13.1 % (ref 11.5–15.5)
WBC Count: 4.2 10*3/uL (ref 4.0–10.5)
nRBC: 0 % (ref 0.0–0.2)

## 2018-09-30 LAB — CMP (CANCER CENTER ONLY)
ALT: 14 U/L (ref 0–44)
AST: 16 U/L (ref 15–41)
Albumin: 3.5 g/dL (ref 3.5–5.0)
Alkaline Phosphatase: 62 U/L (ref 38–126)
Anion gap: 11 (ref 5–15)
BUN: 17 mg/dL (ref 8–23)
CO2: 25 mmol/L (ref 22–32)
Calcium: 9 mg/dL (ref 8.9–10.3)
Chloride: 105 mmol/L (ref 98–111)
Creatinine: 1.3 mg/dL — ABNORMAL HIGH (ref 0.44–1.00)
GFR, Est AFR Am: 49 mL/min — ABNORMAL LOW (ref >60)
GFR, Estimated: 42 mL/min — ABNORMAL LOW (ref >60)
Glucose, Bld: 102 mg/dL — ABNORMAL HIGH (ref 70–99)
Potassium: 4.4 mmol/L (ref 3.5–5.1)
Sodium: 141 mmol/L (ref 135–145)
Total Bilirubin: 0.4 mg/dL (ref 0.3–1.2)
Total Protein: 6.8 g/dL (ref 6.5–8.1)

## 2018-09-30 MED ORDER — BORTEZOMIB CHEMO SQ INJECTION 3.5 MG (2.5MG/ML)
1.3000 mg/m2 | Freq: Once | INTRAMUSCULAR | Status: AC
  Administered 2018-09-30: 2.75 mg via SUBCUTANEOUS
  Filled 2018-09-30: qty 1.1

## 2018-09-30 MED ORDER — PROCHLORPERAZINE MALEATE 10 MG PO TABS
10.0000 mg | ORAL_TABLET | Freq: Once | ORAL | Status: AC
  Administered 2018-09-30: 10 mg via ORAL

## 2018-09-30 MED ORDER — PROCHLORPERAZINE MALEATE 10 MG PO TABS
ORAL_TABLET | ORAL | Status: AC
  Filled 2018-09-30: qty 1

## 2018-09-30 NOTE — Progress Notes (Signed)
Platelets 97, OK to proceed with treatment today per Dr. Julien Nordmann.

## 2018-09-30 NOTE — Progress Notes (Signed)
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  PRIOR THERAPY: None  CURRENT THERAPY: Systemic therapy with Velcade 1.3 mg/M2 weekly, Revlimid 25 mg p.o. daily for 14 days every 3 weeks in addition to weekly Decadron 40 mg orally. First dose of treatment 10/08/2017.Treatment was placed on hold due to development of a significant rash.Sheresumedtreatment with dexamethasone and Velcade on 11/19/2017. She will not resume treatment of Revlimid due to the rash.  Status post 9 months of treatment.  INTERVAL HISTORY: Debbie Chan 68 y.o. female returns to the clinic today for follow-up visit.  The patient is feeling fine with no concerning complaints except for mild fatigue and mild peripheral neuropathy in the lower extremities.  She is feeling much better than before.  She denied having any current nausea, vomiting, diarrhea or constipation.  She has no headache or visual changes.  She denied having any weight loss or night sweats.  She has no chest pain, shortness of breath, cough or hemoptysis.  She continues to tolerate her treatment with Velcade and Decadron fairly well.  MEDICAL HISTORY:No past medical history on file.  ALLERGIES:  has No Known Allergies.  MEDICATIONS:  Current Outpatient Medications  Medication Sig Dispense Refill  . acyclovir (ZOVIRAX) 200 MG capsule TAKE 1 CAPSULE BY MOUTH 2 TIMES DAILY. 60 capsule 2  . blood glucose meter kit and supplies Dispense based on patient and insurance preference. Use up to four times daily as directed. (FOR ICD-10 E10.9, E11.9). 1 each 0  . dexamethasone (DECADRON) 4 MG tablet Take 10 tablets (40 mg total) by mouth every 7 (seven) days. 40 tablet 3  . diphenhydrAMINE (BENADRYL ALLERGY) 25 MG tablet Take 1 tablet (25 mg total) by mouth every 6 (six) hours as  needed for itching. (Patient not taking: Reported on 11/26/2017) 100 tablet 0  . furosemide (LASIX) 20 MG tablet Take once daily for 3 days for lower extremity edema, then stop. Repeat as needed for recurrent edema. (Patient not taking: Reported on 12/24/2017) 30 tablet 2  . gabapentin (NEURONTIN) 100 MG capsule Take 1 capsule (100 mg total) by mouth 3 (three) times daily. 90 capsule 1  . metoprolol tartrate (LOPRESSOR) 25 MG tablet Take 0.5 tablets (12.5 mg total) by mouth 2 (two) times daily. 30 tablet 0  . omeprazole (PRILOSEC) 40 MG capsule Take 1 capsule (40 mg total) by mouth daily. 30 capsule 1  . oxyCODONE-acetaminophen (PERCOCET/ROXICET) 5-325 MG tablet Take 1 tablet by mouth every 8 (eight) hours as needed for severe pain. (Patient not taking: Reported on 06/24/2018) 30 tablet 0  . polyethylene glycol (MIRALAX / GLYCOLAX) packet Take 17 g by mouth daily. (Patient not taking: Reported on 11/26/2017) 14 each 0  . prochlorperazine (COMPAZINE) 10 MG tablet Take 1 tablet (10 mg total) by mouth every 6 (six) hours as needed for nausea or vomiting. 30 tablet 0  . RELION GLUCOSE TEST STRIPS test strip USE 1 TO CHECK GLUCOSE ONCE DAILY    . ReliOn Ultra Thin Lancets 30G MISC USE 1 TO CHECK GLUCOSE ONCE DAILY FOR GLUCOSE TESTING    . traZODone (DESYREL) 50 MG tablet Take 1 tablet (50 mg total) by mouth at bedtime. (Patient not taking: Reported on 12/24/2017) 30 tablet 0   No current facility-administered medications for this visit.     SURGICAL HISTORY: No past  surgical history on file.  REVIEW OF SYSTEMS:  A comprehensive review of systems was negative except for: Constitutional: positive for fatigue Neurological: positive for paresthesia   PHYSICAL EXAMINATION: General appearance: alert, cooperative, fatigued and no distress Head: Normocephalic, without obvious abnormality, atraumatic Neck: no adenopathy, no JVD, supple, symmetrical, trachea midline and thyroid not enlarged, symmetric, no  tenderness/mass/nodules Lymph nodes: Cervical, supraclavicular, and axillary nodes normal. Resp: clear to auscultation bilaterally Back: symmetric, no curvature. ROM normal. No CVA tenderness. Cardio: regular rate and rhythm, S1, S2 normal, no murmur, click, rub or gallop GI: soft, non-tender; bowel sounds normal; no masses,  no organomegaly Extremities: extremities normal, atraumatic, no cyanosis or edema  ECOG PERFORMANCE STATUS: 1 - Symptomatic but completely ambulatory  Blood pressure 136/68, pulse 76, temperature 98 F (36.7 C), temperature source Oral, resp. rate 18, height _0  (1.727 m), weight 204 lb 8 oz (92.8 kg), SpO2 100 %.  LABORATORY DATA: Lab Results  Component Value Date   WBC 4.2 09/30/2018   HGB 12.7 09/30/2018   HCT 38.0 09/30/2018   MCV 92.9 09/30/2018   PLT 97 (L) 09/30/2018      Chemistry      Component Value Date/Time   NA 141 09/30/2018 0806   K 4.4 09/30/2018 0806   CL 105 09/30/2018 0806   CO2 25 09/30/2018 0806   BUN 17 09/30/2018 0806   CREATININE 1.30 (H) 09/30/2018 0806      Component Value Date/Time   CALCIUM 9.0 09/30/2018 0806   ALKPHOS 62 09/30/2018 0806   AST 16 09/30/2018 0806   ALT 14 09/30/2018 0806   BILITOT 0.4 09/30/2018 0806       RADIOGRAPHIC STUDIES: No results found.  ASSESSMENT AND PLAN: This is a very pleasant 68 years old African female originally from [place] who is visiting her son in [place] and was recently diagnosed with IgA multiple myeloma. The patient was a started initially on treatment with weekly subcutaneous Velcade 1.3 mg/M2, Revlimid 25 mg p.o. daily for 21 days every 4 weeks as well as weekly Decadron 40 mg orally.  Revlimid was discontinued secondary to hypersensitivity reaction with significant skin rash.  She is currently on treatment with weekly Velcade and Decadron. The patient has been tolerating this treatment well with no concerning complaints. I recommended for the patient to  continue her current treatment with Velcade and Decadron. I will see her back for follow-up visit in 4 weeks for evaluation with repeat myeloma panel. She was advised to call immediately if she has any concerning symptoms in the interval. The patient voices understanding of current disease status and treatment options and is in agreement with the current care plan. All questions were answered. The patient knows to call the clinic with any problems, questions or concerns. We can certainly see the patient much sooner if necessary.  Disclaimer: This note was dictated with voice recognition software. Similar sounding words can inadvertently be transcribed and may not be corrected upon review.

## 2018-10-01 ENCOUNTER — Telehealth: Payer: Self-pay | Admitting: Internal Medicine

## 2018-10-01 NOTE — Telephone Encounter (Signed)
Scheduled appt per 8/06 los - pt to get an updated schedule next visit.

## 2018-10-07 ENCOUNTER — Inpatient Hospital Stay: Payer: Self-pay

## 2018-10-07 ENCOUNTER — Other Ambulatory Visit: Payer: Self-pay

## 2018-10-07 VITALS — BP 126/76 | HR 73 | Temp 98.0°F | Resp 18

## 2018-10-07 DIAGNOSIS — C9000 Multiple myeloma not having achieved remission: Secondary | ICD-10-CM

## 2018-10-07 LAB — CMP (CANCER CENTER ONLY)
ALT: 13 U/L (ref 0–44)
AST: 18 U/L (ref 15–41)
Albumin: 3.4 g/dL — ABNORMAL LOW (ref 3.5–5.0)
Alkaline Phosphatase: 63 U/L (ref 38–126)
Anion gap: 12 (ref 5–15)
BUN: 16 mg/dL (ref 8–23)
CO2: 26 mmol/L (ref 22–32)
Calcium: 9.1 mg/dL (ref 8.9–10.3)
Chloride: 101 mmol/L (ref 98–111)
Creatinine: 1.39 mg/dL — ABNORMAL HIGH (ref 0.44–1.00)
GFR, Est AFR Am: 45 mL/min — ABNORMAL LOW (ref >60)
GFR, Estimated: 39 mL/min — ABNORMAL LOW (ref >60)
Glucose, Bld: 82 mg/dL (ref 70–99)
Potassium: 4.1 mmol/L (ref 3.5–5.1)
Sodium: 139 mmol/L (ref 135–145)
Total Bilirubin: 0.4 mg/dL (ref 0.3–1.2)
Total Protein: 6.7 g/dL (ref 6.5–8.1)

## 2018-10-07 LAB — CBC WITH DIFFERENTIAL (CANCER CENTER ONLY)
Abs Immature Granulocytes: 0.01 10*3/uL (ref 0.00–0.07)
Basophils Absolute: 0 10*3/uL (ref 0.0–0.1)
Basophils Relative: 1 %
Eosinophils Absolute: 0.1 10*3/uL (ref 0.0–0.5)
Eosinophils Relative: 1 %
HCT: 36.6 % (ref 36.0–46.0)
Hemoglobin: 12.3 g/dL (ref 12.0–15.0)
Immature Granulocytes: 0 %
Lymphocytes Relative: 27 %
Lymphs Abs: 1.2 10*3/uL (ref 0.7–4.0)
MCH: 31.1 pg (ref 26.0–34.0)
MCHC: 33.6 g/dL (ref 30.0–36.0)
MCV: 92.7 fL (ref 80.0–100.0)
Monocytes Absolute: 0.5 10*3/uL (ref 0.1–1.0)
Monocytes Relative: 12 %
Neutro Abs: 2.5 10*3/uL (ref 1.7–7.7)
Neutrophils Relative %: 59 %
Platelet Count: 102 10*3/uL — ABNORMAL LOW (ref 150–400)
RBC: 3.95 MIL/uL (ref 3.87–5.11)
RDW: 13 % (ref 11.5–15.5)
WBC Count: 4.3 10*3/uL (ref 4.0–10.5)
nRBC: 0 % (ref 0.0–0.2)

## 2018-10-07 MED ORDER — BORTEZOMIB CHEMO SQ INJECTION 3.5 MG (2.5MG/ML)
1.3000 mg/m2 | Freq: Once | INTRAMUSCULAR | Status: AC
  Administered 2018-10-07: 2.75 mg via SUBCUTANEOUS
  Filled 2018-10-07: qty 1.1

## 2018-10-07 MED ORDER — PROCHLORPERAZINE MALEATE 10 MG PO TABS: 10.0000 mg | ORAL_TABLET | Freq: Once | ORAL | Status: DC

## 2018-10-07 NOTE — Patient Instructions (Signed)
[facility] Discharge Instructions for Patients Receiving Chemotherapy  Today you received the following chemotherapy agents Velcade.  To help prevent nausea and vomiting after your treatment, we encourage you to take your nausea medication as directed.  If you develop nausea and vomiting that is not controlled by your nausea medication, call the clinic.   BELOW ARE SYMPTOMS THAT SHOULD BE REPORTED IMMEDIATELY:  *FEVER GREATER THAN 100.5 F  *CHILLS WITH OR WITHOUT FEVER  NAUSEA AND VOMITING THAT IS NOT CONTROLLED WITH YOUR NAUSEA MEDICATION  *UNUSUAL SHORTNESS OF BREATH  *UNUSUAL BRUISING OR BLEEDING  TENDERNESS IN MOUTH AND THROAT WITH OR WITHOUT PRESENCE OF ULCERS  *URINARY PROBLEMS  *BOWEL PROBLEMS  UNUSUAL RASH Items with * indicate a potential emergency and should be followed up as soon as possible.  Feel free to call the clinic should you have any questions or concerns. The clinic phone number is [phone].  Please show the CHEMO ALERT CARD at check-in to the Emergency Department and triage nurse.   

## 2018-10-14 ENCOUNTER — Inpatient Hospital Stay: Payer: Self-pay

## 2018-10-14 ENCOUNTER — Other Ambulatory Visit: Payer: Self-pay | Admitting: Medical Oncology

## 2018-10-14 ENCOUNTER — Other Ambulatory Visit: Payer: Self-pay

## 2018-10-14 ENCOUNTER — Telehealth: Payer: Self-pay | Admitting: Medical Oncology

## 2018-10-14 VITALS — BP 162/85 | HR 73 | Temp 98.3°F | Resp 17 | Wt 206.0 lb

## 2018-10-14 DIAGNOSIS — C9000 Multiple myeloma not having achieved remission: Secondary | ICD-10-CM

## 2018-10-14 DIAGNOSIS — K219 Gastro-esophageal reflux disease without esophagitis: Secondary | ICD-10-CM

## 2018-10-14 LAB — CBC WITH DIFFERENTIAL (CANCER CENTER ONLY)
Abs Immature Granulocytes: 0.01 10*3/uL (ref 0.00–0.07)
Basophils Absolute: 0 10*3/uL (ref 0.0–0.1)
Basophils Relative: 1 %
Eosinophils Absolute: 0.1 10*3/uL (ref 0.0–0.5)
Eosinophils Relative: 3 %
HCT: 37.9 % (ref 36.0–46.0)
Hemoglobin: 12.8 g/dL (ref 12.0–15.0)
Immature Granulocytes: 0 %
Lymphocytes Relative: 24 %
Lymphs Abs: 1 10*3/uL (ref 0.7–4.0)
MCH: 31.4 pg (ref 26.0–34.0)
MCHC: 33.8 g/dL (ref 30.0–36.0)
MCV: 92.9 fL (ref 80.0–100.0)
Monocytes Absolute: 0.4 10*3/uL (ref 0.1–1.0)
Monocytes Relative: 9 %
Neutro Abs: 2.7 10*3/uL (ref 1.7–7.7)
Neutrophils Relative %: 63 %
Platelet Count: 99 10*3/uL — ABNORMAL LOW (ref 150–400)
RBC: 4.08 MIL/uL (ref 3.87–5.11)
RDW: 13.1 % (ref 11.5–15.5)
WBC Count: 4.2 10*3/uL (ref 4.0–10.5)
nRBC: 0 % (ref 0.0–0.2)

## 2018-10-14 LAB — CMP (CANCER CENTER ONLY)
ALT: 13 U/L (ref 0–44)
AST: 18 U/L (ref 15–41)
Albumin: 3.5 g/dL (ref 3.5–5.0)
Alkaline Phosphatase: 70 U/L (ref 38–126)
Anion gap: 10 (ref 5–15)
BUN: 16 mg/dL (ref 8–23)
CO2: 26 mmol/L (ref 22–32)
Calcium: 9.2 mg/dL (ref 8.9–10.3)
Chloride: 104 mmol/L (ref 98–111)
Creatinine: 1.22 mg/dL — ABNORMAL HIGH (ref 0.44–1.00)
GFR, Est AFR Am: 53 mL/min — ABNORMAL LOW (ref >60)
GFR, Estimated: 45 mL/min — ABNORMAL LOW (ref >60)
Glucose, Bld: 84 mg/dL (ref 70–99)
Potassium: 4.3 mmol/L (ref 3.5–5.1)
Sodium: 140 mmol/L (ref 135–145)
Total Bilirubin: 0.4 mg/dL (ref 0.3–1.2)
Total Protein: 6.9 g/dL (ref 6.5–8.1)

## 2018-10-14 MED ORDER — DEXAMETHASONE 4 MG PO TABS: 40.0000 mg | ORAL_TABLET | ORAL | 3 refills | Status: DC

## 2018-10-14 MED ORDER — PROCHLORPERAZINE MALEATE 10 MG PO TABS: 10.0000 mg | ORAL_TABLET | Freq: Once | ORAL | Status: DC

## 2018-10-14 MED ORDER — BORTEZOMIB CHEMO SQ INJECTION 3.5 MG (2.5MG/ML)
1.3000 mg/m2 | Freq: Once | INTRAMUSCULAR | Status: AC
  Administered 2018-10-14: 2.75 mg via SUBCUTANEOUS
  Filled 2018-10-14: qty 1.1

## 2018-10-14 MED ORDER — OMEPRAZOLE 40 MG PO CPDR: 40.0000 mg | DELAYED_RELEASE_CAPSULE | Freq: Every day | ORAL | 2 refills | Status: DC

## 2018-10-14 MED FILL — DEXAMETHASONE 4 MG TABLET: 4 | 28 days supply | Qty: 40 | Fill #0

## 2018-10-14 MED FILL — METOPROLOL TARTRATE 25 MG T: 25 | 30 days supply | Qty: 30 | Fill #4

## 2018-10-14 MED FILL — OMEPRAZOLE 40 MG CPDR: 40 | 30 days supply | Qty: 30 | Fill #0

## 2018-10-14 NOTE — Telephone Encounter (Signed)
Dr Jeanie Cooks refill her Metoprolol in May with 5 refills. Pt needs to call [facility] for future refill. I called [facility] and they will get rx ready for her to pick up. ILVM about this to her daughter.

## 2018-10-14 NOTE — Patient Instructions (Signed)

## 2018-10-14 NOTE — Progress Notes (Signed)
Okay to treat with Plt 99 per Dr. Julien Nordmann.

## 2018-10-20 ENCOUNTER — Telehealth: Payer: Self-pay | Admitting: *Deleted

## 2018-10-20 NOTE — Telephone Encounter (Signed)
-----   Message from Lucile Crater, RN sent at 10/20/2018  9:56 AM EDT ----- Regarding: FW: refill metoprolol  ----- Message ----- From: Ardeen Garland, RN Sent: 10/19/2018   4:48 PM EDT To: Lucile Crater, RN, Otila Kluver, RN Subject: Melton Alar: refill metoprolol                          Will you let her daughter know this ----- Message ----- From: [name], MD Sent: 10/18/2018   8:59 PM EDT To: Ardeen Garland, RN Subject: RE: refill metoprolol                          Good evening, Thanks for reaching out to me. She has not been seen at my office in over a year. I will send a one time refill for her but she needs to be seen for further refills. Thank you,  [name], MD. ----- Message ----- From: Ardeen Garland, RN Sent: 10/14/2018  10:34 AM EDT To: [name], MD Subject: refill metoprolol                              She is asking for a refill .  Our PA filled it last month ,because she was out, but Julien Nordmann would like for you to handle refills from now on . Is that okay?   Thanks Diane

## 2018-10-20 NOTE — Telephone Encounter (Signed)
Notified daughter of message below. Verbalized understanding

## 2018-10-21 ENCOUNTER — Inpatient Hospital Stay: Payer: Self-pay

## 2018-10-21 ENCOUNTER — Other Ambulatory Visit: Payer: Self-pay

## 2018-10-21 VITALS — BP 136/82 | HR 72 | Temp 98.2°F | Resp 18

## 2018-10-21 DIAGNOSIS — C9000 Multiple myeloma not having achieved remission: Secondary | ICD-10-CM

## 2018-10-21 LAB — LACTATE DEHYDROGENASE: LDH: 177 U/L (ref 98–192)

## 2018-10-21 LAB — CMP (CANCER CENTER ONLY)
ALT: 12 U/L (ref 0–44)
AST: 18 U/L (ref 15–41)
Albumin: 3.6 g/dL (ref 3.5–5.0)
Alkaline Phosphatase: 70 U/L (ref 38–126)
Anion gap: 10 (ref 5–15)
BUN: 17 mg/dL (ref 8–23)
CO2: 26 mmol/L (ref 22–32)
Calcium: 8.8 mg/dL — ABNORMAL LOW (ref 8.9–10.3)
Chloride: 102 mmol/L (ref 98–111)
Creatinine: 1.48 mg/dL — ABNORMAL HIGH (ref 0.44–1.00)
GFR, Est AFR Am: 42 mL/min — ABNORMAL LOW (ref >60)
GFR, Estimated: 36 mL/min — ABNORMAL LOW (ref >60)
Glucose, Bld: 63 mg/dL — ABNORMAL LOW (ref 70–99)
Potassium: 4 mmol/L (ref 3.5–5.1)
Sodium: 138 mmol/L (ref 135–145)
Total Bilirubin: 0.4 mg/dL (ref 0.3–1.2)
Total Protein: 7.2 g/dL (ref 6.5–8.1)

## 2018-10-21 LAB — CBC WITH DIFFERENTIAL (CANCER CENTER ONLY)
Abs Immature Granulocytes: 0.02 10*3/uL (ref 0.00–0.07)
Basophils Absolute: 0 10*3/uL (ref 0.0–0.1)
Basophils Relative: 1 %
Eosinophils Absolute: 0.1 10*3/uL (ref 0.0–0.5)
Eosinophils Relative: 2 %
HCT: 38.3 % (ref 36.0–46.0)
Hemoglobin: 13 g/dL (ref 12.0–15.0)
Immature Granulocytes: 0 %
Lymphocytes Relative: 26 %
Lymphs Abs: 1.3 10*3/uL (ref 0.7–4.0)
MCH: 31.4 pg (ref 26.0–34.0)
MCHC: 33.9 g/dL (ref 30.0–36.0)
MCV: 92.5 fL (ref 80.0–100.0)
Monocytes Absolute: 0.5 10*3/uL (ref 0.1–1.0)
Monocytes Relative: 9 %
Neutro Abs: 3.1 10*3/uL (ref 1.7–7.7)
Neutrophils Relative %: 62 %
Platelet Count: 95 10*3/uL — ABNORMAL LOW (ref 150–400)
RBC: 4.14 MIL/uL (ref 3.87–5.11)
RDW: 13 % (ref 11.5–15.5)
WBC Count: 5 10*3/uL (ref 4.0–10.5)
nRBC: 0 % (ref 0.0–0.2)

## 2018-10-21 MED ORDER — PROCHLORPERAZINE MALEATE 10 MG PO TABS
ORAL_TABLET | ORAL | Status: AC
  Filled 2018-10-21: qty 1

## 2018-10-21 MED ORDER — BORTEZOMIB CHEMO SQ INJECTION 3.5 MG (2.5MG/ML)
1.3000 mg/m2 | Freq: Once | INTRAMUSCULAR | Status: AC
  Administered 2018-10-21: 2.75 mg via SUBCUTANEOUS
  Filled 2018-10-21: qty 1.1

## 2018-10-21 MED ORDER — PROCHLORPERAZINE MALEATE 10 MG PO TABS
10.0000 mg | ORAL_TABLET | Freq: Once | ORAL | Status: AC
  Administered 2018-10-21: 10 mg via ORAL

## 2018-10-21 NOTE — Progress Notes (Signed)
Platelets are 95 today - ok to treat per Dr. Julien Nordmann.

## 2018-10-21 NOTE — Patient Instructions (Signed)

## 2018-10-22 LAB — IGG, IGA, IGM
IgA: 1812 mg/dL — ABNORMAL HIGH (ref 87–352)
IgG (Immunoglobin G), Serum: 369 mg/dL — ABNORMAL LOW (ref 586–1602)
IgM (Immunoglobulin M), Srm: 8 mg/dL — ABNORMAL LOW (ref 26–217)

## 2018-10-22 LAB — KAPPA/LAMBDA LIGHT CHAINS
Kappa free light chain: 9.6 mg/L (ref 3.3–19.4)
Kappa, lambda light chain ratio: 0.02 — ABNORMAL LOW (ref 0.26–1.65)
Lambda free light chains: 551.7 mg/L — ABNORMAL HIGH (ref 5.7–26.3)

## 2018-10-22 LAB — BETA 2 MICROGLOBULIN, SERUM: Beta-2 Microglobulin: 2.6 mg/L — ABNORMAL HIGH (ref 0.6–2.4)

## 2018-10-22 MED FILL — ACYCLOVIR 200 MG CAP: 200 | 30 days supply | Qty: 60 | Fill #2

## 2018-10-28 ENCOUNTER — Inpatient Hospital Stay: Payer: Self-pay

## 2018-10-28 ENCOUNTER — Encounter: Payer: Self-pay | Admitting: Internal Medicine

## 2018-10-28 ENCOUNTER — Telehealth: Payer: Self-pay | Admitting: Internal Medicine

## 2018-10-28 ENCOUNTER — Inpatient Hospital Stay (HOSPITAL_BASED_OUTPATIENT_CLINIC_OR_DEPARTMENT_OTHER): Payer: Self-pay | Admitting: Internal Medicine

## 2018-10-28 ENCOUNTER — Inpatient Hospital Stay: Payer: Self-pay | Attending: Internal Medicine

## 2018-10-28 ENCOUNTER — Other Ambulatory Visit: Payer: Self-pay

## 2018-10-28 VITALS — BP 133/95 | HR 78 | Temp 98.2°F | Resp 20 | Ht 68.0 in | Wt 205.9 lb

## 2018-10-28 DIAGNOSIS — D649 Anemia, unspecified: Secondary | ICD-10-CM

## 2018-10-28 DIAGNOSIS — C9000 Multiple myeloma not having achieved remission: Secondary | ICD-10-CM

## 2018-10-28 DIAGNOSIS — M545 Low back pain: Secondary | ICD-10-CM | POA: Insufficient documentation

## 2018-10-28 DIAGNOSIS — G629 Polyneuropathy, unspecified: Secondary | ICD-10-CM | POA: Insufficient documentation

## 2018-10-28 DIAGNOSIS — R21 Rash and other nonspecific skin eruption: Secondary | ICD-10-CM | POA: Insufficient documentation

## 2018-10-28 DIAGNOSIS — Z79899 Other long term (current) drug therapy: Secondary | ICD-10-CM | POA: Insufficient documentation

## 2018-10-28 DIAGNOSIS — Z5111 Encounter for antineoplastic chemotherapy: Secondary | ICD-10-CM | POA: Insufficient documentation

## 2018-10-28 LAB — CBC WITH DIFFERENTIAL (CANCER CENTER ONLY)
Abs Immature Granulocytes: 0.02 10*3/uL (ref 0.00–0.07)
Basophils Absolute: 0 10*3/uL (ref 0.0–0.1)
Basophils Relative: 1 %
Eosinophils Absolute: 0.1 10*3/uL (ref 0.0–0.5)
Eosinophils Relative: 2 %
HCT: 38.6 % (ref 36.0–46.0)
Hemoglobin: 13 g/dL (ref 12.0–15.0)
Immature Granulocytes: 0 %
Lymphocytes Relative: 27 %
Lymphs Abs: 1.3 10*3/uL (ref 0.7–4.0)
MCH: 31.3 pg (ref 26.0–34.0)
MCHC: 33.7 g/dL (ref 30.0–36.0)
MCV: 92.8 fL (ref 80.0–100.0)
Monocytes Absolute: 0.4 10*3/uL (ref 0.1–1.0)
Monocytes Relative: 8 %
Neutro Abs: 3 10*3/uL (ref 1.7–7.7)
Neutrophils Relative %: 62 %
Platelet Count: 103 10*3/uL — ABNORMAL LOW (ref 150–400)
RBC: 4.16 MIL/uL (ref 3.87–5.11)
RDW: 13.1 % (ref 11.5–15.5)
WBC Count: 4.8 10*3/uL (ref 4.0–10.5)
nRBC: 0 % (ref 0.0–0.2)

## 2018-10-28 LAB — CMP (CANCER CENTER ONLY)
ALT: 11 U/L (ref 0–44)
AST: 17 U/L (ref 15–41)
Albumin: 3.6 g/dL (ref 3.5–5.0)
Alkaline Phosphatase: 71 U/L (ref 38–126)
Anion gap: 10 (ref 5–15)
BUN: 13 mg/dL (ref 8–23)
CO2: 24 mmol/L (ref 22–32)
Calcium: 8.8 mg/dL — ABNORMAL LOW (ref 8.9–10.3)
Chloride: 105 mmol/L (ref 98–111)
Creatinine: 1.24 mg/dL — ABNORMAL HIGH (ref 0.44–1.00)
GFR, Est AFR Am: 52 mL/min — ABNORMAL LOW (ref >60)
GFR, Estimated: 45 mL/min — ABNORMAL LOW (ref >60)
Glucose, Bld: 86 mg/dL (ref 70–99)
Potassium: 4 mmol/L (ref 3.5–5.1)
Sodium: 139 mmol/L (ref 135–145)
Total Bilirubin: 0.4 mg/dL (ref 0.3–1.2)
Total Protein: 7.1 g/dL (ref 6.5–8.1)

## 2018-10-28 MED ORDER — PROCHLORPERAZINE MALEATE 10 MG PO TABS
ORAL_TABLET | ORAL | Status: AC
  Filled 2018-10-28: qty 1

## 2018-10-28 MED ORDER — BORTEZOMIB CHEMO SQ INJECTION 3.5 MG (2.5MG/ML)
1.3000 mg/m2 | Freq: Once | INTRAMUSCULAR | Status: AC
  Administered 2018-10-28: 10:00:00 2.75 mg via SUBCUTANEOUS
  Filled 2018-10-28: qty 1.1

## 2018-10-28 MED ORDER — PROCHLORPERAZINE MALEATE 10 MG PO TABS
10.0000 mg | ORAL_TABLET | Freq: Once | ORAL | Status: AC
  Administered 2018-10-28: 10:00:00 10 mg via ORAL

## 2018-10-28 NOTE — Progress Notes (Signed)
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  PRIOR THERAPY: None  CURRENT THERAPY: Systemic therapy with Velcade 1.3 mg/M2 weekly, Revlimid 25 mg p.o. daily for 14 days every 3 weeks in addition to weekly Decadron 40 mg orally. First dose of treatment 10/08/2017.Treatment was placed on hold due to development of a significant rash.Sheresumedtreatment with dexamethasone and Velcade on 11/19/2017. She will not resume treatment of Revlimid due to the rash.  Status post 10 months of treatment.  INTERVAL HISTORY: Debbie Chan 68 y.o. female returns to the clinic today for follow-up visit.  The patient is feeling fine today with no concerning complaints except for mild low back pain as well as a peripheral neuropathy.  She is currently on gabapentin.  She also has some eye itching.  She denied having any chest pain, shortness of breath, cough or hemoptysis.  She denied having any fever or chills.  She has no nausea, diarrhea or constipation.  She continues to tolerate her treatment with Velcade and Decadron fairly well.  The patient had repeat myeloma panel performed recently and she is here for evaluation and discussion of her lab results and treatment options.  MEDICAL HISTORY:No past medical history on file.  ALLERGIES:  has No Known Allergies.  MEDICATIONS:  Current Outpatient Medications  Medication Sig Dispense Refill  . acyclovir (ZOVIRAX) 200 MG capsule TAKE 1 CAPSULE BY MOUTH 2 TIMES DAILY. 60 capsule 2  . blood glucose meter kit and supplies Dispense based on patient and insurance preference. Use up to four times daily as directed. (FOR ICD-10 E10.9, E11.9). 1 each 0  . dexamethasone (DECADRON) 4 MG tablet Take 10 tablets (40 mg total) by mouth every 7 (seven) days. 40 tablet 3  . diphenhydrAMINE  (BENADRYL ALLERGY) 25 MG tablet Take 1 tablet (25 mg total) by mouth every 6 (six) hours as needed for itching. (Patient not taking: Reported on 11/26/2017) 100 tablet 0  . furosemide (LASIX) 20 MG tablet Take once daily for 3 days for lower extremity edema, then stop. Repeat as needed for recurrent edema. (Patient not taking: Reported on 12/24/2017) 30 tablet 2  . gabapentin (NEURONTIN) 100 MG capsule Take 1 capsule (100 mg total) by mouth 3 (three) times daily. 90 capsule 1  . metoprolol tartrate (LOPRESSOR) 25 MG tablet Take 0.5 tablets (12.5 mg total) by mouth 2 (two) times daily. 30 tablet 0  . omeprazole (PRILOSEC) 40 MG capsule Take 1 capsule (40 mg total) by mouth daily. 30 capsule 2  . oxyCODONE-acetaminophen (PERCOCET/ROXICET) 5-325 MG tablet Take 1 tablet by mouth every 8 (eight) hours as needed for severe pain. (Patient not taking: Reported on 06/24/2018) 30 tablet 0  . polyethylene glycol (MIRALAX / GLYCOLAX) packet Take 17 g by mouth daily. (Patient not taking: Reported on 11/26/2017) 14 each 0  . prochlorperazine (COMPAZINE) 10 MG tablet Take 1 tablet (10 mg total) by mouth every 6 (six) hours as needed for nausea or vomiting. 30 tablet 0  . RELION GLUCOSE TEST STRIPS test strip USE 1 TO CHECK GLUCOSE ONCE DAILY    . ReliOn Ultra Thin Lancets 30G MISC USE 1 TO CHECK GLUCOSE ONCE DAILY FOR GLUCOSE TESTING    . traZODone (DESYREL) 50 MG tablet Take 1 tablet (50 mg total) by mouth at bedtime. (Patient not taking: Reported on 12/24/2017) 30 tablet  0   No current facility-administered medications for this visit.     SURGICAL HISTORY: No past surgical history on file.  REVIEW OF SYSTEMS:  Constitutional: positive for fatigue Eyes: negative Ears, nose, mouth, throat, and face: negative Respiratory: negative Cardiovascular: negative Gastrointestinal: negative Genitourinary:negative Integument/breast: negative Hematologic/lymphatic: negative Musculoskeletal:positive for back pain  Neurological: positive for paresthesia Behavioral/Psych: negative Endocrine: negative Allergic/Immunologic: negative   PHYSICAL EXAMINATION: General appearance: alert, cooperative, fatigued and no distress Head: Normocephalic, without obvious abnormality, atraumatic Neck: no adenopathy, no JVD, supple, symmetrical, trachea midline and thyroid not enlarged, symmetric, no tenderness/mass/nodules Lymph nodes: Cervical, supraclavicular, and axillary nodes normal. Resp: clear to auscultation bilaterally Back: symmetric, no curvature. ROM normal. No CVA tenderness. Cardio: regular rate and rhythm, S1, S2 normal, no murmur, click, rub or gallop GI: soft, non-tender; bowel sounds normal; no masses,  no organomegaly Extremities: extremities normal, atraumatic, no cyanosis or edema Neurologic: Alert and oriented X 3, normal strength and tone. Normal symmetric reflexes. Normal coordination and gait  ECOG PERFORMANCE STATUS: 1 - Symptomatic but completely ambulatory  Blood pressure (!) 133/95, pulse 78, temperature 98.2 F (36.8 C), temperature source Oral, resp. rate 20, height '5\' 8"'$  (1.727 m), weight 205 lb 14.4 oz (93.4 kg), SpO2 100 %.  LABORATORY DATA: Lab Results  Component Value Date   WBC 4.8 10/28/2018   HGB 13.0 10/28/2018   HCT 38.6 10/28/2018   MCV 92.8 10/28/2018   PLT 103 (L) 10/28/2018      Chemistry      Component Value Date/Time   NA 138 10/21/2018 0804   K 4.0 10/21/2018 0804   CL 102 10/21/2018 0804   CO2 26 10/21/2018 0804   BUN 17 10/21/2018 0804   CREATININE 1.48 (H) 10/21/2018 0804      Component Value Date/Time   CALCIUM 8.8 (L) 10/21/2018 0804   ALKPHOS 70 10/21/2018 0804   AST 18 10/21/2018 0804   ALT 12 10/21/2018 0804   BILITOT 0.4 10/21/2018 0804       RADIOGRAPHIC STUDIES: No results found.  ASSESSMENT AND PLAN: This is a very pleasant 68 years old African female originally from [place] who is visiting her son in [place] and  was recently diagnosed with IgA multiple myeloma. The patient was a started initially on treatment with weekly subcutaneous Velcade 1.3 mg/M2, Revlimid 25 mg p.o. daily for 21 days every 4 weeks as well as weekly Decadron 40 mg orally.  Revlimid was discontinued secondary to hypersensitivity reaction with significant skin rash.  She is currently on treatment with weekly Velcade and Decadron. She continues to tolerate this treatment well with no concerning adverse effects except for the fatigue and peripheral neuropathy. The patient had repeat myeloma panel performed recently.  I reviewed the lab results with the patient today. Her myeloma panel showed no significant increase compared to 3 months ago. I recommended for her to continue on her current treatment with Velcade and Decadron. I will see her back for follow-up visit in 4 weeks for evaluation. She will continue on gabapentin for the peripheral neuropathy and back pain. She was advised to call immediately if she has any other concerning symptoms in the interval. The patient voices understanding of current disease status and treatment options and is in agreement with the current care plan. All questions were answered. The patient knows to call the clinic with any problems, questions or concerns. We can certainly see the patient much sooner if necessary.  Disclaimer: This note was dictated with  voice recognition software. Similar sounding words can inadvertently be transcribed and may not be corrected upon review.

## 2018-10-28 NOTE — Telephone Encounter (Signed)
Gave avs and calendar ° °

## 2018-10-28 NOTE — Patient Instructions (Signed)
[facility] Discharge Instructions for Patients Receiving Chemotherapy  Today you received the following chemotherapy agents Velcade.  To help prevent nausea and vomiting after your treatment, we encourage you to take your nausea medication as directed.  If you develop nausea and vomiting that is not controlled by your nausea medication, call the clinic.   BELOW ARE SYMPTOMS THAT SHOULD BE REPORTED IMMEDIATELY:  *FEVER GREATER THAN 100.5 F  *CHILLS WITH OR WITHOUT FEVER  NAUSEA AND VOMITING THAT IS NOT CONTROLLED WITH YOUR NAUSEA MEDICATION  *UNUSUAL SHORTNESS OF BREATH  *UNUSUAL BRUISING OR BLEEDING  TENDERNESS IN MOUTH AND THROAT WITH OR WITHOUT PRESENCE OF ULCERS  *URINARY PROBLEMS  *BOWEL PROBLEMS  UNUSUAL RASH Items with * indicate a potential emergency and should be followed up as soon as possible.  Feel free to call the clinic should you have any questions or concerns. The clinic phone number is [phone].  Please show the CHEMO ALERT CARD at check-in to the Emergency Department and triage nurse.   

## 2018-11-04 ENCOUNTER — Inpatient Hospital Stay: Payer: Self-pay

## 2018-11-04 ENCOUNTER — Other Ambulatory Visit: Payer: Self-pay

## 2018-11-04 VITALS — BP 144/83 | HR 77 | Temp 98.7°F | Resp 16 | Wt 205.8 lb

## 2018-11-04 DIAGNOSIS — C9000 Multiple myeloma not having achieved remission: Secondary | ICD-10-CM

## 2018-11-04 LAB — CBC WITH DIFFERENTIAL (CANCER CENTER ONLY)
Abs Immature Granulocytes: 0.01 10*3/uL (ref 0.00–0.07)
Basophils Absolute: 0 10*3/uL (ref 0.0–0.1)
Basophils Relative: 1 %
Eosinophils Absolute: 0.1 10*3/uL (ref 0.0–0.5)
Eosinophils Relative: 2 %
HCT: 38.9 % (ref 36.0–46.0)
Hemoglobin: 13.2 g/dL (ref 12.0–15.0)
Immature Granulocytes: 0 %
Lymphocytes Relative: 20 %
Lymphs Abs: 0.9 10*3/uL (ref 0.7–4.0)
MCH: 31.4 pg (ref 26.0–34.0)
MCHC: 33.9 g/dL (ref 30.0–36.0)
MCV: 92.6 fL (ref 80.0–100.0)
Monocytes Absolute: 0.3 10*3/uL (ref 0.1–1.0)
Monocytes Relative: 6 %
Neutro Abs: 3.2 10*3/uL (ref 1.7–7.7)
Neutrophils Relative %: 71 %
Platelet Count: 98 10*3/uL — ABNORMAL LOW (ref 150–400)
RBC: 4.2 MIL/uL (ref 3.87–5.11)
RDW: 13.1 % (ref 11.5–15.5)
WBC Count: 4.4 10*3/uL (ref 4.0–10.5)
nRBC: 0 % (ref 0.0–0.2)

## 2018-11-04 LAB — CMP (CANCER CENTER ONLY)
ALT: 10 U/L (ref 0–44)
AST: 18 U/L (ref 15–41)
Albumin: 3.8 g/dL (ref 3.5–5.0)
Alkaline Phosphatase: 67 U/L (ref 38–126)
Anion gap: 10 (ref 5–15)
BUN: 20 mg/dL (ref 8–23)
CO2: 26 mmol/L (ref 22–32)
Calcium: 9.1 mg/dL (ref 8.9–10.3)
Chloride: 106 mmol/L (ref 98–111)
Creatinine: 1.44 mg/dL — ABNORMAL HIGH (ref 0.44–1.00)
GFR, Est AFR Am: 43 mL/min — ABNORMAL LOW (ref >60)
GFR, Estimated: 37 mL/min — ABNORMAL LOW (ref >60)
Glucose, Bld: 95 mg/dL (ref 70–99)
Potassium: 3.9 mmol/L (ref 3.5–5.1)
Sodium: 142 mmol/L (ref 135–145)
Total Bilirubin: 0.4 mg/dL (ref 0.3–1.2)
Total Protein: 7.1 g/dL (ref 6.5–8.1)

## 2018-11-04 MED ORDER — PROCHLORPERAZINE MALEATE 10 MG PO TABS
10.0000 mg | ORAL_TABLET | Freq: Once | ORAL | Status: AC
  Administered 2018-11-04: 10 mg via ORAL

## 2018-11-04 MED ORDER — PROCHLORPERAZINE MALEATE 10 MG PO TABS
ORAL_TABLET | ORAL | Status: AC
  Filled 2018-11-04: qty 1

## 2018-11-04 MED ORDER — BORTEZOMIB CHEMO SQ INJECTION 3.5 MG (2.5MG/ML)
1.3000 mg/m2 | Freq: Once | INTRAMUSCULAR | Status: AC
  Administered 2018-11-04: 10:00:00 2.75 mg via SUBCUTANEOUS
  Filled 2018-11-04: qty 1.1

## 2018-11-04 NOTE — Progress Notes (Signed)
OK to treat with platelet count of 98K per Dr. Julien Nordmann

## 2018-11-04 NOTE — Patient Instructions (Addendum)
[facility] Discharge Instructions for Patients Receiving Chemotherapy  Today you received the following chemotherapy agents Velcade  To help prevent nausea and vomiting after your treatment, we encourage you to take your nausea medication as directed by your MD.   If you develop nausea and vomiting that is not controlled by your nausea medication, call the clinic.   BELOW ARE SYMPTOMS THAT SHOULD BE REPORTED IMMEDIATELY:  *FEVER GREATER THAN 100.5 F  *CHILLS WITH OR WITHOUT FEVER  NAUSEA AND VOMITING THAT IS NOT CONTROLLED WITH YOUR NAUSEA MEDICATION  *UNUSUAL SHORTNESS OF BREATH  *UNUSUAL BRUISING OR BLEEDING  TENDERNESS IN MOUTH AND THROAT WITH OR WITHOUT PRESENCE OF ULCERS  *URINARY PROBLEMS  *BOWEL PROBLEMS  UNUSUAL RASH Items with * indicate a potential emergency and should be followed up as soon as possible.  Feel free to call the clinic should you have any questions or concerns. The clinic phone number is [phone].  Please show the [place] at check-in to the Emergency Department and triage nurse. Coronavirus (COVID-19) Are you at risk?  Are you at risk for the Coronavirus (COVID-19)?  To be considered HIGH RISK for Coronavirus (COVID-19), you have to meet the following criteria:  . Traveled to [place], [place], [place], [place] or [place]; or in the [place] to [place], [place], [place], or [place]; and have fever, cough, and shortness of breath within the last 2 weeks of travel OR . Been in close contact with a person diagnosed with COVID-19 within the last 2 weeks and have fever, cough, and shortness of breath . IF YOU DO NOT MEET THESE CRITERIA, YOU ARE CONSIDERED LOW RISK FOR COVID-19.  What to do if you are HIGH RISK for COVID-19?  Marland Kitchen If you are having a medical emergency, call 911. . Seek medical care right away. Before you go to a doctor's office, urgent care or emergency department, call ahead and tell them about  your recent travel, contact with someone diagnosed with COVID-19, and your symptoms. You should receive instructions from your physician's office regarding next steps of care.  . When you arrive at healthcare provider, tell the healthcare staff immediately you have returned from visiting [place], [place], [place], [place] or [place]; or traveled in the [place] to [place], [place], [place], or [place]; in the last two weeks or you have been in close contact with a person diagnosed with COVID-19 in the last 2 weeks.   . Tell the health care staff about your symptoms: fever, cough and shortness of breath. . After you have been seen by a medical provider, you will be either: o Tested for (COVID-19) and discharged home on quarantine except to seek medical care if symptoms worsen, and asked to  - Stay home and avoid contact with others until you get your results (4-5 days)  - Avoid travel on public transportation if possible (such as bus, train, or airplane) or o Sent to the Emergency Department by EMS for evaluation, COVID-19 testing, and possible admission depending on your condition and test results.  What to do if you are LOW RISK for COVID-19?  Reduce your risk of any infection by using the same precautions used for avoiding the common cold or flu:  Marland Kitchen Wash your hands often with soap and warm water for at least 20 seconds.  If soap and water are not readily available, use an alcohol-based hand sanitizer with at least 60% alcohol.  . If coughing  or sneezing, cover your mouth and nose by coughing or sneezing into the elbow areas of your shirt or coat, into a tissue or into your sleeve (not your hands). . Avoid shaking hands with others and consider head nods or verbal greetings only. . Avoid touching your eyes, nose, or mouth with unwashed hands.  . Avoid close contact with people who are sick. . Avoid places or events with large numbers of people in one location, like concerts or sporting  events. . Carefully consider travel plans you have or are making. . If you are planning any travel outside or inside the [place], visit the CDC's Travelers' Health webpage for the latest health notices. . If you have some symptoms but not all symptoms, continue to monitor at home and seek medical attention if your symptoms worsen. . If you are having a medical emergency, call 911.   [place] / e-Visit: [email or web address]         [facility]: [facility]: [id number]                   [facility]: [id number]    Thrombocytopenia Thrombocytopenia means that you have a low number of platelets in your blood. Platelets are tiny cells in the blood. When you bleed, they clump together at the cut or injury to stop the bleeding. This is called blood clotting. If you do not have enough platelets, it can cause bleeding problems. Some cases of this condition are mild while others are more severe. What are the causes? This condition may be caused by:  Your body not making enough platelets. This may be caused by: ? Your bone marrow not making blood cells (aplastic anemia). ? Cancer in the bone marrow. ? Certain medicines. ? Infection in the bone marrow. ? Drinking a lot of alcohol.  Your body destroying platelets too quickly. This may be caused by: ? Certain immune diseases. ? Certain medicines. ? Certain blood clotting disorders. ? Certain disorders that are passed from parent to child (inherited). ? Certain bleeding disorders. ? Pregnancy. ? Having a spleen that is larger than normal. What are the signs or symptoms?  Bleeding that is not normal.  Nosebleeds.  Heavy menstrual periods.  Blood in the pee (urine) or poop (stool).  A purple-like color to the skin (purpura).  Bruising.  A rash that looks like pinpoint, purple-red  spots (petechiae). How is this treated?  Treatment of another condition that is causing the low platelet count.  Medicines to help protect your platelets from being destroyed.  A replacement (transfusion) of platelets to stop or prevent bleeding.  Surgery to remove the spleen. Follow these instructions at home: Activity  Avoid activities that could cause you to get hurt or bruised. Follow instructions about how to prevent falls.  Take care not to cut yourself: ? When you shave. ? When you use scissors, needles, knives, or other tools.  Take care not to burn yourself: ? When you use an iron. ? When you cook. General instructions   Check your skin and the inside of your mouth for bruises or blood as told by your doctor.  Check to see if there is blood in your spit (sputum), pee, and poop. Do this as told by your doctor.  Do not drink alcohol.  Take over-the-counter and prescription medicines only as told by your doctor.  Do not take any medicines that have  aspirin or NSAIDs in them. These medicines can thin your blood and cause you to bleed.  Tell all of your doctors that you have this condition. Be sure to tell your dentist and eye doctor too. Contact a doctor if:  You have bruises and you do not know why. Get help right away if:  You are bleeding anywhere on your body.  You have blood in your spit, pee, or poop. Summary  Thrombocytopenia means that you have a low number of platelets in your blood.  Platelets are needed for blood clotting.  Symptoms of this condition include bleeding that is not normal, and bruising.  Take care not to cut or burn yourself. This information is not intended to replace advice given to you by your health care provider. Make sure you discuss any questions you have with your health care provider. Document Released: 01/30/2011 Document Revised: 11/12/2017 Document Reviewed: 11/12/2017 Elsevier Patient Education  2020 [employer].

## 2018-11-11 ENCOUNTER — Inpatient Hospital Stay: Payer: Self-pay

## 2018-11-11 ENCOUNTER — Other Ambulatory Visit: Payer: Self-pay | Admitting: *Deleted

## 2018-11-11 ENCOUNTER — Other Ambulatory Visit: Payer: Self-pay

## 2018-11-11 VITALS — BP 136/67 | HR 74 | Temp 97.6°F | Resp 18 | Wt 205.8 lb

## 2018-11-11 DIAGNOSIS — K219 Gastro-esophageal reflux disease without esophagitis: Secondary | ICD-10-CM

## 2018-11-11 DIAGNOSIS — C9000 Multiple myeloma not having achieved remission: Secondary | ICD-10-CM

## 2018-11-11 LAB — CBC WITH DIFFERENTIAL (CANCER CENTER ONLY)
Abs Immature Granulocytes: 0.01 10*3/uL (ref 0.00–0.07)
Basophils Absolute: 0 10*3/uL (ref 0.0–0.1)
Basophils Relative: 0 %
Eosinophils Absolute: 0.1 10*3/uL (ref 0.0–0.5)
Eosinophils Relative: 1 %
HCT: 38.3 % (ref 36.0–46.0)
Hemoglobin: 13 g/dL (ref 12.0–15.0)
Immature Granulocytes: 0 %
Lymphocytes Relative: 15 %
Lymphs Abs: 0.8 10*3/uL (ref 0.7–4.0)
MCH: 31.2 pg (ref 26.0–34.0)
MCHC: 33.9 g/dL (ref 30.0–36.0)
MCV: 91.8 fL (ref 80.0–100.0)
Monocytes Absolute: 0.3 10*3/uL (ref 0.1–1.0)
Monocytes Relative: 6 %
Neutro Abs: 4.3 10*3/uL (ref 1.7–7.7)
Neutrophils Relative %: 78 %
Platelet Count: 111 10*3/uL — ABNORMAL LOW (ref 150–400)
RBC: 4.17 MIL/uL (ref 3.87–5.11)
RDW: 13.2 % (ref 11.5–15.5)
WBC Count: 5.5 10*3/uL (ref 4.0–10.5)
nRBC: 0 % (ref 0.0–0.2)

## 2018-11-11 LAB — CMP (CANCER CENTER ONLY)
ALT: 11 U/L (ref 0–44)
AST: 19 U/L (ref 15–41)
Albumin: 3.8 g/dL (ref 3.5–5.0)
Alkaline Phosphatase: 64 U/L (ref 38–126)
Anion gap: 9 (ref 5–15)
BUN: 16 mg/dL (ref 8–23)
CO2: 28 mmol/L (ref 22–32)
Calcium: 8.9 mg/dL (ref 8.9–10.3)
Chloride: 102 mmol/L (ref 98–111)
Creatinine: 1.43 mg/dL — ABNORMAL HIGH (ref 0.44–1.00)
GFR, Est AFR Am: 44 mL/min — ABNORMAL LOW (ref >60)
GFR, Estimated: 38 mL/min — ABNORMAL LOW (ref >60)
Glucose, Bld: 80 mg/dL (ref 70–99)
Potassium: 4.1 mmol/L (ref 3.5–5.1)
Sodium: 139 mmol/L (ref 135–145)
Total Bilirubin: 0.4 mg/dL (ref 0.3–1.2)
Total Protein: 7.2 g/dL (ref 6.5–8.1)

## 2018-11-11 MED ORDER — PROCHLORPERAZINE MALEATE 10 MG PO TABS
10.0000 mg | ORAL_TABLET | Freq: Once | ORAL | Status: AC
  Administered 2018-11-11: 09:00:00 10 mg via ORAL

## 2018-11-11 MED ORDER — OMEPRAZOLE 40 MG PO CPDR: 40.0000 mg | DELAYED_RELEASE_CAPSULE | Freq: Every day | ORAL | 2 refills | Status: DC

## 2018-11-11 MED ORDER — BORTEZOMIB CHEMO SQ INJECTION 3.5 MG (2.5MG/ML)
1.3000 mg/m2 | Freq: Once | INTRAMUSCULAR | Status: AC
  Administered 2018-11-11: 2.75 mg via SUBCUTANEOUS
  Filled 2018-11-11: qty 1.1

## 2018-11-11 MED ORDER — ACYCLOVIR 200 MG PO CAPS: ORAL_CAPSULE | ORAL | 2 refills | Status: DC

## 2018-11-11 MED ORDER — PROCHLORPERAZINE MALEATE 10 MG PO TABS
ORAL_TABLET | ORAL | Status: AC
  Filled 2018-11-11: qty 1

## 2018-11-11 MED ORDER — DEXAMETHASONE 4 MG PO TABS: 40.0000 mg | ORAL_TABLET | ORAL | 3 refills | Status: DC

## 2018-11-11 MED FILL — DEXAMETHASONE 4 MG TABLET: 4 | 28 days supply | Qty: 40 | Fill #0

## 2018-11-11 MED FILL — OMEPRAZOLE 40 MG CPDR: 40 | 30 days supply | Qty: 30 | Fill #0

## 2018-11-11 MED FILL — ACYCLOVIR 200 MG CAP: 200 | 30 days supply | Qty: 60 | Fill #0

## 2018-11-11 NOTE — Patient Instructions (Signed)

## 2018-11-18 ENCOUNTER — Inpatient Hospital Stay: Payer: Self-pay

## 2018-11-18 ENCOUNTER — Other Ambulatory Visit: Payer: Self-pay

## 2018-11-18 VITALS — BP 135/72 | HR 77 | Temp 97.4°F | Resp 18

## 2018-11-18 DIAGNOSIS — C9000 Multiple myeloma not having achieved remission: Secondary | ICD-10-CM

## 2018-11-18 LAB — CMP (CANCER CENTER ONLY)
ALT: 12 U/L (ref 0–44)
AST: 17 U/L (ref 15–41)
Albumin: 3.7 g/dL (ref 3.5–5.0)
Alkaline Phosphatase: 69 U/L (ref 38–126)
Anion gap: 9 (ref 5–15)
BUN: 12 mg/dL (ref 8–23)
CO2: 27 mmol/L (ref 22–32)
Calcium: 8.9 mg/dL (ref 8.9–10.3)
Chloride: 104 mmol/L (ref 98–111)
Creatinine: 1.28 mg/dL — ABNORMAL HIGH (ref 0.44–1.00)
GFR, Est AFR Am: 50 mL/min — ABNORMAL LOW (ref >60)
GFR, Estimated: 43 mL/min — ABNORMAL LOW (ref >60)
Glucose, Bld: 72 mg/dL (ref 70–99)
Potassium: 4.2 mmol/L (ref 3.5–5.1)
Sodium: 140 mmol/L (ref 135–145)
Total Bilirubin: 0.4 mg/dL (ref 0.3–1.2)
Total Protein: 7.2 g/dL (ref 6.5–8.1)

## 2018-11-18 LAB — CBC WITH DIFFERENTIAL (CANCER CENTER ONLY)
Abs Immature Granulocytes: 0.01 10*3/uL (ref 0.00–0.07)
Basophils Absolute: 0 10*3/uL (ref 0.0–0.1)
Basophils Relative: 0 %
Eosinophils Absolute: 0.1 10*3/uL (ref 0.0–0.5)
Eosinophils Relative: 2 %
HCT: 40.7 % (ref 36.0–46.0)
Hemoglobin: 13.6 g/dL (ref 12.0–15.0)
Immature Granulocytes: 0 %
Lymphocytes Relative: 20 %
Lymphs Abs: 1 10*3/uL (ref 0.7–4.0)
MCH: 31.1 pg (ref 26.0–34.0)
MCHC: 33.4 g/dL (ref 30.0–36.0)
MCV: 92.9 fL (ref 80.0–100.0)
Monocytes Absolute: 0.3 10*3/uL (ref 0.1–1.0)
Monocytes Relative: 6 %
Neutro Abs: 3.7 10*3/uL (ref 1.7–7.7)
Neutrophils Relative %: 72 %
Platelet Count: 104 10*3/uL — ABNORMAL LOW (ref 150–400)
RBC: 4.38 MIL/uL (ref 3.87–5.11)
RDW: 13.1 % (ref 11.5–15.5)
WBC Count: 5.1 10*3/uL (ref 4.0–10.5)
nRBC: 0 % (ref 0.0–0.2)

## 2018-11-18 MED ORDER — BORTEZOMIB CHEMO SQ INJECTION 3.5 MG (2.5MG/ML)
1.3000 mg/m2 | Freq: Once | INTRAMUSCULAR | Status: AC
  Administered 2018-11-18: 10:00:00 2.75 mg via SUBCUTANEOUS
  Filled 2018-11-18: qty 1.1

## 2018-11-18 MED ORDER — PROCHLORPERAZINE MALEATE 10 MG PO TABS
10.0000 mg | ORAL_TABLET | Freq: Once | ORAL | Status: AC
  Administered 2018-11-18: 10:00:00 10 mg via ORAL

## 2018-11-18 MED ORDER — PROCHLORPERAZINE MALEATE 10 MG PO TABS
ORAL_TABLET | ORAL | Status: AC
  Filled 2018-11-18: qty 1

## 2018-11-18 NOTE — Patient Instructions (Signed)
Coronavirus (COVID-19) Are you at risk?  Are you at risk for the Coronavirus (COVID-19)?  To be considered HIGH RISK for Coronavirus (COVID-19), you have to meet the following criteria:  . Traveled to [place], [place], [place], [place] or [place]; or in the [place] to [place], [place], [place], or [place]; and have fever, cough, and shortness of breath within the last 2 weeks of travel OR . Been in close contact with a person diagnosed with COVID-19 within the last 2 weeks and have fever, cough, and shortness of breath . IF YOU DO NOT MEET THESE CRITERIA, YOU ARE CONSIDERED LOW RISK FOR COVID-19.  What to do if you are HIGH RISK for COVID-19?  . If you are having a medical emergency, call 911. . Seek medical care right away. Before you go to a doctor's office, urgent care or emergency department, call ahead and tell them about your recent travel, contact with someone diagnosed with COVID-19, and your symptoms. You should receive instructions from your physician's office regarding next steps of care.  . When you arrive at healthcare provider, tell the healthcare staff immediately you have returned from visiting [place], [place], [place], [place] or [place]; or traveled in the [place] to [place], [place], [place], or [place]; in the last two weeks or you have been in close contact with a person diagnosed with COVID-19 in the last 2 weeks.   . Tell the health care staff about your symptoms: fever, cough and shortness of breath. . After you have been seen by a medical provider, you will be either: o Tested for (COVID-19) and discharged home on quarantine except to seek medical care if symptoms worsen, and asked to  - Stay home and avoid contact with others until you get your results (4-5 days)  - Avoid travel on public transportation if possible (such as bus, train, or airplane) or o Sent to the Emergency Department by EMS for evaluation, COVID-19 testing, and possible  admission depending on your condition and test results.  What to do if you are LOW RISK for COVID-19?  Reduce your risk of any infection by using the same precautions used for avoiding the common cold or flu:  . Wash your hands often with soap and warm water for at least 20 seconds.  If soap and water are not readily available, use an alcohol-based hand sanitizer with at least 60% alcohol.  . If coughing or sneezing, cover your mouth and nose by coughing or sneezing into the elbow areas of your shirt or coat, into a tissue or into your sleeve (not your hands). . Avoid shaking hands with others and consider head nods or verbal greetings only. . Avoid touching your eyes, nose, or mouth with unwashed hands.  . Avoid close contact with people who are sick. . Avoid places or events with large numbers of people in one location, like concerts or sporting events. . Carefully consider travel plans you have or are making. . If you are planning any travel outside or inside the [place], visit the CDC's Travelers' Health webpage for the latest health notices. . If you have some symptoms but not all symptoms, continue to monitor at home and seek medical attention if your symptoms worsen. . If you are having a medical emergency, call 911.   ADDITIONAL HEALTHCARE OPTIONS FOR PATIENTS  [facility] / e-Visit: [email or web address]         [facility]: [phone]     Urgent Care: [phone]                   [facility]: [phone]    [facility] Discharge Instructions for Patients Receiving Chemotherapy  Today you received the following chemotherapy agents Velcade  To help prevent nausea and vomiting after your treatment, we encourage you to take your nausea medication as directed   If you develop nausea and vomiting that is not controlled by your nausea medication, call the clinic.   BELOW ARE  SYMPTOMS THAT SHOULD BE REPORTED IMMEDIATELY:  *FEVER GREATER THAN 100.5 F  *CHILLS WITH OR WITHOUT FEVER  NAUSEA AND VOMITING THAT IS NOT CONTROLLED WITH YOUR NAUSEA MEDICATION  *UNUSUAL SHORTNESS OF BREATH  *UNUSUAL BRUISING OR BLEEDING  TENDERNESS IN MOUTH AND THROAT WITH OR WITHOUT PRESENCE OF ULCERS  *URINARY PROBLEMS  *BOWEL PROBLEMS  UNUSUAL RASH Items with * indicate a potential emergency and should be followed up as soon as possible.  Feel free to call the clinic should you have any questions or concerns. The clinic phone number is [phone].  Please show the CHEMO ALERT CARD at check-in to the Emergency Department and triage nurse.   

## 2018-11-24 ENCOUNTER — Other Ambulatory Visit: Payer: Self-pay | Admitting: Medical Oncology

## 2018-11-24 DIAGNOSIS — C9000 Multiple myeloma not having achieved remission: Secondary | ICD-10-CM

## 2018-11-25 ENCOUNTER — Inpatient Hospital Stay: Payer: Self-pay | Attending: Internal Medicine

## 2018-11-25 ENCOUNTER — Other Ambulatory Visit: Payer: Self-pay

## 2018-11-25 ENCOUNTER — Encounter: Payer: Self-pay | Admitting: Internal Medicine

## 2018-11-25 ENCOUNTER — Telehealth: Payer: Self-pay | Admitting: Internal Medicine

## 2018-11-25 ENCOUNTER — Inpatient Hospital Stay: Payer: Self-pay

## 2018-11-25 ENCOUNTER — Inpatient Hospital Stay (HOSPITAL_BASED_OUTPATIENT_CLINIC_OR_DEPARTMENT_OTHER): Payer: Self-pay | Admitting: Internal Medicine

## 2018-11-25 VITALS — BP 143/81 | HR 75 | Temp 98.5°F | Resp 18 | Ht 68.0 in | Wt 207.6 lb

## 2018-11-25 DIAGNOSIS — R21 Rash and other nonspecific skin eruption: Secondary | ICD-10-CM | POA: Insufficient documentation

## 2018-11-25 DIAGNOSIS — C9000 Multiple myeloma not having achieved remission: Secondary | ICD-10-CM | POA: Insufficient documentation

## 2018-11-25 DIAGNOSIS — Z5111 Encounter for antineoplastic chemotherapy: Secondary | ICD-10-CM

## 2018-11-25 DIAGNOSIS — Z79899 Other long term (current) drug therapy: Secondary | ICD-10-CM | POA: Insufficient documentation

## 2018-11-25 DIAGNOSIS — G8929 Other chronic pain: Secondary | ICD-10-CM | POA: Insufficient documentation

## 2018-11-25 DIAGNOSIS — Z23 Encounter for immunization: Secondary | ICD-10-CM | POA: Insufficient documentation

## 2018-11-25 DIAGNOSIS — G629 Polyneuropathy, unspecified: Secondary | ICD-10-CM | POA: Insufficient documentation

## 2018-11-25 DIAGNOSIS — M549 Dorsalgia, unspecified: Secondary | ICD-10-CM | POA: Insufficient documentation

## 2018-11-25 LAB — CMP (CANCER CENTER ONLY)
ALT: 13 U/L (ref 0–44)
AST: 19 U/L (ref 15–41)
Albumin: 3.8 g/dL (ref 3.5–5.0)
Alkaline Phosphatase: 69 U/L (ref 38–126)
Anion gap: 10 (ref 5–15)
BUN: 17 mg/dL (ref 8–23)
CO2: 27 mmol/L (ref 22–32)
Calcium: 9 mg/dL (ref 8.9–10.3)
Chloride: 104 mmol/L (ref 98–111)
Creatinine: 1.3 mg/dL — ABNORMAL HIGH (ref 0.44–1.00)
GFR, Est AFR Am: 49 mL/min — ABNORMAL LOW (ref >60)
GFR, Estimated: 42 mL/min — ABNORMAL LOW (ref >60)
Glucose, Bld: 97 mg/dL (ref 70–99)
Potassium: 4.1 mmol/L (ref 3.5–5.1)
Sodium: 141 mmol/L (ref 135–145)
Total Bilirubin: 0.4 mg/dL (ref 0.3–1.2)
Total Protein: 7.3 g/dL (ref 6.5–8.1)

## 2018-11-25 LAB — CBC WITH DIFFERENTIAL (CANCER CENTER ONLY)
Abs Immature Granulocytes: 0.02 10*3/uL (ref 0.00–0.07)
Basophils Absolute: 0 10*3/uL (ref 0.0–0.1)
Basophils Relative: 0 %
Eosinophils Absolute: 0.1 10*3/uL (ref 0.0–0.5)
Eosinophils Relative: 2 %
HCT: 38.9 % (ref 36.0–46.0)
Hemoglobin: 13.2 g/dL (ref 12.0–15.0)
Immature Granulocytes: 0 %
Lymphocytes Relative: 20 %
Lymphs Abs: 0.9 10*3/uL (ref 0.7–4.0)
MCH: 31.9 pg (ref 26.0–34.0)
MCHC: 33.9 g/dL (ref 30.0–36.0)
MCV: 94 fL (ref 80.0–100.0)
Monocytes Absolute: 0.3 10*3/uL (ref 0.1–1.0)
Monocytes Relative: 6 %
Neutro Abs: 3.4 10*3/uL (ref 1.7–7.7)
Neutrophils Relative %: 72 %
Platelet Count: 89 10*3/uL — ABNORMAL LOW (ref 150–400)
RBC: 4.14 MIL/uL (ref 3.87–5.11)
RDW: 13.2 % (ref 11.5–15.5)
WBC Count: 4.7 10*3/uL (ref 4.0–10.5)
nRBC: 0 % (ref 0.0–0.2)

## 2018-11-25 MED ORDER — PROCHLORPERAZINE MALEATE 10 MG PO TABS
10.0000 mg | ORAL_TABLET | Freq: Once | ORAL | Status: AC
  Administered 2018-11-25: 10 mg via ORAL

## 2018-11-25 MED ORDER — BORTEZOMIB CHEMO SQ INJECTION 3.5 MG (2.5MG/ML)
1.3000 mg/m2 | Freq: Once | INTRAMUSCULAR | Status: AC
  Administered 2018-11-25: 10:00:00 2.75 mg via SUBCUTANEOUS
  Filled 2018-11-25: qty 1.1

## 2018-11-25 MED ORDER — PROCHLORPERAZINE MALEATE 10 MG PO TABS
ORAL_TABLET | ORAL | Status: AC
  Filled 2018-11-25: qty 1

## 2018-11-25 NOTE — Telephone Encounter (Signed)
Scheduled appt per 10/1 los - pt to get an updated schedule next visit.   

## 2018-11-25 NOTE — Patient Instructions (Signed)
[facility] Discharge Instructions for Patients Receiving Chemotherapy  Today you received the following chemotherapy agents Velcade.  To help prevent nausea and vomiting after your treatment, we encourage you to take your nausea medication as directed.  If you develop nausea and vomiting that is not controlled by your nausea medication, call the clinic.   BELOW ARE SYMPTOMS THAT SHOULD BE REPORTED IMMEDIATELY:  *FEVER GREATER THAN 100.5 F  *CHILLS WITH OR WITHOUT FEVER  NAUSEA AND VOMITING THAT IS NOT CONTROLLED WITH YOUR NAUSEA MEDICATION  *UNUSUAL SHORTNESS OF BREATH  *UNUSUAL BRUISING OR BLEEDING  TENDERNESS IN MOUTH AND THROAT WITH OR WITHOUT PRESENCE OF ULCERS  *URINARY PROBLEMS  *BOWEL PROBLEMS  UNUSUAL RASH Items with * indicate a potential emergency and should be followed up as soon as possible.  Feel free to call the clinic should you have any questions or concerns. The clinic phone number is [phone].  Please show the CHEMO ALERT CARD at check-in to the Emergency Department and triage nurse.   

## 2018-11-25 NOTE — Progress Notes (Signed)
Per Dr Mohamed it is okay to treat pt today with Velcade and todays platelet count. 

## 2018-11-25 NOTE — Progress Notes (Signed)
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  PRIOR THERAPY: None  CURRENT THERAPY: Systemic therapy with Velcade 1.3 mg/M2 weekly, Revlimid 25 mg p.o. daily for 14 days every 3 weeks in addition to weekly Decadron 40 mg orally. First dose of treatment 10/08/2017.Treatment was placed on hold due to development of a significant rash.Sheresumedtreatment with dexamethasone and Velcade on 11/19/2017. She will not resume treatment of Revlimid due to the rash.  Status post 11 months of treatment.  INTERVAL HISTORY: Debbie Chan 68 y.o. female returns to the clinic today for follow-up visit.  The patient is feeling fine today with no concerning complaints but continues to gain few pounds every visit secondary to her treatment with a steroid.  She denied having any chest pain, shortness breath, cough or hemoptysis.  She denied having any fever or chills.  She has no nausea, vomiting, diarrhea or constipation.  She has no headache or visual changes.  The patient is here today for evaluation before her treatment.  MEDICAL HISTORY:No past medical history on file.  ALLERGIES:  has No Known Allergies.  MEDICATIONS:  Current Outpatient Medications  Medication Sig Dispense Refill  . acyclovir (ZOVIRAX) 200 MG capsule TAKE 1 CAPSULE BY MOUTH 2 TIMES DAILY. 60 capsule 2  . blood glucose meter kit and supplies Dispense based on patient and insurance preference. Use up to four times daily as directed. (FOR ICD-10 E10.9, E11.9). 1 each 0  . dexamethasone (DECADRON) 4 MG tablet Take 10 tablets (40 mg total) by mouth every 7 (seven) days. 40 tablet 3  . diphenhydrAMINE (BENADRYL ALLERGY) 25 MG tablet Take 1 tablet (25 mg total) by mouth every 6 (six) hours as needed for itching. (Patient not taking: Reported on 11/26/2017) 100  tablet 0  . furosemide (LASIX) 20 MG tablet Take once daily for 3 days for lower extremity edema, then stop. Repeat as needed for recurrent edema. (Patient not taking: Reported on 12/24/2017) 30 tablet 2  . gabapentin (NEURONTIN) 100 MG capsule Take 1 capsule (100 mg total) by mouth 3 (three) times daily. 90 capsule 1  . metoprolol tartrate (LOPRESSOR) 25 MG tablet Take 0.5 tablets (12.5 mg total) by mouth 2 (two) times daily. 30 tablet 0  . omeprazole (PRILOSEC) 40 MG capsule Take 1 capsule (40 mg total) by mouth daily. 30 capsule 2  . oxyCODONE-acetaminophen (PERCOCET/ROXICET) 5-325 MG tablet Take 1 tablet by mouth every 8 (eight) hours as needed for severe pain. (Patient not taking: Reported on 06/24/2018) 30 tablet 0  . polyethylene glycol (MIRALAX / GLYCOLAX) packet Take 17 g by mouth daily. (Patient not taking: Reported on 11/26/2017) 14 each 0  . prochlorperazine (COMPAZINE) 10 MG tablet Take 1 tablet (10 mg total) by mouth every 6 (six) hours as needed for nausea or vomiting. 30 tablet 0  . RELION GLUCOSE TEST STRIPS test strip USE 1 TO CHECK GLUCOSE ONCE DAILY    . ReliOn Ultra Thin Lancets 30G MISC USE 1 TO CHECK GLUCOSE ONCE DAILY FOR GLUCOSE TESTING    . traZODone (DESYREL) 50 MG tablet Take 1 tablet (50 mg total) by mouth at bedtime. (Patient not taking: Reported on 12/24/2017) 30 tablet 0   No current facility-administered medications for this visit.     SURGICAL HISTORY: No past surgical history on file.  REVIEW OF SYSTEMS:  A  comprehensive review of systems was negative except for: Constitutional: positive for fatigue   PHYSICAL EXAMINATION: General appearance: alert, cooperative, fatigued and no distress Head: Normocephalic, without obvious abnormality, atraumatic Neck: no adenopathy, no JVD, supple, symmetrical, trachea midline and thyroid not enlarged, symmetric, no tenderness/mass/nodules Lymph nodes: Cervical, supraclavicular, and axillary nodes normal. Resp: clear to  auscultation bilaterally Back: symmetric, no curvature. ROM normal. No CVA tenderness. Cardio: regular rate and rhythm, S1, S2 normal, no murmur, click, rub or gallop GI: soft, non-tender; bowel sounds normal; no masses,  no organomegaly Extremities: extremities normal, atraumatic, no cyanosis or edema  ECOG PERFORMANCE STATUS: 1 - Symptomatic but completely ambulatory  Blood pressure (!) 143/81, pulse 75, temperature 98.5 F (36.9 C), temperature source Temporal, resp. rate 18, height '5\' 8"'  (1.727 m), weight 207 lb 9.6 oz (94.2 kg), SpO2 100 %.  LABORATORY DATA: Lab Results  Component Value Date   WBC 4.7 11/25/2018   HGB 13.2 11/25/2018   HCT 38.9 11/25/2018   MCV 94.0 11/25/2018   PLT 89 (L) 11/25/2018      Chemistry      Component Value Date/Time   NA 140 11/18/2018 0845   K 4.2 11/18/2018 0845   CL 104 11/18/2018 0845   CO2 27 11/18/2018 0845   BUN 12 11/18/2018 0845   CREATININE 1.28 (H) 11/18/2018 0845      Component Value Date/Time   CALCIUM 8.9 11/18/2018 0845   ALKPHOS 69 11/18/2018 0845   AST 17 11/18/2018 0845   ALT 12 11/18/2018 0845   BILITOT 0.4 11/18/2018 0845       RADIOGRAPHIC STUDIES: No results found.  ASSESSMENT AND PLAN: This is a very pleasant 68 years old African female originally from [place] who is visiting her son in [place] and was recently diagnosed with IgA multiple myeloma. The patient was a started initially on treatment with weekly subcutaneous Velcade 1.3 mg/M2, Revlimid 25 mg p.o. daily for 21 days every 4 weeks as well as weekly Decadron 40 mg orally.  Revlimid was discontinued secondary to hypersensitivity reaction with significant skin rash.  She is currently on treatment with weekly Velcade and Decadron. She has been tolerating this treatment well with no concerning adverse effects. I recommended for her to proceed with her treatment today as planned. We will see her back for follow-up visit in 4 weeks for  evaluation. The patient is interested in traveling back to [place] to see her children.  I would consider her for a break off treatment to travel if needed.  She will need to discuss with her son before making the decision. She will continue on gabapentin for the peripheral neuropathy and back pain. She was advised to call immediately if she has any concerning symptoms in the interval. The patient voices understanding of current disease status and treatment options and is in agreement with the current care plan. All questions were answered. The patient knows to call the clinic with any problems, questions or concerns. We can certainly see the patient much sooner if necessary.  Disclaimer: This note was dictated with voice recognition software. Similar sounding words can inadvertently be transcribed and may not be corrected upon review.

## 2018-12-02 ENCOUNTER — Other Ambulatory Visit: Payer: Self-pay

## 2018-12-02 ENCOUNTER — Other Ambulatory Visit: Payer: Self-pay | Admitting: *Deleted

## 2018-12-02 ENCOUNTER — Inpatient Hospital Stay: Payer: Self-pay

## 2018-12-02 VITALS — BP 140/79 | HR 72 | Temp 98.5°F | Resp 14 | Wt 209.2 lb

## 2018-12-02 DIAGNOSIS — C9000 Multiple myeloma not having achieved remission: Secondary | ICD-10-CM

## 2018-12-02 DIAGNOSIS — Z23 Encounter for immunization: Secondary | ICD-10-CM

## 2018-12-02 LAB — CBC WITH DIFFERENTIAL (CANCER CENTER ONLY)
Abs Immature Granulocytes: 0.01 10*3/uL (ref 0.00–0.07)
Basophils Absolute: 0 10*3/uL (ref 0.0–0.1)
Basophils Relative: 0 %
Eosinophils Absolute: 0.1 10*3/uL (ref 0.0–0.5)
Eosinophils Relative: 2 %
HCT: 39.4 % (ref 36.0–46.0)
Hemoglobin: 13.3 g/dL (ref 12.0–15.0)
Immature Granulocytes: 0 %
Lymphocytes Relative: 19 %
Lymphs Abs: 0.9 10*3/uL (ref 0.7–4.0)
MCH: 31.4 pg (ref 26.0–34.0)
MCHC: 33.8 g/dL (ref 30.0–36.0)
MCV: 93.1 fL (ref 80.0–100.0)
Monocytes Absolute: 0.2 10*3/uL (ref 0.1–1.0)
Monocytes Relative: 5 %
Neutro Abs: 3.5 10*3/uL (ref 1.7–7.7)
Neutrophils Relative %: 74 %
Platelet Count: 99 10*3/uL — ABNORMAL LOW (ref 150–400)
RBC: 4.23 MIL/uL (ref 3.87–5.11)
RDW: 13.1 % (ref 11.5–15.5)
WBC Count: 4.7 10*3/uL (ref 4.0–10.5)
nRBC: 0 % (ref 0.0–0.2)

## 2018-12-02 LAB — CMP (CANCER CENTER ONLY)
ALT: 12 U/L (ref 0–44)
AST: 16 U/L (ref 15–41)
Albumin: 3.7 g/dL (ref 3.5–5.0)
Alkaline Phosphatase: 70 U/L (ref 38–126)
Anion gap: 9 (ref 5–15)
BUN: 17 mg/dL (ref 8–23)
CO2: 28 mmol/L (ref 22–32)
Calcium: 8.9 mg/dL (ref 8.9–10.3)
Chloride: 103 mmol/L (ref 98–111)
Creatinine: 1.29 mg/dL — ABNORMAL HIGH (ref 0.44–1.00)
GFR, Est AFR Am: 49 mL/min — ABNORMAL LOW (ref >60)
GFR, Estimated: 43 mL/min — ABNORMAL LOW (ref >60)
Glucose, Bld: 102 mg/dL — ABNORMAL HIGH (ref 70–99)
Potassium: 4.4 mmol/L (ref 3.5–5.1)
Sodium: 140 mmol/L (ref 135–145)
Total Bilirubin: 0.4 mg/dL (ref 0.3–1.2)
Total Protein: 7.2 g/dL (ref 6.5–8.1)

## 2018-12-02 MED ORDER — PROCHLORPERAZINE MALEATE 10 MG PO TABS
ORAL_TABLET | ORAL | Status: AC
  Filled 2018-12-02: qty 1

## 2018-12-02 MED ORDER — INFLUENZA VAC A&B SA ADJ QUAD 0.5 ML IM PRSY
0.5000 mL | PREFILLED_SYRINGE | Freq: Once | INTRAMUSCULAR | Status: AC
  Administered 2018-12-02: 0.5 mL via INTRAMUSCULAR

## 2018-12-02 MED ORDER — PROCHLORPERAZINE MALEATE 10 MG PO TABS
10.0000 mg | ORAL_TABLET | Freq: Once | ORAL | Status: AC
  Administered 2018-12-02: 10 mg via ORAL

## 2018-12-02 MED ORDER — INFLUENZA VAC A&B SA ADJ QUAD 0.5 ML IM PRSY
PREFILLED_SYRINGE | INTRAMUSCULAR | Status: AC
  Filled 2018-12-02: qty 0.5

## 2018-12-02 MED ORDER — BORTEZOMIB CHEMO SQ INJECTION 3.5 MG (2.5MG/ML)
1.3000 mg/m2 | Freq: Once | INTRAMUSCULAR | Status: AC
  Administered 2018-12-02: 10:00:00 2.75 mg via SUBCUTANEOUS
  Filled 2018-12-02: qty 1.1

## 2018-12-02 NOTE — Patient Instructions (Signed)
[facility] Discharge Instructions for Patients Receiving Chemotherapy  Today you received the following chemotherapy agents Velcade  To help prevent nausea and vomiting after your treatment, we encourage you to take your nausea medication as directed by your MD.   If you develop nausea and vomiting that is not controlled by your nausea medication, call the clinic.   BELOW ARE SYMPTOMS THAT SHOULD BE REPORTED IMMEDIATELY:  *FEVER GREATER THAN 100.5 F  *CHILLS WITH OR WITHOUT FEVER  NAUSEA AND VOMITING THAT IS NOT CONTROLLED WITH YOUR NAUSEA MEDICATION  *UNUSUAL SHORTNESS OF BREATH  *UNUSUAL BRUISING OR BLEEDING  TENDERNESS IN MOUTH AND THROAT WITH OR WITHOUT PRESENCE OF ULCERS  *URINARY PROBLEMS  *BOWEL PROBLEMS  UNUSUAL RASH Items with * indicate a potential emergency and should be followed up as soon as possible.  Feel free to call the clinic should you have any questions or concerns. The clinic phone number is [phone].  Please show the CHEMO ALERT CARD at check-in to the Emergency Department and triage nurse.  Coronavirus (COVID-19) Are you at risk?  Are you at risk for the Coronavirus (COVID-19)?  To be considered HIGH RISK for Coronavirus (COVID-19), you have to meet the following criteria:  . Traveled to [place], [place], [place], [place] or [place]; or in the [place] to [place], [place], [place], or [place]; and have fever, cough, and shortness of breath within the last 2 weeks of travel OR . Been in close contact with a person diagnosed with COVID-19 within the last 2 weeks and have fever, cough, and shortness of breath . IF YOU DO NOT MEET THESE CRITERIA, YOU ARE CONSIDERED LOW RISK FOR COVID-19.  What to do if you are HIGH RISK for COVID-19?  . If you are having a medical emergency, call 911. . Seek medical care right away. Before you go to a doctor's office, urgent care or emergency department, call ahead and tell them about  your recent travel, contact with someone diagnosed with COVID-19, and your symptoms. You should receive instructions from your physician's office regarding next steps of care.  . When you arrive at healthcare provider, tell the healthcare staff immediately you have returned from visiting [place], [place], [place], [place] or [place]; or traveled in the [place] to [place], [place], [place], or [place]; in the last two weeks or you have been in close contact with a person diagnosed with COVID-19 in the last 2 weeks.   . Tell the health care staff about your symptoms: fever, cough and shortness of breath. . After you have been seen by a medical provider, you will be either: o Tested for (COVID-19) and discharged home on quarantine except to seek medical care if symptoms worsen, and asked to  - Stay home and avoid contact with others until you get your results (4-5 days)  - Avoid travel on public transportation if possible (such as bus, train, or airplane) or o Sent to the Emergency Department by EMS for evaluation, COVID-19 testing, and possible admission depending on your condition and test results.  What to do if you are LOW RISK for COVID-19?  Reduce your risk of any infection by using the same precautions used for avoiding the common cold or flu:  . Wash your hands often with soap and warm water for at least 20 seconds.  If soap and water are not readily available, use an alcohol-based hand sanitizer with at least 60% alcohol.  . If   coughing or sneezing, cover your mouth and nose by coughing or sneezing into the elbow areas of your shirt or coat, into a tissue or into your sleeve (not your hands). . Avoid shaking hands with others and consider head nods or verbal greetings only. . Avoid touching your eyes, nose, or mouth with unwashed hands.  . Avoid close contact with people who are sick. . Avoid places or events with large numbers of people in one location, like concerts or sporting  events. . Carefully consider travel plans you have or are making. . If you are planning any travel outside or inside the [place], visit the CDC's Travelers' Health webpage for the latest health notices. . If you have some symptoms but not all symptoms, continue to monitor at home and seek medical attention if your symptoms worsen. . If you are having a medical emergency, call 911.   ADDITIONAL HEALTHCARE OPTIONS FOR PATIENTS  [facility] / e-Visit: [email or web address]         [facility]: [phone]  [facility]: [phone]                   [facility]: [phone]    

## 2018-12-02 NOTE — Progress Notes (Signed)
Platelets=99 per Dr. Providence Lanius  For treatment today. Per pt. she took her po Decadron this morning.

## 2018-12-08 ENCOUNTER — Other Ambulatory Visit: Payer: Self-pay | Admitting: Medical Oncology

## 2018-12-08 DIAGNOSIS — C9000 Multiple myeloma not having achieved remission: Secondary | ICD-10-CM

## 2018-12-09 ENCOUNTER — Telehealth: Payer: Self-pay | Admitting: Medical Oncology

## 2018-12-09 ENCOUNTER — Inpatient Hospital Stay: Payer: Self-pay

## 2018-12-09 ENCOUNTER — Other Ambulatory Visit: Payer: Self-pay | Admitting: Medical Oncology

## 2018-12-09 ENCOUNTER — Other Ambulatory Visit: Payer: Self-pay

## 2018-12-09 VITALS — BP 154/72 | HR 75 | Temp 98.0°F | Resp 18

## 2018-12-09 DIAGNOSIS — C9000 Multiple myeloma not having achieved remission: Secondary | ICD-10-CM

## 2018-12-09 DIAGNOSIS — K219 Gastro-esophageal reflux disease without esophagitis: Secondary | ICD-10-CM

## 2018-12-09 LAB — CBC WITH DIFFERENTIAL (CANCER CENTER ONLY)
Abs Immature Granulocytes: 0.02 10*3/uL (ref 0.00–0.07)
Basophils Absolute: 0 10*3/uL (ref 0.0–0.1)
Basophils Relative: 0 %
Eosinophils Absolute: 0.1 10*3/uL (ref 0.0–0.5)
Eosinophils Relative: 2 %
HCT: 38.5 % (ref 36.0–46.0)
Hemoglobin: 13.1 g/dL (ref 12.0–15.0)
Immature Granulocytes: 0 %
Lymphocytes Relative: 22 %
Lymphs Abs: 1.1 10*3/uL (ref 0.7–4.0)
MCH: 31.3 pg (ref 26.0–34.0)
MCHC: 34 g/dL (ref 30.0–36.0)
MCV: 91.9 fL (ref 80.0–100.0)
Monocytes Absolute: 0.2 10*3/uL (ref 0.1–1.0)
Monocytes Relative: 5 %
Neutro Abs: 3.5 10*3/uL (ref 1.7–7.7)
Neutrophils Relative %: 71 %
Platelet Count: 88 10*3/uL — ABNORMAL LOW (ref 150–400)
RBC: 4.19 MIL/uL (ref 3.87–5.11)
RDW: 13 % (ref 11.5–15.5)
WBC Count: 4.9 10*3/uL (ref 4.0–10.5)
nRBC: 0 % (ref 0.0–0.2)

## 2018-12-09 LAB — CMP (CANCER CENTER ONLY)
ALT: 11 U/L (ref 0–44)
AST: 16 U/L (ref 15–41)
Albumin: 3.5 g/dL (ref 3.5–5.0)
Alkaline Phosphatase: 69 U/L (ref 38–126)
Anion gap: 11 (ref 5–15)
BUN: 17 mg/dL (ref 8–23)
CO2: 26 mmol/L (ref 22–32)
Calcium: 8.9 mg/dL (ref 8.9–10.3)
Chloride: 103 mmol/L (ref 98–111)
Creatinine: 1.27 mg/dL — ABNORMAL HIGH (ref 0.44–1.00)
GFR, Est AFR Am: 50 mL/min — ABNORMAL LOW (ref >60)
GFR, Estimated: 43 mL/min — ABNORMAL LOW (ref >60)
Glucose, Bld: 92 mg/dL (ref 70–99)
Potassium: 4 mmol/L (ref 3.5–5.1)
Sodium: 140 mmol/L (ref 135–145)
Total Bilirubin: 0.5 mg/dL (ref 0.3–1.2)
Total Protein: 7 g/dL (ref 6.5–8.1)

## 2018-12-09 MED ORDER — DEXAMETHASONE 4 MG PO TABS: 40.0000 mg | ORAL_TABLET | ORAL | 3 refills | Status: DC

## 2018-12-09 MED ORDER — PROCHLORPERAZINE MALEATE 10 MG PO TABS
10.0000 mg | ORAL_TABLET | Freq: Once | ORAL | Status: AC
  Administered 2018-12-09: 10 mg via ORAL

## 2018-12-09 MED ORDER — OMEPRAZOLE 40 MG PO CPDR: 40.0000 mg | DELAYED_RELEASE_CAPSULE | Freq: Every day | ORAL | 2 refills | Status: DC

## 2018-12-09 MED ORDER — BORTEZOMIB CHEMO SQ INJECTION 3.5 MG (2.5MG/ML)
1.3000 mg/m2 | Freq: Once | INTRAMUSCULAR | Status: AC
  Administered 2018-12-09: 2.75 mg via SUBCUTANEOUS
  Filled 2018-12-09: qty 1.1

## 2018-12-09 MED ORDER — ACYCLOVIR 200 MG PO CAPS: ORAL_CAPSULE | ORAL | 2 refills | Status: DC

## 2018-12-09 MED ORDER — PROCHLORPERAZINE MALEATE 10 MG PO TABS
ORAL_TABLET | ORAL | Status: AC
  Filled 2018-12-09: qty 1

## 2018-12-09 MED FILL — DEXAMETHASONE 4 MG TABLET: 4 | 28 days supply | Qty: 40 | Fill #0

## 2018-12-09 MED FILL — OMEPRAZOLE 40 MG CPDR: 40 | 30 days supply | Qty: 30 | Fill #0

## 2018-12-09 MED FILL — ACYCLOVIR 200 MG CAP: 200 | 30 days supply | Qty: 60 | Fill #0

## 2018-12-09 NOTE — Progress Notes (Signed)
Per Dr. Julien Nordmann ok to treat with platelets of 88 today.

## 2018-12-09 NOTE — Telephone Encounter (Signed)
Pt requests refills for decadron,omeperazole and acyclovir

## 2018-12-09 NOTE — Patient Instructions (Signed)
[facility] Discharge Instructions for Patients Receiving Chemotherapy  Today you received the following chemotherapy agents Velcade  To help prevent nausea and vomiting after your treatment, we encourage you to take your nausea medication as directed by your MD.   If you develop nausea and vomiting that is not controlled by your nausea medication, call the clinic.   BELOW ARE SYMPTOMS THAT SHOULD BE REPORTED IMMEDIATELY:  *FEVER GREATER THAN 100.5 F  *CHILLS WITH OR WITHOUT FEVER  NAUSEA AND VOMITING THAT IS NOT CONTROLLED WITH YOUR NAUSEA MEDICATION  *UNUSUAL SHORTNESS OF BREATH  *UNUSUAL BRUISING OR BLEEDING  TENDERNESS IN MOUTH AND THROAT WITH OR WITHOUT PRESENCE OF ULCERS  *URINARY PROBLEMS  *BOWEL PROBLEMS  UNUSUAL RASH Items with * indicate a potential emergency and should be followed up as soon as possible.  Feel free to call the clinic should you have any questions or concerns. The clinic phone number is [phone].  Please show the CHEMO ALERT CARD at check-in to the Emergency Department and triage nurse.  Coronavirus (COVID-19) Are you at risk?  Are you at risk for the Coronavirus (COVID-19)?  To be considered HIGH RISK for Coronavirus (COVID-19), you have to meet the following criteria:  . Traveled to [place], [place], [place], [place] or [place]; or in the [place] to [place], [place], [place], or [place]; and have fever, cough, and shortness of breath within the last 2 weeks of travel OR . Been in close contact with a person diagnosed with COVID-19 within the last 2 weeks and have fever, cough, and shortness of breath . IF YOU DO NOT MEET THESE CRITERIA, YOU ARE CONSIDERED LOW RISK FOR COVID-19.  What to do if you are HIGH RISK for COVID-19?  . If you are having a medical emergency, call 911. . Seek medical care right away. Before you go to a doctor's office, urgent care or emergency department, call ahead and tell them about  your recent travel, contact with someone diagnosed with COVID-19, and your symptoms. You should receive instructions from your physician's office regarding next steps of care.  . When you arrive at healthcare provider, tell the healthcare staff immediately you have returned from visiting [place], [place], [place], [place] or [place]; or traveled in the [place] to [place], [place], [place], or [place]; in the last two weeks or you have been in close contact with a person diagnosed with COVID-19 in the last 2 weeks.   . Tell the health care staff about your symptoms: fever, cough and shortness of breath. . After you have been seen by a medical provider, you will be either: o Tested for (COVID-19) and discharged home on quarantine except to seek medical care if symptoms worsen, and asked to  - Stay home and avoid contact with others until you get your results (4-5 days)  - Avoid travel on public transportation if possible (such as bus, train, or airplane) or o Sent to the Emergency Department by EMS for evaluation, COVID-19 testing, and possible admission depending on your condition and test results.  What to do if you are LOW RISK for COVID-19?  Reduce your risk of any infection by using the same precautions used for avoiding the common cold or flu:  . Wash your hands often with soap and warm water for at least 20 seconds.  If soap and water are not readily available, use an alcohol-based hand sanitizer with at least 60% alcohol.  . If   coughing or sneezing, cover your mouth and nose by coughing or sneezing into the elbow areas of your shirt or coat, into a tissue or into your sleeve (not your hands). . Avoid shaking hands with others and consider head nods or verbal greetings only. . Avoid touching your eyes, nose, or mouth with unwashed hands.  . Avoid close contact with people who are sick. . Avoid places or events with large numbers of people in one location, like concerts or sporting  events. . Carefully consider travel plans you have or are making. . If you are planning any travel outside or inside the [place], visit the CDC's Travelers' Health webpage for the latest health notices. . If you have some symptoms but not all symptoms, continue to monitor at home and seek medical attention if your symptoms worsen. . If you are having a medical emergency, call 911.   ADDITIONAL HEALTHCARE OPTIONS FOR PATIENTS  [facility] / e-Visit: [email or web address]         [facility]: [phone]  [facility]: [phone]                   [facility]: [phone]    

## 2018-12-09 NOTE — Progress Notes (Signed)
Plt =88, ok to treat per MD

## 2018-12-15 ENCOUNTER — Other Ambulatory Visit: Payer: Self-pay | Admitting: Medical Oncology

## 2018-12-15 DIAGNOSIS — C9000 Multiple myeloma not having achieved remission: Secondary | ICD-10-CM

## 2018-12-16 ENCOUNTER — Inpatient Hospital Stay: Payer: Self-pay

## 2018-12-16 ENCOUNTER — Other Ambulatory Visit: Payer: Self-pay

## 2018-12-16 VITALS — BP 158/72 | HR 79 | Temp 98.5°F | Resp 18

## 2018-12-16 DIAGNOSIS — C9000 Multiple myeloma not having achieved remission: Secondary | ICD-10-CM

## 2018-12-16 LAB — CBC WITH DIFFERENTIAL (CANCER CENTER ONLY)
Abs Immature Granulocytes: 0.02 10*3/uL (ref 0.00–0.07)
Basophils Absolute: 0 10*3/uL (ref 0.0–0.1)
Basophils Relative: 0 %
Eosinophils Absolute: 0.1 10*3/uL (ref 0.0–0.5)
Eosinophils Relative: 1 %
HCT: 37.6 % (ref 36.0–46.0)
Hemoglobin: 12.8 g/dL (ref 12.0–15.0)
Immature Granulocytes: 1 %
Lymphocytes Relative: 32 %
Lymphs Abs: 1.4 10*3/uL (ref 0.7–4.0)
MCH: 31.4 pg (ref 26.0–34.0)
MCHC: 34 g/dL (ref 30.0–36.0)
MCV: 92.2 fL (ref 80.0–100.0)
Monocytes Absolute: 0.5 10*3/uL (ref 0.1–1.0)
Monocytes Relative: 11 %
Neutro Abs: 2.4 10*3/uL (ref 1.7–7.7)
Neutrophils Relative %: 55 %
Platelet Count: 110 10*3/uL — ABNORMAL LOW (ref 150–400)
RBC: 4.08 MIL/uL (ref 3.87–5.11)
RDW: 13.1 % (ref 11.5–15.5)
WBC Count: 4.3 10*3/uL (ref 4.0–10.5)
nRBC: 0 % (ref 0.0–0.2)

## 2018-12-16 LAB — CMP (CANCER CENTER ONLY)
ALT: 12 U/L (ref 0–44)
AST: 17 U/L (ref 15–41)
Albumin: 3.5 g/dL (ref 3.5–5.0)
Alkaline Phosphatase: 70 U/L (ref 38–126)
Anion gap: 11 (ref 5–15)
BUN: 16 mg/dL (ref 8–23)
CO2: 26 mmol/L (ref 22–32)
Calcium: 8.8 mg/dL — ABNORMAL LOW (ref 8.9–10.3)
Chloride: 105 mmol/L (ref 98–111)
Creatinine: 1.18 mg/dL — ABNORMAL HIGH (ref 0.44–1.00)
GFR, Est AFR Am: 55 mL/min — ABNORMAL LOW (ref >60)
GFR, Estimated: 47 mL/min — ABNORMAL LOW (ref >60)
Glucose, Bld: 74 mg/dL (ref 70–99)
Potassium: 4.3 mmol/L (ref 3.5–5.1)
Sodium: 142 mmol/L (ref 135–145)
Total Bilirubin: 0.4 mg/dL (ref 0.3–1.2)
Total Protein: 7 g/dL (ref 6.5–8.1)

## 2018-12-16 MED ORDER — PROCHLORPERAZINE MALEATE 10 MG PO TABS
10.0000 mg | ORAL_TABLET | Freq: Once | ORAL | Status: AC
  Administered 2018-12-16: 10 mg via ORAL

## 2018-12-16 MED ORDER — PROCHLORPERAZINE MALEATE 10 MG PO TABS
ORAL_TABLET | ORAL | Status: AC
  Filled 2018-12-16: qty 1

## 2018-12-16 MED ORDER — BORTEZOMIB CHEMO SQ INJECTION 3.5 MG (2.5MG/ML)
1.3000 mg/m2 | Freq: Once | INTRAMUSCULAR | Status: AC
  Administered 2018-12-16: 10:00:00 2.75 mg via SUBCUTANEOUS
  Filled 2018-12-16: qty 1.1

## 2018-12-16 NOTE — Patient Instructions (Addendum)
[facility] Discharge Instructions for Patients Receiving Chemotherapy  Today you received the following chemotherapy agents Velcade.  To help prevent nausea and vomiting after your treatment, we encourage you to take your nausea medication as directed.  If you develop nausea and vomiting that is not controlled by your nausea medication, call the clinic.   BELOW ARE SYMPTOMS THAT SHOULD BE REPORTED IMMEDIATELY:  *FEVER GREATER THAN 100.5 F  *CHILLS WITH OR WITHOUT FEVER  NAUSEA AND VOMITING THAT IS NOT CONTROLLED WITH YOUR NAUSEA MEDICATION  *UNUSUAL SHORTNESS OF BREATH  *UNUSUAL BRUISING OR BLEEDING  TENDERNESS IN MOUTH AND THROAT WITH OR WITHOUT PRESENCE OF ULCERS  *URINARY PROBLEMS  *BOWEL PROBLEMS  UNUSUAL RASH Items with * indicate a potential emergency and should be followed up as soon as possible.  Feel free to call the clinic should you have any questions or concerns. The clinic phone number is [phone].  Please show the CHEMO ALERT CARD at check-in to the Emergency Department and triage nurse.   

## 2018-12-22 ENCOUNTER — Other Ambulatory Visit: Payer: Self-pay | Admitting: Medical Oncology

## 2018-12-22 DIAGNOSIS — C9000 Multiple myeloma not having achieved remission: Secondary | ICD-10-CM

## 2018-12-23 ENCOUNTER — Inpatient Hospital Stay: Payer: Self-pay

## 2018-12-23 ENCOUNTER — Inpatient Hospital Stay (HOSPITAL_BASED_OUTPATIENT_CLINIC_OR_DEPARTMENT_OTHER): Payer: Self-pay | Admitting: Physician Assistant

## 2018-12-23 ENCOUNTER — Other Ambulatory Visit: Payer: Self-pay

## 2018-12-23 ENCOUNTER — Encounter: Payer: Self-pay | Admitting: Physician Assistant

## 2018-12-23 VITALS — BP 126/62 | HR 79 | Temp 98.2°F | Resp 18 | Ht 68.0 in | Wt 209.7 lb

## 2018-12-23 DIAGNOSIS — Z5111 Encounter for antineoplastic chemotherapy: Secondary | ICD-10-CM

## 2018-12-23 DIAGNOSIS — G629 Polyneuropathy, unspecified: Secondary | ICD-10-CM

## 2018-12-23 DIAGNOSIS — C9000 Multiple myeloma not having achieved remission: Secondary | ICD-10-CM

## 2018-12-23 LAB — CBC WITH DIFFERENTIAL (CANCER CENTER ONLY)
Abs Immature Granulocytes: 0.02 10*3/uL (ref 0.00–0.07)
Basophils Absolute: 0 10*3/uL (ref 0.0–0.1)
Basophils Relative: 0 %
Eosinophils Absolute: 0.1 10*3/uL (ref 0.0–0.5)
Eosinophils Relative: 2 %
HCT: 37.5 % (ref 36.0–46.0)
Hemoglobin: 12.7 g/dL (ref 12.0–15.0)
Immature Granulocytes: 0 %
Lymphocytes Relative: 19 %
Lymphs Abs: 0.9 10*3/uL (ref 0.7–4.0)
MCH: 30.8 pg (ref 26.0–34.0)
MCHC: 33.9 g/dL (ref 30.0–36.0)
MCV: 91 fL (ref 80.0–100.0)
Monocytes Absolute: 0.3 10*3/uL (ref 0.1–1.0)
Monocytes Relative: 6 %
Neutro Abs: 3.3 10*3/uL (ref 1.7–7.7)
Neutrophils Relative %: 73 %
Platelet Count: 104 10*3/uL — ABNORMAL LOW (ref 150–400)
RBC: 4.12 MIL/uL (ref 3.87–5.11)
RDW: 13.1 % (ref 11.5–15.5)
WBC Count: 4.6 10*3/uL (ref 4.0–10.5)
nRBC: 0 % (ref 0.0–0.2)

## 2018-12-23 LAB — CMP (CANCER CENTER ONLY)
ALT: 10 U/L (ref 0–44)
AST: 17 U/L (ref 15–41)
Albumin: 3.5 g/dL (ref 3.5–5.0)
Alkaline Phosphatase: 63 U/L (ref 38–126)
Anion gap: 12 (ref 5–15)
BUN: 16 mg/dL (ref 8–23)
CO2: 23 mmol/L (ref 22–32)
Calcium: 8.7 mg/dL — ABNORMAL LOW (ref 8.9–10.3)
Chloride: 104 mmol/L (ref 98–111)
Creatinine: 1.32 mg/dL — ABNORMAL HIGH (ref 0.44–1.00)
GFR, Est AFR Am: 48 mL/min — ABNORMAL LOW (ref >60)
GFR, Estimated: 41 mL/min — ABNORMAL LOW (ref >60)
Glucose, Bld: 97 mg/dL (ref 70–99)
Potassium: 4.5 mmol/L (ref 3.5–5.1)
Sodium: 139 mmol/L (ref 135–145)
Total Bilirubin: 0.4 mg/dL (ref 0.3–1.2)
Total Protein: 7.1 g/dL (ref 6.5–8.1)

## 2018-12-23 MED ORDER — PROCHLORPERAZINE MALEATE 10 MG PO TABS
10.0000 mg | ORAL_TABLET | Freq: Once | ORAL | Status: AC
  Administered 2018-12-23: 09:00:00 10 mg via ORAL

## 2018-12-23 MED ORDER — PROCHLORPERAZINE MALEATE 10 MG PO TABS
ORAL_TABLET | ORAL | Status: AC
  Filled 2018-12-23: qty 1

## 2018-12-23 MED ORDER — BORTEZOMIB CHEMO SQ INJECTION 3.5 MG (2.5MG/ML)
1.3000 mg/m2 | Freq: Once | INTRAMUSCULAR | Status: AC
  Administered 2018-12-23: 2.75 mg via SUBCUTANEOUS
  Filled 2018-12-23: qty 1.1

## 2018-12-23 MED ORDER — GABAPENTIN 100 MG PO CAPS: 100.0000 mg | ORAL_CAPSULE | Freq: Three times a day (TID) | ORAL | 2 refills | Status: DC

## 2018-12-23 MED FILL — GABAPENTIN 100 MG CAPSULE: 100 | 30 days supply | Qty: 90 | Fill #0

## 2018-12-23 NOTE — Progress Notes (Signed)
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  PRIOR THERAPY: None  CURRENT THERAPY: Systemic therapy with Velcade 1.3 mg/M2 weekly, Revlimid 25 mg p.o. daily for 14 days every 3 weeks in addition to weekly Decadron 40 mg orally. First dose of treatment 10/08/2017.Treatment was placed on hold due to development of a significant rash.Sheresumedtreatment with dexamethasone and Velcade on 11/19/2017. She will not resume treatment of Revlimid due to the rash.Status post 14 months of treatment.  INTERVAL HISTORY: Debbie Chan 68 y.o. female returns to the clinic for a follow up visit. The patient is feeling fairly well today without any concerning complaints except for continued weight gain and mild swelling in her lower extremities secondary to her decadron. She denies any nausea, vomiting, diarrhea, or constipation. She denies any weight loss, lymphadenopathy, fever, chills, or night sweats. She denies any easy bleeding or bruising including. She has numbness in her toes bilaterally. Denies numbness in her hands/fingers. She is here today for evaluation before starting cycle 20  MEDICAL HISTORY:No past medical history on file.  ALLERGIES:  has No Known Allergies.  MEDICATIONS:  Current Outpatient Medications  Medication Sig Dispense Refill  . acyclovir (ZOVIRAX) 200 MG capsule TAKE 1 CAPSULE BY MOUTH 2 TIMES DAILY. 60 capsule 2  . blood glucose meter kit and supplies Dispense based on patient and insurance preference. Use up to four times daily as directed. (FOR ICD-10 E10.9, E11.9). 1 each 0  . dexamethasone (DECADRON) 4 MG tablet Take 10 tablets (40 mg total) by mouth every 7 (seven) days. 40 tablet 3  . diphenhydrAMINE (BENADRYL ALLERGY) 25 MG tablet Take 1 tablet (25 mg total) by mouth every 6 (six) hours as needed for itching. 100 tablet 0  . furosemide  (LASIX) 20 MG tablet Take once daily for 3 days for lower extremity edema, then stop. Repeat as needed for recurrent edema. 30 tablet 2  . gabapentin (NEURONTIN) 100 MG capsule Take 1 capsule (100 mg total) by mouth 3 (three) times daily. 90 capsule 2  . omeprazole (PRILOSEC) 40 MG capsule Take 1 capsule (40 mg total) by mouth daily. 30 capsule 2  . oxyCODONE-acetaminophen (PERCOCET/ROXICET) 5-325 MG tablet Take 1 tablet by mouth every 8 (eight) hours as needed for severe pain. 30 tablet 0  . polyethylene glycol (MIRALAX / GLYCOLAX) packet Take 17 g by mouth daily. 14 each 0  . prochlorperazine (COMPAZINE) 10 MG tablet Take 1 tablet (10 mg total) by mouth every 6 (six) hours as needed for nausea or vomiting. 30 tablet 0  . RELION GLUCOSE TEST STRIPS test strip USE 1 TO CHECK GLUCOSE ONCE DAILY    . ReliOn Ultra Thin Lancets 30G MISC USE 1 TO CHECK GLUCOSE ONCE DAILY FOR GLUCOSE TESTING    . traZODone (DESYREL) 50 MG tablet Take 1 tablet (50 mg total) by mouth at bedtime. 30 tablet 0  . metoprolol tartrate (LOPRESSOR) 25 MG tablet Take 0.5 tablets (12.5 mg total) by mouth 2 (two) times daily. 30 tablet 0   No current facility-administered medications for this visit.    Facility-Administered Medications Ordered in Other Visits  Medication Dose Route Frequency Provider Last Rate Last Dose  . bortezomib SQ (VELCADE) chemo injection 2.75 mg  1.3 mg/m2 (Treatment Plan Recorded) Subcutaneous Once Curt Bears, MD      . prochlorperazine (COMPAZINE) tablet 10 mg  10 mg Oral Once Curt Bears, MD  SURGICAL HISTORY: No past surgical history on file.  REVIEW OF SYSTEMS:   Review of Systems  Constitutional: Positive for weight gain. Negative for appetite change, chills, fatigue, and fever HENT: Negative for mouth sores, nosebleeds, sore throat and trouble swallowing.   Eyes: Negative for eye problems and icterus.  Respiratory: Negative for cough, hemoptysis, shortness of breath and  wheezing.   Cardiovascular: Positive for stable bilateral lower extremity swelling. Negative for chest pain. Gastrointestinal:  Negative for abdominal pain, constipation, diarrhea, nausea and vomiting.  Genitourinary: Negative for bladder incontinence, difficulty urinating, dysuria, frequency and hematuria.   Musculoskeletal: Bilateral hip and thigh pain. Negative for back pain, gait problem, neck pain and neck stiffness.  Skin: Negative for itching and rash.  Neurological: Negative for dizziness, extremity weakness, gait problem, headaches, light-headedness and seizures.  Hematological: Negative for adenopathy. Does not bruise/bleed easily.  Psychiatric/Behavioral: Negative for confusion, depression and sleep disturbance. The patient is not nervous/anxious.     PHYSICAL EXAMINATION:  Blood pressure 126/62, pulse 79, temperature 98.2 F (36.8 C), temperature source Oral, resp. rate 18, height '5\' 8"'$  (1.727 m), weight 209 lb 11.2 oz (95.1 kg), SpO2 99 %.  ECOG PERFORMANCE STATUS: 1 - Symptomatic but completely ambulatory  Physical Exam  Constitutional: Oriented to person, place, and time and well-developed, well-nourished, and in no distress.  HENT:  Head: Normocephalic and atraumatic.  Mouth/Throat: Oropharynx is clear and moist. No oropharyngeal exudate.  Eyes: Conjunctivae are normal. Right eye exhibits no discharge. Left eye exhibits no discharge. No scleral icterus.  Neck: Normal range of motion. Neck supple.  Cardiovascular: Normal rate, regular rhythm, normal heart sounds and intact distal pulses.   Pulmonary/Chest: Effort normal and breath sounds normal. No respiratory distress. No wheezes. No rales.  Abdominal: Soft. Bowel sounds are normal. Exhibits no distension and no mass. There is no tenderness.  Musculoskeletal: Stable bilateral lower extremity edema. Normal range of motion.  Lymphadenopathy:    No cervical adenopathy.  Neurological: Alert and oriented to person, place, and  time. Exhibits normal muscle tone. Gait normal. Coordination normal.  Skin: Skin is warm and dry. No rash noted. Not diaphoretic. No erythema. No pallor.  Psychiatric: Mood, memory and judgment normal.  Vitals reviewed.  LABORATORY DATA: Lab Results  Component Value Date   WBC 4.6 12/23/2018   HGB 12.7 12/23/2018   HCT 37.5 12/23/2018   MCV 91.0 12/23/2018   PLT 104 (L) 12/23/2018      Chemistry      Component Value Date/Time   NA 139 12/23/2018 0808   K 4.5 12/23/2018 0808   CL 104 12/23/2018 0808   CO2 23 12/23/2018 0808   BUN 16 12/23/2018 0808   CREATININE 1.32 (H) 12/23/2018 0808      Component Value Date/Time   CALCIUM 8.7 (L) 12/23/2018 0808   ALKPHOS 63 12/23/2018 0808   AST 17 12/23/2018 0808   ALT 10 12/23/2018 0808   BILITOT 0.4 12/23/2018 0808       RADIOGRAPHIC STUDIES:  No results found.   ASSESSMENT/PLAN:  This is a very pleasant 68 years old African female originally from [place] who is visiting her son in [place] and was diagnosed with IgA multiple myeloma.  The patient was a started initially on treatment with weekly subcutaneous Velcade 1.3 mg/M2, Revlimid 25 mg p.o. daily for 21 days every 4 weeks as well as weekly Decadron 40 mg orally. She was tolerating the treatment well but she developed significant skin rash secondary to treatment  with Revlimid and this was discontinued.  The patient continued treatment with subcutaneous Velcade and Decadron.  The patient was seen with Dr. Julien Nordmann today. Labs were reviewed. We recommend that she proceed with cycle #20 today as scheduled.   I will order a myeloma panel to be performed prior to her next visit including a CBC, CMP, LDH, beta 2 microglobulin, quanti. Immunoglobulin, and kappa/lambda free light chain. The patient inquired about when she can take a break from treatment. We will review the results of her myeloma panel with her when we see her next time.   We will see her back  for a follow up visit in 4 weeks for evaluation and to review her myeloma panel before her next cycle of treatment.   For the neuropathy, she was given a refill of gabapentin 100 mg TID.   The patient was advised to call immediately if she has any concerning symptoms in the interval. The patient voices understanding of current disease status and treatment options and is in agreement with the current care plan. All questions were answered. The patient knows to call the clinic with any problems, questions or concerns. We can certainly see the patient much sooner if necessary  Orders Placed This Encounter  Procedures  . CBC with Differential (Cancer Center Only)    Standing Status:   Standing    Number of Occurrences:   12    Standing Expiration Date:   12/23/2019  . CMP ([facility] only)    Standing Status:   Standing    Number of Occurrences:   12    Standing Expiration Date:   12/23/2019  . Lactate dehydrogenase (LDH)    Standing Status:   Future    Standing Expiration Date:   12/23/2019  . QIG  (Quant. immunoglobulins  - IgG, IgA, IgM)    Standing Status:   Future    Standing Expiration Date:   12/23/2019  . Beta 2 microglobulin    Standing Status:   Future    Standing Expiration Date:   12/23/2019  . Kappa/lambda light chains    Standing Status:   Future    Standing Expiration Date:   12/23/2019     Cassandra L Heilingoetter, PA-C 12/23/18  ADDENDUM: Hematology/Oncology Attending: I had a face-to-face encounter with the patient today.  I recommended her care plan.  This is a very pleasant 68 years old African female with multiple myeloma.  She is currently undergoing treatment with weekly subcutaneous Velcade as well as Decadron.  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  She has chronic back pain as well as arthritis in her knees.  I recommended for the patient to resume her  treatment with gabapentin that she discontinued few months ago. We will see her back for follow-up visit in 4 weeks for evaluation after repeating myeloma panel for evaluation of her disease and recommendation regarding her condition. She was advised to call immediately if she has any other concerning symptoms in the interval.  Disclaimer: This note was dictated with voice recognition software. Similar sounding words can inadvertently be transcribed and may be missed upon review. Eilleen Kempf, MD 12/23/18

## 2018-12-23 NOTE — Patient Instructions (Signed)
[facility] Discharge Instructions for Patients Receiving Chemotherapy  Today you received the following chemotherapy agents Velcade.  To help prevent nausea and vomiting after your treatment, we encourage you to take your nausea medication as directed.  If you develop nausea and vomiting that is not controlled by your nausea medication, call the clinic.   BELOW ARE SYMPTOMS THAT SHOULD BE REPORTED IMMEDIATELY:  *FEVER GREATER THAN 100.5 F  *CHILLS WITH OR WITHOUT FEVER  NAUSEA AND VOMITING THAT IS NOT CONTROLLED WITH YOUR NAUSEA MEDICATION  *UNUSUAL SHORTNESS OF BREATH  *UNUSUAL BRUISING OR BLEEDING  TENDERNESS IN MOUTH AND THROAT WITH OR WITHOUT PRESENCE OF ULCERS  *URINARY PROBLEMS  *BOWEL PROBLEMS  UNUSUAL RASH Items with * indicate a potential emergency and should be followed up as soon as possible.  Feel free to call the clinic should you have any questions or concerns. The clinic phone number is [phone].  Please show the CHEMO ALERT CARD at check-in to the Emergency Department and triage nurse.   

## 2018-12-24 ENCOUNTER — Telehealth: Payer: Self-pay | Admitting: Physician Assistant

## 2018-12-24 NOTE — Telephone Encounter (Signed)
Scheduled per los. Called and left msg. Mailed printout  °

## 2018-12-30 ENCOUNTER — Inpatient Hospital Stay: Payer: Self-pay | Attending: Internal Medicine

## 2018-12-30 ENCOUNTER — Inpatient Hospital Stay: Payer: Self-pay

## 2018-12-30 ENCOUNTER — Other Ambulatory Visit: Payer: Self-pay

## 2018-12-30 VITALS — BP 135/68 | HR 74 | Temp 98.2°F | Resp 16

## 2018-12-30 DIAGNOSIS — G629 Polyneuropathy, unspecified: Secondary | ICD-10-CM | POA: Insufficient documentation

## 2018-12-30 DIAGNOSIS — C9000 Multiple myeloma not having achieved remission: Secondary | ICD-10-CM | POA: Insufficient documentation

## 2018-12-30 DIAGNOSIS — R21 Rash and other nonspecific skin eruption: Secondary | ICD-10-CM | POA: Insufficient documentation

## 2018-12-30 DIAGNOSIS — Z5111 Encounter for antineoplastic chemotherapy: Secondary | ICD-10-CM | POA: Insufficient documentation

## 2018-12-30 DIAGNOSIS — R5383 Other fatigue: Secondary | ICD-10-CM | POA: Insufficient documentation

## 2018-12-30 DIAGNOSIS — Z79899 Other long term (current) drug therapy: Secondary | ICD-10-CM | POA: Insufficient documentation

## 2018-12-30 LAB — CBC WITH DIFFERENTIAL (CANCER CENTER ONLY)
Abs Immature Granulocytes: 0.01 10*3/uL (ref 0.00–0.07)
Basophils Absolute: 0 10*3/uL (ref 0.0–0.1)
Basophils Relative: 0 %
Eosinophils Absolute: 0.1 10*3/uL (ref 0.0–0.5)
Eosinophils Relative: 1 %
HCT: 38.7 % (ref 36.0–46.0)
Hemoglobin: 13.1 g/dL (ref 12.0–15.0)
Immature Granulocytes: 0 %
Lymphocytes Relative: 16 %
Lymphs Abs: 0.8 10*3/uL (ref 0.7–4.0)
MCH: 31 pg (ref 26.0–34.0)
MCHC: 33.9 g/dL (ref 30.0–36.0)
MCV: 91.7 fL (ref 80.0–100.0)
Monocytes Absolute: 0.2 10*3/uL (ref 0.1–1.0)
Monocytes Relative: 4 %
Neutro Abs: 3.8 10*3/uL (ref 1.7–7.7)
Neutrophils Relative %: 79 %
Platelet Count: 85 10*3/uL — ABNORMAL LOW (ref 150–400)
RBC: 4.22 MIL/uL (ref 3.87–5.11)
RDW: 13.1 % (ref 11.5–15.5)
WBC Count: 4.9 10*3/uL (ref 4.0–10.5)
nRBC: 0 % (ref 0.0–0.2)

## 2018-12-30 LAB — CMP (CANCER CENTER ONLY)
ALT: 11 U/L (ref 0–44)
AST: 16 U/L (ref 15–41)
Albumin: 3.7 g/dL (ref 3.5–5.0)
Alkaline Phosphatase: 67 U/L (ref 38–126)
Anion gap: 14 (ref 5–15)
BUN: 14 mg/dL (ref 8–23)
CO2: 24 mmol/L (ref 22–32)
Calcium: 9.1 mg/dL (ref 8.9–10.3)
Chloride: 103 mmol/L (ref 98–111)
Creatinine: 1.33 mg/dL — ABNORMAL HIGH (ref 0.44–1.00)
GFR, Est AFR Am: 47 mL/min — ABNORMAL LOW (ref >60)
GFR, Estimated: 41 mL/min — ABNORMAL LOW (ref >60)
Glucose, Bld: 95 mg/dL (ref 70–99)
Potassium: 4.2 mmol/L (ref 3.5–5.1)
Sodium: 141 mmol/L (ref 135–145)
Total Bilirubin: 0.4 mg/dL (ref 0.3–1.2)
Total Protein: 7.2 g/dL (ref 6.5–8.1)

## 2018-12-30 MED ORDER — PROCHLORPERAZINE MALEATE 10 MG PO TABS
ORAL_TABLET | ORAL | Status: AC
  Filled 2018-12-30: qty 1

## 2018-12-30 MED ORDER — BORTEZOMIB CHEMO SQ INJECTION 3.5 MG (2.5MG/ML)
1.3000 mg/m2 | Freq: Once | INTRAMUSCULAR | Status: AC
  Administered 2018-12-30: 11:00:00 2.75 mg via SUBCUTANEOUS
  Filled 2018-12-30: qty 1.1

## 2018-12-30 MED ORDER — PROCHLORPERAZINE MALEATE 10 MG PO TABS
10.0000 mg | ORAL_TABLET | Freq: Once | ORAL | Status: AC
  Administered 2018-12-30: 10 mg via ORAL

## 2018-12-30 NOTE — Patient Instructions (Signed)
[facility] Discharge Instructions for Patients Receiving Chemotherapy  Today you received the following chemotherapy agents Velcade To help prevent nausea and vomiting after your treatment, we encourage you to take your nausea medication as prescribed.   If you develop nausea and vomiting that is not controlled by your nausea medication, call the clinic.   BELOW ARE SYMPTOMS THAT SHOULD BE REPORTED IMMEDIATELY:  *FEVER GREATER THAN 100.5 F  *CHILLS WITH OR WITHOUT FEVER  NAUSEA AND VOMITING THAT IS NOT CONTROLLED WITH YOUR NAUSEA MEDICATION  *UNUSUAL SHORTNESS OF BREATH  *UNUSUAL BRUISING OR BLEEDING  TENDERNESS IN MOUTH AND THROAT WITH OR WITHOUT PRESENCE OF ULCERS  *URINARY PROBLEMS  *BOWEL PROBLEMS  UNUSUAL RASH Items with * indicate a potential emergency and should be followed up as soon as possible.  Feel free to call the clinic should you have any questions or concerns. The clinic phone number is [phone].  Please show the CHEMO ALERT CARD at check-in to the Emergency Department and triage nurse.   

## 2018-12-30 NOTE — Addendum Note (Signed)
Addended by: Levada Schilling on: 12/30/2018 10:24 AM   Modules accepted: Orders

## 2018-12-30 NOTE — Progress Notes (Signed)
CMET and CBC Labs reviewed with MD, ok to treat despite counts.

## 2019-01-06 ENCOUNTER — Telehealth: Payer: Self-pay | Admitting: Medical Oncology

## 2019-01-06 ENCOUNTER — Inpatient Hospital Stay: Payer: Self-pay

## 2019-01-06 ENCOUNTER — Other Ambulatory Visit: Payer: Self-pay

## 2019-01-06 VITALS — BP 132/78 | HR 74 | Temp 98.5°F | Resp 16 | Wt 207.5 lb

## 2019-01-06 DIAGNOSIS — C9000 Multiple myeloma not having achieved remission: Secondary | ICD-10-CM

## 2019-01-06 LAB — CMP (CANCER CENTER ONLY)
ALT: 10 U/L (ref 0–44)
AST: 15 U/L (ref 15–41)
Albumin: 3.7 g/dL (ref 3.5–5.0)
Alkaline Phosphatase: 70 U/L (ref 38–126)
Anion gap: 12 (ref 5–15)
BUN: 16 mg/dL (ref 8–23)
CO2: 26 mmol/L (ref 22–32)
Calcium: 9.1 mg/dL (ref 8.9–10.3)
Chloride: 104 mmol/L (ref 98–111)
Creatinine: 1.33 mg/dL — ABNORMAL HIGH (ref 0.44–1.00)
GFR, Est AFR Am: 47 mL/min — ABNORMAL LOW (ref >60)
GFR, Estimated: 41 mL/min — ABNORMAL LOW (ref >60)
Glucose, Bld: 91 mg/dL (ref 70–99)
Potassium: 4.3 mmol/L (ref 3.5–5.1)
Sodium: 142 mmol/L (ref 135–145)
Total Bilirubin: 0.4 mg/dL (ref 0.3–1.2)
Total Protein: 7.3 g/dL (ref 6.5–8.1)

## 2019-01-06 LAB — CBC WITH DIFFERENTIAL (CANCER CENTER ONLY)
Abs Immature Granulocytes: 0.02 10*3/uL (ref 0.00–0.07)
Basophils Absolute: 0 10*3/uL (ref 0.0–0.1)
Basophils Relative: 0 %
Eosinophils Absolute: 0.1 10*3/uL (ref 0.0–0.5)
Eosinophils Relative: 2 %
HCT: 39.3 % (ref 36.0–46.0)
Hemoglobin: 13.1 g/dL (ref 12.0–15.0)
Immature Granulocytes: 0 %
Lymphocytes Relative: 18 %
Lymphs Abs: 0.8 10*3/uL (ref 0.7–4.0)
MCH: 31.3 pg (ref 26.0–34.0)
MCHC: 33.3 g/dL (ref 30.0–36.0)
MCV: 93.8 fL (ref 80.0–100.0)
Monocytes Absolute: 0.3 10*3/uL (ref 0.1–1.0)
Monocytes Relative: 6 %
Neutro Abs: 3.4 10*3/uL (ref 1.7–7.7)
Neutrophils Relative %: 74 %
Platelet Count: 97 10*3/uL — ABNORMAL LOW (ref 150–400)
RBC: 4.19 MIL/uL (ref 3.87–5.11)
RDW: 13.2 % (ref 11.5–15.5)
WBC Count: 4.6 10*3/uL (ref 4.0–10.5)
nRBC: 0 % (ref 0.0–0.2)

## 2019-01-06 MED ORDER — BORTEZOMIB CHEMO SQ INJECTION 3.5 MG (2.5MG/ML)
1.3000 mg/m2 | Freq: Once | INTRAMUSCULAR | Status: AC
  Administered 2019-01-06: 2.75 mg via SUBCUTANEOUS
  Filled 2019-01-06: qty 1.1

## 2019-01-06 MED ORDER — PROCHLORPERAZINE MALEATE 10 MG PO TABS
10.0000 mg | ORAL_TABLET | Freq: Once | ORAL | Status: AC
  Administered 2019-01-06: 10 mg via ORAL

## 2019-01-06 MED ORDER — PROCHLORPERAZINE MALEATE 10 MG PO TABS
ORAL_TABLET | ORAL | Status: AC
  Filled 2019-01-06: qty 1

## 2019-01-06 MED FILL — DEXAMETHASONE 4 MG TABLET: 4 | 28 days supply | Qty: 40 | Fill #1

## 2019-01-06 MED FILL — OMEPRAZOLE 40 MG CPDR: 40 | 30 days supply | Qty: 30 | Fill #1

## 2019-01-06 MED FILL — ACYCLOVIR 200 MG CAP: 200 | 30 days supply | Qty: 60 | Fill #1

## 2019-01-06 NOTE — Patient Instructions (Signed)
[facility] Discharge Instructions for Patients Receiving Chemotherapy  Today you received the following chemotherapy agents Velcade To help prevent nausea and vomiting after your treatment, we encourage you to take your nausea medication as prescribed.   If you develop nausea and vomiting that is not controlled by your nausea medication, call the clinic.   BELOW ARE SYMPTOMS THAT SHOULD BE REPORTED IMMEDIATELY:  *FEVER GREATER THAN 100.5 F  *CHILLS WITH OR WITHOUT FEVER  NAUSEA AND VOMITING THAT IS NOT CONTROLLED WITH YOUR NAUSEA MEDICATION  *UNUSUAL SHORTNESS OF BREATH  *UNUSUAL BRUISING OR BLEEDING  TENDERNESS IN MOUTH AND THROAT WITH OR WITHOUT PRESENCE OF ULCERS  *URINARY PROBLEMS  *BOWEL PROBLEMS  UNUSUAL RASH Items with * indicate a potential emergency and should be followed up as soon as possible.  Feel free to call the clinic should you have any questions or concerns. The clinic phone number is [phone].  Please show the CHEMO ALERT CARD at check-in to the Emergency Department and triage nurse.   

## 2019-01-06 NOTE — Telephone Encounter (Signed)
Family member notified to pick up [email or web address] [place] .

## 2019-01-06 NOTE — Progress Notes (Signed)
Per Dr. Julien Nordmann, ok to treat with Platelets 97.

## 2019-01-13 ENCOUNTER — Other Ambulatory Visit: Payer: Self-pay

## 2019-01-13 ENCOUNTER — Inpatient Hospital Stay: Payer: Self-pay

## 2019-01-13 VITALS — BP 115/64 | HR 70 | Temp 98.0°F | Resp 18

## 2019-01-13 DIAGNOSIS — C9000 Multiple myeloma not having achieved remission: Secondary | ICD-10-CM

## 2019-01-13 LAB — CBC WITH DIFFERENTIAL (CANCER CENTER ONLY)
Abs Immature Granulocytes: 0.01 10*3/uL (ref 0.00–0.07)
Basophils Absolute: 0 10*3/uL (ref 0.0–0.1)
Basophils Relative: 0 %
Eosinophils Absolute: 0.1 10*3/uL (ref 0.0–0.5)
Eosinophils Relative: 2 %
HCT: 39 % (ref 36.0–46.0)
Hemoglobin: 13.1 g/dL (ref 12.0–15.0)
Immature Granulocytes: 0 %
Lymphocytes Relative: 19 %
Lymphs Abs: 0.9 10*3/uL (ref 0.7–4.0)
MCH: 31.2 pg (ref 26.0–34.0)
MCHC: 33.6 g/dL (ref 30.0–36.0)
MCV: 92.9 fL (ref 80.0–100.0)
Monocytes Absolute: 0.2 10*3/uL (ref 0.1–1.0)
Monocytes Relative: 5 %
Neutro Abs: 3.4 10*3/uL (ref 1.7–7.7)
Neutrophils Relative %: 74 %
Platelet Count: 93 10*3/uL — ABNORMAL LOW (ref 150–400)
RBC: 4.2 MIL/uL (ref 3.87–5.11)
RDW: 13.1 % (ref 11.5–15.5)
WBC Count: 4.6 10*3/uL (ref 4.0–10.5)
nRBC: 0 % (ref 0.0–0.2)

## 2019-01-13 LAB — CMP (CANCER CENTER ONLY)
ALT: 13 U/L (ref 0–44)
AST: 16 U/L (ref 15–41)
Albumin: 3.7 g/dL (ref 3.5–5.0)
Alkaline Phosphatase: 69 U/L (ref 38–126)
Anion gap: 10 (ref 5–15)
BUN: 16 mg/dL (ref 8–23)
CO2: 28 mmol/L (ref 22–32)
Calcium: 9 mg/dL (ref 8.9–10.3)
Chloride: 103 mmol/L (ref 98–111)
Creatinine: 1.33 mg/dL — ABNORMAL HIGH (ref 0.44–1.00)
GFR, Est AFR Am: 47 mL/min — ABNORMAL LOW (ref >60)
GFR, Estimated: 41 mL/min — ABNORMAL LOW (ref >60)
Glucose, Bld: 96 mg/dL (ref 70–99)
Potassium: 4.2 mmol/L (ref 3.5–5.1)
Sodium: 141 mmol/L (ref 135–145)
Total Bilirubin: 0.4 mg/dL (ref 0.3–1.2)
Total Protein: 7.1 g/dL (ref 6.5–8.1)

## 2019-01-13 LAB — LACTATE DEHYDROGENASE: LDH: 166 U/L (ref 98–192)

## 2019-01-13 MED ORDER — BORTEZOMIB CHEMO SQ INJECTION 3.5 MG (2.5MG/ML)
1.3000 mg/m2 | Freq: Once | INTRAMUSCULAR | Status: AC
  Administered 2019-01-13: 2.75 mg via SUBCUTANEOUS
  Filled 2019-01-13: qty 1.1

## 2019-01-13 MED ORDER — PROCHLORPERAZINE MALEATE 10 MG PO TABS
ORAL_TABLET | ORAL | Status: AC
  Filled 2019-01-13: qty 1

## 2019-01-13 MED ORDER — PROCHLORPERAZINE MALEATE 10 MG PO TABS
10.0000 mg | ORAL_TABLET | Freq: Once | ORAL | Status: AC
  Administered 2019-01-13: 10 mg via ORAL

## 2019-01-13 NOTE — Patient Instructions (Signed)
[facility] Discharge Instructions for Patients Receiving Chemotherapy  Today you received the following chemotherapy agents: Bortezomib (VELCADE).  To help prevent nausea and vomiting after your treatment, we encourage you to take your nausea medication as prescribed.   If you develop nausea and vomiting that is not controlled by your nausea medication, call the clinic.   BELOW ARE SYMPTOMS THAT SHOULD BE REPORTED IMMEDIATELY:  *FEVER GREATER THAN 100.5 F  *CHILLS WITH OR WITHOUT FEVER  NAUSEA AND VOMITING THAT IS NOT CONTROLLED WITH YOUR NAUSEA MEDICATION  *UNUSUAL SHORTNESS OF BREATH  *UNUSUAL BRUISING OR BLEEDING  TENDERNESS IN MOUTH AND THROAT WITH OR WITHOUT PRESENCE OF ULCERS  *URINARY PROBLEMS  *BOWEL PROBLEMS  UNUSUAL RASH Items with * indicate a potential emergency and should be followed up as soon as possible.  Feel free to call the clinic should you have any questions or concerns. The clinic phone number is [phone].  Please show the CHEMO ALERT CARD at check-in to the Emergency Department and triage nurse.   Coronavirus (COVID-19) Are you at risk?  Are you at risk for the Coronavirus (COVID-19)?  To be considered HIGH RISK for Coronavirus (COVID-19), you have to meet the following criteria:  . Traveled to [place], [place], [place], [place] or [place]; or in the [place] to [place], [place], [place], or [place]; and have fever, cough, and shortness of breath within the last 2 weeks of travel OR . Been in close contact with a person diagnosed with COVID-19 within the last 2 weeks and have fever, cough, and shortness of breath . IF YOU DO NOT MEET THESE CRITERIA, YOU ARE CONSIDERED LOW RISK FOR COVID-19.  What to do if you are HIGH RISK for COVID-19?  . If you are having a medical emergency, call 911. . Seek medical care right away. Before you go to a doctor's office, urgent care or emergency department, call ahead and tell them  about your recent travel, contact with someone diagnosed with COVID-19, and your symptoms. You should receive instructions from your physician's office regarding next steps of care.  . When you arrive at healthcare provider, tell the healthcare staff immediately you have returned from visiting [place], [place], [place], [place] or [place]; or traveled in the [place] to [place], [place], [place], or [place]; in the last two weeks or you have been in close contact with a person diagnosed with COVID-19 in the last 2 weeks.   . Tell the health care staff about your symptoms: fever, cough and shortness of breath. . After you have been seen by a medical provider, you will be either: o Tested for (COVID-19) and discharged home on quarantine except to seek medical care if symptoms worsen, and asked to  - Stay home and avoid contact with others until you get your results (4-5 days)  - Avoid travel on public transportation if possible (such as bus, train, or airplane) or o Sent to the Emergency Department by EMS for evaluation, COVID-19 testing, and possible admission depending on your condition and test results.  What to do if you are LOW RISK for COVID-19?  Reduce your risk of any infection by using the same precautions used for avoiding the common cold or flu:  . Wash your hands often with soap and warm water for at least 20 seconds.  If soap and water are not readily available, use an alcohol-based hand sanitizer with at least 60% alcohol.  . If coughing   or sneezing, cover your mouth and nose by coughing or sneezing into the elbow areas of your shirt or coat, into a tissue or into your sleeve (not your hands). . Avoid shaking hands with others and consider head nods or verbal greetings only. . Avoid touching your eyes, nose, or mouth with unwashed hands.  . Avoid close contact with people who are sick. . Avoid places or events with large numbers of people in one location, like concerts or  sporting events. . Carefully consider travel plans you have or are making. . If you are planning any travel outside or inside the [place], visit the CDC's Travelers' Health webpage for the latest health notices. . If you have some symptoms but not all symptoms, continue to monitor at home and seek medical attention if your symptoms worsen. . If you are having a medical emergency, call 911.   ADDITIONAL HEALTHCARE OPTIONS FOR PATIENTS  [facility] / e-Visit: [email or web address]         [facility]: [phone]  [facility]: [phone]                   [facility]: [phone]   

## 2019-01-13 NOTE — Progress Notes (Signed)
Per Dr. Julien Nordmann, okay for patient to receive treatment with platelets 93.

## 2019-01-14 LAB — IGG, IGA, IGM
IgA: 1834 mg/dL — ABNORMAL HIGH (ref 87–352)
IgG (Immunoglobin G), Serum: 374 mg/dL — ABNORMAL LOW (ref 586–1602)
IgM (Immunoglobulin M), Srm: 8 mg/dL — ABNORMAL LOW (ref 26–217)

## 2019-01-14 LAB — BETA 2 MICROGLOBULIN, SERUM: Beta-2 Microglobulin: 2.2 mg/L (ref 0.6–2.4)

## 2019-01-14 LAB — KAPPA/LAMBDA LIGHT CHAINS
Kappa free light chain: 7.9 mg/L (ref 3.3–19.4)
Kappa, lambda light chain ratio: 0.01 — ABNORMAL LOW (ref 0.26–1.65)
Lambda free light chains: 670.4 mg/L — ABNORMAL HIGH (ref 5.7–26.3)

## 2019-01-19 ENCOUNTER — Inpatient Hospital Stay: Payer: Self-pay

## 2019-01-19 ENCOUNTER — Other Ambulatory Visit: Payer: Self-pay

## 2019-01-19 ENCOUNTER — Inpatient Hospital Stay (HOSPITAL_BASED_OUTPATIENT_CLINIC_OR_DEPARTMENT_OTHER): Payer: Self-pay | Admitting: Internal Medicine

## 2019-01-19 ENCOUNTER — Encounter: Payer: Self-pay | Admitting: Internal Medicine

## 2019-01-19 VITALS — BP 149/74 | HR 83 | Temp 97.8°F | Resp 17 | Ht 68.0 in | Wt 210.9 lb

## 2019-01-19 DIAGNOSIS — C9000 Multiple myeloma not having achieved remission: Secondary | ICD-10-CM

## 2019-01-19 DIAGNOSIS — N179 Acute kidney failure, unspecified: Secondary | ICD-10-CM

## 2019-01-19 DIAGNOSIS — Z5111 Encounter for antineoplastic chemotherapy: Secondary | ICD-10-CM

## 2019-01-19 LAB — CMP (CANCER CENTER ONLY)
ALT: 13 U/L (ref 0–44)
AST: 16 U/L (ref 15–41)
Albumin: 3.6 g/dL (ref 3.5–5.0)
Alkaline Phosphatase: 70 U/L (ref 38–126)
Anion gap: 13 (ref 5–15)
BUN: 20 mg/dL (ref 8–23)
CO2: 25 mmol/L (ref 22–32)
Calcium: 8.9 mg/dL (ref 8.9–10.3)
Chloride: 104 mmol/L (ref 98–111)
Creatinine: 1.33 mg/dL — ABNORMAL HIGH (ref 0.44–1.00)
GFR, Est AFR Am: 47 mL/min — ABNORMAL LOW (ref >60)
GFR, Estimated: 41 mL/min — ABNORMAL LOW (ref >60)
Glucose, Bld: 89 mg/dL (ref 70–99)
Potassium: 4.3 mmol/L (ref 3.5–5.1)
Sodium: 142 mmol/L (ref 135–145)
Total Bilirubin: 0.4 mg/dL (ref 0.3–1.2)
Total Protein: 6.9 g/dL (ref 6.5–8.1)

## 2019-01-19 LAB — CBC WITH DIFFERENTIAL (CANCER CENTER ONLY)
Abs Immature Granulocytes: 0.02 10*3/uL (ref 0.00–0.07)
Basophils Absolute: 0 10*3/uL (ref 0.0–0.1)
Basophils Relative: 0 %
Eosinophils Absolute: 0.1 10*3/uL (ref 0.0–0.5)
Eosinophils Relative: 1 %
HCT: 38.5 % (ref 36.0–46.0)
Hemoglobin: 13.2 g/dL (ref 12.0–15.0)
Immature Granulocytes: 1 %
Lymphocytes Relative: 20 %
Lymphs Abs: 0.9 10*3/uL (ref 0.7–4.0)
MCH: 31.4 pg (ref 26.0–34.0)
MCHC: 34.3 g/dL (ref 30.0–36.0)
MCV: 91.4 fL (ref 80.0–100.0)
Monocytes Absolute: 0.3 10*3/uL (ref 0.1–1.0)
Monocytes Relative: 8 %
Neutro Abs: 3.1 10*3/uL (ref 1.7–7.7)
Neutrophils Relative %: 70 %
Platelet Count: 88 10*3/uL — ABNORMAL LOW (ref 150–400)
RBC: 4.21 MIL/uL (ref 3.87–5.11)
RDW: 13.1 % (ref 11.5–15.5)
WBC Count: 4.4 10*3/uL (ref 4.0–10.5)
nRBC: 0 % (ref 0.0–0.2)

## 2019-01-19 MED ORDER — PROCHLORPERAZINE MALEATE 10 MG PO TABS
ORAL_TABLET | ORAL | Status: AC
  Filled 2019-01-19: qty 1

## 2019-01-19 MED ORDER — PROCHLORPERAZINE MALEATE 10 MG PO TABS
10.0000 mg | ORAL_TABLET | Freq: Once | ORAL | Status: AC
  Administered 2019-01-19: 10 mg via ORAL

## 2019-01-19 MED ORDER — BORTEZOMIB CHEMO SQ INJECTION 3.5 MG (2.5MG/ML)
1.3000 mg/m2 | Freq: Once | INTRAMUSCULAR | Status: AC
  Administered 2019-01-19: 10:00:00 2.75 mg via SUBCUTANEOUS
  Filled 2019-01-19: qty 1.1

## 2019-01-19 NOTE — Progress Notes (Signed)
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  PRIOR THERAPY: None  CURRENT THERAPY: Systemic therapy with Velcade 1.3 mg/M2 weekly, Revlimid 25 mg p.o. daily for 14 days every 3 weeks in addition to weekly Decadron 40 mg orally. First dose of treatment 10/08/2017.Treatment was placed on hold due to development of a significant rash.Sheresumedtreatment with dexamethasone and Velcade on 11/19/2017. She will not resume treatment of Revlimid due to the rash.  Status post 13 months of treatment.  INTERVAL HISTORY: Debbie Chan 68 y.o. female returns to the clinic today for follow-up visit.  The patient is feeling fine with no concerning complaints except for the fatigue and the peripheral neuropathy.  She denied having any current chest pain, shortness of breath, cough or hemoptysis.  She has no nausea, vomiting, diarrhea or constipation.  She has no weight loss or night sweats.  She continues to tolerate her treatment with Velcade and Decadron fairly well.  She had repeat myeloma panel performed recently and she is here for evaluation and discussion of her scan results.  MEDICAL HISTORY:No past medical history on file.  ALLERGIES:  has No Known Allergies.  MEDICATIONS:  Current Outpatient Medications  Medication Sig Dispense Refill  . acyclovir (ZOVIRAX) 200 MG capsule TAKE 1 CAPSULE BY MOUTH 2 TIMES DAILY. 60 capsule 2  . blood glucose meter kit and supplies Dispense based on patient and insurance preference. Use up to four times daily as directed. (FOR ICD-10 E10.9, E11.9). 1 each 0  . dexamethasone (DECADRON) 4 MG tablet Take 10 tablets (40 mg total) by mouth every 7 (seven) days. 40 tablet 3  . diphenhydrAMINE (BENADRYL ALLERGY) 25 MG tablet Take 1 tablet (25 mg total) by mouth every 6 (six) hours as  needed for itching. 100 tablet 0  . furosemide (LASIX) 20 MG tablet Take once daily for 3 days for lower extremity edema, then stop. Repeat as needed for recurrent edema. 30 tablet 2  . gabapentin (NEURONTIN) 100 MG capsule Take 1 capsule (100 mg total) by mouth 3 (three) times daily. 90 capsule 2  . metoprolol tartrate (LOPRESSOR) 25 MG tablet Take 0.5 tablets (12.5 mg total) by mouth 2 (two) times daily. 30 tablet 0  . omeprazole (PRILOSEC) 40 MG capsule Take 1 capsule (40 mg total) by mouth daily. 30 capsule 2  . oxyCODONE-acetaminophen (PERCOCET/ROXICET) 5-325 MG tablet Take 1 tablet by mouth every 8 (eight) hours as needed for severe pain. 30 tablet 0  . polyethylene glycol (MIRALAX / GLYCOLAX) packet Take 17 g by mouth daily. 14 each 0  . prochlorperazine (COMPAZINE) 10 MG tablet Take 1 tablet (10 mg total) by mouth every 6 (six) hours as needed for nausea or vomiting. 30 tablet 0  . RELION GLUCOSE TEST STRIPS test strip USE 1 TO CHECK GLUCOSE ONCE DAILY    . ReliOn Ultra Thin Lancets 30G MISC USE 1 TO CHECK GLUCOSE ONCE DAILY FOR GLUCOSE TESTING    . traZODone (DESYREL) 50 MG tablet Take 1 tablet (50 mg total) by mouth at bedtime. 30 tablet 0   No current facility-administered medications for this visit.     SURGICAL HISTORY: No past surgical history on file.  REVIEW OF SYSTEMS:  Constitutional: positive for fatigue Eyes: negative Ears, nose, mouth, throat, and face: negative Respiratory: negative Cardiovascular: negative Gastrointestinal: negative Genitourinary:negative Integument/breast: negative  Hematologic/lymphatic: negative Musculoskeletal:negative Neurological: positive for paresthesia Behavioral/Psych: negative Endocrine: negative Allergic/Immunologic: negative   PHYSICAL EXAMINATION: General appearance: alert, cooperative, fatigued and no distress Head: Normocephalic, without obvious abnormality, atraumatic Neck: no adenopathy, no JVD, supple, symmetrical, trachea  midline and thyroid not enlarged, symmetric, no tenderness/mass/nodules Lymph nodes: Cervical, supraclavicular, and axillary nodes normal. Resp: clear to auscultation bilaterally Back: symmetric, no curvature. ROM normal. No CVA tenderness. Cardio: regular rate and rhythm, S1, S2 normal, no murmur, click, rub or gallop GI: soft, non-tender; bowel sounds normal; no masses,  no organomegaly Extremities: extremities normal, atraumatic, no cyanosis or edema Neurologic: Alert and oriented X 3, normal strength and tone. Normal symmetric reflexes. Normal coordination and gait  ECOG PERFORMANCE STATUS: 1 - Symptomatic but completely ambulatory  Blood pressure (!) 149/74, pulse 83, temperature 97.8 F (36.6 C), temperature source Oral, resp. rate 17, height _0  (1.727 m), weight 210 lb 14.4 oz (95.7 kg), SpO2 100 %.  LABORATORY DATA: Lab Results  Component Value Date   WBC 4.4 01/19/2019   HGB 13.2 01/19/2019   HCT 38.5 01/19/2019   MCV 91.4 01/19/2019   PLT 88 (L) 01/19/2019      Chemistry      Component Value Date/Time   NA 141 01/13/2019 0825   K 4.2 01/13/2019 0825   CL 103 01/13/2019 0825   CO2 28 01/13/2019 0825   BUN 16 01/13/2019 0825   CREATININE 1.33 (H) 01/13/2019 0825      Component Value Date/Time   CALCIUM 9.0 01/13/2019 0825   ALKPHOS 69 01/13/2019 0825   AST 16 01/13/2019 0825   ALT 13 01/13/2019 0825   BILITOT 0.4 01/13/2019 0825       RADIOGRAPHIC STUDIES: No results found.  ASSESSMENT AND PLAN: This is a very pleasant 68 years old African female originally from [place] who is visiting her son in [place] and was recently diagnosed with IgA multiple myeloma. The patient was a started initially on treatment with weekly subcutaneous Velcade 1.3 mg/M2, Revlimid 25 mg p.o. daily for 21 days every 4 weeks as well as weekly Decadron 40 mg orally.  Revlimid was discontinued secondary to hypersensitivity reaction with significant skin rash.  She is  currently on treatment with weekly Velcade and Decadron. She has been tolerating this treatment well except for mild peripheral neuropathy. She had repeat myeloma panel performed recently.  I discussed the lab results with the patient today.  In general her myeloma panel showed no concerning significant increase. I gave her the option of continuous treatment with the current Velcade and Decadron versus switching her treatment to Pomalyst and Decadron.  The patient has no insurance coverage and it may be an issue starting her on Pomalyst at this point. She will continue her current treatment with subcutaneous Velcade and Decadron. For the peripheral neuropathy, she will continue on her current treatment with gabapentin. I will see her back for follow-up visit in 4 weeks for evaluation and management of any adverse effect of her treatment. The patient was advised to call immediately if she has any concerning symptoms in the interval. The patient voices understanding of current disease status and treatment options and is in agreement with the current care plan. All questions were answered. The patient knows to call the clinic with any problems, questions or concerns. We can certainly see the patient much sooner if necessary.  Disclaimer: This note was dictated with voice recognition software. Similar sounding words can inadvertently be transcribed and may not  be corrected upon review.

## 2019-01-19 NOTE — Patient Instructions (Signed)
[facility] Discharge Instructions for Patients Receiving Chemotherapy  Today you received the following chemotherapy agents: Bortezomib (VELCADE).  To help prevent nausea and vomiting after your treatment, we encourage you to take your nausea medication as prescribed.   If you develop nausea and vomiting that is not controlled by your nausea medication, call the clinic.   BELOW ARE SYMPTOMS THAT SHOULD BE REPORTED IMMEDIATELY:  *FEVER GREATER THAN 100.5 F  *CHILLS WITH OR WITHOUT FEVER  NAUSEA AND VOMITING THAT IS NOT CONTROLLED WITH YOUR NAUSEA MEDICATION  *UNUSUAL SHORTNESS OF BREATH  *UNUSUAL BRUISING OR BLEEDING  TENDERNESS IN MOUTH AND THROAT WITH OR WITHOUT PRESENCE OF ULCERS  *URINARY PROBLEMS  *BOWEL PROBLEMS  UNUSUAL RASH Items with * indicate a potential emergency and should be followed up as soon as possible.  Feel free to call the clinic should you have any questions or concerns. The clinic phone number is [phone].  Please show the CHEMO ALERT CARD at check-in to the Emergency Department and triage nurse.   Coronavirus (COVID-19) Are you at risk?  Are you at risk for the Coronavirus (COVID-19)?  To be considered HIGH RISK for Coronavirus (COVID-19), you have to meet the following criteria:  . Traveled to [place], [place], [place], [place] or [place]; or in the [place] to [place], [place], [place], or [place]; and have fever, cough, and shortness of breath within the last 2 weeks of travel OR . Been in close contact with a person diagnosed with COVID-19 within the last 2 weeks and have fever, cough, and shortness of breath . IF YOU DO NOT MEET THESE CRITERIA, YOU ARE CONSIDERED LOW RISK FOR COVID-19.  What to do if you are HIGH RISK for COVID-19?  . If you are having a medical emergency, call 911. . Seek medical care right away. Before you go to a doctor's office, urgent care or emergency department, call ahead and tell them  about your recent travel, contact with someone diagnosed with COVID-19, and your symptoms. You should receive instructions from your physician's office regarding next steps of care.  . When you arrive at healthcare provider, tell the healthcare staff immediately you have returned from visiting [place], [place], [place], [place] or [place]; or traveled in the [place] to [place], [place], [place], or [place]; in the last two weeks or you have been in close contact with a person diagnosed with COVID-19 in the last 2 weeks.   . Tell the health care staff about your symptoms: fever, cough and shortness of breath. . After you have been seen by a medical provider, you will be either: o Tested for (COVID-19) and discharged home on quarantine except to seek medical care if symptoms worsen, and asked to  - Stay home and avoid contact with others until you get your results (4-5 days)  - Avoid travel on public transportation if possible (such as bus, train, or airplane) or o Sent to the Emergency Department by EMS for evaluation, COVID-19 testing, and possible admission depending on your condition and test results.  What to do if you are LOW RISK for COVID-19?  Reduce your risk of any infection by using the same precautions used for avoiding the common cold or flu:  . Wash your hands often with soap and warm water for at least 20 seconds.  If soap and water are not readily available, use an alcohol-based hand sanitizer with at least 60% alcohol.  . If coughing   or sneezing, cover your mouth and nose by coughing or sneezing into the elbow areas of your shirt or coat, into a tissue or into your sleeve (not your hands). . Avoid shaking hands with others and consider head nods or verbal greetings only. . Avoid touching your eyes, nose, or mouth with unwashed hands.  . Avoid close contact with people who are sick. . Avoid places or events with large numbers of people in one location, like concerts or  sporting events. . Carefully consider travel plans you have or are making. . If you are planning any travel outside or inside the [place], visit the CDC's Travelers' Health webpage for the latest health notices. . If you have some symptoms but not all symptoms, continue to monitor at home and seek medical attention if your symptoms worsen. . If you are having a medical emergency, call 911.   ADDITIONAL HEALTHCARE OPTIONS FOR PATIENTS  [facility] / e-Visit: [email or web address]         [facility]: [phone]  [facility]: [phone]                   [facility]: [phone]   

## 2019-01-19 NOTE — Progress Notes (Signed)
OK to treat with today's lab results.

## 2019-01-21 ENCOUNTER — Telehealth: Payer: Self-pay | Admitting: Internal Medicine

## 2019-01-21 NOTE — Telephone Encounter (Signed)
Scheduled per los. Called and left msg. Mailed printout  °

## 2019-01-27 ENCOUNTER — Other Ambulatory Visit: Payer: Self-pay

## 2019-01-27 ENCOUNTER — Inpatient Hospital Stay: Payer: Self-pay | Attending: Internal Medicine

## 2019-01-27 ENCOUNTER — Inpatient Hospital Stay: Payer: Self-pay

## 2019-01-27 VITALS — BP 135/72 | HR 75 | Temp 97.8°F | Resp 18 | Wt 212.5 lb

## 2019-01-27 DIAGNOSIS — C9000 Multiple myeloma not having achieved remission: Secondary | ICD-10-CM

## 2019-01-27 DIAGNOSIS — Z5111 Encounter for antineoplastic chemotherapy: Secondary | ICD-10-CM | POA: Insufficient documentation

## 2019-01-27 DIAGNOSIS — M25552 Pain in left hip: Secondary | ICD-10-CM | POA: Insufficient documentation

## 2019-01-27 DIAGNOSIS — R21 Rash and other nonspecific skin eruption: Secondary | ICD-10-CM | POA: Insufficient documentation

## 2019-01-27 DIAGNOSIS — G629 Polyneuropathy, unspecified: Secondary | ICD-10-CM | POA: Insufficient documentation

## 2019-01-27 DIAGNOSIS — Z79899 Other long term (current) drug therapy: Secondary | ICD-10-CM | POA: Insufficient documentation

## 2019-01-27 LAB — CMP (CANCER CENTER ONLY)
ALT: 13 U/L (ref 0–44)
AST: 19 U/L (ref 15–41)
Albumin: 3.9 g/dL (ref 3.5–5.0)
Alkaline Phosphatase: 75 U/L (ref 38–126)
Anion gap: 10 (ref 5–15)
BUN: 18 mg/dL (ref 8–23)
CO2: 28 mmol/L (ref 22–32)
Calcium: 9.2 mg/dL (ref 8.9–10.3)
Chloride: 105 mmol/L (ref 98–111)
Creatinine: 1.34 mg/dL — ABNORMAL HIGH (ref 0.44–1.00)
GFR, Est AFR Am: 47 mL/min — ABNORMAL LOW (ref >60)
GFR, Estimated: 41 mL/min — ABNORMAL LOW (ref >60)
Glucose, Bld: 76 mg/dL (ref 70–99)
Potassium: 4 mmol/L (ref 3.5–5.1)
Sodium: 143 mmol/L (ref 135–145)
Total Bilirubin: 0.4 mg/dL (ref 0.3–1.2)
Total Protein: 7.6 g/dL (ref 6.5–8.1)

## 2019-01-27 LAB — CBC WITH DIFFERENTIAL (CANCER CENTER ONLY)
Abs Immature Granulocytes: 0.02 10*3/uL (ref 0.00–0.07)
Basophils Absolute: 0 10*3/uL (ref 0.0–0.1)
Basophils Relative: 1 %
Eosinophils Absolute: 0.1 10*3/uL (ref 0.0–0.5)
Eosinophils Relative: 1 %
HCT: 40.1 % (ref 36.0–46.0)
Hemoglobin: 13.4 g/dL (ref 12.0–15.0)
Immature Granulocytes: 0 %
Lymphocytes Relative: 14 %
Lymphs Abs: 0.8 10*3/uL (ref 0.7–4.0)
MCH: 31.4 pg (ref 26.0–34.0)
MCHC: 33.4 g/dL (ref 30.0–36.0)
MCV: 93.9 fL (ref 80.0–100.0)
Monocytes Absolute: 0.3 10*3/uL (ref 0.1–1.0)
Monocytes Relative: 6 %
Neutro Abs: 4.4 10*3/uL (ref 1.7–7.7)
Neutrophils Relative %: 78 %
Platelet Count: 104 10*3/uL — ABNORMAL LOW (ref 150–400)
RBC: 4.27 MIL/uL (ref 3.87–5.11)
RDW: 13.2 % (ref 11.5–15.5)
WBC Count: 5.6 10*3/uL (ref 4.0–10.5)
nRBC: 0 % (ref 0.0–0.2)

## 2019-01-27 MED ORDER — BORTEZOMIB CHEMO SQ INJECTION 3.5 MG (2.5MG/ML)
1.3000 mg/m2 | Freq: Once | INTRAMUSCULAR | Status: AC
  Administered 2019-01-27: 2.75 mg via SUBCUTANEOUS
  Filled 2019-01-27: qty 1.1

## 2019-01-27 MED ORDER — PROCHLORPERAZINE MALEATE 10 MG PO TABS
ORAL_TABLET | ORAL | Status: AC
  Filled 2019-01-27: qty 1

## 2019-01-27 MED ORDER — PROCHLORPERAZINE MALEATE 10 MG PO TABS
10.0000 mg | ORAL_TABLET | Freq: Once | ORAL | Status: AC
  Administered 2019-01-27: 10 mg via ORAL

## 2019-01-27 NOTE — Patient Instructions (Signed)
[facility] Discharge Instructions for Patients Receiving Chemotherapy  Today you received the following chemotherapy agents: Bortezomib (VELCADE).  To help prevent nausea and vomiting after your treatment, we encourage you to take your nausea medication as prescribed.   If you develop nausea and vomiting that is not controlled by your nausea medication, call the clinic.   BELOW ARE SYMPTOMS THAT SHOULD BE REPORTED IMMEDIATELY:  *FEVER GREATER THAN 100.5 F  *CHILLS WITH OR WITHOUT FEVER  NAUSEA AND VOMITING THAT IS NOT CONTROLLED WITH YOUR NAUSEA MEDICATION  *UNUSUAL SHORTNESS OF BREATH  *UNUSUAL BRUISING OR BLEEDING  TENDERNESS IN MOUTH AND THROAT WITH OR WITHOUT PRESENCE OF ULCERS  *URINARY PROBLEMS  *BOWEL PROBLEMS  UNUSUAL RASH Items with * indicate a potential emergency and should be followed up as soon as possible.  Feel free to call the clinic should you have any questions or concerns. The clinic phone number is [phone].  Please show the CHEMO ALERT CARD at check-in to the Emergency Department and triage nurse.   Coronavirus (COVID-19) Are you at risk?  Are you at risk for the Coronavirus (COVID-19)?  To be considered HIGH RISK for Coronavirus (COVID-19), you have to meet the following criteria:  . Traveled to [place], [place], [place], [place] or [place]; or in the [place] to [place], [place], [place], or [place]; and have fever, cough, and shortness of breath within the last 2 weeks of travel OR . Been in close contact with a person diagnosed with COVID-19 within the last 2 weeks and have fever, cough, and shortness of breath . IF YOU DO NOT MEET THESE CRITERIA, YOU ARE CONSIDERED LOW RISK FOR COVID-19.  What to do if you are HIGH RISK for COVID-19?  . If you are having a medical emergency, call 911. . Seek medical care right away. Before you go to a doctor's office, urgent care or emergency department, call ahead and tell them  about your recent travel, contact with someone diagnosed with COVID-19, and your symptoms. You should receive instructions from your physician's office regarding next steps of care.  . When you arrive at healthcare provider, tell the healthcare staff immediately you have returned from visiting [place], [place], [place], [place] or [place]; or traveled in the [place] to [place], [place], [place], or [place]; in the last two weeks or you have been in close contact with a person diagnosed with COVID-19 in the last 2 weeks.   . Tell the health care staff about your symptoms: fever, cough and shortness of breath. . After you have been seen by a medical provider, you will be either: o Tested for (COVID-19) and discharged home on quarantine except to seek medical care if symptoms worsen, and asked to  - Stay home and avoid contact with others until you get your results (4-5 days)  - Avoid travel on public transportation if possible (such as bus, train, or airplane) or o Sent to the Emergency Department by EMS for evaluation, COVID-19 testing, and possible admission depending on your condition and test results.  What to do if you are LOW RISK for COVID-19?  Reduce your risk of any infection by using the same precautions used for avoiding the common cold or flu:  . Wash your hands often with soap and warm water for at least 20 seconds.  If soap and water are not readily available, use an alcohol-based hand sanitizer with at least 60% alcohol.  . If coughing   or sneezing, cover your mouth and nose by coughing or sneezing into the elbow areas of your shirt or coat, into a tissue or into your sleeve (not your hands). . Avoid shaking hands with others and consider head nods or verbal greetings only. . Avoid touching your eyes, nose, or mouth with unwashed hands.  . Avoid close contact with people who are sick. . Avoid places or events with large numbers of people in one location, like concerts or  sporting events. . Carefully consider travel plans you have or are making. . If you are planning any travel outside or inside the US, visit the CDC's Travelers' Health webpage for the latest health notices. . If you have some symptoms but not all symptoms, continue to monitor at home and seek medical attention if your symptoms worsen. . If you are having a medical emergency, call 911.   ADDITIONAL HEALTHCARE OPTIONS FOR PATIENTS  [facility] / e-Visit: [email or web address]         [facility]: [phone]  [facility]: [phone]                   [facility]: [phone]   

## 2019-02-03 ENCOUNTER — Inpatient Hospital Stay: Payer: Self-pay

## 2019-02-03 ENCOUNTER — Other Ambulatory Visit: Payer: Self-pay

## 2019-02-03 ENCOUNTER — Other Ambulatory Visit: Payer: Self-pay | Admitting: Internal Medicine

## 2019-02-03 VITALS — BP 141/72 | HR 66 | Temp 98.0°F | Resp 18 | Wt 211.5 lb

## 2019-02-03 DIAGNOSIS — C9000 Multiple myeloma not having achieved remission: Secondary | ICD-10-CM

## 2019-02-03 DIAGNOSIS — K219 Gastro-esophageal reflux disease without esophagitis: Secondary | ICD-10-CM

## 2019-02-03 LAB — CBC WITH DIFFERENTIAL (CANCER CENTER ONLY)
Abs Immature Granulocytes: 0.02 10*3/uL (ref 0.00–0.07)
Basophils Absolute: 0 10*3/uL (ref 0.0–0.1)
Basophils Relative: 0 %
Eosinophils Absolute: 0 10*3/uL (ref 0.0–0.5)
Eosinophils Relative: 1 %
HCT: 38.9 % (ref 36.0–46.0)
Hemoglobin: 13.3 g/dL (ref 12.0–15.0)
Immature Granulocytes: 0 %
Lymphocytes Relative: 14 %
Lymphs Abs: 0.7 10*3/uL (ref 0.7–4.0)
MCH: 31.3 pg (ref 26.0–34.0)
MCHC: 34.2 g/dL (ref 30.0–36.0)
MCV: 91.5 fL (ref 80.0–100.0)
Monocytes Absolute: 0.2 10*3/uL (ref 0.1–1.0)
Monocytes Relative: 4 %
Neutro Abs: 4.2 10*3/uL (ref 1.7–7.7)
Neutrophils Relative %: 81 %
Platelet Count: 86 10*3/uL — ABNORMAL LOW (ref 150–400)
RBC: 4.25 MIL/uL (ref 3.87–5.11)
RDW: 13.2 % (ref 11.5–15.5)
WBC Count: 5.2 10*3/uL (ref 4.0–10.5)
nRBC: 0 % (ref 0.0–0.2)

## 2019-02-03 LAB — CMP (CANCER CENTER ONLY)
ALT: 13 U/L (ref 0–44)
AST: 21 U/L (ref 15–41)
Albumin: 3.9 g/dL (ref 3.5–5.0)
Alkaline Phosphatase: 67 U/L (ref 38–126)
Anion gap: 12 (ref 5–15)
BUN: 16 mg/dL (ref 8–23)
CO2: 26 mmol/L (ref 22–32)
Calcium: 9.2 mg/dL (ref 8.9–10.3)
Chloride: 105 mmol/L (ref 98–111)
Creatinine: 1.33 mg/dL — ABNORMAL HIGH (ref 0.44–1.00)
GFR, Est AFR Am: 47 mL/min — ABNORMAL LOW (ref >60)
GFR, Estimated: 41 mL/min — ABNORMAL LOW (ref >60)
Glucose, Bld: 88 mg/dL (ref 70–99)
Potassium: 5.1 mmol/L (ref 3.5–5.1)
Sodium: 143 mmol/L (ref 135–145)
Total Bilirubin: 0.4 mg/dL (ref 0.3–1.2)
Total Protein: 7.8 g/dL (ref 6.5–8.1)

## 2019-02-03 MED ORDER — METOPROLOL TARTRATE 25 MG PO TABS: 12.5000 mg | ORAL_TABLET | Freq: Two times a day (BID) | ORAL | 0 refills | Status: DC

## 2019-02-03 MED ORDER — ACYCLOVIR 200 MG PO CAPS: ORAL_CAPSULE | ORAL | 2 refills | Status: DC

## 2019-02-03 MED ORDER — BORTEZOMIB CHEMO SQ INJECTION 3.5 MG (2.5MG/ML)
1.3000 mg/m2 | Freq: Once | INTRAMUSCULAR | Status: AC
  Administered 2019-02-03: 2.75 mg via SUBCUTANEOUS
  Filled 2019-02-03: qty 1.1

## 2019-02-03 MED ORDER — PROCHLORPERAZINE MALEATE 10 MG PO TABS
ORAL_TABLET | ORAL | Status: AC
  Filled 2019-02-03: qty 1

## 2019-02-03 MED ORDER — DEXAMETHASONE 4 MG PO TABS: 40.0000 mg | ORAL_TABLET | ORAL | 3 refills | Status: DC

## 2019-02-03 MED ORDER — OMEPRAZOLE 40 MG PO CPDR: 40.0000 mg | DELAYED_RELEASE_CAPSULE | Freq: Every day | ORAL | 2 refills | Status: DC

## 2019-02-03 MED ORDER — PROCHLORPERAZINE MALEATE 10 MG PO TABS
10.0000 mg | ORAL_TABLET | Freq: Once | ORAL | Status: AC
  Administered 2019-02-03: 10 mg via ORAL

## 2019-02-03 MED FILL — METOPROLOL TARTRATE 25 MG T: 25 | 30 days supply | Qty: 30 | Fill #0

## 2019-02-03 MED FILL — OMEPRAZOLE 40 MG CPDR: 40 | 30 days supply | Qty: 30 | Fill #0

## 2019-02-03 MED FILL — DEXAMETHASONE 4 MG TABLET: 4 | 28 days supply | Qty: 40 | Fill #0

## 2019-02-03 MED FILL — ACYCLOVIR 200 MG CAP: 200 | 30 days supply | Qty: 60 | Fill #0

## 2019-02-03 NOTE — Patient Instructions (Signed)
[facility] Discharge Instructions for Patients Receiving Chemotherapy  Today you received the following chemotherapy agents: Bortezomib (VELCADE).  To help prevent nausea and vomiting after your treatment, we encourage you to take your nausea medication as prescribed.   If you develop nausea and vomiting that is not controlled by your nausea medication, call the clinic.   BELOW ARE SYMPTOMS THAT SHOULD BE REPORTED IMMEDIATELY:  *FEVER GREATER THAN 100.5 F  *CHILLS WITH OR WITHOUT FEVER  NAUSEA AND VOMITING THAT IS NOT CONTROLLED WITH YOUR NAUSEA MEDICATION  *UNUSUAL SHORTNESS OF BREATH  *UNUSUAL BRUISING OR BLEEDING  TENDERNESS IN MOUTH AND THROAT WITH OR WITHOUT PRESENCE OF ULCERS  *URINARY PROBLEMS  *BOWEL PROBLEMS  UNUSUAL RASH Items with * indicate a potential emergency and should be followed up as soon as possible.  Feel free to call the clinic should you have any questions or concerns. The clinic phone number is [phone].  Please show the CHEMO ALERT CARD at check-in to the Emergency Department and triage nurse.   Coronavirus (COVID-19) Are you at risk?  Are you at risk for the Coronavirus (COVID-19)?  To be considered HIGH RISK for Coronavirus (COVID-19), you have to meet the following criteria:  . Traveled to [place], [place], [place], [place] or [place]; or in the [place] to [place], [place], [place], or [place]; and have fever, cough, and shortness of breath within the last 2 weeks of travel OR . Been in close contact with a person diagnosed with COVID-19 within the last 2 weeks and have fever, cough, and shortness of breath . IF YOU DO NOT MEET THESE CRITERIA, YOU ARE CONSIDERED LOW RISK FOR COVID-19.  What to do if you are HIGH RISK for COVID-19?  . If you are having a medical emergency, call 911. . Seek medical care right away. Before you go to a doctor's office, urgent care or emergency department, call ahead and tell them  about your recent travel, contact with someone diagnosed with COVID-19, and your symptoms. You should receive instructions from your physician's office regarding next steps of care.  . When you arrive at healthcare provider, tell the healthcare staff immediately you have returned from visiting [place], [place], [place], [place] or [place]; or traveled in the [place] to [place], [place], [place], or [place]; in the last two weeks or you have been in close contact with a person diagnosed with COVID-19 in the last 2 weeks.   . Tell the health care staff about your symptoms: fever, cough and shortness of breath. . After you have been seen by a medical provider, you will be either: o Tested for (COVID-19) and discharged home on quarantine except to seek medical care if symptoms worsen, and asked to  - Stay home and avoid contact with others until you get your results (4-5 days)  - Avoid travel on public transportation if possible (such as bus, train, or airplane) or o Sent to the Emergency Department by EMS for evaluation, COVID-19 testing, and possible admission depending on your condition and test results.  What to do if you are LOW RISK for COVID-19?  Reduce your risk of any infection by using the same precautions used for avoiding the common cold or flu:  . Wash your hands often with soap and warm water for at least 20 seconds.  If soap and water are not readily available, use an alcohol-based hand sanitizer with at least 60% alcohol.  . If coughing   or sneezing, cover your mouth and nose by coughing or sneezing into the elbow areas of your shirt or coat, into a tissue or into your sleeve (not your hands). . Avoid shaking hands with others and consider head nods or verbal greetings only. . Avoid touching your eyes, nose, or mouth with unwashed hands.  . Avoid close contact with people who are sick. . Avoid places or events with large numbers of people in one location, like concerts or  sporting events. . Carefully consider travel plans you have or are making. . If you are planning any travel outside or inside the [place], visit the CDC's Travelers' Health webpage for the latest health notices. . If you have some symptoms but not all symptoms, continue to monitor at home and seek medical attention if your symptoms worsen. . If you are having a medical emergency, call 911.   ADDITIONAL HEALTHCARE OPTIONS FOR PATIENTS  [facility] / e-Visit: [email or web address]         [facility]: [phone]  [facility]: [phone]                   [facility]: [phone]   

## 2019-02-10 ENCOUNTER — Other Ambulatory Visit: Payer: Self-pay

## 2019-02-10 ENCOUNTER — Inpatient Hospital Stay: Payer: Self-pay

## 2019-02-10 VITALS — BP 134/76 | HR 75 | Temp 97.9°F | Resp 16 | Wt 210.5 lb

## 2019-02-10 DIAGNOSIS — C9000 Multiple myeloma not having achieved remission: Secondary | ICD-10-CM

## 2019-02-10 LAB — CBC WITH DIFFERENTIAL (CANCER CENTER ONLY)
Abs Immature Granulocytes: 0.01 10*3/uL (ref 0.00–0.07)
Basophils Absolute: 0 10*3/uL (ref 0.0–0.1)
Basophils Relative: 0 %
Eosinophils Absolute: 0 10*3/uL (ref 0.0–0.5)
Eosinophils Relative: 1 %
HCT: 39.3 % (ref 36.0–46.0)
Hemoglobin: 13.3 g/dL (ref 12.0–15.0)
Immature Granulocytes: 0 %
Lymphocytes Relative: 15 %
Lymphs Abs: 0.7 10*3/uL (ref 0.7–4.0)
MCH: 31.4 pg (ref 26.0–34.0)
MCHC: 33.8 g/dL (ref 30.0–36.0)
MCV: 92.9 fL (ref 80.0–100.0)
Monocytes Absolute: 0.2 10*3/uL (ref 0.1–1.0)
Monocytes Relative: 5 %
Neutro Abs: 3.3 10*3/uL (ref 1.7–7.7)
Neutrophils Relative %: 79 %
Platelet Count: 96 10*3/uL — ABNORMAL LOW (ref 150–400)
RBC: 4.23 MIL/uL (ref 3.87–5.11)
RDW: 13.2 % (ref 11.5–15.5)
WBC Count: 4.2 10*3/uL (ref 4.0–10.5)
nRBC: 0 % (ref 0.0–0.2)

## 2019-02-10 LAB — CMP (CANCER CENTER ONLY)
ALT: 10 U/L (ref 0–44)
AST: 15 U/L (ref 15–41)
Albumin: 3.7 g/dL (ref 3.5–5.0)
Alkaline Phosphatase: 75 U/L (ref 38–126)
Anion gap: 13 (ref 5–15)
BUN: 19 mg/dL (ref 8–23)
CO2: 25 mmol/L (ref 22–32)
Calcium: 8.9 mg/dL (ref 8.9–10.3)
Chloride: 104 mmol/L (ref 98–111)
Creatinine: 1.31 mg/dL — ABNORMAL HIGH (ref 0.44–1.00)
GFR, Est AFR Am: 48 mL/min — ABNORMAL LOW (ref >60)
GFR, Estimated: 42 mL/min — ABNORMAL LOW (ref >60)
Glucose, Bld: 97 mg/dL (ref 70–99)
Potassium: 4.4 mmol/L (ref 3.5–5.1)
Sodium: 142 mmol/L (ref 135–145)
Total Bilirubin: 0.4 mg/dL (ref 0.3–1.2)
Total Protein: 7.3 g/dL (ref 6.5–8.1)

## 2019-02-10 MED ORDER — BORTEZOMIB CHEMO SQ INJECTION 3.5 MG (2.5MG/ML)
1.3000 mg/m2 | Freq: Once | INTRAMUSCULAR | Status: AC
  Administered 2019-02-10: 2.75 mg via SUBCUTANEOUS
  Filled 2019-02-10: qty 1.1

## 2019-02-10 MED ORDER — PROCHLORPERAZINE MALEATE 10 MG PO TABS
10.0000 mg | ORAL_TABLET | Freq: Once | ORAL | Status: AC
  Administered 2019-02-10: 10 mg via ORAL

## 2019-02-10 MED ORDER — PROCHLORPERAZINE MALEATE 10 MG PO TABS
ORAL_TABLET | ORAL | Status: AC
  Filled 2019-02-10: qty 1

## 2019-02-10 NOTE — Progress Notes (Signed)
Per Dr. Julien Nordmann ok to treat today with platelets 96. Pt. states she took Dexamethasone 40 mg at home.

## 2019-02-10 NOTE — Patient Instructions (Signed)
[facility] Discharge Instructions for Patients Receiving Chemotherapy  Today you received the following chemotherapy agent: Velcade  To help prevent nausea and vomiting after your treatment, we encourage you to take your nausea medication as directed by your MD.   If you develop nausea and vomiting that is not controlled by your nausea medication, call the clinic.   BELOW ARE SYMPTOMS THAT SHOULD BE REPORTED IMMEDIATELY:  *FEVER GREATER THAN 100.5 F  *CHILLS WITH OR WITHOUT FEVER  NAUSEA AND VOMITING THAT IS NOT CONTROLLED WITH YOUR NAUSEA MEDICATION  *UNUSUAL SHORTNESS OF BREATH  *UNUSUAL BRUISING OR BLEEDING  TENDERNESS IN MOUTH AND THROAT WITH OR WITHOUT PRESENCE OF ULCERS  *URINARY PROBLEMS  *BOWEL PROBLEMS  UNUSUAL RASH Items with * indicate a potential emergency and should be followed up as soon as possible.  Feel free to call the clinic should you have any questions or concerns. The clinic phone number is [phone].  Please show the CHEMO ALERT CARD at check-in to the Emergency Department and triage nurse.  Coronavirus (COVID-19) Are you at risk?  Are you at risk for the Coronavirus (COVID-19)?  To be considered HIGH RISK for Coronavirus (COVID-19), you have to meet the following criteria:  . Traveled to [place], [place], [place], [place] or [place]; or in the [place] to [place], [place], [place], or [place]; and have fever, cough, and shortness of breath within the last 2 weeks of travel OR . Been in close contact with a person diagnosed with COVID-19 within the last 2 weeks and have fever, cough, and shortness of breath . IF YOU DO NOT MEET THESE CRITERIA, YOU ARE CONSIDERED LOW RISK FOR COVID-19.  What to do if you are HIGH RISK for COVID-19?  . If you are having a medical emergency, call 911. . Seek medical care right away. Before you go to a doctor's office, urgent care or emergency department, call ahead and tell them about  your recent travel, contact with someone diagnosed with COVID-19, and your symptoms. You should receive instructions from your physician's office regarding next steps of care.  . When you arrive at healthcare provider, tell the healthcare staff immediately you have returned from visiting [place], [place], [place], [place] or [place]; or traveled in the [place] to [place], [place], [place], or [place]; in the last two weeks or you have been in close contact with a person diagnosed with COVID-19 in the last 2 weeks.   . Tell the health care staff about your symptoms: fever, cough and shortness of breath. . After you have been seen by a medical provider, you will be either: o Tested for (COVID-19) and discharged home on quarantine except to seek medical care if symptoms worsen, and asked to  - Stay home and avoid contact with others until you get your results (4-5 days)  - Avoid travel on public transportation if possible (such as bus, train, or airplane) or o Sent to the Emergency Department by EMS for evaluation, COVID-19 testing, and possible admission depending on your condition and test results.  What to do if you are LOW RISK for COVID-19?  Reduce your risk of any infection by using the same precautions used for avoiding the common cold or flu:  . Wash your hands often with soap and warm water for at least 20 seconds.  If soap and water are not readily available, use an alcohol-based hand sanitizer with at least 60% alcohol.  . If   or sneezing, cover your mouth and nose by coughing or sneezing into the elbow areas of your shirt or coat, into a tissue or into your sleeve (not your hands). . Avoid shaking hands with others and consider head nods or verbal greetings only. . Avoid touching your eyes, nose, or mouth with unwashed hands.  . Avoid close contact with people who are sick. . Avoid places or events with large numbers of people in one location, like concerts or sporting  events. . Carefully consider travel plans you have or are making. . If you are planning any travel outside or inside the [place], visit the CDC's Travelers' Health webpage for the latest health notices. . If you have some symptoms but not all symptoms, continue to monitor at home and seek medical attention if your symptoms worsen. . If you are having a medical emergency, call 911.   ADDITIONAL HEALTHCARE OPTIONS FOR PATIENTS  [facility] / e-Visit: [email or web address]         [facility]: [phone]  [facility]: [phone]                   [facility]: [phone]    

## 2019-02-17 ENCOUNTER — Inpatient Hospital Stay: Payer: Self-pay

## 2019-02-17 ENCOUNTER — Ambulatory Visit (HOSPITAL_COMMUNITY)
Admission: RE | Admit: 2019-02-17 | Discharge: 2019-02-17 | Disposition: A | Discharge status: Home / Self Care | Payer: Self-pay | Source: Ambulatory Visit | Attending: Internal Medicine | Admitting: Internal Medicine

## 2019-02-17 ENCOUNTER — Other Ambulatory Visit: Payer: Self-pay | Admitting: Internal Medicine

## 2019-02-17 ENCOUNTER — Inpatient Hospital Stay (HOSPITAL_BASED_OUTPATIENT_CLINIC_OR_DEPARTMENT_OTHER): Payer: Self-pay | Admitting: Internal Medicine

## 2019-02-17 ENCOUNTER — Other Ambulatory Visit: Payer: Self-pay

## 2019-02-17 ENCOUNTER — Emergency Department (HOSPITAL_COMMUNITY)
Admission: EM | Admit: 2019-02-17 | Discharge: 2019-02-17 | Disposition: A | Discharge status: Home / Self Care | Payer: Self-pay | Attending: Emergency Medicine | Admitting: Emergency Medicine

## 2019-02-17 ENCOUNTER — Encounter: Payer: Self-pay | Admitting: Internal Medicine

## 2019-02-17 ENCOUNTER — Encounter (HOSPITAL_COMMUNITY): Payer: Self-pay

## 2019-02-17 VITALS — BP 140/77 | HR 79 | Temp 98.2°F | Resp 18 | Ht 68.0 in

## 2019-02-17 DIAGNOSIS — C9000 Multiple myeloma not having achieved remission: Secondary | ICD-10-CM

## 2019-02-17 DIAGNOSIS — W19XXXA Unspecified fall, initial encounter: Secondary | ICD-10-CM | POA: Insufficient documentation

## 2019-02-17 DIAGNOSIS — Z79899 Other long term (current) drug therapy: Secondary | ICD-10-CM | POA: Insufficient documentation

## 2019-02-17 DIAGNOSIS — Y999 Unspecified external cause status: Secondary | ICD-10-CM | POA: Insufficient documentation

## 2019-02-17 DIAGNOSIS — S32502A Unspecified fracture of left pubis, initial encounter for closed fracture: Secondary | ICD-10-CM | POA: Insufficient documentation

## 2019-02-17 DIAGNOSIS — Y939 Activity, unspecified: Secondary | ICD-10-CM | POA: Insufficient documentation

## 2019-02-17 DIAGNOSIS — Z8579 Personal history of other malignant neoplasms of lymphoid, hematopoietic and related tissues: Secondary | ICD-10-CM | POA: Insufficient documentation

## 2019-02-17 DIAGNOSIS — Y929 Unspecified place or not applicable: Secondary | ICD-10-CM | POA: Insufficient documentation

## 2019-02-17 HISTORY — DX: Malignant (primary) neoplasm, unspecified: C80.1

## 2019-02-17 LAB — CMP (CANCER CENTER ONLY)
ALT: 13 U/L (ref 0–44)
AST: 20 U/L (ref 15–41)
Albumin: 3.9 g/dL (ref 3.5–5.0)
Alkaline Phosphatase: 68 U/L (ref 38–126)
Anion gap: 11 (ref 5–15)
BUN: 16 mg/dL (ref 8–23)
CO2: 27 mmol/L (ref 22–32)
Calcium: 9.2 mg/dL (ref 8.9–10.3)
Chloride: 104 mmol/L (ref 98–111)
Creatinine: 1.22 mg/dL — ABNORMAL HIGH (ref 0.44–1.00)
GFR, Est AFR Am: 53 mL/min — ABNORMAL LOW (ref >60)
GFR, Estimated: 45 mL/min — ABNORMAL LOW (ref >60)
Glucose, Bld: 103 mg/dL — ABNORMAL HIGH (ref 70–99)
Potassium: 4.6 mmol/L (ref 3.5–5.1)
Sodium: 142 mmol/L (ref 135–145)
Total Bilirubin: 0.4 mg/dL (ref 0.3–1.2)
Total Protein: 7.6 g/dL (ref 6.5–8.1)

## 2019-02-17 LAB — CBC WITH DIFFERENTIAL (CANCER CENTER ONLY)
Abs Immature Granulocytes: 0.04 10*3/uL (ref 0.00–0.07)
Basophils Absolute: 0 10*3/uL (ref 0.0–0.1)
Basophils Relative: 0 %
Eosinophils Absolute: 0 10*3/uL (ref 0.0–0.5)
Eosinophils Relative: 0 %
HCT: 38.5 % (ref 36.0–46.0)
Hemoglobin: 12.9 g/dL (ref 12.0–15.0)
Immature Granulocytes: 1 %
Lymphocytes Relative: 7 %
Lymphs Abs: 0.5 10*3/uL — ABNORMAL LOW (ref 0.7–4.0)
MCH: 31.2 pg (ref 26.0–34.0)
MCHC: 33.5 g/dL (ref 30.0–36.0)
MCV: 93 fL (ref 80.0–100.0)
Monocytes Absolute: 0.3 10*3/uL (ref 0.1–1.0)
Monocytes Relative: 4 %
Neutro Abs: 7 10*3/uL (ref 1.7–7.7)
Neutrophils Relative %: 88 %
Platelet Count: 79 10*3/uL — ABNORMAL LOW (ref 150–400)
RBC: 4.14 MIL/uL (ref 3.87–5.11)
RDW: 13.2 % (ref 11.5–15.5)
WBC Count: 7.9 10*3/uL (ref 4.0–10.5)
nRBC: 0 % (ref 0.0–0.2)

## 2019-02-17 MED ORDER — BORTEZOMIB CHEMO SQ INJECTION 3.5 MG (2.5MG/ML)
1.3000 mg/m2 | Freq: Once | INTRAMUSCULAR | Status: AC
  Administered 2019-02-17: 10:00:00 2.75 mg via SUBCUTANEOUS
  Filled 2019-02-17: qty 1.1

## 2019-02-17 MED ORDER — HYDROMORPHONE HCL 1 MG/ML IJ SOLN
1.0000 mg | Freq: Once | INTRAMUSCULAR | Status: AC
  Administered 2019-02-17: 1 mg via INTRAMUSCULAR
  Filled 2019-02-17: qty 1

## 2019-02-17 MED ORDER — PROCHLORPERAZINE MALEATE 10 MG PO TABS: 10.0000 mg | ORAL_TABLET | Freq: Once | ORAL | Status: DC

## 2019-02-17 MED ORDER — OXYCODONE-ACETAMINOPHEN 5-325 MG PO TABS: 2.0000 | ORAL_TABLET | Freq: Four times a day (QID) | ORAL | 0 refills | Status: DC | PRN

## 2019-02-17 MED FILL — OXYCODONE-APAP 5-325MG: 5-325 | 3 days supply | Qty: 20 | Fill #0

## 2019-02-17 NOTE — Progress Notes (Signed)
Fell last night around 0830 pm . She could not get up . Pain in left hip and groin area . She is unable to walk. Son with pt.  Per Dr Julien Nordmann it is ok to treat pt today with Velcade and platelets of 79K.

## 2019-02-17 NOTE — ED Notes (Signed)
Order for xr dc due to having one earlier today.

## 2019-02-17 NOTE — ED Notes (Signed)
IV attempted x 2 . Pt only wanted to be stuck in L AC.

## 2019-02-17 NOTE — Discharge Planning (Signed)
Fuller Mandril, RN, BSN, [place] [phone] Pt qualifies for DME rolling walker.  DME  ordered through [facility].  [employer] [place] notified to deliver to pt room prior to D/C home.

## 2019-02-17 NOTE — Progress Notes (Signed)
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  PRIOR THERAPY: None  CURRENT THERAPY: Systemic therapy with Velcade 1.3 mg/M2 weekly, Revlimid 25 mg p.o. daily for 14 days every 3 weeks in addition to weekly Decadron 40 mg orally. First dose of treatment 10/08/2017.Treatment was placed on hold due to development of a significant rash.Sheresumedtreatment with dexamethasone and Velcade on 11/19/2017. She will not resume treatment of Revlimid due to the rash.  Status post 13 months of treatment.  INTERVAL HISTORY: Debbie Chan 68 y.o. female returns to the clinic today for follow-up visit.  The patient is feeling fine today with no concerning complaints except for pain in the left hip area after a fall last night.  She cannot bear weight on the left hip area during walking. 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  She denied having any current chest pain, shortness breath, cough or hemoptysis.  She denied having any fever or chills.  She has no nausea, vomiting, diarrhea or constipation.  She denied having any headache or visual changes.  She has no recent weight loss or night sweats.  She continues to tolerate her treatment with subcutaneous Velcade and Decadron fairly well.  MEDICAL HISTORY:No past medical history on file.  ALLERGIES:  has No Known Allergies.  MEDICATIONS:  Current Outpatient Medications  Medication Sig Dispense Refill  . acyclovir (ZOVIRAX) 200 MG capsule TAKE 1 CAPSULE BY MOUTH 2 TIMES DAILY. 60 capsule 2  . blood glucose meter kit and supplies Dispense based on patient and insurance preference. Use up to four times daily as directed. (FOR ICD-10 E10.9, E11.9). 1 each 0  . dexamethasone (DECADRON) 4 MG tablet Take 10 tablets  (40 mg total) by mouth every 7 (seven) days. 40 tablet 3  . diphenhydrAMINE (BENADRYL ALLERGY) 25 MG tablet Take 1 tablet (25 mg total) by mouth every 6 (six) hours as needed for itching. 100 tablet 0  . furosemide (LASIX) 20 MG tablet Take once daily for 3 days for lower extremity edema, then stop. Repeat as needed for recurrent edema. 30 tablet 2  . gabapentin (NEURONTIN) 100 MG capsule Take 1 capsule (100 mg total) by mouth 3 (three) times daily. 90 capsule 2  . metoprolol tartrate (LOPRESSOR) 25 MG tablet Take 0.5 tablets (12.5 mg total) by mouth 2 (two) times daily. 30 tablet 0  . omeprazole (PRILOSEC) 40 MG capsule Take 1 capsule (40 mg total) by mouth daily. 30 capsule 2  . oxyCODONE-acetaminophen (PERCOCET/ROXICET) 5-325 MG tablet Take 1 tablet by mouth every 8 (eight) hours as needed for severe pain. 30 tablet 0  . polyethylene glycol (MIRALAX / GLYCOLAX) packet Take 17 g by mouth daily. 14 each 0  . prochlorperazine (COMPAZINE) 10 MG tablet Take 1 tablet (10 mg total) by mouth every 6 (six) hours as needed for nausea or vomiting. 30 tablet 0  . RELION GLUCOSE TEST STRIPS test strip USE 1 TO CHECK GLUCOSE ONCE DAILY    . ReliOn Ultra Thin Lancets 30G MISC USE 1 TO CHECK GLUCOSE ONCE DAILY FOR GLUCOSE TESTING    . traZODone (DESYREL) 50 MG tablet Take 1 tablet (50 mg total) by mouth at bedtime. 30 tablet 0   No current facility-administered medications for this visit.    SURGICAL HISTORY:  No past surgical history on file.  REVIEW OF SYSTEMS:  Constitutional: positive for fatigue Eyes: negative Ears, nose, mouth, throat, and face: negative Respiratory: negative Cardiovascular: negative Gastrointestinal: negative Genitourinary:negative Integument/breast: negative Hematologic/lymphatic: negative Musculoskeletal:positive for bone pain Neurological: positive for paresthesia Behavioral/Psych: negative Endocrine: negative Allergic/Immunologic: negative   PHYSICAL EXAMINATION:  General appearance: alert, cooperative, fatigued and no distress Head: Normocephalic, without obvious abnormality, atraumatic Neck: no adenopathy, no JVD, supple, symmetrical, trachea midline and thyroid not enlarged, symmetric, no tenderness/mass/nodules Lymph nodes: Cervical, supraclavicular, and axillary nodes normal. Resp: clear to auscultation bilaterally Back: symmetric, no curvature. ROM normal. No CVA tenderness. Cardio: regular rate and rhythm, S1, S2 normal, no murmur, click, rub or gallop GI: soft, non-tender; bowel sounds normal; no masses,  no organomegaly Extremities: extremities normal, atraumatic, no cyanosis or edema Neurologic: Alert and oriented X 3, normal strength and tone. Normal symmetric reflexes. Normal coordination and gait  ECOG PERFORMANCE STATUS: 1 - Symptomatic but completely ambulatory  Blood pressure 140/77, pulse 79, temperature 98.2 F (36.8 C), temperature source Temporal, resp. rate 18, height '5\' 8"'$  (1.727 m), SpO2 99 %.  LABORATORY DATA: Lab Results  Component Value Date   WBC 4.2 02/10/2019   HGB 13.3 02/10/2019   HCT 39.3 02/10/2019   MCV 92.9 02/10/2019   PLT 96 (L) 02/10/2019      Chemistry      Component Value Date/Time   NA 142 02/10/2019 0822   K 4.4 02/10/2019 0822   CL 104 02/10/2019 0822   CO2 25 02/10/2019 0822   BUN 19 02/10/2019 0822   CREATININE 1.31 (H) 02/10/2019 0822      Component Value Date/Time   CALCIUM 8.9 02/10/2019 0822   ALKPHOS 75 02/10/2019 0822   AST 15 02/10/2019 0822   ALT 10 02/10/2019 0822   BILITOT 0.4 02/10/2019 0822       RADIOGRAPHIC STUDIES: No results found.  ASSESSMENT AND PLAN: This is a very pleasant 68 years old African female originally from [place] who is visiting her son in [place] and was recently diagnosed with IgA multiple myeloma. The patient was a started initially on treatment with weekly subcutaneous Velcade 1.3 mg/M2, Revlimid 25 mg p.o. daily for 21 days every  4 weeks as well as weekly Decadron 40 mg orally.  Revlimid was discontinued secondary to hypersensitivity reaction with significant skin rash.  She is currently on treatment with weekly Velcade and Decadron. The patient has been tolerating this treatment well with no concerning adverse effects. I recommended for her to proceed with her treatment today as planned. For the left hip pain and recent fall, I will order x-ray of the left hip to rule out any fracture today.  If the patient has any concerning fracture, she will be sent to the emergency department for further evaluation and management. For the peripheral neuropathy, she will continue on her current treatment with gabapentin. She will come back for follow-up visit in 4 weeks for evaluation and management of any adverse effect of her treatment. The patient was advised to call immediately if she has any concerning symptoms in the interval. The patient voices understanding of current disease status and treatment options and is in agreement with the current care plan. All questions were answered. The patient knows to call the clinic with any problems, questions or concerns. We can certainly see the patient much sooner if necessary.  Disclaimer: This note was dictated with voice recognition software. Similar sounding words can inadvertently be transcribed and  may not be corrected upon review.

## 2019-02-17 NOTE — ED Notes (Signed)
Pt provided walker.

## 2019-02-17 NOTE — ED Triage Notes (Signed)
Patient states that she fell when she tried to get up and she fell, landing on her left hip. Patient ahs increased pain with weight bearing.

## 2019-02-17 NOTE — Discharge Instructions (Signed)
Follow-up with Dr. Lucia Gaskins or your orthopedic surgeon next week for recheck of your pelvic fracture.  Try to stay off your leg is much as possible and when you do ambulate use the walker.

## 2019-02-17 NOTE — ED Provider Notes (Signed)
[place] DEPT Provider Note   CSN: [id number] Arrival date & time: 02/17/19  1110     History Chief Complaint  Patient presents with  . Fall  . Hip Pain    Debbie Chan is a 68 y.o. female.  Patient states she fell last night and hurt her left hip.  Patient did not hit her head.  The history is provided by the patient. No language interpreter was used.  Fall This is a new problem. The current episode started less than 1 hour ago. The problem occurs rarely. The problem has been resolved. Pertinent negatives include no chest pain, no abdominal pain and no headaches. Nothing aggravates the symptoms. Nothing relieves the symptoms. She has tried nothing for the symptoms. The treatment provided no relief.  Hip Pain Pertinent negatives include no chest pain, no abdominal pain and no headaches.       Past Medical History:  Diagnosis Date  . Cancer [facility])     Patient Active Problem List   Diagnosis Date Noted  . Encounter for antineoplastic chemotherapy 10/02/2017  . Goals of care, counseling/discussion 10/02/2017  . Multiple myeloma without remission ([place])   . Hepatomegaly   . Atrial flutter ([place])   . Chest pain 09/19/2017  . Tachycardia 09/19/2017  . Lytic bone lesions on xray 09/19/2017  . Anemia 09/19/2017  . AKI (acute kidney injury) ([place]) 09/19/2017  . Pleural effusion on right 09/19/2017    History reviewed. No pertinent surgical history.   OB History   No obstetric history on file.     History reviewed. No pertinent family history.  Social History   Tobacco Use  . Smoking status: Never Smoker  . Smokeless tobacco: Never Used  Substance Use Topics  . Alcohol use: Never  . Drug use: Never    Home Medications Prior to Admission medications   Medication Sig Start Date End Date Taking? Authorizing Provider  acyclovir (ZOVIRAX) 200 MG capsule TAKE 1 CAPSULE BY MOUTH 2 TIMES DAILY. 02/03/19   Curt Bears, MD  blood  glucose meter kit and supplies Dispense based on patient and insurance preference. Use up to four times daily as directed. (FOR ICD-10 E10.9, E11.9). 09/28/17   Arrien, Jimmy Picket, MD  dexamethasone (DECADRON) 4 MG tablet Take 10 tablets (40 mg total) by mouth every 7 (seven) days. 02/03/19   Curt Bears, MD  diphenhydrAMINE (BENADRYL ALLERGY) 25 MG tablet Take 1 tablet (25 mg total) by mouth every 6 (six) hours as needed for itching. 10/23/17   Tanner, Lyndon Code., PA-C  furosemide (LASIX) 20 MG tablet Take once daily for 3 days for lower extremity edema, then stop. Repeat as needed for recurrent edema. 10/15/17   Tanner, Lyndon Code., PA-C  gabapentin (NEURONTIN) 100 MG capsule Take 1 capsule (100 mg total) by mouth 3 (three) times daily. 12/23/18   Heilingoetter, Cassandra L, PA-C  metoprolol tartrate (LOPRESSOR) 25 MG tablet Take 0.5 tablets (12.5 mg total) by mouth 2 (two) times daily. 02/03/19 03/05/19  Curt Bears, MD  omeprazole (PRILOSEC) 40 MG capsule Take 1 capsule (40 mg total) by mouth daily. 02/03/19   Curt Bears, MD  oxyCODONE-acetaminophen (PERCOCET) 5-325 MG tablet Take 2 tablets by mouth every 6 (six) hours as needed. 02/17/19   Milton Ferguson, MD  polyethylene glycol (MIRALAX / Floria Raveling) packet Take 17 g by mouth daily. 10/15/17   Tanner, Lyndon Code., PA-C  prochlorperazine (COMPAZINE) 10 MG tablet Take 1 tablet (10 mg total) by mouth every 6 (six)  hours as needed for nausea or vomiting. 05/13/18   Heilingoetter, Cassandra L, PA-C  RELION GLUCOSE TEST STRIPS test strip USE 1 TO CHECK GLUCOSE ONCE DAILY 03/06/18   [provider]  ReliOn Ultra Thin Lancets 30G MISC USE 1 TO CHECK GLUCOSE ONCE DAILY FOR GLUCOSE TESTING 03/06/18   [provider]  traZODone (DESYREL) 50 MG tablet Take 1 tablet (50 mg total) by mouth at bedtime. 10/23/17   Harle Stanford., PA-C    Allergies    Patient has no known allergies.  Review of Systems   Review of Systems  Constitutional:  Negative for appetite change and fatigue.  HENT: Negative for congestion, ear discharge and sinus pressure.   Eyes: Negative for discharge.  Respiratory: Negative for cough.   Cardiovascular: Negative for chest pain.  Gastrointestinal: Negative for abdominal pain and diarrhea.  Genitourinary: Negative for frequency and hematuria.  Musculoskeletal: Negative for back pain.       Left hip pain  Skin: Negative for rash.  Neurological: Negative for seizures and headaches.  Psychiatric/Behavioral: Negative for hallucinations.    Physical Exam Updated Vital Signs BP (!) 146/77   Pulse 63   Temp 97.8 F (36.6 C) (Oral)   Resp 14   Ht _0  (1.727 m)   Wt 95.3 kg   SpO2 94%   BMI 31.93 kg/m   Physical Exam Vitals and nursing note reviewed.  Constitutional:      Appearance: She is well-developed.  HENT:     Head: Normocephalic.     Nose: Nose normal.  Eyes:     General: No scleral icterus.    Conjunctiva/sclera: Conjunctivae normal.  Neck:     Thyroid: No thyromegaly.  Cardiovascular:     Rate and Rhythm: Normal rate and regular rhythm.     Heart sounds: No murmur. No friction rub. No gallop.   Pulmonary:     Breath sounds: No stridor. No wheezing or rales.  Chest:     Chest wall: No tenderness.  Abdominal:     General: There is no distension.     Tenderness: There is no abdominal tenderness. There is no rebound.  Musculoskeletal:        General: Normal range of motion.     Cervical back: Neck supple.     Comments: Mild to moderate tenderness left hip  Lymphadenopathy:     Cervical: No cervical adenopathy.  Skin:    Findings: No erythema or rash.  Neurological:     Mental Status: She is alert and oriented to person, place, and time.     Motor: No abnormal muscle tone.     Coordination: Coordination normal.  Psychiatric:        Behavior: Behavior normal.     ED Results / Procedures / Treatments   Labs (all labs ordered are listed, but only abnormal results are  displayed) Labs Reviewed - No data to display  EKG None  Radiology DG HIP UNILAT W OR W/O PELVIS 2-3 VIEWS LEFT  Result Date: 02/17/2019 CLINICAL DATA:  Fall, left hip pain.  Multiple myeloma. EXAM: DG HIP (WITH OR WITHOUT PELVIS) 2-3V LEFT COMPARISON:  12/17/2017 FINDINGS: Cortical irregularity noted in the left superior pubic ramus compatible with nondisplaced fracture. No proximal femoral fracture. No subluxation or dislocation. IMPRESSION: Findings concerning for nondisplaced fracture in the left superior pubic ramus. Electronically Signed   By: Rolm Baptise M.D.   On: 02/17/2019 10:42    Procedures Procedures (including critical care time)  Medications Ordered in ED Medications  HYDROmorphone (DILAUDID) injection 1 mg (1 mg Intramuscular Given 02/17/19 1203)    ED Course  I have reviewed the triage vital signs and the nursing notes.  Pertinent labs & imaging results that were available during my care of the patient were reviewed by me and considered in my medical decision making (see chart for details).    MDM Rules/Calculators/A&P                      X-ray shows pubic rami fracture.  Patient given a walker and will follow-up with orthopedics she is also given pain medicine Final Clinical Impression(s) / ED Diagnoses Final diagnoses:  Fall, initial encounter    Rx / DC Orders ED Discharge Orders         Ordered    oxyCODONE-acetaminophen (PERCOCET) 5-325 MG tablet  Every 6 hours PRN     02/17/19 1350           Milton Ferguson, MD 02/17/19 1355

## 2019-02-17 NOTE — Patient Instructions (Signed)
[facility] Discharge Instructions for Patients Receiving Chemotherapy  Today you received the following chemotherapy agents: Bortezomib (Velcade)  To help prevent nausea and vomiting after your treatment, we encourage you to take your nausea medication as directed.   If you develop nausea and vomiting that is not controlled by your nausea medication, call the clinic.   BELOW ARE SYMPTOMS THAT SHOULD BE REPORTED IMMEDIATELY:  *FEVER GREATER THAN 100.5 F  *CHILLS WITH OR WITHOUT FEVER  NAUSEA AND VOMITING THAT IS NOT CONTROLLED WITH YOUR NAUSEA MEDICATION  *UNUSUAL SHORTNESS OF BREATH  *UNUSUAL BRUISING OR BLEEDING  TENDERNESS IN MOUTH AND THROAT WITH OR WITHOUT PRESENCE OF ULCERS  *URINARY PROBLEMS  *BOWEL PROBLEMS  UNUSUAL RASH Items with * indicate a potential emergency and should be followed up as soon as possible.  Feel free to call the clinic should you have any questions or concerns. The clinic phone number is [phone].  Please show the CHEMO ALERT CARD at check-in to the Emergency Department and triage nurse.  Coronavirus (COVID-19) Are you at risk?  Are you at risk for the Coronavirus (COVID-19)?  To be considered HIGH RISK for Coronavirus (COVID-19), you have to meet the following criteria:  . Traveled to [place], [place], [place], [place] or [place]; or in the [place] to [place], [place], [place], or [place]; and have fever, cough, and shortness of breath within the last 2 weeks of travel OR . Been in close contact with a person diagnosed with COVID-19 within the last 2 weeks and have fever, cough, and shortness of breath . IF YOU DO NOT MEET THESE CRITERIA, YOU ARE CONSIDERED LOW RISK FOR COVID-19.  What to do if you are HIGH RISK for COVID-19?  . If you are having a medical emergency, call 911. . Seek medical care right away. Before you go to a doctor's office, urgent care or emergency department, call ahead and tell them  about your recent travel, contact with someone diagnosed with COVID-19, and your symptoms. You should receive instructions from your physician's office regarding next steps of care.  . When you arrive at healthcare provider, tell the healthcare staff immediately you have returned from visiting [place], [place], [place], [place] or [place]; or traveled in the [place] to [place], [place], [place], or [place]; in the last two weeks or you have been in close contact with a person diagnosed with COVID-19 in the last 2 weeks.   . Tell the health care staff about your symptoms: fever, cough and shortness of breath. . After you have been seen by a medical provider, you will be either: o Tested for (COVID-19) and discharged home on quarantine except to seek medical care if symptoms worsen, and asked to  - Stay home and avoid contact with others until you get your results (4-5 days)  - Avoid travel on public transportation if possible (such as bus, train, or airplane) or o Sent to the Emergency Department by EMS for evaluation, COVID-19 testing, and possible admission depending on your condition and test results.  What to do if you are LOW RISK for COVID-19?  Reduce your risk of any infection by using the same precautions used for avoiding the common cold or flu:  . Wash your hands often with soap and warm water for at least 20 seconds.  If soap and water are not readily available, use an alcohol-based hand sanitizer with at least 60% alcohol.  . If coughing or   sneezing, cover your mouth and nose by coughing or sneezing into the elbow areas of your shirt or coat, into a tissue or into your sleeve (not your hands). . Avoid shaking hands with others and consider head nods or verbal greetings only. . Avoid touching your eyes, nose, or mouth with unwashed hands.  . Avoid close contact with people who are sick. . Avoid places or events with large numbers of people in one location, like concerts or  sporting events. . Carefully consider travel plans you have or are making. . If you are planning any travel outside or inside the [place], visit the CDC's Travelers' Health webpage for the latest health notices. . If you have some symptoms but not all symptoms, continue to monitor at home and seek medical attention if your symptoms worsen. . If you are having a medical emergency, call 911.   ADDITIONAL HEALTHCARE OPTIONS FOR PATIENTS  [place] [facility] / e-Visit: [email or web address]         [facility]: [phone]  [facility]: [phone]                   [facility]: [phone]   

## 2019-02-24 ENCOUNTER — Inpatient Hospital Stay: Payer: Self-pay

## 2019-02-24 ENCOUNTER — Other Ambulatory Visit: Payer: Self-pay

## 2019-02-24 VITALS — BP 134/64 | HR 78 | Temp 98.3°F | Resp 16 | Wt 211.5 lb

## 2019-02-24 DIAGNOSIS — C9000 Multiple myeloma not having achieved remission: Secondary | ICD-10-CM

## 2019-02-24 LAB — CBC WITH DIFFERENTIAL (CANCER CENTER ONLY)
Abs Immature Granulocytes: 0.03 10*3/uL (ref 0.00–0.07)
Basophils Absolute: 0 10*3/uL (ref 0.0–0.1)
Basophils Relative: 0 %
Eosinophils Absolute: 0.1 10*3/uL (ref 0.0–0.5)
Eosinophils Relative: 2 %
HCT: 35.3 % — ABNORMAL LOW (ref 36.0–46.0)
Hemoglobin: 11.9 g/dL — ABNORMAL LOW (ref 12.0–15.0)
Immature Granulocytes: 1 %
Lymphocytes Relative: 16 %
Lymphs Abs: 0.8 10*3/uL (ref 0.7–4.0)
MCH: 31.1 pg (ref 26.0–34.0)
MCHC: 33.7 g/dL (ref 30.0–36.0)
MCV: 92.2 fL (ref 80.0–100.0)
Monocytes Absolute: 0.5 10*3/uL (ref 0.1–1.0)
Monocytes Relative: 9 %
Neutro Abs: 3.5 10*3/uL (ref 1.7–7.7)
Neutrophils Relative %: 72 %
Platelet Count: 107 10*3/uL — ABNORMAL LOW (ref 150–400)
RBC: 3.83 MIL/uL — ABNORMAL LOW (ref 3.87–5.11)
RDW: 12.9 % (ref 11.5–15.5)
WBC Count: 4.9 10*3/uL (ref 4.0–10.5)
nRBC: 0 % (ref 0.0–0.2)

## 2019-02-24 LAB — CMP (CANCER CENTER ONLY)
ALT: 13 U/L (ref 0–44)
AST: 19 U/L (ref 15–41)
Albumin: 3.6 g/dL (ref 3.5–5.0)
Alkaline Phosphatase: 65 U/L (ref 38–126)
Anion gap: 12 (ref 5–15)
BUN: 17 mg/dL (ref 8–23)
CO2: 26 mmol/L (ref 22–32)
Calcium: 8.8 mg/dL — ABNORMAL LOW (ref 8.9–10.3)
Chloride: 101 mmol/L (ref 98–111)
Creatinine: 1.21 mg/dL — ABNORMAL HIGH (ref 0.44–1.00)
GFR, Est AFR Am: 53 mL/min — ABNORMAL LOW (ref >60)
GFR, Estimated: 46 mL/min — ABNORMAL LOW (ref >60)
Glucose, Bld: 76 mg/dL (ref 70–99)
Potassium: 4.3 mmol/L (ref 3.5–5.1)
Sodium: 139 mmol/L (ref 135–145)
Total Bilirubin: 0.5 mg/dL (ref 0.3–1.2)
Total Protein: 7.2 g/dL (ref 6.5–8.1)

## 2019-02-24 MED ORDER — PROCHLORPERAZINE MALEATE 10 MG PO TABS
ORAL_TABLET | ORAL | Status: AC
  Filled 2019-02-24: qty 1

## 2019-02-24 MED ORDER — BORTEZOMIB CHEMO SQ INJECTION 3.5 MG (2.5MG/ML)
1.3000 mg/m2 | Freq: Once | INTRAMUSCULAR | Status: AC
  Administered 2019-02-24: 2.75 mg via SUBCUTANEOUS
  Filled 2019-02-24: qty 1.1

## 2019-02-24 MED ORDER — PROCHLORPERAZINE MALEATE 10 MG PO TABS
10.0000 mg | ORAL_TABLET | Freq: Once | ORAL | Status: AC
  Administered 2019-02-24: 10 mg via ORAL

## 2019-02-24 NOTE — Patient Instructions (Signed)
[facility] Discharge Instructions for Patients Receiving Chemotherapy  Today you received the following chemotherapy agents Velcade.  To help prevent nausea and vomiting after your treatment, we encourage you to take your nausea medication as directed.  If you develop nausea and vomiting that is not controlled by your nausea medication, call the clinic.   BELOW ARE SYMPTOMS THAT SHOULD BE REPORTED IMMEDIATELY:  *FEVER GREATER THAN 100.5 F  *CHILLS WITH OR WITHOUT FEVER  NAUSEA AND VOMITING THAT IS NOT CONTROLLED WITH YOUR NAUSEA MEDICATION  *UNUSUAL SHORTNESS OF BREATH  *UNUSUAL BRUISING OR BLEEDING  TENDERNESS IN MOUTH AND THROAT WITH OR WITHOUT PRESENCE OF ULCERS  *URINARY PROBLEMS  *BOWEL PROBLEMS  UNUSUAL RASH Items with * indicate a potential emergency and should be followed up as soon as possible.  Feel free to call the clinic should you have any questions or concerns. The clinic phone number is [phone].  Please show the CHEMO ALERT CARD at check-in to the Emergency Department and triage nurse.   

## 2019-03-03 ENCOUNTER — Inpatient Hospital Stay: Payer: Self-pay

## 2019-03-03 ENCOUNTER — Other Ambulatory Visit: Payer: Self-pay | Admitting: *Deleted

## 2019-03-03 ENCOUNTER — Other Ambulatory Visit: Payer: Self-pay

## 2019-03-03 ENCOUNTER — Inpatient Hospital Stay: Payer: Self-pay | Attending: Internal Medicine

## 2019-03-03 VITALS — BP 159/68 | HR 70 | Temp 97.8°F | Resp 18 | Wt 207.2 lb

## 2019-03-03 DIAGNOSIS — Z5111 Encounter for antineoplastic chemotherapy: Secondary | ICD-10-CM | POA: Insufficient documentation

## 2019-03-03 DIAGNOSIS — C9000 Multiple myeloma not having achieved remission: Secondary | ICD-10-CM

## 2019-03-03 DIAGNOSIS — Z79899 Other long term (current) drug therapy: Secondary | ICD-10-CM | POA: Insufficient documentation

## 2019-03-03 DIAGNOSIS — K219 Gastro-esophageal reflux disease without esophagitis: Secondary | ICD-10-CM

## 2019-03-03 DIAGNOSIS — M7989 Other specified soft tissue disorders: Secondary | ICD-10-CM | POA: Insufficient documentation

## 2019-03-03 DIAGNOSIS — G629 Polyneuropathy, unspecified: Secondary | ICD-10-CM | POA: Insufficient documentation

## 2019-03-03 LAB — CBC WITH DIFFERENTIAL (CANCER CENTER ONLY)
Abs Immature Granulocytes: 0.03 10*3/uL (ref 0.00–0.07)
Basophils Absolute: 0 10*3/uL (ref 0.0–0.1)
Basophils Relative: 0 %
Eosinophils Absolute: 0.1 10*3/uL (ref 0.0–0.5)
Eosinophils Relative: 2 %
HCT: 36.6 % (ref 36.0–46.0)
Hemoglobin: 12.3 g/dL (ref 12.0–15.0)
Immature Granulocytes: 1 %
Lymphocytes Relative: 10 %
Lymphs Abs: 0.4 10*3/uL — ABNORMAL LOW (ref 0.7–4.0)
MCH: 31.1 pg (ref 26.0–34.0)
MCHC: 33.6 g/dL (ref 30.0–36.0)
MCV: 92.7 fL (ref 80.0–100.0)
Monocytes Absolute: 0.3 10*3/uL (ref 0.1–1.0)
Monocytes Relative: 7 %
Neutro Abs: 3.7 10*3/uL (ref 1.7–7.7)
Neutrophils Relative %: 80 %
Platelet Count: 147 10*3/uL — ABNORMAL LOW (ref 150–400)
RBC: 3.95 MIL/uL (ref 3.87–5.11)
RDW: 12.9 % (ref 11.5–15.5)
WBC Count: 4.5 10*3/uL (ref 4.0–10.5)
nRBC: 0 % (ref 0.0–0.2)

## 2019-03-03 LAB — CMP (CANCER CENTER ONLY)
ALT: 11 U/L (ref 0–44)
AST: 18 U/L (ref 15–41)
Albumin: 3.7 g/dL (ref 3.5–5.0)
Alkaline Phosphatase: 155 U/L — ABNORMAL HIGH (ref 38–126)
Anion gap: 10 (ref 5–15)
BUN: 15 mg/dL (ref 8–23)
CO2: 27 mmol/L (ref 22–32)
Calcium: 8.9 mg/dL (ref 8.9–10.3)
Chloride: 103 mmol/L (ref 98–111)
Creatinine: 1.3 mg/dL — ABNORMAL HIGH (ref 0.44–1.00)
GFR, Est AFR Am: 49 mL/min — ABNORMAL LOW (ref >60)
GFR, Estimated: 42 mL/min — ABNORMAL LOW (ref >60)
Glucose, Bld: 85 mg/dL (ref 70–99)
Potassium: 4.8 mmol/L (ref 3.5–5.1)
Sodium: 140 mmol/L (ref 135–145)
Total Bilirubin: 0.3 mg/dL (ref 0.3–1.2)
Total Protein: 7.6 g/dL (ref 6.5–8.1)

## 2019-03-03 MED ORDER — ACYCLOVIR 200 MG PO CAPS: ORAL_CAPSULE | ORAL | 2 refills | Status: DC

## 2019-03-03 MED ORDER — BORTEZOMIB CHEMO SQ INJECTION 3.5 MG (2.5MG/ML)
1.3000 mg/m2 | Freq: Once | INTRAMUSCULAR | Status: AC
  Administered 2019-03-03: 2.75 mg via SUBCUTANEOUS
  Filled 2019-03-03: qty 1.1

## 2019-03-03 MED ORDER — METOPROLOL TARTRATE 25 MG PO TABS: 12.5000 mg | ORAL_TABLET | Freq: Two times a day (BID) | ORAL | 0 refills | Status: DC

## 2019-03-03 MED ORDER — DEXAMETHASONE 4 MG PO TABS: 40.0000 mg | ORAL_TABLET | ORAL | 3 refills | Status: DC

## 2019-03-03 MED ORDER — PROCHLORPERAZINE MALEATE 10 MG PO TABS
10.0000 mg | ORAL_TABLET | Freq: Once | ORAL | Status: AC
  Administered 2019-03-03: 10 mg via ORAL

## 2019-03-03 MED ORDER — PROCHLORPERAZINE MALEATE 10 MG PO TABS
ORAL_TABLET | ORAL | Status: AC
  Filled 2019-03-03: qty 1

## 2019-03-03 MED ORDER — OMEPRAZOLE 40 MG PO CPDR: 40.0000 mg | DELAYED_RELEASE_CAPSULE | Freq: Every day | ORAL | 2 refills | Status: DC

## 2019-03-03 MED FILL — METOPROLOL TARTRATE 25 MG T: 25 | 30 days supply | Qty: 30 | Fill #0

## 2019-03-03 MED FILL — OMEPRAZOLE 40 MG CPDR: 40 | 30 days supply | Qty: 30 | Fill #0

## 2019-03-03 MED FILL — ACYCLOVIR 200 MG CAP: 200 | 30 days supply | Qty: 60 | Fill #0

## 2019-03-03 MED FILL — DEXAMETHASONE 4 MG TABLET: 4 | 28 days supply | Qty: 40 | Fill #0

## 2019-03-03 NOTE — Patient Instructions (Signed)
[facility] Discharge Instructions for Patients Receiving Chemotherapy  Today you received the following chemotherapy agents: Bortezomib (VELCADE).  To help prevent nausea and vomiting after your treatment, we encourage you to take your nausea medication as prescribed.   If you develop nausea and vomiting that is not controlled by your nausea medication, call the clinic.   BELOW ARE SYMPTOMS THAT SHOULD BE REPORTED IMMEDIATELY:  *FEVER GREATER THAN 100.5 F  *CHILLS WITH OR WITHOUT FEVER  NAUSEA AND VOMITING THAT IS NOT CONTROLLED WITH YOUR NAUSEA MEDICATION  *UNUSUAL SHORTNESS OF BREATH  *UNUSUAL BRUISING OR BLEEDING  TENDERNESS IN MOUTH AND THROAT WITH OR WITHOUT PRESENCE OF ULCERS  *URINARY PROBLEMS  *BOWEL PROBLEMS  UNUSUAL RASH Items with * indicate a potential emergency and should be followed up as soon as possible.  Feel free to call the clinic should you have any questions or concerns. The clinic phone number is [phone].  Please show the CHEMO ALERT CARD at check-in to the Emergency Department and triage nurse.   Coronavirus (COVID-19) Are you at risk?  Are you at risk for the Coronavirus (COVID-19)?  To be considered HIGH RISK for Coronavirus (COVID-19), you have to meet the following criteria:  . Traveled to [place], [place], [place], [place] or [place]; or in the [place] to [place], [place], [place], or [place]; and have fever, cough, and shortness of breath within the last 2 weeks of travel OR . Been in close contact with a person diagnosed with COVID-19 within the last 2 weeks and have fever, cough, and shortness of breath . IF YOU DO NOT MEET THESE CRITERIA, YOU ARE CONSIDERED LOW RISK FOR COVID-19.  What to do if you are HIGH RISK for COVID-19?  . If you are having a medical emergency, call 911. . Seek medical care right away. Before you go to a doctor's office, urgent care or emergency department, call ahead and tell them  about your recent travel, contact with someone diagnosed with COVID-19, and your symptoms. You should receive instructions from your physician's office regarding next steps of care.  . When you arrive at healthcare provider, tell the healthcare staff immediately you have returned from visiting [place], [place], [place], [place] or [place]; or traveled in the [place] to [place], [place], [place], or [place]; in the last two weeks or you have been in close contact with a person diagnosed with COVID-19 in the last 2 weeks.   . Tell the health care staff about your symptoms: fever, cough and shortness of breath. . After you have been seen by a medical provider, you will be either: o Tested for (COVID-19) and discharged home on quarantine except to seek medical care if symptoms worsen, and asked to  - Stay home and avoid contact with others until you get your results (4-5 days)  - Avoid travel on public transportation if possible (such as bus, train, or airplane) or o Sent to the Emergency Department by EMS for evaluation, COVID-19 testing, and possible admission depending on your condition and test results.  What to do if you are LOW RISK for COVID-19?  Reduce your risk of any infection by using the same precautions used for avoiding the common cold or flu:  . Wash your hands often with soap and warm water for at least 20 seconds.  If soap and water are not readily available, use an alcohol-based hand sanitizer with at least 60% alcohol.  . If coughing   or sneezing, cover your mouth and nose by coughing or sneezing into the elbow areas of your shirt or coat, into a tissue or into your sleeve (not your hands). . Avoid shaking hands with others and consider head nods or verbal greetings only. . Avoid touching your eyes, nose, or mouth with unwashed hands.  . Avoid close contact with people who are sick. . Avoid places or events with large numbers of people in one location, like concerts or  sporting events. . Carefully consider travel plans you have or are making. . If you are planning any travel outside or inside the [place], visit the CDC's Travelers' Health webpage for the latest health notices. . If you have some symptoms but not all symptoms, continue to monitor at home and seek medical attention if your symptoms worsen. . If you are having a medical emergency, call 911.   ADDITIONAL HEALTHCARE OPTIONS FOR PATIENTS  [place] [facility] / e-Visit: [email or web address]         [facility]: [phone]  [facility]: [phone]                   [facility]: [phone]   

## 2019-03-10 ENCOUNTER — Inpatient Hospital Stay: Payer: Self-pay

## 2019-03-10 ENCOUNTER — Other Ambulatory Visit: Payer: Self-pay

## 2019-03-10 VITALS — BP 113/80 | HR 76 | Temp 97.4°F | Resp 18 | Wt 209.2 lb

## 2019-03-10 DIAGNOSIS — C9000 Multiple myeloma not having achieved remission: Secondary | ICD-10-CM

## 2019-03-10 LAB — CMP (CANCER CENTER ONLY)
ALT: 11 U/L (ref 0–44)
AST: 17 U/L (ref 15–41)
Albumin: 3.7 g/dL (ref 3.5–5.0)
Alkaline Phosphatase: 243 U/L — ABNORMAL HIGH (ref 38–126)
Anion gap: 11 (ref 5–15)
BUN: 15 mg/dL (ref 8–23)
CO2: 25 mmol/L (ref 22–32)
Calcium: 8.7 mg/dL — ABNORMAL LOW (ref 8.9–10.3)
Chloride: 103 mmol/L (ref 98–111)
Creatinine: 1.35 mg/dL — ABNORMAL HIGH (ref 0.44–1.00)
GFR, Est AFR Am: 47 mL/min — ABNORMAL LOW (ref >60)
GFR, Estimated: 40 mL/min — ABNORMAL LOW (ref >60)
Glucose, Bld: 76 mg/dL (ref 70–99)
Potassium: 4.1 mmol/L (ref 3.5–5.1)
Sodium: 139 mmol/L (ref 135–145)
Total Bilirubin: 0.3 mg/dL (ref 0.3–1.2)
Total Protein: 7.4 g/dL (ref 6.5–8.1)

## 2019-03-10 LAB — CBC WITH DIFFERENTIAL (CANCER CENTER ONLY)
Abs Immature Granulocytes: 0.02 10*3/uL (ref 0.00–0.07)
Basophils Absolute: 0 10*3/uL (ref 0.0–0.1)
Basophils Relative: 0 %
Eosinophils Absolute: 0.1 10*3/uL (ref 0.0–0.5)
Eosinophils Relative: 2 %
HCT: 39.6 % (ref 36.0–46.0)
Hemoglobin: 13.2 g/dL (ref 12.0–15.0)
Immature Granulocytes: 0 %
Lymphocytes Relative: 19 %
Lymphs Abs: 0.9 10*3/uL (ref 0.7–4.0)
MCH: 30.8 pg (ref 26.0–34.0)
MCHC: 33.3 g/dL (ref 30.0–36.0)
MCV: 92.5 fL (ref 80.0–100.0)
Monocytes Absolute: 0.3 10*3/uL (ref 0.1–1.0)
Monocytes Relative: 7 %
Neutro Abs: 3.4 10*3/uL (ref 1.7–7.7)
Neutrophils Relative %: 72 %
Platelet Count: 118 10*3/uL — ABNORMAL LOW (ref 150–400)
RBC: 4.28 MIL/uL (ref 3.87–5.11)
RDW: 13 % (ref 11.5–15.5)
WBC Count: 4.8 10*3/uL (ref 4.0–10.5)
nRBC: 0 % (ref 0.0–0.2)

## 2019-03-10 MED ORDER — BORTEZOMIB CHEMO SQ INJECTION 3.5 MG (2.5MG/ML)
1.3000 mg/m2 | Freq: Once | INTRAMUSCULAR | Status: AC
  Administered 2019-03-10: 10:00:00 2.75 mg via SUBCUTANEOUS
  Filled 2019-03-10: qty 1.1

## 2019-03-10 MED ORDER — PROCHLORPERAZINE MALEATE 10 MG PO TABS
10.0000 mg | ORAL_TABLET | Freq: Once | ORAL | Status: AC
  Administered 2019-03-10: 09:00:00 10 mg via ORAL

## 2019-03-10 MED ORDER — PROCHLORPERAZINE MALEATE 10 MG PO TABS
ORAL_TABLET | ORAL | Status: AC
  Filled 2019-03-10: qty 1

## 2019-03-17 ENCOUNTER — Inpatient Hospital Stay: Payer: Self-pay

## 2019-03-17 ENCOUNTER — Inpatient Hospital Stay (HOSPITAL_BASED_OUTPATIENT_CLINIC_OR_DEPARTMENT_OTHER): Payer: Self-pay | Admitting: Internal Medicine

## 2019-03-17 ENCOUNTER — Other Ambulatory Visit: Payer: Self-pay

## 2019-03-17 ENCOUNTER — Encounter: Payer: Self-pay | Admitting: Internal Medicine

## 2019-03-17 VITALS — BP 145/76 | HR 78 | Temp 98.2°F | Resp 17 | Ht 68.0 in | Wt 210.1 lb

## 2019-03-17 DIAGNOSIS — C9000 Multiple myeloma not having achieved remission: Secondary | ICD-10-CM

## 2019-03-17 DIAGNOSIS — M899 Disorder of bone, unspecified: Secondary | ICD-10-CM

## 2019-03-17 DIAGNOSIS — Z5111 Encounter for antineoplastic chemotherapy: Secondary | ICD-10-CM

## 2019-03-17 LAB — CMP (CANCER CENTER ONLY)
ALT: 12 U/L (ref 0–44)
AST: 16 U/L (ref 15–41)
Albumin: 3.6 g/dL (ref 3.5–5.0)
Alkaline Phosphatase: 226 U/L — ABNORMAL HIGH (ref 38–126)
Anion gap: 11 (ref 5–15)
BUN: 19 mg/dL (ref 8–23)
CO2: 27 mmol/L (ref 22–32)
Calcium: 8.6 mg/dL — ABNORMAL LOW (ref 8.9–10.3)
Chloride: 103 mmol/L (ref 98–111)
Creatinine: 1.37 mg/dL — ABNORMAL HIGH (ref 0.44–1.00)
GFR, Est AFR Am: 46 mL/min — ABNORMAL LOW (ref >60)
GFR, Estimated: 40 mL/min — ABNORMAL LOW (ref >60)
Glucose, Bld: 87 mg/dL (ref 70–99)
Potassium: 4.1 mmol/L (ref 3.5–5.1)
Sodium: 141 mmol/L (ref 135–145)
Total Bilirubin: 0.4 mg/dL (ref 0.3–1.2)
Total Protein: 7.2 g/dL (ref 6.5–8.1)

## 2019-03-17 LAB — CBC WITH DIFFERENTIAL (CANCER CENTER ONLY)
Abs Immature Granulocytes: 0.01 10*3/uL (ref 0.00–0.07)
Basophils Absolute: 0 10*3/uL (ref 0.0–0.1)
Basophils Relative: 0 %
Eosinophils Absolute: 0.1 10*3/uL (ref 0.0–0.5)
Eosinophils Relative: 2 %
HCT: 39.4 % (ref 36.0–46.0)
Hemoglobin: 13.1 g/dL (ref 12.0–15.0)
Immature Granulocytes: 0 %
Lymphocytes Relative: 25 %
Lymphs Abs: 1 10*3/uL (ref 0.7–4.0)
MCH: 30.7 pg (ref 26.0–34.0)
MCHC: 33.2 g/dL (ref 30.0–36.0)
MCV: 92.3 fL (ref 80.0–100.0)
Monocytes Absolute: 0.4 10*3/uL (ref 0.1–1.0)
Monocytes Relative: 9 %
Neutro Abs: 2.6 10*3/uL (ref 1.7–7.7)
Neutrophils Relative %: 64 %
Platelet Count: 99 10*3/uL — ABNORMAL LOW (ref 150–400)
RBC: 4.27 MIL/uL (ref 3.87–5.11)
RDW: 13.3 % (ref 11.5–15.5)
WBC Count: 4.1 10*3/uL (ref 4.0–10.5)
nRBC: 0 % (ref 0.0–0.2)

## 2019-03-17 MED ORDER — PROCHLORPERAZINE MALEATE 10 MG PO TABS
10.0000 mg | ORAL_TABLET | Freq: Once | ORAL | Status: AC
  Administered 2019-03-17: 09:00:00 10 mg via ORAL

## 2019-03-17 MED ORDER — BORTEZOMIB CHEMO SQ INJECTION 3.5 MG (2.5MG/ML)
1.3000 mg/m2 | Freq: Once | INTRAMUSCULAR | Status: AC
  Administered 2019-03-17: 10:00:00 2.75 mg via SUBCUTANEOUS
  Filled 2019-03-17: qty 1.1

## 2019-03-17 MED ORDER — PROCHLORPERAZINE MALEATE 10 MG PO TABS
ORAL_TABLET | ORAL | Status: AC
  Filled 2019-03-17: qty 1

## 2019-03-17 NOTE — Progress Notes (Signed)
Per Dr. Julien Nordmann , it is okay to treat pt today with Velcade and plts of 99k.

## 2019-03-17 NOTE — Progress Notes (Signed)
Pt stated she took Dexamethasone 40 mg at home this am prior to coming in for appts today.

## 2019-03-17 NOTE — Patient Instructions (Signed)
[facility] Discharge Instructions for Patients Receiving Chemotherapy  Today you received the following chemotherapy agents Velcade To help prevent nausea and vomiting after your treatment, we encourage you to take your nausea medication as prescribed.   If you develop nausea and vomiting that is not controlled by your nausea medication, call the clinic.   BELOW ARE SYMPTOMS THAT SHOULD BE REPORTED IMMEDIATELY:  *FEVER GREATER THAN 100.5 F  *CHILLS WITH OR WITHOUT FEVER  NAUSEA AND VOMITING THAT IS NOT CONTROLLED WITH YOUR NAUSEA MEDICATION  *UNUSUAL SHORTNESS OF BREATH  *UNUSUAL BRUISING OR BLEEDING  TENDERNESS IN MOUTH AND THROAT WITH OR WITHOUT PRESENCE OF ULCERS  *URINARY PROBLEMS  *BOWEL PROBLEMS  UNUSUAL RASH Items with * indicate a potential emergency and should be followed up as soon as possible.  Feel free to call the clinic should you have any questions or concerns. The clinic phone number is [phone].  Please show the CHEMO ALERT CARD at check-in to the Emergency Department and triage nurse.   

## 2019-03-17 NOTE — Progress Notes (Signed)
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  PRIOR THERAPY: None  CURRENT THERAPY: Systemic therapy with Velcade 1.3 mg/M2 weekly, Revlimid 25 mg p.o. daily for 14 days every 3 weeks in addition to weekly Decadron 40 mg orally. First dose of treatment 10/08/2017.Treatment was placed on hold due to development of a significant rash.Sheresumedtreatment with dexamethasone and Velcade on 11/19/2017. She will not resume treatment of Revlimid due to the rash.  Status post 23 months of treatment.  INTERVAL HISTORY: Debbie Chan 69 y.o. female returns to the clinic today for follow-up visit.  The patient is feeling fine today with no concerning complaints except for the weight gain from the treatment with steroids.  She also had mild swelling and numbness in the lower extremities.  She denied having any current chest pain, shortness of breath, cough or hemoptysis.  She denied having any fever or chills.  She has no nausea, vomiting, diarrhea or constipation.  She has no headache or visual changes.  She continues to tolerate her treatment with subcutaneous Velcade and Decadron fairly well.  She is here today for evaluation before starting day 15 of cycle 24.  MEDICAL HISTORY: Past Medical History:  Diagnosis Date  . Cancer ([place])     ALLERGIES:  has No Known Allergies.  MEDICATIONS:  Current Outpatient Medications  Medication Sig Dispense Refill  . acyclovir (ZOVIRAX) 200 MG capsule TAKE 1 CAPSULE BY MOUTH 2 TIMES DAILY. 60 capsule 2  . blood glucose meter kit and supplies Dispense based on patient and insurance preference. Use up to four times daily as directed. (FOR ICD-10 E10.9, E11.9). 1 each 0  . dexamethasone (DECADRON) 4 MG tablet Take 10 tablets (40 mg total) by mouth every 7 (seven) days. 40 tablet 3   . diphenhydrAMINE (BENADRYL ALLERGY) 25 MG tablet Take 1 tablet (25 mg total) by mouth every 6 (six) hours as needed for itching. 100 tablet 0  . furosemide (LASIX) 20 MG tablet Take once daily for 3 days for lower extremity edema, then stop. Repeat as needed for recurrent edema. 30 tablet 2  . gabapentin (NEURONTIN) 100 MG capsule Take 1 capsule (100 mg total) by mouth 3 (three) times daily. 90 capsule 2  . metoprolol tartrate (LOPRESSOR) 25 MG tablet Take 0.5 tablets (12.5 mg total) by mouth 2 (two) times daily. 30 tablet 0  . MOBIC 15 MG tablet Take 15 mg by mouth daily.    Marland Kitchen omeprazole (PRILOSEC) 40 MG capsule Take 1 capsule (40 mg total) by mouth daily. 30 capsule 2  . oxyCODONE-acetaminophen (PERCOCET) 5-325 MG tablet Take 2 tablets by mouth every 6 (six) hours as needed. 20 tablet 0  . polyethylene glycol (MIRALAX / GLYCOLAX) packet Take 17 g by mouth daily. 14 each 0  . prochlorperazine (COMPAZINE) 10 MG tablet Take 1 tablet (10 mg total) by mouth every 6 (six) hours as needed for nausea or vomiting. 30 tablet 0  . RELION GLUCOSE TEST STRIPS test strip USE 1 TO CHECK GLUCOSE ONCE DAILY    . ReliOn Ultra Thin Lancets 30G MISC USE 1 TO CHECK GLUCOSE ONCE DAILY FOR GLUCOSE TESTING    . traMADol (ULTRAM) 50 MG tablet Take 50 mg by mouth every 8 (eight) hours as needed.    . traZODone (DESYREL) 50 MG tablet Take 1 tablet (50 mg  total) by mouth at bedtime. 30 tablet 0   No current facility-administered medications for this visit.    SURGICAL HISTORY: History reviewed. No pertinent surgical history.  REVIEW OF SYSTEMS:  A comprehensive review of systems was negative except for: Constitutional: positive for fatigue Neurological: positive for paresthesia   PHYSICAL EXAMINATION: General appearance: alert, cooperative, fatigued and no distress Head: Normocephalic, without obvious abnormality, atraumatic Neck: no adenopathy, no JVD, supple, symmetrical, trachea midline and thyroid not enlarged,  symmetric, no tenderness/mass/nodules Lymph nodes: Cervical, supraclavicular, and axillary nodes normal. Resp: clear to auscultation bilaterally Back: symmetric, no curvature. ROM normal. No CVA tenderness. Cardio: regular rate and rhythm, S1, S2 normal, no murmur, click, rub or gallop GI: soft, non-tender; bowel sounds normal; no masses,  no organomegaly Extremities: extremities normal, atraumatic, no cyanosis or edema  ECOG PERFORMANCE STATUS: 1 - Symptomatic but completely ambulatory  Blood pressure (!) 145/76, pulse 78, temperature 98.2 F (36.8 C), temperature source Temporal, resp. rate 17, height '5\' 8"'$  (1.727 m), weight 210 lb 1.6 oz (95.3 kg), SpO2 100 %.  LABORATORY DATA: Lab Results  Component Value Date   WBC 4.1 03/17/2019   HGB 13.1 03/17/2019   HCT 39.4 03/17/2019   MCV 92.3 03/17/2019   PLT 99 (L) 03/17/2019      Chemistry      Component Value Date/Time   NA 139 03/10/2019 0752   K 4.1 03/10/2019 0752   CL 103 03/10/2019 0752   CO2 25 03/10/2019 0752   BUN 15 03/10/2019 0752   CREATININE 1.35 (H) 03/10/2019 0752      Component Value Date/Time   CALCIUM 8.7 (L) 03/10/2019 0752   ALKPHOS 243 (H) 03/10/2019 0752   AST 17 03/10/2019 0752   ALT 11 03/10/2019 0752   BILITOT 0.3 03/10/2019 0752       RADIOGRAPHIC STUDIES: DG HIP UNILAT W OR W/O PELVIS 2-3 VIEWS LEFT  Result Date: 02/17/2019 CLINICAL DATA:  Fall, left hip pain.  Multiple myeloma. EXAM: DG HIP (WITH OR WITHOUT PELVIS) 2-3V LEFT COMPARISON:  12/17/2017 FINDINGS: Cortical irregularity noted in the left superior pubic ramus compatible with nondisplaced fracture. No proximal femoral fracture. No subluxation or dislocation. IMPRESSION: Findings concerning for nondisplaced fracture in the left superior pubic ramus. Electronically Signed   By: Rolm Baptise M.D.   On: 02/17/2019 10:42    ASSESSMENT AND PLAN: This is a very pleasant 69 years old African female originally from [place] who is visiting her  son in [place] and was recently diagnosed with IgA multiple myeloma. The patient was a started initially on treatment with weekly subcutaneous Velcade 1.3 mg/M2, Revlimid 25 mg p.o. daily for 21 days every 4 weeks as well as weekly Decadron 40 mg orally.  Revlimid was discontinued secondary to hypersensitivity reaction with significant skin rash.  She is currently on treatment with weekly Velcade and Decadron. The patient continues to tolerate her treatment fairly well with no concerning adverse effects. I recommended for her to proceed with her treatment today as planned. I will see her back for follow-up visit in 4 weeks for evaluation with repeat myeloma panel for evaluation of her disease. For the peripheral neuropathy, she will continue her current treatment with gabapentin. The patient was advised to call immediately if she has any concerning symptoms in the interval. The patient voices understanding of current disease status and treatment options and is in agreement with the current care plan. All questions were answered. The patient knows to call the  clinic with any problems, questions or concerns. We can certainly see the patient much sooner if necessary.  Disclaimer: This note was dictated with voice recognition software. Similar sounding words can inadvertently be transcribed and may not be corrected upon review.

## 2019-03-23 ENCOUNTER — Other Ambulatory Visit: Payer: Self-pay | Admitting: Medical Oncology

## 2019-03-23 DIAGNOSIS — C9000 Multiple myeloma not having achieved remission: Secondary | ICD-10-CM

## 2019-03-24 ENCOUNTER — Inpatient Hospital Stay: Payer: Self-pay

## 2019-03-24 ENCOUNTER — Other Ambulatory Visit: Payer: Self-pay

## 2019-03-24 VITALS — BP 153/77 | HR 66 | Temp 98.5°F | Resp 20

## 2019-03-24 DIAGNOSIS — C9000 Multiple myeloma not having achieved remission: Secondary | ICD-10-CM

## 2019-03-24 LAB — CMP (CANCER CENTER ONLY)
ALT: 10 U/L (ref 0–44)
AST: 14 U/L — ABNORMAL LOW (ref 15–41)
Albumin: 3.7 g/dL (ref 3.5–5.0)
Alkaline Phosphatase: 194 U/L — ABNORMAL HIGH (ref 38–126)
Anion gap: 11 (ref 5–15)
BUN: 15 mg/dL (ref 8–23)
CO2: 27 mmol/L (ref 22–32)
Calcium: 8.8 mg/dL — ABNORMAL LOW (ref 8.9–10.3)
Chloride: 103 mmol/L (ref 98–111)
Creatinine: 1.34 mg/dL — ABNORMAL HIGH (ref 0.44–1.00)
GFR, Est AFR Am: 47 mL/min — ABNORMAL LOW (ref >60)
GFR, Estimated: 41 mL/min — ABNORMAL LOW (ref >60)
Glucose, Bld: 95 mg/dL (ref 70–99)
Potassium: 4.7 mmol/L (ref 3.5–5.1)
Sodium: 141 mmol/L (ref 135–145)
Total Bilirubin: 0.4 mg/dL (ref 0.3–1.2)
Total Protein: 7.3 g/dL (ref 6.5–8.1)

## 2019-03-24 LAB — CBC WITH DIFFERENTIAL (CANCER CENTER ONLY)
Abs Immature Granulocytes: 0.02 10*3/uL (ref 0.00–0.07)
Basophils Absolute: 0 10*3/uL (ref 0.0–0.1)
Basophils Relative: 0 %
Eosinophils Absolute: 0.1 10*3/uL (ref 0.0–0.5)
Eosinophils Relative: 3 %
HCT: 38.5 % (ref 36.0–46.0)
Hemoglobin: 13 g/dL (ref 12.0–15.0)
Immature Granulocytes: 0 %
Lymphocytes Relative: 26 %
Lymphs Abs: 1.3 10*3/uL (ref 0.7–4.0)
MCH: 31 pg (ref 26.0–34.0)
MCHC: 33.8 g/dL (ref 30.0–36.0)
MCV: 91.9 fL (ref 80.0–100.0)
Monocytes Absolute: 0.5 10*3/uL (ref 0.1–1.0)
Monocytes Relative: 11 %
Neutro Abs: 3.1 10*3/uL (ref 1.7–7.7)
Neutrophils Relative %: 60 %
Platelet Count: 99 10*3/uL — ABNORMAL LOW (ref 150–400)
RBC: 4.19 MIL/uL (ref 3.87–5.11)
RDW: 13.2 % (ref 11.5–15.5)
WBC Count: 5.1 10*3/uL (ref 4.0–10.5)
nRBC: 0 % (ref 0.0–0.2)

## 2019-03-24 MED ORDER — PROCHLORPERAZINE MALEATE 10 MG PO TABS
10.0000 mg | ORAL_TABLET | Freq: Once | ORAL | Status: AC
  Administered 2019-03-24: 09:00:00 10 mg via ORAL

## 2019-03-24 MED ORDER — PROCHLORPERAZINE MALEATE 10 MG PO TABS
ORAL_TABLET | ORAL | Status: AC
  Filled 2019-03-24: qty 1

## 2019-03-24 MED ORDER — BORTEZOMIB CHEMO SQ INJECTION 3.5 MG (2.5MG/ML)
1.3000 mg/m2 | Freq: Once | INTRAMUSCULAR | Status: AC
  Administered 2019-03-24: 10:00:00 2.75 mg via SUBCUTANEOUS
  Filled 2019-03-24: qty 1.1

## 2019-03-24 NOTE — Patient Instructions (Signed)
[facility] Discharge Instructions for Patients Receiving Chemotherapy  Today you received the following chemotherapy agents Velcade To help prevent nausea and vomiting after your treatment, we encourage you to take your nausea medication as prescribed.   If you develop nausea and vomiting that is not controlled by your nausea medication, call the clinic.   BELOW ARE SYMPTOMS THAT SHOULD BE REPORTED IMMEDIATELY:  *FEVER GREATER THAN 100.5 F  *CHILLS WITH OR WITHOUT FEVER  NAUSEA AND VOMITING THAT IS NOT CONTROLLED WITH YOUR NAUSEA MEDICATION  *UNUSUAL SHORTNESS OF BREATH  *UNUSUAL BRUISING OR BLEEDING  TENDERNESS IN MOUTH AND THROAT WITH OR WITHOUT PRESENCE OF ULCERS  *URINARY PROBLEMS  *BOWEL PROBLEMS  UNUSUAL RASH Items with * indicate a potential emergency and should be followed up as soon as possible.  Feel free to call the clinic should you have any questions or concerns. The clinic phone number is [phone].  Please show the CHEMO ALERT CARD at check-in to the Emergency Department and triage nurse.   

## 2019-03-24 NOTE — Progress Notes (Signed)
Okay to treat today with plts 99, per Dr. Mckinley Jewel.

## 2019-03-30 ENCOUNTER — Other Ambulatory Visit: Payer: Self-pay | Admitting: *Deleted

## 2019-03-30 DIAGNOSIS — C9000 Multiple myeloma not having achieved remission: Secondary | ICD-10-CM

## 2019-03-31 ENCOUNTER — Other Ambulatory Visit: Payer: Self-pay | Admitting: Internal Medicine

## 2019-03-31 ENCOUNTER — Other Ambulatory Visit: Payer: Self-pay

## 2019-03-31 ENCOUNTER — Other Ambulatory Visit: Payer: Self-pay | Admitting: *Deleted

## 2019-03-31 ENCOUNTER — Other Ambulatory Visit: Payer: Self-pay | Admitting: Medical Oncology

## 2019-03-31 ENCOUNTER — Inpatient Hospital Stay: Payer: Self-pay | Attending: Internal Medicine

## 2019-03-31 ENCOUNTER — Inpatient Hospital Stay: Payer: Self-pay

## 2019-03-31 VITALS — BP 162/78 | HR 75 | Temp 97.4°F | Resp 20 | Wt 207.2 lb

## 2019-03-31 DIAGNOSIS — C9000 Multiple myeloma not having achieved remission: Secondary | ICD-10-CM

## 2019-03-31 DIAGNOSIS — Z79899 Other long term (current) drug therapy: Secondary | ICD-10-CM | POA: Insufficient documentation

## 2019-03-31 DIAGNOSIS — K219 Gastro-esophageal reflux disease without esophagitis: Secondary | ICD-10-CM

## 2019-03-31 DIAGNOSIS — Z5111 Encounter for antineoplastic chemotherapy: Secondary | ICD-10-CM | POA: Insufficient documentation

## 2019-03-31 LAB — CBC WITH DIFFERENTIAL (CANCER CENTER ONLY)
Abs Immature Granulocytes: 0.02 10*3/uL (ref 0.00–0.07)
Basophils Absolute: 0 10*3/uL (ref 0.0–0.1)
Basophils Relative: 0 %
Eosinophils Absolute: 0 10*3/uL (ref 0.0–0.5)
Eosinophils Relative: 0 %
HCT: 40.2 % (ref 36.0–46.0)
Hemoglobin: 13.5 g/dL (ref 12.0–15.0)
Immature Granulocytes: 0 %
Lymphocytes Relative: 10 %
Lymphs Abs: 0.5 10*3/uL — ABNORMAL LOW (ref 0.7–4.0)
MCH: 30.9 pg (ref 26.0–34.0)
MCHC: 33.6 g/dL (ref 30.0–36.0)
MCV: 92 fL (ref 80.0–100.0)
Monocytes Absolute: 0.1 10*3/uL (ref 0.1–1.0)
Monocytes Relative: 2 %
Neutro Abs: 4.8 10*3/uL (ref 1.7–7.7)
Neutrophils Relative %: 88 %
Platelet Count: 111 10*3/uL — ABNORMAL LOW (ref 150–400)
RBC: 4.37 MIL/uL (ref 3.87–5.11)
RDW: 13.2 % (ref 11.5–15.5)
WBC Count: 5.5 10*3/uL (ref 4.0–10.5)
nRBC: 0 % (ref 0.0–0.2)

## 2019-03-31 LAB — CMP (CANCER CENTER ONLY)
ALT: 12 U/L (ref 0–44)
AST: 15 U/L (ref 15–41)
Albumin: 3.9 g/dL (ref 3.5–5.0)
Alkaline Phosphatase: 165 U/L — ABNORMAL HIGH (ref 38–126)
Anion gap: 13 (ref 5–15)
BUN: 17 mg/dL (ref 8–23)
CO2: 25 mmol/L (ref 22–32)
Calcium: 9.6 mg/dL (ref 8.9–10.3)
Chloride: 107 mmol/L (ref 98–111)
Creatinine: 1.28 mg/dL — ABNORMAL HIGH (ref 0.44–1.00)
GFR, Est AFR Am: 50 mL/min — ABNORMAL LOW (ref >60)
GFR, Estimated: 43 mL/min — ABNORMAL LOW (ref >60)
Glucose, Bld: 151 mg/dL — ABNORMAL HIGH (ref 70–99)
Potassium: 5.4 mmol/L — ABNORMAL HIGH (ref 3.5–5.1)
Sodium: 145 mmol/L (ref 135–145)
Total Bilirubin: 0.3 mg/dL (ref 0.3–1.2)
Total Protein: 8 g/dL (ref 6.5–8.1)

## 2019-03-31 MED ORDER — METOPROLOL TARTRATE 25 MG PO TABS: 12.5000 mg | ORAL_TABLET | Freq: Two times a day (BID) | ORAL | 3 refills | Status: DC

## 2019-03-31 MED ORDER — PROCHLORPERAZINE MALEATE 10 MG PO TABS
10.0000 mg | ORAL_TABLET | Freq: Once | ORAL | Status: AC
  Administered 2019-03-31: 10 mg via ORAL

## 2019-03-31 MED ORDER — PROCHLORPERAZINE MALEATE 10 MG PO TABS
ORAL_TABLET | ORAL | Status: AC
  Filled 2019-03-31: qty 1

## 2019-03-31 MED ORDER — BORTEZOMIB CHEMO SQ INJECTION 3.5 MG (2.5MG/ML)
1.3000 mg/m2 | Freq: Once | INTRAMUSCULAR | Status: AC
  Administered 2019-03-31: 2.75 mg via SUBCUTANEOUS
  Filled 2019-03-31: qty 1.1

## 2019-03-31 MED ORDER — OMEPRAZOLE 40 MG PO CPDR: 40.0000 mg | DELAYED_RELEASE_CAPSULE | Freq: Every day | ORAL | 2 refills | Status: DC

## 2019-03-31 MED FILL — METOPROLOL TARTRATE 25 MG T: 25 | 30 days supply | Qty: 30 | Fill #0

## 2019-03-31 MED FILL — OMEPRAZOLE 40 MG CPDR: 40 | 30 days supply | Qty: 30 | Fill #0

## 2019-03-31 NOTE — Patient Instructions (Signed)
[facility] Discharge Instructions for Patients Receiving Chemotherapy  Today you received the following chemotherapy agents Velcade To help prevent nausea and vomiting after your treatment, we encourage you to take your nausea medication as prescribed.   If you develop nausea and vomiting that is not controlled by your nausea medication, call the clinic.   BELOW ARE SYMPTOMS THAT SHOULD BE REPORTED IMMEDIATELY:  *FEVER GREATER THAN 100.5 F  *CHILLS WITH OR WITHOUT FEVER  NAUSEA AND VOMITING THAT IS NOT CONTROLLED WITH YOUR NAUSEA MEDICATION  *UNUSUAL SHORTNESS OF BREATH  *UNUSUAL BRUISING OR BLEEDING  TENDERNESS IN MOUTH AND THROAT WITH OR WITHOUT PRESENCE OF ULCERS  *URINARY PROBLEMS  *BOWEL PROBLEMS  UNUSUAL RASH Items with * indicate a potential emergency and should be followed up as soon as possible.  Feel free to call the clinic should you have any questions or concerns. The clinic phone number is [phone].  Please show the CHEMO ALERT CARD at check-in to the Emergency Department and triage nurse.   

## 2019-04-01 ENCOUNTER — Telehealth: Payer: Self-pay | Admitting: Medical Oncology

## 2019-04-01 MED FILL — DEXAMETHASONE 4 MG TABLET: 4 | 28 days supply | Qty: 40 | Fill #1

## 2019-04-01 MED FILL — ACYCLOVIR 200 MG CAP: 200 | 30 days supply | Qty: 60 | Fill #1

## 2019-04-01 NOTE — Telephone Encounter (Signed)
Son said he could not get decadron or acylclovir refills yesterday. I called Pharmacy and was told the pt needs to request a refill . They confirmed that they have the 2 refills for acyclovir, decadron ,omeperazole and 3 for metoprolol. VM left for son to know the names of  meds pt is taking so he can ask for refills for his mother. Per Pharmacy ,  He did not ask for acyclovir or decadron yesaterday.  I instructed him the refills were requested by me today .

## 2019-04-05 ENCOUNTER — Telehealth: Payer: Self-pay | Admitting: Medical Oncology

## 2019-04-05 NOTE — Telephone Encounter (Signed)
Sneezing and coughing. Denies fever. Marland Kitchen"Anytime she starts coughing like this is when I  know she is getting sick".   I instructed son to get pt tested for COVID and to take OTC cough and cold medication.  Appt Thursday.

## 2019-04-07 ENCOUNTER — Inpatient Hospital Stay: Payer: Self-pay

## 2019-04-07 ENCOUNTER — Other Ambulatory Visit: Payer: Self-pay

## 2019-04-07 VITALS — BP 149/83 | HR 75 | Temp 97.6°F | Resp 18 | Wt 207.5 lb

## 2019-04-07 DIAGNOSIS — C9000 Multiple myeloma not having achieved remission: Secondary | ICD-10-CM

## 2019-04-07 LAB — CBC WITH DIFFERENTIAL (CANCER CENTER ONLY)
Abs Immature Granulocytes: 0.02 10*3/uL (ref 0.00–0.07)
Basophils Absolute: 0 10*3/uL (ref 0.0–0.1)
Basophils Relative: 0 %
Eosinophils Absolute: 0.2 10*3/uL (ref 0.0–0.5)
Eosinophils Relative: 4 %
HCT: 40.7 % (ref 36.0–46.0)
Hemoglobin: 13.6 g/dL (ref 12.0–15.0)
Immature Granulocytes: 0 %
Lymphocytes Relative: 17 %
Lymphs Abs: 0.9 10*3/uL (ref 0.7–4.0)
MCH: 30.1 pg (ref 26.0–34.0)
MCHC: 33.4 g/dL (ref 30.0–36.0)
MCV: 90 fL (ref 80.0–100.0)
Monocytes Absolute: 0.3 10*3/uL (ref 0.1–1.0)
Monocytes Relative: 5 %
Neutro Abs: 3.8 10*3/uL (ref 1.7–7.7)
Neutrophils Relative %: 74 %
Platelet Count: 109 10*3/uL — ABNORMAL LOW (ref 150–400)
RBC: 4.52 MIL/uL (ref 3.87–5.11)
RDW: 13.1 % (ref 11.5–15.5)
WBC Count: 5.1 10*3/uL (ref 4.0–10.5)
nRBC: 0 % (ref 0.0–0.2)

## 2019-04-07 LAB — CMP (CANCER CENTER ONLY)
ALT: 11 U/L (ref 0–44)
AST: 15 U/L (ref 15–41)
Albumin: 3.8 g/dL (ref 3.5–5.0)
Alkaline Phosphatase: 148 U/L — ABNORMAL HIGH (ref 38–126)
Anion gap: 10 (ref 5–15)
BUN: 16 mg/dL (ref 8–23)
CO2: 28 mmol/L (ref 22–32)
Calcium: 9.3 mg/dL (ref 8.9–10.3)
Chloride: 103 mmol/L (ref 98–111)
Creatinine: 1.26 mg/dL — ABNORMAL HIGH (ref 0.44–1.00)
GFR, Est AFR Am: 51 mL/min — ABNORMAL LOW (ref >60)
GFR, Estimated: 44 mL/min — ABNORMAL LOW (ref >60)
Glucose, Bld: 93 mg/dL (ref 70–99)
Potassium: 4.2 mmol/L (ref 3.5–5.1)
Sodium: 141 mmol/L (ref 135–145)
Total Bilirubin: 0.3 mg/dL (ref 0.3–1.2)
Total Protein: 8 g/dL (ref 6.5–8.1)

## 2019-04-07 LAB — LACTATE DEHYDROGENASE: LDH: 205 U/L — ABNORMAL HIGH (ref 98–192)

## 2019-04-07 MED ORDER — PROCHLORPERAZINE MALEATE 10 MG PO TABS
ORAL_TABLET | ORAL | Status: AC
  Filled 2019-04-07: qty 1

## 2019-04-07 MED ORDER — PROCHLORPERAZINE MALEATE 10 MG PO TABS
10.0000 mg | ORAL_TABLET | Freq: Once | ORAL | Status: AC
  Administered 2019-04-07: 10 mg via ORAL

## 2019-04-07 MED ORDER — BORTEZOMIB CHEMO SQ INJECTION 3.5 MG (2.5MG/ML)
1.3000 mg/m2 | Freq: Once | INTRAMUSCULAR | Status: AC
  Administered 2019-04-07: 2.75 mg via SUBCUTANEOUS
  Filled 2019-04-07: qty 1.1

## 2019-04-07 NOTE — Patient Instructions (Signed)
[facility] Discharge Instructions for Patients Receiving Chemotherapy  Today you received the following chemotherapy agents Velcade To help prevent nausea and vomiting after your treatment, we encourage you to take your nausea medication as prescribed.   If you develop nausea and vomiting that is not controlled by your nausea medication, call the clinic.   BELOW ARE SYMPTOMS THAT SHOULD BE REPORTED IMMEDIATELY:  *FEVER GREATER THAN 100.5 F  *CHILLS WITH OR WITHOUT FEVER  NAUSEA AND VOMITING THAT IS NOT CONTROLLED WITH YOUR NAUSEA MEDICATION  *UNUSUAL SHORTNESS OF BREATH  *UNUSUAL BRUISING OR BLEEDING  TENDERNESS IN MOUTH AND THROAT WITH OR WITHOUT PRESENCE OF ULCERS  *URINARY PROBLEMS  *BOWEL PROBLEMS  UNUSUAL RASH Items with * indicate a potential emergency and should be followed up as soon as possible.  Feel free to call the clinic should you have any questions or concerns. The clinic phone number is [phone].  Please show the CHEMO ALERT CARD at check-in to the Emergency Department and triage nurse.   

## 2019-04-08 LAB — IGG, IGA, IGM
IgA: 2221 mg/dL — ABNORMAL HIGH (ref 87–352)
IgG (Immunoglobin G), Serum: 386 mg/dL — ABNORMAL LOW (ref 586–1602)
IgM (Immunoglobulin M), Srm: 9 mg/dL — ABNORMAL LOW (ref 26–217)

## 2019-04-08 LAB — KAPPA/LAMBDA LIGHT CHAINS
Kappa free light chain: 8.1 mg/L (ref 3.3–19.4)
Kappa, lambda light chain ratio: 0.01 — ABNORMAL LOW (ref 0.26–1.65)
Lambda free light chains: 811.6 mg/L — ABNORMAL HIGH (ref 5.7–26.3)

## 2019-04-08 LAB — BETA 2 MICROGLOBULIN, SERUM: Beta-2 Microglobulin: 2.8 mg/L — ABNORMAL HIGH (ref 0.6–2.4)

## 2019-04-13 ENCOUNTER — Other Ambulatory Visit: Payer: Self-pay | Admitting: Medical Oncology

## 2019-04-13 DIAGNOSIS — C9000 Multiple myeloma not having achieved remission: Secondary | ICD-10-CM

## 2019-04-13 NOTE — Progress Notes (Signed)
[facility] OFFICE PROGRESS NOTE  [name], MD 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  PRIOR THERAPY:  1) Systemic therapy with Velcade 1.3 mg/M2 weekly, Revlimid 25 mg p.o. daily for 14 days every 3 weeks in addition to weekly Decadron 40 mg orally. First dose of treatment 10/08/2017.Treatment was placed on hold due to development of a significant rash.Sheresumedtreatment with only dexamethasone and Velcade on 11/19/2017. This was discontinued on 04/14/2019 due to evidence of disease progression.   CURRENT THERAPY: Pomalyst 4 mg p.o. for 21 days every 4 weeks and 40 mg p.o. Decadron once a week. First dose expected around 04/21/2019.   INTERVAL HISTORY: Debbie Chan 69 y.o. female returns to the clinic for a follow up visit. The patient is feeling fairly well today without any concerning complaints except for continued imbalance and occasional numbness and tingling in her toes for which she is prescribed gabapentin. It is unclear if she is still taking this. Denies numbness in her hands/fingers. She denies any nausea, vomiting, diarrhea, or constipation. She denies any weight loss, lymphadenopathy, fever, chills, or night sweats. She denies any easy bleeding or bruising including. She recently had a myeloma panel performed. She is here today for evaluation before starting cycle 25 of Velcade.    MEDICAL HISTORY: Past Medical History:  Diagnosis Date  . Cancer ([place])     ALLERGIES:  has No Known Allergies.  MEDICATIONS:  Current Outpatient Medications  Medication Sig Dispense Refill  . blood glucose meter kit and supplies Dispense based on patient and insurance preference. Use up to four times daily as directed. (FOR ICD-10 E10.9, E11.9). 1 each 0  . dexamethasone (DECADRON) 4 MG tablet Take 10 tablets (40 mg total) by mouth every 7 (seven) days. 40 tablet 3  . metoprolol tartrate (LOPRESSOR) 25 MG  tablet Take 0.5 tablets (12.5 mg total) by mouth 2 (two) times daily. 30 tablet 3  . omeprazole (PRILOSEC) 40 MG capsule Take 1 capsule (40 mg total) by mouth daily. 30 capsule 2  . diphenhydrAMINE (BENADRYL ALLERGY) 25 MG tablet Take 1 tablet (25 mg total) by mouth every 6 (six) hours as needed for itching. (Patient not taking: Reported on 04/14/2019) 100 tablet 0  . furosemide (LASIX) 20 MG tablet Take once daily for 3 days for lower extremity edema, then stop. Repeat as needed for recurrent edema. (Patient not taking: Reported on 03/17/2019) 30 tablet 2  . gabapentin (NEURONTIN) 100 MG capsule Take 1 capsule (100 mg total) by mouth 3 (three) times daily. (Patient not taking: Reported on 04/14/2019) 90 capsule 2  . oxyCODONE-acetaminophen (PERCOCET) 5-325 MG tablet Take 2 tablets by mouth every 6 (six) hours as needed. (Patient not taking: Reported on 04/14/2019) 20 tablet 0  . polyethylene glycol (MIRALAX / GLYCOLAX) packet Take 17 g by mouth daily. (Patient not taking: Reported on 04/14/2019) 14 each 0  . prochlorperazine (COMPAZINE) 10 MG tablet Take 1 tablet (10 mg total) by mouth every 6 (six) hours as needed for nausea or vomiting. (Patient not taking: Reported on 03/17/2019) 30 tablet 0  . RELION GLUCOSE TEST STRIPS test strip USE 1 TO CHECK GLUCOSE ONCE DAILY    . ReliOn Ultra Thin Lancets 30G MISC USE 1 TO CHECK GLUCOSE ONCE DAILY FOR GLUCOSE TESTING    . traMADol (ULTRAM) 50 MG tablet Take 50 mg by mouth every 8 (eight) hours as needed.    . traZODone (DESYREL) 50 MG tablet  Take 1 tablet (50 mg total) by mouth at bedtime. (Patient not taking: Reported on 04/14/2019) 30 tablet 0  . warfarin (COUMADIN) 2 MG tablet Take 1 tablet (2 mg total) by mouth daily. 30 tablet 3   No current facility-administered medications for this visit.    SURGICAL HISTORY: No past surgical history on file.  REVIEW OF SYSTEMS:   Review of Systems  Constitutional: Negative for appetite change, chills, fatigue, fever  and unexpected weight change.  HENT: Negative for mouth sores, nosebleeds, sore throat and trouble swallowing.  Eyes: Negative for eye problems and icterus.  Respiratory: Negative for cough, hemoptysis, shortness of breath and wheezing.   Cardiovascular: Negative for chest pain and leg swelling.  Gastrointestinal: Negative for abdominal pain, constipation, diarrhea, nausea and vomiting.  Genitourinary: Negative for bladder incontinence, difficulty urinating, dysuria, frequency and hematuria.   Musculoskeletal: Negative for back pain, gait problem, neck pain and neck stiffness.  Skin: Negative for itching and rash.  Neurological: Negative for dizziness, extremity weakness, gait problem, headaches, light-headedness and seizures.  Hematological: Negative for adenopathy. Does not bruise/bleed easily.  Psychiatric/Behavioral: Negative for confusion, depression and sleep disturbance. The patient is not nervous/anxious.     PHYSICAL EXAMINATION:  Blood pressure 138/67, pulse 74, temperature 98.7 F (37.1 C), temperature source Temporal, resp. rate 18, height '5\' 8"'  (1.727 m), weight 206 lb 11.2 oz (93.8 kg), SpO2 100 %.  ECOG PERFORMANCE STATUS: 1 - Symptomatic but completely ambulatory  Physical Exam  Constitutional: Oriented to person, place, and time and well-developed, well-nourished, and in no distress.  HENT:  Head: Normocephalic and atraumatic.  Mouth/Throat: Oropharynx is clear and moist. No oropharyngeal exudate.  Eyes: Conjunctivae are normal. Right eye exhibits no discharge. Left eye exhibits no discharge. No scleral icterus.  Neck: Normal range of motion. Neck supple.  Cardiovascular: Normal rate, regular rhythm, normal heart sounds and intact distal pulses.   Pulmonary/Chest: Effort normal and breath sounds normal. No respiratory distress. No wheezes. No rales.  Abdominal: Soft. Bowel sounds are normal. Exhibits no distension and no mass. There is no tenderness.  Musculoskeletal:  Normal range of motion. Exhibits no edema.  Lymphadenopathy:    No cervical adenopathy.  Neurological: Alert and oriented to person, place, and time. Exhibits normal muscle tone. Gait normal. Coordination normal.  Skin: Skin is warm and dry. No rash noted. Not diaphoretic. No erythema. No pallor.  Psychiatric: Mood, memory and judgment normal.  Vitals reviewed.  LABORATORY DATA: Lab Results  Component Value Date   WBC 5.4 04/14/2019   HGB 13.2 04/14/2019   HCT 38.8 04/14/2019   MCV 90.2 04/14/2019   PLT 124 (L) 04/14/2019      Chemistry      Component Value Date/Time   NA 139 04/14/2019 1259   K 4.6 04/14/2019 1259   CL 105 04/14/2019 1259   CO2 25 04/14/2019 1259   BUN 16 04/14/2019 1259   CREATININE 1.25 (H) 04/14/2019 1259      Component Value Date/Time   CALCIUM 8.9 04/14/2019 1259   ALKPHOS 124 04/14/2019 1259   AST 13 (L) 04/14/2019 1259   ALT 9 04/14/2019 1259   BILITOT 0.4 04/14/2019 1259       RADIOGRAPHIC STUDIES:  No results found.   ASSESSMENT/PLAN:  This is a very pleasant 69 years old African female originally from [place] who is visiting her son in [place] and was diagnosed with IgA multiple myeloma.  The patient was a started initially on treatment with weekly  subcutaneous Velcade 1.3 mg/M2, Revlimid 25 mg p.o. daily for 21 days every 4 weeks as well as weekly Decadron 40 mg orally. She was tolerating the treatment well but she developed significant skin rash secondary to treatment with Revlimid and this was discontinued.  The patient continued treatment with subcutaneous Velcade and Decadron.  The patient was seen with Dr. Julien Nordmann today. Labs were reviewed. She had a repeat CBC, CMP, LDH, kappa/lambda light changes, quantitative immunoglobulins, and beta 2 microglobulin. Her myeloma panel showed progressive elevations in her lab values. Dr. Julien Nordmann recommends discontinuing her treatment with Velcade.   Dr. Julien Nordmann recommends  changing her treatment to Pomalyst 4 mg p.o. for 21 days every 4 weeks with decadron 40 mg once a week. She is expected to start treatment next week. Consent was obtained to begin treatment with Pomalyst.   The patient was instructed to discontinue acyclovir. I have sent a prescription for 2 mg p.o. daily of coumadin to her pharmacy for thromboprophylaxis that she can begin taking once she starts her Pomalyst. Due to the drug to drug interactions between coumadin and mobic, she was instructed to discontinue mobic.   We will see her for a follow up visit in 3 weeks for evaluation and repeat blood work.   The patient was advised to call immediately if she has any concerning symptoms in the interval. The patient voices understanding of current disease status and treatment options and is in agreement with the current care plan. All questions were answered. The patient knows to call the clinic with any problems, questions or concerns. We can certainly see the patient much sooner if necessary  Orders Placed This Encounter  Procedures  . CBC with Differential (Cancer Center Only)    Standing Status:   Future    Standing Expiration Date:   04/13/2020  . CMP ([facility] only)    Standing Status:   Future    Standing Expiration Date:   04/13/2020     Tobe Sos Asiyah Pineau, PA-C 04/14/19  ADDENDUM: Hematology/Oncology Attending: I had a face-to-face encounter with the patient today.  I recommended her care plan.  This is a very pleasant 69 years old African-American female with history of multiple myeloma diagnosed in July 2019 started initially on treatment with subcutaneous Velcade, Revlimid and Decadron but Revlimid was discontinued secondary to intolerance and significant skin rash not responding to treatment.  The patient continues her treatment with weekly subcutaneous Velcade and Decadron started on 11/19/2017 and discontinued on 04/14/2019. She had evidence for disease progression on the  recent myeloma panel.  I had a lengthy discussion with the patient today about her current condition and other treatment options. I recommended for the patient treatment with Pomalyst 4 mg p.o. daily for 21 days every 4 weeks in addition to weekly Decadron 40 mg orally. We will also start her on low-dose Coumadin 2 mg p.o. daily for DVT prophylaxis. I discussed with the patient the adverse effect of this treatment and she will sign the consent for the chemotherapy today. We will see her back for follow-up visit in 4 weeks for reevaluation and repeat blood work. The patient was advised to call immediately if she has any other concerning symptoms in the interval.  Disclaimer: This note was dictated with voice recognition software. Similar sounding words can inadvertently be transcribed and may be missed upon review.

## 2019-04-14 ENCOUNTER — Inpatient Hospital Stay: Payer: Self-pay

## 2019-04-14 ENCOUNTER — Telehealth: Payer: Self-pay | Admitting: Pharmacist

## 2019-04-14 ENCOUNTER — Inpatient Hospital Stay (HOSPITAL_BASED_OUTPATIENT_CLINIC_OR_DEPARTMENT_OTHER): Payer: Self-pay | Admitting: Physician Assistant

## 2019-04-14 ENCOUNTER — Other Ambulatory Visit: Payer: Self-pay

## 2019-04-14 ENCOUNTER — Encounter: Payer: Self-pay | Admitting: Physician Assistant

## 2019-04-14 VITALS — BP 138/67 | HR 74 | Temp 98.7°F | Resp 18 | Ht 68.0 in | Wt 206.7 lb

## 2019-04-14 DIAGNOSIS — Z7189 Other specified counseling: Secondary | ICD-10-CM

## 2019-04-14 DIAGNOSIS — C9000 Multiple myeloma not having achieved remission: Secondary | ICD-10-CM

## 2019-04-14 LAB — CBC WITH DIFFERENTIAL (CANCER CENTER ONLY)
Abs Immature Granulocytes: 0.03 10*3/uL (ref 0.00–0.07)
Basophils Absolute: 0 10*3/uL (ref 0.0–0.1)
Basophils Relative: 0 %
Eosinophils Absolute: 0 10*3/uL (ref 0.0–0.5)
Eosinophils Relative: 0 %
HCT: 38.8 % (ref 36.0–46.0)
Hemoglobin: 13.2 g/dL (ref 12.0–15.0)
Immature Granulocytes: 1 %
Lymphocytes Relative: 13 %
Lymphs Abs: 0.7 10*3/uL (ref 0.7–4.0)
MCH: 30.7 pg (ref 26.0–34.0)
MCHC: 34 g/dL (ref 30.0–36.0)
MCV: 90.2 fL (ref 80.0–100.0)
Monocytes Absolute: 0.2 10*3/uL (ref 0.1–1.0)
Monocytes Relative: 3 %
Neutro Abs: 4.5 10*3/uL (ref 1.7–7.7)
Neutrophils Relative %: 83 %
Platelet Count: 124 10*3/uL — ABNORMAL LOW (ref 150–400)
RBC: 4.3 MIL/uL (ref 3.87–5.11)
RDW: 13.3 % (ref 11.5–15.5)
WBC Count: 5.4 10*3/uL (ref 4.0–10.5)
nRBC: 0 % (ref 0.0–0.2)

## 2019-04-14 LAB — CMP (CANCER CENTER ONLY)
ALT: 9 U/L (ref 0–44)
AST: 13 U/L — ABNORMAL LOW (ref 15–41)
Albumin: 3.7 g/dL (ref 3.5–5.0)
Alkaline Phosphatase: 124 U/L (ref 38–126)
Anion gap: 9 (ref 5–15)
BUN: 16 mg/dL (ref 8–23)
CO2: 25 mmol/L (ref 22–32)
Calcium: 8.9 mg/dL (ref 8.9–10.3)
Chloride: 105 mmol/L (ref 98–111)
Creatinine: 1.25 mg/dL — ABNORMAL HIGH (ref 0.44–1.00)
GFR, Est AFR Am: 51 mL/min — ABNORMAL LOW (ref >60)
GFR, Estimated: 44 mL/min — ABNORMAL LOW (ref >60)
Glucose, Bld: 107 mg/dL — ABNORMAL HIGH (ref 70–99)
Potassium: 4.6 mmol/L (ref 3.5–5.1)
Sodium: 139 mmol/L (ref 135–145)
Total Bilirubin: 0.4 mg/dL (ref 0.3–1.2)
Total Protein: 7.7 g/dL (ref 6.5–8.1)

## 2019-04-14 MED ORDER — WARFARIN SODIUM 2 MG PO TABS: 2.0000 mg | ORAL_TABLET | Freq: Every day | ORAL | 3 refills | Status: DC

## 2019-04-14 MED FILL — WARFARIN SODIUM 2 MG TABLET: 2 | 30 days supply | Qty: 30 | Fill #0

## 2019-04-14 NOTE — Progress Notes (Signed)
Faxed Pomalyst enrollment forms to [facility].  Pt's  ID with [employer] is   [id number].

## 2019-04-14 NOTE — Telephone Encounter (Addendum)
Oral Oncology Pharmacist Encounter  Received new prescription for Pomalyst (pomalidomide) for the treatment of multiple myeloma in conjunction with dexamethasone, planned duration until disease progression or unacceptable drug toxicity.  CMP from 04/14/19 assessed, no relevant lab abnormalities. Prescription dose and frequency assessed.   Current medication list in Epic reviewed, no relevant DDIs with pomalidomide identified.  Patient is uninsured and will need to proceed with manufacturer assistance  Oral Oncology Clinic will continue to follow for manufacturer assistance approval, initial counseling, and start date.  Darl Pikes, PharmD, BCPS, [facility] Hematology/Oncology Clinical Pharmacist [facility]/HP/AP [facility] [phone]  04/14/2019 3:14 PM

## 2019-04-14 NOTE — Patient Instructions (Addendum)
-We are changing treatment. The new medicine is a pill called Pomalidomide (Pomalyst). This medication is a pill that you take ONCE a day for 21 days. Then you take 7 days (1 week) where you do not take the medication. Then you start again for 21 days taking the medication with one week off.  -You will also still need to take the decadron (steroid) 10 tablets by mouth once a week.  -We will see you in 3 weeks for a follow up visit and blood work.     Pomalidomide oral capsules What is this medicine? POMALIDOMIDE (pom a LID oh mide) is a chemotherapy drug used to treat multiple myeloma and Kaposi sarcoma. It targets specific proteins within cancer cells and stops the cancer cell from growing. This medicine may be used for other purposes; ask your health care provider or pharmacist if you have questions. COMMON BRAND NAME(S): POMALYST What should I tell my health care provider before I take this medicine? They need to know if you have any of these conditions:  high blood pressure  high cholesterol  history of blood clots  irregular monthly periods or menstrual cycles  kidney disease  liver disease  smoke tobacco  an unusual or allergic reaction to pomalidomide, other medicines, foods, dyes, or preservatives  pregnant or trying to get pregnant  breast-feeding How should I use this medicine? Take this medicine by mouth with a glass of water. Follow the directions on the prescription label. You can take it with or without food. If it upsets your stomach, take it with food. Do not cut, crush, or chew this medicine. Take your medicine at regular intervals. Do not take it more often than directed. Do not stop taking except on your doctor's advice. A special MedGuide will be given to you by the pharmacist with each prescription and refill. Be sure to read this information carefully each time. Talk to your pediatrician regarding the use of this medicine in children. Special care may be  needed. Overdosage: If you think you have taken too much of this medicine contact a poison control center or emergency room at once. NOTE: This medicine is only for you. Do not share this medicine with others. What if I miss a dose? If you miss a dose, take it as soon as you can. If your next dose is to be taken in less than 12 hours, then do not take the missed dose. Take the next dose at your regular time. Do not take double or extra doses. What may interact with this medicine? This medicine may interact with the following medications:  ciprofloxacin  fluvoxamine  tobacco (cigarettes) This list may not describe all possible interactions. Give your health care provider a list of all the medicines, herbs, non-prescription drugs, or dietary supplements you use. Also tell them if you smoke, drink alcohol, or use illegal drugs. Some items may interact with your medicine. What should I watch for while using this medicine? This drug may make you feel generally unwell. This is not uncommon, as chemotherapy can affect healthy cells as well as cancer cells. Report any side effects. Continue your course of treatment even though you feel ill unless your doctor tells you to stop. You may need blood work done while you are taking this medicine. This medicine may cause serious skin reactions. They can happen weeks to months after starting the medicine. Contact your health care provider right away if you notice fevers or flu-like symptoms with a rash. The  rash may be red or purple and then turn into blisters or peeling of the skin. Or, you might notice a red rash with swelling of the face, lips or lymph nodes in your neck or under your arms. This medicine is available only through a special program. Doctors, pharmacies, and patients must meet all of the conditions of the program. Your health care provider will help you get signed up with the program if you need this medicine. Through the program you will only  receive up to a 28 day supply of the medicine at one time. You will need a new prescription for each refill. This medicine can cause birth defects. Do not get pregnant for at least 4 weeks before, during, and for at least 4 weeks after taking this drug. Females with child-bearing potential will need to have 2 negative pregnancy tests before starting this medicine. Pregnancy testing must be done every 2 to 4 weeks as directed while taking this medicine. Use 2 reliable forms of birth control together while you are taking this medicine and for 4 weeks after you stop taking this medicine. If you think that you might be pregnant talk to your doctor right away. Men must use a latex condom during sexual contact with a woman while taking this medicine and for 4 weeks after you stop taking this medicine. A latex condom is needed even if you have had a vasectomy. Contact your doctor right away if your partner becomes pregnant. Do not donate sperm while taking this medicine and for 4 weeks after you stop taking this medicine. Do not give blood while taking the medicine and for 1 month after completion of treatment to avoid exposing pregnant women to the medicine through the donated blood. Talk to your doctor about your risk of cancer. You may be more at risk for certain types of cancers if you take this medicine. If you smoke, tell your doctor if you notice this medicine is not working well for you. Talk to your doctor if you are a smoker or if you decide to stop smoking. What side effects may I notice from receiving this medicine? Side effects that you should report to your doctor or health care professional as soon as possible:  allergic reactions like skin rash, itching or hives, swelling of the face, lips, or tongue  fast heartbeat  feeling faint  low blood counts - this medicine may decrease the number of white blood cells, red blood cells and platelets. You may be at increased risk for infections and  bleeding  rash, fever, and swollen lymph nodes  redness, blistering, peeling or loosening of the skin, including inside the mouth  signs and symptoms of a blood clot such as breathing problems; changes in vision; chest pain; severe, sudden headache; pain, swelling, warmth in the leg; trouble speaking; sudden numbness or weakness of the face, arm or leg  signs and symptoms of liver injury like dark yellow or brown urine; general ill feeling or flu-like symptoms; light-colored stools; loss of appetite; nausea; right upper belly pain; unusually weak or tired; yellowing of the eyes or skin  signs and symptoms of a stroke like changes in vision; confusion; trouble speaking or understanding; severe headaches; sudden numbness or weakness of the face, arm or leg; trouble walking; dizziness; loss of balance or coordination  sweating  tingling, numbness in the hands or feet  trouble swallowing  unusual bleeding or bruising Side effects that usually do not require medical attention (report to your  doctor or health care professional if they continue or are bothersome):  back pain  constipation  diarrhea  nausea  tiredness This list may not describe all possible side effects. Call your doctor for medical advice about side effects. You may report side effects to FDA at 1-800-FDA-1088. Where should I keep my medicine? Keep out of the reach of children. Store between 20 and 25 degrees C (68 and 77 degrees F). Throw away any unused medicine after the expiration date. NOTE: This sheet is a summary. It may not cover all possible information. If you have questions about this medicine, talk to your doctor, pharmacist, or health care provider.  2020 Elsevier/Gold Standard (2018-07-09 16:55:02)

## 2019-04-15 ENCOUNTER — Telehealth: Payer: Self-pay | Admitting: Internal Medicine

## 2019-04-15 NOTE — Telephone Encounter (Signed)
Scheduled per los. Called and spoke with patient. Confirmed appt 

## 2019-04-18 ENCOUNTER — Other Ambulatory Visit: Payer: Self-pay

## 2019-04-18 DIAGNOSIS — C9000 Multiple myeloma not having achieved remission: Secondary | ICD-10-CM

## 2019-04-18 MED ORDER — POMALIDOMIDE 4 MG PO CAPS: 4.0000 mg | ORAL_CAPSULE | Freq: Every day | ORAL | 0 refills | Status: DC

## 2019-04-18 NOTE — Progress Notes (Signed)
Pomalyst Auth number obtained, will fax to [facility] outpatient pharmacy after Dr. Worthy Flank signature.

## 2019-04-19 ENCOUNTER — Telehealth: Payer: Self-pay

## 2019-04-19 NOTE — Telephone Encounter (Signed)
I am not expecting her disease to progress that much while she is waiting for the new drug.  She also need a washout period from the previous medication before starting the new one.

## 2019-04-19 NOTE — Telephone Encounter (Signed)
Patient's son called concerned about disease progression while she waits to start the new regimen. Informed him that we are still waiting on the patient assistance for the medication to go through and to call back by the end of next week if he has not heard from [facility] (pharmacy).

## 2019-04-20 ENCOUNTER — Telehealth: Payer: Self-pay

## 2019-04-20 ENCOUNTER — Other Ambulatory Visit: Payer: Self-pay | Admitting: Pharmacist

## 2019-04-20 DIAGNOSIS — C9000 Multiple myeloma not having achieved remission: Secondary | ICD-10-CM

## 2019-04-20 MED ORDER — POMALIDOMIDE 4 MG PO CAPS: 4.0000 mg | ORAL_CAPSULE | Freq: Every day | ORAL | 0 refills | Status: DC

## 2019-04-20 NOTE — Telephone Encounter (Signed)
Oral Chemotherapy Pharmacist Encounter   Pomalyst prescription escribed to [facility].  Darl Pikes, PharmD, BCPS, [facility] Hematology/Oncology Clinical Pharmacist [facility]/AP Oral [facility] [phone]  04/20/2019 10:36 AM

## 2019-04-20 NOTE — Telephone Encounter (Addendum)
Oral Oncology Patient Advocate Encounter  Received notification from [facility] Patient Assistance program that patient has been successfully enrolled into their program to receive Pomalyst from the manufacturer at $0 out of pocket until 10/16/19.   Prescription will be sent to [facility] by [facility].    Phone:  [phone] Fax:  [phone] [place]: [facility] Address: [address], [address]   [place] [address] [place]:  [id number]  I called and spoke with patient.  He/She knows we will have to re-apply.   Patient knows to call the office with questions or concerns.   Oral Oncology Clinic will continue to follow.  [facility] Patient Portal Phone [phone] Fax [phone] 04/20/2019 8:44 AM

## 2019-04-20 NOTE — Telephone Encounter (Signed)
TCT patient and spoke with son that Dr. Julien Nordmann does not expect her disease to progress that much while waiting, and she needs a washout period from the previous medication before starting the new one. Son verbalized understanding.

## 2019-04-21 ENCOUNTER — Inpatient Hospital Stay: Payer: Self-pay

## 2019-04-28 ENCOUNTER — Other Ambulatory Visit: Payer: Self-pay

## 2019-04-28 ENCOUNTER — Ambulatory Visit: Payer: Self-pay

## 2019-05-02 ENCOUNTER — Telehealth: Payer: Self-pay | Admitting: Medical Oncology

## 2019-05-02 NOTE — Telephone Encounter (Signed)
Pomalyst reaction?- generalized pain and blisters on fingers 2 days after starting pomalyst. Appt Thursday . I offered [facility] ,but son said to keep appt as is.

## 2019-05-05 ENCOUNTER — Other Ambulatory Visit: Payer: Self-pay

## 2019-05-05 ENCOUNTER — Inpatient Hospital Stay: Payer: Self-pay

## 2019-05-05 ENCOUNTER — Inpatient Hospital Stay: Payer: Self-pay | Attending: Internal Medicine | Admitting: Physician Assistant

## 2019-05-05 ENCOUNTER — Telehealth: Payer: Self-pay | Admitting: *Deleted

## 2019-05-05 ENCOUNTER — Other Ambulatory Visit: Payer: Self-pay | Admitting: Internal Medicine

## 2019-05-05 ENCOUNTER — Ambulatory Visit: Payer: Self-pay

## 2019-05-05 ENCOUNTER — Encounter: Payer: Self-pay | Admitting: Physician Assistant

## 2019-05-05 VITALS — BP 116/46 | HR 93 | Temp 99.2°F | Resp 17 | Ht 68.0 in | Wt 213.8 lb

## 2019-05-05 DIAGNOSIS — C9000 Multiple myeloma not having achieved remission: Secondary | ICD-10-CM

## 2019-05-05 DIAGNOSIS — R519 Headache, unspecified: Secondary | ICD-10-CM | POA: Insufficient documentation

## 2019-05-05 DIAGNOSIS — M2550 Pain in unspecified joint: Secondary | ICD-10-CM | POA: Insufficient documentation

## 2019-05-05 DIAGNOSIS — T7840XA Allergy, unspecified, initial encounter: Secondary | ICD-10-CM

## 2019-05-05 DIAGNOSIS — R21 Rash and other nonspecific skin eruption: Secondary | ICD-10-CM | POA: Insufficient documentation

## 2019-05-05 DIAGNOSIS — Z7189 Other specified counseling: Secondary | ICD-10-CM

## 2019-05-05 DIAGNOSIS — M791 Myalgia, unspecified site: Secondary | ICD-10-CM | POA: Insufficient documentation

## 2019-05-05 DIAGNOSIS — Z79899 Other long term (current) drug therapy: Secondary | ICD-10-CM | POA: Insufficient documentation

## 2019-05-05 DIAGNOSIS — Z5111 Encounter for antineoplastic chemotherapy: Secondary | ICD-10-CM | POA: Insufficient documentation

## 2019-05-05 DIAGNOSIS — Z7901 Long term (current) use of anticoagulants: Secondary | ICD-10-CM | POA: Insufficient documentation

## 2019-05-05 DIAGNOSIS — G629 Polyneuropathy, unspecified: Secondary | ICD-10-CM | POA: Insufficient documentation

## 2019-05-05 LAB — CBC WITH DIFFERENTIAL (CANCER CENTER ONLY)
Abs Immature Granulocytes: 0.05 10*3/uL (ref 0.00–0.07)
Basophils Absolute: 0 10*3/uL (ref 0.0–0.1)
Basophils Relative: 0 %
Eosinophils Absolute: 0.3 10*3/uL (ref 0.0–0.5)
Eosinophils Relative: 4 %
HCT: 32.6 % — ABNORMAL LOW (ref 36.0–46.0)
Hemoglobin: 11.3 g/dL — ABNORMAL LOW (ref 12.0–15.0)
Immature Granulocytes: 1 %
Lymphocytes Relative: 6 %
Lymphs Abs: 0.5 10*3/uL — ABNORMAL LOW (ref 0.7–4.0)
MCH: 31.1 pg (ref 26.0–34.0)
MCHC: 34.7 g/dL (ref 30.0–36.0)
MCV: 89.8 fL (ref 80.0–100.0)
Monocytes Absolute: 0.2 10*3/uL (ref 0.1–1.0)
Monocytes Relative: 2 %
Neutro Abs: 7.6 10*3/uL (ref 1.7–7.7)
Neutrophils Relative %: 87 %
Platelet Count: 83 10*3/uL — ABNORMAL LOW (ref 150–400)
RBC: 3.63 MIL/uL — ABNORMAL LOW (ref 3.87–5.11)
RDW: 13.4 % (ref 11.5–15.5)
WBC Count: 8.7 10*3/uL (ref 4.0–10.5)
nRBC: 0 % (ref 0.0–0.2)

## 2019-05-05 LAB — CMP (CANCER CENTER ONLY)
ALT: 16 U/L (ref 0–44)
AST: 16 U/L (ref 15–41)
Albumin: 2.8 g/dL — ABNORMAL LOW (ref 3.5–5.0)
Alkaline Phosphatase: 67 U/L (ref 38–126)
Anion gap: 11 (ref 5–15)
BUN: 15 mg/dL (ref 8–23)
CO2: 25 mmol/L (ref 22–32)
Calcium: 8 mg/dL — ABNORMAL LOW (ref 8.9–10.3)
Chloride: 98 mmol/L (ref 98–111)
Creatinine: 1.53 mg/dL — ABNORMAL HIGH (ref 0.44–1.00)
GFR, Est AFR Am: 40 mL/min — ABNORMAL LOW (ref >60)
GFR, Estimated: 35 mL/min — ABNORMAL LOW (ref >60)
Glucose, Bld: 146 mg/dL — ABNORMAL HIGH (ref 70–99)
Potassium: 4.6 mmol/L (ref 3.5–5.1)
Sodium: 134 mmol/L — ABNORMAL LOW (ref 135–145)
Total Bilirubin: 0.9 mg/dL (ref 0.3–1.2)
Total Protein: 6.1 g/dL — ABNORMAL LOW (ref 6.5–8.1)

## 2019-05-05 MED ORDER — DEXAMETHASONE 4 MG PO TABS: 40.0000 mg | ORAL_TABLET | ORAL | 3 refills | Status: DC

## 2019-05-05 MED ORDER — LIDOCAINE-PRILOCAINE 2.5-2.5 % EX CREA: 1.0000 "application " | TOPICAL_CREAM | CUTANEOUS | 2 refills | Status: DC | PRN

## 2019-05-05 MED ORDER — ACYCLOVIR 200 MG PO CAPS: 200.0000 mg | ORAL_CAPSULE | Freq: Two times a day (BID) | ORAL | 2 refills | Status: DC

## 2019-05-05 MED ORDER — METHYLPREDNISOLONE 4 MG PO TBPK: ORAL_TABLET | ORAL | 0 refills | Status: DC

## 2019-05-05 MED FILL — METHYLPREDNISOLONE 4 MG TBP: 4 | 6 days supply | Qty: 21 | Fill #0

## 2019-05-05 MED FILL — ACYCLOVIR 200 MG CAP: 200 | 30 days supply | Qty: 60 | Fill #0

## 2019-05-05 MED FILL — LIDOCAINE-PRILOCAINE CREAM: 2.5-2.5 | 5 days supply | Qty: 30 | Fill #0

## 2019-05-05 MED FILL — DEXAMETHASONE 4 MG TABLET: 4 | 30 days supply | Qty: 40 | Fill #0

## 2019-05-05 NOTE — Progress Notes (Signed)
DISCONTINUE ON PATHWAY REGIMEN - Multiple Myeloma and Other Plasma Cell Dyscrasias     A cycle is every 21 days:     Bortezomib      Lenalidomide      Dexamethasone   **Always confirm dose/schedule in your pharmacy ordering system**  REASON: Disease Progression PRIOR TREATMENT: [id number]: VRd (Bortezomib 1.3 mg/m2 Subcut D1, 8, 15 + Lenalidomide 25 mg + Dexamethasone 40 mg) q21 Days x 8 Cycles TREATMENT RESPONSE: Progressive Disease (PD)  START ON PATHWAY REGIMEN - Multiple Myeloma and Other Plasma Cell Dyscrasias     A cycle is every 28 days:     Dexamethasone      Cyclophosphamide      Carfilzomib      Carfilzomib      Carfilzomib   **Always confirm dose/schedule in your pharmacy ordering system**  Patient Characteristics: Multiple Myeloma, Relapsed / Refractory, Second through Fourth Lines of Therapy Disease Classification: Multiple Myeloma R-ISS Staging: III Therapeutic Status: Relapsed Line of Therapy: Third Line Intent of Therapy: Non-Curative / Palliative Intent, Discussed with Patient

## 2019-05-05 NOTE — Progress Notes (Signed)
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  PRIOR THERAPY: 1) Systemic therapy with Velcade 1.3 mg/M2 weekly, Revlimid 25 mg p.o. daily for 14 days every 3 weeks in addition to weekly Decadron 40 mg orally. First dose of treatment 10/08/2017.Treatment was placed on hold due to development of a significant rash.Sheresumedtreatment with only dexamethasone and Velcade on 11/19/2017. This was discontinued on 04/14/2019 due to evidence of disease progression.  2) Pomalyst 4 mg p.o. for 21 days every 4 weeks and 40 mg p.o. Decadron once a week. First dose 04/29/2019-05/04/2019. Status post 6 days of treatment. Discontinued secondary to allergy to Pomalyst.   CURRENT THERAPY: Chemotherapy with carfilzomib on days 1, 2, 8, 9, 15, and 16 and Cytoxan 300 mg/m2 on days 1, 8, and 15 IV every 4 weeks and 40 mg p.o. weekly of Decadron. First dose expected on 05/12/2019.   INTERVAL HISTORY: Norah Devin 69 y.o. female returns to the clinic for a follow-up visit.  The patient's multiple myeloma panel last month showed evidence of disease progression.  Therefore, her treatment with Velcade and dexamethasone was discontinued.  She then started on Pomalyst 4 mg p.o. for 21 days was started on April 29, 2019.  She had been taking this for a total of 6 days, her last dose being last night.  Since starting her Pomalyst, the patient is endorsing erythema and pain of her hands and forearms bilaterally.  Per patient report, this started as blisters on her fingers 2 days into starting Pomalyst. She also is experiencing dry flaky skin by her mouth/lips.  Denies any throat tightness, cough, or dyspnea. No blisters or lesions within her mucous membranes/oral mucosa. She has been experiencing migratory arthralgias and myalgias.  She also is endorsing a headache since starting her treatment.  She had a  similar allergic reaction in the past to Revlimid.  She denies any fever, night sweats, or weight loss.  She continues to report peripheral neuropathy in her toes for which she is prescribed gabapentin.  Denies any nausea, vomiting, diarrhea, or constipation.  Denies any bleeding or bruising.  She is here today for evaluation and repeat blood work.   MEDICAL HISTORY: Past Medical History:  Diagnosis Date  . Cancer ([facility] [place])     ALLERGIES:  has No Known Allergies.  MEDICATIONS:  Current Outpatient Medications  Medication Sig Dispense Refill  . blood glucose meter kit and supplies Dispense based on patient and insurance preference. Use up to four times daily as directed. (FOR ICD-10 E10.9, E11.9). 1 each 0  . dexamethasone (DECADRON) 4 MG tablet Take 10 tablets (40 mg total) by mouth every 7 (seven) days. 40 tablet 3  . diphenhydrAMINE (BENADRYL ALLERGY) 25 MG tablet Take 1 tablet (25 mg total) by mouth every 6 (six) hours as needed for itching. (Patient not taking: Reported on 04/14/2019) 100 tablet 0  . furosemide (LASIX) 20 MG tablet Take once daily for 3 days for lower extremity edema, then stop. Repeat as needed for recurrent edema. (Patient not taking: Reported on 03/17/2019) 30 tablet 2  . gabapentin (NEURONTIN) 100 MG capsule Take 1 capsule (100 mg total) by mouth 3 (three) times daily. (Patient not taking: Reported on 04/14/2019) 90 capsule 2  . metoprolol tartrate (LOPRESSOR) 25 MG tablet Take 0.5 tablets (12.5 mg total) by mouth 2 (two) times daily. 30 tablet 3  . omeprazole (PRILOSEC) 40 MG capsule  Take 1 capsule (40 mg total) by mouth daily. 30 capsule 2  . oxyCODONE-acetaminophen (PERCOCET) 5-325 MG tablet Take 2 tablets by mouth every 6 (six) hours as needed. (Patient not taking: Reported on 04/14/2019) 20 tablet 0  . polyethylene glycol (MIRALAX / GLYCOLAX) packet Take 17 g by mouth daily. (Patient not taking: Reported on 04/14/2019) 14 each 0  . pomalidomide (POMALYST) 4 MG capsule Take  1 capsule (4 mg total) by mouth daily. Take for 21 days, then hold for 7 days. Repeat every 28 days. 21 capsule 0  . prochlorperazine (COMPAZINE) 10 MG tablet Take 1 tablet (10 mg total) by mouth every 6 (six) hours as needed for nausea or vomiting. (Patient not taking: Reported on 03/17/2019) 30 tablet 0  . RELION GLUCOSE TEST STRIPS test strip USE 1 TO CHECK GLUCOSE ONCE DAILY    . ReliOn Ultra Thin Lancets 30G MISC USE 1 TO CHECK GLUCOSE ONCE DAILY FOR GLUCOSE TESTING    . traMADol (ULTRAM) 50 MG tablet Take 50 mg by mouth every 8 (eight) hours as needed.    . traZODone (DESYREL) 50 MG tablet Take 1 tablet (50 mg total) by mouth at bedtime. (Patient not taking: Reported on 04/14/2019) 30 tablet 0  . warfarin (COUMADIN) 2 MG tablet Take 1 tablet (2 mg total) by mouth daily. 30 tablet 3   No current facility-administered medications for this visit.    SURGICAL HISTORY: No past surgical history on file.  REVIEW OF SYSTEMS:   Review of Systems  Constitutional: Negative for appetite change, chills, fatigue, fever and unexpected weight change.  HENT: Negative for mouth sores, nosebleeds, sore throat and trouble swallowing.  Negative for throat tightness. Eyes: Negative for eye problems and icterus.  Respiratory: Negative for cough, hemoptysis, shortness of breath and wheezing.   Cardiovascular: Positive for mild bilateral lower extremity swelling. Negative for chest pain.  Gastrointestinal: Negative for abdominal pain, constipation, diarrhea, nausea and vomiting.  Genitourinary: Negative for bladder incontinence, difficulty urinating, dysuria, frequency and hematuria.   Musculoskeletal: Positive for migratory arthralgias. Skin: Positive for erythema and pain bilaterally on palms of hands and up the forearms.  Positive for dry skin around mouth/face.  Neurological: Positive for headache and dizziness.  Negative for extremity weakness, gait problem, light-headedness and seizures.  Hematological:  Negative for adenopathy. Does not bruise/bleed easily.  Psychiatric/Behavioral: Negative for confusion, depression and sleep disturbance. The patient is not nervous/anxious.     PHYSICAL EXAMINATION:  There were no vitals taken for this visit.  ECOG PERFORMANCE STATUS: 1 - Symptomatic but completely ambulatory  Physical Exam  Constitutional: Oriented to person, place, and time and well-developed, well-nourished, and in no distress.  HENT:  Head: Normocephalic and atraumatic.  Mouth/Throat: Oropharynx is clear and moist. No oropharyngeal exudate.  Eyes: Conjunctivae are normal. Right eye exhibits no discharge. Left eye exhibits no discharge. No scleral icterus.  Neck: Normal range of motion. Neck supple.  Cardiovascular: Normal rate, regular rhythm, normal heart sounds and intact distal pulses.   Pulmonary/Chest: Effort normal and breath sounds normal. No respiratory distress. No wheezes. No rales.  Abdominal: Soft. Bowel sounds are normal. Exhibits no distension and no mass. There is no tenderness.  Musculoskeletal: Normal range of motion. Exhibits no edema.  Lymphadenopathy:    No cervical adenopathy.  Neurological: Alert and oriented to person, place, and time. Exhibits normal muscle tone. Gait normal. Coordination normal.  Skin: Erythema and swelling of her hands bilaterally.  Erythema extends up her forearms bilaterally.  Dry  flaky skin around her mouth.   Psychiatric: Mood, memory and judgment normal.  Vitals reviewed.  LABORATORY DATA: Lab Results  Component Value Date   WBC 8.7 05/05/2019   HGB 11.3 (L) 05/05/2019   HCT 32.6 (L) 05/05/2019   MCV 89.8 05/05/2019   PLT 83 (L) 05/05/2019      Chemistry      Component Value Date/Time   NA 139 04/14/2019 1259   K 4.6 04/14/2019 1259   CL 105 04/14/2019 1259   CO2 25 04/14/2019 1259   BUN 16 04/14/2019 1259   CREATININE 1.25 (H) 04/14/2019 1259      Component Value Date/Time   CALCIUM 8.9 04/14/2019 1259   ALKPHOS  124 04/14/2019 1259   AST 13 (L) 04/14/2019 1259   ALT 9 04/14/2019 1259   BILITOT 0.4 04/14/2019 1259       RADIOGRAPHIC STUDIES:  No results found.   ASSESSMENT/PLAN:  This is a very pleasant 69 years old African female originally from [place] who is visiting her son in [place] and was diagnosed with IgA multiple myeloma.  The patient was a started initially on treatment with weekly subcutaneous Velcade 1.3 mg/M2, Revlimid 25 mg p.o. daily for 21 days every 4 weeks as well as weekly Decadron 40 mg orally. She was tolerating the treatment well but she developed significant skin rash secondary to treatment with Revlimid and this was discontinued.  The patient continued treatment with subcutaneous Velcade and Decadron.  She developed evidence of disease progression in February 2021.  Her treatment was discontinued.  The patient was then started on Pomalyst 4 mg p.o. daily for 21 days on every 4 weeks with weekly Decadron 40 mg p.o.  She is status post 6 days of treatment.  She developed a significant skin rash/reaction to Pomalyst.  The patient was seen with Dr. Julien Nordmann today.  Dr. Julien Nordmann recommends we discontinue treatment with Pomalyst.   Dr. Maxie Barb recommends that we start treatment with Carfilzomib on days 1, 2, 8, 9, 15, and 16 and Cytoxan on days 1, 8, and 15 IV every 4 weeks with 40 mg p.o. weekly of Decadron.  She is expected to start her first dose on 05/12/2019.  She is interested in this option.  We will see her back for follow-up visit in 1 week before starting her first treatment with Carfilzomib and Cytoxan.   I will arrange for the patient to have a Port-A-Cath placed prior to starting her first cycle of chemotherapy.  I have also sent EMLA cream to her pharmacy.  The patient was instructed to discontinue Pomalyst and Coumadin.  I have sent acyclovir 200 mg twice daily to her pharmacy.  I have also sent a refill of Decadron.  I have also sent a Medrol  Dosepak to her pharmacy for her skin reaction to Pomalyst.  Also encouraged her to take Benadryl if needed.  Discussed with the patient that this might make her feel drowsy.  The patient was advised to call immediately if she has any concerning symptoms in the interval. The patient voices understanding of current disease status and treatment options and is in agreement with the current care plan. All questions were answered. The patient knows to call the clinic with any problems, questions or concerns. We can certainly see the patient much sooner if necessary     No orders of the defined types were placed in this encounter.    Casson Catena L Daisean Brodhead, PA-C 05/05/19  ADDENDUM: Hematology/Oncology Attending:  I had a face-to-face encounter with the patient today.  I recommended her care plan.  This is a very pleasant 69 years old African female who originally from [place].  She was diagnosed with multiple myeloma in July 2019 status post treatment with initially subcutaneous Velcade, Revlimid and Decadron but discontinued secondary to hypersensitivity reaction.  The patient continued treatment with subcutaneous Velcade and Decadron until recently.  She was found to have evidence for disease progression.  I recommended for the patient second line treatment with Pomalyst and Decadron but the patient developed significant hypersensitivity reaction with skin rash and swelling of the extremities and eyes after few days of her treatment.  I recommended for the patient to discontinue her current treatment with Pomalyst at this point.  We will start the patient on Medrol Dosepak as well as Benadryl for her hypersensitivity reaction to Pomalyst. I had a lengthy discussion with the patient as well as her son who was available by phone during the visit about her current condition and other treatment options.  I recommended for the patient treatment with carfilzomib, Cytoxan and Decadron.  I discussed with him  the adverse effect of this treatment.  We will also provide the patient with written information about her regimen. We will start her on prophylactic acyclovir. We will also arrange for the patient to have a Port-A-Cath placed before starting the first dose of her treatment next week. She will come back for follow-up visit next week for evaluation and close monitoring. The patient and her son agreed to the current plan. She was advised to call immediately if she has any concerning symptoms in the interval.  Disclaimer: This note was dictated with voice recognition software. Similar sounding words can inadvertently be transcribed and may be missed upon review. Eilleen Kempf, MD 05/05/19

## 2019-05-05 NOTE — Patient Instructions (Addendum)
-Stop taking the pomalyst. You are allergic to this medication. Please pick up and take the medrol  dosepak that I am sending to your pharmacy. You can also take benadryl for swelling but it may make you sleepy.   -Stop taking warfarin/Coumadin. -I am sending prescription for acyclovir to your pharmacy. Please take 1 tablet twice a day everyday.   -Continue taking decadron as prescribed 10 tablets once a week (on Thursdays). I sent refill to your pharmacy.  -We are changing treatment to two drugs which are given through the veins. Treatment is two consecutive days Thursday and Friday for 3 weeks in a row then one week off. I have information about these drugs listed below.  -I will arrange for a port-a-cath. This is a minor surgical procedure where they put a device under your skin which allows them to give you your treatment. I sent a cream to your pharmacy. Starting 1 week after your procedure, you can use a quarter sized glob of this cream on top of your port. You do not need to rub it in. Cover with plastic wrap and come to the cancer center for your treatment. By the time you get here, the area will be numb so the nurses can use this area to give you your treatment.   Cyclophosphamide Injection What is this medicine? CYCLOPHOSPHAMIDE (sye kloe FOSS fa mide) is a chemotherapy drug. It slows the growth of cancer cells. This medicine is used to treat many types of cancer like lymphoma, myeloma, leukemia, breast cancer, and ovarian cancer, to name a few. This medicine may be used for other purposes; ask your health care provider or pharmacist if you have questions. COMMON BRAND NAME(S): Cytoxan, Neosar What should I tell my health care provider before I take this medicine? They need to know if you have any of these conditions:  heart disease  history of irregular heartbeat  infection  kidney disease  liver disease  low blood counts, like white cells, platelets, or red blood cells  on  hemodialysis  recent or ongoing radiation therapy  scarring or thickening of the lungs  trouble passing urine  an unusual or allergic reaction to cyclophosphamide, other medicines, foods, dyes, or preservatives  pregnant or trying to get pregnant  breast-feeding How should I use this medicine? This drug is usually given as an injection into a vein or muscle or by infusion into a vein. It is administered in a hospital or clinic by a specially trained health care professional. Talk to your pediatrician regarding the use of this medicine in children. Special care may be needed. Overdosage: If you think you have taken too much of this medicine contact a poison control center or emergency room at once. NOTE: This medicine is only for you. Do not share this medicine with others. What if I miss a dose? It is important not to miss your dose. Call your doctor or health care professional if you are unable to keep an appointment. What may interact with this medicine?  amphotericin B  azathioprine  certain antivirals for HIV or hepatitis  certain medicines for blood pressure, heart disease, irregular heart beat  certain medicines that treat or prevent blood clots like warfarin  certain other medicines for cancer  cyclosporine  etanercept  indomethacin  medicines that relax muscles for surgery  medicines to increase blood counts  metronidazole This list may not describe all possible interactions. Give your health care provider a list of all the medicines, herbs, non-prescription  drugs, or dietary supplements you use. Also tell them if you smoke, drink alcohol, or use illegal drugs. Some items may interact with your medicine. What should I watch for while using this medicine? Your condition will be monitored carefully while you are receiving this medicine. You may need blood work done while you are taking this medicine. Drink water or other fluids as directed. Urinate often, even  at night. Some products may contain alcohol. Ask your health care professional if this medicine contains alcohol. Be sure to tell all health care professionals you are taking this medicine. Certain medicines, like metronidazole and disulfiram, can cause an unpleasant reaction when taken with alcohol. The reaction includes flushing, headache, nausea, vomiting, sweating, and increased thirst. The reaction can last from 30 minutes to several hours. Do not become pregnant while taking this medicine or for 1 year after stopping it. Women should inform their health care professional if they wish to become pregnant or think they might be pregnant. Men should not father a child while taking this medicine and for 4 months after stopping it. There is potential for serious side effects to an unborn child. Talk to your health care professional for more information. Do not breast-feed an infant while taking this medicine or for 1 week after stopping it. This medicine has caused ovarian failure in some women. This medicine may make it more difficult to get pregnant. Talk to your health care professional if you are concerned about your fertility. This medicine has caused decreased sperm counts in some men. This may make it more difficult to father a child. Talk to your health care professional if you are concerned about your fertility. Call your health care professional for advice if you get a fever, chills, or sore throat, or other symptoms of a cold or flu. Do not treat yourself. This medicine decreases your body's ability to fight infections. Try to avoid being around people who are sick. Avoid taking medicines that contain aspirin, acetaminophen, ibuprofen, naproxen, or ketoprofen unless instructed by your health care professional. These medicines may hide a fever. Talk to your health care professional about your risk of cancer. You may be more at risk for certain types of cancer if you take this medicine. If you are  going to need surgery or other procedure, tell your health care professional that you are using this medicine. Be careful brushing or flossing your teeth or using a toothpick because you may get an infection or bleed more easily. If you have any dental work done, tell your dentist you are receiving this medicine. What side effects may I notice from receiving this medicine? Side effects that you should report to your doctor or health care professional as soon as possible:  allergic reactions like skin rash, itching or hives, swelling of the face, lips, or tongue  breathing problems  nausea, vomiting  signs and symptoms of bleeding such as bloody or black, tarry stools; red or dark brown urine; spitting up blood or brown material that looks like coffee grounds; red spots on the skin; unusual bruising or bleeding from the eyes, gums, or nose  signs and symptoms of heart failure like fast, irregular heartbeat, sudden weight gain; swelling of the ankles, feet, hands  signs and symptoms of infection like fever; chills; cough; sore throat; pain or trouble passing urine  signs and symptoms of kidney injury like trouble passing urine or change in the amount of urine  signs and symptoms of liver injury like dark yellow  or brown urine; general ill feeling or flu-like symptoms; light-colored stools; loss of appetite; nausea; right upper belly pain; unusually weak or tired; yellowing of the eyes or skin Side effects that usually do not require medical attention (report to your doctor or health care professional if they continue or are bothersome):  confusion  decreased hearing  diarrhea  facial flushing  hair loss  headache  loss of appetite  missed menstrual periods  signs and symptoms of low red blood cells or anemia such as unusually weak or tired; feeling faint or lightheaded; falls  skin discoloration This list may not describe all possible side effects. Call your doctor for medical  advice about side effects. You may report side effects to FDA at 1-800-FDA-1088. Where should I keep my medicine? This drug is given in a hospital or clinic and will not be stored at home. NOTE: This sheet is a summary. It may not cover all possible information. If you have questions about this medicine, talk to your doctor, pharmacist, or health care provider.  2020 Elsevier/Gold Standard (2018-11-15 09:53:29) Carfilzomib injection What is this medicine? CARFILZOMIB (kar FILZ oh mib) targets a specific protein within cancer cells and stops the cancer cells from growing. It is used to treat multiple myeloma. This medicine may be used for other purposes; ask your health care provider or pharmacist if you have questions. COMMON BRAND NAME(S): KYPROLIS What should I tell my health care provider before I take this medicine? They need to know if you have any of these conditions:  heart disease  history of blood clots  irregular heartbeat  kidney disease  liver disease  lung or breathing disease  an unusual or allergic reaction to carfilzomib, or other medicines, foods, dyes, or preservatives  pregnant or trying to get pregnant  breast-feeding How should I use this medicine? This medicine is for injection or infusion into a vein. It is given by a health care professional in a hospital or clinic setting. Talk to your pediatrician regarding the use of this medicine in children. Special care may be needed. Overdosage: If you think you have taken too much of this medicine contact a poison control center or emergency room at once. NOTE: This medicine is only for you. Do not share this medicine with others. What if I miss a dose? It is important not to miss your dose. Call your doctor or health care professional if you are unable to keep an appointment. What may interact with this medicine? Interactions are not expected. Give your health care provider a list of all the medicines, herbs,  non-prescription drugs, or dietary supplements you use. Also tell them if you smoke, drink alcohol, or use illegal drugs. Some items may interact with your medicine. This list may not describe all possible interactions. Give your health care provider a list of all the medicines, herbs, non-prescription drugs, or dietary supplements you use. Also tell them if you smoke, drink alcohol, or use illegal drugs. Some items may interact with your medicine. What should I watch for while using this medicine? Your condition will be monitored carefully while you are receiving this medicine. Report any side effects. Continue your course of treatment even though you feel ill unless your doctor tells you to stop. You may need blood work done while you are taking this medicine. Do not become pregnant while taking this medicine or for at least 6 months after stopping it. Women should inform their doctor if they wish to become pregnant or  think they might be pregnant. There is a potential for serious side effects to an unborn child. Men should not father a child while taking this medicine and for at least 3 months after stopping it. Talk to your health care professional or pharmacist for more information. Do not breast-feed an infant while taking this medicine or for 2 weeks after the last dose. Check with your doctor or health care professional if you get an attack of severe diarrhea, nausea and vomiting, or if you sweat a lot. The loss of too much body fluid can make it dangerous for you to take this medicine. You may get dizzy. Do not drive, use machinery, or do anything that needs mental alertness until you know how this medicine affects you. Do not stand or sit up quickly, especially if you are an older patient. This reduces the risk of dizzy or fainting spells. What side effects may I notice from receiving this medicine? Side effects that you should report to your doctor or health care professional as soon as  possible:  allergic reactions like skin rash, itching or hives, swelling of the face, lips, or tongue  confusion  dizziness  feeling faint or lightheaded  fever or chills  palpitations  seizures  signs and symptoms of bleeding such as bloody or black, tarry stools; red or dark-brown urine; spitting up blood or brown material that looks like coffee grounds; red spots on the skin; unusual bruising or bleeding including from the eye, gums, or nose  signs and symptoms of a blood clot such as breathing problems; changes in vision; chest pain; severe, sudden headache; pain, swelling, warmth in the leg; trouble speaking; sudden numbness or weakness of the face, arm or leg  signs and symptoms of kidney injury like trouble passing urine or change in the amount of urine  signs and symptoms of liver injury like dark yellow or brown urine; general ill feeling or flu-like symptoms; light-colored stools; loss of appetite; nausea; right upper belly pain; unusually weak or tired; yellowing of the eyes or skin Side effects that usually do not require medical attention (report to your doctor or health care professional if they continue or are bothersome):  back pain  cough  diarrhea  headache  muscle cramps  trouble sleeping  vomiting This list may not describe all possible side effects. Call your doctor for medical advice about side effects. You may report side effects to FDA at 1-800-FDA-1088. Where should I keep my medicine? This drug is given in a hospital or clinic and will not be stored at home. NOTE: This sheet is a summary. It may not cover all possible information. If you have questions about this medicine, talk to your doctor, pharmacist, or health care provider.  2020 Elsevier/Gold Standard (2018-10-18 19:44:21)

## 2019-05-05 NOTE — Telephone Encounter (Signed)
Spoke with son, relayed instructions for PCA appt.  Patient and son were told to stop Coumadin permanently since it was preventive because of the type of treatment she was on previous and that was discontinued.

## 2019-05-09 ENCOUNTER — Telehealth: Payer: Self-pay | Admitting: Medical Oncology

## 2019-05-09 ENCOUNTER — Other Ambulatory Visit: Payer: Self-pay | Admitting: Radiology

## 2019-05-09 NOTE — Telephone Encounter (Signed)
Requests instructions for port procedure tomorrow. I called IR and asked staff to call son. They will call him.

## 2019-05-10 ENCOUNTER — Encounter (HOSPITAL_COMMUNITY): Payer: Self-pay

## 2019-05-10 ENCOUNTER — Ambulatory Visit (HOSPITAL_COMMUNITY)
Admission: RE | Admit: 2019-05-10 | Discharge: 2019-05-10 | Disposition: A | Discharge status: Home / Self Care | Payer: Self-pay | Source: Ambulatory Visit | Attending: Physician Assistant | Admitting: Physician Assistant

## 2019-05-10 ENCOUNTER — Other Ambulatory Visit: Payer: Self-pay

## 2019-05-10 ENCOUNTER — Other Ambulatory Visit: Payer: Self-pay | Admitting: Physician Assistant

## 2019-05-10 DIAGNOSIS — Z79899 Other long term (current) drug therapy: Secondary | ICD-10-CM | POA: Insufficient documentation

## 2019-05-10 DIAGNOSIS — C9000 Multiple myeloma not having achieved remission: Secondary | ICD-10-CM | POA: Insufficient documentation

## 2019-05-10 DIAGNOSIS — Z7952 Long term (current) use of systemic steroids: Secondary | ICD-10-CM | POA: Insufficient documentation

## 2019-05-10 HISTORY — PX: IR IMAGING GUIDED PORT INSERTION: IMG5740

## 2019-05-10 MED ORDER — LIDOCAINE HCL 1 % IJ SOLN
INTRAMUSCULAR | Status: AC
  Filled 2019-05-10: qty 20

## 2019-05-10 MED ORDER — MIDAZOLAM HCL 2 MG/2ML IJ SOLN
INTRAMUSCULAR | Status: AC | PRN
  Administered 2019-05-10 (×4): 1 mg via INTRAVENOUS

## 2019-05-10 MED ORDER — HEPARIN SOD (PORK) LOCK FLUSH 100 UNIT/ML IV SOLN
INTRAVENOUS | Status: AC | PRN
  Administered 2019-05-10: 500 [IU] via INTRAVENOUS

## 2019-05-10 MED ORDER — FENTANYL CITRATE (PF) 100 MCG/2ML IJ SOLN
INTRAMUSCULAR | Status: AC
  Filled 2019-05-10: qty 2

## 2019-05-10 MED ORDER — SODIUM CHLORIDE 0.9 % IV SOLN: INTRAVENOUS | Status: DC

## 2019-05-10 MED ORDER — HEPARIN SOD (PORK) LOCK FLUSH 100 UNIT/ML IV SOLN
INTRAVENOUS | Status: AC
  Filled 2019-05-10: qty 5

## 2019-05-10 MED ORDER — LIDOCAINE-EPINEPHRINE (PF) 1 %-1:200000 IJ SOLN
INTRAMUSCULAR | Status: AC | PRN
  Administered 2019-05-10: 10 mL

## 2019-05-10 MED ORDER — LIDOCAINE HCL (PF) 1 % IJ SOLN
INTRAMUSCULAR | Status: AC | PRN
  Administered 2019-05-10: 5 mL

## 2019-05-10 MED ORDER — FENTANYL CITRATE (PF) 100 MCG/2ML IJ SOLN
INTRAMUSCULAR | Status: AC | PRN
  Administered 2019-05-10 (×2): 50 ug via INTRAVENOUS

## 2019-05-10 MED ORDER — LIDOCAINE-EPINEPHRINE 1 %-1:100000 IJ SOLN
INTRAMUSCULAR | Status: AC
  Filled 2019-05-10: qty 1

## 2019-05-10 MED ORDER — MIDAZOLAM HCL 2 MG/2ML IJ SOLN
INTRAMUSCULAR | Status: AC
  Filled 2019-05-10: qty 4

## 2019-05-10 MED ORDER — CEFAZOLIN SODIUM-DEXTROSE 2-4 GM/100ML-% IV SOLN: 2.0000 g | INTRAVENOUS | Status: DC

## 2019-05-10 MED ORDER — CEFAZOLIN SODIUM-DEXTROSE 2-4 GM/100ML-% IV SOLN
INTRAVENOUS | Status: AC
  Filled 2019-05-10: qty 100

## 2019-05-10 NOTE — Procedures (Signed)
Interventional Radiology Procedure:   Indications: Multiple myeloma  Procedure: Port placement  Findings: Right jugular port, tip at SVC/RA junction  Complications: None     EBL: Minimal, less than 10 ml  Plan: Discharge in one hour.  Keep port site and incisions dry for at least 24 hours.     Mahreen Schewe R. Klayten Jolliff, MD  Pager: [phone]   

## 2019-05-10 NOTE — H&P (Signed)
Referring Physician(s): Heilingoetter,Cassandra L/Mohamed,M  Supervising Physician: Markus Daft  Patient Status:  [facility]  Chief Complaint:  "I'm here for a port a cath"  Subjective: Patient familiar to IR service from bone marrow biopsy in 2019.  She has a history of multiple myeloma diagnosed in 2019 and now has evidence of disease progression.  She has poor venous access and presents today for Port-A-Cath placement for chemotherapy.  She currently denies fever, headache, chest pain, dyspnea, cough, abdominal/back pain, nausea, vomiting or bleeding.  She does have some occasional myalgias as well as peripheral neuropathy.  Past Medical History:  Diagnosis Date  . Cancer ([place])    No birth history on file.   Allergies: Patient has no known allergies.  Medications: Prior to Admission medications   Medication Sig Start Date End Date Taking? Authorizing Provider  acyclovir (ZOVIRAX) 200 MG capsule Take 1 capsule (200 mg total) by mouth 2 (two) times daily. 05/05/19   Heilingoetter, Cassandra L, PA-C  blood glucose meter kit and supplies Dispense based on patient and insurance preference. Use up to four times daily as directed. (FOR ICD-10 E10.9, E11.9). 09/28/17   Arrien, Jimmy Picket, MD  dexamethasone (DECADRON) 4 MG tablet Take 10 tablets (40 mg total) by mouth every 7 (seven) days. 05/05/19   Heilingoetter, Cassandra L, PA-C  diphenhydrAMINE (BENADRYL ALLERGY) 25 MG tablet Take 1 tablet (25 mg total) by mouth every 6 (six) hours as needed for itching. Patient not taking: Reported on 04/14/2019 10/23/17   Sandi Mealy E., PA-C  furosemide (LASIX) 20 MG tablet Take once daily for 3 days for lower extremity edema, then stop. Repeat as needed for recurrent edema. Patient not taking: Reported on 03/17/2019 10/15/17   Harle Stanford., PA-C  gabapentin (NEURONTIN) 100 MG capsule Take 1 capsule (100 mg total) by mouth 3 (three) times daily. Patient not taking: Reported on 04/14/2019 12/23/18    Heilingoetter, Cassandra L, PA-C  lidocaine-prilocaine (EMLA) cream Apply 1 application topically as needed. 05/05/19   Heilingoetter, Cassandra L, PA-C  methylPREDNISolone (MEDROL DOSEPAK) 4 MG TBPK tablet Use as instructed 05/05/19   Heilingoetter, Cassandra L, PA-C  metoprolol tartrate (LOPRESSOR) 25 MG tablet Take 0.5 tablets (12.5 mg total) by mouth 2 (two) times daily. 03/31/19 05/05/19  Curt Bears, MD  omeprazole (PRILOSEC) 40 MG capsule Take 1 capsule (40 mg total) by mouth daily. 03/31/19   Curt Bears, MD  oxyCODONE-acetaminophen (PERCOCET) 5-325 MG tablet Take 2 tablets by mouth every 6 (six) hours as needed. Patient not taking: Reported on 04/14/2019 02/17/19   Milton Ferguson, MD  polyethylene glycol [facility] / Floria Raveling) packet Take 17 g by mouth daily. Patient not taking: Reported on 04/14/2019 10/15/17   Harle Stanford., PA-C  prochlorperazine (COMPAZINE) 10 MG tablet Take 1 tablet (10 mg total) by mouth every 6 (six) hours as needed for nausea or vomiting. Patient not taking: Reported on 03/17/2019 05/13/18   Heilingoetter, Cassandra L, PA-C  RELION GLUCOSE TEST STRIPS test strip USE 1 TO CHECK GLUCOSE ONCE DAILY 03/06/18   [provider]  ReliOn Ultra Thin Lancets 30G MISC USE 1 TO CHECK GLUCOSE ONCE DAILY FOR GLUCOSE TESTING 03/06/18   [provider]  traMADol (ULTRAM) 50 MG tablet Take 50 mg by mouth every 8 (eight) hours as needed. 03/03/19   [provider]  traZODone (DESYREL) 50 MG tablet Take 1 tablet (50 mg total) by mouth at bedtime. Patient not taking: Reported on 04/14/2019 10/23/17   Harle Stanford.,  PA-C     Vital Signs: Blood pressure 116/46, temperature 99.2, heart rate 93, respirations 17, O2 sat 100% room air   Physical Exam awake, alert.  Chest clear to auscultation bilaterally.  Heart with regular rate and rhythm.  Abdomen soft, positive bowel sounds, nontender.  Bilateral pretibial edema noted.  Imaging: No results  found.  Labs:  CBC: Recent Labs    03/31/19 1523 04/07/19 0800 04/14/19 1259 05/05/19 0804  WBC 5.5 5.1 5.4 8.7  HGB 13.5 13.6 13.2 11.3*  HCT 40.2 40.7 38.8 32.6*  PLT 111* 109* 124* 83*    COAGS: No results for input(s): INR, APTT in the last 8760 hours.  BMP: Recent Labs    03/31/19 1523 04/07/19 0800 04/14/19 1259 05/05/19 0804  NA 145 141 139 134*  K 5.4* 4.2 4.6 4.6  CL 107 103 105 98  CO2 '25 28 25 25  ' GLUCOSE 151* 93 107* 146*  BUN '17 16 16 15  ' CALCIUM 9.6 9.3 8.9 8.0*  CREATININE 1.28* 1.26* 1.25* 1.53*  GFRNONAA 43* 44* 44* 35*  GFRAA 50* 51* 51* 40*    LIVER FUNCTION TESTS: Recent Labs    03/31/19 1523 04/07/19 0800 04/14/19 1259 05/05/19 0804  BILITOT 0.3 0.3 0.4 0.9  AST 15 15 13* 16  ALT '12 11 9 16  ' ALKPHOS 165* 148* 124 67  PROT 8.0 8.0 7.7 6.1*  ALBUMIN 3.9 3.8 3.7 2.8*    Assessment and Plan: Pt with history of multiple myeloma diagnosed in 2019 ; now with evidence of disease progression.  She has poor venous access and presents today for Port-A-Cath placement for chemotherapy.Risks and benefits of image guided port-a-catheter placement was discussed with the patient including, but not limited to bleeding, infection, pneumothorax, or fibrin sheath development and need for additional procedures.  All of the patient's questions were answered, patient is agreeable to proceed. Consent signed and in chart.     Electronically Signed: D. Rowe Robert, PA-C 05/10/2019, 12:34 PM   I spent a total of 25 minutes at the the patient's bedside AND on the patient's hospital floor or unit, greater than 50% of which was counseling/coordinating care for Port-A-Cath placement

## 2019-05-10 NOTE — Discharge Instructions (Signed)
DO NOT USE EMLA CREAM (LIDOCAINE CREAM) OVER YOUR NEW PORT UNTIL IT HAS HEALED. The petroleum in the cream will dissolve the skin glue over your new port. The skin will come apart and result in an infection of your new port. Use ice in a zip lock bag over your new port until it has healed.     Implanted Port Insertion, Care After This sheet gives you information about how to care for yourself after your procedure. Your health care provider may also give you more specific instructions. If you have problems or questions, contact your health care provider. What can I expect after the procedure? After the procedure, it is common to have:  Discomfort at the port insertion site.  Bruising on the skin over the port. This should improve over 3-4 days. Follow these instructions at home: [facility] care  After your port is placed, you will get a manufacturer's information card. The card has information about your port. Keep this card with you at all times.  Take care of the port as told by your health care provider.Make sure to remember what type of port you have. Incision care      Follow instructions from your health care provider about how to take care of your port insertion site. Make sure you: ? Wash your hands with soap and water before and after you change your bandage (dressing). If soap and water are not available, use hand sanitizer.  Change your dressing as told by your health care provider. You may remove clear dressing and gauze dressing over new port tomorrow before you shower.  Leave  skin glue in place. These skin closures may need to stay in place for 2 weeks or longer.  Check your port insertion site every day for signs of infection. Check for: ? Redness, swelling, or pain. ? Fluid or blood. ? Warmth. ? Pus or a bad smell. Activity  Return to your normal activities as told by your health care provider. Ask your health care provider what activities are safe for you.  Do not  lift anything that is heavier than 10 lb (4.5 kg), or the limit that you are told, until your health care provider says that it is safe. General instructions  Take over-the-counter and prescription medicines only as told by your health care provider.  Do not take baths, swim, or use a hot tub until your health care provider approves. You may shower 05/11/2019 afternoon.  Do not drive for 24 hours if you were given a sedative during your procedure.  Wear a medical alert bracelet in case of an emergency. This will tell any health care providers that you have a port.  Keep all follow-up visits as told by your health care provider. This is important. Contact a health care provider if:  You cannot flush your port with saline as directed, or you cannot draw blood from the port.  You have a fever or chills.  You have redness, swelling, or pain around your port insertion site.  You have fluid or blood coming from your port insertion site.  Your port insertion site feels warm to the touch.  You have pus or a bad smell coming from the port insertion site. Get help right away if:  You have chest pain or shortness of breath.  You have bleeding from your port that you cannot control. Summary  Take care of the port as told by your health care provider. Keep the manufacturer's information card with you  at all times.  Change your dressing as told by your health care provider.  Contact a health care provider if you have a fever or chills or if you have redness, swelling, or pain around your port insertion site.  Keep all follow-up visits as told by your health care provider. This information is not intended to replace advice given to you by your health care provider. Make sure you discuss any questions you have with your health care provider. Document Revised: 09/08/2017 Document Reviewed: 09/08/2017 Elsevier Patient Education  [place].     Moderate Conscious Sedation, Adult,  Care After These instructions provide you with information about caring for yourself after your procedure. Your health care provider may also give you more specific instructions. Your treatment has been planned according to current medical practices, but problems sometimes occur. Call your health care provider if you have any problems or questions after your procedure. What can I expect after the procedure? After your procedure, it is common:  To feel sleepy for several hours.  To feel clumsy and have poor balance for several hours.  To have poor judgment for several hours.  To vomit if you eat too soon. Follow these instructions at home: For at least 24 hours after the procedure:   Do not: ? Participate in activities where you could fall or become injured. ? Drive. ? Use heavy machinery. ? Drink alcohol. ? Take sleeping pills or medicines that cause drowsiness. ? Make important decisions or sign legal documents. ? Take care of children on your own.  Rest. Eating and drinking  Follow the diet recommended by your health care provider.  If you vomit: ? Drink water, juice, or soup when you can drink without vomiting. ? Make sure you have little or no nausea before eating solid foods. General instructions  Have a responsible adult stay with you until you are awake and alert.  Take over-the-counter and prescription medicines only as told by your health care provider.  If you smoke, do not smoke without supervision.  Keep all follow-up visits as told by your health care provider. This is important. Contact a health care provider if:  You keep feeling nauseous or you keep vomiting.  You feel light-headed.  You develop a rash.  You have a fever. Get help right away if:  You have trouble breathing. This information is not intended to replace advice given to you by your health care provider. Make sure you discuss any questions you have with your health care  provider. Document Revised: 01/23/2017 Document Reviewed: 06/02/2015 Elsevier Patient Education  2020 [employer].

## 2019-05-11 NOTE — Progress Notes (Signed)
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  PRIOR THERAPY: 1)Systemic therapy with Velcade 1.3 mg/M2 weekly, Revlimid 25 mg p.o. daily for 14 days every 3 weeks in addition to weekly Decadron 40 mg orally. First dose of treatment 10/08/2017.Treatment was placed on hold due to development of a significant rash.Sheresumedtreatment withonlydexamethasone and Velcade on 11/19/2017.This was discontinued on 04/14/2019 due to evidence of disease progression. 2) Pomalyst 4 mg p.o. for 21 days every 4 weeks and 40 mg p.o. Decadron once a week. First dose 04/29/2019-05/04/2019.Status post 6 days of treatment. Discontinued secondary to allergy to Pomalyst.   CURRENT THERAPY: Chemotherapy with carfilzomib on days 1, 2, 8, 9, 15, and 16 and Cytoxan 300 mg/m2 on days 1, 8, and 15 IV every 4 weeks and 40 mg p.o. weekly of Decadron. First dose expected on 05/12/2019.   INTERVAL HISTORY: Debbie Chan 69 y.o. female returns to the clinic today for a follow-up visit.  The patient's treatment with Pomalyst was recently discontinued secondary to a allergic reaction to this medication. She was given a medrol dosepak which she states she completed yesterday. The erythema of her hands has resolved at this time. Her skin is peeling on her hands and forearms. The swelling has improved as well. She is scheduled to receive her first dose of carfilzomib  and Cytoxan today.  She recently had a Port-A-Cath placed and tolerated this procedure well without any adverse side effects.   Today, the patient is feeling fair without any concerning complaints.  She denies any recent fever, chills, night sweats, or weight loss.  She continues to experience peripheral neuropathy in her toes for which she is prescribed gabapentin.  Denies any nausea, vomiting, diarrhea, or constipation.  Denies any bleeding or  bruising.  She is here today for evaluation before starting cycle #1 of her new treatment.   MEDICAL HISTORY: Past Medical History:  Diagnosis Date  . Cancer ([place])     ALLERGIES:  has No Known Allergies.  MEDICATIONS:  Current Outpatient Medications  Medication Sig Dispense Refill  . acyclovir (ZOVIRAX) 200 MG capsule Take 1 capsule (200 mg total) by mouth 2 (two) times daily. 60 capsule 2  . blood glucose meter kit and supplies Dispense based on patient and insurance preference. Use up to four times daily as directed. (FOR ICD-10 E10.9, E11.9). 1 each 0  . dexamethasone (DECADRON) 4 MG tablet Take 10 tablets (40 mg total) by mouth every 7 (seven) days. 40 tablet 3  . diphenhydrAMINE (BENADRYL ALLERGY) 25 MG tablet Take 1 tablet (25 mg total) by mouth every 6 (six) hours as needed for itching. (Patient not taking: Reported on 04/14/2019) 100 tablet 0  . lidocaine-prilocaine (EMLA) cream Apply 1 application topically as needed. 30 g 2  . methylPREDNISolone (MEDROL DOSEPAK) 4 MG TBPK tablet Use as instructed 21 tablet 0  . metoprolol tartrate (LOPRESSOR) 25 MG tablet Take 0.5 tablets (12.5 mg total) by mouth 2 (two) times daily. 30 tablet 3  . omeprazole (PRILOSEC) 20 MG capsule Take 1 capsule (20 mg total) by mouth daily. 30 capsule 3  . oxyCODONE-acetaminophen (PERCOCET) 5-325 MG tablet Take 2 tablets by mouth every 6 (six) hours as needed. (Patient not taking: Reported on 04/14/2019) 20 tablet 0  . prochlorperazine (COMPAZINE) 10 MG tablet Take 1 tablet (10 mg total) by mouth every 6 (six) hours as needed for nausea or vomiting.  30 tablet 2  . RELION GLUCOSE TEST STRIPS test strip USE 1 TO CHECK GLUCOSE ONCE DAILY    . ReliOn Ultra Thin Lancets 30G MISC USE 1 TO CHECK GLUCOSE ONCE DAILY FOR GLUCOSE TESTING    . traMADol (ULTRAM) 50 MG tablet Take 50 mg by mouth every 8 (eight) hours as needed.     No current facility-administered medications for this visit.    SURGICAL HISTORY:  Past  Surgical History:  Procedure Laterality Date  . IR IMAGING GUIDED PORT INSERTION  05/10/2019    REVIEW OF SYSTEMS:   Review of Systems  Constitutional: Negative for appetite change, chills, fatigue, fever and unexpected weight change.  HENT:   Negative for mouth sores, nosebleeds, sore throat and trouble swallowing.   Eyes: Negative for eye problems and icterus.  Respiratory: Negative for cough, hemoptysis, shortness of breath and wheezing.  Cardiovascular: Positive for mild bilateral lower extremity swelling. Negative for chest pain. Gastrointestinal: Negative for abdominal pain, constipation, diarrhea, nausea and vomiting.  Genitourinary: Negative for bladder incontinence, difficulty urinating, dysuria, frequency and hematuria.   Musculoskeletal: Negative for back pain, gait problem, neck pain and neck stiffness.  Skin: Positive for skin peeling on hands and forearms bilaterally.  Neurological: Negative for dizziness, extremity weakness, gait problem, headaches, light-headedness and seizures.  Hematological: Negative for adenopathy. Does not bruise/bleed easily.  Psychiatric/Behavioral: Negative for confusion, depression and sleep disturbance. The patient is not nervous/anxious.     PHYSICAL EXAMINATION:  Blood pressure 125/60, pulse (!) 59, temperature 97.8 F (36.6 C), temperature source Tympanic, resp. rate 17, weight 207 lb 9 oz (94.1 kg), SpO2 100 %.  ECOG PERFORMANCE STATUS: 1 - Symptomatic but completely ambulatory  Physical Exam  Constitutional: Oriented to person, place, and time and well-developed, well-nourished, and in no distress.  HENT:  Head: Normocephalic and atraumatic.  Mouth/Throat: Oropharynx is clear and moist. No oropharyngeal exudate.  Eyes: Conjunctivae are normal. Right eye exhibits no discharge. Left eye exhibits no discharge. No scleral icterus.  Neck: Normal range of motion. Neck supple.  Cardiovascular: Normal rate, regular rhythm, normal heart sounds  and intact distal pulses.   Pulmonary/Chest: Effort normal and breath sounds normal. No respiratory distress. No wheezes. No rales.  Abdominal: Soft. Bowel sounds are normal. Exhibits no distension and no mass. There is no tenderness.  Musculoskeletal: Normal range of motion. Exhibits no edema.  Lymphadenopathy:    No cervical adenopathy.  Neurological: Alert and oriented to person, place, and time. Exhibits normal muscle tone. Gait normal. Coordination normal.  Skin: Skin peeling on her hands and forearms. Erythema resolved. Swelling of both hands improved. Psychiatric: Mood, memory and judgment normal.  Vitals reviewed.  LABORATORY DATA: Lab Results  Component Value Date   WBC 8.2 05/12/2019   HGB 11.6 (L) 05/12/2019   HCT 35.0 (L) 05/12/2019   MCV 90.0 05/12/2019   PLT 239 05/12/2019      Chemistry      Component Value Date/Time   NA 139 05/12/2019 0854   K 4.4 05/12/2019 0854   CL 107 05/12/2019 0854   CO2 24 05/12/2019 0854   BUN 15 05/12/2019 0854   CREATININE 1.18 (H) 05/12/2019 0854      Component Value Date/Time   CALCIUM 8.2 (L) 05/12/2019 0854   ALKPHOS 80 05/12/2019 0854   AST 14 (L) 05/12/2019 0854   ALT 11 05/12/2019 0854   BILITOT 0.4 05/12/2019 0854       RADIOGRAPHIC STUDIES:  IR IMAGING GUIDED PORT INSERTION  Result Date: 05/10/2019 INDICATION: Multiple myeloma without remission. Port-A-Cath needed for treatment. EXAM: FLUOROSCOPIC AND ULTRASOUND GUIDED PLACEMENT OF A SUBCUTANEOUS PORT COMPARISON:  None. MEDICATIONS: Ancef 2 g; The antibiotic was administered within an appropriate time interval prior to skin puncture. ANESTHESIA/SEDATION: Versed 4.0 mg IV; Fentanyl 100 mcg IV; Moderate Sedation Time:  32 minutes The patient was continuously monitored during the procedure by the interventional radiology nurse under my direct supervision. FLUOROSCOPY TIME:  18 seconds, 3 mGy COMPLICATIONS: None immediate. PROCEDURE: The procedure, risks, benefits, and  alternatives were explained to the patient. Questions regarding the procedure were encouraged and answered. The patient understands and consents to the procedure. Patient was placed supine on the interventional table. Ultrasound confirmed a patent right internal jugular vein. Ultrasound image was saved for documentation. The right chest and neck were cleaned with a skin antiseptic and a sterile drape was placed. Maximal barrier sterile technique was utilized including caps, mask, sterile gowns, sterile gloves, sterile drape, hand hygiene and skin antiseptic. The right neck was anesthetized with 1% lidocaine. Small incision was made in the right neck with a blade. Micropuncture set was placed in the right internal jugular vein with ultrasound guidance. The micropuncture wire was used for measurement purposes. The right chest was anesthetized with 1% lidocaine with epinephrine. #15 blade was used to make an incision and a subcutaneous port pocket was formed. [place] was assembled. Subcutaneous tunnel was formed with a stiff tunneling device. The port catheter was brought through the subcutaneous tunnel. The port was placed in the subcutaneous pocket and sutured in place. The micropuncture set was exchanged for a peel-away sheath. The catheter was placed through the peel-away sheath and the tip was positioned at the SVC and right atrium junction. Catheter placement was confirmed with fluoroscopy. The port was accessed and flushed with heparinized saline. The port pocket was closed using two layers of absorbable sutures and Dermabond. The vein skin site was closed using a single layer of absorbable suture and Dermabond. Sterile dressings were applied. Patient tolerated the procedure well without an immediate complication. Ultrasound and fluoroscopic images were taken and saved for this procedure. IMPRESSION: Placement of a subcutaneous port device. Electronically Signed   By: Markus Daft M.D.   On: 05/10/2019  15:06     ASSESSMENT/PLAN:  This is a very pleasant 69 years old African female originally from [place] who is visiting her son in [place] and was diagnosed with IgA multiple myeloma.  The patient was a started initially on treatment with weekly subcutaneous Velcade 1.3 mg/M2, Revlimid 25 mg p.o. daily for 21 days every 4 weeks as well as weekly Decadron 40 mg orally. She was tolerating the treatment well but she developed significant skin rash secondary to treatment with Revlimid and this was discontinued.  The patient continued treatment with subcutaneous Velcade and Decadron.  She developed evidence of disease progression in February 2021.  Her treatment was discontinued.  The patient was then started on Pomalyst 4 mg p.o. daily for 21 days on every 4 weeks with weekly Decadron 40 mg p.o.  She is status post 6 days of treatment.  She developed a significant skin rash/reaction to Pomalyst.  This was discontinued.  The patient is currently undergoing treatment with Carfilzomib on days 1, 2, 8, 9, 15, and 16 and Cytoxan on days 1, 8, and 15 IV every 4 weeks with 40 mg p.o. weekly of Decadron. She is expected to receive her first dose today.  Labs were reviewed.  Recommend that she proceed with cycle #1 today scheduled.  We will see her back for follow-up visit in 3 weeks for evaluation before starting cycle #2.   I sent a refill of compazine and omeprazole to her pharmacy. She requested a refill of several medications such as metoprolol but it appears she has refills at her pharmacy. Instructed to contact the pharmacy for refills.   The patient was advised to call immediately if she has any concerning symptoms in the interval. The patient voices understanding of current disease status and treatment options and is in agreement with the current care plan. All questions were answered. The patient knows to call the clinic with any problems, questions or concerns. We can  certainly see the patient much sooner if necessary        No orders of the defined types were placed in this encounter.    Debbie Chan L Janette Harvie, PA-C 05/12/19

## 2019-05-12 ENCOUNTER — Inpatient Hospital Stay: Payer: Self-pay

## 2019-05-12 ENCOUNTER — Other Ambulatory Visit: Payer: Self-pay

## 2019-05-12 ENCOUNTER — Encounter: Payer: Self-pay | Admitting: Physician Assistant

## 2019-05-12 ENCOUNTER — Telehealth: Payer: Self-pay

## 2019-05-12 ENCOUNTER — Ambulatory Visit: Payer: Self-pay | Admitting: Internal Medicine

## 2019-05-12 ENCOUNTER — Ambulatory Visit: Payer: Self-pay

## 2019-05-12 ENCOUNTER — Inpatient Hospital Stay (HOSPITAL_BASED_OUTPATIENT_CLINIC_OR_DEPARTMENT_OTHER): Payer: Self-pay | Admitting: Physician Assistant

## 2019-05-12 VITALS — BP 125/60 | HR 59 | Temp 97.8°F | Resp 17 | Wt 207.6 lb

## 2019-05-12 DIAGNOSIS — I10 Essential (primary) hypertension: Secondary | ICD-10-CM

## 2019-05-12 DIAGNOSIS — C9000 Multiple myeloma not having achieved remission: Secondary | ICD-10-CM

## 2019-05-12 DIAGNOSIS — Z5111 Encounter for antineoplastic chemotherapy: Secondary | ICD-10-CM

## 2019-05-12 DIAGNOSIS — K219 Gastro-esophageal reflux disease without esophagitis: Secondary | ICD-10-CM

## 2019-05-12 DIAGNOSIS — I159 Secondary hypertension, unspecified: Secondary | ICD-10-CM

## 2019-05-12 LAB — CBC WITH DIFFERENTIAL (CANCER CENTER ONLY)
Abs Immature Granulocytes: 0.06 10*3/uL (ref 0.00–0.07)
Basophils Absolute: 0 10*3/uL (ref 0.0–0.1)
Basophils Relative: 1 %
Eosinophils Absolute: 0.3 10*3/uL (ref 0.0–0.5)
Eosinophils Relative: 3 %
HCT: 35 % — ABNORMAL LOW (ref 36.0–46.0)
Hemoglobin: 11.6 g/dL — ABNORMAL LOW (ref 12.0–15.0)
Immature Granulocytes: 1 %
Lymphocytes Relative: 14 %
Lymphs Abs: 1.2 10*3/uL (ref 0.7–4.0)
MCH: 29.8 pg (ref 26.0–34.0)
MCHC: 33.1 g/dL (ref 30.0–36.0)
MCV: 90 fL (ref 80.0–100.0)
Monocytes Absolute: 0.7 10*3/uL (ref 0.1–1.0)
Monocytes Relative: 9 %
Neutro Abs: 5.9 10*3/uL (ref 1.7–7.7)
Neutrophils Relative %: 72 %
Platelet Count: 239 10*3/uL (ref 150–400)
RBC: 3.89 MIL/uL (ref 3.87–5.11)
RDW: 14.5 % (ref 11.5–15.5)
WBC Count: 8.2 10*3/uL (ref 4.0–10.5)
nRBC: 0 % (ref 0.0–0.2)

## 2019-05-12 LAB — CMP (CANCER CENTER ONLY)
ALT: 11 U/L (ref 0–44)
AST: 14 U/L — ABNORMAL LOW (ref 15–41)
Albumin: 3.1 g/dL — ABNORMAL LOW (ref 3.5–5.0)
Alkaline Phosphatase: 80 U/L (ref 38–126)
Anion gap: 8 (ref 5–15)
BUN: 15 mg/dL (ref 8–23)
CO2: 24 mmol/L (ref 22–32)
Calcium: 8.2 mg/dL — ABNORMAL LOW (ref 8.9–10.3)
Chloride: 107 mmol/L (ref 98–111)
Creatinine: 1.18 mg/dL — ABNORMAL HIGH (ref 0.44–1.00)
GFR, Est AFR Am: 55 mL/min — ABNORMAL LOW (ref >60)
GFR, Estimated: 47 mL/min — ABNORMAL LOW (ref >60)
Glucose, Bld: 112 mg/dL — ABNORMAL HIGH (ref 70–99)
Potassium: 4.4 mmol/L (ref 3.5–5.1)
Sodium: 139 mmol/L (ref 135–145)
Total Bilirubin: 0.4 mg/dL (ref 0.3–1.2)
Total Protein: 6.4 g/dL — ABNORMAL LOW (ref 6.5–8.1)

## 2019-05-12 MED ORDER — SODIUM CHLORIDE 0.9% FLUSH
10.0000 mL | INTRAVENOUS | Status: DC | PRN
  Administered 2019-05-12: 10 mL
  Filled 2019-05-12: qty 10

## 2019-05-12 MED ORDER — SODIUM CHLORIDE 0.9 % IV SOLN
Freq: Once | INTRAVENOUS | Status: AC
  Filled 2019-05-12: qty 250

## 2019-05-12 MED ORDER — OMEPRAZOLE 20 MG PO CPDR: 20.0000 mg | DELAYED_RELEASE_CAPSULE | Freq: Every day | ORAL | 3 refills | Status: DC

## 2019-05-12 MED ORDER — PROCHLORPERAZINE MALEATE 10 MG PO TABS: 10.0000 mg | ORAL_TABLET | Freq: Four times a day (QID) | ORAL | 2 refills | Status: DC | PRN

## 2019-05-12 MED ORDER — PALONOSETRON HCL INJECTION 0.25 MG/5ML
0.2500 mg | Freq: Once | INTRAVENOUS | Status: AC
  Administered 2019-05-12: 0.25 mg via INTRAVENOUS

## 2019-05-12 MED ORDER — DEXTROSE 5 % IV SOLN
40.0000 mg | Freq: Once | INTRAVENOUS | Status: AC
  Administered 2019-05-12: 40 mg via INTRAVENOUS
  Filled 2019-05-12: qty 15

## 2019-05-12 MED ORDER — SODIUM CHLORIDE 0.9 % IV SOLN
Freq: Once | INTRAVENOUS | Status: DC
  Filled 2019-05-12: qty 250

## 2019-05-12 MED ORDER — PALONOSETRON HCL INJECTION 0.25 MG/5ML
INTRAVENOUS | Status: AC
  Filled 2019-05-12: qty 5

## 2019-05-12 MED ORDER — HEPARIN SOD (PORK) LOCK FLUSH 100 UNIT/ML IV SOLN
500.0000 [IU] | Freq: Once | INTRAVENOUS | Status: AC | PRN
  Administered 2019-05-12: 500 [IU]
  Filled 2019-05-12: qty 5

## 2019-05-12 MED ORDER — SODIUM CHLORIDE 0.9 % IV SOLN
300.0000 mg/m2 | Freq: Once | INTRAVENOUS | Status: AC
  Administered 2019-05-12: 640 mg via INTRAVENOUS
  Filled 2019-05-12: qty 32

## 2019-05-12 MED FILL — PROCHLORPERAZINE 10 MG TAB: 10 | 7 days supply | Qty: 30 | Fill #0

## 2019-05-12 MED FILL — METOPROLOL TARTRATE 25 MG T: 25 | 30 days supply | Qty: 30 | Fill #1

## 2019-05-12 MED FILL — OMEPRAZOLE 20 MG CAP: 20 | 30 days supply | Qty: 30 | Fill #0

## 2019-05-12 NOTE — Patient Instructions (Signed)
[facility] Discharge Instructions for Patients Receiving Chemotherapy  Today you received the following chemotherapy agents Kyprolis and Cytoxan.  To help prevent nausea and vomiting after your treatment, we encourage you to take your nausea medication as directed BUT NO ZOFRAN FOR 3 DAYS AFTER CHEMO - take compazine instead.   If you develop nausea and vomiting that is not controlled by your nausea medication, call the clinic.   BELOW ARE SYMPTOMS THAT SHOULD BE REPORTED IMMEDIATELY:  *FEVER GREATER THAN 100.5 F  *CHILLS WITH OR WITHOUT FEVER  NAUSEA AND VOMITING THAT IS NOT CONTROLLED WITH YOUR NAUSEA MEDICATION  *UNUSUAL SHORTNESS OF BREATH  *UNUSUAL BRUISING OR BLEEDING  TENDERNESS IN MOUTH AND THROAT WITH OR WITHOUT PRESENCE OF ULCERS  *URINARY PROBLEMS  *BOWEL PROBLEMS  UNUSUAL RASH Items with * indicate a potential emergency and should be followed up as soon as possible.  Feel free to call the clinic you have any questions or concerns. The clinic phone number is [phone].  Please show the [place] at check-in to the Emergency Department and triage nurse.   Cyclophosphamide Injection What is this medicine? CYCLOPHOSPHAMIDE (sye kloe FOSS fa mide) is a chemotherapy drug. It slows the growth of cancer cells. This medicine is used to treat many types of cancer like lymphoma, myeloma, leukemia, breast cancer, and ovarian cancer, to name a few. This medicine may be used for other purposes; ask your health care provider or pharmacist if you have questions. COMMON BRAND NAME(S): Cytoxan, Neosar What should I tell my health care provider before I take this medicine? They need to know if you have any of these conditions:  heart disease  history of irregular heartbeat  infection  kidney disease  liver disease  low blood counts, like white cells, platelets, or red blood cells  on hemodialysis  recent or ongoing radiation  therapy  scarring or thickening of the lungs  trouble passing urine  an unusual or allergic reaction to cyclophosphamide, other medicines, foods, dyes, or preservatives  pregnant or trying to get pregnant  breast-feeding How should I use this medicine? This drug is usually given as an injection into a vein or muscle or by infusion into a vein. It is administered in a hospital or clinic by a specially trained health care professional. Talk to your pediatrician regarding the use of this medicine in children. Special care may be needed. Overdosage: If you think you have taken too much of this medicine contact a poison control center or emergency room at once. NOTE: This medicine is only for you. Do not share this medicine with others. What if I miss a dose? It is important not to miss your dose. Call your doctor or health care professional if you are unable to keep an appointment. What may interact with this medicine?  amphotericin B  azathioprine  certain antivirals for HIV or hepatitis  certain medicines for blood pressure, heart disease, irregular heart beat  certain medicines that treat or prevent blood clots like warfarin  certain other medicines for cancer  cyclosporine  etanercept  indomethacin  medicines that relax muscles for surgery  medicines to increase blood counts  metronidazole This list may not describe all possible interactions. Give your health care provider a list of all the medicines, herbs, non-prescription drugs, or dietary supplements you use. Also tell them if you smoke, drink alcohol, or use illegal drugs. Some items may interact with your medicine. What should I watch for while using this  medicine? Your condition will be monitored carefully while you are receiving this medicine. You may need blood work done while you are taking this medicine. Drink water or other fluids as directed. Urinate often, even at night. Some products may contain alcohol.  Ask your health care professional if this medicine contains alcohol. Be sure to tell all health care professionals you are taking this medicine. Certain medicines, like metronidazole and disulfiram, can cause an unpleasant reaction when taken with alcohol. The reaction includes flushing, headache, nausea, vomiting, sweating, and increased thirst. The reaction can last from 30 minutes to several hours. Do not become pregnant while taking this medicine or for 1 year after stopping it. Women should inform their health care professional if they wish to become pregnant or think they might be pregnant. Men should not father a child while taking this medicine and for 4 months after stopping it. There is potential for serious side effects to an unborn child. Talk to your health care professional for more information. Do not breast-feed an infant while taking this medicine or for 1 week after stopping it. This medicine has caused ovarian failure in some women. This medicine may make it more difficult to get pregnant. Talk to your health care professional if you are concerned about your fertility. This medicine has caused decreased sperm counts in some men. This may make it more difficult to father a child. Talk to your health care professional if you are concerned about your fertility. Call your health care professional for advice if you get a fever, chills, or sore throat, or other symptoms of a cold or flu. Do not treat yourself. This medicine decreases your body's ability to fight infections. Try to avoid being around people who are sick. Avoid taking medicines that contain aspirin, acetaminophen, ibuprofen, naproxen, or ketoprofen unless instructed by your health care professional. These medicines may hide a fever. Talk to your health care professional about your risk of cancer. You may be more at risk for certain types of cancer if you take this medicine. If you are going to need surgery or other procedure, tell  your health care professional that you are using this medicine. Be careful brushing or flossing your teeth or using a toothpick because you may get an infection or bleed more easily. If you have any dental work done, tell your dentist you are receiving this medicine. What side effects may I notice from receiving this medicine? Side effects that you should report to your doctor or health care professional as soon as possible:  allergic reactions like skin rash, itching or hives, swelling of the face, lips, or tongue  breathing problems  nausea, vomiting  signs and symptoms of bleeding such as bloody or black, tarry stools; red or dark brown urine; spitting up blood or brown material that looks like coffee grounds; red spots on the skin; unusual bruising or bleeding from the eyes, gums, or nose  signs and symptoms of heart failure like fast, irregular heartbeat, sudden weight gain; swelling of the ankles, feet, hands  signs and symptoms of infection like fever; chills; cough; sore throat; pain or trouble passing urine  signs and symptoms of kidney injury like trouble passing urine or change in the amount of urine  signs and symptoms of liver injury like dark yellow or brown urine; general ill feeling or flu-like symptoms; light-colored stools; loss of appetite; nausea; right upper belly pain; unusually weak or tired; yellowing of the eyes or skin Side effects that usually  do not require medical attention (report to your doctor or health care professional if they continue or are bothersome):  confusion  decreased hearing  diarrhea  facial flushing  hair loss  headache  loss of appetite  missed menstrual periods  signs and symptoms of low red blood cells or anemia such as unusually weak or tired; feeling faint or lightheaded; falls  skin discoloration This list may not describe all possible side effects. Call your doctor for medical advice about side effects. You may report side  effects to FDA at 1-800-FDA-1088. Where should I keep my medicine? This drug is given in a hospital or clinic and will not be stored at home. NOTE: This sheet is a summary. It may not cover all possible information. If you have questions about this medicine, talk to your doctor, pharmacist, or health care provider.  2020 Elsevier/Gold Standard (2018-11-15 09:53:29) Carfilzomib injection What is this medicine? CARFILZOMIB (kar FILZ oh mib) targets a specific protein within cancer cells and stops the cancer cells from growing. It is used to treat multiple myeloma. This medicine may be used for other purposes; ask your health care provider or pharmacist if you have questions. COMMON BRAND NAME(S): KYPROLIS What should I tell my health care provider before I take this medicine? They need to know if you have any of these conditions:  heart disease  history of blood clots  irregular heartbeat  kidney disease  liver disease  lung or breathing disease  an unusual or allergic reaction to carfilzomib, or other medicines, foods, dyes, or preservatives  pregnant or trying to get pregnant  breast-feeding How should I use this medicine? This medicine is for injection or infusion into a vein. It is given by a health care professional in a hospital or clinic setting. Talk to your pediatrician regarding the use of this medicine in children. Special care may be needed. Overdosage: If you think you have taken too much of this medicine contact a poison control center or emergency room at once. NOTE: This medicine is only for you. Do not share this medicine with others. What if I miss a dose? It is important not to miss your dose. Call your doctor or health care professional if you are unable to keep an appointment. What may interact with this medicine? Interactions are not expected. Give your health care provider a list of all the medicines, herbs, non-prescription drugs, or dietary supplements  you use. Also tell them if you smoke, drink alcohol, or use illegal drugs. Some items may interact with your medicine. This list may not describe all possible interactions. Give your health care provider a list of all the medicines, herbs, non-prescription drugs, or dietary supplements you use. Also tell them if you smoke, drink alcohol, or use illegal drugs. Some items may interact with your medicine. What should I watch for while using this medicine? Your condition will be monitored carefully while you are receiving this medicine. Report any side effects. Continue your course of treatment even though you feel ill unless your doctor tells you to stop. You may need blood work done while you are taking this medicine. Do not become pregnant while taking this medicine or for at least 6 months after stopping it. Women should inform their doctor if they wish to become pregnant or think they might be pregnant. There is a potential for serious side effects to an unborn child. Men should not father a child while taking this medicine and for at least 3 months  after stopping it. Talk to your health care professional or pharmacist for more information. Do not breast-feed an infant while taking this medicine or for 2 weeks after the last dose. Check with your doctor or health care professional if you get an attack of severe diarrhea, nausea and vomiting, or if you sweat a lot. The loss of too much body fluid can make it dangerous for you to take this medicine. You may get dizzy. Do not drive, use machinery, or do anything that needs mental alertness until you know how this medicine affects you. Do not stand or sit up quickly, especially if you are an older patient. This reduces the risk of dizzy or fainting spells. What side effects may I notice from receiving this medicine? Side effects that you should report to your doctor or health care professional as soon as possible:  allergic reactions like skin rash, itching  or hives, swelling of the face, lips, or tongue  confusion  dizziness  feeling faint or lightheaded  fever or chills  palpitations  seizures  signs and symptoms of bleeding such as bloody or black, tarry stools; red or dark-brown urine; spitting up blood or brown material that looks like coffee grounds; red spots on the skin; unusual bruising or bleeding including from the eye, gums, or nose  signs and symptoms of a blood clot such as breathing problems; changes in vision; chest pain; severe, sudden headache; pain, swelling, warmth in the leg; trouble speaking; sudden numbness or weakness of the face, arm or leg  signs and symptoms of kidney injury like trouble passing urine or change in the amount of urine  signs and symptoms of liver injury like dark yellow or brown urine; general ill feeling or flu-like symptoms; light-colored stools; loss of appetite; nausea; right upper belly pain; unusually weak or tired; yellowing of the eyes or skin Side effects that usually do not require medical attention (report to your doctor or health care professional if they continue or are bothersome):  back pain  cough  diarrhea  headache  muscle cramps  trouble sleeping  vomiting This list may not describe all possible side effects. Call your doctor for medical advice about side effects. You may report side effects to FDA at 1-800-FDA-1088. Where should I keep my medicine? This drug is given in a hospital or clinic and will not be stored at home. NOTE: This sheet is a summary. It may not cover all possible information. If you have questions about this medicine, talk to your doctor, pharmacist, or health care provider.  2020 Elsevier/Gold Standard (2018-10-18 19:44:21)

## 2019-05-12 NOTE — Telephone Encounter (Signed)
Contacted patient son Debbie Chan and explained that the patient is requesting refills for the Lopressor and Prilosec but she has refills at the pharmacy she can call in. Debbie Chan stated that he is aware that refills are available and will take care of them. Also explained that contacted the patient's PCP and they are requesting that the patient make a follow up appointment before they can assume the refills and prescriptions for the above medications. [name] verbalized understanding. Also made him aware that a new prescription for Compazine has been called in and that is to be taken as needed for nausea. He understands to pick the medication up today and had no other questions or concerns.

## 2019-05-12 NOTE — Patient Instructions (Signed)

## 2019-05-13 ENCOUNTER — Other Ambulatory Visit: Payer: Self-pay

## 2019-05-13 ENCOUNTER — Inpatient Hospital Stay: Payer: Self-pay

## 2019-05-13 ENCOUNTER — Encounter: Payer: Self-pay | Admitting: Pharmacy Technician

## 2019-05-13 VITALS — BP 141/70 | HR 57 | Temp 98.5°F | Resp 17

## 2019-05-13 DIAGNOSIS — C9000 Multiple myeloma not having achieved remission: Secondary | ICD-10-CM

## 2019-05-13 MED ORDER — DEXTROSE 5 % IV SOLN
40.0000 mg | Freq: Once | INTRAVENOUS | Status: AC
  Administered 2019-05-13: 10:00:00 40 mg via INTRAVENOUS
  Filled 2019-05-13: qty 5

## 2019-05-13 MED ORDER — SODIUM CHLORIDE 0.9% FLUSH
10.0000 mL | INTRAVENOUS | Status: DC | PRN
  Administered 2019-05-13: 10:00:00 10 mL
  Filled 2019-05-13: qty 10

## 2019-05-13 MED ORDER — HEPARIN SOD (PORK) LOCK FLUSH 100 UNIT/ML IV SOLN
500.0000 [IU] | Freq: Once | INTRAVENOUS | Status: AC | PRN
  Administered 2019-05-13: 500 [IU]
  Filled 2019-05-13: qty 5

## 2019-05-13 MED ORDER — SODIUM CHLORIDE 0.9 % IV SOLN
Freq: Once | INTRAVENOUS | Status: AC
  Filled 2019-05-13: qty 250

## 2019-05-13 MED ORDER — DEXAMETHASONE SODIUM PHOSPHATE 10 MG/ML IJ SOLN
10.0000 mg | Freq: Once | INTRAMUSCULAR | Status: AC
  Administered 2019-05-13: 10 mg via INTRAVENOUS

## 2019-05-13 MED ORDER — DEXAMETHASONE SODIUM PHOSPHATE 10 MG/ML IJ SOLN
INTRAMUSCULAR | Status: AC
  Filled 2019-05-13: qty 1

## 2019-05-13 NOTE — Progress Notes (Signed)
Patient has been approved for drug assistance by [employer] for Kyprolis. The enrollment period is from 05/12/19-05/12/20 based on self pay. First DOS covered is 05/12/19.

## 2019-05-13 NOTE — Patient Instructions (Signed)
[facility] Discharge Instructions for Patients Receiving Chemotherapy  Today you received the following chemotherapy agents:  Kyprolis  To help prevent nausea and vomiting after your treatment, we encourage you to take your nausea medication as directed.   If you develop nausea and vomiting that is not controlled by your nausea medication, call the clinic.   BELOW ARE SYMPTOMS THAT SHOULD BE REPORTED IMMEDIATELY:  *FEVER GREATER THAN 100.5 F  *CHILLS WITH OR WITHOUT FEVER  NAUSEA AND VOMITING THAT IS NOT CONTROLLED WITH YOUR NAUSEA MEDICATION  *UNUSUAL SHORTNESS OF BREATH  *UNUSUAL BRUISING OR BLEEDING  TENDERNESS IN MOUTH AND THROAT WITH OR WITHOUT PRESENCE OF ULCERS  *URINARY PROBLEMS  *BOWEL PROBLEMS  UNUSUAL RASH Items with * indicate a potential emergency and should be followed up as soon as possible.  Feel free to call the clinic should you have any questions or concerns. The clinic phone number is [phone].  Please show the CHEMO ALERT CARD at check-in to the Emergency Department and triage nurse.   

## 2019-05-16 ENCOUNTER — Telehealth: Payer: Self-pay | Admitting: Physician Assistant

## 2019-05-16 NOTE — Telephone Encounter (Signed)
Scheduled per los. Called and left msg. Mailed printout of added appts 

## 2019-05-19 ENCOUNTER — Other Ambulatory Visit: Payer: Self-pay

## 2019-05-19 ENCOUNTER — Inpatient Hospital Stay: Payer: Self-pay

## 2019-05-19 VITALS — BP 147/75 | HR 69 | Temp 98.0°F | Resp 18 | Wt 208.9 lb

## 2019-05-19 DIAGNOSIS — C9000 Multiple myeloma not having achieved remission: Secondary | ICD-10-CM

## 2019-05-19 DIAGNOSIS — Z95828 Presence of other vascular implants and grafts: Secondary | ICD-10-CM

## 2019-05-19 LAB — CBC WITH DIFFERENTIAL (CANCER CENTER ONLY)
Abs Immature Granulocytes: 0.02 10*3/uL (ref 0.00–0.07)
Basophils Absolute: 0 10*3/uL (ref 0.0–0.1)
Basophils Relative: 1 %
Eosinophils Absolute: 0.1 10*3/uL (ref 0.0–0.5)
Eosinophils Relative: 1 %
HCT: 34.9 % — ABNORMAL LOW (ref 36.0–46.0)
Hemoglobin: 11.6 g/dL — ABNORMAL LOW (ref 12.0–15.0)
Immature Granulocytes: 0 %
Lymphocytes Relative: 16 %
Lymphs Abs: 0.7 10*3/uL (ref 0.7–4.0)
MCH: 30.6 pg (ref 26.0–34.0)
MCHC: 33.2 g/dL (ref 30.0–36.0)
MCV: 92.1 fL (ref 80.0–100.0)
Monocytes Absolute: 0.3 10*3/uL (ref 0.1–1.0)
Monocytes Relative: 6 %
Neutro Abs: 3.4 10*3/uL (ref 1.7–7.7)
Neutrophils Relative %: 76 %
Platelet Count: 169 10*3/uL (ref 150–400)
RBC: 3.79 MIL/uL — ABNORMAL LOW (ref 3.87–5.11)
RDW: 14.6 % (ref 11.5–15.5)
WBC Count: 4.5 10*3/uL (ref 4.0–10.5)
nRBC: 0 % (ref 0.0–0.2)

## 2019-05-19 LAB — CMP (CANCER CENTER ONLY)
ALT: 13 U/L (ref 0–44)
AST: 16 U/L (ref 15–41)
Albumin: 3.3 g/dL — ABNORMAL LOW (ref 3.5–5.0)
Alkaline Phosphatase: 91 U/L (ref 38–126)
Anion gap: 8 (ref 5–15)
BUN: 13 mg/dL (ref 8–23)
CO2: 26 mmol/L (ref 22–32)
Calcium: 9.2 mg/dL (ref 8.9–10.3)
Chloride: 107 mmol/L (ref 98–111)
Creatinine: 1.17 mg/dL — ABNORMAL HIGH (ref 0.44–1.00)
GFR, Est AFR Am: 55 mL/min — ABNORMAL LOW (ref >60)
GFR, Estimated: 48 mL/min — ABNORMAL LOW (ref >60)
Glucose, Bld: 108 mg/dL — ABNORMAL HIGH (ref 70–99)
Potassium: 4.6 mmol/L (ref 3.5–5.1)
Sodium: 141 mmol/L (ref 135–145)
Total Bilirubin: 0.3 mg/dL (ref 0.3–1.2)
Total Protein: 6.8 g/dL (ref 6.5–8.1)

## 2019-05-19 MED ORDER — SODIUM CHLORIDE 0.9% FLUSH
10.0000 mL | INTRAVENOUS | Status: DC | PRN
  Administered 2019-05-19: 11:00:00 10 mL
  Filled 2019-05-19: qty 10

## 2019-05-19 MED ORDER — SODIUM CHLORIDE 0.9 % IV SOLN
Freq: Once | INTRAVENOUS | Status: AC
  Filled 2019-05-19: qty 250

## 2019-05-19 MED ORDER — PALONOSETRON HCL INJECTION 0.25 MG/5ML
INTRAVENOUS | Status: AC
  Filled 2019-05-19: qty 5

## 2019-05-19 MED ORDER — SODIUM CHLORIDE 0.9% FLUSH
10.0000 mL | Freq: Once | INTRAVENOUS | Status: AC
  Administered 2019-05-19: 10 mL
  Filled 2019-05-19: qty 10

## 2019-05-19 MED ORDER — SODIUM CHLORIDE 0.9 % IV SOLN
300.0000 mg/m2 | Freq: Once | INTRAVENOUS | Status: AC
  Administered 2019-05-19: 11:00:00 640 mg via INTRAVENOUS
  Filled 2019-05-19: qty 32

## 2019-05-19 MED ORDER — DEXTROSE 5 % IV SOLN
37.0000 mg/m2 | Freq: Once | INTRAVENOUS | Status: AC
  Administered 2019-05-19: 10:00:00 80 mg via INTRAVENOUS
  Filled 2019-05-19: qty 10

## 2019-05-19 MED ORDER — PALONOSETRON HCL INJECTION 0.25 MG/5ML
0.2500 mg | Freq: Once | INTRAVENOUS | Status: AC
  Administered 2019-05-19: 09:00:00 0.25 mg via INTRAVENOUS

## 2019-05-19 MED ORDER — HEPARIN SOD (PORK) LOCK FLUSH 100 UNIT/ML IV SOLN
500.0000 [IU] | Freq: Once | INTRAVENOUS | Status: AC | PRN
  Administered 2019-05-19: 11:00:00 500 [IU]
  Filled 2019-05-19: qty 5

## 2019-05-19 NOTE — Patient Instructions (Signed)

## 2019-05-19 NOTE — Patient Instructions (Signed)
[facility] Discharge Instructions for Patients Receiving Chemotherapy  Today you received the following chemotherapy agents: Kyprolis/Cytoxan.  To help prevent nausea and vomiting after your treatment, we encourage you to take your nausea medication as directed.   If you develop nausea and vomiting that is not controlled by your nausea medication, call the clinic.   BELOW ARE SYMPTOMS THAT SHOULD BE REPORTED IMMEDIATELY:  *FEVER GREATER THAN 100.5 F  *CHILLS WITH OR WITHOUT FEVER  NAUSEA AND VOMITING THAT IS NOT CONTROLLED WITH YOUR NAUSEA MEDICATION  *UNUSUAL SHORTNESS OF BREATH  *UNUSUAL BRUISING OR BLEEDING  TENDERNESS IN MOUTH AND THROAT WITH OR WITHOUT PRESENCE OF ULCERS  *URINARY PROBLEMS  *BOWEL PROBLEMS  UNUSUAL RASH Items with * indicate a potential emergency and should be followed up as soon as possible.  Feel free to call the clinic should you have any questions or concerns. The clinic phone number is [phone].  Please show the CHEMO ALERT CARD at check-in to the Emergency Department and triage nurse.   

## 2019-05-19 NOTE — Progress Notes (Signed)
Pt reports taking decadron at home as prescribed for her tx today.  Pt declines interpreter at this time, verbalizes understanding of all instructions and of tx plan today, states she will let RN know if she needs interpreter services.  Pt able to eat/drink/use restroom during tx w/out any issues.  Pt reports bilat tingling/numb feeling in hands at end of tx.  Hx neuropathy in feet, pt states it feels the same in her hands.  Pt advised to contact CC if sensation doesn't improve by tomorrow or in the next few days.  PA Cassie made aware of new neuropathy reported.

## 2019-05-20 ENCOUNTER — Inpatient Hospital Stay: Payer: Self-pay

## 2019-05-20 ENCOUNTER — Other Ambulatory Visit: Payer: Self-pay

## 2019-05-20 VITALS — BP 149/78 | HR 68 | Temp 98.1°F | Resp 20

## 2019-05-20 DIAGNOSIS — C9000 Multiple myeloma not having achieved remission: Secondary | ICD-10-CM

## 2019-05-20 MED ORDER — SODIUM CHLORIDE 0.9 % IV SOLN
Freq: Once | INTRAVENOUS | Status: AC
  Filled 2019-05-20: qty 250

## 2019-05-20 MED ORDER — HEPARIN SOD (PORK) LOCK FLUSH 100 UNIT/ML IV SOLN
500.0000 [IU] | Freq: Once | INTRAVENOUS | Status: AC | PRN
  Administered 2019-05-20: 500 [IU]
  Filled 2019-05-20: qty 5

## 2019-05-20 MED ORDER — DEXAMETHASONE SODIUM PHOSPHATE 10 MG/ML IJ SOLN
INTRAMUSCULAR | Status: AC
  Filled 2019-05-20: qty 1

## 2019-05-20 MED ORDER — DEXAMETHASONE SODIUM PHOSPHATE 10 MG/ML IJ SOLN
10.0000 mg | Freq: Once | INTRAMUSCULAR | Status: AC
  Administered 2019-05-20: 10 mg via INTRAVENOUS

## 2019-05-20 MED ORDER — DEXTROSE 5 % IV SOLN
37.0000 mg/m2 | Freq: Once | INTRAVENOUS | Status: AC
  Administered 2019-05-20: 80 mg via INTRAVENOUS
  Filled 2019-05-20: qty 30

## 2019-05-20 MED ORDER — SODIUM CHLORIDE 0.9% FLUSH
10.0000 mL | INTRAVENOUS | Status: DC | PRN
  Administered 2019-05-20: 10 mL
  Filled 2019-05-20: qty 10

## 2019-05-20 NOTE — Patient Instructions (Signed)
[facility] Discharge Instructions for Patients Receiving Chemotherapy  Today you received the following chemotherapy agents:  Kyprolis  To help prevent nausea and vomiting after your treatment, we encourage you to take your nausea medication as directed.   If you develop nausea and vomiting that is not controlled by your nausea medication, call the clinic.   BELOW ARE SYMPTOMS THAT SHOULD BE REPORTED IMMEDIATELY:  *FEVER GREATER THAN 100.5 F  *CHILLS WITH OR WITHOUT FEVER  NAUSEA AND VOMITING THAT IS NOT CONTROLLED WITH YOUR NAUSEA MEDICATION  *UNUSUAL SHORTNESS OF BREATH  *UNUSUAL BRUISING OR BLEEDING  TENDERNESS IN MOUTH AND THROAT WITH OR WITHOUT PRESENCE OF ULCERS  *URINARY PROBLEMS  *BOWEL PROBLEMS  UNUSUAL RASH Items with * indicate a potential emergency and should be followed up as soon as possible.  Feel free to call the clinic should you have any questions or concerns. The clinic phone number is [phone].  Please show the CHEMO ALERT CARD at check-in to the Emergency Department and triage nurse.   

## 2019-05-20 NOTE — Progress Notes (Signed)
Pharmacist Chemotherapy Monitoring - Follow Up Assessment    I verify that I have reviewed each item in the below checklist:  . Regimen for the patient is scheduled for the appropriate day and plan matches scheduled date. Marland Kitchen Appropriate non-routine labs are ordered dependent on drug ordered. . If applicable, additional medications reviewed and ordered per protocol based on lifetime cumulative doses and/or treatment regimen.   Plan for follow-up and/or issues identified: No . I-vent associated with next due treatment: No    Kennith Center, Pharm.D., CPP 05/20/2019@4 :39 PM

## 2019-05-23 NOTE — Progress Notes (Signed)
Pharmacist Chemotherapy Monitoring - Follow Up Assessment    I verify that I have reviewed each item in the below checklist:  . Regimen for the patient is scheduled for the appropriate day and plan matches scheduled date. Marland Kitchen Appropriate non-routine labs are ordered dependent on drug ordered. . If applicable, additional medications reviewed and ordered per protocol based on lifetime cumulative doses and/or treatment regimen.   Plan for follow-up and/or issues identified: No . I-vent associated with next due treatment: No . MD and/or nursing notified: No  Mateya Torti D 05/23/2019 3:29 PM

## 2019-05-26 ENCOUNTER — Other Ambulatory Visit: Payer: Self-pay | Admitting: Medical

## 2019-05-26 ENCOUNTER — Inpatient Hospital Stay: Payer: Self-pay | Attending: Internal Medicine

## 2019-05-26 ENCOUNTER — Inpatient Hospital Stay: Payer: Self-pay

## 2019-05-26 ENCOUNTER — Other Ambulatory Visit: Payer: Self-pay

## 2019-05-26 ENCOUNTER — Telehealth: Payer: Self-pay | Admitting: Medical Oncology

## 2019-05-26 VITALS — BP 160/76 | HR 67 | Temp 97.8°F | Resp 18 | Wt 206.2 lb

## 2019-05-26 DIAGNOSIS — C9000 Multiple myeloma not having achieved remission: Secondary | ICD-10-CM

## 2019-05-26 DIAGNOSIS — Z5111 Encounter for antineoplastic chemotherapy: Secondary | ICD-10-CM | POA: Insufficient documentation

## 2019-05-26 DIAGNOSIS — G629 Polyneuropathy, unspecified: Secondary | ICD-10-CM | POA: Insufficient documentation

## 2019-05-26 DIAGNOSIS — Z95828 Presence of other vascular implants and grafts: Secondary | ICD-10-CM

## 2019-05-26 DIAGNOSIS — I10 Essential (primary) hypertension: Secondary | ICD-10-CM | POA: Insufficient documentation

## 2019-05-26 LAB — CMP (CANCER CENTER ONLY)
ALT: 15 U/L (ref 0–44)
AST: 16 U/L (ref 15–41)
Albumin: 3.6 g/dL (ref 3.5–5.0)
Alkaline Phosphatase: 99 U/L (ref 38–126)
Anion gap: 12 (ref 5–15)
BUN: 16 mg/dL (ref 8–23)
CO2: 25 mmol/L (ref 22–32)
Calcium: 9.2 mg/dL (ref 8.9–10.3)
Chloride: 106 mmol/L (ref 98–111)
Creatinine: 1.4 mg/dL — ABNORMAL HIGH (ref 0.44–1.00)
GFR, Est AFR Am: 44 mL/min — ABNORMAL LOW (ref >60)
GFR, Estimated: 38 mL/min — ABNORMAL LOW (ref >60)
Glucose, Bld: 108 mg/dL — ABNORMAL HIGH (ref 70–99)
Potassium: 4.4 mmol/L (ref 3.5–5.1)
Sodium: 143 mmol/L (ref 135–145)
Total Bilirubin: 0.4 mg/dL (ref 0.3–1.2)
Total Protein: 7.3 g/dL (ref 6.5–8.1)

## 2019-05-26 LAB — CBC WITH DIFFERENTIAL (CANCER CENTER ONLY)
Abs Immature Granulocytes: 0.02 10*3/uL (ref 0.00–0.07)
Basophils Absolute: 0 10*3/uL (ref 0.0–0.1)
Basophils Relative: 0 %
Eosinophils Absolute: 0 10*3/uL (ref 0.0–0.5)
Eosinophils Relative: 1 %
HCT: 35.9 % — ABNORMAL LOW (ref 36.0–46.0)
Hemoglobin: 12.1 g/dL (ref 12.0–15.0)
Immature Granulocytes: 0 %
Lymphocytes Relative: 9 %
Lymphs Abs: 0.4 10*3/uL — ABNORMAL LOW (ref 0.7–4.0)
MCH: 30.6 pg (ref 26.0–34.0)
MCHC: 33.7 g/dL (ref 30.0–36.0)
MCV: 90.7 fL (ref 80.0–100.0)
Monocytes Absolute: 0.1 10*3/uL (ref 0.1–1.0)
Monocytes Relative: 2 %
Neutro Abs: 4.1 10*3/uL (ref 1.7–7.7)
Neutrophils Relative %: 88 %
Platelet Count: 84 10*3/uL — ABNORMAL LOW (ref 150–400)
RBC: 3.96 MIL/uL (ref 3.87–5.11)
RDW: 15.6 % — ABNORMAL HIGH (ref 11.5–15.5)
WBC Count: 4.7 10*3/uL (ref 4.0–10.5)
nRBC: 0 % (ref 0.0–0.2)

## 2019-05-26 MED ORDER — HEPARIN SOD (PORK) LOCK FLUSH 100 UNIT/ML IV SOLN
500.0000 [IU] | Freq: Once | INTRAVENOUS | Status: DC
  Filled 2019-05-26: qty 5

## 2019-05-26 MED ORDER — PALONOSETRON HCL INJECTION 0.25 MG/5ML
INTRAVENOUS | Status: AC
  Filled 2019-05-26: qty 5

## 2019-05-26 MED ORDER — HEPARIN SOD (PORK) LOCK FLUSH 100 UNIT/ML IV SOLN
500.0000 [IU] | Freq: Once | INTRAVENOUS | Status: AC | PRN
  Administered 2019-05-26: 500 [IU]
  Filled 2019-05-26: qty 5

## 2019-05-26 MED ORDER — SODIUM CHLORIDE 0.9% FLUSH
10.0000 mL | INTRAVENOUS | Status: DC | PRN
  Administered 2019-05-26: 10 mL
  Filled 2019-05-26: qty 10

## 2019-05-26 MED ORDER — SODIUM CHLORIDE 0.9 % IV SOLN
300.0000 mg/m2 | Freq: Once | INTRAVENOUS | Status: AC
  Administered 2019-05-26: 640 mg via INTRAVENOUS
  Filled 2019-05-26: qty 32

## 2019-05-26 MED ORDER — PALONOSETRON HCL INJECTION 0.25 MG/5ML
0.2500 mg | Freq: Once | INTRAVENOUS | Status: AC
  Administered 2019-05-26: 0.25 mg via INTRAVENOUS

## 2019-05-26 MED ORDER — SODIUM CHLORIDE 0.9 % IV SOLN
Freq: Once | INTRAVENOUS | Status: AC
  Filled 2019-05-26: qty 250

## 2019-05-26 MED ORDER — DEXTROSE 5 % IV SOLN
37.0000 mg/m2 | Freq: Once | INTRAVENOUS | Status: AC
  Administered 2019-05-26: 80 mg via INTRAVENOUS
  Filled 2019-05-26: qty 30

## 2019-05-26 MED ORDER — SODIUM CHLORIDE 0.9% FLUSH
10.0000 mL | INTRAVENOUS | Status: DC | PRN
  Administered 2019-05-26: 10 mL via INTRAVENOUS
  Filled 2019-05-26: qty 10

## 2019-05-26 MED FILL — DEXAMETHASONE 4 MG TABLET: 4 | 30 days supply | Qty: 40 | Fill #1

## 2019-05-26 NOTE — Patient Instructions (Signed)
[facility] Discharge Instructions for Patients Receiving Chemotherapy  Today you received the following chemotherapy agents: carfilzomib and cyclophosphamide.  To help prevent nausea and vomiting after your treatment, we encourage you to take your nausea medication as directed.   If you develop nausea and vomiting that is not controlled by your nausea medication, call the clinic.   BELOW ARE SYMPTOMS THAT SHOULD BE REPORTED IMMEDIATELY:  *FEVER GREATER THAN 100.5 F  *CHILLS WITH OR WITHOUT FEVER  NAUSEA AND VOMITING THAT IS NOT CONTROLLED WITH YOUR NAUSEA MEDICATION  *UNUSUAL SHORTNESS OF BREATH  *UNUSUAL BRUISING OR BLEEDING  TENDERNESS IN MOUTH AND THROAT WITH OR WITHOUT PRESENCE OF ULCERS  *URINARY PROBLEMS  *BOWEL PROBLEMS  UNUSUAL RASH Items with * indicate a potential emergency and should be followed up as soon as possible.  Feel free to call the clinic should you have any questions or concerns. The clinic phone number is [phone].  Please show the CHEMO ALERT CARD at check-in to the Emergency Department and triage nurse.   

## 2019-05-26 NOTE — Progress Notes (Signed)
Per Dr. Julien Nordmann, it is okay to treat pt today with chemotherapy and todays platelets of 84K.

## 2019-05-26 NOTE — Telephone Encounter (Signed)
Requst refill lasix and decadron.  Records indicate Dr Markus Daft discontinued Lasix on 05/10/19

## 2019-05-27 ENCOUNTER — Other Ambulatory Visit: Payer: Self-pay

## 2019-05-27 ENCOUNTER — Inpatient Hospital Stay: Payer: Self-pay

## 2019-05-27 VITALS — BP 154/82 | HR 72 | Temp 99.0°F | Resp 18

## 2019-05-27 DIAGNOSIS — C9000 Multiple myeloma not having achieved remission: Secondary | ICD-10-CM

## 2019-05-27 MED ORDER — SODIUM CHLORIDE 0.9 % IV SOLN
Freq: Once | INTRAVENOUS | Status: AC
  Filled 2019-05-27: qty 250

## 2019-05-27 MED ORDER — HEPARIN SOD (PORK) LOCK FLUSH 100 UNIT/ML IV SOLN
500.0000 [IU] | Freq: Once | INTRAVENOUS | Status: AC | PRN
  Administered 2019-05-27: 500 [IU]
  Filled 2019-05-27: qty 5

## 2019-05-27 MED ORDER — SODIUM CHLORIDE 0.9% FLUSH
10.0000 mL | INTRAVENOUS | Status: DC | PRN
  Administered 2019-05-27: 10 mL
  Filled 2019-05-27: qty 10

## 2019-05-27 MED ORDER — DEXTROSE 5 % IV SOLN
37.0000 mg/m2 | Freq: Once | INTRAVENOUS | Status: AC
  Administered 2019-05-27: 80 mg via INTRAVENOUS
  Filled 2019-05-27: qty 30

## 2019-05-27 MED ORDER — DEXAMETHASONE SODIUM PHOSPHATE 10 MG/ML IJ SOLN
10.0000 mg | Freq: Once | INTRAMUSCULAR | Status: AC
  Administered 2019-05-27: 10 mg via INTRAVENOUS

## 2019-05-27 MED ORDER — DEXAMETHASONE SODIUM PHOSPHATE 10 MG/ML IJ SOLN
INTRAMUSCULAR | Status: AC
  Filled 2019-05-27: qty 1

## 2019-05-27 NOTE — Patient Instructions (Signed)
[facility] Discharge Instructions for Patients Receiving Chemotherapy  Today you received the following chemotherapy agents: carfilzomib.  To help prevent nausea and vomiting after your treatment, we encourage you to take your nausea medication as directed.   If you develop nausea and vomiting that is not controlled by your nausea medication, call the clinic.   BELOW ARE SYMPTOMS THAT SHOULD BE REPORTED IMMEDIATELY:  *FEVER GREATER THAN 100.5 F  *CHILLS WITH OR WITHOUT FEVER  NAUSEA AND VOMITING THAT IS NOT CONTROLLED WITH YOUR NAUSEA MEDICATION  *UNUSUAL SHORTNESS OF BREATH  *UNUSUAL BRUISING OR BLEEDING  TENDERNESS IN MOUTH AND THROAT WITH OR WITHOUT PRESENCE OF ULCERS  *URINARY PROBLEMS  *BOWEL PROBLEMS  UNUSUAL RASH Items with * indicate a potential emergency and should be followed up as soon as possible.  Feel free to call the clinic should you have any questions or concerns. The clinic phone number is [phone].  Please show the CHEMO ALERT CARD at check-in to the Emergency Department and triage nurse.   

## 2019-06-02 ENCOUNTER — Other Ambulatory Visit: Payer: Self-pay

## 2019-06-02 ENCOUNTER — Ambulatory Visit: Payer: Self-pay

## 2019-06-02 ENCOUNTER — Telehealth: Payer: Self-pay | Admitting: Medical Oncology

## 2019-06-02 ENCOUNTER — Ambulatory Visit: Payer: Self-pay | Admitting: Physician Assistant

## 2019-06-02 NOTE — Telephone Encounter (Signed)
Returned call and left message to call me back.

## 2019-06-03 ENCOUNTER — Ambulatory Visit: Payer: Self-pay

## 2019-06-03 NOTE — Progress Notes (Signed)
Pharmacist Chemotherapy Monitoring - Follow Up Assessment    I verify that I have reviewed each item in the below checklist:  . Regimen for the patient is scheduled for the appropriate day and plan matches scheduled date. Marland Kitchen Appropriate non-routine labs are ordered dependent on drug ordered. . If applicable, additional medications reviewed and ordered per protocol based on lifetime cumulative doses and/or treatment regimen.   Plan for follow-up and/or issues identified: No . I-vent associated with next due treatment: No . MD and/or nursing notified: No  Yamel Bale K 06/03/2019 8:59 AM

## 2019-06-06 MED FILL — METOPROLOL TARTRATE 25 MG T: 25 | 30 days supply | Qty: 30 | Fill #2

## 2019-06-06 MED FILL — OMEPRAZOLE 20 MG CAP: 20 | 30 days supply | Qty: 30 | Fill #1

## 2019-06-09 ENCOUNTER — Inpatient Hospital Stay (HOSPITAL_BASED_OUTPATIENT_CLINIC_OR_DEPARTMENT_OTHER): Payer: Self-pay | Admitting: Internal Medicine

## 2019-06-09 ENCOUNTER — Other Ambulatory Visit: Payer: Self-pay

## 2019-06-09 ENCOUNTER — Inpatient Hospital Stay: Payer: Self-pay

## 2019-06-09 ENCOUNTER — Encounter: Payer: Self-pay | Admitting: Internal Medicine

## 2019-06-09 VITALS — BP 173/80 | HR 74 | Temp 98.0°F | Resp 18 | Ht 68.0 in | Wt 201.2 lb

## 2019-06-09 DIAGNOSIS — I10 Essential (primary) hypertension: Secondary | ICD-10-CM

## 2019-06-09 DIAGNOSIS — Z5111 Encounter for antineoplastic chemotherapy: Secondary | ICD-10-CM

## 2019-06-09 DIAGNOSIS — C9000 Multiple myeloma not having achieved remission: Secondary | ICD-10-CM

## 2019-06-09 DIAGNOSIS — Z95828 Presence of other vascular implants and grafts: Secondary | ICD-10-CM | POA: Insufficient documentation

## 2019-06-09 LAB — CMP (CANCER CENTER ONLY)
ALT: 11 U/L (ref 0–44)
AST: 18 U/L (ref 15–41)
Albumin: 3.9 g/dL (ref 3.5–5.0)
Alkaline Phosphatase: 100 U/L (ref 38–126)
Anion gap: 9 (ref 5–15)
BUN: 11 mg/dL (ref 8–23)
CO2: 24 mmol/L (ref 22–32)
Calcium: 9.2 mg/dL (ref 8.9–10.3)
Chloride: 108 mmol/L (ref 98–111)
Creatinine: 1.14 mg/dL — ABNORMAL HIGH (ref 0.44–1.00)
GFR, Est AFR Am: 57 mL/min — ABNORMAL LOW (ref >60)
GFR, Estimated: 49 mL/min — ABNORMAL LOW (ref >60)
Glucose, Bld: 110 mg/dL — ABNORMAL HIGH (ref 70–99)
Potassium: 4.5 mmol/L (ref 3.5–5.1)
Sodium: 141 mmol/L (ref 135–145)
Total Bilirubin: 0.4 mg/dL (ref 0.3–1.2)
Total Protein: 7.4 g/dL (ref 6.5–8.1)

## 2019-06-09 LAB — CBC WITH DIFFERENTIAL (CANCER CENTER ONLY)
Abs Immature Granulocytes: 0.02 10*3/uL (ref 0.00–0.07)
Basophils Absolute: 0 10*3/uL (ref 0.0–0.1)
Basophils Relative: 0 %
Eosinophils Absolute: 0 10*3/uL (ref 0.0–0.5)
Eosinophils Relative: 0 %
HCT: 37.9 % (ref 36.0–46.0)
Hemoglobin: 12.6 g/dL (ref 12.0–15.0)
Immature Granulocytes: 0 %
Lymphocytes Relative: 11 %
Lymphs Abs: 0.5 10*3/uL — ABNORMAL LOW (ref 0.7–4.0)
MCH: 30.4 pg (ref 26.0–34.0)
MCHC: 33.2 g/dL (ref 30.0–36.0)
MCV: 91.3 fL (ref 80.0–100.0)
Monocytes Absolute: 0.1 10*3/uL (ref 0.1–1.0)
Monocytes Relative: 3 %
Neutro Abs: 3.8 10*3/uL (ref 1.7–7.7)
Neutrophils Relative %: 86 %
Platelet Count: 167 10*3/uL (ref 150–400)
RBC: 4.15 MIL/uL (ref 3.87–5.11)
RDW: 15.6 % — ABNORMAL HIGH (ref 11.5–15.5)
WBC Count: 4.5 10*3/uL (ref 4.0–10.5)
nRBC: 0 % (ref 0.0–0.2)

## 2019-06-09 MED ORDER — CLONIDINE HCL 0.1 MG PO TABS
0.1000 mg | ORAL_TABLET | Freq: Once | ORAL | Status: AC
  Administered 2019-06-09: 0.1 mg via ORAL

## 2019-06-09 MED ORDER — CLONIDINE HCL 0.1 MG PO TABS
ORAL_TABLET | ORAL | Status: AC
  Filled 2019-06-09: qty 1

## 2019-06-09 MED ORDER — PALONOSETRON HCL INJECTION 0.25 MG/5ML
INTRAVENOUS | Status: AC
  Filled 2019-06-09: qty 5

## 2019-06-09 MED ORDER — PALONOSETRON HCL INJECTION 0.25 MG/5ML
0.2500 mg | Freq: Once | INTRAVENOUS | Status: AC
  Administered 2019-06-09: 0.25 mg via INTRAVENOUS

## 2019-06-09 MED ORDER — HEPARIN SOD (PORK) LOCK FLUSH 100 UNIT/ML IV SOLN
500.0000 [IU] | Freq: Once | INTRAVENOUS | Status: AC | PRN
  Administered 2019-06-09: 14:00:00 500 [IU]
  Filled 2019-06-09: qty 5

## 2019-06-09 MED ORDER — SODIUM CHLORIDE 0.9% FLUSH
10.0000 mL | INTRAVENOUS | Status: DC | PRN
  Administered 2019-06-09: 10 mL
  Filled 2019-06-09: qty 10

## 2019-06-09 MED ORDER — SODIUM CHLORIDE 0.9 % IV SOLN
300.0000 mg/m2 | Freq: Once | INTRAVENOUS | Status: AC
  Administered 2019-06-09: 640 mg via INTRAVENOUS
  Filled 2019-06-09: qty 32

## 2019-06-09 MED ORDER — SODIUM CHLORIDE 0.9 % IV SOLN
Freq: Once | INTRAVENOUS | Status: DC
  Filled 2019-06-09: qty 250

## 2019-06-09 MED ORDER — SODIUM CHLORIDE 0.9 % IV SOLN
Freq: Once | INTRAVENOUS | Status: AC
  Filled 2019-06-09: qty 250

## 2019-06-09 MED ORDER — DEXTROSE 5 % IV SOLN
37.0000 mg/m2 | Freq: Once | INTRAVENOUS | Status: AC
  Administered 2019-06-09: 13:00:00 80 mg via INTRAVENOUS
  Filled 2019-06-09: qty 30

## 2019-06-09 NOTE — Progress Notes (Signed)
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  PRIOR THERAPY:  1)Systemic therapy with Velcade 1.3 mg/M2 weekly, Revlimid 25 mg p.o. daily for 14 days every 3 weeks in addition to weekly Decadron 40 mg orally. First dose of treatment 10/08/2017.Treatment was placed on hold due to development of a significant rash.Sheresumedtreatment withonlydexamethasone and Velcade on 11/19/2017.This was discontinued on 04/14/2019 due to evidence of disease progression. 2)Pomalyst 4 mg p.o. for 21 days every 4 weeks and 40 mg p.o. Decadron once a week. First dose3/06/2019-05/04/2019.Status post 6 days of treatment. Discontinued secondary to allergy to Pomalyst.  CURRENT THERAPY: Chemotherapy with carfilzomibon days 1, 2, 8, 9, 15, and 16and Cytoxan 300 mg/m2 on days 1,8, and 15 IV every 4 weeks and 40 mg p.o. weekly of Decadron. First dose expected on 05/12/2019.  INTERVAL HISTORY: Debbie Chan 69 y.o. female returns to the clinic today for follow-up visit.  The patient denied having any complaints today except for the aching pain and neuropathy in the lower extremities.  She denied having any current chest pain, shortness of breath, cough or hemoptysis.  She denied having any fever or chills.  She has no nausea, vomiting, diarrhea or constipation.  The patient denied having any recent weight loss or night sweats.  She has no headache or visual changes.  She tolerated the first cycle of her treatment with carfilzomib, Cytoxan and Decadron fairly well.  She is here today for evaluation before starting cycle #2.  MEDICAL HISTORY: Past Medical History:  Diagnosis Date  . Cancer ([place])     ALLERGIES:  has No Known Allergies.  MEDICATIONS:  Current Outpatient Medications  Medication Sig Dispense Refill  . acyclovir  (ZOVIRAX) 200 MG capsule Take 1 capsule (200 mg total) by mouth 2 (two) times daily. 60 capsule 2  . blood glucose meter kit and supplies Dispense based on patient and insurance preference. Use up to four times daily as directed. (FOR ICD-10 E10.9, E11.9). 1 each 0  . dexamethasone (DECADRON) 4 MG tablet Take 10 tablets (40 mg total) by mouth every 7 (seven) days. 40 tablet 3  . diphenhydrAMINE (BENADRYL ALLERGY) 25 MG tablet Take 1 tablet (25 mg total) by mouth every 6 (six) hours as needed for itching. 100 tablet 0  . lidocaine-prilocaine (EMLA) cream Apply 1 application topically as needed. 30 g 2  . methylPREDNISolone (MEDROL DOSEPAK) 4 MG TBPK tablet Use as instructed 21 tablet 0  . omeprazole (PRILOSEC) 20 MG capsule Take 1 capsule (20 mg total) by mouth daily. 30 capsule 3  . oxyCODONE-acetaminophen (PERCOCET) 5-325 MG tablet Take 2 tablets by mouth every 6 (six) hours as needed. 20 tablet 0  . prochlorperazine (COMPAZINE) 10 MG tablet Take 1 tablet (10 mg total) by mouth every 6 (six) hours as needed for nausea or vomiting. 30 tablet 2  . RELION GLUCOSE TEST STRIPS test strip USE 1 TO CHECK GLUCOSE ONCE DAILY    . ReliOn Ultra Thin Lancets 30G MISC USE 1 TO CHECK GLUCOSE ONCE DAILY FOR GLUCOSE TESTING    . traMADol (ULTRAM) 50 MG tablet Take 50 mg by mouth every 8 (eight) hours as needed.    . metoprolol tartrate (LOPRESSOR) 25 MG tablet Take 0.5 tablets (12.5 mg total) by mouth 2 (two) times daily. 30 tablet 3   No current facility-administered medications  for this visit.    SURGICAL HISTORY:  Past Surgical History:  Procedure Laterality Date  . IR IMAGING GUIDED PORT INSERTION  05/10/2019    REVIEW OF SYSTEMS:  A comprehensive review of systems was negative except for: Constitutional: positive for fatigue Neurological: positive for paresthesia   PHYSICAL EXAMINATION: General appearance: alert, cooperative, fatigued and no distress Head: Normocephalic, without obvious abnormality,  atraumatic Neck: no adenopathy, no JVD, supple, symmetrical, trachea midline and thyroid not enlarged, symmetric, no tenderness/mass/nodules Lymph nodes: Cervical, supraclavicular, and axillary nodes normal. Resp: clear to auscultation bilaterally Back: symmetric, no curvature. ROM normal. No CVA tenderness. Cardio: regular rate and rhythm, S1, S2 normal, no murmur, click, rub or gallop GI: soft, non-tender; bowel sounds normal; no masses,  no organomegaly Extremities: extremities normal, atraumatic, no cyanosis or edema  ECOG PERFORMANCE STATUS: 1 - Symptomatic but completely ambulatory  Blood pressure (!) 173/80, pulse 74, temperature 98 F (36.7 C), temperature source Oral, resp. rate 18, height '5\' 8"'  (1.727 m), weight 201 lb 3.2 oz (91.3 kg), SpO2 99 %.  LABORATORY DATA: Lab Results  Component Value Date   WBC 4.5 06/09/2019   HGB 12.6 06/09/2019   HCT 37.9 06/09/2019   MCV 91.3 06/09/2019   PLT 167 06/09/2019      Chemistry      Component Value Date/Time   NA 141 06/09/2019 1008   K 4.5 06/09/2019 1008   CL 108 06/09/2019 1008   CO2 24 06/09/2019 1008   BUN 11 06/09/2019 1008   CREATININE 1.14 (H) 06/09/2019 1008      Component Value Date/Time   CALCIUM 9.2 06/09/2019 1008   ALKPHOS 100 06/09/2019 1008   AST 18 06/09/2019 1008   ALT 11 06/09/2019 1008   BILITOT 0.4 06/09/2019 1008       RADIOGRAPHIC STUDIES: IR IMAGING GUIDED PORT INSERTION  Result Date: 05/10/2019 INDICATION: Multiple myeloma without remission. Port-A-Cath needed for treatment. EXAM: FLUOROSCOPIC AND ULTRASOUND GUIDED PLACEMENT OF A SUBCUTANEOUS PORT COMPARISON:  None. MEDICATIONS: Ancef 2 g; The antibiotic was administered within an appropriate time interval prior to skin puncture. ANESTHESIA/SEDATION: Versed 4.0 mg IV; Fentanyl 100 mcg IV; Moderate Sedation Time:  32 minutes The patient was continuously monitored during the procedure by the interventional radiology nurse under my direct  supervision. FLUOROSCOPY TIME:  18 seconds, 3 mGy COMPLICATIONS: None immediate. PROCEDURE: The procedure, risks, benefits, and alternatives were explained to the patient. Questions regarding the procedure were encouraged and answered. The patient understands and consents to the procedure. Patient was placed supine on the interventional table. Ultrasound confirmed a patent right internal jugular vein. Ultrasound image was saved for documentation. The right chest and neck were cleaned with a skin antiseptic and a sterile drape was placed. Maximal barrier sterile technique was utilized including caps, mask, sterile gowns, sterile gloves, sterile drape, hand hygiene and skin antiseptic. The right neck was anesthetized with 1% lidocaine. Small incision was made in the right neck with a blade. Micropuncture set was placed in the right internal jugular vein with ultrasound guidance. The micropuncture wire was used for measurement purposes. The right chest was anesthetized with 1% lidocaine with epinephrine. #15 blade was used to make an incision and a subcutaneous port pocket was formed. [place] was assembled. Subcutaneous tunnel was formed with a stiff tunneling device. The port catheter was brought through the subcutaneous tunnel. The port was placed in the subcutaneous pocket and sutured in place. The micropuncture set was exchanged for a peel-away  sheath. The catheter was placed through the peel-away sheath and the tip was positioned at the SVC and right atrium junction. Catheter placement was confirmed with fluoroscopy. The port was accessed and flushed with heparinized saline. The port pocket was closed using two layers of absorbable sutures and Dermabond. The vein skin site was closed using a single layer of absorbable suture and Dermabond. Sterile dressings were applied. Patient tolerated the procedure well without an immediate complication. Ultrasound and fluoroscopic images were taken and saved for  this procedure. IMPRESSION: Placement of a subcutaneous port device. Electronically Signed   By: Markus Daft M.D.   On: 05/10/2019 15:06    ASSESSMENT AND PLAN: This is a very pleasant 69 years old African female originally from [place] who is visiting her son in [place] and was recently diagnosed with IgA multiple myeloma. The patient was a started initially on treatment with weekly subcutaneous Velcade 1.3 mg/M2, Revlimid 25 mg p.o. daily for 21 days every 4 weeks as well as weekly Decadron 40 mg orally.  Revlimid was discontinued secondary to hypersensitivity reaction with significant skin rash.  She is currently on treatment with weekly Velcade and Decadron. Her treatment was discontinued secondary to disease progression.  The patient started treatment with Pomalyst and Decadron but this was discontinued secondary to hypersensitivity reaction to the Pomalyst. She is currently undergoing systemic chemotherapy with carfilzomib, Cytoxan and Decadron status post 1 cycle. She tolerated the first cycle of her treatment well with no concerning adverse effects. I recommended for her to proceed with cycle #2 today as planned. For the hypertension, we will give the patient a dose of clonidine 0.1 mg p.o. x1 today.  She was advised to discuss with her primary care physician treatment for her hypertension. The patient will come back for follow-up visit in 4 weeks for evaluation before the next cycle of her treatment. She was advised to call immediately if she has any concerning symptoms in the interval. The patient voices understanding of current disease status and treatment options and is in agreement with the current care plan. All questions were answered. The patient knows to call the clinic with any problems, questions or concerns. We can certainly see the patient much sooner if necessary.  Disclaimer: This note was dictated with voice recognition software. Similar sounding words can  inadvertently be transcribed and may not be corrected upon review.

## 2019-06-09 NOTE — Patient Instructions (Signed)
[facility] Discharge Instructions for Patients Receiving Chemotherapy  Today you received the following chemotherapy agents: carfilzomib and cyclophosphamide.  To help prevent nausea and vomiting after your treatment, we encourage you to take your nausea medication as directed.   If you develop nausea and vomiting that is not controlled by your nausea medication, call the clinic.   BELOW ARE SYMPTOMS THAT SHOULD BE REPORTED IMMEDIATELY:  *FEVER GREATER THAN 100.5 F  *CHILLS WITH OR WITHOUT FEVER  NAUSEA AND VOMITING THAT IS NOT CONTROLLED WITH YOUR NAUSEA MEDICATION  *UNUSUAL SHORTNESS OF BREATH  *UNUSUAL BRUISING OR BLEEDING  TENDERNESS IN MOUTH AND THROAT WITH OR WITHOUT PRESENCE OF ULCERS  *URINARY PROBLEMS  *BOWEL PROBLEMS  UNUSUAL RASH Items with * indicate a potential emergency and should be followed up as soon as possible.  Feel free to call the clinic should you have any questions or concerns. The clinic phone number is [phone].  Please show the CHEMO ALERT CARD at check-in to the Emergency Department and triage nurse.   

## 2019-06-10 ENCOUNTER — Other Ambulatory Visit: Payer: Self-pay

## 2019-06-10 ENCOUNTER — Inpatient Hospital Stay: Payer: Self-pay

## 2019-06-10 VITALS — BP 146/85 | HR 73 | Temp 98.2°F | Resp 18

## 2019-06-10 DIAGNOSIS — C9000 Multiple myeloma not having achieved remission: Secondary | ICD-10-CM

## 2019-06-10 MED ORDER — DEXTROSE 5 % IV SOLN
37.0000 mg/m2 | Freq: Once | INTRAVENOUS | Status: AC
  Administered 2019-06-10: 80 mg via INTRAVENOUS
  Filled 2019-06-10: qty 30

## 2019-06-10 MED ORDER — SODIUM CHLORIDE 0.9 % IV SOLN
Freq: Once | INTRAVENOUS | Status: AC
  Filled 2019-06-10: qty 250

## 2019-06-10 MED ORDER — HEPARIN SOD (PORK) LOCK FLUSH 100 UNIT/ML IV SOLN
500.0000 [IU] | Freq: Once | INTRAVENOUS | Status: AC | PRN
  Administered 2019-06-10: 500 [IU]
  Filled 2019-06-10: qty 5

## 2019-06-10 MED ORDER — SODIUM CHLORIDE 0.9 % IV SOLN
10.0000 mg | Freq: Once | INTRAVENOUS | Status: AC
  Administered 2019-06-10: 10 mg via INTRAVENOUS
  Filled 2019-06-10: qty 10

## 2019-06-10 MED ORDER — DEXAMETHASONE SODIUM PHOSPHATE 10 MG/ML IJ SOLN: 10.0000 mg | Freq: Once | INTRAMUSCULAR | Status: DC

## 2019-06-10 MED ORDER — SODIUM CHLORIDE 0.9% FLUSH
10.0000 mL | INTRAVENOUS | Status: DC | PRN
  Administered 2019-06-10: 10 mL
  Filled 2019-06-10: qty 10

## 2019-06-10 NOTE — Progress Notes (Signed)
Pharmacist Chemotherapy Monitoring - Follow Up Assessment    I verify that I have reviewed each item in the below checklist:  . Regimen for the patient is scheduled for the appropriate day and plan matches scheduled date. Marland Kitchen Appropriate non-routine labs are ordered dependent on drug ordered. . If applicable, additional medications reviewed and ordered per protocol based on lifetime cumulative doses and/or treatment regimen.   Plan for follow-up and/or issues identified: No . I-vent associated with next due treatment: No . MD and/or nursing notified: No  Dawnelle Warman K 06/10/2019 8:40 AM

## 2019-06-10 NOTE — Patient Instructions (Signed)
[facility] Discharge Instructions for Patients Receiving Chemotherapy  Today you received the following chemotherapy agents: carfilzomib.  To help prevent nausea and vomiting after your treatment, we encourage you to take your nausea medication as directed.   If you develop nausea and vomiting that is not controlled by your nausea medication, call the clinic.   BELOW ARE SYMPTOMS THAT SHOULD BE REPORTED IMMEDIATELY:  *FEVER GREATER THAN 100.5 F  *CHILLS WITH OR WITHOUT FEVER  NAUSEA AND VOMITING THAT IS NOT CONTROLLED WITH YOUR NAUSEA MEDICATION  *UNUSUAL SHORTNESS OF BREATH  *UNUSUAL BRUISING OR BLEEDING  TENDERNESS IN MOUTH AND THROAT WITH OR WITHOUT PRESENCE OF ULCERS  *URINARY PROBLEMS  *BOWEL PROBLEMS  UNUSUAL RASH Items with * indicate a potential emergency and should be followed up as soon as possible.  Feel free to call the clinic should you have any questions or concerns. The clinic phone number is [phone].  Please show the CHEMO ALERT CARD at check-in to the Emergency Department and triage nurse.   

## 2019-06-13 NOTE — Progress Notes (Signed)
Pharmacist Chemotherapy Monitoring - Follow Up Assessment    I verify that I have reviewed each item in the below checklist:  . Regimen for the patient is scheduled for the appropriate day and plan matches scheduled date. Marland Kitchen Appropriate non-routine labs are ordered dependent on drug ordered. . If applicable, additional medications reviewed and ordered per protocol based on lifetime cumulative doses and/or treatment regimen.   Plan for follow-up and/or issues identified: No . I-vent associated with next due treatment: No . MD and/or nursing notified: No  Debbie Chan [facility] 06/13/2019 1:38 PM

## 2019-06-16 ENCOUNTER — Inpatient Hospital Stay: Payer: Self-pay

## 2019-06-16 ENCOUNTER — Other Ambulatory Visit: Payer: Self-pay

## 2019-06-16 ENCOUNTER — Other Ambulatory Visit: Payer: Self-pay | Admitting: Internal Medicine

## 2019-06-16 VITALS — BP 158/72 | HR 76 | Temp 97.8°F | Resp 18 | Wt 203.8 lb

## 2019-06-16 DIAGNOSIS — C9000 Multiple myeloma not having achieved remission: Secondary | ICD-10-CM

## 2019-06-16 LAB — CBC WITH DIFFERENTIAL (CANCER CENTER ONLY)
Abs Immature Granulocytes: 0.02 10*3/uL (ref 0.00–0.07)
Basophils Absolute: 0 10*3/uL (ref 0.0–0.1)
Basophils Relative: 0 %
Eosinophils Absolute: 0 10*3/uL (ref 0.0–0.5)
Eosinophils Relative: 0 %
HCT: 36.2 % (ref 36.0–46.0)
Hemoglobin: 12 g/dL (ref 12.0–15.0)
Immature Granulocytes: 0 %
Lymphocytes Relative: 8 %
Lymphs Abs: 0.5 10*3/uL — ABNORMAL LOW (ref 0.7–4.0)
MCH: 30.9 pg (ref 26.0–34.0)
MCHC: 33.1 g/dL (ref 30.0–36.0)
MCV: 93.3 fL (ref 80.0–100.0)
Monocytes Absolute: 0.2 10*3/uL (ref 0.1–1.0)
Monocytes Relative: 3 %
Neutro Abs: 5.2 10*3/uL (ref 1.7–7.7)
Neutrophils Relative %: 89 %
Platelet Count: 67 10*3/uL — ABNORMAL LOW (ref 150–400)
RBC: 3.88 MIL/uL (ref 3.87–5.11)
RDW: 15.7 % — ABNORMAL HIGH (ref 11.5–15.5)
WBC Count: 5.9 10*3/uL (ref 4.0–10.5)
nRBC: 0.3 % — ABNORMAL HIGH (ref 0.0–0.2)

## 2019-06-16 LAB — CMP (CANCER CENTER ONLY)
ALT: 13 U/L (ref 0–44)
AST: 17 U/L (ref 15–41)
Albumin: 3.7 g/dL (ref 3.5–5.0)
Alkaline Phosphatase: 93 U/L (ref 38–126)
Anion gap: 11 (ref 5–15)
BUN: 21 mg/dL (ref 8–23)
CO2: 23 mmol/L (ref 22–32)
Calcium: 9.1 mg/dL (ref 8.9–10.3)
Chloride: 107 mmol/L (ref 98–111)
Creatinine: 1.62 mg/dL — ABNORMAL HIGH (ref 0.44–1.00)
GFR, Est AFR Am: 37 mL/min — ABNORMAL LOW (ref >60)
GFR, Estimated: 32 mL/min — ABNORMAL LOW (ref >60)
Glucose, Bld: 96 mg/dL (ref 70–99)
Potassium: 4.6 mmol/L (ref 3.5–5.1)
Sodium: 141 mmol/L (ref 135–145)
Total Bilirubin: 0.4 mg/dL (ref 0.3–1.2)
Total Protein: 7.1 g/dL (ref 6.5–8.1)

## 2019-06-16 MED ORDER — HEPARIN SOD (PORK) LOCK FLUSH 100 UNIT/ML IV SOLN
500.0000 [IU] | Freq: Once | INTRAVENOUS | Status: AC | PRN
  Administered 2019-06-16: 13:00:00 500 [IU]
  Filled 2019-06-16: qty 5

## 2019-06-16 MED ORDER — PALONOSETRON HCL INJECTION 0.25 MG/5ML
INTRAVENOUS | Status: AC
  Filled 2019-06-16: qty 5

## 2019-06-16 MED ORDER — SODIUM CHLORIDE 0.9 % IV SOLN
INTRAVENOUS | Status: DC
  Filled 2019-06-16: qty 250

## 2019-06-16 MED ORDER — SODIUM CHLORIDE 0.9 % IV SOLN
300.0000 mg/m2 | Freq: Once | INTRAVENOUS | Status: AC
  Administered 2019-06-16: 12:00:00 640 mg via INTRAVENOUS
  Filled 2019-06-16: qty 32

## 2019-06-16 MED ORDER — SODIUM CHLORIDE 0.9% FLUSH
10.0000 mL | INTRAVENOUS | Status: DC | PRN
  Administered 2019-06-16: 13:00:00 10 mL
  Filled 2019-06-16: qty 10

## 2019-06-16 MED ORDER — SODIUM CHLORIDE 0.9 % IV SOLN
Freq: Once | INTRAVENOUS | Status: DC
  Filled 2019-06-16: qty 250

## 2019-06-16 MED ORDER — SODIUM CHLORIDE 0.9 % IV SOLN
Freq: Once | INTRAVENOUS | Status: AC
  Filled 2019-06-16: qty 250

## 2019-06-16 MED ORDER — DEXTROSE 5 % IV SOLN
37.0000 mg/m2 | Freq: Once | INTRAVENOUS | Status: AC
  Administered 2019-06-16: 11:00:00 80 mg via INTRAVENOUS
  Filled 2019-06-16: qty 30

## 2019-06-16 MED ORDER — PALONOSETRON HCL INJECTION 0.25 MG/5ML
0.2500 mg | Freq: Once | INTRAVENOUS | Status: AC
  Administered 2019-06-16: 11:00:00 0.25 mg via INTRAVENOUS

## 2019-06-16 NOTE — Patient Instructions (Signed)

## 2019-06-16 NOTE — Progress Notes (Signed)
Okay to treat with todays labs per Dr. Julien Nordmann. Will add an additional 500 NS to treatment today due to elevated creat. Per verbal order. Patient also stated that she has been having right hip/thigh pain when she walks and she will have to rest to ease the pain. She is not taking any medications to manage the pain at this time. Dr. Julien Nordmann made aware as well.

## 2019-06-17 ENCOUNTER — Inpatient Hospital Stay: Payer: Self-pay

## 2019-06-17 ENCOUNTER — Other Ambulatory Visit: Payer: Self-pay

## 2019-06-17 VITALS — BP 156/78 | HR 70 | Temp 98.3°F | Resp 17 | Ht 68.0 in | Wt 208.1 lb

## 2019-06-17 DIAGNOSIS — C9000 Multiple myeloma not having achieved remission: Secondary | ICD-10-CM

## 2019-06-17 MED ORDER — DEXTROSE 5 % IV SOLN
37.0000 mg/m2 | Freq: Once | INTRAVENOUS | Status: AC
  Administered 2019-06-17: 10:00:00 80 mg via INTRAVENOUS
  Filled 2019-06-17: qty 30

## 2019-06-17 MED ORDER — DEXAMETHASONE SODIUM PHOSPHATE 10 MG/ML IJ SOLN
INTRAMUSCULAR | Status: AC
  Filled 2019-06-17: qty 1

## 2019-06-17 MED ORDER — SODIUM CHLORIDE 0.9 % IV SOLN
Freq: Once | INTRAVENOUS | Status: AC
  Filled 2019-06-17: qty 250

## 2019-06-17 MED ORDER — SODIUM CHLORIDE 0.9% FLUSH
10.0000 mL | INTRAVENOUS | Status: DC | PRN
  Administered 2019-06-17: 10 mL
  Filled 2019-06-17: qty 10

## 2019-06-17 MED ORDER — SODIUM CHLORIDE 0.9 % IV SOLN
10.0000 mg | Freq: Once | INTRAVENOUS | Status: AC
  Administered 2019-06-17: 09:00:00 10 mg via INTRAVENOUS
  Filled 2019-06-17: qty 10

## 2019-06-17 MED ORDER — DEXAMETHASONE SODIUM PHOSPHATE 10 MG/ML IJ SOLN: 10.0000 mg | Freq: Once | INTRAMUSCULAR | Status: DC

## 2019-06-17 MED ORDER — HEPARIN SOD (PORK) LOCK FLUSH 100 UNIT/ML IV SOLN
500.0000 [IU] | Freq: Once | INTRAVENOUS | Status: AC | PRN
  Administered 2019-06-17: 11:00:00 500 [IU]
  Filled 2019-06-17: qty 5

## 2019-06-17 NOTE — Patient Instructions (Signed)
[facility] Discharge Instructions for Patients Receiving Chemotherapy  Today you received the following chemotherapy agent: Carfilzomib (Kyprolis)  To help prevent nausea and vomiting after your treatment, we encourage you to take your nausea medication as directed by your MD.   If you develop nausea and vomiting that is not controlled by your nausea medication, call the clinic.   BELOW ARE SYMPTOMS THAT SHOULD BE REPORTED IMMEDIATELY:  *FEVER GREATER THAN 100.5 F  *CHILLS WITH OR WITHOUT FEVER  NAUSEA AND VOMITING THAT IS NOT CONTROLLED WITH YOUR NAUSEA MEDICATION  *UNUSUAL SHORTNESS OF BREATH  *UNUSUAL BRUISING OR BLEEDING  TENDERNESS IN MOUTH AND THROAT WITH OR WITHOUT PRESENCE OF ULCERS  *URINARY PROBLEMS  *BOWEL PROBLEMS  UNUSUAL RASH Items with * indicate a potential emergency and should be followed up as soon as possible.  Feel free to call the clinic should you have any questions or concerns. The clinic phone number is [phone].  Please show the [place] at check-in to the Emergency Department and triage nurse.  Coronavirus (COVID-19) Are you at risk?  Are you at risk for the Coronavirus (COVID-19)?  To be considered HIGH RISK for Coronavirus (COVID-19), you have to meet the following criteria:  . Traveled to [place], [place], [place], [place] or [place]; or in the [place] to [place], [place], [place], or [place]; and have fever, cough, and shortness of breath within the last 2 weeks of travel OR . Been in close contact with a person diagnosed with COVID-19 within the last 2 weeks and have fever, cough, and shortness of breath . IF YOU DO NOT MEET THESE CRITERIA, YOU ARE CONSIDERED LOW RISK FOR COVID-19.  What to do if you are HIGH RISK for COVID-19?  Marland Kitchen If you are having a medical emergency, call 911. . Seek medical care right away. Before you go to a doctor's office, urgent care or emergency department, call ahead and  tell them about your recent travel, contact with someone diagnosed with COVID-19, and your symptoms. You should receive instructions from your physician's office regarding next steps of care.  . When you arrive at healthcare provider, tell the healthcare staff immediately you have returned from visiting [place], [place], [place], [place] or [place]; or traveled in the [place] to [place], [place], [place], or [place]; in the last two weeks or you have been in close contact with a person diagnosed with COVID-19 in the last 2 weeks.   . Tell the health care staff about your symptoms: fever, cough and shortness of breath. . After you have been seen by a medical provider, you will be either: o Tested for (COVID-19) and discharged home on quarantine except to seek medical care if symptoms worsen, and asked to  - Stay home and avoid contact with others until you get your results (4-5 days)  - Avoid travel on public transportation if possible (such as bus, train, or airplane) or o Sent to the Emergency Department by EMS for evaluation, COVID-19 testing, and possible admission depending on your condition and test results.  What to do if you are LOW RISK for COVID-19?  Reduce your risk of any infection by using the same precautions used for avoiding the common cold or flu:  Marland Kitchen Wash your hands often with soap and warm water for at least 20 seconds.  If soap and water are not readily available, use an alcohol-based hand sanitizer with at least 60% alcohol.  Marland Kitchen  If coughing or sneezing, cover your mouth and nose by coughing or sneezing into the elbow areas of your shirt or coat, into a tissue or into your sleeve (not your hands). . Avoid shaking hands with others and consider head nods or verbal greetings only. . Avoid touching your eyes, nose, or mouth with unwashed hands.  . Avoid close contact with people who are sick. . Avoid places or events with large numbers of people in one location, like  concerts or sporting events. . Carefully consider travel plans you have or are making. . If you are planning any travel outside or inside the [place], visit the CDC's Travelers' Health webpage for the latest health notices. . If you have some symptoms but not all symptoms, continue to monitor at home and seek medical attention if your symptoms worsen. . If you are having a medical emergency, call 911.   ADDITIONAL HEALTHCARE OPTIONS FOR PATIENTS  [facility] / e-Visit: [email or web address]         [facility]: [phone]  [facility]: [phone]                   [facility]: [phone]  

## 2019-06-17 NOTE — Progress Notes (Signed)
Pharmacist Chemotherapy Monitoring - Follow Up Assessment    I verify that I have reviewed each item in the below checklist:  . Regimen for the patient is scheduled for the appropriate day and plan matches scheduled date. Marland Kitchen Appropriate non-routine labs are ordered dependent on drug ordered. . If applicable, additional medications reviewed and ordered per protocol based on lifetime cumulative doses and/or treatment regimen.   Plan for follow-up and/or issues identified: No . I-vent associated with next due treatment: No . MD and/or nursing notified: No  Lopez Dentinger D 06/17/2019 4:30 PM

## 2019-06-23 ENCOUNTER — Inpatient Hospital Stay: Payer: Self-pay

## 2019-06-23 ENCOUNTER — Other Ambulatory Visit: Payer: Self-pay

## 2019-06-23 ENCOUNTER — Telehealth: Payer: Self-pay | Admitting: Medical Oncology

## 2019-06-23 ENCOUNTER — Other Ambulatory Visit: Payer: Self-pay | Admitting: Medical Oncology

## 2019-06-23 VITALS — BP 148/60 | HR 70 | Temp 98.0°F | Resp 18 | Wt 202.2 lb

## 2019-06-23 DIAGNOSIS — C9000 Multiple myeloma not having achieved remission: Secondary | ICD-10-CM

## 2019-06-23 DIAGNOSIS — T7840XA Allergy, unspecified, initial encounter: Secondary | ICD-10-CM

## 2019-06-23 LAB — CMP (CANCER CENTER ONLY)
ALT: 12 U/L (ref 0–44)
AST: 18 U/L (ref 15–41)
Albumin: 3.5 g/dL (ref 3.5–5.0)
Alkaline Phosphatase: 83 U/L (ref 38–126)
Anion gap: 9 (ref 5–15)
BUN: 13 mg/dL (ref 8–23)
CO2: 25 mmol/L (ref 22–32)
Calcium: 9 mg/dL (ref 8.9–10.3)
Chloride: 108 mmol/L (ref 98–111)
Creatinine: 1.34 mg/dL — ABNORMAL HIGH (ref 0.44–1.00)
GFR, Est AFR Am: 47 mL/min — ABNORMAL LOW (ref >60)
GFR, Estimated: 40 mL/min — ABNORMAL LOW (ref >60)
Glucose, Bld: 93 mg/dL (ref 70–99)
Potassium: 4.6 mmol/L (ref 3.5–5.1)
Sodium: 142 mmol/L (ref 135–145)
Total Bilirubin: 0.4 mg/dL (ref 0.3–1.2)
Total Protein: 6.7 g/dL (ref 6.5–8.1)

## 2019-06-23 LAB — CBC WITH DIFFERENTIAL (CANCER CENTER ONLY)
Abs Immature Granulocytes: 0.01 10*3/uL (ref 0.00–0.07)
Basophils Absolute: 0 10*3/uL (ref 0.0–0.1)
Basophils Relative: 0 %
Eosinophils Absolute: 0.1 10*3/uL (ref 0.0–0.5)
Eosinophils Relative: 2 %
HCT: 33.8 % — ABNORMAL LOW (ref 36.0–46.0)
Hemoglobin: 11.2 g/dL — ABNORMAL LOW (ref 12.0–15.0)
Immature Granulocytes: 0 %
Lymphocytes Relative: 16 %
Lymphs Abs: 0.5 10*3/uL — ABNORMAL LOW (ref 0.7–4.0)
MCH: 31.2 pg (ref 26.0–34.0)
MCHC: 33.1 g/dL (ref 30.0–36.0)
MCV: 94.2 fL (ref 80.0–100.0)
Monocytes Absolute: 0.2 10*3/uL (ref 0.1–1.0)
Monocytes Relative: 7 %
Neutro Abs: 2.4 10*3/uL (ref 1.7–7.7)
Neutrophils Relative %: 75 %
Platelet Count: 79 10*3/uL — ABNORMAL LOW (ref 150–400)
RBC: 3.59 MIL/uL — ABNORMAL LOW (ref 3.87–5.11)
RDW: 15.5 % (ref 11.5–15.5)
WBC Count: 3.3 10*3/uL — ABNORMAL LOW (ref 4.0–10.5)
nRBC: 0 % (ref 0.0–0.2)

## 2019-06-23 MED ORDER — HEPARIN SOD (PORK) LOCK FLUSH 100 UNIT/ML IV SOLN
500.0000 [IU] | Freq: Once | INTRAVENOUS | Status: AC | PRN
  Administered 2019-06-23: 500 [IU]
  Filled 2019-06-23: qty 5

## 2019-06-23 MED ORDER — PALONOSETRON HCL INJECTION 0.25 MG/5ML
0.2500 mg | Freq: Once | INTRAVENOUS | Status: AC
  Administered 2019-06-23: 0.25 mg via INTRAVENOUS

## 2019-06-23 MED ORDER — DEXTROSE 5 % IV SOLN
37.0000 mg/m2 | Freq: Once | INTRAVENOUS | Status: AC
  Administered 2019-06-23: 80 mg via INTRAVENOUS
  Filled 2019-06-23: qty 30

## 2019-06-23 MED ORDER — SODIUM CHLORIDE 0.9% FLUSH
10.0000 mL | INTRAVENOUS | Status: DC | PRN
  Administered 2019-06-23: 10 mL
  Filled 2019-06-23: qty 10

## 2019-06-23 MED ORDER — PALONOSETRON HCL INJECTION 0.25 MG/5ML
INTRAVENOUS | Status: AC
  Filled 2019-06-23: qty 5

## 2019-06-23 MED ORDER — SODIUM CHLORIDE 0.9 % IV SOLN
Freq: Once | INTRAVENOUS | Status: AC
  Filled 2019-06-23: qty 250

## 2019-06-23 MED ORDER — SODIUM CHLORIDE 0.9 % IV SOLN
300.0000 mg/m2 | Freq: Once | INTRAVENOUS | Status: AC
  Administered 2019-06-23: 640 mg via INTRAVENOUS
  Filled 2019-06-23: qty 32

## 2019-06-23 MED FILL — DEXAMETHASONE 4 MG TABLET: 4 | 30 days supply | Qty: 40 | Fill #2

## 2019-06-23 NOTE — Patient Instructions (Signed)
[facility] Discharge Instructions for Patients Receiving Chemotherapy  Today you received the following chemotherapy agents: cyclophosphamide and carfilzomib.  To help prevent nausea and vomiting after your treatment, we encourage you to take your nausea medication as directed.   If you develop nausea and vomiting that is not controlled by your nausea medication, call the clinic.   BELOW ARE SYMPTOMS THAT SHOULD BE REPORTED IMMEDIATELY:  *FEVER GREATER THAN 100.5 F  *CHILLS WITH OR WITHOUT FEVER  NAUSEA AND VOMITING THAT IS NOT CONTROLLED WITH YOUR NAUSEA MEDICATION  *UNUSUAL SHORTNESS OF BREATH  *UNUSUAL BRUISING OR BLEEDING  TENDERNESS IN MOUTH AND THROAT WITH OR WITHOUT PRESENCE OF ULCERS  *URINARY PROBLEMS  *BOWEL PROBLEMS  UNUSUAL RASH Items with * indicate a potential emergency and should be followed up as soon as possible.  Feel free to call the clinic should you have any questions or concerns. The clinic phone number is [phone].  Please show the [facility] at check-in to the Emergency Department and triage nurse.

## 2019-06-23 NOTE — Telephone Encounter (Signed)
Requests decadron refill. She has 3 refills from march so I asked them to get it ready for her.

## 2019-06-23 NOTE — Progress Notes (Signed)
Per Dr Julien Nordmann, ok to treat with platelets 79 today.

## 2019-06-24 ENCOUNTER — Telehealth: Payer: Self-pay | Admitting: Medical Oncology

## 2019-06-24 ENCOUNTER — Inpatient Hospital Stay: Payer: Self-pay

## 2019-06-24 ENCOUNTER — Other Ambulatory Visit: Payer: Self-pay

## 2019-06-24 VITALS — BP 153/76 | HR 72 | Temp 98.9°F | Resp 18

## 2019-06-24 DIAGNOSIS — C9000 Multiple myeloma not having achieved remission: Secondary | ICD-10-CM

## 2019-06-24 MED ORDER — DEXTROSE 5 % IV SOLN
37.0000 mg/m2 | Freq: Once | INTRAVENOUS | Status: AC
  Administered 2019-06-24: 80 mg via INTRAVENOUS
  Filled 2019-06-24: qty 30

## 2019-06-24 MED ORDER — DEXAMETHASONE SODIUM PHOSPHATE 10 MG/ML IJ SOLN: 10.0000 mg | Freq: Once | INTRAMUSCULAR | Status: DC

## 2019-06-24 MED ORDER — HEPARIN SOD (PORK) LOCK FLUSH 100 UNIT/ML IV SOLN
500.0000 [IU] | Freq: Once | INTRAVENOUS | Status: AC | PRN
  Administered 2019-06-24: 500 [IU]
  Filled 2019-06-24: qty 5

## 2019-06-24 MED ORDER — SODIUM CHLORIDE 0.9 % IV SOLN
10.0000 mg | Freq: Once | INTRAVENOUS | Status: AC
  Administered 2019-06-24: 10 mg via INTRAVENOUS
  Filled 2019-06-24: qty 10

## 2019-06-24 MED ORDER — SODIUM CHLORIDE 0.9% FLUSH
10.0000 mL | INTRAVENOUS | Status: DC | PRN
  Administered 2019-06-24: 10 mL
  Filled 2019-06-24: qty 10

## 2019-06-24 MED ORDER — SODIUM CHLORIDE 0.9 % IV SOLN
Freq: Once | INTRAVENOUS | Status: AC
  Filled 2019-06-24: qty 250

## 2019-06-24 NOTE — Telephone Encounter (Signed)
LM that decadron can be picked up at [facility].

## 2019-06-24 NOTE — Patient Instructions (Signed)
[facility] Discharge Instructions for Patients Receiving Chemotherapy  Today you received the following chemotherapy agents: carfilzomib.  To help prevent nausea and vomiting after your treatment, we encourage you to take your nausea medication as directed.   If you develop nausea and vomiting that is not controlled by your nausea medication, call the clinic.   BELOW ARE SYMPTOMS THAT SHOULD BE REPORTED IMMEDIATELY:  *FEVER GREATER THAN 100.5 F  *CHILLS WITH OR WITHOUT FEVER  NAUSEA AND VOMITING THAT IS NOT CONTROLLED WITH YOUR NAUSEA MEDICATION  *UNUSUAL SHORTNESS OF BREATH  *UNUSUAL BRUISING OR BLEEDING  TENDERNESS IN MOUTH AND THROAT WITH OR WITHOUT PRESENCE OF ULCERS  *URINARY PROBLEMS  *BOWEL PROBLEMS  UNUSUAL RASH Items with * indicate a potential emergency and should be followed up as soon as possible.  Feel free to call the clinic should you have any questions or concerns. The clinic phone number is [phone].  Please show the CHEMO ALERT CARD at check-in to the Emergency Department and triage nurse.   

## 2019-07-01 ENCOUNTER — Telehealth: Payer: Self-pay | Admitting: *Deleted

## 2019-07-01 NOTE — Telephone Encounter (Signed)
Received call from pt's son. He states that his mother's grant at [employer] has no more funds left in it. He is calling to see what else can be done to help her financially as she does not have any insurance.

## 2019-07-04 NOTE — Progress Notes (Signed)
Pharmacist Chemotherapy Monitoring - Follow Up Assessment    I verify that I have reviewed each item in the below checklist:  . Regimen for the patient is scheduled for the appropriate day and plan matches scheduled date. Marland Kitchen Appropriate non-routine labs are ordered dependent on drug ordered. . If applicable, additional medications reviewed and ordered per protocol based on lifetime cumulative doses and/or treatment regimen.   Plan for follow-up and/or issues identified: No . I-vent associated with next due treatment: No . MD and/or nursing notified: No  Lorelie Biermann K 07/04/2019 9:11 AM

## 2019-07-05 ENCOUNTER — Other Ambulatory Visit: Payer: Self-pay | Admitting: Medical Oncology

## 2019-07-07 ENCOUNTER — Other Ambulatory Visit: Payer: Self-pay

## 2019-07-07 ENCOUNTER — Inpatient Hospital Stay: Payer: Self-pay

## 2019-07-07 ENCOUNTER — Other Ambulatory Visit: Payer: Self-pay | Admitting: *Deleted

## 2019-07-07 ENCOUNTER — Encounter: Payer: Self-pay | Admitting: Internal Medicine

## 2019-07-07 ENCOUNTER — Inpatient Hospital Stay: Payer: Self-pay | Attending: Internal Medicine | Admitting: Internal Medicine

## 2019-07-07 VITALS — BP 140/68 | HR 57 | Temp 98.3°F | Resp 17 | Ht 68.0 in | Wt 203.1 lb

## 2019-07-07 DIAGNOSIS — R21 Rash and other nonspecific skin eruption: Secondary | ICD-10-CM | POA: Insufficient documentation

## 2019-07-07 DIAGNOSIS — Z5111 Encounter for antineoplastic chemotherapy: Secondary | ICD-10-CM | POA: Insufficient documentation

## 2019-07-07 DIAGNOSIS — Z79899 Other long term (current) drug therapy: Secondary | ICD-10-CM | POA: Insufficient documentation

## 2019-07-07 DIAGNOSIS — Z5112 Encounter for antineoplastic immunotherapy: Secondary | ICD-10-CM | POA: Insufficient documentation

## 2019-07-07 DIAGNOSIS — C9000 Multiple myeloma not having achieved remission: Secondary | ICD-10-CM

## 2019-07-07 DIAGNOSIS — Z95828 Presence of other vascular implants and grafts: Secondary | ICD-10-CM

## 2019-07-07 DIAGNOSIS — G629 Polyneuropathy, unspecified: Secondary | ICD-10-CM | POA: Insufficient documentation

## 2019-07-07 DIAGNOSIS — K219 Gastro-esophageal reflux disease without esophagitis: Secondary | ICD-10-CM

## 2019-07-07 LAB — CBC WITH DIFFERENTIAL (CANCER CENTER ONLY)
Abs Immature Granulocytes: 0.01 10*3/uL (ref 0.00–0.07)
Basophils Absolute: 0 10*3/uL (ref 0.0–0.1)
Basophils Relative: 1 %
Eosinophils Absolute: 0 10*3/uL (ref 0.0–0.5)
Eosinophils Relative: 1 %
HCT: 36.3 % (ref 36.0–46.0)
Hemoglobin: 11.9 g/dL — ABNORMAL LOW (ref 12.0–15.0)
Immature Granulocytes: 0 %
Lymphocytes Relative: 20 %
Lymphs Abs: 0.8 10*3/uL (ref 0.7–4.0)
MCH: 31.2 pg (ref 26.0–34.0)
MCHC: 32.8 g/dL (ref 30.0–36.0)
MCV: 95 fL (ref 80.0–100.0)
Monocytes Absolute: 0.2 10*3/uL (ref 0.1–1.0)
Monocytes Relative: 4 %
Neutro Abs: 3 10*3/uL (ref 1.7–7.7)
Neutrophils Relative %: 74 %
Platelet Count: 160 10*3/uL (ref 150–400)
RBC: 3.82 MIL/uL — ABNORMAL LOW (ref 3.87–5.11)
RDW: 14.6 % (ref 11.5–15.5)
WBC Count: 3.9 10*3/uL — ABNORMAL LOW (ref 4.0–10.5)
nRBC: 0 % (ref 0.0–0.2)

## 2019-07-07 LAB — CMP (CANCER CENTER ONLY)
ALT: 12 U/L (ref 0–44)
AST: 17 U/L (ref 15–41)
Albumin: 3.6 g/dL (ref 3.5–5.0)
Alkaline Phosphatase: 84 U/L (ref 38–126)
Anion gap: 9 (ref 5–15)
BUN: 15 mg/dL (ref 8–23)
CO2: 25 mmol/L (ref 22–32)
Calcium: 8.9 mg/dL (ref 8.9–10.3)
Chloride: 107 mmol/L (ref 98–111)
Creatinine: 1.36 mg/dL — ABNORMAL HIGH (ref 0.44–1.00)
GFR, Est AFR Am: 46 mL/min — ABNORMAL LOW (ref >60)
GFR, Estimated: 40 mL/min — ABNORMAL LOW (ref >60)
Glucose, Bld: 105 mg/dL — ABNORMAL HIGH (ref 70–99)
Potassium: 4.5 mmol/L (ref 3.5–5.1)
Sodium: 141 mmol/L (ref 135–145)
Total Bilirubin: 0.4 mg/dL (ref 0.3–1.2)
Total Protein: 6.8 g/dL (ref 6.5–8.1)

## 2019-07-07 MED ORDER — HEPARIN SOD (PORK) LOCK FLUSH 100 UNIT/ML IV SOLN
500.0000 [IU] | Freq: Once | INTRAVENOUS | Status: AC | PRN
  Administered 2019-07-07: 500 [IU]
  Filled 2019-07-07: qty 5

## 2019-07-07 MED ORDER — DEXTROSE 5 % IV SOLN
37.0000 mg/m2 | Freq: Once | INTRAVENOUS | Status: AC
  Administered 2019-07-07: 80 mg via INTRAVENOUS
  Filled 2019-07-07: qty 30

## 2019-07-07 MED ORDER — ACYCLOVIR 200 MG PO CAPS: 200.0000 mg | ORAL_CAPSULE | Freq: Two times a day (BID) | ORAL | 2 refills | Status: DC

## 2019-07-07 MED ORDER — OMEPRAZOLE 20 MG PO CPDR: 20.0000 mg | DELAYED_RELEASE_CAPSULE | Freq: Every day | ORAL | 3 refills | Status: DC

## 2019-07-07 MED ORDER — PALONOSETRON HCL INJECTION 0.25 MG/5ML
0.2500 mg | Freq: Once | INTRAVENOUS | Status: AC
  Administered 2019-07-07: 0.25 mg via INTRAVENOUS

## 2019-07-07 MED ORDER — SODIUM CHLORIDE 0.9% FLUSH
10.0000 mL | INTRAVENOUS | Status: DC | PRN
  Administered 2019-07-07: 10 mL
  Filled 2019-07-07: qty 10

## 2019-07-07 MED ORDER — SODIUM CHLORIDE 0.9 % IV SOLN
300.0000 mg/m2 | Freq: Once | INTRAVENOUS | Status: AC
  Administered 2019-07-07: 640 mg via INTRAVENOUS
  Filled 2019-07-07: qty 32

## 2019-07-07 MED ORDER — SODIUM CHLORIDE 0.9 % IV SOLN
Freq: Once | INTRAVENOUS | Status: AC
  Filled 2019-07-07: qty 250

## 2019-07-07 MED ORDER — PALONOSETRON HCL INJECTION 0.25 MG/5ML
INTRAVENOUS | Status: AC
  Filled 2019-07-07: qty 5

## 2019-07-07 NOTE — Patient Instructions (Signed)
[facility] Discharge Instructions for Patients Receiving Chemotherapy  Today you received the following chemotherapy agents Carfilzomib (KYPROLIS) & Cyclophosphamide (CYTOXAN).  To help prevent nausea and vomiting after your treatment, we encourage you to take your nausea medication as prescribed.   If you develop nausea and vomiting that is not controlled by your nausea medication, call the clinic.   BELOW ARE SYMPTOMS THAT SHOULD BE REPORTED IMMEDIATELY:  *FEVER GREATER THAN 100.5 F  *CHILLS WITH OR WITHOUT FEVER  NAUSEA AND VOMITING THAT IS NOT CONTROLLED WITH YOUR NAUSEA MEDICATION  *UNUSUAL SHORTNESS OF BREATH  *UNUSUAL BRUISING OR BLEEDING  TENDERNESS IN MOUTH AND THROAT WITH OR WITHOUT PRESENCE OF ULCERS  *URINARY PROBLEMS  *BOWEL PROBLEMS  UNUSUAL RASH Items with * indicate a potential emergency and should be followed up as soon as possible.  Feel free to call the clinic should you have any questions or concerns. The clinic phone number is [phone].  Please show the [facility] at check-in to the Emergency Department and triage nurse.

## 2019-07-07 NOTE — Progress Notes (Signed)
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  PRIOR THERAPY:  1)Systemic therapy with Velcade 1.3 mg/M2 weekly, Revlimid 25 mg p.o. daily for 14 days every 3 weeks in addition to weekly Decadron 40 mg orally. First dose of treatment 10/08/2017.Treatment was placed on hold due to development of a significant rash.Sheresumedtreatment withonlydexamethasone and Velcade on 11/19/2017.This was discontinued on 04/14/2019 due to evidence of disease progression. 2)Pomalyst 4 mg p.o. for 21 days every 4 weeks and 40 mg p.o. Decadron once a week. First dose3/06/2019-05/04/2019.Status post 6 days of treatment. Discontinued secondary to allergy to Pomalyst.  CURRENT THERAPY: Chemotherapy with carfilzomibon days 1, 2, 8, 9, 15, and 16and Cytoxan 300 mg/m2 on days 1,8, and 15 IV every 4 weeks and 40 mg p.o. weekly of Decadron. First dose expected on 05/12/2019. Status post 2 cycles.  INTERVAL HISTORY: Debbie Chan 69 y.o. female returns to the clinic today for follow-up visit.  The patient is feeling fine today with no concerning complaints except for aching pain in the right hip.  Her neuropathy is much better and now limited to the toes.  She denied having any chest pain, shortness of breath, cough or hemoptysis.  She denied having any fever or chills.  She has no nausea, vomiting, diarrhea or constipation.  She denied having any headache or visual changes.  She continues to tolerate her current systemic chemotherapy fairly well.  The patient is still interested in traveling back to [place] at some point later this week.  She is here today for evaluation before starting cycle #3.  MEDICAL HISTORY: Past Medical History:  Diagnosis Date  . Cancer ([place])     ALLERGIES:  has No Known Allergies.  MEDICATIONS:    Current Outpatient Medications  Medication Sig Dispense Refill  . acyclovir (ZOVIRAX) 200 MG capsule Take 1 capsule (200 mg total) by mouth 2 (two) times daily. 60 capsule 2  . blood glucose meter kit and supplies Dispense based on patient and insurance preference. Use up to four times daily as directed. (FOR ICD-10 E10.9, E11.9). 1 each 0  . dexamethasone (DECADRON) 4 MG tablet Take 10 tablets (40 mg total) by mouth every 7 (seven) days. 40 tablet 3  . diphenhydrAMINE (BENADRYL ALLERGY) 25 MG tablet Take 1 tablet (25 mg total) by mouth every 6 (six) hours as needed for itching. 100 tablet 0  . lidocaine-prilocaine (EMLA) cream Apply 1 application topically as needed. 30 g 2  . metoprolol tartrate (LOPRESSOR) 25 MG tablet Take 0.5 tablets (12.5 mg total) by mouth 2 (two) times daily. 30 tablet 3  . omeprazole (PRILOSEC) 20 MG capsule Take 1 capsule (20 mg total) by mouth daily. 30 capsule 3  . oxyCODONE-acetaminophen (PERCOCET) 5-325 MG tablet Take 2 tablets by mouth every 6 (six) hours as needed. 20 tablet 0  . prochlorperazine (COMPAZINE) 10 MG tablet Take 1 tablet (10 mg total) by mouth every 6 (six) hours as needed for nausea or vomiting. 30 tablet 2  . RELION GLUCOSE TEST STRIPS test strip USE 1 TO CHECK GLUCOSE ONCE DAILY    . ReliOn Ultra Thin Lancets 30G MISC USE 1 TO CHECK GLUCOSE ONCE DAILY FOR GLUCOSE TESTING    . traMADol (ULTRAM) 50 MG tablet Take 50 mg by mouth every 8 (eight) hours as needed.     No current  facility-administered medications for this visit.    SURGICAL HISTORY:  Past Surgical History:  Procedure Laterality Date  . IR IMAGING GUIDED PORT INSERTION  05/10/2019    REVIEW OF SYSTEMS:  A comprehensive review of systems was negative except for: Constitutional: positive for fatigue Musculoskeletal: positive for arthralgias Neurological: positive for paresthesia   PHYSICAL EXAMINATION: General appearance: alert, cooperative, fatigued and no distress Head:  Normocephalic, without obvious abnormality, atraumatic Neck: no adenopathy, no JVD, supple, symmetrical, trachea midline and thyroid not enlarged, symmetric, no tenderness/mass/nodules Lymph nodes: Cervical, supraclavicular, and axillary nodes normal. Resp: clear to auscultation bilaterally Back: symmetric, no curvature. ROM normal. No CVA tenderness. Cardio: regular rate and rhythm, S1, S2 normal, no murmur, click, rub or gallop GI: soft, non-tender; bowel sounds normal; no masses,  no organomegaly Extremities: extremities normal, atraumatic, no cyanosis or edema  ECOG PERFORMANCE STATUS: 1 - Symptomatic but completely ambulatory  Blood pressure 140/68, pulse (!) 57, temperature 98.3 F (36.8 C), temperature source Temporal, resp. rate 17, height '5\' 8"'$  (1.727 m), weight 203 lb 1.6 oz (92.1 kg), SpO2 100 %.  LABORATORY DATA: Lab Results  Component Value Date   WBC 3.9 (L) 07/07/2019   HGB 11.9 (L) 07/07/2019   HCT 36.3 07/07/2019   MCV 95.0 07/07/2019   PLT 160 07/07/2019      Chemistry      Component Value Date/Time   NA 141 07/07/2019 0900   K 4.5 07/07/2019 0900   CL 107 07/07/2019 0900   CO2 25 07/07/2019 0900   BUN 15 07/07/2019 0900   CREATININE 1.36 (H) 07/07/2019 0900      Component Value Date/Time   CALCIUM 8.9 07/07/2019 0900   ALKPHOS 84 07/07/2019 0900   AST 17 07/07/2019 0900   ALT 12 07/07/2019 0900   BILITOT 0.4 07/07/2019 0900       RADIOGRAPHIC STUDIES: No results found.  ASSESSMENT AND PLAN: This is a very pleasant 69 years old African female originally from [place] who is visiting her son in [place] and was recently diagnosed with IgA multiple myeloma. The patient was a started initially on treatment with weekly subcutaneous Velcade 1.3 mg/M2, Revlimid 25 mg p.o. daily for 21 days every 4 weeks as well as weekly Decadron 40 mg orally.  Revlimid was discontinued secondary to hypersensitivity reaction with significant skin rash.  She  is currently on treatment with weekly Velcade and Decadron. Her treatment was discontinued secondary to disease progression.  The patient started treatment with Pomalyst and Decadron but this was discontinued secondary to hypersensitivity reaction to the Pomalyst. She is currently undergoing systemic chemotherapy with carfilzomib, Cytoxan and Decadron status post 2 cycles. She is tolerating this treatment fairly well and she is feeling much better. I recommended for her to proceed with cycle #3 today as planned. I will see the patient back for follow-up visit in 4 weeks for evaluation after repeating myeloma panel. We will give the patient a refill of acyclovir and omeprazole today. Regarding her travel back to [place], I asked the patient to give me a timeframe for her travel and we will adjust her treatment around her plan. She was advised to call immediately if she has any concerning symptoms in the interval. The patient voices understanding of current disease status and treatment options and is in agreement with the current care plan. All questions were answered. The patient knows to call the clinic with any problems, questions or concerns. We can certainly see the patient much sooner  if necessary.  Disclaimer: This note was dictated with voice recognition software. Similar sounding words can inadvertently be transcribed and may not be corrected upon review.

## 2019-07-08 ENCOUNTER — Other Ambulatory Visit: Payer: Self-pay

## 2019-07-08 ENCOUNTER — Inpatient Hospital Stay: Payer: Self-pay

## 2019-07-08 ENCOUNTER — Telehealth: Payer: Self-pay | Admitting: Internal Medicine

## 2019-07-08 VITALS — BP 157/72 | HR 72 | Temp 98.2°F | Resp 18

## 2019-07-08 DIAGNOSIS — C9000 Multiple myeloma not having achieved remission: Secondary | ICD-10-CM

## 2019-07-08 MED ORDER — DEXAMETHASONE SODIUM PHOSPHATE 10 MG/ML IJ SOLN
10.0000 mg | Freq: Once | INTRAMUSCULAR | Status: AC
  Administered 2019-07-08: 10 mg via INTRAVENOUS

## 2019-07-08 MED ORDER — SODIUM CHLORIDE 0.9 % IV SOLN
Freq: Once | INTRAVENOUS | Status: AC
  Filled 2019-07-08: qty 250

## 2019-07-08 MED ORDER — HEPARIN SOD (PORK) LOCK FLUSH 100 UNIT/ML IV SOLN
500.0000 [IU] | Freq: Once | INTRAVENOUS | Status: AC | PRN
  Administered 2019-07-08: 500 [IU]
  Filled 2019-07-08: qty 5

## 2019-07-08 MED ORDER — DEXTROSE 5 % IV SOLN
37.0000 mg/m2 | Freq: Once | INTRAVENOUS | Status: AC
  Administered 2019-07-08: 80 mg via INTRAVENOUS
  Filled 2019-07-08: qty 30

## 2019-07-08 MED ORDER — DEXAMETHASONE SODIUM PHOSPHATE 10 MG/ML IJ SOLN
INTRAMUSCULAR | Status: AC
  Filled 2019-07-08: qty 1

## 2019-07-08 MED ORDER — SODIUM CHLORIDE 0.9% FLUSH
10.0000 mL | INTRAVENOUS | Status: DC | PRN
  Administered 2019-07-08: 10 mL
  Filled 2019-07-08: qty 10

## 2019-07-08 NOTE — Patient Instructions (Signed)
[facility] Discharge Instructions for Patients Receiving Chemotherapy  Today you received the following chemotherapy agents:  Kyprolis  To help prevent nausea and vomiting after your treatment, we encourage you to take your nausea medication as prescribed.   If you develop nausea and vomiting that is not controlled by your nausea medication, call the clinic.   BELOW ARE SYMPTOMS THAT SHOULD BE REPORTED IMMEDIATELY:  *FEVER GREATER THAN 100.5 F  *CHILLS WITH OR WITHOUT FEVER  NAUSEA AND VOMITING THAT IS NOT CONTROLLED WITH YOUR NAUSEA MEDICATION  *UNUSUAL SHORTNESS OF BREATH  *UNUSUAL BRUISING OR BLEEDING  TENDERNESS IN MOUTH AND THROAT WITH OR WITHOUT PRESENCE OF ULCERS  *URINARY PROBLEMS  *BOWEL PROBLEMS  UNUSUAL RASH Items with * indicate a potential emergency and should be followed up as soon as possible.  Feel free to call the clinic should you have any questions or concerns. The clinic phone number is [phone].  Please show the CHEMO ALERT CARD at check-in to the Emergency Department and triage nurse.   

## 2019-07-08 NOTE — Progress Notes (Signed)
Pharmacist Chemotherapy Monitoring - Follow Up Assessment    I verify that I have reviewed each item in the below checklist:  . Regimen for the patient is scheduled for the appropriate day and plan matches scheduled date. Marland Kitchen Appropriate non-routine labs are ordered dependent on drug ordered. . If applicable, additional medications reviewed and ordered per protocol based on lifetime cumulative doses and/or treatment regimen.   Plan for follow-up and/or issues identified: No . I-vent associated with next due treatment: No . MD and/or nursing notified: No   Kennith Center, Pharm.D., CPP 07/08/2019@3 :28 PM

## 2019-07-08 NOTE — Telephone Encounter (Signed)
Scheduled per los. Gave printout in infusion  

## 2019-07-11 NOTE — Progress Notes (Signed)
Pharmacist Chemotherapy Monitoring - Follow Up Assessment    I verify that I have reviewed each item in the below checklist:  . Regimen for the patient is scheduled for the appropriate day and plan matches scheduled date. Marland Kitchen Appropriate non-routine labs are ordered dependent on drug ordered. . If applicable, additional medications reviewed and ordered per protocol based on lifetime cumulative doses and/or treatment regimen.   Plan for follow-up and/or issues identified: No . I-vent associated with next due treatment: No . MD and/or nursing notified: No  Carwile,Matthew K 07/11/2019 8:47 AM

## 2019-07-14 ENCOUNTER — Inpatient Hospital Stay: Payer: Self-pay

## 2019-07-14 ENCOUNTER — Other Ambulatory Visit: Payer: Self-pay

## 2019-07-14 VITALS — BP 152/77 | HR 69 | Temp 98.2°F | Resp 18 | Ht 68.0 in | Wt 202.5 lb

## 2019-07-14 DIAGNOSIS — C9000 Multiple myeloma not having achieved remission: Secondary | ICD-10-CM

## 2019-07-14 DIAGNOSIS — Z95828 Presence of other vascular implants and grafts: Secondary | ICD-10-CM

## 2019-07-14 LAB — CMP (CANCER CENTER ONLY)
ALT: 12 U/L (ref 0–44)
AST: 17 U/L (ref 15–41)
Albumin: 3.6 g/dL (ref 3.5–5.0)
Alkaline Phosphatase: 88 U/L (ref 38–126)
Anion gap: 10 (ref 5–15)
BUN: 15 mg/dL (ref 8–23)
CO2: 25 mmol/L (ref 22–32)
Calcium: 9.2 mg/dL (ref 8.9–10.3)
Chloride: 107 mmol/L (ref 98–111)
Creatinine: 1.3 mg/dL — ABNORMAL HIGH (ref 0.44–1.00)
GFR, Est AFR Am: 48 mL/min — ABNORMAL LOW (ref >60)
GFR, Estimated: 42 mL/min — ABNORMAL LOW (ref >60)
Glucose, Bld: 98 mg/dL (ref 70–99)
Potassium: 4.6 mmol/L (ref 3.5–5.1)
Sodium: 142 mmol/L (ref 135–145)
Total Bilirubin: 0.4 mg/dL (ref 0.3–1.2)
Total Protein: 6.7 g/dL (ref 6.5–8.1)

## 2019-07-14 LAB — CBC WITH DIFFERENTIAL (CANCER CENTER ONLY)
Abs Immature Granulocytes: 0.02 10*3/uL (ref 0.00–0.07)
Basophils Absolute: 0 10*3/uL (ref 0.0–0.1)
Basophils Relative: 0 %
Eosinophils Absolute: 0 10*3/uL (ref 0.0–0.5)
Eosinophils Relative: 1 %
HCT: 34.7 % — ABNORMAL LOW (ref 36.0–46.0)
Hemoglobin: 11.4 g/dL — ABNORMAL LOW (ref 12.0–15.0)
Immature Granulocytes: 0 %
Lymphocytes Relative: 13 %
Lymphs Abs: 0.7 10*3/uL (ref 0.7–4.0)
MCH: 31.2 pg (ref 26.0–34.0)
MCHC: 32.9 g/dL (ref 30.0–36.0)
MCV: 95.1 fL (ref 80.0–100.0)
Monocytes Absolute: 0.2 10*3/uL (ref 0.1–1.0)
Monocytes Relative: 4 %
Neutro Abs: 4.5 10*3/uL (ref 1.7–7.7)
Neutrophils Relative %: 82 %
Platelet Count: 68 10*3/uL — ABNORMAL LOW (ref 150–400)
RBC: 3.65 MIL/uL — ABNORMAL LOW (ref 3.87–5.11)
RDW: 14.4 % (ref 11.5–15.5)
WBC Count: 5.5 10*3/uL (ref 4.0–10.5)
nRBC: 0 % (ref 0.0–0.2)

## 2019-07-14 MED ORDER — SODIUM CHLORIDE 0.9% FLUSH
10.0000 mL | INTRAVENOUS | Status: DC | PRN
  Administered 2019-07-14 (×2): 10 mL
  Filled 2019-07-14: qty 10

## 2019-07-14 MED ORDER — SODIUM CHLORIDE 0.9 % IV SOLN
Freq: Once | INTRAVENOUS | Status: AC
  Filled 2019-07-14: qty 250

## 2019-07-14 MED ORDER — PALONOSETRON HCL INJECTION 0.25 MG/5ML
0.2500 mg | Freq: Once | INTRAVENOUS | Status: AC
  Administered 2019-07-14: 0.25 mg via INTRAVENOUS

## 2019-07-14 MED ORDER — HEPARIN SOD (PORK) LOCK FLUSH 100 UNIT/ML IV SOLN
500.0000 [IU] | Freq: Once | INTRAVENOUS | Status: AC | PRN
  Administered 2019-07-14: 500 [IU]
  Filled 2019-07-14: qty 5

## 2019-07-14 MED ORDER — SODIUM CHLORIDE 0.9% FLUSH
10.0000 mL | INTRAVENOUS | Status: DC | PRN
  Filled 2019-07-14: qty 10

## 2019-07-14 MED ORDER — SODIUM CHLORIDE 0.9 % IV SOLN
300.0000 mg/m2 | Freq: Once | INTRAVENOUS | Status: AC
  Administered 2019-07-14: 640 mg via INTRAVENOUS
  Filled 2019-07-14: qty 32

## 2019-07-14 MED ORDER — DEXTROSE 5 % IV SOLN
37.0000 mg/m2 | Freq: Once | INTRAVENOUS | Status: AC
  Administered 2019-07-14: 80 mg via INTRAVENOUS
  Filled 2019-07-14: qty 30

## 2019-07-14 MED ORDER — PALONOSETRON HCL INJECTION 0.25 MG/5ML
INTRAVENOUS | Status: AC
  Filled 2019-07-14: qty 5

## 2019-07-14 NOTE — Patient Instructions (Signed)
[facility] Discharge Instructions for Patients Receiving Chemotherapy  Today you received the following chemotherapy agents: Kyprolis and Cytoxan  To help prevent nausea and vomiting after your treatment, we encourage you to take your nausea medication as prescribed.    If you develop nausea and vomiting that is not controlled by your nausea medication, call the clinic.   BELOW ARE SYMPTOMS THAT SHOULD BE REPORTED IMMEDIATELY:  *FEVER GREATER THAN 100.5 F  *CHILLS WITH OR WITHOUT FEVER  NAUSEA AND VOMITING THAT IS NOT CONTROLLED WITH YOUR NAUSEA MEDICATION  *UNUSUAL SHORTNESS OF BREATH  *UNUSUAL BRUISING OR BLEEDING  TENDERNESS IN MOUTH AND THROAT WITH OR WITHOUT PRESENCE OF ULCERS  *URINARY PROBLEMS  *BOWEL PROBLEMS  UNUSUAL RASH Items with * indicate a potential emergency and should be followed up as soon as possible.  Feel free to call the clinic should you have any questions or concerns. The clinic phone number is [phone].  Please show the CHEMO ALERT CARD at check-in to the Emergency Department and triage nurse.   

## 2019-07-14 NOTE — Progress Notes (Signed)
Per Dr. Julien Nordmann, ok to treat with platelets 68.

## 2019-07-15 ENCOUNTER — Inpatient Hospital Stay: Payer: Self-pay

## 2019-07-15 ENCOUNTER — Other Ambulatory Visit: Payer: Self-pay

## 2019-07-15 VITALS — BP 159/88 | HR 82 | Temp 98.7°F | Resp 18

## 2019-07-15 DIAGNOSIS — C9000 Multiple myeloma not having achieved remission: Secondary | ICD-10-CM

## 2019-07-15 MED ORDER — SODIUM CHLORIDE 0.9% FLUSH
10.0000 mL | INTRAVENOUS | Status: DC | PRN
  Administered 2019-07-15: 10 mL
  Filled 2019-07-15: qty 10

## 2019-07-15 MED ORDER — SODIUM CHLORIDE 0.9 % IV SOLN
Freq: Once | INTRAVENOUS | Status: AC
  Filled 2019-07-15: qty 250

## 2019-07-15 MED ORDER — HEPARIN SOD (PORK) LOCK FLUSH 100 UNIT/ML IV SOLN
500.0000 [IU] | Freq: Once | INTRAVENOUS | Status: AC | PRN
  Administered 2019-07-15: 500 [IU]
  Filled 2019-07-15: qty 5

## 2019-07-15 MED ORDER — SODIUM CHLORIDE 0.9 % IV SOLN
10.0000 mg | Freq: Once | INTRAVENOUS | Status: AC
  Administered 2019-07-15: 10 mg via INTRAVENOUS
  Filled 2019-07-15: qty 10

## 2019-07-15 MED ORDER — DEXAMETHASONE SODIUM PHOSPHATE 10 MG/ML IJ SOLN: 10.0000 mg | Freq: Once | INTRAMUSCULAR | Status: DC

## 2019-07-15 MED ORDER — DEXTROSE 5 % IV SOLN
37.0000 mg/m2 | Freq: Once | INTRAVENOUS | Status: AC
  Administered 2019-07-15: 80 mg via INTRAVENOUS
  Filled 2019-07-15: qty 30

## 2019-07-15 NOTE — Patient Instructions (Signed)
[facility] Discharge Instructions for Patients Receiving Chemotherapy  Today you received the following chemotherapy agents:  Kyprolis  To help prevent nausea and vomiting after your treatment, we encourage you to take your nausea medication as prescribed.   If you develop nausea and vomiting that is not controlled by your nausea medication, call the clinic.   BELOW ARE SYMPTOMS THAT SHOULD BE REPORTED IMMEDIATELY:  *FEVER GREATER THAN 100.5 F  *CHILLS WITH OR WITHOUT FEVER  NAUSEA AND VOMITING THAT IS NOT CONTROLLED WITH YOUR NAUSEA MEDICATION  *UNUSUAL SHORTNESS OF BREATH  *UNUSUAL BRUISING OR BLEEDING  TENDERNESS IN MOUTH AND THROAT WITH OR WITHOUT PRESENCE OF ULCERS  *URINARY PROBLEMS  *BOWEL PROBLEMS  UNUSUAL RASH Items with * indicate a potential emergency and should be followed up as soon as possible.  Feel free to call the clinic should you have any questions or concerns. The clinic phone number is [phone].  Please show the CHEMO ALERT CARD at check-in to the Emergency Department and triage nurse.   

## 2019-07-15 NOTE — Progress Notes (Signed)
Pharmacist Chemotherapy Monitoring - Follow Up Assessment    I verify that I have reviewed each item in the below checklist:  . Regimen for the patient is scheduled for the appropriate day and plan matches scheduled date. Marland Kitchen Appropriate non-routine labs are ordered dependent on drug ordered. . If applicable, additional medications reviewed and ordered per protocol based on lifetime cumulative doses and/or treatment regimen.   Plan for follow-up and/or issues identified: No . I-vent associated with next due treatment: No . MD and/or nursing notified: No  Debbie Chan K 07/15/2019 9:06 AM

## 2019-07-18 NOTE — Progress Notes (Signed)
Pharmacist Chemotherapy Monitoring - Follow Up Assessment    I verify that I have reviewed each item in the below checklist:  . Regimen for the patient is scheduled for the appropriate day and plan matches scheduled date. Marland Kitchen Appropriate non-routine labs are ordered dependent on drug ordered. . If applicable, additional medications reviewed and ordered per protocol based on lifetime cumulative doses and/or treatment regimen.   Plan for follow-up and/or issues identified: No . I-vent associated with next due treatment: No . MD and/or nursing notified: No  Ishaq Maffei D 07/18/2019 4:17 PM

## 2019-07-21 ENCOUNTER — Inpatient Hospital Stay: Payer: Self-pay

## 2019-07-21 ENCOUNTER — Other Ambulatory Visit: Payer: Self-pay

## 2019-07-21 VITALS — BP 132/96 | HR 70 | Temp 98.3°F | Resp 18 | Wt 202.2 lb

## 2019-07-21 DIAGNOSIS — C9000 Multiple myeloma not having achieved remission: Secondary | ICD-10-CM

## 2019-07-21 DIAGNOSIS — Z95828 Presence of other vascular implants and grafts: Secondary | ICD-10-CM

## 2019-07-21 LAB — CBC WITH DIFFERENTIAL (CANCER CENTER ONLY)
Abs Immature Granulocytes: 0.01 10*3/uL (ref 0.00–0.07)
Basophils Absolute: 0 10*3/uL (ref 0.0–0.1)
Basophils Relative: 0 %
Eosinophils Absolute: 0 10*3/uL (ref 0.0–0.5)
Eosinophils Relative: 1 %
HCT: 33.8 % — ABNORMAL LOW (ref 36.0–46.0)
Hemoglobin: 11.2 g/dL — ABNORMAL LOW (ref 12.0–15.0)
Immature Granulocytes: 0 %
Lymphocytes Relative: 16 %
Lymphs Abs: 0.5 10*3/uL — ABNORMAL LOW (ref 0.7–4.0)
MCH: 31.5 pg (ref 26.0–34.0)
MCHC: 33.1 g/dL (ref 30.0–36.0)
MCV: 94.9 fL (ref 80.0–100.0)
Monocytes Absolute: 0.2 10*3/uL (ref 0.1–1.0)
Monocytes Relative: 5 %
Neutro Abs: 2.6 10*3/uL (ref 1.7–7.7)
Neutrophils Relative %: 78 %
Platelet Count: 74 10*3/uL — ABNORMAL LOW (ref 150–400)
RBC: 3.56 MIL/uL — ABNORMAL LOW (ref 3.87–5.11)
RDW: 14.9 % (ref 11.5–15.5)
WBC Count: 3.3 10*3/uL — ABNORMAL LOW (ref 4.0–10.5)
nRBC: 0 % (ref 0.0–0.2)

## 2019-07-21 LAB — CMP (CANCER CENTER ONLY)
ALT: 13 U/L (ref 0–44)
AST: 17 U/L (ref 15–41)
Albumin: 3.6 g/dL (ref 3.5–5.0)
Alkaline Phosphatase: 83 U/L (ref 38–126)
Anion gap: 8 (ref 5–15)
BUN: 13 mg/dL (ref 8–23)
CO2: 24 mmol/L (ref 22–32)
Calcium: 8.9 mg/dL (ref 8.9–10.3)
Chloride: 108 mmol/L (ref 98–111)
Creatinine: 1.15 mg/dL — ABNORMAL HIGH (ref 0.44–1.00)
GFR, Est AFR Am: 56 mL/min — ABNORMAL LOW (ref >60)
GFR, Estimated: 49 mL/min — ABNORMAL LOW (ref >60)
Glucose, Bld: 109 mg/dL — ABNORMAL HIGH (ref 70–99)
Potassium: 4.5 mmol/L (ref 3.5–5.1)
Sodium: 140 mmol/L (ref 135–145)
Total Bilirubin: 0.5 mg/dL (ref 0.3–1.2)
Total Protein: 6.9 g/dL (ref 6.5–8.1)

## 2019-07-21 MED ORDER — SODIUM CHLORIDE 0.9 % IV SOLN
Freq: Once | INTRAVENOUS | Status: AC
  Filled 2019-07-21: qty 250

## 2019-07-21 MED ORDER — SODIUM CHLORIDE 0.9 % IV SOLN
300.0000 mg/m2 | Freq: Once | INTRAVENOUS | Status: AC
  Administered 2019-07-21: 640 mg via INTRAVENOUS
  Filled 2019-07-21: qty 32

## 2019-07-21 MED ORDER — SODIUM CHLORIDE 0.9% FLUSH
10.0000 mL | INTRAVENOUS | Status: DC | PRN
  Administered 2019-07-21: 10 mL
  Filled 2019-07-21: qty 10

## 2019-07-21 MED ORDER — PALONOSETRON HCL INJECTION 0.25 MG/5ML
INTRAVENOUS | Status: AC
  Filled 2019-07-21: qty 5

## 2019-07-21 MED ORDER — HEPARIN SOD (PORK) LOCK FLUSH 100 UNIT/ML IV SOLN
500.0000 [IU] | Freq: Once | INTRAVENOUS | Status: AC | PRN
  Administered 2019-07-21: 500 [IU]
  Filled 2019-07-21: qty 5

## 2019-07-21 MED ORDER — DEXTROSE 5 % IV SOLN
37.0000 mg/m2 | Freq: Once | INTRAVENOUS | Status: AC
  Administered 2019-07-21: 80 mg via INTRAVENOUS
  Filled 2019-07-21: qty 30

## 2019-07-21 MED ORDER — PALONOSETRON HCL INJECTION 0.25 MG/5ML
0.2500 mg | Freq: Once | INTRAVENOUS | Status: AC
  Administered 2019-07-21: 0.25 mg via INTRAVENOUS

## 2019-07-21 MED ORDER — SODIUM CHLORIDE 0.9 % IV SOLN
Freq: Once | INTRAVENOUS | Status: DC
  Filled 2019-07-21: qty 250

## 2019-07-21 MED FILL — DEXAMETHASONE 4 MG TABLET: 4 | 30 days supply | Qty: 40 | Fill #3

## 2019-07-21 NOTE — Progress Notes (Signed)
Platelets today are 76, ok to treat per Dr. Julien Nordmann.

## 2019-07-22 ENCOUNTER — Other Ambulatory Visit: Payer: Self-pay

## 2019-07-22 ENCOUNTER — Inpatient Hospital Stay: Payer: Self-pay

## 2019-07-22 VITALS — BP 164/81 | HR 62 | Temp 98.4°F | Resp 18 | Ht 68.0 in

## 2019-07-22 DIAGNOSIS — C9000 Multiple myeloma not having achieved remission: Secondary | ICD-10-CM

## 2019-07-22 MED ORDER — DEXTROSE 5 % IV SOLN
37.0000 mg/m2 | Freq: Once | INTRAVENOUS | Status: AC
  Administered 2019-07-22: 80 mg via INTRAVENOUS
  Filled 2019-07-22: qty 10

## 2019-07-22 MED ORDER — SODIUM CHLORIDE 0.9 % IV SOLN
Freq: Once | INTRAVENOUS | Status: AC
  Filled 2019-07-22: qty 250

## 2019-07-22 MED ORDER — HEPARIN SOD (PORK) LOCK FLUSH 100 UNIT/ML IV SOLN
500.0000 [IU] | Freq: Once | INTRAVENOUS | Status: AC | PRN
  Administered 2019-07-22: 500 [IU]
  Filled 2019-07-22: qty 5

## 2019-07-22 MED ORDER — DEXAMETHASONE SODIUM PHOSPHATE 10 MG/ML IJ SOLN
INTRAMUSCULAR | Status: AC
  Filled 2019-07-22: qty 1

## 2019-07-22 MED ORDER — DEXAMETHASONE SODIUM PHOSPHATE 10 MG/ML IJ SOLN
10.0000 mg | Freq: Once | INTRAMUSCULAR | Status: AC
  Administered 2019-07-22: 10 mg via INTRAVENOUS

## 2019-07-22 MED ORDER — SODIUM CHLORIDE 0.9% FLUSH
10.0000 mL | INTRAVENOUS | Status: DC | PRN
  Administered 2019-07-22: 10 mL
  Filled 2019-07-22: qty 10

## 2019-07-22 NOTE — Patient Instructions (Signed)
[facility] Discharge Instructions for Patients Receiving Chemotherapy  Today you received the following chemotherapy agents:  Kyprolis  To help prevent nausea and vomiting after your treatment, we encourage you to take your nausea medication as prescribed.   If you develop nausea and vomiting that is not controlled by your nausea medication, call the clinic.   BELOW ARE SYMPTOMS THAT SHOULD BE REPORTED IMMEDIATELY:  *FEVER GREATER THAN 100.5 F  *CHILLS WITH OR WITHOUT FEVER  NAUSEA AND VOMITING THAT IS NOT CONTROLLED WITH YOUR NAUSEA MEDICATION  *UNUSUAL SHORTNESS OF BREATH  *UNUSUAL BRUISING OR BLEEDING  TENDERNESS IN MOUTH AND THROAT WITH OR WITHOUT PRESENCE OF ULCERS  *URINARY PROBLEMS  *BOWEL PROBLEMS  UNUSUAL RASH Items with * indicate a potential emergency and should be followed up as soon as possible.  Feel free to call the clinic should you have any questions or concerns. The clinic phone number is [phone].  Please show the CHEMO ALERT CARD at check-in to the Emergency Department and triage nurse.   

## 2019-08-03 NOTE — Progress Notes (Signed)
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  PRIOR THERAPY: 1)Systemic therapy with Velcade 1.3 mg/M2 weekly, Revlimid 25 mg p.o. daily for 14 days every 3 weeks in addition to weekly Decadron 40 mg orally. First dose of treatment 10/08/2017.Treatment was placed on hold due to development of a significant rash.Sheresumedtreatment withonlydexamethasone and Velcade on 11/19/2017.This was discontinued on 04/14/2019 due to evidence of disease progression. 2)Pomalyst 4 mg p.o. for 21 days every 4 weeks and 40 mg p.o. Decadron once a week. First dose3/06/2019-05/04/2019.Status post 6 days of treatment. Discontinued secondary to allergy to Pomalyst.  CURRENT THERAPY: Chemotherapy with carfilzomibon days 1, 2, 8, 9, 15, and 16and Cytoxan 300 mg/m2 on days 1,8, and 15 IV every 4 weeks and 40 mg p.o. weekly of Decadron. First dose expected on 05/12/2019. Status post 3 cycles.   INTERVAL HISTORY: Debbie Chan 69 y.o. female returns to the clinic today for a follow up visit. Today, the patient is feeling fair without any concerning complaints except for continued pain in her left hip when she stands up. If she waits a few seconds, her pain subsides and she is able to ambulate. She also continues to endorse abdominal bloating likely secondary to her steroid use. She denies any gas. She also states about 2 weeks ago she had some neck stiffness in the right cervical area. She denies traumas or injuries. Denies sleeping in an abnormal position. She did not take anything for pain. She states her pain is mostly improved at this time. She denies associated fevers, or numbness/tingling down her arm. She did report a separate incident where her right pinky was locked in a flexed position. After massaging the area, it improved.  She denies any recent chills, night sweats, or weight  loss.  She continues to experience peripheral neuropathy in her toes. She was taking gabapentin but this was discontinued secondary to swelling. Denies any nausea, vomiting, diarrhea, or constipation. Denies any bleeding or bruising.  She is here today for evaluation before starting cycle #4.    MEDICAL HISTORY: Past Medical History:  Diagnosis Date  . Cancer ([place])     ALLERGIES:  has No Known Allergies.  MEDICATIONS:  Current Outpatient Medications  Medication Sig Dispense Refill  . acyclovir (ZOVIRAX) 200 MG capsule Take 1 capsule (200 mg total) by mouth 2 (two) times daily. 60 capsule 2  . blood glucose meter kit and supplies Dispense based on patient and insurance preference. Use up to four times daily as directed. (FOR ICD-10 E10.9, E11.9). 1 each 0  . dexamethasone (DECADRON) 4 MG tablet Take 10 tablets (40 mg total) by mouth every 7 (seven) days. 40 tablet 3  . diphenhydrAMINE (BENADRYL ALLERGY) 25 MG tablet Take 1 tablet (25 mg total) by mouth every 6 (six) hours as needed for itching. 100 tablet 0  . lidocaine-prilocaine (EMLA) cream Apply 1 application topically as needed. 30 g 2  . metoprolol tartrate (LOPRESSOR) 25 MG tablet Take 0.5 tablets (12.5 mg total) by mouth 2 (two) times daily. 30 tablet 3  . omeprazole (PRILOSEC) 20 MG capsule Take 1 capsule (20 mg total) by mouth daily. 30 capsule 3  . oxyCODONE-acetaminophen (PERCOCET) 5-325 MG tablet Take 2 tablets by mouth every 6 (six) hours as needed. 20 tablet 0  . prochlorperazine (COMPAZINE) 10 MG tablet Take 1 tablet (10 mg total) by mouth every 6 (six) hours as needed  for nausea or vomiting. 30 tablet 2  . RELION GLUCOSE TEST STRIPS test strip USE 1 TO CHECK GLUCOSE ONCE DAILY    . ReliOn Ultra Thin Lancets 30G MISC USE 1 TO CHECK GLUCOSE ONCE DAILY FOR GLUCOSE TESTING    . traMADol (ULTRAM) 50 MG tablet Take 50 mg by mouth every 8 (eight) hours as needed.     No current facility-administered medications for this visit.    Facility-Administered Medications Ordered in Other Visits  Medication Dose Route Frequency Provider Last Rate Last Admin  . carfilzomib (KYPROLIS) 80 mg in dextrose 5 % 100 mL chemo infusion  36 mg/m2 (Treatment Plan Recorded) Intravenous Once Curt Bears, MD      . cyclophosphamide (CYTOXAN) 640 mg in sodium chloride 0.9 % 250 mL chemo infusion  300 mg/m2 (Treatment Plan Recorded) Intravenous Once Curt Bears, MD      . heparin lock flush 100 unit/mL  500 Units Intracatheter Once PRN Curt Bears, MD      . sodium chloride flush (NS) 0.9 % injection 10 mL  10 mL Intracatheter PRN Curt Bears, MD        SURGICAL HISTORY:  Past Surgical History:  Procedure Laterality Date  . IR IMAGING GUIDED PORT INSERTION  05/10/2019    REVIEW OF SYSTEMS:   Review of Systems  Constitutional: Negative for appetite change, chills, fatigue, fever and unexpected weight change.  HENT:  Negative for mouth sores, nosebleeds, sore throat and trouble swallowing.   Eyes: Negative for eye problems and icterus.  Respiratory: Negative for cough, hemoptysis, shortness of breath and wheezing.   Cardiovascular: Positive for baseline bilateral lower extremity swelling. Negative for chest pain. Gastrointestinal: Positive for abdominal bloating. Negative for abdominal pain, constipation, diarrhea, nausea and vomiting.  Genitourinary: Negative for bladder incontinence, difficulty urinating, dysuria, frequency and hematuria.   Musculoskeletal: Positive for persistent left hip discomfort. Positive for neck stiffness/pain 2 weeks ago (improved). Negative for back pain and gait change.  Skin: Negative for itching and rash.  Neurological: Negative for dizziness, extremity weakness, gait problem, headaches, light-headedness and seizures.  Hematological: Negative for adenopathy. Does not bruise/bleed easily.  Psychiatric/Behavioral: Negative for confusion, depression and sleep disturbance. The patient is not  nervous/anxious.     PHYSICAL EXAMINATION:  Blood pressure (!) 152/72, pulse 63, temperature (!) 97.5 F (36.4 C), temperature source Temporal, resp. rate 20, height '5\' 8"'  (1.727 m), weight 200 lb 3.2 oz (90.8 kg), SpO2 100 %.  ECOG PERFORMANCE STATUS: 1 - Symptomatic but completely ambulatory  Physical Exam  Constitutional: Oriented to person, place, and time and well-developed, well-nourished, and in no distress.  HENT:  Head: Normocephalic and atraumatic.  Mouth/Throat: Oropharynx is clear and moist. No oropharyngeal exudate.  Eyes: Conjunctivae are normal. Right eye exhibits no discharge. Left eye exhibits no discharge. No scleral icterus.  Neck: Normal range of motion. Neck supple.  Cardiovascular: Normal rate, regular rhythm, normal heart sounds and intact distal pulses.   Pulmonary/Chest: Effort normal and breath sounds normal. No respiratory distress. No wheezes. No rales.  Abdominal: Soft. Bowel sounds are normal. Exhibits no distension and no mass. There is no tenderness.  Musculoskeletal: Mild bilateral lower extremity swelling. Normal range of motion.  Lymphadenopathy:    No cervical adenopathy.  Neurological: Alert and oriented to person, place, and time. Exhibits normal muscle tone. Gait normal. Coordination normal.  Skin: Skin is warm and dry. No rash noted. Not diaphoretic. No erythema. No pallor.  Psychiatric: Mood, memory and judgment normal.  Vitals  reviewed.  LABORATORY DATA: Lab Results  Component Value Date   WBC 3.3 (L) 08/04/2019   HGB 11.6 (L) 08/04/2019   HCT 35.5 (L) 08/04/2019   MCV 96.7 08/04/2019   PLT 136 (L) 08/04/2019      Chemistry      Component Value Date/Time   NA 139 08/04/2019 0935   K 4.6 08/04/2019 0935   CL 107 08/04/2019 0935   CO2 22 08/04/2019 0935   BUN 11 08/04/2019 0935   CREATININE 1.17 (H) 08/04/2019 0935      Component Value Date/Time   CALCIUM 9.2 08/04/2019 0935   ALKPHOS 81 08/04/2019 0935   AST 14 (L) 08/04/2019  0935   ALT 10 08/04/2019 0935   BILITOT 0.5 08/04/2019 0935       RADIOGRAPHIC STUDIES:  No results found.   ASSESSMENT/PLAN:  This is a very pleasant 69 years old African female originally from [place] who is visiting her son in [place] and was diagnosed with IgA multiple myeloma.  The patient was a started initially on treatment with weekly subcutaneous Velcade 1.3 mg/M2, Revlimid 25 mg p.o. daily for 21 days every 4 weeks as well as weekly Decadron 40 mg orally. She was tolerating the treatment well but she developed significant skin rash secondary to treatment with Revlimid and this was discontinued.  The patient continued treatment with subcutaneous Velcade and Decadron.She developed evidence of disease progression in February 2021. Her treatment was discontinued.  The patient was then started on Pomalyst 4 mg p.o. daily for 21 days on every 4 weeks with weekly Decadron 40 mg p.o. She is status post 6 days of treatment. She developed a significant skin rash/reaction to Pomalyst.  This was discontinued.  The patient is currently undergoing treatment with Carfilzomibon days 1, 2, 8, 9, 15, and 16 and Cytoxan on days 1, 8, and 15 IV every 4 weeks with 40 mg p.o. weekly of Decadron. She is status post 3 cycles.   The patient was seen with Dr. Julien Nordmann today. Labs were reviewed.  Recommend that she proceed with cycle #4 today scheduled. Her myeloma panel is pending at this time.   We will see her back for follow-up visit in 1 weeks for evaluation and to review her myeloma panel before starting cycle #5.   I encouraged the patient to use tylenol for her neck and hip pain. Her neck discomfort is improved at this time.   The patient was advised to call immediately if she has any concerning symptoms in the interval. The patient voices understanding of current disease status and treatment options and is in agreement with the current care plan. All questions  were answered. The patient knows to call the clinic with any problems, questions or concerns. We can certainly see the patient much sooner if necessary    Orders Placed This Encounter  Procedures  . Lactate dehydrogenase (LDH)    Standing Status:   Standing    Number of Occurrences:   9    Standing Expiration Date:   08/03/2020     Tobe Sos Annaya Bangert, PA-C 08/04/19  ADDENDUM: Hematology/Oncology Attending: I had a face-to-face encounter with the patient today.  I recommended her care plan.  This is a very pleasant 69 years old African female from [place] diagnosed with multiple myeloma and treated initially with subcutaneous Velcade, Revlimid and Decadron but Revlimid was discontinued secondary to hypersensitivity reaction with significant skin rash.  The patient continued further treatment with weekly subcutaneous Velcade  and Decadron for almost 19 months discontinued secondary to disease progression.  She was started on treatment with Pomalyst and Decadron but again she could not tolerate Pomalyst because of the significant skin rash. The patient is currently undergoing systemic chemotherapy with carfilzomib, Cytoxan and Decadron status post 3 cycles.  She has been tolerating this treatment well and feeling much better. She continues to have aching pain all over her body especially in the lower extremities. I recommended for the patient to continue her treatment with the same regimen for now.  She will have repeat myeloma panel today for evaluation of her condition. The patient will come back for follow-up visit in 1 week for evaluation and discussion of the myeloma panel and further recommendation regarding her condition. She was advised to call immediately if she has any concerning symptoms in the interval.  Disclaimer: This note was dictated with voice recognition software. Similar sounding words can inadvertently be transcribed and may be missed upon review. Eilleen Kempf,  MD 08/04/19

## 2019-08-04 ENCOUNTER — Other Ambulatory Visit: Payer: Self-pay

## 2019-08-04 ENCOUNTER — Inpatient Hospital Stay: Payer: Self-pay

## 2019-08-04 ENCOUNTER — Encounter: Payer: Self-pay | Admitting: Physician Assistant

## 2019-08-04 ENCOUNTER — Inpatient Hospital Stay: Payer: Self-pay | Attending: Internal Medicine | Admitting: Physician Assistant

## 2019-08-04 VITALS — BP 152/72 | HR 63 | Temp 97.5°F | Resp 20 | Ht 68.0 in | Wt 200.2 lb

## 2019-08-04 DIAGNOSIS — Z5111 Encounter for antineoplastic chemotherapy: Secondary | ICD-10-CM | POA: Insufficient documentation

## 2019-08-04 DIAGNOSIS — G629 Polyneuropathy, unspecified: Secondary | ICD-10-CM | POA: Insufficient documentation

## 2019-08-04 DIAGNOSIS — C9000 Multiple myeloma not having achieved remission: Secondary | ICD-10-CM

## 2019-08-04 DIAGNOSIS — R21 Rash and other nonspecific skin eruption: Secondary | ICD-10-CM | POA: Insufficient documentation

## 2019-08-04 DIAGNOSIS — R140 Abdominal distension (gaseous): Secondary | ICD-10-CM | POA: Insufficient documentation

## 2019-08-04 DIAGNOSIS — Z79899 Other long term (current) drug therapy: Secondary | ICD-10-CM | POA: Insufficient documentation

## 2019-08-04 DIAGNOSIS — M79676 Pain in unspecified toe(s): Secondary | ICD-10-CM

## 2019-08-04 DIAGNOSIS — Z95828 Presence of other vascular implants and grafts: Secondary | ICD-10-CM

## 2019-08-04 DIAGNOSIS — M25552 Pain in left hip: Secondary | ICD-10-CM | POA: Insufficient documentation

## 2019-08-04 LAB — CMP (CANCER CENTER ONLY)
ALT: 10 U/L (ref 0–44)
AST: 14 U/L — ABNORMAL LOW (ref 15–41)
Albumin: 3.8 g/dL (ref 3.5–5.0)
Alkaline Phosphatase: 81 U/L (ref 38–126)
Anion gap: 10 (ref 5–15)
BUN: 11 mg/dL (ref 8–23)
CO2: 22 mmol/L (ref 22–32)
Calcium: 9.2 mg/dL (ref 8.9–10.3)
Chloride: 107 mmol/L (ref 98–111)
Creatinine: 1.17 mg/dL — ABNORMAL HIGH (ref 0.44–1.00)
GFR, Est AFR Am: 55 mL/min — ABNORMAL LOW (ref >60)
GFR, Estimated: 48 mL/min — ABNORMAL LOW (ref >60)
Glucose, Bld: 112 mg/dL — ABNORMAL HIGH (ref 70–99)
Potassium: 4.6 mmol/L (ref 3.5–5.1)
Sodium: 139 mmol/L (ref 135–145)
Total Bilirubin: 0.5 mg/dL (ref 0.3–1.2)
Total Protein: 6.8 g/dL (ref 6.5–8.1)

## 2019-08-04 LAB — CBC WITH DIFFERENTIAL (CANCER CENTER ONLY)
Abs Immature Granulocytes: 0.01 10*3/uL (ref 0.00–0.07)
Basophils Absolute: 0 10*3/uL (ref 0.0–0.1)
Basophils Relative: 1 %
Eosinophils Absolute: 0.1 10*3/uL (ref 0.0–0.5)
Eosinophils Relative: 2 %
HCT: 35.5 % — ABNORMAL LOW (ref 36.0–46.0)
Hemoglobin: 11.6 g/dL — ABNORMAL LOW (ref 12.0–15.0)
Immature Granulocytes: 0 %
Lymphocytes Relative: 16 %
Lymphs Abs: 0.5 10*3/uL — ABNORMAL LOW (ref 0.7–4.0)
MCH: 31.6 pg (ref 26.0–34.0)
MCHC: 32.7 g/dL (ref 30.0–36.0)
MCV: 96.7 fL (ref 80.0–100.0)
Monocytes Absolute: 0.1 10*3/uL (ref 0.1–1.0)
Monocytes Relative: 4 %
Neutro Abs: 2.6 10*3/uL (ref 1.7–7.7)
Neutrophils Relative %: 77 %
Platelet Count: 136 10*3/uL — ABNORMAL LOW (ref 150–400)
RBC: 3.67 MIL/uL — ABNORMAL LOW (ref 3.87–5.11)
RDW: 14.1 % (ref 11.5–15.5)
WBC Count: 3.3 10*3/uL — ABNORMAL LOW (ref 4.0–10.5)
nRBC: 0 % (ref 0.0–0.2)

## 2019-08-04 LAB — LACTATE DEHYDROGENASE: LDH: 262 U/L — ABNORMAL HIGH (ref 98–192)

## 2019-08-04 MED ORDER — SODIUM CHLORIDE 0.9% FLUSH
10.0000 mL | INTRAVENOUS | Status: DC | PRN
  Administered 2019-08-04: 10 mL
  Filled 2019-08-04: qty 10

## 2019-08-04 MED ORDER — PALONOSETRON HCL INJECTION 0.25 MG/5ML
0.2500 mg | Freq: Once | INTRAVENOUS | Status: AC
  Administered 2019-08-04: 0.25 mg via INTRAVENOUS

## 2019-08-04 MED ORDER — HEPARIN SOD (PORK) LOCK FLUSH 100 UNIT/ML IV SOLN
500.0000 [IU] | Freq: Once | INTRAVENOUS | Status: AC | PRN
  Administered 2019-08-04: 500 [IU]
  Filled 2019-08-04: qty 5

## 2019-08-04 MED ORDER — DEXTROSE 5 % IV SOLN
36.0000 mg/m2 | Freq: Once | INTRAVENOUS | Status: AC
  Administered 2019-08-04: 80 mg via INTRAVENOUS
  Filled 2019-08-04: qty 10

## 2019-08-04 MED ORDER — SODIUM CHLORIDE 0.9 % IV SOLN
Freq: Once | INTRAVENOUS | Status: AC
  Filled 2019-08-04: qty 250

## 2019-08-04 MED ORDER — ACETAMINOPHEN 325 MG PO TABS
ORAL_TABLET | ORAL | Status: AC
  Filled 2019-08-04: qty 2

## 2019-08-04 MED ORDER — ACETAMINOPHEN 325 MG PO TABS
650.0000 mg | ORAL_TABLET | Freq: Once | ORAL | Status: AC
  Administered 2019-08-04: 650 mg via ORAL

## 2019-08-04 MED ORDER — PALONOSETRON HCL INJECTION 0.25 MG/5ML
INTRAVENOUS | Status: AC
  Filled 2019-08-04: qty 5

## 2019-08-04 MED ORDER — SODIUM CHLORIDE 0.9 % IV SOLN
300.0000 mg/m2 | Freq: Once | INTRAVENOUS | Status: AC
  Administered 2019-08-04: 640 mg via INTRAVENOUS
  Filled 2019-08-04: qty 32

## 2019-08-04 MED ORDER — SODIUM CHLORIDE 0.9% FLUSH
10.0000 mL | INTRAVENOUS | Status: DC | PRN
  Filled 2019-08-04: qty 10

## 2019-08-04 NOTE — Progress Notes (Signed)
During final treatment flush patient began complaining of severe pain along left lateral side of ankle/foot. Patient was very vocal in her discomfort and left ankle did appear slightly more swollen than right (though she denied any increase in pain when area was palpated). Called Cassie Heilingoetter-PA with update and she agreed to come assess patient at chair side. She and Dr. Julien Nordmann came to treatment room and assessed patient. Patient's shoe removed and both Dr. Julien Nordmann and Cassie palpated lower extremity. Observed nail on middle toe was black, and when asked how long it was in that state, patient stated "about a week". After assessing site Dr. Julien Nordmann gave verbal orders for 650 mg Tylenol PO and for patient to wear wider/more breathable shoes. Orders placed and medication administered. Patient able to ambulate out of clinic unassissted.

## 2019-08-04 NOTE — Progress Notes (Signed)
Pt. states she took Decadron 40 mg po this morning prior to treatment.

## 2019-08-04 NOTE — Patient Instructions (Signed)
[facility] Discharge Instructions for Patients Receiving Chemotherapy  Today you received the following chemotherapy agents: Carfilzomib (Kyprolis), and Cyclophosphamide (Cytoxan)  To help prevent nausea and vomiting after your treatment, we encourage you to take your nausea medication as directed by your MD.   If you develop nausea and vomiting that is not controlled by your nausea medication, call the clinic.   BELOW ARE SYMPTOMS THAT SHOULD BE REPORTED IMMEDIATELY:  *FEVER GREATER THAN 100.5 F  *CHILLS WITH OR WITHOUT FEVER  NAUSEA AND VOMITING THAT IS NOT CONTROLLED WITH YOUR NAUSEA MEDICATION  *UNUSUAL SHORTNESS OF BREATH  *UNUSUAL BRUISING OR BLEEDING  TENDERNESS IN MOUTH AND THROAT WITH OR WITHOUT PRESENCE OF ULCERS  *URINARY PROBLEMS  *BOWEL PROBLEMS  UNUSUAL RASH Items with * indicate a potential emergency and should be followed up as soon as possible.  Feel free to call the clinic should you have any questions or concerns. The clinic phone number is [phone].  Please show the [facility] at check-in to the Emergency Department and triage nurse.

## 2019-08-05 ENCOUNTER — Inpatient Hospital Stay: Payer: Self-pay

## 2019-08-05 ENCOUNTER — Telehealth: Payer: Self-pay | Admitting: Physician Assistant

## 2019-08-05 ENCOUNTER — Other Ambulatory Visit: Payer: Self-pay

## 2019-08-05 VITALS — BP 147/76 | HR 71 | Temp 98.2°F | Resp 18

## 2019-08-05 DIAGNOSIS — C9000 Multiple myeloma not having achieved remission: Secondary | ICD-10-CM

## 2019-08-05 LAB — KAPPA/LAMBDA LIGHT CHAINS
Kappa free light chain: 5.7 mg/L (ref 3.3–19.4)
Kappa, lambda light chain ratio: 0.01 — ABNORMAL LOW (ref 0.26–1.65)
Lambda free light chains: 607.4 mg/L — ABNORMAL HIGH (ref 5.7–26.3)

## 2019-08-05 LAB — IGG, IGA, IGM
IgA: 1207 mg/dL — ABNORMAL HIGH (ref 87–352)
IgG (Immunoglobin G), Serum: 299 mg/dL — ABNORMAL LOW (ref 586–1602)
IgM (Immunoglobulin M), Srm: 8 mg/dL — ABNORMAL LOW (ref 26–217)

## 2019-08-05 LAB — BETA 2 MICROGLOBULIN, SERUM: Beta-2 Microglobulin: 3 mg/L — ABNORMAL HIGH (ref 0.6–2.4)

## 2019-08-05 MED ORDER — HEPARIN SOD (PORK) LOCK FLUSH 100 UNIT/ML IV SOLN
500.0000 [IU] | Freq: Once | INTRAVENOUS | Status: AC | PRN
  Administered 2019-08-05: 500 [IU]
  Filled 2019-08-05: qty 5

## 2019-08-05 MED ORDER — SODIUM CHLORIDE 0.9 % IV SOLN
Freq: Once | INTRAVENOUS | Status: AC
  Filled 2019-08-05: qty 250

## 2019-08-05 MED ORDER — DEXTROSE 5 % IV SOLN
36.0000 mg/m2 | Freq: Once | INTRAVENOUS | Status: AC
  Administered 2019-08-05: 80 mg via INTRAVENOUS
  Filled 2019-08-05: qty 10

## 2019-08-05 MED ORDER — SODIUM CHLORIDE 0.9% FLUSH
10.0000 mL | INTRAVENOUS | Status: DC | PRN
  Administered 2019-08-05: 10 mL
  Filled 2019-08-05: qty 10

## 2019-08-05 MED ORDER — DEXAMETHASONE SODIUM PHOSPHATE 10 MG/ML IJ SOLN: 10.0000 mg | Freq: Once | INTRAMUSCULAR | Status: DC

## 2019-08-05 MED ORDER — SODIUM CHLORIDE 0.9 % IV SOLN
10.0000 mg | Freq: Once | INTRAVENOUS | Status: DC
  Filled 2019-08-05: qty 1

## 2019-08-05 NOTE — Progress Notes (Signed)
Per patient, she took dex yesterday and does not wish to take it today. Okay per [employer], PA. Education provided on premedications. Will monitor patient per protocol.

## 2019-08-05 NOTE — Patient Instructions (Signed)
[facility] Discharge Instructions for Patients Receiving Chemotherapy  Today you received the following chemotherapy agents: Carfilzomib (Kyprolis).  To help prevent nausea and vomiting after your treatment, we encourage you to take your nausea medication as directed by your MD.   If you develop nausea and vomiting that is not controlled by your nausea medication, call the clinic.   BELOW ARE SYMPTOMS THAT SHOULD BE REPORTED IMMEDIATELY:  *FEVER GREATER THAN 100.5 F  *CHILLS WITH OR WITHOUT FEVER  NAUSEA AND VOMITING THAT IS NOT CONTROLLED WITH YOUR NAUSEA MEDICATION  *UNUSUAL SHORTNESS OF BREATH  *UNUSUAL BRUISING OR BLEEDING  TENDERNESS IN MOUTH AND THROAT WITH OR WITHOUT PRESENCE OF ULCERS  *URINARY PROBLEMS  *BOWEL PROBLEMS  UNUSUAL RASH Items with * indicate a potential emergency and should be followed up as soon as possible.  Feel free to call the clinic should you have any questions or concerns. The clinic phone number is [phone].  Please show the [facility] at check-in to the Emergency Department and triage nurse.    [email or web address] This is the link where you can sign up for your COVID-19 vaccination!

## 2019-08-05 NOTE — Telephone Encounter (Signed)
Scheduled per los. Patient will receive printout in infusion

## 2019-08-10 NOTE — Progress Notes (Signed)
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  PRIOR THERAPY: 1)Systemic therapy with Velcade 1.3 mg/M2 weekly, Revlimid 25 mg p.o. daily for 14 days every 3 weeks in addition to weekly Decadron 40 mg orally. First dose of treatment 10/08/2017.Treatment was placed on hold due to development of a significant rash.Sheresumedtreatment withonlydexamethasone and Velcade on 11/19/2017.This was discontinued on 04/14/2019 due to evidence of disease progression. 2)Pomalyst 4 mg p.o. for 21 days every 4 weeks and 40 mg p.o. Decadron once a week. First dose3/06/2019-05/04/2019.Status post 6 days of treatment. Discontinued secondary to allergy to Pomalyst.  CURRENT THERAPY: Chemotherapy with carfilzomibon days 1, 2, 8, 9, 15, and 16and Cytoxan 300 mg/m2 on days 1,8, and 15 IV every 4 weeks and 40 mg p.o. weekly of Decadron. First dose expected on 05/12/2019. Status post 3 cycles.  INTERVAL HISTORY: Steph Cheadle 69 y.o. female returns to the clinic today for follow-up visit.  The patient is feeling fair today without any concerning complaints. She denies any recent fever, chills, night sweats, or weight loss.  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  Gabapentin was discontinued secondary to swelling.  She denies any nausea, vomiting, diarrhea, or constipation.  She denies any abnormal bleeding or bruising.  She denies any signs and symptoms of infection including sore throat, nasal congestion, cough, shortness of breath, skin infections, or dysuria. She still has persistent hip pain that radiates to her legs but states it is a little bit better. The patient recently had a myeloma panel performed.  She is here today for evaluation before starting day 8 of cycle #4.   MEDICAL HISTORY: Past Medical History:  Diagnosis Date  . Cancer ([place])      ALLERGIES:  has No Known Allergies.  MEDICATIONS:  Current Outpatient Medications  Medication Sig Dispense Refill  . acyclovir (ZOVIRAX) 200 MG capsule Take 1 capsule (200 mg total) by mouth 2 (two) times daily. 60 capsule 2  . blood glucose meter kit and supplies Dispense based on patient and insurance preference. Use up to four times daily as directed. (FOR ICD-10 E10.9, E11.9). 1 each 0  . dexamethasone (DECADRON) 4 MG tablet Take 10 tablets (40 mg total) by mouth every 7 (seven) days. 40 tablet 3  . diphenhydrAMINE (BENADRYL ALLERGY) 25 MG tablet Take 1 tablet (25 mg total) by mouth every 6 (six) hours as needed for itching. 100 tablet 0  . lidocaine-prilocaine (EMLA) cream Apply 1 application topically as needed. 30 g 2  . metoprolol tartrate (LOPRESSOR) 25 MG tablet Take 0.5 tablets (12.5 mg total) by mouth 2 (two) times daily. 30 tablet 3  . omeprazole (PRILOSEC) 20 MG capsule Take 1 capsule (20 mg total) by mouth daily. 30 capsule 3  . oxyCODONE-acetaminophen (PERCOCET) 5-325 MG tablet Take 2 tablets by mouth every 6 (six) hours as needed. 20 tablet 0  . prochlorperazine (COMPAZINE) 10 MG tablet Take 1 tablet (10 mg total) by mouth every 6 (six) hours as needed for nausea or vomiting. 30 tablet 2  . RELION GLUCOSE TEST STRIPS test strip USE 1 TO CHECK GLUCOSE ONCE DAILY    . ReliOn Ultra Thin Lancets 30G MISC USE 1 TO CHECK GLUCOSE ONCE DAILY FOR GLUCOSE TESTING    . traMADol (ULTRAM) 50 MG tablet Take 50 mg by mouth every 8 (eight) hours as needed.     No current facility-administered  medications for this visit.    SURGICAL HISTORY:  Past Surgical History:  Procedure Laterality Date  . IR IMAGING GUIDED PORT INSERTION  05/10/2019    REVIEW OF SYSTEMS:   Review of Systems  Constitutional: Negative for appetite change, chills, fatigue, fever and unexpected weight change.  HENT:  Negative for mouth sores, nosebleeds, sore throat and trouble swallowing.   Eyes: Negative for  eye problems and icterus.  Respiratory: Negative for cough, hemoptysis, shortness of breath and wheezing.   Cardiovascular: Positive for baseline bilateral lower extremity swelling. Negative for chest pain. Gastrointestinal: Positive for abdominal bloating. Negative for abdominal pain, constipation, diarrhea, nausea and vomiting.  Genitourinary: Negative for bladder incontinence, difficulty urinating, dysuria, frequency and hematuria.   Musculoskeletal: Positive for persistent left hip discomfort. Positive for neck stiffness/pain 2 weeks ago (improved). Negative for back pain and gait change.  Skin: Negative for itching and rash.  Neurological: Negative for dizziness, extremity weakness, gait problem, headaches, light-headedness and seizures.  Hematological: Negative for adenopathy. Does not bruise/bleed easily.  Psychiatric/Behavioral: Negative for confusion, depression and sleep disturbance. The patient is not nervous/anxious.      PHYSICAL EXAMINATION:  Blood pressure (!) 145/74, pulse 72, temperature 97.8 F (36.6 C), temperature source Temporal, resp. rate 18, height '5\' 8"'  (1.727 m), weight 198 lb 11.2 oz (90.1 kg), SpO2 100 %.  ECOG PERFORMANCE STATUS: 1 - Symptomatic but completely ambulatory  Physical Exam  Constitutional: Oriented to person, place, and time and well-developed, well-nourished, and in no distress.  HENT:  Head: Normocephalic and atraumatic.  Mouth/Throat: Oropharynx is clear and moist. No oropharyngeal exudate.  Eyes: Conjunctivae are normal. Right eye exhibits no discharge. Left eye exhibits no discharge. No scleral icterus.  Neck: Normal range of motion. Neck supple.  Cardiovascular: Normal rate, regular rhythm, normal heart sounds and intact distal pulses.   Pulmonary/Chest: Effort normal and breath sounds normal. No respiratory distress. No wheezes. No rales.  Abdominal: Soft. Bowel sounds are normal. Exhibits no distension and no mass. There is no tenderness.   Musculoskeletal: Mild bilateral lower extremity swelling. Normal range of motion.  Lymphadenopathy:    No cervical adenopathy.  Neurological: Alert and oriented to person, place, and time. Exhibits normal muscle tone. Gait normal. Coordination normal.  Skin: Skin is warm and dry. No rash noted. Not diaphoretic. No erythema. No pallor.  Psychiatric: Mood, memory and judgment normal.  Vitals reviewed.  LABORATORY DATA: Lab Results  Component Value Date   WBC 4.6 08/11/2019   HGB 11.0 (L) 08/11/2019   HCT 33.1 (L) 08/11/2019   MCV 95.7 08/11/2019   PLT 61 (L) 08/11/2019      Chemistry      Component Value Date/Time   NA 140 08/11/2019 0820   K 4.5 08/11/2019 0820   CL 107 08/11/2019 0820   CO2 24 08/11/2019 0820   BUN 17 08/11/2019 0820   CREATININE 1.24 (H) 08/11/2019 0820      Component Value Date/Time   CALCIUM 9.1 08/11/2019 0820   ALKPHOS 70 08/11/2019 0820   AST 16 08/11/2019 0820   ALT 9 08/11/2019 0820   BILITOT 0.6 08/11/2019 0820       RADIOGRAPHIC STUDIES:  No results found.   ASSESSMENT/PLAN:  This is a very pleasant 69 years old African female originally from [place] who is visiting her son in [place] and was diagnosed with IgA multiple myeloma.  The patient was a started initially on treatment with weekly subcutaneous Velcade 1.3 mg/M2, Revlimid 25  mg p.o. daily for 21 days every 4 weeks as well as weekly Decadron 40 mg orally. She was tolerating the treatment well but she developed significant skin rash secondary to treatment with Revlimid and this was discontinued.  The patient continued treatment with subcutaneous Velcade and Decadron.She developed evidence of disease progression in February 2021. Her treatment was discontinued.  The patient was then started on Pomalyst 4 mg p.o. daily for 21 days on every 4 weeks with weekly Decadron 40 mg p.o. She is status post 6 days of treatment. She developed a significant skin  rash/reaction to Pomalyst.This was discontinued.  The patient is currently undergoing treatment withCarfilzomibon days 1, 2, 8, 9, 15, and 16 and Cytoxan on days 1, 8, and 15 IV every 4 weeks with 40 mg p.o. weekly of Decadron.She is status post 3 cycles.   The patient had a myeloma panel recently performed.  Dr. Julien Nordmann reviewed the results with the patient today.  The patient's results showed improvement in her disease.  Dr. Julien Nordmann recommends that she continue on the same treatment at the same dose. She is ok to treat with her platelets today per Dr. Julien Nordmann.  We will see her back for follow-up visit in 3 weeks for evaluation before starting cycle #5.   The patient was advised to call immediately if she has any concerning symptoms in the interval. The patient voices understanding of current disease status and treatment options and is in agreement with the current care plan. All questions were answered. The patient knows to call the clinic with any problems, questions or concerns. We can certainly see the patient much sooner if necessary  No orders of the defined types were placed in this encounter.   Chatham Howington L Shannan Garfinkel, PA-C 08/11/19  ADDENDUM: Hematology/Oncology Attending: I had a face-to-face encounter with the patient today.  I recommended her care plan.  This is a very pleasant 69 years old African female from [place] who was diagnosed with multiple myeloma, status post first-line treatment with subcutaneous Velcade, Revlimid and Decadron before Revlimid was discontinued secondary to hypersensitivity reaction and significant skin rash.  The patient had disease progression in February 2021 and she is currently undergoing treatment with carfilzomib, Cytoxan and Decadron status post 3 cycles.  She is tolerating her treatment well and feeling much better. She had repeat myeloma panel performed last week.  I discussed the lab results with the patient.  Her myeloma panel showed  improvement of her disease with decrease in the IgA as well as the free lambda light chain. I recommended for the patient to continue her current treatment with chemotherapy with carfilzomib, Cytoxan and Decadron. She will come back for follow-up visit in 3 weeks for evaluation before starting the next cycle of her treatment. The patient was advised to call immediately if she has any concerning symptoms in the interval. Disclaimer: This note was dictated with voice recognition software. Similar sounding words can inadvertently be transcribed and may be missed upon review. Eilleen Kempf, MD 08/11/19

## 2019-08-11 ENCOUNTER — Other Ambulatory Visit: Payer: Self-pay

## 2019-08-11 ENCOUNTER — Inpatient Hospital Stay (HOSPITAL_BASED_OUTPATIENT_CLINIC_OR_DEPARTMENT_OTHER): Payer: Self-pay | Admitting: Physician Assistant

## 2019-08-11 ENCOUNTER — Inpatient Hospital Stay: Payer: Self-pay

## 2019-08-11 DIAGNOSIS — Z5111 Encounter for antineoplastic chemotherapy: Secondary | ICD-10-CM

## 2019-08-11 DIAGNOSIS — C9000 Multiple myeloma not having achieved remission: Secondary | ICD-10-CM

## 2019-08-11 DIAGNOSIS — T7840XA Allergy, unspecified, initial encounter: Secondary | ICD-10-CM

## 2019-08-11 LAB — CBC WITH DIFFERENTIAL (CANCER CENTER ONLY)
Abs Immature Granulocytes: 0.01 10*3/uL (ref 0.00–0.07)
Basophils Absolute: 0 10*3/uL (ref 0.0–0.1)
Basophils Relative: 0 %
Eosinophils Absolute: 0.1 10*3/uL (ref 0.0–0.5)
Eosinophils Relative: 1 %
HCT: 33.1 % — ABNORMAL LOW (ref 36.0–46.0)
Hemoglobin: 11 g/dL — ABNORMAL LOW (ref 12.0–15.0)
Immature Granulocytes: 0 %
Lymphocytes Relative: 13 %
Lymphs Abs: 0.6 10*3/uL — ABNORMAL LOW (ref 0.7–4.0)
MCH: 31.8 pg (ref 26.0–34.0)
MCHC: 33.2 g/dL (ref 30.0–36.0)
MCV: 95.7 fL (ref 80.0–100.0)
Monocytes Absolute: 0.3 10*3/uL (ref 0.1–1.0)
Monocytes Relative: 6 %
Neutro Abs: 3.6 10*3/uL (ref 1.7–7.7)
Neutrophils Relative %: 80 %
Platelet Count: 61 10*3/uL — ABNORMAL LOW (ref 150–400)
RBC: 3.46 MIL/uL — ABNORMAL LOW (ref 3.87–5.11)
RDW: 13.5 % (ref 11.5–15.5)
WBC Count: 4.6 10*3/uL (ref 4.0–10.5)
nRBC: 0 % (ref 0.0–0.2)

## 2019-08-11 LAB — CMP (CANCER CENTER ONLY)
ALT: 9 U/L (ref 0–44)
AST: 16 U/L (ref 15–41)
Albumin: 3.7 g/dL (ref 3.5–5.0)
Alkaline Phosphatase: 70 U/L (ref 38–126)
Anion gap: 9 (ref 5–15)
BUN: 17 mg/dL (ref 8–23)
CO2: 24 mmol/L (ref 22–32)
Calcium: 9.1 mg/dL (ref 8.9–10.3)
Chloride: 107 mmol/L (ref 98–111)
Creatinine: 1.24 mg/dL — ABNORMAL HIGH (ref 0.44–1.00)
GFR, Est AFR Am: 51 mL/min — ABNORMAL LOW (ref >60)
GFR, Estimated: 44 mL/min — ABNORMAL LOW (ref >60)
Glucose, Bld: 95 mg/dL (ref 70–99)
Potassium: 4.5 mmol/L (ref 3.5–5.1)
Sodium: 140 mmol/L (ref 135–145)
Total Bilirubin: 0.6 mg/dL (ref 0.3–1.2)
Total Protein: 6.6 g/dL (ref 6.5–8.1)

## 2019-08-11 LAB — LACTATE DEHYDROGENASE: LDH: 367 U/L — ABNORMAL HIGH (ref 98–192)

## 2019-08-11 MED ORDER — DEXAMETHASONE 4 MG PO TABS: 40.0000 mg | ORAL_TABLET | ORAL | 3 refills | Status: DC

## 2019-08-11 MED ORDER — SODIUM CHLORIDE 0.9 % IV SOLN
Freq: Once | INTRAVENOUS | Status: AC
  Filled 2019-08-11: qty 250

## 2019-08-11 MED ORDER — SODIUM CHLORIDE 0.9% FLUSH
10.0000 mL | INTRAVENOUS | Status: DC | PRN
  Administered 2019-08-11: 10 mL
  Filled 2019-08-11: qty 10

## 2019-08-11 MED ORDER — HEPARIN SOD (PORK) LOCK FLUSH 100 UNIT/ML IV SOLN
500.0000 [IU] | Freq: Once | INTRAVENOUS | Status: AC | PRN
  Administered 2019-08-11: 500 [IU]
  Filled 2019-08-11: qty 5

## 2019-08-11 MED ORDER — DEXTROSE 5 % IV SOLN
36.0000 mg/m2 | Freq: Once | INTRAVENOUS | Status: AC
  Administered 2019-08-11: 80 mg via INTRAVENOUS
  Filled 2019-08-11: qty 10

## 2019-08-11 MED ORDER — PALONOSETRON HCL INJECTION 0.25 MG/5ML
INTRAVENOUS | Status: AC
  Filled 2019-08-11: qty 5

## 2019-08-11 MED ORDER — SODIUM CHLORIDE 0.9 % IV SOLN
300.0000 mg/m2 | Freq: Once | INTRAVENOUS | Status: AC
  Administered 2019-08-11: 640 mg via INTRAVENOUS
  Filled 2019-08-11: qty 32

## 2019-08-11 MED ORDER — PALONOSETRON HCL INJECTION 0.25 MG/5ML
0.2500 mg | Freq: Once | INTRAVENOUS | Status: AC
  Administered 2019-08-11: 0.25 mg via INTRAVENOUS

## 2019-08-11 MED FILL — DEXAMETHASONE 4 MG TABLET: 4 | 28 days supply | Qty: 40 | Fill #0

## 2019-08-11 NOTE — Progress Notes (Signed)
Per Cassandra H., PA, OK to treat with low platelets.

## 2019-08-11 NOTE — Patient Instructions (Signed)
Tunneled Central Venous Catheter Flushing Guide  It is important to flush your tunneled central venous catheter each time you use it, both before and after you use it. Flushing your catheter will help prevent it from clogging. What are the risks? Risks may include:  Infection.  Air getting into the catheter and bloodstream. Supplies needed:  A clean pair of gloves.  A disinfecting wipe. Use an alcohol wipe, chlorhexidine wipe, or iodine wipe as told by your health care provider.  A 10 mL syringe that has been prefilled with saline solution.  An empty 10 mL syringe, if a substance called heparin was injected into your catheter. How to flush your catheter When you flush your catheter, make sure you follow any specific instructions from your health care provider or the manufacturer. These are general guidelines. Flushing your catheter before use If there is heparin in your catheter: 1. Wash your hands with soap and water. 2. Put on gloves. 3. Scrub the injection cap for a minimum of 15 seconds with a disinfecting wipe. 4. Unclamp the catheter. 5. Attach the empty syringe to the injection cap. 6. Pull the syringe plunger back and withdraw 10 mL of blood. 7. Place the syringe into an appropriate waste container. 8. Scrub the injection cap for 15 seconds with a disinfecting wipe. 9. Attach the prefilled syringe to the injection cap. 10. Flush the catheter by pushing the plunger forward until all the liquid from the syringe is in the catheter. 11. Remove the syringe from the injection cap. 12. Clamp the catheter. If there is no heparin in your catheter: 1. Wash your hands with soap and water. 2. Put on gloves. 3. Scrub the injection cap for 15 seconds with a disinfecting wipe. 4. Unclamp the catheter. 5. Attach the prefilled syringe to the injection cap. 6. Flush the catheter by pushing the plunger forward until 5 mL of the liquid from the syringe is in the catheter. 7. Pull back on  the syringe until you see blood in the catheter. 8. If you have been asked to collect any blood, follow your health care provider's instructions. Otherwise, flush the catheter with the rest of the solution from the syringe. 9. Remove the syringe from the injection cap. 10. Clamp the catheter.  Flushing your catheter after use 1. Wash your hands with soap and water. 2. Put on gloves. 3. Scrub the injection cap for 15 seconds with a disinfecting wipe. 4. Unclamp the catheter. 5. Attach the prefilled syringe to the injection cap. 6. Flush the catheter by pushing the plunger forward until all of the liquid from the syringe is in the catheter. 7. Remove the syringe from the injection cap. 8. Clamp the catheter. Problems and solutions  If blood cannot be completely cleared from the injection cap, you may need to have the injection cap replaced.  If the catheter is difficult to flush, use the pulsing method. The pulsing method involves pushing only a few milliliters of solution into the catheter at a time and pausing between pushes.  If you do not see blood in the catheter when you pull back on the syringe, change your body position, such as by raising your arms above your head. Take a deep breath and cough. Then, pull back on the syringe. If you still do not see blood, flush the catheter with a small amount of solution. Then, change positions again and take a breath or cough. Pull back on the syringe again. If you still do not see   blood, finish flushing the catheter and contact your health care provider. Do not use your catheter until your health care provider says it is okay. General tips  Have someone help you flush your catheter, if possible.  Do not force fluid through your catheter.  Do not use a syringe that is larger or smaller than 10 mL. Using a smaller syringe can make the catheter burst.  Do not use your catheter without flushing it first if it has heparin in it. Contact a health  care provider if:  You cannot see any blood in the catheter when you flush it before using it.  Your catheter is difficult to flush. Get help right away if:  You cannot flush the catheter.  The catheter leaks when you flush it or when there is fluid in it.  There are cracks or breaks in the catheter. Summary  It is important to flush your tunneled central venous catheter each time you use it, both before and after you use it.  Scrub the injection cap for 15 seconds with a disinfecting wipe before and after you flush it.  When you flush your catheter, make sure you follow any specific instructions from your health care provider or the manufacturer.  Get help right away if you cannot flush the catheter. This information is not intended to replace advice given to you by your health care provider. Make sure you discuss any questions you have with your health care provider. Document Revised: 11/05/2018 Document Reviewed: 04/28/2018 Elsevier Patient Education  2020 [employer]  

## 2019-08-11 NOTE — Patient Instructions (Signed)
[facility] Discharge Instructions for Patients Receiving Chemotherapy  Today you received the following chemotherapy agents: Kyprolis, Cytoxan   To help prevent nausea and vomiting after your treatment, we encourage you to take your nausea medication as directed.    If you develop nausea and vomiting that is not controlled by your nausea medication, call the clinic.   BELOW ARE SYMPTOMS THAT SHOULD BE REPORTED IMMEDIATELY:  *FEVER GREATER THAN 100.5 F  *CHILLS WITH OR WITHOUT FEVER  NAUSEA AND VOMITING THAT IS NOT CONTROLLED WITH YOUR NAUSEA MEDICATION  *UNUSUAL SHORTNESS OF BREATH  *UNUSUAL BRUISING OR BLEEDING  TENDERNESS IN MOUTH AND THROAT WITH OR WITHOUT PRESENCE OF ULCERS  *URINARY PROBLEMS  *BOWEL PROBLEMS  UNUSUAL RASH Items with * indicate a potential emergency and should be followed up as soon as possible.  Feel free to call the clinic should you have any questions or concerns. The clinic phone number is [phone].  Please show the CHEMO ALERT CARD at check-in to the Emergency Department and triage nurse.   

## 2019-08-12 ENCOUNTER — Telehealth: Payer: Self-pay | Admitting: Medical Oncology

## 2019-08-12 ENCOUNTER — Other Ambulatory Visit: Payer: Self-pay | Admitting: Medical Oncology

## 2019-08-12 ENCOUNTER — Encounter: Payer: Self-pay | Admitting: Medical Oncology

## 2019-08-12 ENCOUNTER — Other Ambulatory Visit: Payer: Self-pay

## 2019-08-12 ENCOUNTER — Inpatient Hospital Stay: Payer: Self-pay

## 2019-08-12 VITALS — BP 159/85 | HR 72 | Temp 98.6°F | Resp 18

## 2019-08-12 DIAGNOSIS — T7840XA Allergy, unspecified, initial encounter: Secondary | ICD-10-CM

## 2019-08-12 DIAGNOSIS — K219 Gastro-esophageal reflux disease without esophagitis: Secondary | ICD-10-CM

## 2019-08-12 DIAGNOSIS — C9000 Multiple myeloma not having achieved remission: Secondary | ICD-10-CM

## 2019-08-12 MED ORDER — OMEPRAZOLE 20 MG PO CPDR: 20.0000 mg | DELAYED_RELEASE_CAPSULE | Freq: Every day | ORAL | 3 refills | Status: DC

## 2019-08-12 MED ORDER — HEPARIN SOD (PORK) LOCK FLUSH 100 UNIT/ML IV SOLN
500.0000 [IU] | Freq: Once | INTRAVENOUS | Status: AC | PRN
  Administered 2019-08-12: 500 [IU]
  Filled 2019-08-12: qty 5

## 2019-08-12 MED ORDER — SODIUM CHLORIDE 0.9% FLUSH
10.0000 mL | INTRAVENOUS | Status: DC | PRN
  Administered 2019-08-12: 10 mL
  Filled 2019-08-12: qty 10

## 2019-08-12 MED ORDER — SODIUM CHLORIDE 0.9 % IV SOLN
Freq: Once | INTRAVENOUS | Status: DC
  Filled 2019-08-12: qty 250

## 2019-08-12 MED ORDER — DEXTROSE 5 % IV SOLN
36.0000 mg/m2 | Freq: Once | INTRAVENOUS | Status: AC
  Administered 2019-08-12: 80 mg via INTRAVENOUS
  Filled 2019-08-12: qty 10

## 2019-08-12 MED ORDER — SODIUM CHLORIDE 0.9 % IV SOLN
10.0000 mg | Freq: Once | INTRAVENOUS | Status: AC
  Administered 2019-08-12: 10 mg via INTRAVENOUS
  Filled 2019-08-12: qty 10

## 2019-08-12 MED ORDER — DEXAMETHASONE SODIUM PHOSPHATE 10 MG/ML IJ SOLN: 10.0000 mg | Freq: Once | INTRAMUSCULAR | Status: DC

## 2019-08-12 MED ORDER — SODIUM CHLORIDE 0.9 % IV SOLN
Freq: Once | INTRAVENOUS | Status: AC
  Filled 2019-08-12: qty 250

## 2019-08-12 MED ORDER — ACYCLOVIR 200 MG PO CAPS: 200.0000 mg | ORAL_CAPSULE | Freq: Two times a day (BID) | ORAL | 2 refills | Status: DC

## 2019-08-12 MED FILL — OMEPRAZOLE 20 MG CAP: 20 | 30 days supply | Qty: 30 | Fill #1

## 2019-08-12 MED FILL — ACYCLOVIR 200 MG CAP: 200 | 30 days supply | Qty: 60 | Fill #1

## 2019-08-12 NOTE — Patient Instructions (Signed)
[facility] Discharge Instructions for Patients Receiving Chemotherapy  Today you received the following chemotherapy agents:  Kyprolis  To help prevent nausea and vomiting after your treatment, we encourage you to take your nausea medication as directed.   If you develop nausea and vomiting that is not controlled by your nausea medication, call the clinic.   BELOW ARE SYMPTOMS THAT SHOULD BE REPORTED IMMEDIATELY:  *FEVER GREATER THAN 100.5 F  *CHILLS WITH OR WITHOUT FEVER  NAUSEA AND VOMITING THAT IS NOT CONTROLLED WITH YOUR NAUSEA MEDICATION  *UNUSUAL SHORTNESS OF BREATH  *UNUSUAL BRUISING OR BLEEDING  TENDERNESS IN MOUTH AND THROAT WITH OR WITHOUT PRESENCE OF ULCERS  *URINARY PROBLEMS  *BOWEL PROBLEMS  UNUSUAL RASH Items with * indicate a potential emergency and should be followed up as soon as possible.  Feel free to call the clinic should you have any questions or concerns. The clinic phone number is [phone].  Please show the CHEMO ALERT CARD at check-in to the Emergency Department and triage nurse.   

## 2019-08-12 NOTE — Telephone Encounter (Signed)
Requests letter to extend pts VISA. Letter drafted for [facility] to review.

## 2019-08-12 NOTE — Telephone Encounter (Signed)
Son said pt needs these refilled. : Prilosec and acyclovir.

## 2019-08-15 ENCOUNTER — Telehealth: Payer: Self-pay | Admitting: Medical Oncology

## 2019-08-15 NOTE — Telephone Encounter (Signed)
Letter for VISA left up front for pt to pick up this week.

## 2019-08-18 ENCOUNTER — Inpatient Hospital Stay: Payer: Self-pay

## 2019-08-18 ENCOUNTER — Other Ambulatory Visit: Payer: Self-pay

## 2019-08-18 VITALS — BP 153/87 | HR 65 | Temp 99.0°F | Resp 18 | Ht 68.0 in | Wt 199.2 lb

## 2019-08-18 DIAGNOSIS — Z5111 Encounter for antineoplastic chemotherapy: Secondary | ICD-10-CM

## 2019-08-18 DIAGNOSIS — C9000 Multiple myeloma not having achieved remission: Secondary | ICD-10-CM

## 2019-08-18 DIAGNOSIS — Z95828 Presence of other vascular implants and grafts: Secondary | ICD-10-CM

## 2019-08-18 LAB — CBC WITH DIFFERENTIAL (CANCER CENTER ONLY)
Abs Immature Granulocytes: 0.01 10*3/uL (ref 0.00–0.07)
Basophils Absolute: 0 10*3/uL (ref 0.0–0.1)
Basophils Relative: 1 %
Eosinophils Absolute: 0.2 10*3/uL (ref 0.0–0.5)
Eosinophils Relative: 3 %
HCT: 31.9 % — ABNORMAL LOW (ref 36.0–46.0)
Hemoglobin: 10.6 g/dL — ABNORMAL LOW (ref 12.0–15.0)
Immature Granulocytes: 0 %
Lymphocytes Relative: 10 %
Lymphs Abs: 0.4 10*3/uL — ABNORMAL LOW (ref 0.7–4.0)
MCH: 32.4 pg (ref 26.0–34.0)
MCHC: 33.2 g/dL (ref 30.0–36.0)
MCV: 97.6 fL (ref 80.0–100.0)
Monocytes Absolute: 0.3 10*3/uL (ref 0.1–1.0)
Monocytes Relative: 6 %
Neutro Abs: 3.5 10*3/uL (ref 1.7–7.7)
Neutrophils Relative %: 80 %
Platelet Count: 83 10*3/uL — ABNORMAL LOW (ref 150–400)
RBC: 3.27 MIL/uL — ABNORMAL LOW (ref 3.87–5.11)
RDW: 13.5 % (ref 11.5–15.5)
WBC Count: 4.4 10*3/uL (ref 4.0–10.5)
nRBC: 0.9 % — ABNORMAL HIGH (ref 0.0–0.2)

## 2019-08-18 LAB — CMP (CANCER CENTER ONLY)
ALT: 11 U/L (ref 0–44)
AST: 14 U/L — ABNORMAL LOW (ref 15–41)
Albumin: 3.6 g/dL (ref 3.5–5.0)
Alkaline Phosphatase: 72 U/L (ref 38–126)
Anion gap: 10 (ref 5–15)
BUN: 16 mg/dL (ref 8–23)
CO2: 23 mmol/L (ref 22–32)
Calcium: 8.7 mg/dL — ABNORMAL LOW (ref 8.9–10.3)
Chloride: 107 mmol/L (ref 98–111)
Creatinine: 1.33 mg/dL — ABNORMAL HIGH (ref 0.44–1.00)
GFR, Est AFR Am: 47 mL/min — ABNORMAL LOW (ref >60)
GFR, Estimated: 41 mL/min — ABNORMAL LOW (ref >60)
Glucose, Bld: 110 mg/dL — ABNORMAL HIGH (ref 70–99)
Potassium: 4.7 mmol/L (ref 3.5–5.1)
Sodium: 140 mmol/L (ref 135–145)
Total Bilirubin: 0.5 mg/dL (ref 0.3–1.2)
Total Protein: 6.5 g/dL (ref 6.5–8.1)

## 2019-08-18 LAB — LACTATE DEHYDROGENASE: LDH: 357 U/L — ABNORMAL HIGH (ref 98–192)

## 2019-08-18 MED ORDER — SODIUM CHLORIDE 0.9% FLUSH
10.0000 mL | INTRAVENOUS | Status: DC | PRN
  Administered 2019-08-18: 10 mL
  Filled 2019-08-18: qty 10

## 2019-08-18 MED ORDER — HEPARIN SOD (PORK) LOCK FLUSH 100 UNIT/ML IV SOLN
500.0000 [IU] | Freq: Once | INTRAVENOUS | Status: AC | PRN
  Administered 2019-08-18: 500 [IU]
  Filled 2019-08-18: qty 5

## 2019-08-18 MED ORDER — SODIUM CHLORIDE 0.9 % IV SOLN
300.0000 mg/m2 | Freq: Once | INTRAVENOUS | Status: AC
  Administered 2019-08-18: 640 mg via INTRAVENOUS
  Filled 2019-08-18: qty 32

## 2019-08-18 MED ORDER — DEXTROSE 5 % IV SOLN
36.0000 mg/m2 | Freq: Once | INTRAVENOUS | Status: AC
  Administered 2019-08-18: 80 mg via INTRAVENOUS
  Filled 2019-08-18: qty 30

## 2019-08-18 MED ORDER — PALONOSETRON HCL INJECTION 0.25 MG/5ML
0.2500 mg | Freq: Once | INTRAVENOUS | Status: AC
  Administered 2019-08-18: 0.25 mg via INTRAVENOUS

## 2019-08-18 MED ORDER — PALONOSETRON HCL INJECTION 0.25 MG/5ML
INTRAVENOUS | Status: AC
  Filled 2019-08-18: qty 5

## 2019-08-18 MED ORDER — SODIUM CHLORIDE 0.9 % IV SOLN
Freq: Once | INTRAVENOUS | Status: AC
  Filled 2019-08-18: qty 250

## 2019-08-18 NOTE — Progress Notes (Signed)
Patient did not want to stay accessed overnight and I also educated her on taking the band aid off after treatment because leaving the band aid on will cause issues for the skin.

## 2019-08-18 NOTE — Progress Notes (Signed)
Per Cassandra Heilingoetter, PA - okay proceed with treatment today with platelet count of 83.

## 2019-08-18 NOTE — Patient Instructions (Signed)

## 2019-08-18 NOTE — Patient Instructions (Addendum)
[facility] Discharge Instructions for Patients Receiving Chemotherapy  Today you received the following chemotherapy agents: Kyprolis and Cytoxan  To help prevent nausea and vomiting after your treatment, we encourage you to take your nausea medication as prescribed.    If you develop nausea and vomiting that is not controlled by your nausea medication, call the clinic.   BELOW ARE SYMPTOMS THAT SHOULD BE REPORTED IMMEDIATELY:  *FEVER GREATER THAN 100.5 F  *CHILLS WITH OR WITHOUT FEVER  NAUSEA AND VOMITING THAT IS NOT CONTROLLED WITH YOUR NAUSEA MEDICATION  *UNUSUAL SHORTNESS OF BREATH  *UNUSUAL BRUISING OR BLEEDING  TENDERNESS IN MOUTH AND THROAT WITH OR WITHOUT PRESENCE OF ULCERS  *URINARY PROBLEMS  *BOWEL PROBLEMS  UNUSUAL RASH Items with * indicate a potential emergency and should be followed up as soon as possible.  Feel free to call the clinic should you have any questions or concerns. The clinic phone number is [phone].  Please show the CHEMO ALERT CARD at check-in to the Emergency Department and triage nurse.   

## 2019-08-19 ENCOUNTER — Inpatient Hospital Stay: Payer: Self-pay

## 2019-08-19 VITALS — BP 158/78 | HR 64 | Temp 98.9°F | Resp 18

## 2019-08-19 DIAGNOSIS — C9000 Multiple myeloma not having achieved remission: Secondary | ICD-10-CM

## 2019-08-19 MED ORDER — SODIUM CHLORIDE 0.9 % IV SOLN
Freq: Once | INTRAVENOUS | Status: DC
  Filled 2019-08-19: qty 250

## 2019-08-19 MED ORDER — SODIUM CHLORIDE 0.9 % IV SOLN
10.0000 mg | Freq: Once | INTRAVENOUS | Status: AC
  Administered 2019-08-19: 10 mg via INTRAVENOUS
  Filled 2019-08-19: qty 10

## 2019-08-19 MED ORDER — SODIUM CHLORIDE 0.9% FLUSH
10.0000 mL | INTRAVENOUS | Status: DC | PRN
  Administered 2019-08-19: 10 mL
  Filled 2019-08-19: qty 10

## 2019-08-19 MED ORDER — HEPARIN SOD (PORK) LOCK FLUSH 100 UNIT/ML IV SOLN
500.0000 [IU] | Freq: Once | INTRAVENOUS | Status: AC | PRN
  Administered 2019-08-19: 500 [IU]
  Filled 2019-08-19: qty 5

## 2019-08-19 MED ORDER — DEXAMETHASONE SODIUM PHOSPHATE 10 MG/ML IJ SOLN: 10.0000 mg | Freq: Once | INTRAMUSCULAR | Status: DC

## 2019-08-19 MED ORDER — DEXTROSE 5 % IV SOLN
36.0000 mg/m2 | Freq: Once | INTRAVENOUS | Status: AC
  Administered 2019-08-19: 80 mg via INTRAVENOUS
  Filled 2019-08-19: qty 10

## 2019-08-19 MED ORDER — SODIUM CHLORIDE 0.9 % IV SOLN
Freq: Once | INTRAVENOUS | Status: AC
  Filled 2019-08-19: qty 250

## 2019-08-19 NOTE — Patient Instructions (Signed)
[facility] Discharge Instructions for Patients Receiving Chemotherapy  Today you received the following chemotherapy agents: Kyprolis and Cytoxan  To help prevent nausea and vomiting after your treatment, we encourage you to take your nausea medication as prescribed.    If you develop nausea and vomiting that is not controlled by your nausea medication, call the clinic.   BELOW ARE SYMPTOMS THAT SHOULD BE REPORTED IMMEDIATELY:  *FEVER GREATER THAN 100.5 F  *CHILLS WITH OR WITHOUT FEVER  NAUSEA AND VOMITING THAT IS NOT CONTROLLED WITH YOUR NAUSEA MEDICATION  *UNUSUAL SHORTNESS OF BREATH  *UNUSUAL BRUISING OR BLEEDING  TENDERNESS IN MOUTH AND THROAT WITH OR WITHOUT PRESENCE OF ULCERS  *URINARY PROBLEMS  *BOWEL PROBLEMS  UNUSUAL RASH Items with * indicate a potential emergency and should be followed up as soon as possible.  Feel free to call the clinic should you have any questions or concerns. The clinic phone number is [phone].  Please show the CHEMO ALERT CARD at check-in to the Emergency Department and triage nurse.   

## 2019-08-31 ENCOUNTER — Inpatient Hospital Stay: Payer: Self-pay | Attending: Internal Medicine

## 2019-08-31 ENCOUNTER — Inpatient Hospital Stay: Payer: Self-pay

## 2019-08-31 ENCOUNTER — Other Ambulatory Visit: Payer: Self-pay

## 2019-08-31 DIAGNOSIS — Z5111 Encounter for antineoplastic chemotherapy: Secondary | ICD-10-CM | POA: Insufficient documentation

## 2019-08-31 DIAGNOSIS — R21 Rash and other nonspecific skin eruption: Secondary | ICD-10-CM | POA: Insufficient documentation

## 2019-08-31 DIAGNOSIS — M25559 Pain in unspecified hip: Secondary | ICD-10-CM | POA: Insufficient documentation

## 2019-08-31 DIAGNOSIS — Z7952 Long term (current) use of systemic steroids: Secondary | ICD-10-CM | POA: Insufficient documentation

## 2019-08-31 DIAGNOSIS — Z95828 Presence of other vascular implants and grafts: Secondary | ICD-10-CM

## 2019-08-31 DIAGNOSIS — C9000 Multiple myeloma not having achieved remission: Secondary | ICD-10-CM

## 2019-08-31 DIAGNOSIS — Z79899 Other long term (current) drug therapy: Secondary | ICD-10-CM | POA: Insufficient documentation

## 2019-08-31 DIAGNOSIS — G629 Polyneuropathy, unspecified: Secondary | ICD-10-CM | POA: Insufficient documentation

## 2019-08-31 LAB — CMP (CANCER CENTER ONLY)
ALT: 10 U/L (ref 0–44)
AST: 17 U/L (ref 15–41)
Albumin: 3.5 g/dL (ref 3.5–5.0)
Alkaline Phosphatase: 59 U/L (ref 38–126)
Anion gap: 11 (ref 5–15)
BUN: 17 mg/dL (ref 8–23)
CO2: 23 mmol/L (ref 22–32)
Calcium: 8.8 mg/dL — ABNORMAL LOW (ref 8.9–10.3)
Chloride: 106 mmol/L (ref 98–111)
Creatinine: 1.26 mg/dL — ABNORMAL HIGH (ref 0.44–1.00)
GFR, Est AFR Am: 50 mL/min — ABNORMAL LOW (ref >60)
GFR, Estimated: 43 mL/min — ABNORMAL LOW (ref >60)
Glucose, Bld: 95 mg/dL (ref 70–99)
Potassium: 4.2 mmol/L (ref 3.5–5.1)
Sodium: 140 mmol/L (ref 135–145)
Total Bilirubin: 0.4 mg/dL (ref 0.3–1.2)
Total Protein: 6.4 g/dL — ABNORMAL LOW (ref 6.5–8.1)

## 2019-08-31 LAB — CBC WITH DIFFERENTIAL (CANCER CENTER ONLY)
Abs Immature Granulocytes: 0.01 10*3/uL (ref 0.00–0.07)
Basophils Absolute: 0 10*3/uL (ref 0.0–0.1)
Basophils Relative: 1 %
Eosinophils Absolute: 0.2 10*3/uL (ref 0.0–0.5)
Eosinophils Relative: 6 %
HCT: 32 % — ABNORMAL LOW (ref 36.0–46.0)
Hemoglobin: 10.7 g/dL — ABNORMAL LOW (ref 12.0–15.0)
Immature Granulocytes: 0 %
Lymphocytes Relative: 23 %
Lymphs Abs: 0.7 10*3/uL (ref 0.7–4.0)
MCH: 32.7 pg (ref 26.0–34.0)
MCHC: 33.4 g/dL (ref 30.0–36.0)
MCV: 97.9 fL (ref 80.0–100.0)
Monocytes Absolute: 0.5 10*3/uL (ref 0.1–1.0)
Monocytes Relative: 16 %
Neutro Abs: 1.6 10*3/uL — ABNORMAL LOW (ref 1.7–7.7)
Neutrophils Relative %: 54 %
Platelet Count: 133 10*3/uL — ABNORMAL LOW (ref 150–400)
RBC: 3.27 MIL/uL — ABNORMAL LOW (ref 3.87–5.11)
RDW: 13.4 % (ref 11.5–15.5)
WBC Count: 3 10*3/uL — ABNORMAL LOW (ref 4.0–10.5)
nRBC: 0 % (ref 0.0–0.2)

## 2019-08-31 LAB — LACTATE DEHYDROGENASE: LDH: 236 U/L — ABNORMAL HIGH (ref 98–192)

## 2019-08-31 MED ORDER — SODIUM CHLORIDE 0.9% FLUSH
10.0000 mL | INTRAVENOUS | Status: DC | PRN
  Administered 2019-08-31: 10 mL
  Filled 2019-08-31: qty 10

## 2019-08-31 NOTE — Progress Notes (Signed)
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  PRIOR THERAPY: 1)Systemic therapy with Velcade 1.3 mg/M2 weekly, Revlimid 25 mg p.o. daily for 14 days every 3 weeks in addition to weekly Decadron 40 mg orally. First dose of treatment 10/08/2017.Treatment was placed on hold due to development of a significant rash.Sheresumedtreatment withonlydexamethasone and Velcade on 11/19/2017.This was discontinued on 04/14/2019 due to evidence of disease progression. 2)Pomalyst 4 mg p.o. for 21 days every 4 weeks and 40 mg p.o. Decadron once a week. First dose3/06/2019-05/04/2019.Status post 6 days of treatment. Discontinued secondary to allergy to Pomalyst.  CURRENT THERAPY: Chemotherapy with carfilzomibon days 1, 2, 8, 9, 15, and 16and Cytoxan 300 mg/m2 on days 1,8, and 15 IV every 4 weeks and 40 mg p.o. weekly of Decadron. First dose expected on 05/12/2019. Status post 4 cycles.  INTERVAL HISTORY: Debbie Chan 69 y.o. female returns to the clinic today for a follow-up visit.  The patient is feeling fair today without any concerning complaints. She denies any recent fever, chills, night sweats, or weight loss.  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  Gabapentin was discontinued secondary to extremity swelling.  She denies any nausea, vomiting, diarrhea, or constipation.  She denies any abnormal bleeding or bruising.  She denies any signs and symptoms of infection including sore throat, nasal congestion, cough, shortness of breath, skin infections, or dysuria. She still has persistent hip pain that radiates to her legs. She also has occasional achyness in her neck. She does not take any tylenol because she states she is already on too many medications. She states her toe nails fell off several months ago. She is here today for  evaluation before starting day 1 of cycle #5.   MEDICAL HISTORY: Past Medical History:  Diagnosis Date  . Cancer ([place])     ALLERGIES:  has No Known Allergies.  MEDICATIONS:  Current Outpatient Medications  Medication Sig Dispense Refill  . acyclovir (ZOVIRAX) 200 MG capsule Take 1 capsule (200 mg total) by mouth 2 (two) times daily. 60 capsule 2  . dexamethasone (DECADRON) 4 MG tablet Take 10 tablets (40 mg total) by mouth every 7 (seven) days. 40 tablet 3  . omeprazole (PRILOSEC) 20 MG capsule Take 1 capsule (20 mg total) by mouth daily. 30 capsule 3  . blood glucose meter kit and supplies Dispense based on patient and insurance preference. Use up to four times daily as directed. (FOR ICD-10 E10.9, E11.9). (Patient not taking: Reported on 09/01/2019) 1 each 0  . diphenhydrAMINE (BENADRYL ALLERGY) 25 MG tablet Take 1 tablet (25 mg total) by mouth every 6 (six) hours as needed for itching. (Patient not taking: Reported on 09/01/2019) 100 tablet 0  . lidocaine-prilocaine (EMLA) cream Apply 1 application topically as needed. (Patient not taking: Reported on 09/01/2019) 30 g 2  . metoprolol tartrate (LOPRESSOR) 25 MG tablet Take 0.5 tablets (12.5 mg total) by mouth 2 (two) times daily. 30 tablet 3  . oxyCODONE-acetaminophen (PERCOCET) 5-325 MG tablet Take 2 tablets by mouth every 6 (six) hours as needed. (Patient not taking: Reported on 09/01/2019) 20 tablet 0  . prochlorperazine (COMPAZINE) 10 MG tablet Take 1 tablet (10 mg total) by mouth every 6 (six) hours as needed for nausea or vomiting. (Patient not taking: Reported on 09/01/2019) 30 tablet 2  . RELION GLUCOSE TEST STRIPS test strip USE 1 TO CHECK  GLUCOSE ONCE DAILY (Patient not taking: Reported on 09/01/2019)    . ReliOn Ultra Thin Lancets 30G MISC USE 1 TO CHECK GLUCOSE ONCE DAILY FOR GLUCOSE TESTING (Patient not taking: Reported on 09/01/2019)    . traMADol (ULTRAM) 50 MG tablet Take 50 mg by mouth every 8 (eight) hours as needed. (Patient not  taking: Reported on 09/01/2019)     No current facility-administered medications for this visit.    SURGICAL HISTORY:  Past Surgical History:  Procedure Laterality Date  . IR IMAGING GUIDED PORT INSERTION  05/10/2019    REVIEW OF SYSTEMS:   Review of Systems  Constitutional: Negative for appetite change, chills, fatigue, fever and unexpected weight change.  HENT: Negative for mouth sores, nosebleeds, sore throat and trouble swallowing.  Eyes: Negative for eye problems and icterus.  Respiratory: Negative for cough, hemoptysis, shortness of breath and wheezing.  Cardiovascular:Positive for baseline bilateral lower extremity swelling (improved from prior).Negative for chest pain. Gastrointestinal:Positive for abdominal bloating.Negative for abdominal pain, constipation, diarrhea, nausea and vomiting.  Genitourinary: Negative for bladder incontinence, difficulty urinating, dysuria, frequency and hematuria.  Musculoskeletal:Positive for persistent left hip discomfort. Positive for neck achyness.Negative for back painand gait change. Skin: Negative for itching and rash.  Neurological: Negative for dizziness, extremity weakness, gait problem, headaches, light-headedness and seizures.  Hematological: Negative for adenopathy. Does not bruise/bleed easily.  Psychiatric/Behavioral: Negative for confusion, depression and sleep disturbance. The patient is not nervous/anxious.    PHYSICAL EXAMINATION:  Blood pressure (!) 156/83, pulse 68, temperature (!) 97.2 F (36.2 C), temperature source Temporal, resp. rate 16, height '5\' 8"'  (1.727 m), weight 198 lb 14.4 oz (90.2 kg), SpO2 100 %.  ECOG PERFORMANCE STATUS: 1 - Symptomatic but completely ambulatory  Physical Exam  Constitutional: Oriented to person, place, and time and well-developed, well-nourished, and in no distress.  HENT:  Head: Normocephalic and atraumatic.  Mouth/Throat: Oropharynx is clear and moist. No oropharyngeal exudate.   Eyes: Conjunctivae are normal. Right eye exhibits no discharge. Left eye exhibits no discharge. No scleral icterus.  Neck: Normal range of motion. Neck supple.  Cardiovascular: Normal rate, regular rhythm, normal heart sounds and intact distal pulses.  Pulmonary/Chest: Effort normal and breath sounds normal. No respiratory distress. No wheezes. No rales.  Abdominal: Soft. Bowel sounds are normal. Exhibits no distension and no mass. There is no tenderness.  Musculoskeletal: Mild bilateral lower extremity swelling.Normal range of motion.  Lymphadenopathy:  No cervical adenopathy.  Neurological: Alert and oriented to person, place, and time. Exhibits normal muscle tone. Gait normal. Coordination normal.  Skin: Skin is warm. Legs/feet moist secondary to patient using Vaseline on her feet/legs. No rash noted. Not diaphoretic. No erythema. No pallor. Discolored and thickened toe nails.  Psychiatric: Mood, memory and judgment normal.  Vitals reviewed.  LABORATORY DATA: Lab Results  Component Value Date   WBC 3.0 (L) 08/31/2019   HGB 10.7 (L) 08/31/2019   HCT 32.0 (L) 08/31/2019   MCV 97.9 08/31/2019   PLT 133 (L) 08/31/2019      Chemistry      Component Value Date/Time   NA 140 08/31/2019 0808   K 4.2 08/31/2019 0808   CL 106 08/31/2019 0808   CO2 23 08/31/2019 0808   BUN 17 08/31/2019 0808   CREATININE 1.26 (H) 08/31/2019 0808      Component Value Date/Time   CALCIUM 8.8 (L) 08/31/2019 0808   ALKPHOS 59 08/31/2019 0808   AST 17 08/31/2019 0808   ALT 10 08/31/2019 0808   BILITOT  0.4 08/31/2019 0808       RADIOGRAPHIC STUDIES:  No results found.   ASSESSMENT/PLAN:  This is a very pleasant 69years old African female originally from [place] who is visiting her son in [place] and was diagnosed with IgA multiple myeloma.  The patient was a started initially on treatment with weekly subcutaneous Velcade 1.3 mg/M2, Revlimid 25 mg p.o. daily for 21 days  every 4 weeks as well as weekly Decadron 40 mg orally. She was tolerating the treatment well but she developed significant skin rash secondary to treatment with Revlimid and this was discontinued.  The patient continued treatment with subcutaneous Velcade and Decadron.She developed evidence of disease progression in February 2021. Her treatment was discontinued.  The patient was then started on Pomalyst 4 mg p.o. daily for 21 days on every 4 weeks with weekly Decadron 40 mg p.o. She is status post 6 days of treatment. She developed a significant skin rash/reaction to Pomalyst.This was discontinued.  The patient is currently undergoing treatment withCarfilzomibon days 1, 2, 8, 9, 15, and 16 and Cytoxan on days 1, 8, and 15 IV every 4 weeks with 40 mg p.o. weekly of Decadron.She is status post 4 cycles.  The patient was seen with Dr. Julien Nordmann today. Labs were reviewed. Recommend that she proceed with cycle #5 today as scheduled.   We will see her back for a follow up visit in 4 weeks for evaluation before starting cycle #6.   We will refer the patient to podiatry for her concerns regarding her toe nails.   The patient was advised to call immediately if she has any concerning symptoms in the interval. The patient voices understanding of current disease status and treatment options and is in agreement with the current care plan. All questions were answered. The patient knows to call the clinic with any problems, questions or concerns. We can certainly see the patient much sooner if necessary  Orders Placed This Encounter  Procedures  . Ambulatory referral to Podiatry    Referral Priority:   Routine    Referral Type:   Consultation    Referral Reason:   Specialty Services Required    Requested Specialty:   Podiatry    Number of Visits Requested:   [place], PA-C 09/01/19  ADDENDUM: Hematology/Oncology Attending: I had a face-to-face encounter with the  patient today.  I recommended her care plan.  This is a very pleasant 69 years old African female who was diagnosed with multiple myeloma in July 2019 status post several treatment in the past including subcutaneous Velcade, Revlimid as well as Decadron followed by Pomalyst which was discontinued secondary to hypersensitivity reaction. The patient is currently undergoing systemic chemotherapy with carfilzomib, Cytoxan and Decadron status post 4 cycles.  The patient has been tolerating this treatment well with no concerning adverse effects and she has improvement in her symptoms. I recommended for her to proceed with cycle #5 today as planned. She will come back for follow-up visit in 4 weeks for evaluation before starting cycle #6. The patient is interested in traveling back to [place] and I told her that we will be happy to arrange her treatment plan around her visit. She was advised to call immediately if she has any other concerning symptoms in the interval. Disclaimer: This note was dictated with voice recognition software. Similar sounding words can inadvertently be transcribed and may be missed upon review. Eilleen Kempf, MD 09/01/19

## 2019-08-31 NOTE — Patient Instructions (Signed)

## 2019-09-01 ENCOUNTER — Inpatient Hospital Stay: Payer: Self-pay

## 2019-09-01 ENCOUNTER — Encounter: Payer: Self-pay | Admitting: Physician Assistant

## 2019-09-01 ENCOUNTER — Other Ambulatory Visit: Payer: Self-pay

## 2019-09-01 ENCOUNTER — Inpatient Hospital Stay (HOSPITAL_BASED_OUTPATIENT_CLINIC_OR_DEPARTMENT_OTHER): Payer: Self-pay | Admitting: Physician Assistant

## 2019-09-01 VITALS — BP 156/83 | HR 68 | Temp 97.2°F | Resp 16 | Ht 68.0 in | Wt 198.9 lb

## 2019-09-01 DIAGNOSIS — C9000 Multiple myeloma not having achieved remission: Secondary | ICD-10-CM

## 2019-09-01 DIAGNOSIS — Z5111 Encounter for antineoplastic chemotherapy: Secondary | ICD-10-CM

## 2019-09-01 DIAGNOSIS — L608 Other nail disorders: Secondary | ICD-10-CM

## 2019-09-01 MED ORDER — PALONOSETRON HCL INJECTION 0.25 MG/5ML
INTRAVENOUS | Status: AC
  Filled 2019-09-01: qty 5

## 2019-09-01 MED ORDER — SODIUM CHLORIDE 0.9 % IV SOLN
Freq: Once | INTRAVENOUS | Status: AC
  Filled 2019-09-01: qty 250

## 2019-09-01 MED ORDER — SODIUM CHLORIDE 0.9% FLUSH
10.0000 mL | INTRAVENOUS | Status: DC | PRN
  Administered 2019-09-01: 10 mL
  Filled 2019-09-01: qty 10

## 2019-09-01 MED ORDER — SODIUM CHLORIDE 0.9 % IV SOLN
300.0000 mg/m2 | Freq: Once | INTRAVENOUS | Status: AC
  Administered 2019-09-01: 640 mg via INTRAVENOUS
  Filled 2019-09-01: qty 32

## 2019-09-01 MED ORDER — PALONOSETRON HCL INJECTION 0.25 MG/5ML
0.2500 mg | Freq: Once | INTRAVENOUS | Status: AC
  Administered 2019-09-01: 0.25 mg via INTRAVENOUS

## 2019-09-01 MED ORDER — DEXTROSE 5 % IV SOLN
36.0000 mg/m2 | Freq: Once | INTRAVENOUS | Status: AC
  Administered 2019-09-01: 80 mg via INTRAVENOUS
  Filled 2019-09-01: qty 30

## 2019-09-01 MED ORDER — HEPARIN SOD (PORK) LOCK FLUSH 100 UNIT/ML IV SOLN
500.0000 [IU] | Freq: Once | INTRAVENOUS | Status: AC | PRN
  Administered 2019-09-01: 500 [IU]
  Filled 2019-09-01: qty 5

## 2019-09-01 NOTE — Patient Instructions (Signed)
[facility] Discharge Instructions for Patients Receiving Chemotherapy  Today you received the following chemotherapy agents: carfilzomib and cyclophosphamide.  To help prevent nausea and vomiting after your treatment, we encourage you to take your nausea medication as directed.   If you develop nausea and vomiting that is not controlled by your nausea medication, call the clinic.   BELOW ARE SYMPTOMS THAT SHOULD BE REPORTED IMMEDIATELY:  *FEVER GREATER THAN 100.5 F  *CHILLS WITH OR WITHOUT FEVER  NAUSEA AND VOMITING THAT IS NOT CONTROLLED WITH YOUR NAUSEA MEDICATION  *UNUSUAL SHORTNESS OF BREATH  *UNUSUAL BRUISING OR BLEEDING  TENDERNESS IN MOUTH AND THROAT WITH OR WITHOUT PRESENCE OF ULCERS  *URINARY PROBLEMS  *BOWEL PROBLEMS  UNUSUAL RASH Items with * indicate a potential emergency and should be followed up as soon as possible.  Feel free to call the clinic should you have any questions or concerns. The clinic phone number is [phone].  Please show the CHEMO ALERT CARD at check-in to the Emergency Department and triage nurse.   

## 2019-09-02 ENCOUNTER — Other Ambulatory Visit: Payer: Self-pay

## 2019-09-02 ENCOUNTER — Other Ambulatory Visit: Payer: Self-pay | Admitting: Medical Oncology

## 2019-09-02 ENCOUNTER — Inpatient Hospital Stay: Payer: Self-pay

## 2019-09-02 VITALS — BP 154/79 | HR 69 | Temp 98.5°F | Resp 17

## 2019-09-02 DIAGNOSIS — C9000 Multiple myeloma not having achieved remission: Secondary | ICD-10-CM

## 2019-09-02 MED ORDER — DEXTROSE 5 % IV SOLN
36.0000 mg/m2 | Freq: Once | INTRAVENOUS | Status: AC
  Administered 2019-09-02: 80 mg via INTRAVENOUS
  Filled 2019-09-02: qty 30

## 2019-09-02 MED ORDER — SODIUM CHLORIDE 0.9 % IV SOLN
Freq: Once | INTRAVENOUS | Status: DC
  Filled 2019-09-02: qty 250

## 2019-09-02 MED ORDER — SODIUM CHLORIDE 0.9 % IV SOLN
Freq: Once | INTRAVENOUS | Status: AC
  Filled 2019-09-02: qty 250

## 2019-09-02 MED ORDER — SODIUM CHLORIDE 0.9 % IV SOLN
10.0000 mg | Freq: Once | INTRAVENOUS | Status: AC
  Administered 2019-09-02: 10 mg via INTRAVENOUS
  Filled 2019-09-02: qty 10

## 2019-09-02 MED ORDER — SODIUM CHLORIDE 0.9% FLUSH
10.0000 mL | INTRAVENOUS | Status: DC | PRN
  Administered 2019-09-02: 10 mL
  Filled 2019-09-02: qty 10

## 2019-09-02 MED ORDER — LIDOCAINE-PRILOCAINE 2.5-2.5 % EX CREA: 1.0000 "application " | TOPICAL_CREAM | CUTANEOUS | 2 refills | Status: DC | PRN

## 2019-09-02 MED ORDER — HEPARIN SOD (PORK) LOCK FLUSH 100 UNIT/ML IV SOLN
500.0000 [IU] | Freq: Once | INTRAVENOUS | Status: AC | PRN
  Administered 2019-09-02: 500 [IU]
  Filled 2019-09-02: qty 5

## 2019-09-02 MED ORDER — DEXAMETHASONE SODIUM PHOSPHATE 10 MG/ML IJ SOLN: 10.0000 mg | Freq: Once | INTRAMUSCULAR | Status: DC

## 2019-09-02 MED FILL — LIDOCAINE-PRILOCAINE 2.5-2.: 2.5-2.5 | 10 days supply | Qty: 30 | Fill #0

## 2019-09-02 NOTE — Patient Instructions (Signed)
[facility] Discharge Instructions for Patients Receiving Chemotherapy  Today you received the following chemotherapy agents:  Kyprolis  To help prevent nausea and vomiting after your treatment, we encourage you to take your nausea medication as directed.   If you develop nausea and vomiting that is not controlled by your nausea medication, call the clinic.   BELOW ARE SYMPTOMS THAT SHOULD BE REPORTED IMMEDIATELY:  *FEVER GREATER THAN 100.5 F  *CHILLS WITH OR WITHOUT FEVER  NAUSEA AND VOMITING THAT IS NOT CONTROLLED WITH YOUR NAUSEA MEDICATION  *UNUSUAL SHORTNESS OF BREATH  *UNUSUAL BRUISING OR BLEEDING  TENDERNESS IN MOUTH AND THROAT WITH OR WITHOUT PRESENCE OF ULCERS  *URINARY PROBLEMS  *BOWEL PROBLEMS  UNUSUAL RASH Items with * indicate a potential emergency and should be followed up as soon as possible.  Feel free to call the clinic should you have any questions or concerns. The clinic phone number is [phone].  Please show the CHEMO ALERT CARD at check-in to the Emergency Department and triage nurse.   

## 2019-09-08 ENCOUNTER — Other Ambulatory Visit: Payer: Self-pay

## 2019-09-08 ENCOUNTER — Inpatient Hospital Stay: Payer: Self-pay

## 2019-09-08 VITALS — Wt 195.5 lb

## 2019-09-08 DIAGNOSIS — Z5111 Encounter for antineoplastic chemotherapy: Secondary | ICD-10-CM

## 2019-09-08 DIAGNOSIS — C9000 Multiple myeloma not having achieved remission: Secondary | ICD-10-CM

## 2019-09-08 DIAGNOSIS — Z95828 Presence of other vascular implants and grafts: Secondary | ICD-10-CM

## 2019-09-08 LAB — LACTATE DEHYDROGENASE: LDH: 245 U/L — ABNORMAL HIGH (ref 98–192)

## 2019-09-08 LAB — CMP (CANCER CENTER ONLY)
ALT: 10 U/L (ref 0–44)
AST: 16 U/L (ref 15–41)
Albumin: 3.6 g/dL (ref 3.5–5.0)
Alkaline Phosphatase: 63 U/L (ref 38–126)
Anion gap: 9 (ref 5–15)
BUN: 15 mg/dL (ref 8–23)
CO2: 24 mmol/L (ref 22–32)
Calcium: 9 mg/dL (ref 8.9–10.3)
Chloride: 108 mmol/L (ref 98–111)
Creatinine: 1.29 mg/dL — ABNORMAL HIGH (ref 0.44–1.00)
GFR, Est AFR Am: 49 mL/min — ABNORMAL LOW (ref >60)
GFR, Estimated: 42 mL/min — ABNORMAL LOW (ref >60)
Glucose, Bld: 98 mg/dL (ref 70–99)
Potassium: 4.6 mmol/L (ref 3.5–5.1)
Sodium: 141 mmol/L (ref 135–145)
Total Bilirubin: 0.4 mg/dL (ref 0.3–1.2)
Total Protein: 6.7 g/dL (ref 6.5–8.1)

## 2019-09-08 LAB — CBC WITH DIFFERENTIAL (CANCER CENTER ONLY)
Abs Immature Granulocytes: 0.01 10*3/uL (ref 0.00–0.07)
Basophils Absolute: 0 10*3/uL (ref 0.0–0.1)
Basophils Relative: 1 %
Eosinophils Absolute: 0.2 10*3/uL (ref 0.0–0.5)
Eosinophils Relative: 6 %
HCT: 32.4 % — ABNORMAL LOW (ref 36.0–46.0)
Hemoglobin: 10.7 g/dL — ABNORMAL LOW (ref 12.0–15.0)
Immature Granulocytes: 0 %
Lymphocytes Relative: 11 %
Lymphs Abs: 0.5 10*3/uL — ABNORMAL LOW (ref 0.7–4.0)
MCH: 32.2 pg (ref 26.0–34.0)
MCHC: 33 g/dL (ref 30.0–36.0)
MCV: 97.6 fL (ref 80.0–100.0)
Monocytes Absolute: 0.1 10*3/uL (ref 0.1–1.0)
Monocytes Relative: 3 %
Neutro Abs: 3.3 10*3/uL (ref 1.7–7.7)
Neutrophils Relative %: 79 %
Platelet Count: 60 10*3/uL — ABNORMAL LOW (ref 150–400)
RBC: 3.32 MIL/uL — ABNORMAL LOW (ref 3.87–5.11)
RDW: 13.2 % (ref 11.5–15.5)
WBC Count: 4.2 10*3/uL (ref 4.0–10.5)
nRBC: 0 % (ref 0.0–0.2)

## 2019-09-08 MED ORDER — SODIUM CHLORIDE 0.9% FLUSH
10.0000 mL | INTRAVENOUS | Status: DC | PRN
  Administered 2019-09-08: 10 mL
  Filled 2019-09-08: qty 10

## 2019-09-08 MED ORDER — HEPARIN SOD (PORK) LOCK FLUSH 100 UNIT/ML IV SOLN
500.0000 [IU] | Freq: Once | INTRAVENOUS | Status: AC | PRN
  Administered 2019-09-08: 500 [IU]
  Filled 2019-09-08: qty 5

## 2019-09-08 MED FILL — DEXAMETHASONE 4 MG TABLET: 4 | 28 days supply | Qty: 40 | Fill #1

## 2019-09-08 MED FILL — ACYCLOVIR 200 MG CAP: 200 | 30 days supply | Qty: 60 | Fill #2

## 2019-09-08 MED FILL — OMEPRAZOLE 20 MG CAP: 20 | 30 days supply | Qty: 30 | Fill #2

## 2019-09-08 NOTE — Patient Instructions (Signed)
Thrombocytopenia Thrombocytopenia means that you have a low number of platelets in your blood. Platelets are tiny cells in the blood. When you bleed, they clump together at the cut or injury to stop the bleeding. This is called blood clotting. If you do not have enough platelets, it can cause bleeding problems. Some cases of this condition are mild while others are more severe. What are the causes? This condition may be caused by:  Your body not making enough platelets. This may be caused by: ? Your bone marrow not making blood cells (aplastic anemia). ? Cancer in the bone marrow. ? Certain medicines. ? Infection in the bone marrow. ? Drinking a lot of alcohol.  Your body destroying platelets too quickly. This may be caused by: ? Certain immune diseases. ? Certain medicines. ? Certain blood clotting disorders. ? Certain disorders that are passed from parent to child (inherited). ? Certain bleeding disorders. ? Pregnancy. ? Having a spleen that is larger than normal. What are the signs or symptoms?  Bleeding that is not normal.  Nosebleeds.  Heavy menstrual periods.  Blood in the pee (urine) or poop (stool).  A purple-like color to the skin (purpura).  Bruising.  A rash that looks like pinpoint, purple-red spots (petechiae). How is this treated?  Treatment of another condition that is causing the low platelet count.  Medicines to help protect your platelets from being destroyed.  A replacement (transfusion) of platelets to stop or prevent bleeding.  Surgery to remove the spleen. Follow these instructions at home: Activity  Avoid activities that could cause you to get hurt or bruised. Follow instructions about how to prevent falls.  Take care not to cut yourself: ? When you shave. ? When you use scissors, needles, knives, or other tools.  Take care not to burn yourself: ? When you use an iron. ? When you cook. General instructions   Check your skin and the  inside of your mouth for bruises or blood as told by your doctor.  Check to see if there is blood in your spit (sputum), pee, and poop. Do this as told by your doctor.  Do not drink alcohol.  Take over-the-counter and prescription medicines only as told by your doctor.  Do not take any medicines that have aspirin or NSAIDs in them. These medicines can thin your blood and cause you to bleed.  Tell all of your doctors that you have this condition. Be sure to tell your dentist and eye doctor too. Contact a doctor if:  You have bruises and you do not know why. Get help right away if:  You are bleeding anywhere on your body.  You have blood in your spit, pee, or poop. Summary  Thrombocytopenia means that you have a low number of platelets in your blood.  Platelets are needed for blood clotting.  Symptoms of this condition include bleeding that is not normal, and bruising.  Take care not to cut or burn yourself. This information is not intended to replace advice given to you by your health care provider. Make sure you discuss any questions you have with your health care provider. Document Revised: 11/12/2017 Document Reviewed: 11/12/2017 Elsevier Patient Education  2020 [employer]  

## 2019-09-08 NOTE — Progress Notes (Signed)
Per Cassie H, PA, treatment being held this week due to platelet count of 60. Patient informed of plan, and verbalizes understanding. She knows to comes back next week for treatment. RN called pharmacy to request re-fills per patient request. Port-a-cath de-accessed and patient sent home.

## 2019-09-09 ENCOUNTER — Inpatient Hospital Stay: Payer: Self-pay

## 2019-09-15 ENCOUNTER — Inpatient Hospital Stay: Payer: Self-pay

## 2019-09-15 ENCOUNTER — Other Ambulatory Visit: Payer: Self-pay

## 2019-09-15 VITALS — BP 145/60 | HR 65 | Temp 98.1°F | Resp 18 | Wt 198.0 lb

## 2019-09-15 DIAGNOSIS — Z5111 Encounter for antineoplastic chemotherapy: Secondary | ICD-10-CM

## 2019-09-15 DIAGNOSIS — C9000 Multiple myeloma not having achieved remission: Secondary | ICD-10-CM

## 2019-09-15 DIAGNOSIS — Z95828 Presence of other vascular implants and grafts: Secondary | ICD-10-CM

## 2019-09-15 LAB — CMP (CANCER CENTER ONLY)
ALT: 13 U/L (ref 0–44)
AST: 15 U/L (ref 15–41)
Albumin: 3.5 g/dL (ref 3.5–5.0)
Alkaline Phosphatase: 66 U/L (ref 38–126)
Anion gap: 12 (ref 5–15)
BUN: 15 mg/dL (ref 8–23)
CO2: 23 mmol/L (ref 22–32)
Calcium: 9.4 mg/dL (ref 8.9–10.3)
Chloride: 105 mmol/L (ref 98–111)
Creatinine: 1.22 mg/dL — ABNORMAL HIGH (ref 0.44–1.00)
GFR, Est AFR Am: 52 mL/min — ABNORMAL LOW (ref >60)
GFR, Estimated: 45 mL/min — ABNORMAL LOW (ref >60)
Glucose, Bld: 120 mg/dL — ABNORMAL HIGH (ref 70–99)
Potassium: 4.3 mmol/L (ref 3.5–5.1)
Sodium: 140 mmol/L (ref 135–145)
Total Bilirubin: 0.4 mg/dL (ref 0.3–1.2)
Total Protein: 6.5 g/dL (ref 6.5–8.1)

## 2019-09-15 LAB — CBC WITH DIFFERENTIAL (CANCER CENTER ONLY)
Abs Immature Granulocytes: 0.02 10*3/uL (ref 0.00–0.07)
Basophils Absolute: 0 10*3/uL (ref 0.0–0.1)
Basophils Relative: 1 %
Eosinophils Absolute: 0.4 10*3/uL (ref 0.0–0.5)
Eosinophils Relative: 9 %
HCT: 32.9 % — ABNORMAL LOW (ref 36.0–46.0)
Hemoglobin: 10.9 g/dL — ABNORMAL LOW (ref 12.0–15.0)
Immature Granulocytes: 1 %
Lymphocytes Relative: 15 %
Lymphs Abs: 0.7 10*3/uL (ref 0.7–4.0)
MCH: 32.2 pg (ref 26.0–34.0)
MCHC: 33.1 g/dL (ref 30.0–36.0)
MCV: 97.3 fL (ref 80.0–100.0)
Monocytes Absolute: 0.2 10*3/uL (ref 0.1–1.0)
Monocytes Relative: 4 %
Neutro Abs: 3.1 10*3/uL (ref 1.7–7.7)
Neutrophils Relative %: 70 %
Platelet Count: 124 10*3/uL — ABNORMAL LOW (ref 150–400)
RBC: 3.38 MIL/uL — ABNORMAL LOW (ref 3.87–5.11)
RDW: 12.9 % (ref 11.5–15.5)
WBC Count: 4.4 10*3/uL (ref 4.0–10.5)
nRBC: 0 % (ref 0.0–0.2)

## 2019-09-15 LAB — LACTATE DEHYDROGENASE: LDH: 232 U/L — ABNORMAL HIGH (ref 98–192)

## 2019-09-15 MED ORDER — SODIUM CHLORIDE 0.9 % IV SOLN
Freq: Once | INTRAVENOUS | Status: AC
  Filled 2019-09-15: qty 250

## 2019-09-15 MED ORDER — SODIUM CHLORIDE 0.9% FLUSH
10.0000 mL | INTRAVENOUS | Status: DC | PRN
  Administered 2019-09-15: 10 mL
  Filled 2019-09-15: qty 10

## 2019-09-15 MED ORDER — SODIUM CHLORIDE 0.9 % IV SOLN
300.0000 mg/m2 | Freq: Once | INTRAVENOUS | Status: AC
  Administered 2019-09-15: 640 mg via INTRAVENOUS
  Filled 2019-09-15: qty 32

## 2019-09-15 MED ORDER — HEPARIN SOD (PORK) LOCK FLUSH 100 UNIT/ML IV SOLN
500.0000 [IU] | Freq: Once | INTRAVENOUS | Status: AC | PRN
  Administered 2019-09-15: 500 [IU]
  Filled 2019-09-15: qty 5

## 2019-09-15 MED ORDER — PALONOSETRON HCL INJECTION 0.25 MG/5ML
0.2500 mg | Freq: Once | INTRAVENOUS | Status: AC
  Administered 2019-09-15: 0.25 mg via INTRAVENOUS

## 2019-09-15 MED ORDER — DEXTROSE 5 % IV SOLN
36.0000 mg/m2 | Freq: Once | INTRAVENOUS | Status: AC
  Administered 2019-09-15: 80 mg via INTRAVENOUS
  Filled 2019-09-15: qty 10

## 2019-09-15 MED ORDER — SODIUM CHLORIDE 0.9 % IV SOLN
Freq: Once | INTRAVENOUS | Status: DC
  Filled 2019-09-15: qty 250

## 2019-09-15 MED ORDER — PALONOSETRON HCL INJECTION 0.25 MG/5ML
INTRAVENOUS | Status: AC
  Filled 2019-09-15: qty 5

## 2019-09-15 NOTE — Patient Instructions (Signed)
[facility] Discharge Instructions for Patients Receiving Chemotherapy  Today you received the following chemotherapy agents: carfilzomib and cyclophosphamide.  To help prevent nausea and vomiting after your treatment, we encourage you to take your nausea medication as directed.   If you develop nausea and vomiting that is not controlled by your nausea medication, call the clinic.   BELOW ARE SYMPTOMS THAT SHOULD BE REPORTED IMMEDIATELY:  *FEVER GREATER THAN 100.5 F  *CHILLS WITH OR WITHOUT FEVER  NAUSEA AND VOMITING THAT IS NOT CONTROLLED WITH YOUR NAUSEA MEDICATION  *UNUSUAL SHORTNESS OF BREATH  *UNUSUAL BRUISING OR BLEEDING  TENDERNESS IN MOUTH AND THROAT WITH OR WITHOUT PRESENCE OF ULCERS  *URINARY PROBLEMS  *BOWEL PROBLEMS  UNUSUAL RASH Items with * indicate a potential emergency and should be followed up as soon as possible.  Feel free to call the clinic should you have any questions or concerns. The clinic phone number is [phone].  Please show the CHEMO ALERT CARD at check-in to the Emergency Department and triage nurse.   

## 2019-09-15 NOTE — Progress Notes (Signed)
Pt asked to stay accessed for tomorrows infusion. Bio patch and take home dressing applied

## 2019-09-16 ENCOUNTER — Inpatient Hospital Stay: Payer: Self-pay

## 2019-09-16 ENCOUNTER — Telehealth: Payer: Self-pay | Admitting: Internal Medicine

## 2019-09-16 ENCOUNTER — Other Ambulatory Visit: Payer: Self-pay

## 2019-09-16 VITALS — BP 159/84 | HR 70 | Temp 99.6°F | Resp 18

## 2019-09-16 DIAGNOSIS — C9000 Multiple myeloma not having achieved remission: Secondary | ICD-10-CM

## 2019-09-16 MED ORDER — DEXAMETHASONE SODIUM PHOSPHATE 10 MG/ML IJ SOLN: 10.0000 mg | Freq: Once | INTRAMUSCULAR | Status: DC

## 2019-09-16 MED ORDER — HEPARIN SOD (PORK) LOCK FLUSH 100 UNIT/ML IV SOLN
500.0000 [IU] | Freq: Once | INTRAVENOUS | Status: AC | PRN
  Administered 2019-09-16: 500 [IU]
  Filled 2019-09-16: qty 5

## 2019-09-16 MED ORDER — SODIUM CHLORIDE 0.9% FLUSH
10.0000 mL | INTRAVENOUS | Status: DC | PRN
  Administered 2019-09-16: 10 mL
  Filled 2019-09-16: qty 10

## 2019-09-16 MED ORDER — SODIUM CHLORIDE 0.9 % IV SOLN
Freq: Once | INTRAVENOUS | Status: AC
  Filled 2019-09-16: qty 250

## 2019-09-16 MED ORDER — DEXTROSE 5 % IV SOLN
36.0000 mg/m2 | Freq: Once | INTRAVENOUS | Status: AC
  Administered 2019-09-16: 80 mg via INTRAVENOUS
  Filled 2019-09-16: qty 30

## 2019-09-16 MED ORDER — SODIUM CHLORIDE 0.9 % IV SOLN
10.0000 mg | Freq: Once | INTRAVENOUS | Status: AC
  Administered 2019-09-16: 10 mg via INTRAVENOUS
  Filled 2019-09-16: qty 10

## 2019-09-16 NOTE — Telephone Encounter (Signed)
R/s appt per 7/23 sch msg- called son , no answer. Left message with appt date and time

## 2019-09-16 NOTE — Patient Instructions (Signed)
[facility] Discharge Instructions for Patients Receiving Chemotherapy  Today you received the following chemotherapy agents: carfilzomib.  To help prevent nausea and vomiting after your treatment, we encourage you to take your nausea medication as directed.   If you develop nausea and vomiting that is not controlled by your nausea medication, call the clinic.   BELOW ARE SYMPTOMS THAT SHOULD BE REPORTED IMMEDIATELY:  *FEVER GREATER THAN 100.5 F  *CHILLS WITH OR WITHOUT FEVER  NAUSEA AND VOMITING THAT IS NOT CONTROLLED WITH YOUR NAUSEA MEDICATION  *UNUSUAL SHORTNESS OF BREATH  *UNUSUAL BRUISING OR BLEEDING  TENDERNESS IN MOUTH AND THROAT WITH OR WITHOUT PRESENCE OF ULCERS  *URINARY PROBLEMS  *BOWEL PROBLEMS  UNUSUAL RASH Items with * indicate a potential emergency and should be followed up as soon as possible.  Feel free to call the clinic should you have any questions or concerns. The clinic phone number is [phone].  Please show the CHEMO ALERT CARD at check-in to the Emergency Department and triage nurse.   

## 2019-09-16 NOTE — Addendum Note (Signed)
Addended by: Eddie Candle C on: 09/16/2019 12:08 PM   Modules accepted: Orders

## 2019-09-19 ENCOUNTER — Telehealth: Payer: Self-pay | Admitting: *Deleted

## 2019-09-19 NOTE — Telephone Encounter (Signed)
Called pt son to make aware of upcoming appts on Wednesday, Thursday, and Friday. Advised pt should be here 15 min prior to start of appt's. Pt son verbalized understanding.

## 2019-09-21 ENCOUNTER — Inpatient Hospital Stay: Payer: Self-pay

## 2019-09-21 ENCOUNTER — Telehealth: Payer: Self-pay

## 2019-09-21 ENCOUNTER — Other Ambulatory Visit: Payer: Self-pay

## 2019-09-21 DIAGNOSIS — Z5111 Encounter for antineoplastic chemotherapy: Secondary | ICD-10-CM

## 2019-09-21 DIAGNOSIS — Z95828 Presence of other vascular implants and grafts: Secondary | ICD-10-CM

## 2019-09-21 DIAGNOSIS — C9000 Multiple myeloma not having achieved remission: Secondary | ICD-10-CM

## 2019-09-21 LAB — CMP (CANCER CENTER ONLY)
ALT: 12 U/L (ref 0–44)
AST: 17 U/L (ref 15–41)
Albumin: 3.6 g/dL (ref 3.5–5.0)
Alkaline Phosphatase: 63 U/L (ref 38–126)
Anion gap: 9 (ref 5–15)
BUN: 16 mg/dL (ref 8–23)
CO2: 26 mmol/L (ref 22–32)
Calcium: 9.7 mg/dL (ref 8.9–10.3)
Chloride: 107 mmol/L (ref 98–111)
Creatinine: 1.21 mg/dL — ABNORMAL HIGH (ref 0.44–1.00)
GFR, Est AFR Am: 53 mL/min — ABNORMAL LOW (ref >60)
GFR, Estimated: 46 mL/min — ABNORMAL LOW (ref >60)
Glucose, Bld: 87 mg/dL (ref 70–99)
Potassium: 4.2 mmol/L (ref 3.5–5.1)
Sodium: 142 mmol/L (ref 135–145)
Total Bilirubin: 0.5 mg/dL (ref 0.3–1.2)
Total Protein: 6.3 g/dL — ABNORMAL LOW (ref 6.5–8.1)

## 2019-09-21 LAB — CBC WITH DIFFERENTIAL (CANCER CENTER ONLY)
Abs Immature Granulocytes: 0.01 10*3/uL (ref 0.00–0.07)
Basophils Absolute: 0 10*3/uL (ref 0.0–0.1)
Basophils Relative: 1 %
Eosinophils Absolute: 0.2 10*3/uL (ref 0.0–0.5)
Eosinophils Relative: 7 %
HCT: 31.2 % — ABNORMAL LOW (ref 36.0–46.0)
Hemoglobin: 10.5 g/dL — ABNORMAL LOW (ref 12.0–15.0)
Immature Granulocytes: 0 %
Lymphocytes Relative: 19 %
Lymphs Abs: 0.6 10*3/uL — ABNORMAL LOW (ref 0.7–4.0)
MCH: 33.3 pg (ref 26.0–34.0)
MCHC: 33.7 g/dL (ref 30.0–36.0)
MCV: 99 fL (ref 80.0–100.0)
Monocytes Absolute: 0.4 10*3/uL (ref 0.1–1.0)
Monocytes Relative: 12 %
Neutro Abs: 2 10*3/uL (ref 1.7–7.7)
Neutrophils Relative %: 61 %
Platelet Count: 47 10*3/uL — ABNORMAL LOW (ref 150–400)
RBC: 3.15 MIL/uL — ABNORMAL LOW (ref 3.87–5.11)
RDW: 12.8 % (ref 11.5–15.5)
WBC Count: 3.3 10*3/uL — ABNORMAL LOW (ref 4.0–10.5)
nRBC: 0.6 % — ABNORMAL HIGH (ref 0.0–0.2)

## 2019-09-21 LAB — LACTATE DEHYDROGENASE: LDH: 229 U/L — ABNORMAL HIGH (ref 98–192)

## 2019-09-21 MED ORDER — SODIUM CHLORIDE 0.9% FLUSH
10.0000 mL | INTRAVENOUS | Status: DC | PRN
  Administered 2019-09-21: 10 mL
  Filled 2019-09-21: qty 10

## 2019-09-21 MED ORDER — HEPARIN SOD (PORK) LOCK FLUSH 100 UNIT/ML IV SOLN
500.0000 [IU] | Freq: Once | INTRAVENOUS | Status: AC | PRN
  Administered 2019-09-21: 500 [IU]
  Filled 2019-09-21: qty 5

## 2019-09-21 NOTE — Patient Instructions (Signed)

## 2019-09-21 NOTE — Telephone Encounter (Signed)
Per Cassie PA called pt but was unable to reach her. Left voicemail for patients daughter/son to call back [facility] in regard to pt. Will try call again later.

## 2019-09-21 NOTE — Telephone Encounter (Signed)
Spoke with patients daughter Keelynn Furgerson and  Let her know that her mother's platelet count was too low to receive treatment this week so she does not need to come in this week at all. I let her know that next week is her mothers off week so she will not need to come in for treatment next week either. Her next appointment is scheduled for 8/12/2. Daughter verbalized understanding  LAB @ 7:45a FLUSH @8 :00a MOHAMED @ 8:15a INFUSION @ 8:45a  Daughter verbalized understanding of all appointment times.

## 2019-09-22 ENCOUNTER — Inpatient Hospital Stay: Payer: Self-pay

## 2019-09-23 ENCOUNTER — Inpatient Hospital Stay: Payer: Self-pay

## 2019-09-29 ENCOUNTER — Other Ambulatory Visit: Payer: Self-pay

## 2019-09-29 ENCOUNTER — Ambulatory Visit: Payer: Self-pay

## 2019-09-29 ENCOUNTER — Ambulatory Visit: Payer: Self-pay | Admitting: Internal Medicine

## 2019-09-30 ENCOUNTER — Ambulatory Visit: Payer: Self-pay

## 2019-10-06 ENCOUNTER — Other Ambulatory Visit: Payer: Self-pay | Admitting: Internal Medicine

## 2019-10-06 ENCOUNTER — Encounter: Payer: Self-pay | Admitting: Internal Medicine

## 2019-10-06 ENCOUNTER — Inpatient Hospital Stay: Payer: Self-pay | Attending: Internal Medicine

## 2019-10-06 ENCOUNTER — Inpatient Hospital Stay: Payer: Self-pay

## 2019-10-06 ENCOUNTER — Other Ambulatory Visit: Payer: Self-pay

## 2019-10-06 ENCOUNTER — Inpatient Hospital Stay (HOSPITAL_BASED_OUTPATIENT_CLINIC_OR_DEPARTMENT_OTHER): Payer: Self-pay | Admitting: Internal Medicine

## 2019-10-06 VITALS — BP 145/63 | HR 71 | Temp 97.1°F | Resp 20 | Ht 68.0 in | Wt 196.9 lb

## 2019-10-06 DIAGNOSIS — C9000 Multiple myeloma not having achieved remission: Secondary | ICD-10-CM

## 2019-10-06 DIAGNOSIS — Z5111 Encounter for antineoplastic chemotherapy: Secondary | ICD-10-CM

## 2019-10-06 DIAGNOSIS — Z95828 Presence of other vascular implants and grafts: Secondary | ICD-10-CM

## 2019-10-06 DIAGNOSIS — Z79899 Other long term (current) drug therapy: Secondary | ICD-10-CM | POA: Insufficient documentation

## 2019-10-06 LAB — CBC WITH DIFFERENTIAL (CANCER CENTER ONLY)
Abs Immature Granulocytes: 0.01 10*3/uL (ref 0.00–0.07)
Basophils Absolute: 0 10*3/uL (ref 0.0–0.1)
Basophils Relative: 1 %
Eosinophils Absolute: 0.2 10*3/uL (ref 0.0–0.5)
Eosinophils Relative: 5 %
HCT: 33.2 % — ABNORMAL LOW (ref 36.0–46.0)
Hemoglobin: 11.1 g/dL — ABNORMAL LOW (ref 12.0–15.0)
Immature Granulocytes: 0 %
Lymphocytes Relative: 18 %
Lymphs Abs: 0.8 10*3/uL (ref 0.7–4.0)
MCH: 32.6 pg (ref 26.0–34.0)
MCHC: 33.4 g/dL (ref 30.0–36.0)
MCV: 97.6 fL (ref 80.0–100.0)
Monocytes Absolute: 0.2 10*3/uL (ref 0.1–1.0)
Monocytes Relative: 4 %
Neutro Abs: 3.1 10*3/uL (ref 1.7–7.7)
Neutrophils Relative %: 72 %
Platelet Count: 117 10*3/uL — ABNORMAL LOW (ref 150–400)
RBC: 3.4 MIL/uL — ABNORMAL LOW (ref 3.87–5.11)
RDW: 12.4 % (ref 11.5–15.5)
WBC Count: 4.3 10*3/uL (ref 4.0–10.5)
nRBC: 0 % (ref 0.0–0.2)

## 2019-10-06 LAB — CMP (CANCER CENTER ONLY)
ALT: 9 U/L (ref 0–44)
AST: 14 U/L — ABNORMAL LOW (ref 15–41)
Albumin: 3.6 g/dL (ref 3.5–5.0)
Alkaline Phosphatase: 66 U/L (ref 38–126)
Anion gap: 11 (ref 5–15)
BUN: 17 mg/dL (ref 8–23)
CO2: 22 mmol/L (ref 22–32)
Calcium: 9.5 mg/dL (ref 8.9–10.3)
Chloride: 107 mmol/L (ref 98–111)
Creatinine: 1.55 mg/dL — ABNORMAL HIGH (ref 0.44–1.00)
GFR, Est AFR Am: 39 mL/min — ABNORMAL LOW (ref >60)
GFR, Estimated: 34 mL/min — ABNORMAL LOW (ref >60)
Glucose, Bld: 104 mg/dL — ABNORMAL HIGH (ref 70–99)
Potassium: 4.6 mmol/L (ref 3.5–5.1)
Sodium: 140 mmol/L (ref 135–145)
Total Bilirubin: 0.4 mg/dL (ref 0.3–1.2)
Total Protein: 6.5 g/dL (ref 6.5–8.1)

## 2019-10-06 LAB — LACTATE DEHYDROGENASE: LDH: 242 U/L — ABNORMAL HIGH (ref 98–192)

## 2019-10-06 MED ORDER — DEXTROSE 5 % IV SOLN
36.0000 mg/m2 | Freq: Once | INTRAVENOUS | Status: AC
  Administered 2019-10-06: 80 mg via INTRAVENOUS
  Filled 2019-10-06: qty 30

## 2019-10-06 MED ORDER — HEPARIN SOD (PORK) LOCK FLUSH 100 UNIT/ML IV SOLN
500.0000 [IU] | Freq: Once | INTRAVENOUS | Status: AC | PRN
  Administered 2019-10-06: 500 [IU]
  Filled 2019-10-06: qty 5

## 2019-10-06 MED ORDER — PALONOSETRON HCL INJECTION 0.25 MG/5ML
INTRAVENOUS | Status: AC
  Filled 2019-10-06: qty 5

## 2019-10-06 MED ORDER — SODIUM CHLORIDE 0.9 % IV SOLN
300.0000 mg/m2 | Freq: Once | INTRAVENOUS | Status: AC
  Administered 2019-10-06: 640 mg via INTRAVENOUS
  Filled 2019-10-06: qty 32

## 2019-10-06 MED ORDER — PALONOSETRON HCL INJECTION 0.25 MG/5ML
0.2500 mg | Freq: Once | INTRAVENOUS | Status: AC
  Administered 2019-10-06: 0.25 mg via INTRAVENOUS

## 2019-10-06 MED ORDER — SODIUM CHLORIDE 0.9 % IV SOLN
Freq: Once | INTRAVENOUS | Status: AC
  Filled 2019-10-06: qty 250

## 2019-10-06 MED ORDER — SODIUM CHLORIDE 0.9% FLUSH
10.0000 mL | INTRAVENOUS | Status: DC | PRN
  Administered 2019-10-06: 10 mL
  Filled 2019-10-06: qty 10

## 2019-10-06 MED FILL — ACYCLOVIR 200 MG CAP: 200 | 30 days supply | Qty: 60 | Fill #0

## 2019-10-06 MED FILL — OMEPRAZOLE 20 MG CAP: 20 | 30 days supply | Qty: 30 | Fill #3

## 2019-10-06 MED FILL — METOPROLOL TARTRATE 25 MG T: 25 | 30 days supply | Qty: 30 | Fill #3

## 2019-10-06 NOTE — Patient Instructions (Signed)
[facility] Discharge Instructions for Patients Receiving Chemotherapy  Today you received the following chemotherapy agents: carfilzomib and cyclophosphamide.  To help prevent nausea and vomiting after your treatment, we encourage you to take your nausea medication as directed.   If you develop nausea and vomiting that is not controlled by your nausea medication, call the clinic.   BELOW ARE SYMPTOMS THAT SHOULD BE REPORTED IMMEDIATELY:  *FEVER GREATER THAN 100.5 F  *CHILLS WITH OR WITHOUT FEVER  NAUSEA AND VOMITING THAT IS NOT CONTROLLED WITH YOUR NAUSEA MEDICATION  *UNUSUAL SHORTNESS OF BREATH  *UNUSUAL BRUISING OR BLEEDING  TENDERNESS IN MOUTH AND THROAT WITH OR WITHOUT PRESENCE OF ULCERS  *URINARY PROBLEMS  *BOWEL PROBLEMS  UNUSUAL RASH Items with * indicate a potential emergency and should be followed up as soon as possible.  Feel free to call the clinic should you have any questions or concerns. The clinic phone number is [phone].  Please show the CHEMO ALERT CARD at check-in to the Emergency Department and triage nurse.   

## 2019-10-06 NOTE — Progress Notes (Signed)
Per Dr Julien Nordmann ,it is okay to treat pt with Cytoxan, carfilzomib and creatinine of 1.55.

## 2019-10-06 NOTE — Progress Notes (Signed)
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  PRIOR THERAPY: 1)Systemic therapy with Velcade 1.3 mg/M2 weekly, Revlimid 25 mg p.o. daily for 14 days every 3 weeks in addition to weekly Decadron 40 mg orally. First dose of treatment 10/08/2017.Treatment was placed on hold due to development of a significant rash.Sheresumedtreatment withonlydexamethasone and Velcade on 11/19/2017.This was discontinued on 04/14/2019 due to evidence of disease progression. 2)Pomalyst 4 mg p.o. for 21 days every 4 weeks and 40 mg p.o. Decadron once a week. First dose3/06/2019-05/04/2019.Status post 6 days of treatment. Discontinued secondary to allergy to Pomalyst.  CURRENT THERAPY: Chemotherapy with carfilzomibon days 1, 2, 8, 9, 15, and 16and Cytoxan 300 mg/m2 on days 1,8, and 15 IV every 4 weeks and 40 mg p.o. weekly of Decadron. First dose expected on 05/12/2019. Status post 5 cycles.  INTERVAL HISTORY: Debbie Chan 69 y.o. female returns to the clinic today for follow-up visit.  The patient is feeling fine except for fatigue and pain in the right neck area and down to the legs.  She is currently on Percocet and Ultram for pain management.  She is requesting refill of omeprazole and acyclovir.  She denied having any current chest pain, shortness of breath, cough or hemoptysis.  She denied having any fever or chills.  She has no nausea, vomiting, diarrhea or constipation.  She has no headache or visual changes.  She continues to tolerate her systemic chemotherapy fairly well.  The patient is here today for evaluation before starting cycle #6 of her treatment.  MEDICAL HISTORY: Past Medical History:  Diagnosis Date  . Cancer ([place])     ALLERGIES:  has No Known Allergies.  MEDICATIONS:  Current Outpatient Medications   Medication Sig Dispense Refill  . acyclovir (ZOVIRAX) 200 MG capsule Take 1 capsule (200 mg total) by mouth 2 (two) times daily. 60 capsule 2  . blood glucose meter kit and supplies Dispense based on patient and insurance preference. Use up to four times daily as directed. (FOR ICD-10 E10.9, E11.9). (Patient not taking: Reported on 09/01/2019) 1 each 0  . dexamethasone (DECADRON) 4 MG tablet Take 10 tablets (40 mg total) by mouth every 7 (seven) days. 40 tablet 3  . diphenhydrAMINE (BENADRYL ALLERGY) 25 MG tablet Take 1 tablet (25 mg total) by mouth every 6 (six) hours as needed for itching. (Patient not taking: Reported on 09/01/2019) 100 tablet 0  . lidocaine-prilocaine (EMLA) cream Apply 1 application topically as needed. 30 g 2  . metoprolol tartrate (LOPRESSOR) 25 MG tablet Take 0.5 tablets (12.5 mg total) by mouth 2 (two) times daily. 30 tablet 3  . omeprazole (PRILOSEC) 20 MG capsule Take 1 capsule (20 mg total) by mouth daily. 30 capsule 3  . oxyCODONE-acetaminophen (PERCOCET) 5-325 MG tablet Take 2 tablets by mouth every 6 (six) hours as needed. (Patient not taking: Reported on 09/01/2019) 20 tablet 0  . prochlorperazine (COMPAZINE) 10 MG tablet Take 1 tablet (10 mg total) by mouth every 6 (six) hours as needed for nausea or vomiting. (Patient not taking: Reported on 09/01/2019) 30 tablet 2  . RELION GLUCOSE TEST STRIPS test strip USE 1 TO CHECK GLUCOSE ONCE DAILY (Patient not taking: Reported on 09/01/2019)    . ReliOn Ultra Thin Lancets 30G MISC USE 1 TO CHECK GLUCOSE ONCE DAILY FOR GLUCOSE TESTING (Patient not  taking: Reported on 09/01/2019)    . traMADol (ULTRAM) 50 MG tablet Take 50 mg by mouth every 8 (eight) hours as needed. (Patient not taking: Reported on 09/01/2019)     No current facility-administered medications for this visit.    SURGICAL HISTORY:  Past Surgical History:  Procedure Laterality Date  . IR IMAGING GUIDED PORT INSERTION  05/10/2019    REVIEW OF SYSTEMS:  A comprehensive  review of systems was negative except for: Constitutional: positive for fatigue Musculoskeletal: positive for arthralgias and neck pain Neurological: positive for paresthesia   PHYSICAL EXAMINATION: General appearance: alert, cooperative, fatigued and no distress Head: Normocephalic, without obvious abnormality, atraumatic Neck: no adenopathy, no JVD, supple, symmetrical, trachea midline and thyroid not enlarged, symmetric, no tenderness/mass/nodules Lymph nodes: Cervical, supraclavicular, and axillary nodes normal. Resp: clear to auscultation bilaterally Back: symmetric, no curvature. ROM normal. No CVA tenderness. Cardio: regular rate and rhythm, S1, S2 normal, no murmur, click, rub or gallop GI: soft, non-tender; bowel sounds normal; no masses,  no organomegaly Extremities: extremities normal, atraumatic, no cyanosis or edema  ECOG PERFORMANCE STATUS: 1 - Symptomatic but completely ambulatory  Blood pressure (!) 145/63, pulse 71, temperature (!) 97.1 F (36.2 C), temperature source Tympanic, resp. rate 20, height '5\' 8"'$  (1.727 m), weight 196 lb 14.4 oz (89.3 kg), SpO2 100 %.  LABORATORY DATA: Lab Results  Component Value Date   WBC 4.3 10/06/2019   HGB 11.1 (L) 10/06/2019   HCT 33.2 (L) 10/06/2019   MCV 97.6 10/06/2019   PLT 117 (L) 10/06/2019      Chemistry      Component Value Date/Time   NA 142 09/21/2019 1020   K 4.2 09/21/2019 1020   CL 107 09/21/2019 1020   CO2 26 09/21/2019 1020   BUN 16 09/21/2019 1020   CREATININE 1.21 (H) 09/21/2019 1020      Component Value Date/Time   CALCIUM 9.7 09/21/2019 1020   ALKPHOS 63 09/21/2019 1020   AST 17 09/21/2019 1020   ALT 12 09/21/2019 1020   BILITOT 0.5 09/21/2019 1020       RADIOGRAPHIC STUDIES: No results found.  ASSESSMENT AND PLAN: This is a very pleasant 69 years old African female originally from [place] who is visiting her son in [place] and was recently diagnosed with IgA multiple  myeloma. The patient was a started initially on treatment with weekly subcutaneous Velcade 1.3 mg/M2, Revlimid 25 mg p.o. daily for 21 days every 4 weeks as well as weekly Decadron 40 mg orally.  Revlimid was discontinued secondary to hypersensitivity reaction with significant skin rash.  She is currently on treatment with weekly Velcade and Decadron. Her treatment was discontinued secondary to disease progression.  The patient started treatment with Pomalyst and Decadron but this was discontinued secondary to hypersensitivity reaction to the Pomalyst. She is currently undergoing systemic chemotherapy with carfilzomib, Cytoxan and Decadron status post 5 cycles. The patient continues to tolerate her treatment well with no concerning adverse effects. I recommended for her to proceed with cycle #6 today as planned. She will come back for follow-up visit in 4 weeks for evaluation after repeating myeloma panel for restaging of her disease. I will give the patient a refill of acyclovir and omeprazole. Regarding her travel back to [place], I asked the patient to give me a timeframe for her travel and we will adjust her treatment around her plan. The patient was advised to call immediately if she has any concerning symptoms in the interval. The  patient voices understanding of current disease status and treatment options and is in agreement with the current care plan. All questions were answered. The patient knows to call the clinic with any problems, questions or concerns. We can certainly see the patient much sooner if necessary.  Disclaimer: This note was dictated with voice recognition software. Similar sounding words can inadvertently be transcribed and may not be corrected upon review.

## 2019-10-06 NOTE — Patient Instructions (Signed)

## 2019-10-07 ENCOUNTER — Other Ambulatory Visit: Payer: Self-pay

## 2019-10-07 ENCOUNTER — Inpatient Hospital Stay: Payer: Self-pay

## 2019-10-07 VITALS — BP 165/83 | HR 72 | Temp 98.1°F | Ht 68.0 in | Wt 201.0 lb

## 2019-10-07 DIAGNOSIS — C9000 Multiple myeloma not having achieved remission: Secondary | ICD-10-CM

## 2019-10-07 MED ORDER — PROCHLORPERAZINE MALEATE 10 MG PO TABS
ORAL_TABLET | ORAL | Status: AC
  Filled 2019-10-07: qty 1

## 2019-10-07 MED ORDER — PROCHLORPERAZINE MALEATE 10 MG PO TABS
10.0000 mg | ORAL_TABLET | Freq: Once | ORAL | Status: AC
  Administered 2019-10-07: 10 mg via ORAL

## 2019-10-07 MED ORDER — SODIUM CHLORIDE 0.9 % IV SOLN
Freq: Once | INTRAVENOUS | Status: AC
  Filled 2019-10-07: qty 250

## 2019-10-07 MED ORDER — HEPARIN SOD (PORK) LOCK FLUSH 100 UNIT/ML IV SOLN
500.0000 [IU] | Freq: Once | INTRAVENOUS | Status: AC | PRN
  Administered 2019-10-07: 500 [IU]
  Filled 2019-10-07: qty 5

## 2019-10-07 MED ORDER — DEXTROSE 5 % IV SOLN
36.0000 mg/m2 | Freq: Once | INTRAVENOUS | Status: AC
  Administered 2019-10-07: 80 mg via INTRAVENOUS
  Filled 2019-10-07: qty 30

## 2019-10-07 MED ORDER — SODIUM CHLORIDE 0.9% FLUSH
10.0000 mL | INTRAVENOUS | Status: DC | PRN
  Administered 2019-10-07: 10 mL
  Filled 2019-10-07: qty 10

## 2019-10-07 NOTE — Patient Instructions (Signed)
[facility] Discharge Instructions for Patients Receiving Chemotherapy  Today you received the following chemotherapy agents: carfilzomib.  To help prevent nausea and vomiting after your treatment, we encourage you to take your nausea medication as directed.   If you develop nausea and vomiting that is not controlled by your nausea medication, call the clinic.   BELOW ARE SYMPTOMS THAT SHOULD BE REPORTED IMMEDIATELY:  *FEVER GREATER THAN 100.5 F  *CHILLS WITH OR WITHOUT FEVER  NAUSEA AND VOMITING THAT IS NOT CONTROLLED WITH YOUR NAUSEA MEDICATION  *UNUSUAL SHORTNESS OF BREATH  *UNUSUAL BRUISING OR BLEEDING  TENDERNESS IN MOUTH AND THROAT WITH OR WITHOUT PRESENCE OF ULCERS  *URINARY PROBLEMS  *BOWEL PROBLEMS  UNUSUAL RASH Items with * indicate a potential emergency and should be followed up as soon as possible.  Feel free to call the clinic should you have any questions or concerns. The clinic phone number is [phone].  Please show the CHEMO ALERT CARD at check-in to the Emergency Department and triage nurse.   

## 2019-10-10 ENCOUNTER — Other Ambulatory Visit: Payer: Self-pay | Admitting: Medical Oncology

## 2019-10-10 ENCOUNTER — Telehealth: Payer: Self-pay | Admitting: Medical Oncology

## 2019-10-10 DIAGNOSIS — C9000 Multiple myeloma not having achieved remission: Secondary | ICD-10-CM

## 2019-10-10 DIAGNOSIS — K219 Gastro-esophageal reflux disease without esophagitis: Secondary | ICD-10-CM

## 2019-10-10 MED ORDER — ACYCLOVIR 200 MG PO CAPS: 200.0000 mg | ORAL_CAPSULE | Freq: Two times a day (BID) | ORAL | 4 refills | Status: DC

## 2019-10-10 MED ORDER — OMEPRAZOLE 20 MG PO CPDR: 20.0000 mg | DELAYED_RELEASE_CAPSULE | Freq: Every day | ORAL | 4 refills | Status: DC

## 2019-10-10 NOTE — Telephone Encounter (Signed)
Son said she got a refill for Metoprolol from her PCP.

## 2019-10-13 ENCOUNTER — Other Ambulatory Visit: Payer: Self-pay

## 2019-10-13 ENCOUNTER — Inpatient Hospital Stay: Payer: Self-pay

## 2019-10-13 VITALS — BP 161/77 | HR 60 | Temp 98.4°F | Resp 18 | Wt 198.8 lb

## 2019-10-13 DIAGNOSIS — C9000 Multiple myeloma not having achieved remission: Secondary | ICD-10-CM

## 2019-10-13 DIAGNOSIS — Z5111 Encounter for antineoplastic chemotherapy: Secondary | ICD-10-CM

## 2019-10-13 DIAGNOSIS — Z95828 Presence of other vascular implants and grafts: Secondary | ICD-10-CM

## 2019-10-13 LAB — CBC WITH DIFFERENTIAL (CANCER CENTER ONLY)
Abs Immature Granulocytes: 0.02 10*3/uL (ref 0.00–0.07)
Basophils Absolute: 0 10*3/uL (ref 0.0–0.1)
Basophils Relative: 0 %
Eosinophils Absolute: 0.2 10*3/uL (ref 0.0–0.5)
Eosinophils Relative: 4 %
HCT: 32 % — ABNORMAL LOW (ref 36.0–46.0)
Hemoglobin: 10.8 g/dL — ABNORMAL LOW (ref 12.0–15.0)
Immature Granulocytes: 0 %
Lymphocytes Relative: 10 %
Lymphs Abs: 0.5 10*3/uL — ABNORMAL LOW (ref 0.7–4.0)
MCH: 32.7 pg (ref 26.0–34.0)
MCHC: 33.8 g/dL (ref 30.0–36.0)
MCV: 97 fL (ref 80.0–100.0)
Monocytes Absolute: 0.2 10*3/uL (ref 0.1–1.0)
Monocytes Relative: 4 %
Neutro Abs: 4 10*3/uL (ref 1.7–7.7)
Neutrophils Relative %: 82 %
Platelet Count: 59 10*3/uL — ABNORMAL LOW (ref 150–400)
RBC: 3.3 MIL/uL — ABNORMAL LOW (ref 3.87–5.11)
RDW: 12.6 % (ref 11.5–15.5)
WBC Count: 4.9 10*3/uL (ref 4.0–10.5)
nRBC: 0 % (ref 0.0–0.2)

## 2019-10-13 LAB — CMP (CANCER CENTER ONLY)
ALT: 12 U/L (ref 0–44)
AST: 16 U/L (ref 15–41)
Albumin: 3.7 g/dL (ref 3.5–5.0)
Alkaline Phosphatase: 75 U/L (ref 38–126)
Anion gap: 7 (ref 5–15)
BUN: 15 mg/dL (ref 8–23)
CO2: 25 mmol/L (ref 22–32)
Calcium: 9.2 mg/dL (ref 8.9–10.3)
Chloride: 108 mmol/L (ref 98–111)
Creatinine: 1.13 mg/dL — ABNORMAL HIGH (ref 0.44–1.00)
GFR, Est AFR Am: 57 mL/min — ABNORMAL LOW (ref >60)
GFR, Estimated: 50 mL/min — ABNORMAL LOW (ref >60)
Glucose, Bld: 97 mg/dL (ref 70–99)
Potassium: 4.4 mmol/L (ref 3.5–5.1)
Sodium: 140 mmol/L (ref 135–145)
Total Bilirubin: 0.4 mg/dL (ref 0.3–1.2)
Total Protein: 6.5 g/dL (ref 6.5–8.1)

## 2019-10-13 LAB — LACTATE DEHYDROGENASE: LDH: 247 U/L — ABNORMAL HIGH (ref 98–192)

## 2019-10-13 MED ORDER — SODIUM CHLORIDE 0.9% FLUSH
10.0000 mL | INTRAVENOUS | Status: DC | PRN
  Administered 2019-10-13: 10 mL
  Filled 2019-10-13: qty 10

## 2019-10-13 MED ORDER — SODIUM CHLORIDE 0.9 % IV SOLN
Freq: Once | INTRAVENOUS | Status: AC
  Filled 2019-10-13: qty 250

## 2019-10-13 MED ORDER — PALONOSETRON HCL INJECTION 0.25 MG/5ML
0.2500 mg | Freq: Once | INTRAVENOUS | Status: AC
  Administered 2019-10-13: 0.25 mg via INTRAVENOUS

## 2019-10-13 MED ORDER — HEPARIN SOD (PORK) LOCK FLUSH 100 UNIT/ML IV SOLN
500.0000 [IU] | Freq: Once | INTRAVENOUS | Status: AC | PRN
  Administered 2019-10-13: 500 [IU]
  Filled 2019-10-13: qty 5

## 2019-10-13 MED ORDER — SODIUM CHLORIDE 0.9 % IV SOLN
300.0000 mg/m2 | Freq: Once | INTRAVENOUS | Status: AC
  Administered 2019-10-13: 640 mg via INTRAVENOUS
  Filled 2019-10-13: qty 32

## 2019-10-13 MED ORDER — PALONOSETRON HCL INJECTION 0.25 MG/5ML
INTRAVENOUS | Status: AC
  Filled 2019-10-13: qty 5

## 2019-10-13 MED ORDER — DEXTROSE 5 % IV SOLN
36.0000 mg/m2 | Freq: Once | INTRAVENOUS | Status: AC
  Administered 2019-10-13: 80 mg via INTRAVENOUS
  Filled 2019-10-13: qty 30

## 2019-10-13 NOTE — Progress Notes (Signed)
Confirmed with RN that patient did take her weekly dose of dexamethasone at home prior to infusion today.   Eddie Candle, PharmD PGY-2 Hematology/Oncology Pharmacy Resident

## 2019-10-13 NOTE — Progress Notes (Signed)
Platelets today are 46, ok to treat per Dr. Julien Nordmann.

## 2019-10-13 NOTE — Patient Instructions (Signed)
[facility] Discharge Instructions for Patients Receiving Chemotherapy  Today you received the following chemotherapy agents: carfilzomib and cyclophosphamide.  To help prevent nausea and vomiting after your treatment, we encourage you to take your nausea medication as directed.   If you develop nausea and vomiting that is not controlled by your nausea medication, call the clinic.   BELOW ARE SYMPTOMS THAT SHOULD BE REPORTED IMMEDIATELY:  *FEVER GREATER THAN 100.5 F  *CHILLS WITH OR WITHOUT FEVER  NAUSEA AND VOMITING THAT IS NOT CONTROLLED WITH YOUR NAUSEA MEDICATION  *UNUSUAL SHORTNESS OF BREATH  *UNUSUAL BRUISING OR BLEEDING  TENDERNESS IN MOUTH AND THROAT WITH OR WITHOUT PRESENCE OF ULCERS  *URINARY PROBLEMS  *BOWEL PROBLEMS  UNUSUAL RASH Items with * indicate a potential emergency and should be followed up as soon as possible.  Feel free to call the clinic should you have any questions or concerns. The clinic phone number is [phone].  Please show the CHEMO ALERT CARD at check-in to the Emergency Department and triage nurse.   

## 2019-10-14 ENCOUNTER — Inpatient Hospital Stay: Payer: Self-pay

## 2019-10-14 ENCOUNTER — Other Ambulatory Visit: Payer: Self-pay

## 2019-10-14 VITALS — BP 161/84 | HR 69 | Temp 98.3°F | Resp 18

## 2019-10-14 DIAGNOSIS — C9000 Multiple myeloma not having achieved remission: Secondary | ICD-10-CM

## 2019-10-14 MED ORDER — HEPARIN SOD (PORK) LOCK FLUSH 100 UNIT/ML IV SOLN
500.0000 [IU] | Freq: Once | INTRAVENOUS | Status: AC | PRN
  Administered 2019-10-14: 500 [IU]
  Filled 2019-10-14: qty 5

## 2019-10-14 MED ORDER — SODIUM CHLORIDE 0.9 % IV SOLN
Freq: Once | INTRAVENOUS | Status: AC
  Filled 2019-10-14: qty 250

## 2019-10-14 MED ORDER — PROCHLORPERAZINE MALEATE 10 MG PO TABS
ORAL_TABLET | ORAL | Status: AC
  Filled 2019-10-14: qty 1

## 2019-10-14 MED ORDER — PROCHLORPERAZINE MALEATE 10 MG PO TABS
10.0000 mg | ORAL_TABLET | Freq: Once | ORAL | Status: AC
  Administered 2019-10-14: 10 mg via ORAL

## 2019-10-14 MED ORDER — SODIUM CHLORIDE 0.9% FLUSH
10.0000 mL | INTRAVENOUS | Status: DC | PRN
  Administered 2019-10-14: 10 mL
  Filled 2019-10-14: qty 10

## 2019-10-14 MED ORDER — DEXTROSE 5 % IV SOLN
36.0000 mg/m2 | Freq: Once | INTRAVENOUS | Status: AC
  Administered 2019-10-14: 80 mg via INTRAVENOUS
  Filled 2019-10-14: qty 30

## 2019-10-14 NOTE — Patient Instructions (Signed)
[facility] Discharge Instructions for Patients Receiving Chemotherapy  Today you received the following chemotherapy agent: carfilzomib (Kyprolis)  To help prevent nausea and vomiting after your treatment, we encourage you to take your nausea medication as directed.   If you develop nausea and vomiting that is not controlled by your nausea medication, call the clinic.   BELOW ARE SYMPTOMS THAT SHOULD BE REPORTED IMMEDIATELY:  *FEVER GREATER THAN 100.5 F  *CHILLS WITH OR WITHOUT FEVER  NAUSEA AND VOMITING THAT IS NOT CONTROLLED WITH YOUR NAUSEA MEDICATION  *UNUSUAL SHORTNESS OF BREATH  *UNUSUAL BRUISING OR BLEEDING  TENDERNESS IN MOUTH AND THROAT WITH OR WITHOUT PRESENCE OF ULCERS  *URINARY PROBLEMS  *BOWEL PROBLEMS  UNUSUAL RASH Items with * indicate a potential emergency and should be followed up as soon as possible.  Feel free to call the clinic should you have any questions or concerns. The clinic phone number is [phone].  Please show the [place] at check-in to the Emergency Department and triage nurse.

## 2019-10-20 ENCOUNTER — Inpatient Hospital Stay: Payer: Self-pay

## 2019-10-20 ENCOUNTER — Other Ambulatory Visit: Payer: Self-pay | Admitting: Physician Assistant

## 2019-10-20 ENCOUNTER — Other Ambulatory Visit: Payer: Self-pay

## 2019-10-20 VITALS — BP 155/80 | HR 58 | Temp 98.5°F | Resp 19

## 2019-10-20 DIAGNOSIS — C9000 Multiple myeloma not having achieved remission: Secondary | ICD-10-CM

## 2019-10-20 DIAGNOSIS — Z5111 Encounter for antineoplastic chemotherapy: Secondary | ICD-10-CM

## 2019-10-20 DIAGNOSIS — T7840XA Allergy, unspecified, initial encounter: Secondary | ICD-10-CM

## 2019-10-20 LAB — CBC WITH DIFFERENTIAL (CANCER CENTER ONLY)
Abs Immature Granulocytes: 0.02 10*3/uL (ref 0.00–0.07)
Basophils Absolute: 0 10*3/uL (ref 0.0–0.1)
Basophils Relative: 1 %
Eosinophils Absolute: 0.1 10*3/uL (ref 0.0–0.5)
Eosinophils Relative: 3 %
HCT: 32.2 % — ABNORMAL LOW (ref 36.0–46.0)
Hemoglobin: 10.8 g/dL — ABNORMAL LOW (ref 12.0–15.0)
Immature Granulocytes: 1 %
Lymphocytes Relative: 12 %
Lymphs Abs: 0.4 10*3/uL — ABNORMAL LOW (ref 0.7–4.0)
MCH: 32.7 pg (ref 26.0–34.0)
MCHC: 33.5 g/dL (ref 30.0–36.0)
MCV: 97.6 fL (ref 80.0–100.0)
Monocytes Absolute: 0.2 10*3/uL (ref 0.1–1.0)
Monocytes Relative: 6 %
Neutro Abs: 2.4 10*3/uL (ref 1.7–7.7)
Neutrophils Relative %: 77 %
Platelet Count: 77 10*3/uL — ABNORMAL LOW (ref 150–400)
RBC: 3.3 MIL/uL — ABNORMAL LOW (ref 3.87–5.11)
RDW: 13.3 % (ref 11.5–15.5)
WBC Count: 3.1 10*3/uL — ABNORMAL LOW (ref 4.0–10.5)
nRBC: 1 % — ABNORMAL HIGH (ref 0.0–0.2)

## 2019-10-20 LAB — CMP (CANCER CENTER ONLY)
ALT: 19 U/L (ref 0–44)
AST: 17 U/L (ref 15–41)
Albumin: 3.6 g/dL (ref 3.5–5.0)
Alkaline Phosphatase: 81 U/L (ref 38–126)
Anion gap: 7 (ref 5–15)
BUN: 18 mg/dL (ref 8–23)
CO2: 25 mmol/L (ref 22–32)
Calcium: 9.5 mg/dL (ref 8.9–10.3)
Chloride: 108 mmol/L (ref 98–111)
Creatinine: 1.35 mg/dL — ABNORMAL HIGH (ref 0.44–1.00)
GFR, Est AFR Am: 46 mL/min — ABNORMAL LOW (ref >60)
GFR, Estimated: 40 mL/min — ABNORMAL LOW (ref >60)
Glucose, Bld: 97 mg/dL (ref 70–99)
Potassium: 4.6 mmol/L (ref 3.5–5.1)
Sodium: 140 mmol/L (ref 135–145)
Total Bilirubin: 0.4 mg/dL (ref 0.3–1.2)
Total Protein: 6.4 g/dL — ABNORMAL LOW (ref 6.5–8.1)

## 2019-10-20 LAB — LACTATE DEHYDROGENASE: LDH: 250 U/L — ABNORMAL HIGH (ref 98–192)

## 2019-10-20 MED ORDER — PALONOSETRON HCL INJECTION 0.25 MG/5ML
INTRAVENOUS | Status: AC
  Filled 2019-10-20: qty 5

## 2019-10-20 MED ORDER — SODIUM CHLORIDE 0.9 % IV SOLN
Freq: Once | INTRAVENOUS | Status: AC
  Filled 2019-10-20: qty 250

## 2019-10-20 MED ORDER — DEXTROSE 5 % IV SOLN
36.0000 mg/m2 | Freq: Once | INTRAVENOUS | Status: AC
  Administered 2019-10-20: 80 mg via INTRAVENOUS
  Filled 2019-10-20: qty 30

## 2019-10-20 MED ORDER — HEPARIN SOD (PORK) LOCK FLUSH 100 UNIT/ML IV SOLN
500.0000 [IU] | Freq: Once | INTRAVENOUS | Status: AC | PRN
  Administered 2019-10-20: 500 [IU]
  Filled 2019-10-20: qty 5

## 2019-10-20 MED ORDER — DEXAMETHASONE 4 MG PO TABS: 40.0000 mg | ORAL_TABLET | ORAL | 3 refills | Status: DC

## 2019-10-20 MED ORDER — SODIUM CHLORIDE 0.9 % IV SOLN
300.0000 mg/m2 | Freq: Once | INTRAVENOUS | Status: AC
  Administered 2019-10-20: 640 mg via INTRAVENOUS
  Filled 2019-10-20: qty 32

## 2019-10-20 MED ORDER — SODIUM CHLORIDE 0.9% FLUSH
10.0000 mL | INTRAVENOUS | Status: DC | PRN
  Administered 2019-10-20: 10 mL
  Filled 2019-10-20: qty 10

## 2019-10-20 MED ORDER — PALONOSETRON HCL INJECTION 0.25 MG/5ML
0.2500 mg | Freq: Once | INTRAVENOUS | Status: AC
  Administered 2019-10-20: 0.25 mg via INTRAVENOUS

## 2019-10-20 MED FILL — DEXAMETHASONE 4 MG TABLET: 4 | 28 days supply | Qty: 40 | Fill #0

## 2019-10-20 NOTE — Progress Notes (Signed)
Per Cassie, PA, okay to treat with platelets 77 today.

## 2019-10-20 NOTE — Patient Instructions (Signed)
[facility] Discharge Instructions for Patients Receiving Chemotherapy  Today you received the following chemotherapy agents: carfilzomib and cyclophosphamide.  To help prevent nausea and vomiting after your treatment, we encourage you to take your nausea medication as directed.   If you develop nausea and vomiting that is not controlled by your nausea medication, call the clinic.   BELOW ARE SYMPTOMS THAT SHOULD BE REPORTED IMMEDIATELY:  *FEVER GREATER THAN 100.5 F  *CHILLS WITH OR WITHOUT FEVER  NAUSEA AND VOMITING THAT IS NOT CONTROLLED WITH YOUR NAUSEA MEDICATION  *UNUSUAL SHORTNESS OF BREATH  *UNUSUAL BRUISING OR BLEEDING  TENDERNESS IN MOUTH AND THROAT WITH OR WITHOUT PRESENCE OF ULCERS  *URINARY PROBLEMS  *BOWEL PROBLEMS  UNUSUAL RASH Items with * indicate a potential emergency and should be followed up as soon as possible.  Feel free to call the clinic should you have any questions or concerns. The clinic phone number is [phone].  Please show the CHEMO ALERT CARD at check-in to the Emergency Department and triage nurse.   

## 2019-10-21 ENCOUNTER — Other Ambulatory Visit: Payer: Self-pay

## 2019-10-21 ENCOUNTER — Inpatient Hospital Stay: Payer: Self-pay

## 2019-10-21 VITALS — BP 159/82 | HR 67 | Temp 98.1°F | Resp 18

## 2019-10-21 DIAGNOSIS — C9000 Multiple myeloma not having achieved remission: Secondary | ICD-10-CM

## 2019-10-21 MED ORDER — SODIUM CHLORIDE 0.9% FLUSH
10.0000 mL | INTRAVENOUS | Status: DC | PRN
  Administered 2019-10-21: 10 mL
  Filled 2019-10-21: qty 10

## 2019-10-21 MED ORDER — PROCHLORPERAZINE MALEATE 10 MG PO TABS
ORAL_TABLET | ORAL | Status: AC
  Filled 2019-10-21: qty 1

## 2019-10-21 MED ORDER — SODIUM CHLORIDE 0.9 % IV SOLN
Freq: Once | INTRAVENOUS | Status: AC
  Filled 2019-10-21: qty 250

## 2019-10-21 MED ORDER — DEXTROSE 5 % IV SOLN
36.0000 mg/m2 | Freq: Once | INTRAVENOUS | Status: AC
  Administered 2019-10-21: 80 mg via INTRAVENOUS
  Filled 2019-10-21: qty 30

## 2019-10-21 MED ORDER — HEPARIN SOD (PORK) LOCK FLUSH 100 UNIT/ML IV SOLN
500.0000 [IU] | Freq: Once | INTRAVENOUS | Status: AC | PRN
  Administered 2019-10-21: 500 [IU]
  Filled 2019-10-21: qty 5

## 2019-10-21 MED ORDER — PROCHLORPERAZINE MALEATE 10 MG PO TABS
10.0000 mg | ORAL_TABLET | Freq: Once | ORAL | Status: AC
  Administered 2019-10-21: 10 mg via ORAL

## 2019-10-21 NOTE — Patient Instructions (Signed)
[facility] Discharge Instructions for Patients Receiving Chemotherapy  Today you received the following chemotherapy agents:  Kyprolis  To help prevent nausea and vomiting after your treatment, we encourage you to take your nausea medication as directed.   If you develop nausea and vomiting that is not controlled by your nausea medication, call the clinic.   BELOW ARE SYMPTOMS THAT SHOULD BE REPORTED IMMEDIATELY:  *FEVER GREATER THAN 100.5 F  *CHILLS WITH OR WITHOUT FEVER  NAUSEA AND VOMITING THAT IS NOT CONTROLLED WITH YOUR NAUSEA MEDICATION  *UNUSUAL SHORTNESS OF BREATH  *UNUSUAL BRUISING OR BLEEDING  TENDERNESS IN MOUTH AND THROAT WITH OR WITHOUT PRESENCE OF ULCERS  *URINARY PROBLEMS  *BOWEL PROBLEMS  UNUSUAL RASH Items with * indicate a potential emergency and should be followed up as soon as possible.  Feel free to call the clinic should you have any questions or concerns. The clinic phone number is [phone].  Please show the CHEMO ALERT CARD at check-in to the Emergency Department and triage nurse.   

## 2019-10-28 ENCOUNTER — Other Ambulatory Visit: Payer: Self-pay | Admitting: Internal Medicine

## 2019-10-28 ENCOUNTER — Telehealth: Payer: Self-pay | Admitting: Physician Assistant

## 2019-10-28 ENCOUNTER — Inpatient Hospital Stay: Payer: Self-pay

## 2019-10-28 ENCOUNTER — Other Ambulatory Visit: Payer: Self-pay

## 2019-10-28 ENCOUNTER — Inpatient Hospital Stay: Payer: Self-pay | Attending: Internal Medicine

## 2019-10-28 DIAGNOSIS — C9000 Multiple myeloma not having achieved remission: Secondary | ICD-10-CM | POA: Insufficient documentation

## 2019-10-28 DIAGNOSIS — Z95828 Presence of other vascular implants and grafts: Secondary | ICD-10-CM

## 2019-10-28 DIAGNOSIS — D696 Thrombocytopenia, unspecified: Secondary | ICD-10-CM | POA: Insufficient documentation

## 2019-10-28 DIAGNOSIS — R05 Cough: Secondary | ICD-10-CM | POA: Insufficient documentation

## 2019-10-28 DIAGNOSIS — Z79899 Other long term (current) drug therapy: Secondary | ICD-10-CM | POA: Insufficient documentation

## 2019-10-28 DIAGNOSIS — R5383 Other fatigue: Secondary | ICD-10-CM | POA: Insufficient documentation

## 2019-10-28 DIAGNOSIS — R197 Diarrhea, unspecified: Secondary | ICD-10-CM | POA: Insufficient documentation

## 2019-10-28 DIAGNOSIS — Z5111 Encounter for antineoplastic chemotherapy: Secondary | ICD-10-CM | POA: Insufficient documentation

## 2019-10-28 LAB — CBC WITH DIFFERENTIAL (CANCER CENTER ONLY)
Abs Immature Granulocytes: 0.01 10*3/uL (ref 0.00–0.07)
Basophils Absolute: 0 10*3/uL (ref 0.0–0.1)
Basophils Relative: 1 %
Eosinophils Absolute: 0 10*3/uL (ref 0.0–0.5)
Eosinophils Relative: 1 %
HCT: 32.8 % — ABNORMAL LOW (ref 36.0–46.0)
Hemoglobin: 10.8 g/dL — ABNORMAL LOW (ref 12.0–15.0)
Immature Granulocytes: 0 %
Lymphocytes Relative: 9 %
Lymphs Abs: 0.3 10*3/uL — ABNORMAL LOW (ref 0.7–4.0)
MCH: 32.3 pg (ref 26.0–34.0)
MCHC: 32.9 g/dL (ref 30.0–36.0)
MCV: 98.2 fL (ref 80.0–100.0)
Monocytes Absolute: 0.1 10*3/uL (ref 0.1–1.0)
Monocytes Relative: 4 %
Neutro Abs: 2.8 10*3/uL (ref 1.7–7.7)
Neutrophils Relative %: 85 %
Platelet Count: 82 10*3/uL — ABNORMAL LOW (ref 150–400)
RBC: 3.34 MIL/uL — ABNORMAL LOW (ref 3.87–5.11)
RDW: 13.7 % (ref 11.5–15.5)
WBC Count: 3.3 10*3/uL — ABNORMAL LOW (ref 4.0–10.5)
nRBC: 0 % (ref 0.0–0.2)

## 2019-10-28 LAB — CMP (CANCER CENTER ONLY)
ALT: 9 U/L (ref 0–44)
AST: 11 U/L — ABNORMAL LOW (ref 15–41)
Albumin: 3.7 g/dL (ref 3.5–5.0)
Alkaline Phosphatase: 70 U/L (ref 38–126)
Anion gap: 8 (ref 5–15)
BUN: 16 mg/dL (ref 8–23)
CO2: 25 mmol/L (ref 22–32)
Calcium: 9 mg/dL (ref 8.9–10.3)
Chloride: 108 mmol/L (ref 98–111)
Creatinine: 1.32 mg/dL — ABNORMAL HIGH (ref 0.44–1.00)
GFR, Est AFR Am: 48 mL/min — ABNORMAL LOW (ref >60)
GFR, Estimated: 41 mL/min — ABNORMAL LOW (ref >60)
Glucose, Bld: 105 mg/dL — ABNORMAL HIGH (ref 70–99)
Potassium: 4.5 mmol/L (ref 3.5–5.1)
Sodium: 141 mmol/L (ref 135–145)
Total Bilirubin: 0.5 mg/dL (ref 0.3–1.2)
Total Protein: 6.5 g/dL (ref 6.5–8.1)

## 2019-10-28 LAB — LACTATE DEHYDROGENASE: LDH: 256 U/L — ABNORMAL HIGH (ref 98–192)

## 2019-10-28 MED ORDER — HEPARIN SOD (PORK) LOCK FLUSH 100 UNIT/ML IV SOLN
500.0000 [IU] | Freq: Once | INTRAVENOUS | Status: AC | PRN
  Administered 2019-10-28: 500 [IU]
  Filled 2019-10-28: qty 5

## 2019-10-28 MED ORDER — SODIUM CHLORIDE 0.9% FLUSH
10.0000 mL | INTRAVENOUS | Status: DC | PRN
  Administered 2019-10-28: 10 mL
  Filled 2019-10-28: qty 10

## 2019-10-28 MED FILL — METOPROLOL TARTRATE 25 MG T: 25 | 30 days supply | Qty: 30 | Fill #0

## 2019-10-28 NOTE — Telephone Encounter (Signed)
Scheduled upcoming appointments, spoke with patient's son. Patient will be notified.

## 2019-10-28 NOTE — Patient Instructions (Signed)

## 2019-10-29 LAB — IGG, IGA, IGM
IgA: 1104 mg/dL — ABNORMAL HIGH (ref 87–352)
IgG (Immunoglobin G), Serum: 177 mg/dL — ABNORMAL LOW (ref 586–1602)
IgM (Immunoglobulin M), Srm: <5 mg/dL — ABNORMAL LOW (ref 26–217)

## 2019-11-01 LAB — KAPPA/LAMBDA LIGHT CHAINS
Kappa free light chain: 2.6 mg/L — ABNORMAL LOW (ref 3.3–19.4)
Kappa, lambda light chain ratio: 0 — ABNORMAL LOW (ref 0.26–1.65)
Lambda free light chains: 593.7 mg/L — ABNORMAL HIGH (ref 5.7–26.3)

## 2019-11-01 LAB — BETA 2 MICROGLOBULIN, SERUM: Beta-2 Microglobulin: 2.7 mg/L — ABNORMAL HIGH (ref 0.6–2.4)

## 2019-11-03 ENCOUNTER — Other Ambulatory Visit: Payer: Self-pay | Admitting: Physician Assistant

## 2019-11-03 DIAGNOSIS — C9000 Multiple myeloma not having achieved remission: Secondary | ICD-10-CM

## 2019-11-04 ENCOUNTER — Inpatient Hospital Stay: Payer: Self-pay

## 2019-11-04 ENCOUNTER — Other Ambulatory Visit: Payer: Self-pay | Admitting: Internal Medicine

## 2019-11-04 ENCOUNTER — Other Ambulatory Visit: Payer: Self-pay

## 2019-11-04 VITALS — BP 152/76 | HR 69 | Temp 98.1°F | Resp 20 | Wt 196.8 lb

## 2019-11-04 DIAGNOSIS — Z95828 Presence of other vascular implants and grafts: Secondary | ICD-10-CM

## 2019-11-04 DIAGNOSIS — C9000 Multiple myeloma not having achieved remission: Secondary | ICD-10-CM

## 2019-11-04 LAB — CMP (CANCER CENTER ONLY)
ALT: 8 U/L (ref 0–44)
AST: 14 U/L — ABNORMAL LOW (ref 15–41)
Albumin: 3.8 g/dL (ref 3.5–5.0)
Alkaline Phosphatase: 70 U/L (ref 38–126)
Anion gap: 11 (ref 5–15)
BUN: 22 mg/dL (ref 8–23)
CO2: 21 mmol/L — ABNORMAL LOW (ref 22–32)
Calcium: 9.3 mg/dL (ref 8.9–10.3)
Chloride: 104 mmol/L (ref 98–111)
Creatinine: 1.25 mg/dL — ABNORMAL HIGH (ref 0.44–1.00)
GFR, Est AFR Am: 51 mL/min — ABNORMAL LOW (ref >60)
GFR, Estimated: 44 mL/min — ABNORMAL LOW (ref >60)
Glucose, Bld: 123 mg/dL — ABNORMAL HIGH (ref 70–99)
Potassium: 4.1 mmol/L (ref 3.5–5.1)
Sodium: 136 mmol/L (ref 135–145)
Total Bilirubin: 0.5 mg/dL (ref 0.3–1.2)
Total Protein: 7 g/dL (ref 6.5–8.1)

## 2019-11-04 LAB — CBC WITH DIFFERENTIAL (CANCER CENTER ONLY)
Abs Immature Granulocytes: 0.02 10*3/uL (ref 0.00–0.07)
Basophils Absolute: 0 10*3/uL (ref 0.0–0.1)
Basophils Relative: 0 %
Eosinophils Absolute: 0 10*3/uL (ref 0.0–0.5)
Eosinophils Relative: 0 %
HCT: 33.4 % — ABNORMAL LOW (ref 36.0–46.0)
Hemoglobin: 11.3 g/dL — ABNORMAL LOW (ref 12.0–15.0)
Immature Granulocytes: 0 %
Lymphocytes Relative: 15 %
Lymphs Abs: 0.7 10*3/uL (ref 0.7–4.0)
MCH: 32.5 pg (ref 26.0–34.0)
MCHC: 33.8 g/dL (ref 30.0–36.0)
MCV: 96 fL (ref 80.0–100.0)
Monocytes Absolute: 0.5 10*3/uL (ref 0.1–1.0)
Monocytes Relative: 11 %
Neutro Abs: 3.3 10*3/uL (ref 1.7–7.7)
Neutrophils Relative %: 74 %
Platelet Count: 166 10*3/uL (ref 150–400)
RBC: 3.48 MIL/uL — ABNORMAL LOW (ref 3.87–5.11)
RDW: 13.2 % (ref 11.5–15.5)
WBC Count: 4.6 10*3/uL (ref 4.0–10.5)
nRBC: 0 % (ref 0.0–0.2)

## 2019-11-04 MED ORDER — PALONOSETRON HCL INJECTION 0.25 MG/5ML
INTRAVENOUS | Status: AC
  Filled 2019-11-04: qty 5

## 2019-11-04 MED ORDER — SODIUM CHLORIDE 0.9 % IV SOLN
Freq: Once | INTRAVENOUS | Status: AC
  Filled 2019-11-04: qty 250

## 2019-11-04 MED ORDER — SODIUM CHLORIDE 0.9 % IV SOLN
300.0000 mg/m2 | Freq: Once | INTRAVENOUS | Status: AC
  Administered 2019-11-04: 640 mg via INTRAVENOUS
  Filled 2019-11-04: qty 32

## 2019-11-04 MED ORDER — HEPARIN SOD (PORK) LOCK FLUSH 100 UNIT/ML IV SOLN
500.0000 [IU] | Freq: Once | INTRAVENOUS | Status: AC | PRN
  Administered 2019-11-04: 500 [IU]
  Filled 2019-11-04: qty 5

## 2019-11-04 MED ORDER — PALONOSETRON HCL INJECTION 0.25 MG/5ML
0.2500 mg | Freq: Once | INTRAVENOUS | Status: AC
  Administered 2019-11-04: 0.25 mg via INTRAVENOUS

## 2019-11-04 MED ORDER — SODIUM CHLORIDE 0.9% FLUSH
10.0000 mL | INTRAVENOUS | Status: DC | PRN
  Administered 2019-11-04: 10 mL
  Filled 2019-11-04: qty 10

## 2019-11-04 MED ORDER — ALTEPLASE 2 MG IJ SOLR
2.0000 mg | Freq: Once | INTRAMUSCULAR | Status: DC | PRN
  Filled 2019-11-04: qty 2

## 2019-11-04 MED ORDER — DEXTROSE 5 % IV SOLN
36.0000 mg/m2 | Freq: Once | INTRAVENOUS | Status: AC
  Administered 2019-11-04: 80 mg via INTRAVENOUS
  Filled 2019-11-04: qty 10

## 2019-11-04 MED FILL — OMEPRAZOLE 20 MG CAP: 20 | 30 days supply | Qty: 30 | Fill #2

## 2019-11-04 NOTE — Patient Instructions (Signed)

## 2019-11-04 NOTE — Progress Notes (Signed)
Did not want to stay accessed for 9/11 treatment.

## 2019-11-04 NOTE — Progress Notes (Signed)
Per pt., she took Decadron 40 mg po this morning prior to treatment.

## 2019-11-04 NOTE — Patient Instructions (Signed)
[facility] Discharge Instructions for Patients Receiving Chemotherapy  Today you received the following chemotherapy agents: Kyprolis and Cytoxan  To help prevent nausea and vomiting after your treatment, we encourage you to take your nausea medication as directed by your MD.   If you develop nausea and vomiting that is not controlled by your nausea medication, call the clinic.   BELOW ARE SYMPTOMS THAT SHOULD BE REPORTED IMMEDIATELY:  *FEVER GREATER THAN 100.5 F  *CHILLS WITH OR WITHOUT FEVER  NAUSEA AND VOMITING THAT IS NOT CONTROLLED WITH YOUR NAUSEA MEDICATION  *UNUSUAL SHORTNESS OF BREATH  *UNUSUAL BRUISING OR BLEEDING  TENDERNESS IN MOUTH AND THROAT WITH OR WITHOUT PRESENCE OF ULCERS  *URINARY PROBLEMS  *BOWEL PROBLEMS  UNUSUAL RASH Items with * indicate a potential emergency and should be followed up as soon as possible.  Feel free to call the clinic should you have any questions or concerns. The clinic phone number is [phone].  Please show the [place] at check-in to the Emergency Department and triage nurse.

## 2019-11-05 ENCOUNTER — Inpatient Hospital Stay: Payer: Self-pay

## 2019-11-05 VITALS — BP 130/75 | HR 64 | Temp 98.6°F | Resp 18

## 2019-11-05 DIAGNOSIS — C9000 Multiple myeloma not having achieved remission: Secondary | ICD-10-CM

## 2019-11-05 MED ORDER — SODIUM CHLORIDE 0.9% FLUSH
10.0000 mL | INTRAVENOUS | Status: DC | PRN
  Administered 2019-11-05: 10 mL
  Filled 2019-11-05: qty 10

## 2019-11-05 MED ORDER — DEXTROSE 5 % IV SOLN
36.0000 mg/m2 | Freq: Once | INTRAVENOUS | Status: AC
  Administered 2019-11-05: 80 mg via INTRAVENOUS
  Filled 2019-11-05: qty 30

## 2019-11-05 MED ORDER — PROCHLORPERAZINE MALEATE 10 MG PO TABS
ORAL_TABLET | ORAL | Status: AC
  Filled 2019-11-05: qty 1

## 2019-11-05 MED ORDER — PROCHLORPERAZINE MALEATE 10 MG PO TABS
10.0000 mg | ORAL_TABLET | Freq: Once | ORAL | Status: AC
  Administered 2019-11-05: 10 mg via ORAL

## 2019-11-05 MED ORDER — SODIUM CHLORIDE 0.9 % IV SOLN
Freq: Once | INTRAVENOUS | Status: DC
  Filled 2019-11-05: qty 250

## 2019-11-05 MED ORDER — SODIUM CHLORIDE 0.9 % IV SOLN
Freq: Once | INTRAVENOUS | Status: AC
  Filled 2019-11-05: qty 250

## 2019-11-05 MED ORDER — HEPARIN SOD (PORK) LOCK FLUSH 100 UNIT/ML IV SOLN
500.0000 [IU] | Freq: Once | INTRAVENOUS | Status: AC | PRN
  Administered 2019-11-05: 500 [IU]
  Filled 2019-11-05: qty 5

## 2019-11-05 NOTE — Patient Instructions (Signed)
[facility] Discharge Instructions for Patients Receiving Chemotherapy  Today you received the following chemotherapy agents:  Kyprolis  To help prevent nausea and vomiting after your treatment, we encourage you to take your nausea medication as prescirbed.   If you develop nausea and vomiting that is not controlled by your nausea medication, call the clinic.   BELOW ARE SYMPTOMS THAT SHOULD BE REPORTED IMMEDIATELY:  *FEVER GREATER THAN 100.5 F  *CHILLS WITH OR WITHOUT FEVER  NAUSEA AND VOMITING THAT IS NOT CONTROLLED WITH YOUR NAUSEA MEDICATION  *UNUSUAL SHORTNESS OF BREATH  *UNUSUAL BRUISING OR BLEEDING  TENDERNESS IN MOUTH AND THROAT WITH OR WITHOUT PRESENCE OF ULCERS  *URINARY PROBLEMS  *BOWEL PROBLEMS  UNUSUAL RASH Items with * indicate a potential emergency and should be followed up as soon as possible.  Feel free to call the clinic should you have any questions or concerns. The clinic phone number is [phone].  Please show the [place] at check-in to the Emergency Department and triage nurse.

## 2019-11-08 NOTE — Progress Notes (Signed)
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  PRIOR THERAPY: 1)Systemic therapy with Velcade 1.3 mg/M2 weekly, Revlimid 25 mg p.o. daily for 14 days every 3 weeks in addition to weekly Decadron 40 mg orally. First dose of treatment 10/08/2017.Treatment was placed on hold due to development of a significant rash.Sheresumedtreatment withonlydexamethasone and Velcade on 11/19/2017.This was discontinued on 04/14/2019 due to evidence of disease progression. 2)Pomalyst 4 mg p.o. for 21 days every 4 weeks and 40 mg p.o. Decadron once a week. First dose3/06/2019-05/04/2019.Status post 6 days of treatment. Discontinued secondary to allergy to Pomalyst.  CURRENT THERAPY: Chemotherapy with carfilzomibon days 1, 2, 8, 9, 15, and 16and Cytoxan 300 mg/m2 on days 1,8, and 15 IV every 4 weeks and 40 mg p.o. weekly of Decadron. First dose expected on 05/12/2019. Status post 6 cycles.  INTERVAL HISTORY: Debbie Chan 69 y.o. female returns to the clinic today for a follow-up visit. The patient is feeling unwelltoday. She started experiencing diarrhea on Saturday last week which has persisted until yesterday. This morning was her first normal bowel movement.  She denies any abdominal pain or blood in the stool.  She did report that 1 day she had "black stool".  She denies any fevers, nausea, or vomiting.  Her son brought her Imodium which helped "a little".  She did note that she had a runny nose and cough which has since improved.  She denies any recent fever, chills, night sweats.  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 Gabapentin was discontinued secondary to extremity swelling.She denies any abnormal bleeding or bruising. She denies any signs and symptoms of infection including sore throat, nasal congestion, cough, shortness of  breath, skin infections, or dysuria. She had a repeat myeloma panel performed earlier this month. She is here today for evaluation and to review her myeloma panel before starting Day8 cycle #7.   MEDICAL HISTORY: Past Medical History:  Diagnosis Date  . Cancer ([place])     ALLERGIES:  has No Known Allergies.  MEDICATIONS:  Current Outpatient Medications  Medication Sig Dispense Refill  . acyclovir (ZOVIRAX) 200 MG capsule Take 1 capsule (200 mg total) by mouth 2 (two) times daily. 60 capsule 4  . dexamethasone (DECADRON) 4 MG tablet Take 10 tablets (40 mg total) by mouth every 7 (seven) days. 40 tablet 3  . furosemide (LASIX) 40 MG tablet Take 40 mg by mouth daily as needed.    . lidocaine-prilocaine (EMLA) cream Apply 1 application topically as needed. 30 g 2  . metoprolol tartrate (LOPRESSOR) 25 MG tablet TAKE 1/2 TABLET BY MOUTH TWICE DAILY 30 tablet 3  . omeprazole (PRILOSEC) 20 MG capsule Take 1 capsule (20 mg total) by mouth daily. 30 capsule 4  . blood glucose meter kit and supplies Dispense based on patient and insurance preference. Use up to four times daily as directed. (FOR ICD-10 E10.9, E11.9). (Patient not taking: Reported on 09/01/2019) 1 each 0  . diphenhydrAMINE (BENADRYL ALLERGY) 25 MG tablet Take 1 tablet (25 mg total) by mouth every 6 (six) hours as needed for itching. (Patient not taking: Reported on 09/01/2019) 100 tablet 0  . oxyCODONE-acetaminophen (PERCOCET) 5-325 MG tablet Take 2 tablets by mouth every 6 (six) hours as needed. (Patient not taking: Reported on 09/01/2019) 20 tablet 0  . prochlorperazine (COMPAZINE) 10 MG tablet Take 1 tablet (10 mg total) by mouth  every 6 (six) hours as needed for nausea or vomiting. (Patient not taking: Reported on 09/01/2019) 30 tablet 2  . RELION GLUCOSE TEST STRIPS test strip USE 1 TO CHECK GLUCOSE ONCE DAILY (Patient not taking: Reported on 09/01/2019)    . ReliOn Ultra Thin Lancets 30G MISC USE 1 TO CHECK GLUCOSE ONCE DAILY FOR GLUCOSE  TESTING (Patient not taking: Reported on 09/01/2019)    . traMADol (ULTRAM) 50 MG tablet Take 50 mg by mouth every 8 (eight) hours as needed. (Patient not taking: Reported on 09/01/2019)     Current Facility-Administered Medications  Medication Dose Route Frequency Provider Last Rate Last Admin  . sodium chloride flush (NS) 0.9 % injection 10 mL  10 mL Intravenous PRN Brittay Mogle L, PA-C   10 mL at 11/10/19 1137    SURGICAL HISTORY:  Past Surgical History:  Procedure Laterality Date  . IR IMAGING GUIDED PORT INSERTION  05/10/2019    REVIEW OF SYSTEMS:   Review of Systems  Constitutional: Negative for appetite change, chills, fatigue, fever and unexpected weight change.  HENT: Positive for runny nose (improved).  Negative for mouth sores, nosebleeds, sore throat and trouble swallowing.   Eyes: Negative for eye problems and icterus.  Respiratory: Positive for cough (improved).  Negative for hemoptysis, shortness of breath and wheezing.   Cardiovascular: Negative for chest pain and leg swelling.  Gastrointestinal: Positive for diarrhea.  Negative for abdominal pain, constipation, nausea and vomiting.  Genitourinary: Negative for bladder incontinence, difficulty urinating, dysuria, frequency and hematuria.   Musculoskeletal: Negative for back pain, gait problem, neck pain and neck stiffness.  Skin: Negative for itching and rash.  Neurological: Negative for dizziness, extremity weakness, gait problem, headaches, light-headedness and seizures.  Hematological: Negative for adenopathy. Does not bruise/bleed easily.  Psychiatric/Behavioral: Negative for confusion, depression and sleep disturbance. The patient is not nervous/anxious.     PHYSICAL EXAMINATION:  Blood pressure (!) 154/87, pulse 68, temperature 98 F (36.7 C), temperature source Tympanic, resp. rate 18, height _0  (1.727 m), weight 194 lb 4.8 oz (88.1 kg), SpO2 100 %.  ECOG PERFORMANCE STATUS: 1 - Symptomatic but  completely ambulatory  Physical Exam  Constitutional: Oriented to person, place, and time and well-developed, well-nourished, and in no distress.  HENT:  Head: Normocephalic and atraumatic.  Mouth/Throat: Oropharynx is clear and moist. No oropharyngeal exudate.  Eyes: Conjunctivae are normal. Right eye exhibits no discharge. Left eye exhibits no discharge. No scleral icterus.  Neck: Normal range of motion. Neck supple.  Cardiovascular: Normal rate, regular rhythm, normal heart sounds and intact distal pulses.  Pulmonary/Chest: Effort normal and breath sounds normal. No respiratory distress. No wheezes. No rales.  Abdominal: Soft. Bowel sounds are normal. Exhibits no distension and no mass. There is no tenderness.  Musculoskeletal: Mild bilateral lower extremity swelling.Normal range of motion.  Lymphadenopathy:  No cervical adenopathy.  Neurological: Alert and oriented to person, place, and time. Exhibits normal muscle tone. Gait normal. Coordination normal.  Skin: Skin is warm. Legs/feet moist secondary to patient using Vaseline on her feet/legs. No rash noted. Not diaphoretic. No erythema. No pallor. Discolored and thickened toe nails.  Psychiatric: Mood, memory and judgment normal.  Vitals reviewed.  LABORATORY DATA: Lab Results  Component Value Date   WBC 5.0 11/10/2019   HGB 10.8 (L) 11/10/2019   HCT 32.6 (L) 11/10/2019   MCV 94.5 11/10/2019   PLT 52 (L) 11/10/2019      Chemistry      Component Value Date/Time  NA 137 11/10/2019 0935   K 5.0 11/10/2019 0935   CL 106 11/10/2019 0935   CO2 25 11/10/2019 0935   BUN 15 11/10/2019 0935   CREATININE 1.35 (H) 11/10/2019 0935      Component Value Date/Time   CALCIUM 8.9 11/10/2019 0935   ALKPHOS 67 11/10/2019 0935   AST 15 11/10/2019 0935   ALT 8 11/10/2019 0935   BILITOT 0.5 11/10/2019 0935       RADIOGRAPHIC STUDIES:  No results found.   ASSESSMENT/PLAN:  This is a very pleasant 69years old African female  originally from [place] who is visiting her son in [place] and was diagnosed with IgA multiple myeloma.  The patient was a started initially on treatment with weekly subcutaneous Velcade 1.3 mg/M2, Revlimid 25 mg p.o. daily for 21 days every 4 weeks as well as weekly Decadron 40 mg orally. She was tolerating the treatment well but she developed significant skin rash secondary to treatment with Revlimid and this was discontinued.  The patient continued treatment with subcutaneous Velcade and Decadron.She developed evidence of disease progression in February 2021. Her treatment was discontinued.  The patient was then started on Pomalyst 4 mg p.o. daily for 21 days on every 4 weeks with weekly Decadron 40 mg p.o. She is status post 6 days of treatment. She developed a significant skin rash/reaction to Pomalyst.This was discontinued.  The patient is currently undergoing treatment withCarfilzomibon days 1, 2, 8, 9, 15, and 16 and Cytoxan on days 1, 8, and 15 IV every 4 weeks with 40 mg p.o. weekly of Decadron.She is status post 6 cycles.  The patient was seen with Dr. Julien Nordmann today. Dr. Julien Nordmann reviewed her myeloma panel with her. The labs showed thrombocytopenia today.  Dr. Julien Nordmann recommends that she skip this week cycle of treatment to allow more time to recover.   We will see her back for a follow up visit in 3 weeks for evaluation before starting her next cycle of treatment.   Discussed that her Decadron and acyclovir have refills at the pharmacy.  I encouraged her to recheck the pharmacy if she ever runs out of these medications.  She is advised to continue to use Imodium if needed for her diarrhea.  The patient was advised to call immediately if she has any concerning symptoms in the interval. The patient voices understanding of current disease status and treatment options and is in agreement with the current care plan. All questions were answered. The patient  knows to call the clinic with any problems, questions or concerns. We can certainly see the patient much sooner if necessary   No orders of the defined types were placed in this encounter.    Devlyn Retter L Milta Croson, PA-C 11/10/19  ADDENDUM: Hematology/Oncology Attending: I had a face-to-face encounter with the patient today.  I recommended her care plan.  This is a very pleasant 70 years old African-American female with history of multiple myeloma and currently undergoing treatment with carfilzomib, Cytoxan and Decadron status post 6 cycles. The patient has been tolerating this treatment well except for fatigue and thrombocytopenia. Over the last several days she has multiple episodes of diarrhea on daily basis.  She was not taking any medication but yesterday her son was able to get the patient Imodium and she felt much better after receiving Imodium. She had repeat myeloma panel performed recently.  I personally discussed the lab results with the patient and it showed no concerning findings for progression. I recommended for  the patient to continue her current treatment with carfilzomib, Cytoxan and Decadron but we will hold her treatment for day 8 and 9 this week because of the thrombocytopenia and the recent diarrhea. The patient will come back for follow-up visit in 3 weeks for evaluation before the next cycle of her treatment. She was advised to call immediately if she has any concerning symptoms in the interval.  Disclaimer: This note was dictated with voice recognition software. Similar sounding words can inadvertently be transcribed and may be missed upon review. Eilleen Kempf, MD 11/10/19

## 2019-11-10 ENCOUNTER — Inpatient Hospital Stay: Payer: Self-pay

## 2019-11-10 ENCOUNTER — Encounter: Payer: Self-pay | Admitting: Physician Assistant

## 2019-11-10 ENCOUNTER — Other Ambulatory Visit: Payer: Self-pay

## 2019-11-10 ENCOUNTER — Inpatient Hospital Stay (HOSPITAL_BASED_OUTPATIENT_CLINIC_OR_DEPARTMENT_OTHER): Payer: Self-pay | Admitting: Physician Assistant

## 2019-11-10 VITALS — BP 154/87 | HR 68 | Temp 98.0°F | Resp 18 | Ht 68.0 in | Wt 194.3 lb

## 2019-11-10 DIAGNOSIS — Z95828 Presence of other vascular implants and grafts: Secondary | ICD-10-CM

## 2019-11-10 DIAGNOSIS — C9000 Multiple myeloma not having achieved remission: Secondary | ICD-10-CM

## 2019-11-10 LAB — CMP (CANCER CENTER ONLY)
ALT: 8 U/L (ref 0–44)
AST: 15 U/L (ref 15–41)
Albumin: 3.6 g/dL (ref 3.5–5.0)
Alkaline Phosphatase: 67 U/L (ref 38–126)
Anion gap: 6 (ref 5–15)
BUN: 15 mg/dL (ref 8–23)
CO2: 25 mmol/L (ref 22–32)
Calcium: 8.9 mg/dL (ref 8.9–10.3)
Chloride: 106 mmol/L (ref 98–111)
Creatinine: 1.35 mg/dL — ABNORMAL HIGH (ref 0.44–1.00)
GFR, Est AFR Am: 46 mL/min — ABNORMAL LOW (ref >60)
GFR, Estimated: 40 mL/min — ABNORMAL LOW (ref >60)
Glucose, Bld: 102 mg/dL — ABNORMAL HIGH (ref 70–99)
Potassium: 5 mmol/L (ref 3.5–5.1)
Sodium: 137 mmol/L (ref 135–145)
Total Bilirubin: 0.5 mg/dL (ref 0.3–1.2)
Total Protein: 6.6 g/dL (ref 6.5–8.1)

## 2019-11-10 LAB — CBC WITH DIFFERENTIAL (CANCER CENTER ONLY)
Abs Immature Granulocytes: 0.04 10*3/uL (ref 0.00–0.07)
Basophils Absolute: 0 10*3/uL (ref 0.0–0.1)
Basophils Relative: 1 %
Eosinophils Absolute: 0.2 10*3/uL (ref 0.0–0.5)
Eosinophils Relative: 4 %
HCT: 32.6 % — ABNORMAL LOW (ref 36.0–46.0)
Hemoglobin: 10.8 g/dL — ABNORMAL LOW (ref 12.0–15.0)
Immature Granulocytes: 1 %
Lymphocytes Relative: 8 %
Lymphs Abs: 0.4 10*3/uL — ABNORMAL LOW (ref 0.7–4.0)
MCH: 31.3 pg (ref 26.0–34.0)
MCHC: 33.1 g/dL (ref 30.0–36.0)
MCV: 94.5 fL (ref 80.0–100.0)
Monocytes Absolute: 0.2 10*3/uL (ref 0.1–1.0)
Monocytes Relative: 4 %
Neutro Abs: 4.1 10*3/uL (ref 1.7–7.7)
Neutrophils Relative %: 82 %
Platelet Count: 52 10*3/uL — ABNORMAL LOW (ref 150–400)
RBC: 3.45 MIL/uL — ABNORMAL LOW (ref 3.87–5.11)
RDW: 13.2 % (ref 11.5–15.5)
WBC Count: 5 10*3/uL (ref 4.0–10.5)
nRBC: 0.4 % — ABNORMAL HIGH (ref 0.0–0.2)

## 2019-11-10 MED ORDER — HEPARIN SOD (PORK) LOCK FLUSH 100 UNIT/ML IV SOLN
500.0000 [IU] | Freq: Once | INTRAVENOUS | Status: AC
  Administered 2019-11-10: 500 [IU] via INTRAVENOUS
  Filled 2019-11-10: qty 5

## 2019-11-10 MED ORDER — SODIUM CHLORIDE 0.9% FLUSH
10.0000 mL | INTRAVENOUS | Status: DC | PRN
  Administered 2019-11-10: 10 mL via INTRAVENOUS
  Filled 2019-11-10: qty 10

## 2019-11-10 MED FILL — ACYCLOVIR 200 MG CAP: 200 | 30 days supply | Qty: 60 | Fill #1

## 2019-11-10 MED FILL — DEXAMETHASONE 4 MG TABLET: 4 | 28 days supply | Qty: 40 | Fill #1

## 2019-11-11 ENCOUNTER — Inpatient Hospital Stay: Payer: Self-pay

## 2019-11-15 ENCOUNTER — Telehealth: Payer: Self-pay | Admitting: Physician Assistant

## 2019-11-15 NOTE — Telephone Encounter (Signed)
Called and left msg about added appts to schedule. Mailed printout  

## 2019-11-17 ENCOUNTER — Inpatient Hospital Stay: Payer: Self-pay

## 2019-11-17 ENCOUNTER — Other Ambulatory Visit: Payer: Self-pay

## 2019-11-17 VITALS — BP 143/63 | HR 58 | Temp 98.2°F | Resp 18 | Wt 195.0 lb

## 2019-11-17 DIAGNOSIS — C9000 Multiple myeloma not having achieved remission: Secondary | ICD-10-CM

## 2019-11-17 DIAGNOSIS — Z95828 Presence of other vascular implants and grafts: Secondary | ICD-10-CM

## 2019-11-17 LAB — CMP (CANCER CENTER ONLY)
ALT: 12 U/L (ref 0–44)
AST: 13 U/L — ABNORMAL LOW (ref 15–41)
Albumin: 3.6 g/dL (ref 3.5–5.0)
Alkaline Phosphatase: 58 U/L (ref 38–126)
Anion gap: 5 (ref 5–15)
BUN: 18 mg/dL (ref 8–23)
CO2: 28 mmol/L (ref 22–32)
Calcium: 9.3 mg/dL (ref 8.9–10.3)
Chloride: 107 mmol/L (ref 98–111)
Creatinine: 1.45 mg/dL — ABNORMAL HIGH (ref 0.44–1.00)
GFR, Est AFR Am: 42 mL/min — ABNORMAL LOW (ref >60)
GFR, Estimated: 37 mL/min — ABNORMAL LOW (ref >60)
Glucose, Bld: 91 mg/dL (ref 70–99)
Potassium: 5 mmol/L (ref 3.5–5.1)
Sodium: 140 mmol/L (ref 135–145)
Total Bilirubin: 0.5 mg/dL (ref 0.3–1.2)
Total Protein: 6.4 g/dL — ABNORMAL LOW (ref 6.5–8.1)

## 2019-11-17 LAB — CBC WITH DIFFERENTIAL (CANCER CENTER ONLY)
Abs Immature Granulocytes: 0.01 10*3/uL (ref 0.00–0.07)
Basophils Absolute: 0 10*3/uL (ref 0.0–0.1)
Basophils Relative: 0 %
Eosinophils Absolute: 0.1 10*3/uL (ref 0.0–0.5)
Eosinophils Relative: 3 %
HCT: 34 % — ABNORMAL LOW (ref 36.0–46.0)
Hemoglobin: 11.1 g/dL — ABNORMAL LOW (ref 12.0–15.0)
Immature Granulocytes: 0 %
Lymphocytes Relative: 8 %
Lymphs Abs: 0.4 10*3/uL — ABNORMAL LOW (ref 0.7–4.0)
MCH: 31.9 pg (ref 26.0–34.0)
MCHC: 32.6 g/dL (ref 30.0–36.0)
MCV: 97.7 fL (ref 80.0–100.0)
Monocytes Absolute: 0.1 10*3/uL (ref 0.1–1.0)
Monocytes Relative: 3 %
Neutro Abs: 4.6 10*3/uL (ref 1.7–7.7)
Neutrophils Relative %: 86 %
Platelet Count: 148 10*3/uL — ABNORMAL LOW (ref 150–400)
RBC: 3.48 MIL/uL — ABNORMAL LOW (ref 3.87–5.11)
RDW: 14.7 % (ref 11.5–15.5)
WBC Count: 5.3 10*3/uL (ref 4.0–10.5)
nRBC: 0 % (ref 0.0–0.2)

## 2019-11-17 MED ORDER — SODIUM CHLORIDE 0.9% FLUSH
10.0000 mL | INTRAVENOUS | Status: DC | PRN
  Administered 2019-11-17: 10 mL
  Filled 2019-11-17: qty 10

## 2019-11-17 MED ORDER — DEXTROSE 5 % IV SOLN
36.0000 mg/m2 | Freq: Once | INTRAVENOUS | Status: AC
  Administered 2019-11-17: 80 mg via INTRAVENOUS
  Filled 2019-11-17: qty 30

## 2019-11-17 MED ORDER — SODIUM CHLORIDE 0.9 % IV SOLN
300.0000 mg/m2 | Freq: Once | INTRAVENOUS | Status: AC
  Administered 2019-11-17: 640 mg via INTRAVENOUS
  Filled 2019-11-17: qty 32

## 2019-11-17 MED ORDER — SODIUM CHLORIDE 0.9 % IV SOLN
Freq: Once | INTRAVENOUS | Status: AC
  Filled 2019-11-17: qty 250

## 2019-11-17 MED ORDER — PALONOSETRON HCL INJECTION 0.25 MG/5ML
INTRAVENOUS | Status: AC
  Filled 2019-11-17: qty 5

## 2019-11-17 MED ORDER — HEPARIN SOD (PORK) LOCK FLUSH 100 UNIT/ML IV SOLN
500.0000 [IU] | Freq: Once | INTRAVENOUS | Status: AC | PRN
  Administered 2019-11-17: 500 [IU]
  Filled 2019-11-17: qty 5

## 2019-11-17 MED ORDER — PALONOSETRON HCL INJECTION 0.25 MG/5ML
0.2500 mg | Freq: Once | INTRAVENOUS | Status: AC
  Administered 2019-11-17: 0.25 mg via INTRAVENOUS

## 2019-11-17 NOTE — Patient Instructions (Signed)
[facility] Discharge Instructions for Patients Receiving Chemotherapy  Today you received the following chemotherapy agents Kyprolis and Cytoxan  To help prevent nausea and vomiting after your treatment, we encourage you to take your nausea medication as directed   If you develop nausea and vomiting that is not controlled by your nausea medication, call the clinic.   BELOW ARE SYMPTOMS THAT SHOULD BE REPORTED IMMEDIATELY:  *FEVER GREATER THAN 100.5 F  *CHILLS WITH OR WITHOUT FEVER  NAUSEA AND VOMITING THAT IS NOT CONTROLLED WITH YOUR NAUSEA MEDICATION  *UNUSUAL SHORTNESS OF BREATH  *UNUSUAL BRUISING OR BLEEDING  TENDERNESS IN MOUTH AND THROAT WITH OR WITHOUT PRESENCE OF ULCERS  *URINARY PROBLEMS  *BOWEL PROBLEMS  UNUSUAL RASH Items with * indicate a potential emergency and should be followed up as soon as possible.  Feel free to call the clinic should you have any questions or concerns. The clinic phone number is [phone].  Please show the CHEMO ALERT CARD at check-in to the Emergency Department and triage nurse.   

## 2019-11-18 ENCOUNTER — Other Ambulatory Visit: Payer: Self-pay

## 2019-11-18 ENCOUNTER — Inpatient Hospital Stay: Payer: Self-pay

## 2019-11-18 VITALS — BP 153/89 | HR 69 | Temp 98.6°F | Resp 18

## 2019-11-18 DIAGNOSIS — C9000 Multiple myeloma not having achieved remission: Secondary | ICD-10-CM

## 2019-11-18 MED ORDER — SODIUM CHLORIDE 0.9% FLUSH
10.0000 mL | INTRAVENOUS | Status: DC | PRN
  Administered 2019-11-18: 10 mL
  Filled 2019-11-18: qty 10

## 2019-11-18 MED ORDER — SODIUM CHLORIDE 0.9 % IV SOLN
Freq: Once | INTRAVENOUS | Status: AC
  Filled 2019-11-18: qty 250

## 2019-11-18 MED ORDER — SODIUM CHLORIDE 0.9 % IV SOLN
Freq: Once | INTRAVENOUS | Status: DC
  Filled 2019-11-18: qty 250

## 2019-11-18 MED ORDER — PROCHLORPERAZINE MALEATE 10 MG PO TABS
10.0000 mg | ORAL_TABLET | Freq: Once | ORAL | Status: AC
  Administered 2019-11-18: 10 mg via ORAL

## 2019-11-18 MED ORDER — HEPARIN SOD (PORK) LOCK FLUSH 100 UNIT/ML IV SOLN
500.0000 [IU] | Freq: Once | INTRAVENOUS | Status: AC | PRN
  Administered 2019-11-18: 500 [IU]
  Filled 2019-11-18: qty 5

## 2019-11-18 MED ORDER — PROCHLORPERAZINE MALEATE 10 MG PO TABS
ORAL_TABLET | ORAL | Status: AC
  Filled 2019-11-18: qty 1

## 2019-11-18 MED ORDER — DEXTROSE 5 % IV SOLN
36.0000 mg/m2 | Freq: Once | INTRAVENOUS | Status: AC
  Administered 2019-11-18: 80 mg via INTRAVENOUS
  Filled 2019-11-18: qty 30

## 2019-11-29 NOTE — Progress Notes (Signed)
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  PRIOR THERAPY: 1)Systemic therapy with Velcade 1.3 mg/M2 weekly, Revlimid 25 mg p.o. daily for 14 days every 3 weeks in addition to weekly Decadron 40 mg orally. First dose of treatment 10/08/2017.Treatment was placed on hold due to development of a significant rash.Sheresumedtreatment withonlydexamethasone and Velcade on 11/19/2017.This was discontinued on 04/14/2019 due to evidence of disease progression. 2)Pomalyst 4 mg p.o. for 21 days every 4 weeks and 40 mg p.o. Decadron once a week. First dose3/06/2019-05/04/2019.Status post 6 days of treatment. Discontinued secondary to allergy to Pomalyst.  CURRENT THERAPY: Chemotherapy with carfilzomibon days 1, 2, 8, 9, 15, and 16and Cytoxan 300 mg/m2 on days 1,8, and 15 IV every 4 weeks and 40 mg p.o. weekly of Decadron. First dose expected on 05/12/2019. Status post7 cycles.  INTERVAL HISTORY: Willodene Stallings 69 y.o. female returns to the clinic today for a follow-up visit.  The patient is feeling fair today without any concerning complaints except for persistent hip pain in the right hip. The pain radiates down her right leg. This is exacerbated when she is standing up. She has tylenol at home but forgets to take it. The patient denies any recent fever, chills, night sweats, or weight loss.  Denies any nausea, vomiting, diarrhea, or constipation. At her last visit she was endorsing diarrhea but this has improved with the use of imodium. She continues to report bloating. Denies any headache or visual changes.  She denies any abnormal bleeding or bruising.  Denies any recent infections including nasal congestion, sore throat, cough, shortness of breath, skin infections, or dysuria.  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 She was on gabapentin but it was discontinued due to  extremity swelling. She would like to go back to [place]. She took her decadron as prescribed this morning per patient report. She is here today for evaluation before starting cycle #8   MEDICAL HISTORY: Past Medical History:  Diagnosis Date  . Cancer ([facility])     ALLERGIES:  has No Known Allergies.  MEDICATIONS:  Current Outpatient Medications  Medication Sig Dispense Refill  . acyclovir (ZOVIRAX) 200 MG capsule Take 1 capsule (200 mg total) by mouth 2 (two) times daily. 60 capsule 4  . blood glucose meter kit and supplies Dispense based on patient and insurance preference. Use up to four times daily as directed. (FOR ICD-10 E10.9, E11.9). (Patient not taking: Reported on 09/01/2019) 1 each 0  . dexamethasone (DECADRON) 4 MG tablet Take 10 tablets (40 mg total) by mouth every 7 (seven) days. 40 tablet 3  . diphenhydrAMINE (BENADRYL ALLERGY) 25 MG tablet Take 1 tablet (25 mg total) by mouth every 6 (six) hours as needed for itching. (Patient not taking: Reported on 09/01/2019) 100 tablet 0  . furosemide (LASIX) 40 MG tablet Take 40 mg by mouth daily as needed.    . lidocaine-prilocaine (EMLA) cream Apply 1 application topically as needed. 30 g 2  . metoprolol tartrate (LOPRESSOR) 25 MG tablet TAKE 1/2 TABLET BY MOUTH TWICE DAILY 30 tablet 3  . omeprazole (PRILOSEC) 20 MG capsule Take 1 capsule (20 mg total) by mouth daily. 30 capsule 4  . oxyCODONE-acetaminophen (PERCOCET) 5-325 MG tablet Take 2 tablets by mouth every 6 (six) hours as needed. (Patient not taking: Reported on 09/01/2019) 20 tablet 0  . prochlorperazine (COMPAZINE) 10 MG tablet Take 1 tablet (10 mg  total) by mouth every 6 (six) hours as needed for nausea or vomiting. (Patient not taking: Reported on 09/01/2019) 30 tablet 2  . RELION GLUCOSE TEST STRIPS test strip USE 1 TO CHECK GLUCOSE ONCE DAILY (Patient not taking: Reported on 09/01/2019)    . ReliOn Ultra Thin Lancets 30G MISC USE 1 TO CHECK GLUCOSE ONCE DAILY FOR GLUCOSE TESTING  (Patient not taking: Reported on 09/01/2019)    . traMADol (ULTRAM) 50 MG tablet Take 50 mg by mouth every 8 (eight) hours as needed. (Patient not taking: Reported on 09/01/2019)     No current facility-administered medications for this visit.    SURGICAL HISTORY:  Past Surgical History:  Procedure Laterality Date  . IR IMAGING GUIDED PORT INSERTION  05/10/2019    REVIEW OF SYSTEMS:   Review of Systems  Constitutional: Negative for appetite change, chills, fatigue, fever and unexpected weight change.  HENT:   Negative for mouth sores, nosebleeds, sore throat and trouble swallowing.   Eyes: Negative for eye problems and icterus.  Respiratory: Negative for cough, hemoptysis, shortness of breath and wheezing.   Cardiovascular: Negative for chest pain and leg swelling.  Gastrointestinal: Negative for abdominal pain, constipation, diarrhea, nausea and vomiting.  Genitourinary: Negative for bladder incontinence, difficulty urinating, dysuria, frequency and hematuria.   Musculoskeletal: Positive for right hip/back pain that radiates down her right leg. Negative for gait problem, neck pain and neck stiffness.  Skin: Negative for itching and rash.  Neurological: Negative for dizziness, extremity weakness, gait problem, headaches, light-headedness and seizures.  Hematological: Negative for adenopathy. Does not bruise/bleed easily.  Psychiatric/Behavioral: Negative for confusion, depression and sleep disturbance. The patient is not nervous/anxious.     PHYSICAL EXAMINATION:  Blood pressure 132/78, pulse 64, temperature 97.9 F (36.6 C), temperature source Tympanic, resp. rate 18, height '5\' 8"'  (1.727 m), weight 191 lb 14.4 oz (87 kg), SpO2 100 %.  ECOG PERFORMANCE STATUS: 1 - Symptomatic but completely ambulatory  Physical Exam  Constitutional: Oriented to person, place, and time and well-developed, well-nourished, and in no distress.  HENT:  Head: Normocephalic and atraumatic.  Mouth/Throat:  Oropharynx is clear and moist. No oropharyngeal exudate.  Eyes: Conjunctivae are normal. Right eye exhibits no discharge. Left eye exhibits no discharge. No scleral icterus.  Neck: Normal range of motion. Neck supple.  Cardiovascular: Normal rate, regular rhythm, normal heart sounds and intact distal pulses.  Pulmonary/Chest: Effort normal and breath sounds normal. No respiratory distress. No wheezes. No rales.  Abdominal: Soft. Bowel sounds are normal. Exhibits no distension and no mass. There is no tenderness.  Musculoskeletal: Mild bilateral lower extremity swelling.Normal range of motion.  Lymphadenopathy:  No cervical adenopathy.  Neurological: Alert and oriented to person, place, and time. Exhibits normal muscle tone. Gait normal. Coordination normal.  Skin: Skin is warm. Legs/feet moist secondary to patient using Vaseline on her feet/legs.No rash noted. Not diaphoretic. No erythema. No pallor.Discolored and thickened toe nails. Psychiatric: Mood, memory and judgment normal.  Vitals reviewed.  LABORATORY DATA: Lab Results  Component Value Date   WBC 2.8 (L) 12/01/2019   HGB 11.2 (L) 12/01/2019   HCT 33.9 (L) 12/01/2019   MCV 96.3 12/01/2019   PLT 124 (L) 12/01/2019      Chemistry      Component Value Date/Time   NA 141 12/01/2019 0935   K 4.5 12/01/2019 0935   CL 109 12/01/2019 0935   CO2 24 12/01/2019 0935   BUN 14 12/01/2019 0935   CREATININE 1.18 (H) 12/01/2019 0935  Component Value Date/Time   CALCIUM 8.8 (L) 12/01/2019 0935   ALKPHOS 62 12/01/2019 0935   AST 15 12/01/2019 0935   ALT 11 12/01/2019 0935   BILITOT 0.5 12/01/2019 0935       RADIOGRAPHIC STUDIES:  No results found.   ASSESSMENT/PLAN:  This is a very pleasant 69years old African female originally from [place] who is visiting her son in [place] and was diagnosed with IgA multiple myeloma.  The patient was a started initially on treatment with weekly subcutaneous  Velcade 1.3 mg/M2, Revlimid 25 mg p.o. daily for 21 days every 4 weeks as well as weekly Decadron 40 mg orally. She was tolerating the treatment well but she developed significant skin rash secondary to treatment with Revlimid and this was discontinued.  The patient continued treatment with subcutaneous Velcade and Decadron.She developed evidence of disease progression in February 2021. Her treatment was discontinued.  The patient was then started on Pomalyst 4 mg p.o. daily for 21 days on every 4 weeks with weekly Decadron 40 mg p.o. She is status post 6 days of treatment. She developed a significant skin rash/reaction to Pomalyst.This was discontinued.  The patient is currently undergoing treatment withCarfilzomibon days 1, 2, 8, 9, 15, and 16 and Cytoxan on days 1, 8, and 15 IV every 4 weeks with 40 mg p.o. weekly of Decadron.She is status post 7cycles.  The patient was seen with Dr. Julien Nordmann today. Labs were reviewed. Dr. Julien Nordmann recommends that proceed with cycle #8 today as scheduled. Dr. Julien Nordmann talked to the patient's son on the phone and discussed that if his mother would like to go to [place], she may take a 2-3 month break from treatment. He also discussed that the treatment will not cure her malignancy and that she would need to resume treatment upon returning.   We will arrange for the patient to have a hip x ray of the right hip today to further evaluate the etiology of her hip pain.   She will continue to take tylenol for pain if needed. I have sent refills for Prilosec and acyclovir per patient request.   We will see her back for a follow up visit in 3 weeks for evaluation and repeat lab work with a repeat CBC, CMP, myeloma panel?   The patient was advised to call immediately if she has any concerning symptoms in the interval. The patient voices understanding of current disease status and treatment options and is in agreement with the current care plan. All questions  were answered. The patient knows to call the clinic with any problems, questions or concerns. We can certainly see the patient much sooner if necessary   Orders Placed This Encounter  Procedures  . DG Hip Unilat W or W/O Pelvis 1 View Right    Standing Status:   Future    Standing Expiration Date:   11/30/2020    Order Specific Question:   Reason for Exam (SYMPTOM  OR DIAGNOSIS REQUIRED)    Answer:   Multiple Myeloma C/O right hip pain    Order Specific Question:   Preferred imaging location?    Answer:   [place], PA-C 12/01/19  ADDENDUM: Hematology/Oncology Attending: I had a face-to-face encounter with the patient today.  I recommended her care plan.  This is a very pleasant 69 years old African female with multiple myeloma status post several treatment in the past including subcutaneous Velcade, Revlimid and Decadron but Revlimid  was discontinued secondary to hypersensitivity reaction.  She continued on Velcade and Decadron for several cycles before she had evidence for disease progression.  The patient was treated with second line treatment with Pomalyst but she also developed hypersensitivity reaction with significant skin rash. She is currently on treatment with carfilzomib, Cytoxan and Decadron status post 7 cycles.  She has been tolerating this treatment well with no concerning adverse effect except for fatigue and residual peripheral neuropathy mainly in the toes.  She also complains of right hip pain with radiation to the right leg. The patient is also interested and going back to [place] to visit her family. I recommended for her to proceed with cycle #8 today as planned. We will check x-ray of the right hip to rule out any lytic lesions or other abnormality. Regarding her travel to [place], I discussed with her son this option and it would be okay for the patient to travel for 2-3 months to visit her family and come back for  treatment. She will come back for follow-up visit in 4 weeks for evaluation before starting cycle #9. The patient was advised to call immediately if she has any concerning symptoms in the interval.  Disclaimer: This note was dictated with voice recognition software. Similar sounding words can inadvertently be transcribed and may be missed upon review. Eilleen Kempf, MD 12/01/19

## 2019-12-01 ENCOUNTER — Inpatient Hospital Stay: Payer: Self-pay

## 2019-12-01 ENCOUNTER — Encounter: Payer: Self-pay | Admitting: Physician Assistant

## 2019-12-01 ENCOUNTER — Inpatient Hospital Stay: Payer: Self-pay | Attending: Internal Medicine | Admitting: Physician Assistant

## 2019-12-01 ENCOUNTER — Ambulatory Visit (HOSPITAL_COMMUNITY)
Admission: RE | Admit: 2019-12-01 | Discharge: 2019-12-01 | Disposition: A | Discharge status: Home / Self Care | Payer: Self-pay | Source: Ambulatory Visit | Attending: Physician Assistant | Admitting: Physician Assistant

## 2019-12-01 ENCOUNTER — Other Ambulatory Visit: Payer: Self-pay

## 2019-12-01 ENCOUNTER — Other Ambulatory Visit: Payer: Self-pay | Admitting: Physician Assistant

## 2019-12-01 VITALS — BP 132/78 | HR 64 | Temp 97.9°F | Resp 18 | Ht 68.0 in | Wt 191.9 lb

## 2019-12-01 DIAGNOSIS — Z79899 Other long term (current) drug therapy: Secondary | ICD-10-CM | POA: Insufficient documentation

## 2019-12-01 DIAGNOSIS — Z5111 Encounter for antineoplastic chemotherapy: Secondary | ICD-10-CM | POA: Insufficient documentation

## 2019-12-01 DIAGNOSIS — C9000 Multiple myeloma not having achieved remission: Secondary | ICD-10-CM | POA: Insufficient documentation

## 2019-12-01 DIAGNOSIS — M25551 Pain in right hip: Secondary | ICD-10-CM | POA: Insufficient documentation

## 2019-12-01 DIAGNOSIS — M25552 Pain in left hip: Secondary | ICD-10-CM | POA: Insufficient documentation

## 2019-12-01 DIAGNOSIS — M25559 Pain in unspecified hip: Secondary | ICD-10-CM | POA: Insufficient documentation

## 2019-12-01 DIAGNOSIS — K219 Gastro-esophageal reflux disease without esophagitis: Secondary | ICD-10-CM

## 2019-12-01 LAB — CMP (CANCER CENTER ONLY)
ALT: 11 U/L (ref 0–44)
AST: 15 U/L (ref 15–41)
Albumin: 3.7 g/dL (ref 3.5–5.0)
Alkaline Phosphatase: 62 U/L (ref 38–126)
Anion gap: 8 (ref 5–15)
BUN: 14 mg/dL (ref 8–23)
CO2: 24 mmol/L (ref 22–32)
Calcium: 8.8 mg/dL — ABNORMAL LOW (ref 8.9–10.3)
Chloride: 109 mmol/L (ref 98–111)
Creatinine: 1.18 mg/dL — ABNORMAL HIGH (ref 0.44–1.00)
GFR, Estimated: 47 mL/min — ABNORMAL LOW (ref >60)
Glucose, Bld: 109 mg/dL — ABNORMAL HIGH (ref 70–99)
Potassium: 4.5 mmol/L (ref 3.5–5.1)
Sodium: 141 mmol/L (ref 135–145)
Total Bilirubin: 0.5 mg/dL (ref 0.3–1.2)
Total Protein: 6.5 g/dL (ref 6.5–8.1)

## 2019-12-01 LAB — CBC WITH DIFFERENTIAL (CANCER CENTER ONLY)
Abs Immature Granulocytes: 0.01 10*3/uL (ref 0.00–0.07)
Basophils Absolute: 0 10*3/uL (ref 0.0–0.1)
Basophils Relative: 1 %
Eosinophils Absolute: 0 10*3/uL (ref 0.0–0.5)
Eosinophils Relative: 1 %
HCT: 33.9 % — ABNORMAL LOW (ref 36.0–46.0)
Hemoglobin: 11.2 g/dL — ABNORMAL LOW (ref 12.0–15.0)
Immature Granulocytes: 0 %
Lymphocytes Relative: 21 %
Lymphs Abs: 0.6 10*3/uL — ABNORMAL LOW (ref 0.7–4.0)
MCH: 31.8 pg (ref 26.0–34.0)
MCHC: 33 g/dL (ref 30.0–36.0)
MCV: 96.3 fL (ref 80.0–100.0)
Monocytes Absolute: 0.1 10*3/uL (ref 0.1–1.0)
Monocytes Relative: 3 %
Neutro Abs: 2.1 10*3/uL (ref 1.7–7.7)
Neutrophils Relative %: 74 %
Platelet Count: 124 10*3/uL — ABNORMAL LOW (ref 150–400)
RBC: 3.52 MIL/uL — ABNORMAL LOW (ref 3.87–5.11)
RDW: 14.6 % (ref 11.5–15.5)
WBC Count: 2.8 10*3/uL — ABNORMAL LOW (ref 4.0–10.5)
nRBC: 0 % (ref 0.0–0.2)

## 2019-12-01 MED ORDER — PALONOSETRON HCL INJECTION 0.25 MG/5ML
0.2500 mg | Freq: Once | INTRAVENOUS | Status: AC
  Administered 2019-12-01: 0.25 mg via INTRAVENOUS

## 2019-12-01 MED ORDER — PALONOSETRON HCL INJECTION 0.25 MG/5ML
INTRAVENOUS | Status: AC
  Filled 2019-12-01: qty 5

## 2019-12-01 MED ORDER — DEXTROSE 5 % IV SOLN
36.0000 mg/m2 | Freq: Once | INTRAVENOUS | Status: AC
  Administered 2019-12-01: 80 mg via INTRAVENOUS
  Filled 2019-12-01: qty 30

## 2019-12-01 MED ORDER — OMEPRAZOLE 20 MG PO CPDR: 20.0000 mg | DELAYED_RELEASE_CAPSULE | Freq: Every day | ORAL | 4 refills | Status: DC

## 2019-12-01 MED ORDER — SODIUM CHLORIDE 0.9% FLUSH
10.0000 mL | INTRAVENOUS | Status: DC | PRN
  Administered 2019-12-01: 10 mL
  Filled 2019-12-01: qty 10

## 2019-12-01 MED ORDER — HEPARIN SOD (PORK) LOCK FLUSH 100 UNIT/ML IV SOLN
500.0000 [IU] | Freq: Once | INTRAVENOUS | Status: AC | PRN
  Administered 2019-12-01: 500 [IU]
  Filled 2019-12-01: qty 5

## 2019-12-01 MED ORDER — SODIUM CHLORIDE 0.9 % IV SOLN
300.0000 mg/m2 | Freq: Once | INTRAVENOUS | Status: AC
  Administered 2019-12-01: 640 mg via INTRAVENOUS
  Filled 2019-12-01: qty 32

## 2019-12-01 MED ORDER — ACYCLOVIR 200 MG PO CAPS: 200.0000 mg | ORAL_CAPSULE | Freq: Two times a day (BID) | ORAL | 4 refills | Status: DC

## 2019-12-01 MED ORDER — SODIUM CHLORIDE 0.9 % IV SOLN
Freq: Once | INTRAVENOUS | Status: AC
  Filled 2019-12-01: qty 250

## 2019-12-01 MED FILL — ACYCLOVIR 200 MG CAP: 200 | 30 days supply | Qty: 60 | Fill #0

## 2019-12-01 MED FILL — OMEPRAZOLE 20 MG CAP: 20 | 30 days supply | Qty: 30 | Fill #0

## 2019-12-01 NOTE — Patient Instructions (Signed)

## 2019-12-01 NOTE — Patient Instructions (Signed)
[facility] Discharge Instructions for Patients Receiving Chemotherapy  Today you received the following chemotherapy agents: carfilzomib and cyclophosphamide.  To help prevent nausea and vomiting after your treatment, we encourage you to take your nausea medication as directed.   If you develop nausea and vomiting that is not controlled by your nausea medication, call the clinic.   BELOW ARE SYMPTOMS THAT SHOULD BE REPORTED IMMEDIATELY:  *FEVER GREATER THAN 100.5 F  *CHILLS WITH OR WITHOUT FEVER  NAUSEA AND VOMITING THAT IS NOT CONTROLLED WITH YOUR NAUSEA MEDICATION  *UNUSUAL SHORTNESS OF BREATH  *UNUSUAL BRUISING OR BLEEDING  TENDERNESS IN MOUTH AND THROAT WITH OR WITHOUT PRESENCE OF ULCERS  *URINARY PROBLEMS  *BOWEL PROBLEMS  UNUSUAL RASH Items with * indicate a potential emergency and should be followed up as soon as possible.  Feel free to call the clinic should you have any questions or concerns. The clinic phone number is [phone].  Please show the CHEMO ALERT CARD at check-in to the Emergency Department and triage nurse.   

## 2019-12-02 ENCOUNTER — Inpatient Hospital Stay: Payer: Self-pay

## 2019-12-02 VITALS — BP 153/91 | HR 70 | Temp 97.7°F | Resp 18

## 2019-12-02 DIAGNOSIS — C9000 Multiple myeloma not having achieved remission: Secondary | ICD-10-CM

## 2019-12-02 MED ORDER — HEPARIN SOD (PORK) LOCK FLUSH 100 UNIT/ML IV SOLN
500.0000 [IU] | Freq: Once | INTRAVENOUS | Status: AC | PRN
  Administered 2019-12-02: 500 [IU]
  Filled 2019-12-02: qty 5

## 2019-12-02 MED ORDER — SODIUM CHLORIDE 0.9 % IV SOLN
Freq: Once | INTRAVENOUS | Status: AC
  Filled 2019-12-02: qty 250

## 2019-12-02 MED ORDER — PROCHLORPERAZINE MALEATE 10 MG PO TABS
10.0000 mg | ORAL_TABLET | Freq: Once | ORAL | Status: AC
  Administered 2019-12-02: 10 mg via ORAL

## 2019-12-02 MED ORDER — DEXTROSE 5 % IV SOLN
36.0000 mg/m2 | Freq: Once | INTRAVENOUS | Status: AC
  Administered 2019-12-02: 80 mg via INTRAVENOUS
  Filled 2019-12-02: qty 30

## 2019-12-02 MED ORDER — PROCHLORPERAZINE MALEATE 10 MG PO TABS
ORAL_TABLET | ORAL | Status: AC
  Filled 2019-12-02: qty 1

## 2019-12-02 MED ORDER — SODIUM CHLORIDE 0.9% FLUSH
10.0000 mL | INTRAVENOUS | Status: DC | PRN
  Administered 2019-12-02: 10 mL
  Filled 2019-12-02: qty 10

## 2019-12-02 MED ORDER — SODIUM CHLORIDE 0.9 % IV SOLN
Freq: Once | INTRAVENOUS | Status: DC
  Filled 2019-12-02: qty 250

## 2019-12-02 NOTE — Patient Instructions (Signed)
[facility] Discharge Instructions for Patients Receiving Chemotherapy  Today you received the following chemotherapy agents:  Kyprolis  To help prevent nausea and vomiting after your treatment, we encourage you to take your nausea medication as prescirbed.   If you develop nausea and vomiting that is not controlled by your nausea medication, call the clinic.   BELOW ARE SYMPTOMS THAT SHOULD BE REPORTED IMMEDIATELY:  *FEVER GREATER THAN 100.5 F  *CHILLS WITH OR WITHOUT FEVER  NAUSEA AND VOMITING THAT IS NOT CONTROLLED WITH YOUR NAUSEA MEDICATION  *UNUSUAL SHORTNESS OF BREATH  *UNUSUAL BRUISING OR BLEEDING  TENDERNESS IN MOUTH AND THROAT WITH OR WITHOUT PRESENCE OF ULCERS  *URINARY PROBLEMS  *BOWEL PROBLEMS  UNUSUAL RASH Items with * indicate a potential emergency and should be followed up as soon as possible.  Feel free to call the clinic should you have any questions or concerns. The clinic phone number is [phone].  Please show the [facility] at check-in to the Emergency Department and triage nurse.

## 2019-12-06 ENCOUNTER — Telehealth: Payer: Self-pay | Admitting: Physician Assistant

## 2019-12-06 NOTE — Telephone Encounter (Signed)
Scheduled per los. Called and left msg of added appts

## 2019-12-08 ENCOUNTER — Inpatient Hospital Stay: Payer: Self-pay | Admitting: Internal Medicine

## 2019-12-08 ENCOUNTER — Inpatient Hospital Stay: Payer: Self-pay

## 2019-12-08 ENCOUNTER — Other Ambulatory Visit: Payer: Self-pay

## 2019-12-08 VITALS — BP 176/83 | HR 59 | Temp 98.9°F | Resp 20 | Wt 194.0 lb

## 2019-12-08 DIAGNOSIS — Z95828 Presence of other vascular implants and grafts: Secondary | ICD-10-CM

## 2019-12-08 DIAGNOSIS — C9000 Multiple myeloma not having achieved remission: Secondary | ICD-10-CM

## 2019-12-08 LAB — CMP (CANCER CENTER ONLY)
ALT: 14 U/L (ref 0–44)
AST: 15 U/L (ref 15–41)
Albumin: 3.6 g/dL (ref 3.5–5.0)
Alkaline Phosphatase: 67 U/L (ref 38–126)
Anion gap: 7 (ref 5–15)
BUN: 17 mg/dL (ref 8–23)
CO2: 26 mmol/L (ref 22–32)
Calcium: 9.4 mg/dL (ref 8.9–10.3)
Chloride: 110 mmol/L (ref 98–111)
Creatinine: 1.31 mg/dL — ABNORMAL HIGH (ref 0.44–1.00)
GFR, Estimated: 41 mL/min — ABNORMAL LOW (ref >60)
Glucose, Bld: 114 mg/dL — ABNORMAL HIGH (ref 70–99)
Potassium: 4.7 mmol/L (ref 3.5–5.1)
Sodium: 143 mmol/L (ref 135–145)
Total Bilirubin: 0.6 mg/dL (ref 0.3–1.2)
Total Protein: 6.4 g/dL — ABNORMAL LOW (ref 6.5–8.1)

## 2019-12-08 LAB — CBC WITH DIFFERENTIAL (CANCER CENTER ONLY)
Abs Immature Granulocytes: 0.02 10*3/uL (ref 0.00–0.07)
Basophils Absolute: 0 10*3/uL (ref 0.0–0.1)
Basophils Relative: 1 %
Eosinophils Absolute: 0.1 10*3/uL (ref 0.0–0.5)
Eosinophils Relative: 2 %
HCT: 32 % — ABNORMAL LOW (ref 36.0–46.0)
Hemoglobin: 10.7 g/dL — ABNORMAL LOW (ref 12.0–15.0)
Immature Granulocytes: 1 %
Lymphocytes Relative: 15 %
Lymphs Abs: 0.5 10*3/uL — ABNORMAL LOW (ref 0.7–4.0)
MCH: 32.7 pg (ref 26.0–34.0)
MCHC: 33.4 g/dL (ref 30.0–36.0)
MCV: 97.9 fL (ref 80.0–100.0)
Monocytes Absolute: 0.2 10*3/uL (ref 0.1–1.0)
Monocytes Relative: 4 %
Neutro Abs: 2.8 10*3/uL (ref 1.7–7.7)
Neutrophils Relative %: 77 %
Platelet Count: 55 10*3/uL — ABNORMAL LOW (ref 150–400)
RBC: 3.27 MIL/uL — ABNORMAL LOW (ref 3.87–5.11)
RDW: 14.5 % (ref 11.5–15.5)
WBC Count: 3.6 10*3/uL — ABNORMAL LOW (ref 4.0–10.5)
nRBC: 0 % (ref 0.0–0.2)

## 2019-12-08 MED ORDER — SODIUM CHLORIDE 0.9% FLUSH
10.0000 mL | INTRAVENOUS | Status: DC | PRN
  Administered 2019-12-08: 10 mL
  Filled 2019-12-08: qty 10

## 2019-12-08 MED ORDER — HEPARIN SOD (PORK) LOCK FLUSH 100 UNIT/ML IV SOLN
500.0000 [IU] | Freq: Once | INTRAVENOUS | Status: AC | PRN
  Administered 2019-12-08: 500 [IU]
  Filled 2019-12-08: qty 5

## 2019-12-08 NOTE — Patient Instructions (Addendum)
Pancytopenia Pancytopenia is a condition in which a person has an abnormally low amount (deficiency) of the following blood cells:  Red blood cells (RBCs). Having too few RBCs is called anemia.  White blood cells (WBCs). Having too few WBCs is called leukopenia.  Cells that help the blood clot (platelets). Having too few platelets is called thrombocytopenia. Cells that become blood cells (stem cells) are made in the soft tissue inside the bones (bone marrow). All blood cells have a limited lifespan. Blood cells are constantly replaced with new blood cells from the bone marrow. Pancytopenia can be caused by any condition or disease that:  Destroys the ability of bone marrow to make blood cells.  Causes bone marrow to make blood cells that cannot survive after they leave the bone marrow. What are the causes? There are many possible causes of this condition. In some cases, the cause is not known. Common causes of the condition include:  A disease that causes bone marrow to make immature blood cells (megaloblastic anemia).  A blood disorder that makes bone marrow unable to produce enough new RBCs (aplastic anemia or bone marrow failure).  An enlarged spleen (hypersplenism). An enlarged spleen can trap blood cells and destroy them faster than they can be replaced.  Inherited diseases of the blood or bone marrow.  Cancers that affect bone marrow.  Certain medicines, such as: ? Chemotherapy. ? Medicines that reduce the activity of the immune system (immunosuppressant medicines).  Exposure to radiation.  Severe infections. What increases the risk? You are more likely to develop this condition if:  You are 69 years old.  You are female.  You have a family history of a blood or bone marrow disease.  You have certain conditions, such as: ? Alcohol use disorder. ? HIV (human immunodeficiency virus) or AIDS (acquired immunodeficiency syndrome). ? Cancer. ? Conditions in which the  body's disease-fighting system attacks normal tissues (autoimmune diseases). What are the signs or symptoms? Symptoms of this condition vary depending on the cause and may include:  Anemia.  Weakness.  Shortness of breath.  Unusual bruising and bleeding.  Frequent infections.  Fatigue.  Fever.  Pale skin.  Bone pain.  Night sweats.  Weight loss.  Headache.  Dizziness.  Feeling unusually cold. How is this diagnosed? This condition may be diagnosed based on:  Your symptoms.  Your medical history.  A physical exam.  Tests. These may include: ? Removal of a sample of bone marrow to be examined under a microscope (biopsy). This is done by inserting a needle into a bone to remove fluid and cells (aspiration). ? A complete blood count (CBC). This is a group of tests that measures characteristics of WBCs, RBCs, and platelets. ? A peripheral blood smear. This test examines your blood under a microscope to provide information about drugs and diseases that affect RBCs, WBCs, and platelets. ? Reticulocyte count. This is a test that measures the amount of new or immature RBCs (reticulocytes) that are made by your bone marrow. ? Imaging studies of your spleen or liver, such as X-rays. ? A test to measure your vitamin B12 level. ? Tests for viruses. How is this treated? Treatment for this condition depends on the cause. Treatment may include:  Immunosuppressant medicines.  Antibiotic medicine.  Vitamin B12. This may be given as a treatment for megaloblastic anemia.  Medicines that help the bone marrow make blood cells (bone marrow stimulating drugs).  A bone marrow transplant.  Receiving donated blood through an IV (  blood transfusion).  A procedure to remove your spleen (splenectomy). This may be done as a treatment for hypersplenism. Follow these instructions at home: Caring for your body      Wash your hands often with soap and water. If soap and water are not  available, use hand sanitizer.  Brush your teeth twice a day, and floss at least once a day. It is recommended that you visit the dentist every 6 months.  Stay up to date on your vaccinations, including a yearly (annual) flu shot. Ask your health care provider which vaccines you should get. These may include a pneumonia vaccine. Medicines  Take over-the-counter and prescription medicines only as told by your health care provider.  If you were prescribed an antibiotic medicine, take it as told by your health care provider. Do not stop taking the antibiotic even if you start to feel better. Lifestyle  Do not participate in contact sports or dangerous activities. Ask your health care provider what activities are safe for you.  During cold and flu season, avoid crowded places and avoid contact with people who are sick. Flu season is typically between the months of October and May. General instructions  Work with your health care provider to manage your condition and educate yourself about your condition.  Follow food safety recommendations as told by your health care provider.  Keep all follow-up visits as told by your health care provider. This is important. Contact a health care provider if you:  Have a fever.  Bruise or bleed easily.  Are dizzy.  Feel unusually weak or tired. Get help right away if you have:  Bleeding that does not stop.  Wheezing or shortness of breath.  Chest pain. These symptoms may represent a serious problem that is an emergency. Do not wait to see if the symptoms will go away. Get medical help right away. Call your local emergency services (911 in the [place] Do not drive yourself to the hospital. Summary  Pancytopenia is a condition in which a person has an abnormally low amount of red blood cells, white blood cells, and platelets.  There are many possible causes of this condition.  Treatment for this condition depends on the cause.  Stay up to  date on your vaccinations.  Do not participate in contact sports or dangerous activities. Ask your health care provider what activities are safe for you. This information is not intended to replace advice given to you by your health care provider. Make sure you discuss any questions you have with your health care provider. Document Revised: 11/16/2017 Document Reviewed: 11/16/2017 Elsevier Patient Education  2020 [employer].

## 2019-12-08 NOTE — Progress Notes (Signed)
Plt 55. Dr. Julien Nordmann notified. Per Dr. Julien Nordmann, hold treatment today(12/08/19) and tomorrow (12/09/19). Patient is to resume treatment as scheduled on 12/15/19. Patient made aware and verbalized understanding. Information given on low platelet count.

## 2019-12-09 ENCOUNTER — Inpatient Hospital Stay: Payer: Self-pay

## 2019-12-15 ENCOUNTER — Other Ambulatory Visit: Payer: Self-pay | Admitting: Medical Oncology

## 2019-12-15 ENCOUNTER — Inpatient Hospital Stay: Payer: Self-pay

## 2019-12-15 ENCOUNTER — Telehealth: Payer: Self-pay | Admitting: Medical Oncology

## 2019-12-15 ENCOUNTER — Other Ambulatory Visit: Payer: Self-pay

## 2019-12-15 VITALS — BP 152/53 | HR 65 | Temp 98.6°F | Resp 18 | Wt 192.4 lb

## 2019-12-15 DIAGNOSIS — C9000 Multiple myeloma not having achieved remission: Secondary | ICD-10-CM

## 2019-12-15 DIAGNOSIS — Z95828 Presence of other vascular implants and grafts: Secondary | ICD-10-CM

## 2019-12-15 LAB — CBC WITH DIFFERENTIAL (CANCER CENTER ONLY)
Abs Immature Granulocytes: 0 10*3/uL (ref 0.00–0.07)
Basophils Absolute: 0 10*3/uL (ref 0.0–0.1)
Basophils Relative: 1 %
Eosinophils Absolute: 0 10*3/uL (ref 0.0–0.5)
Eosinophils Relative: 1 %
HCT: 35.4 % — ABNORMAL LOW (ref 36.0–46.0)
Hemoglobin: 11.7 g/dL — ABNORMAL LOW (ref 12.0–15.0)
Immature Granulocytes: 0 %
Lymphocytes Relative: 13 %
Lymphs Abs: 0.4 10*3/uL — ABNORMAL LOW (ref 0.7–4.0)
MCH: 32.7 pg (ref 26.0–34.0)
MCHC: 33.1 g/dL (ref 30.0–36.0)
MCV: 98.9 fL (ref 80.0–100.0)
Monocytes Absolute: 0.1 10*3/uL (ref 0.1–1.0)
Monocytes Relative: 2 %
Neutro Abs: 2.3 10*3/uL (ref 1.7–7.7)
Neutrophils Relative %: 83 %
Platelet Count: 128 10*3/uL — ABNORMAL LOW (ref 150–400)
RBC: 3.58 MIL/uL — ABNORMAL LOW (ref 3.87–5.11)
RDW: 14.1 % (ref 11.5–15.5)
WBC Count: 2.7 10*3/uL — ABNORMAL LOW (ref 4.0–10.5)
nRBC: 0 % (ref 0.0–0.2)

## 2019-12-15 LAB — CMP (CANCER CENTER ONLY)
ALT: 6 U/L (ref 0–44)
AST: 14 U/L — ABNORMAL LOW (ref 15–41)
Albumin: 3.7 g/dL (ref 3.5–5.0)
Alkaline Phosphatase: 62 U/L (ref 38–126)
Anion gap: 7 (ref 5–15)
BUN: 15 mg/dL (ref 8–23)
CO2: 24 mmol/L (ref 22–32)
Calcium: 9 mg/dL (ref 8.9–10.3)
Chloride: 109 mmol/L (ref 98–111)
Creatinine: 1.2 mg/dL — ABNORMAL HIGH (ref 0.44–1.00)
GFR, Estimated: 49 mL/min — ABNORMAL LOW (ref >60)
Glucose, Bld: 108 mg/dL — ABNORMAL HIGH (ref 70–99)
Potassium: 4.6 mmol/L (ref 3.5–5.1)
Sodium: 140 mmol/L (ref 135–145)
Total Bilirubin: 0.5 mg/dL (ref 0.3–1.2)
Total Protein: 6.3 g/dL — ABNORMAL LOW (ref 6.5–8.1)

## 2019-12-15 MED ORDER — PALONOSETRON HCL INJECTION 0.25 MG/5ML
INTRAVENOUS | Status: AC
  Filled 2019-12-15: qty 5

## 2019-12-15 MED ORDER — PALONOSETRON HCL INJECTION 0.25 MG/5ML
0.2500 mg | Freq: Once | INTRAVENOUS | Status: AC
  Administered 2019-12-15: 0.25 mg via INTRAVENOUS

## 2019-12-15 MED ORDER — SODIUM CHLORIDE 0.9 % IV SOLN
Freq: Once | INTRAVENOUS | Status: DC
  Filled 2019-12-15: qty 250

## 2019-12-15 MED ORDER — DEXTROSE 5 % IV SOLN
36.0000 mg/m2 | Freq: Once | INTRAVENOUS | Status: AC
  Administered 2019-12-15: 80 mg via INTRAVENOUS
  Filled 2019-12-15: qty 30

## 2019-12-15 MED ORDER — SODIUM CHLORIDE 0.9% FLUSH
10.0000 mL | INTRAVENOUS | Status: DC | PRN
  Administered 2019-12-15: 10 mL
  Filled 2019-12-15: qty 10

## 2019-12-15 MED ORDER — SODIUM CHLORIDE 0.9 % IV SOLN
300.0000 mg/m2 | Freq: Once | INTRAVENOUS | Status: AC
  Administered 2019-12-15: 640 mg via INTRAVENOUS
  Filled 2019-12-15: qty 32

## 2019-12-15 MED ORDER — SODIUM CHLORIDE 0.9 % IV SOLN
Freq: Once | INTRAVENOUS | Status: AC
  Filled 2019-12-15: qty 250

## 2019-12-15 MED ORDER — HEPARIN SOD (PORK) LOCK FLUSH 100 UNIT/ML IV SOLN
500.0000 [IU] | Freq: Once | INTRAVENOUS | Status: AC | PRN
  Administered 2019-12-15: 500 [IU]
  Filled 2019-12-15: qty 5

## 2019-12-15 MED FILL — DEXAMETHASONE 4 MG TABLET: 4 | 28 days supply | Qty: 40 | Fill #2

## 2019-12-15 MED FILL — METOPROLOL TARTRATE 25 MG T: 25 | 30 days supply | Qty: 30 | Fill #1

## 2019-12-15 NOTE — Progress Notes (Signed)
Confirmed tx doses w/ Cassie Heilingoetter, PA.  No changes.  Kennith Center, Pharm.D., CPP 12/15/2019@1 :27 PM

## 2019-12-15 NOTE — Telephone Encounter (Signed)
Requests refills. I cannot understand what she wants refilled . NO available translator . told her to bring in the bottles of med she needs . I called son and he said do not worry about the refills. I will take care of them.

## 2019-12-15 NOTE — Patient Instructions (Signed)

## 2019-12-15 NOTE — Patient Instructions (Signed)
[facility] Discharge Instructions for Patients Receiving Chemotherapy  Today you received the following chemotherapy agents: carfilzomib and cyclophosphamide.  To help prevent nausea and vomiting after your treatment, we encourage you to take your nausea medication as directed.   If you develop nausea and vomiting that is not controlled by your nausea medication, call the clinic.   BELOW ARE SYMPTOMS THAT SHOULD BE REPORTED IMMEDIATELY:  *FEVER GREATER THAN 100.5 F  *CHILLS WITH OR WITHOUT FEVER  NAUSEA AND VOMITING THAT IS NOT CONTROLLED WITH YOUR NAUSEA MEDICATION  *UNUSUAL SHORTNESS OF BREATH  *UNUSUAL BRUISING OR BLEEDING  TENDERNESS IN MOUTH AND THROAT WITH OR WITHOUT PRESENCE OF ULCERS  *URINARY PROBLEMS  *BOWEL PROBLEMS  UNUSUAL RASH Items with * indicate a potential emergency and should be followed up as soon as possible.  Feel free to call the clinic should you have any questions or concerns. The clinic phone number is [phone].  Please show the CHEMO ALERT CARD at check-in to the Emergency Department and triage nurse.   

## 2019-12-16 ENCOUNTER — Other Ambulatory Visit: Payer: Self-pay

## 2019-12-16 ENCOUNTER — Inpatient Hospital Stay: Payer: Self-pay

## 2019-12-16 VITALS — BP 148/77 | HR 71 | Temp 99.4°F | Resp 24

## 2019-12-16 DIAGNOSIS — C9000 Multiple myeloma not having achieved remission: Secondary | ICD-10-CM

## 2019-12-16 MED ORDER — DEXTROSE 5 % IV SOLN
36.0000 mg/m2 | Freq: Once | INTRAVENOUS | Status: DC
  Filled 2019-12-16 (×2): qty 40

## 2019-12-16 MED ORDER — SODIUM CHLORIDE 0.9% FLUSH
10.0000 mL | INTRAVENOUS | Status: DC | PRN
  Administered 2019-12-16: 10 mL
  Filled 2019-12-16: qty 10

## 2019-12-16 MED ORDER — HEPARIN SOD (PORK) LOCK FLUSH 100 UNIT/ML IV SOLN
500.0000 [IU] | Freq: Once | INTRAVENOUS | Status: AC | PRN
  Administered 2019-12-16: 500 [IU]
  Filled 2019-12-16: qty 5

## 2019-12-16 MED ORDER — SODIUM CHLORIDE 0.9 % IV SOLN
Freq: Once | INTRAVENOUS | Status: AC
  Filled 2019-12-16: qty 250

## 2019-12-16 MED ORDER — SODIUM CHLORIDE 0.9 % IV SOLN
Freq: Once | INTRAVENOUS | Status: DC
  Filled 2019-12-16: qty 250

## 2019-12-16 MED ORDER — PROCHLORPERAZINE MALEATE 10 MG PO TABS
ORAL_TABLET | ORAL | Status: AC
  Filled 2019-12-16: qty 1

## 2019-12-16 MED ORDER — PROCHLORPERAZINE MALEATE 10 MG PO TABS
10.0000 mg | ORAL_TABLET | Freq: Once | ORAL | Status: AC
  Administered 2019-12-16: 10 mg via ORAL

## 2019-12-16 NOTE — Progress Notes (Signed)
Rechecked temp, 99.6.  PA Cassie aware, ok to d/c patient today with instructions to monitor for any changes in condition/worsening of fever/chills.  Pt denies any other changes today.  Spoke with patient's son who will make sure that his mother checks her temperature at home every few hours and call back if there are any changes or issues.  Pt verbalized understanding of all instructions.

## 2019-12-16 NOTE — Patient Instructions (Signed)

## 2019-12-16 NOTE — Progress Notes (Signed)
Per Cassie Heilingoetter PA, no treatment today for increase in temperature 100.6

## 2019-12-29 ENCOUNTER — Inpatient Hospital Stay: Payer: Self-pay

## 2019-12-29 ENCOUNTER — Other Ambulatory Visit: Payer: Self-pay

## 2019-12-29 ENCOUNTER — Encounter: Payer: Self-pay | Admitting: Internal Medicine

## 2019-12-29 ENCOUNTER — Inpatient Hospital Stay: Payer: Self-pay | Attending: Internal Medicine | Admitting: Internal Medicine

## 2019-12-29 VITALS — BP 143/77 | HR 64 | Temp 97.5°F | Resp 18 | Ht 68.0 in | Wt 193.5 lb

## 2019-12-29 DIAGNOSIS — C9000 Multiple myeloma not having achieved remission: Secondary | ICD-10-CM

## 2019-12-29 DIAGNOSIS — Z79899 Other long term (current) drug therapy: Secondary | ICD-10-CM | POA: Insufficient documentation

## 2019-12-29 DIAGNOSIS — Z5111 Encounter for antineoplastic chemotherapy: Secondary | ICD-10-CM | POA: Insufficient documentation

## 2019-12-29 DIAGNOSIS — Z95828 Presence of other vascular implants and grafts: Secondary | ICD-10-CM

## 2019-12-29 LAB — CBC WITH DIFFERENTIAL (CANCER CENTER ONLY)
Abs Immature Granulocytes: 0.01 10*3/uL (ref 0.00–0.07)
Basophils Absolute: 0 10*3/uL (ref 0.0–0.1)
Basophils Relative: 1 %
Eosinophils Absolute: 0.1 10*3/uL (ref 0.0–0.5)
Eosinophils Relative: 2 %
HCT: 32.4 % — ABNORMAL LOW (ref 36.0–46.0)
Hemoglobin: 10.9 g/dL — ABNORMAL LOW (ref 12.0–15.0)
Immature Granulocytes: 0 %
Lymphocytes Relative: 17 %
Lymphs Abs: 0.5 10*3/uL — ABNORMAL LOW (ref 0.7–4.0)
MCH: 32.2 pg (ref 26.0–34.0)
MCHC: 33.6 g/dL (ref 30.0–36.0)
MCV: 95.6 fL (ref 80.0–100.0)
Monocytes Absolute: 0.1 10*3/uL (ref 0.1–1.0)
Monocytes Relative: 4 %
Neutro Abs: 2.4 10*3/uL (ref 1.7–7.7)
Neutrophils Relative %: 76 %
Platelet Count: 133 10*3/uL — ABNORMAL LOW (ref 150–400)
RBC: 3.39 MIL/uL — ABNORMAL LOW (ref 3.87–5.11)
RDW: 13.5 % (ref 11.5–15.5)
WBC Count: 3.1 10*3/uL — ABNORMAL LOW (ref 4.0–10.5)
nRBC: 0 % (ref 0.0–0.2)

## 2019-12-29 LAB — CMP (CANCER CENTER ONLY)
ALT: 10 U/L (ref 0–44)
AST: 13 U/L — ABNORMAL LOW (ref 15–41)
Albumin: 3.7 g/dL (ref 3.5–5.0)
Alkaline Phosphatase: 56 U/L (ref 38–126)
Anion gap: 7 (ref 5–15)
BUN: 12 mg/dL (ref 8–23)
CO2: 25 mmol/L (ref 22–32)
Calcium: 8.9 mg/dL (ref 8.9–10.3)
Chloride: 108 mmol/L (ref 98–111)
Creatinine: 1.29 mg/dL — ABNORMAL HIGH (ref 0.44–1.00)
GFR, Estimated: 45 mL/min — ABNORMAL LOW (ref >60)
Glucose, Bld: 124 mg/dL — ABNORMAL HIGH (ref 70–99)
Potassium: 4.8 mmol/L (ref 3.5–5.1)
Sodium: 140 mmol/L (ref 135–145)
Total Bilirubin: 0.5 mg/dL (ref 0.3–1.2)
Total Protein: 6.2 g/dL — ABNORMAL LOW (ref 6.5–8.1)

## 2019-12-29 MED ORDER — DEXTROSE 5 % IV SOLN
36.0000 mg/m2 | Freq: Once | INTRAVENOUS | Status: AC
  Administered 2019-12-29: 80 mg via INTRAVENOUS
  Filled 2019-12-29: qty 30

## 2019-12-29 MED ORDER — PALONOSETRON HCL INJECTION 0.25 MG/5ML
0.2500 mg | Freq: Once | INTRAVENOUS | Status: AC
  Administered 2019-12-29: 0.25 mg via INTRAVENOUS

## 2019-12-29 MED ORDER — SODIUM CHLORIDE 0.9 % IV SOLN
Freq: Once | INTRAVENOUS | Status: AC
  Filled 2019-12-29: qty 250

## 2019-12-29 MED ORDER — PALONOSETRON HCL INJECTION 0.25 MG/5ML
INTRAVENOUS | Status: AC
  Filled 2019-12-29: qty 5

## 2019-12-29 MED ORDER — SODIUM CHLORIDE 0.9 % IV SOLN
300.0000 mg/m2 | Freq: Once | INTRAVENOUS | Status: AC
  Administered 2019-12-29: 640 mg via INTRAVENOUS
  Filled 2019-12-29: qty 32

## 2019-12-29 MED ORDER — SODIUM CHLORIDE 0.9% FLUSH
10.0000 mL | INTRAVENOUS | Status: DC | PRN
  Administered 2019-12-29: 10 mL
  Filled 2019-12-29: qty 10

## 2019-12-29 MED ORDER — HEPARIN SOD (PORK) LOCK FLUSH 100 UNIT/ML IV SOLN
500.0000 [IU] | Freq: Once | INTRAVENOUS | Status: AC | PRN
  Administered 2019-12-29: 500 [IU]
  Filled 2019-12-29: qty 5

## 2019-12-29 NOTE — Patient Instructions (Signed)
[facility] Discharge Instructions for Patients Receiving Chemotherapy  Today you received the following chemotherapy agents: Kyprolis/Cytoxan.  To help prevent nausea and vomiting after your treatment, we encourage you to take your nausea medication as directed.   If you develop nausea and vomiting that is not controlled by your nausea medication, call the clinic.   BELOW ARE SYMPTOMS THAT SHOULD BE REPORTED IMMEDIATELY:  *FEVER GREATER THAN 100.5 F  *CHILLS WITH OR WITHOUT FEVER  NAUSEA AND VOMITING THAT IS NOT CONTROLLED WITH YOUR NAUSEA MEDICATION  *UNUSUAL SHORTNESS OF BREATH  *UNUSUAL BRUISING OR BLEEDING  TENDERNESS IN MOUTH AND THROAT WITH OR WITHOUT PRESENCE OF ULCERS  *URINARY PROBLEMS  *BOWEL PROBLEMS  UNUSUAL RASH Items with * indicate a potential emergency and should be followed up as soon as possible.  Feel free to call the clinic should you have any questions or concerns. The clinic phone number is [phone].  Please show the CHEMO ALERT CARD at check-in to the Emergency Department and triage nurse.   

## 2019-12-29 NOTE — Progress Notes (Signed)
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  PRIOR THERAPY: 1)Systemic therapy with Velcade 1.3 mg/M2 weekly, Revlimid 25 mg p.o. daily for 14 days every 3 weeks in addition to weekly Decadron 40 mg orally. First dose of treatment 10/08/2017.Treatment was placed on hold due to development of a significant rash.Sheresumedtreatment withonlydexamethasone and Velcade on 11/19/2017.This was discontinued on 04/14/2019 due to evidence of disease progression. 2)Pomalyst 4 mg p.o. for 21 days every 4 weeks and 40 mg p.o. Decadron once a week. First dose3/06/2019-05/04/2019.Status post 6 days of treatment. Discontinued secondary to allergy to Pomalyst.  CURRENT THERAPY: Chemotherapy with carfilzomibon days 1, 2, 8, 9, 15, and 16and Cytoxan 300 mg/m2 on days 1,8, and 15 IV every 4 weeks and 40 mg p.o. weekly of Decadron. First dose expected on 05/12/2019. Status post 8 cycles.  INTERVAL HISTORY: Icess Bertoni 69 y.o. female returns to the clinic today for follow-up visit.  The patient is feeling much better today with less pain in the back and lower extremities.  She denied having any chest pain, shortness of breath, cough or hemoptysis.  She has no nausea, vomiting, diarrhea or constipation.  She has no headache or visual changes.  She has no bleeding issues.  She continues to tolerate her systemic chemotherapy fairly well.  The patient is here today for evaluation before starting cycle #9 of her treatment.  MEDICAL HISTORY: Past Medical History:  Diagnosis Date  . Cancer ([place])     ALLERGIES:  has No Known Allergies.  MEDICATIONS:  Current Outpatient Medications  Medication Sig Dispense Refill  . acyclovir (ZOVIRAX) 200 MG capsule Take 1 capsule (200 mg total) by mouth 2 (two) times daily. 60 capsule 4   . blood glucose meter kit and supplies Dispense based on patient and insurance preference. Use up to four times daily as directed. (FOR ICD-10 E10.9, E11.9). (Patient not taking: Reported on 09/01/2019) 1 each 0  . dexamethasone (DECADRON) 4 MG tablet Take 10 tablets (40 mg total) by mouth every 7 (seven) days. 40 tablet 3  . diphenhydrAMINE (BENADRYL ALLERGY) 25 MG tablet Take 1 tablet (25 mg total) by mouth every 6 (six) hours as needed for itching. (Patient not taking: Reported on 09/01/2019) 100 tablet 0  . furosemide (LASIX) 40 MG tablet Take 40 mg by mouth daily as needed.    . lidocaine-prilocaine (EMLA) cream Apply 1 application topically as needed. 30 g 2  . metoprolol tartrate (LOPRESSOR) 25 MG tablet TAKE 1/2 TABLET BY MOUTH TWICE DAILY 30 tablet 3  . omeprazole (PRILOSEC) 20 MG capsule Take 1 capsule (20 mg total) by mouth daily. 30 capsule 4  . oxyCODONE-acetaminophen (PERCOCET) 5-325 MG tablet Take 2 tablets by mouth every 6 (six) hours as needed. (Patient not taking: Reported on 09/01/2019) 20 tablet 0  . prochlorperazine (COMPAZINE) 10 MG tablet Take 1 tablet (10 mg total) by mouth every 6 (six) hours as needed for nausea or vomiting. (Patient not taking: Reported on 09/01/2019) 30 tablet 2  . RELION GLUCOSE TEST STRIPS test strip USE 1 TO CHECK GLUCOSE ONCE DAILY (Patient not taking: Reported on 09/01/2019)    . ReliOn Ultra Thin Lancets 30G MISC USE 1 TO CHECK GLUCOSE ONCE DAILY FOR GLUCOSE TESTING (Patient not taking: Reported on 09/01/2019)    . traMADol (ULTRAM) 50 MG tablet Take 50  mg by mouth every 8 (eight) hours as needed. (Patient not taking: Reported on 09/01/2019)     No current facility-administered medications for this visit.   Facility-Administered Medications Ordered in Other Visits  Medication Dose Route Frequency Provider Last Rate Last Admin  . sodium chloride flush (NS) 0.9 % injection 10 mL  10 mL Intracatheter PRN Curt Bears, MD   10 mL at 12/29/19 1202    SURGICAL  HISTORY:  Past Surgical History:  Procedure Laterality Date  . IR IMAGING GUIDED PORT INSERTION  05/10/2019    REVIEW OF SYSTEMS:  A comprehensive review of systems was negative except for: Constitutional: positive for fatigue Neurological: positive for paresthesia   PHYSICAL EXAMINATION: General appearance: alert, cooperative, fatigued and no distress Head: Normocephalic, without obvious abnormality, atraumatic Neck: no adenopathy, no JVD, supple, symmetrical, trachea midline and thyroid not enlarged, symmetric, no tenderness/mass/nodules Lymph nodes: Cervical, supraclavicular, and axillary nodes normal. Resp: clear to auscultation bilaterally Back: symmetric, no curvature. ROM normal. No CVA tenderness. Cardio: regular rate and rhythm, S1, S2 normal, no murmur, click, rub or gallop GI: soft, non-tender; bowel sounds normal; no masses,  no organomegaly Extremities: extremities normal, atraumatic, no cyanosis or edema  ECOG PERFORMANCE STATUS: 1 - Symptomatic but completely ambulatory  Blood pressure (!) 143/77, pulse 64, temperature (!) 97.5 F (36.4 C), resp. rate 18, height '5\' 8"'  (1.727 m), weight 193 lb 8 oz (87.8 kg), SpO2 100 %.  LABORATORY DATA: Lab Results  Component Value Date   WBC 3.1 (L) 12/29/2019   HGB 10.9 (L) 12/29/2019   HCT 32.4 (L) 12/29/2019   MCV 95.6 12/29/2019   PLT 133 (L) 12/29/2019      Chemistry      Component Value Date/Time   NA 140 12/15/2019 1158   K 4.6 12/15/2019 1158   CL 109 12/15/2019 1158   CO2 24 12/15/2019 1158   BUN 15 12/15/2019 1158   CREATININE 1.20 (H) 12/15/2019 1158      Component Value Date/Time   CALCIUM 9.0 12/15/2019 1158   ALKPHOS 62 12/15/2019 1158   AST 14 (L) 12/15/2019 1158   ALT 6 12/15/2019 1158   BILITOT 0.5 12/15/2019 1158       RADIOGRAPHIC STUDIES: DG Hip Unilat W or W/O Pelvis 1 View Right  Result Date: 12/02/2019 CLINICAL DATA:  Multiple myeloma with complaints of right hip pain. EXAM: DG HIP (WITH  OR WITHOUT PELVIS) 1V RIGHT COMPARISON:  12/17/2017 FINDINGS: There are old healed fractures of the left inferior and superior pubic rami. With there is no definite acute displaced fracture or dislocation involving the right hip. There is osteopenia. Minimal degenerative changes are noted of both hips. IMPRESSION: No acute displaced fracture or dislocation involving the right hip. Electronically Signed   By: Constance Holster M.D.   On: 12/02/2019 00:01    ASSESSMENT AND PLAN: This is a very pleasant 69 years old African female originally from [place] who is visiting her son in [place] and was recently diagnosed with IgA multiple myeloma. The patient was a started initially on treatment with weekly subcutaneous Velcade 1.3 mg/M2, Revlimid 25 mg p.o. daily for 21 days every 4 weeks as well as weekly Decadron 40 mg orally.  Revlimid was discontinued secondary to hypersensitivity reaction with significant skin rash.  She is currently on treatment with weekly Velcade and Decadron. Her treatment was discontinued secondary to disease progression.  The patient started treatment with Pomalyst and Decadron but this was discontinued secondary to  hypersensitivity reaction to the Pomalyst. She is currently undergoing systemic chemotherapy with carfilzomib, Cytoxan and Decadron status post 7 cycles. The patient continues to tolerate her treatment well with no concerning adverse effects. I recommended for her to proceed with cycle #8 today as planned. She will come back for follow-up visit in 4 weeks for evaluation before the next cycle of her treatment. The patient was advised to call immediately if she has any concerning symptoms in the interval. The patient voices understanding of current disease status and treatment options and is in agreement with the current care plan. All questions were answered. The patient knows to call the clinic with any problems, questions or concerns. We can certainly  see the patient much sooner if necessary.  Disclaimer: This note was dictated with voice recognition software. Similar sounding words can inadvertently be transcribed and may not be corrected upon review.

## 2019-12-30 ENCOUNTER — Inpatient Hospital Stay: Payer: Self-pay

## 2019-12-30 ENCOUNTER — Other Ambulatory Visit: Payer: Self-pay

## 2019-12-30 VITALS — BP 158/87 | HR 63 | Temp 98.6°F | Resp 18

## 2019-12-30 DIAGNOSIS — C9000 Multiple myeloma not having achieved remission: Secondary | ICD-10-CM

## 2019-12-30 MED ORDER — SODIUM CHLORIDE 0.9 % IV SOLN
Freq: Once | INTRAVENOUS | Status: AC
  Filled 2019-12-30: qty 250

## 2019-12-30 MED ORDER — HEPARIN SOD (PORK) LOCK FLUSH 100 UNIT/ML IV SOLN
500.0000 [IU] | Freq: Once | INTRAVENOUS | Status: AC | PRN
  Administered 2019-12-30: 500 [IU]
  Filled 2019-12-30: qty 5

## 2019-12-30 MED ORDER — SODIUM CHLORIDE 0.9% FLUSH
10.0000 mL | INTRAVENOUS | Status: DC | PRN
  Administered 2019-12-30: 10 mL
  Filled 2019-12-30: qty 10

## 2019-12-30 MED ORDER — PROCHLORPERAZINE MALEATE 10 MG PO TABS
10.0000 mg | ORAL_TABLET | Freq: Once | ORAL | Status: AC
  Administered 2019-12-30: 10 mg via ORAL

## 2019-12-30 MED ORDER — DEXTROSE 5 % IV SOLN
36.0000 mg/m2 | Freq: Once | INTRAVENOUS | Status: AC
  Administered 2019-12-30: 80 mg via INTRAVENOUS
  Filled 2019-12-30: qty 30

## 2019-12-30 MED ORDER — PROCHLORPERAZINE MALEATE 10 MG PO TABS
ORAL_TABLET | ORAL | Status: AC
  Filled 2019-12-30: qty 1

## 2019-12-30 NOTE — Patient Instructions (Signed)
[facility] Discharge Instructions for Patients Receiving Chemotherapy  Today you received the following chemotherapy agents: carfilzomib.  To help prevent nausea and vomiting after your treatment, we encourage you to take your nausea medication as directed.   If you develop nausea and vomiting that is not controlled by your nausea medication, call the clinic.   BELOW ARE SYMPTOMS THAT SHOULD BE REPORTED IMMEDIATELY:  *FEVER GREATER THAN 100.5 F  *CHILLS WITH OR WITHOUT FEVER  NAUSEA AND VOMITING THAT IS NOT CONTROLLED WITH YOUR NAUSEA MEDICATION  *UNUSUAL SHORTNESS OF BREATH  *UNUSUAL BRUISING OR BLEEDING  TENDERNESS IN MOUTH AND THROAT WITH OR WITHOUT PRESENCE OF ULCERS  *URINARY PROBLEMS  *BOWEL PROBLEMS  UNUSUAL RASH Items with * indicate a potential emergency and should be followed up as soon as possible.  Feel free to call the clinic should you have any questions or concerns. The clinic phone number is [phone].  Please show the CHEMO ALERT CARD at check-in to the Emergency Department and triage nurse.   

## 2020-01-05 ENCOUNTER — Other Ambulatory Visit: Payer: Self-pay | Admitting: Medical Oncology

## 2020-01-05 ENCOUNTER — Inpatient Hospital Stay: Payer: Self-pay

## 2020-01-05 ENCOUNTER — Other Ambulatory Visit: Payer: Self-pay

## 2020-01-05 DIAGNOSIS — C9000 Multiple myeloma not having achieved remission: Secondary | ICD-10-CM

## 2020-01-05 DIAGNOSIS — T7840XA Allergy, unspecified, initial encounter: Secondary | ICD-10-CM

## 2020-01-05 DIAGNOSIS — Z95828 Presence of other vascular implants and grafts: Secondary | ICD-10-CM

## 2020-01-05 LAB — CBC WITH DIFFERENTIAL (CANCER CENTER ONLY)
Abs Immature Granulocytes: 0.03 10*3/uL (ref 0.00–0.07)
Basophils Absolute: 0 10*3/uL (ref 0.0–0.1)
Basophils Relative: 0 %
Eosinophils Absolute: 0 10*3/uL (ref 0.0–0.5)
Eosinophils Relative: 0 %
HCT: 31.3 % — ABNORMAL LOW (ref 36.0–46.0)
Hemoglobin: 10.4 g/dL — ABNORMAL LOW (ref 12.0–15.0)
Immature Granulocytes: 1 %
Lymphocytes Relative: 9 %
Lymphs Abs: 0.3 10*3/uL — ABNORMAL LOW (ref 0.7–4.0)
MCH: 32.1 pg (ref 26.0–34.0)
MCHC: 33.2 g/dL (ref 30.0–36.0)
MCV: 96.6 fL (ref 80.0–100.0)
Monocytes Absolute: 0.1 10*3/uL (ref 0.1–1.0)
Monocytes Relative: 4 %
Neutro Abs: 2.7 10*3/uL (ref 1.7–7.7)
Neutrophils Relative %: 86 %
Platelet Count: 44 10*3/uL — ABNORMAL LOW (ref 150–400)
RBC: 3.24 MIL/uL — ABNORMAL LOW (ref 3.87–5.11)
RDW: 14.9 % (ref 11.5–15.5)
WBC Count: 3.1 10*3/uL — ABNORMAL LOW (ref 4.0–10.5)
nRBC: 1 % — ABNORMAL HIGH (ref 0.0–0.2)

## 2020-01-05 LAB — CMP (CANCER CENTER ONLY)
ALT: 12 U/L (ref 0–44)
AST: 19 U/L (ref 15–41)
Albumin: 3.8 g/dL (ref 3.5–5.0)
Alkaline Phosphatase: 65 U/L (ref 38–126)
Anion gap: 9 (ref 5–15)
BUN: 19 mg/dL (ref 8–23)
CO2: 23 mmol/L (ref 22–32)
Calcium: 9 mg/dL (ref 8.9–10.3)
Chloride: 110 mmol/L (ref 98–111)
Creatinine: 1.32 mg/dL — ABNORMAL HIGH (ref 0.44–1.00)
GFR, Estimated: 44 mL/min — ABNORMAL LOW (ref >60)
Glucose, Bld: 124 mg/dL — ABNORMAL HIGH (ref 70–99)
Potassium: 4.7 mmol/L (ref 3.5–5.1)
Sodium: 142 mmol/L (ref 135–145)
Total Bilirubin: 0.6 mg/dL (ref 0.3–1.2)
Total Protein: 6.5 g/dL (ref 6.5–8.1)

## 2020-01-05 MED ORDER — LIDOCAINE-PRILOCAINE 2.5-2.5 % EX CREA: 1.0000 "application " | TOPICAL_CREAM | CUTANEOUS | 2 refills | Status: DC | PRN

## 2020-01-05 MED ORDER — SODIUM CHLORIDE 0.9% FLUSH
10.0000 mL | INTRAVENOUS | Status: DC | PRN
  Administered 2020-01-05: 10 mL
  Filled 2020-01-05: qty 10

## 2020-01-05 MED ORDER — PROCHLORPERAZINE MALEATE 10 MG PO TABS: 10.0000 mg | ORAL_TABLET | Freq: Four times a day (QID) | ORAL | 2 refills | Status: DC | PRN

## 2020-01-05 MED ORDER — HEPARIN SOD (PORK) LOCK FLUSH 100 UNIT/ML IV SOLN
500.0000 [IU] | INTRAVENOUS | Status: AC | PRN
  Administered 2020-01-05: 500 [IU]
  Filled 2020-01-05: qty 5

## 2020-01-05 MED ORDER — SODIUM CHLORIDE 0.9% FLUSH
10.0000 mL | INTRAVENOUS | Status: AC | PRN
  Administered 2020-01-05: 10 mL
  Filled 2020-01-05: qty 10

## 2020-01-05 MED ORDER — DEXAMETHASONE 4 MG PO TABS: 40.0000 mg | ORAL_TABLET | ORAL | 3 refills | Status: DC

## 2020-01-05 MED FILL — DEXAMETHASONE 4 MG TABLET: 4 | 28 days supply | Qty: 40 | Fill #0

## 2020-01-05 MED FILL — OMEPRAZOLE 20 MG CAP: 20 | 30 days supply | Qty: 30 | Fill #1

## 2020-01-05 MED FILL — LIDOCAINE-PRILOCAINE CREAM: 2.5-2.5 | 30 days supply | Qty: 30 | Fill #0

## 2020-01-05 NOTE — Progress Notes (Signed)
Per Dr. Julien Nordmann no treatment this week due to low platelets.  Patient instructed to come next.  Patient verbalized understanding.  Patient also instructed to get miralax for constipation and verbalized understanding.

## 2020-01-06 ENCOUNTER — Inpatient Hospital Stay: Payer: Self-pay

## 2020-01-06 MED FILL — ACYCLOVIR 200 MG CAP: 200 | 30 days supply | Qty: 60 | Fill #1

## 2020-01-12 ENCOUNTER — Inpatient Hospital Stay: Payer: Self-pay

## 2020-01-12 ENCOUNTER — Other Ambulatory Visit: Payer: Self-pay

## 2020-01-12 VITALS — BP 152/74 | HR 60 | Temp 97.8°F | Resp 18

## 2020-01-12 DIAGNOSIS — Z95828 Presence of other vascular implants and grafts: Secondary | ICD-10-CM

## 2020-01-12 DIAGNOSIS — C9000 Multiple myeloma not having achieved remission: Secondary | ICD-10-CM

## 2020-01-12 LAB — CMP (CANCER CENTER ONLY)
ALT: 9 U/L (ref 0–44)
AST: 15 U/L (ref 15–41)
Albumin: 3.6 g/dL (ref 3.5–5.0)
Alkaline Phosphatase: 60 U/L (ref 38–126)
Anion gap: 8 (ref 5–15)
BUN: 14 mg/dL (ref 8–23)
CO2: 24 mmol/L (ref 22–32)
Calcium: 8.7 mg/dL — ABNORMAL LOW (ref 8.9–10.3)
Chloride: 110 mmol/L (ref 98–111)
Creatinine: 1.33 mg/dL — ABNORMAL HIGH (ref 0.44–1.00)
GFR, Estimated: 43 mL/min — ABNORMAL LOW (ref >60)
Glucose, Bld: 112 mg/dL — ABNORMAL HIGH (ref 70–99)
Potassium: 4.7 mmol/L (ref 3.5–5.1)
Sodium: 142 mmol/L (ref 135–145)
Total Bilirubin: 0.5 mg/dL (ref 0.3–1.2)
Total Protein: 6 g/dL — ABNORMAL LOW (ref 6.5–8.1)

## 2020-01-12 LAB — CBC WITH DIFFERENTIAL (CANCER CENTER ONLY)
Abs Immature Granulocytes: 0.01 10*3/uL (ref 0.00–0.07)
Basophils Absolute: 0 10*3/uL (ref 0.0–0.1)
Basophils Relative: 1 %
Eosinophils Absolute: 0.2 10*3/uL (ref 0.0–0.5)
Eosinophils Relative: 7 %
HCT: 32.8 % — ABNORMAL LOW (ref 36.0–46.0)
Hemoglobin: 10.8 g/dL — ABNORMAL LOW (ref 12.0–15.0)
Immature Granulocytes: 0 %
Lymphocytes Relative: 20 %
Lymphs Abs: 0.5 10*3/uL — ABNORMAL LOW (ref 0.7–4.0)
MCH: 32.4 pg (ref 26.0–34.0)
MCHC: 32.9 g/dL (ref 30.0–36.0)
MCV: 98.5 fL (ref 80.0–100.0)
Monocytes Absolute: 0.2 10*3/uL (ref 0.1–1.0)
Monocytes Relative: 7 %
Neutro Abs: 1.5 10*3/uL — ABNORMAL LOW (ref 1.7–7.7)
Neutrophils Relative %: 65 %
Platelet Count: 107 10*3/uL — ABNORMAL LOW (ref 150–400)
RBC: 3.33 MIL/uL — ABNORMAL LOW (ref 3.87–5.11)
RDW: 15.1 % (ref 11.5–15.5)
WBC Count: 2.3 10*3/uL — ABNORMAL LOW (ref 4.0–10.5)
nRBC: 0 % (ref 0.0–0.2)

## 2020-01-12 MED ORDER — SODIUM CHLORIDE 0.9% FLUSH
10.0000 mL | INTRAVENOUS | Status: DC | PRN
  Administered 2020-01-12: 10 mL
  Filled 2020-01-12: qty 10

## 2020-01-12 MED ORDER — PALONOSETRON HCL INJECTION 0.25 MG/5ML
0.2500 mg | Freq: Once | INTRAVENOUS | Status: AC
  Administered 2020-01-12: 0.25 mg via INTRAVENOUS

## 2020-01-12 MED ORDER — PALONOSETRON HCL INJECTION 0.25 MG/5ML
INTRAVENOUS | Status: AC
  Filled 2020-01-12: qty 5

## 2020-01-12 MED ORDER — SODIUM CHLORIDE 0.9 % IV SOLN
Freq: Once | INTRAVENOUS | Status: AC
  Filled 2020-01-12: qty 250

## 2020-01-12 MED ORDER — SODIUM CHLORIDE 0.9 % IV SOLN
300.0000 mg/m2 | Freq: Once | INTRAVENOUS | Status: AC
  Administered 2020-01-12: 640 mg via INTRAVENOUS
  Filled 2020-01-12: qty 32

## 2020-01-12 MED ORDER — HEPARIN SOD (PORK) LOCK FLUSH 100 UNIT/ML IV SOLN
500.0000 [IU] | Freq: Once | INTRAVENOUS | Status: AC | PRN
  Administered 2020-01-12: 500 [IU]
  Filled 2020-01-12: qty 5

## 2020-01-12 MED ORDER — DEXTROSE 5 % IV SOLN
36.0000 mg/m2 | Freq: Once | INTRAVENOUS | Status: AC
  Administered 2020-01-12: 80 mg via INTRAVENOUS
  Filled 2020-01-12: qty 30

## 2020-01-12 NOTE — Patient Instructions (Signed)
[facility] Discharge Instructions for Patients Receiving Chemotherapy  Today you received the following chemotherapy agents: Kyprolis/Cytoxan.  To help prevent nausea and vomiting after your treatment, we encourage you to take your nausea medication as directed.   If you develop nausea and vomiting that is not controlled by your nausea medication, call the clinic.   BELOW ARE SYMPTOMS THAT SHOULD BE REPORTED IMMEDIATELY:  *FEVER GREATER THAN 100.5 F  *CHILLS WITH OR WITHOUT FEVER  NAUSEA AND VOMITING THAT IS NOT CONTROLLED WITH YOUR NAUSEA MEDICATION  *UNUSUAL SHORTNESS OF BREATH  *UNUSUAL BRUISING OR BLEEDING  TENDERNESS IN MOUTH AND THROAT WITH OR WITHOUT PRESENCE OF ULCERS  *URINARY PROBLEMS  *BOWEL PROBLEMS  UNUSUAL RASH Items with * indicate a potential emergency and should be followed up as soon as possible.  Feel free to call the clinic should you have any questions or concerns. The clinic phone number is [phone].  Please show the CHEMO ALERT CARD at check-in to the Emergency Department and triage nurse.   

## 2020-01-13 ENCOUNTER — Other Ambulatory Visit: Payer: Self-pay

## 2020-01-13 ENCOUNTER — Inpatient Hospital Stay: Payer: Self-pay

## 2020-01-13 VITALS — BP 161/90 | HR 70 | Temp 98.4°F | Resp 18

## 2020-01-13 DIAGNOSIS — C9000 Multiple myeloma not having achieved remission: Secondary | ICD-10-CM

## 2020-01-13 MED ORDER — DEXTROSE 5 % IV SOLN
36.0000 mg/m2 | Freq: Once | INTRAVENOUS | Status: AC
  Administered 2020-01-13: 80 mg via INTRAVENOUS
  Filled 2020-01-13: qty 30

## 2020-01-13 MED ORDER — SODIUM CHLORIDE 0.9 % IV SOLN
Freq: Once | INTRAVENOUS | Status: DC
  Filled 2020-01-13: qty 250

## 2020-01-13 MED ORDER — HEPARIN SOD (PORK) LOCK FLUSH 100 UNIT/ML IV SOLN
500.0000 [IU] | Freq: Once | INTRAVENOUS | Status: AC | PRN
  Administered 2020-01-13: 500 [IU]
  Filled 2020-01-13: qty 5

## 2020-01-13 MED ORDER — PROCHLORPERAZINE MALEATE 10 MG PO TABS
10.0000 mg | ORAL_TABLET | Freq: Once | ORAL | Status: AC
  Administered 2020-01-13: 10 mg via ORAL

## 2020-01-13 MED ORDER — PROCHLORPERAZINE MALEATE 10 MG PO TABS
ORAL_TABLET | ORAL | Status: AC
  Filled 2020-01-13: qty 1

## 2020-01-13 MED ORDER — SODIUM CHLORIDE 0.9% FLUSH
10.0000 mL | INTRAVENOUS | Status: DC | PRN
  Administered 2020-01-13: 10 mL
  Filled 2020-01-13: qty 10

## 2020-01-13 MED ORDER — SODIUM CHLORIDE 0.9 % IV SOLN
Freq: Once | INTRAVENOUS | Status: AC
  Filled 2020-01-13: qty 250

## 2020-01-13 NOTE — Patient Instructions (Signed)
[facility] Discharge Instructions for Patients Receiving Chemotherapy  Today you received the following chemotherapy agents:  Kyprolis  To help prevent nausea and vomiting after your treatment, we encourage you to take your nausea medication as directed.   If you develop nausea and vomiting that is not controlled by your nausea medication, call the clinic.   BELOW ARE SYMPTOMS THAT SHOULD BE REPORTED IMMEDIATELY:  *FEVER GREATER THAN 100.5 F  *CHILLS WITH OR WITHOUT FEVER  NAUSEA AND VOMITING THAT IS NOT CONTROLLED WITH YOUR NAUSEA MEDICATION  *UNUSUAL SHORTNESS OF BREATH  *UNUSUAL BRUISING OR BLEEDING  TENDERNESS IN MOUTH AND THROAT WITH OR WITHOUT PRESENCE OF ULCERS  *URINARY PROBLEMS  *BOWEL PROBLEMS  UNUSUAL RASH Items with * indicate a potential emergency and should be followed up as soon as possible.  Feel free to call the clinic should you have any questions or concerns. The clinic phone number is [phone].  Please show the CHEMO ALERT CARD at check-in to the Emergency Department and triage nurse.   

## 2020-01-23 NOTE — Progress Notes (Signed)
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  PRIOR THERAPY: 1)Systemic therapy with Velcade 1.3 mg/M2 weekly, Revlimid 25 mg p.o. daily for 14 days every 3 weeks in addition to weekly Decadron 40 mg orally. First dose of treatment 10/08/2017.Treatment was placed on hold due to development of a significant rash.Sheresumedtreatment withonlydexamethasone and Velcade on 11/19/2017.This was discontinued on 04/14/2019 due to evidence of disease progression. 2)Pomalyst 4 mg p.o. for 21 days every 4 weeks and 40 mg p.o. Decadron once a week. First dose3/06/2019-05/04/2019.Status post 6 days of treatment. Discontinued secondary to allergy to Pomalyst.  CURRENT THERAPY: Chemotherapy with carfilzomibon days 1, 2, 8, 9, 15, and 16and Cytoxan 300 mg/m2 on days 1,8, and 15 IV every 4 weeks and 40 mg p.o. weekly of Decadron. First dose expected on 05/12/2019. Status post9cycles.  INTERVAL HISTORY: Debbie Chan 69 y.o. female returns to the clinic today for a follow-up visit.  The patient is feeling fair today without any concerning complaints except for a wet cough which started on Sunday. Her son bought her cough syrup. She denies associated fevers, chills, shortness of breath, or sore throat. The patient denies any night sweats or weight loss.  Denies any nausea, vomiting, diarrhea, or constipation. She continues to report abdominal bloating without pain. Denies any headache or visual changes.  She denies any abnormal bleeding or bruising. She denies skin infections or dysuria. She reports itching on the right side of her neck without evidence for rash. 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 She was on gabapentin but it was discontinued due to extremity swelling. She did not take decadron today due to her appointments being decoupled and having treatment tomorrow. She is  here today for evaluation before starting cycle #10.  MEDICAL HISTORY: Past Medical History:  Diagnosis Date  . Cancer ([place])     ALLERGIES:  has No Known Allergies.  MEDICATIONS:  Current Outpatient Medications  Medication Sig Dispense Refill  . acyclovir (ZOVIRAX) 200 MG capsule Take 1 capsule (200 mg total) by mouth 2 (two) times daily. 60 capsule 4  . blood glucose meter kit and supplies Dispense based on patient and insurance preference. Use up to four times daily as directed. (FOR ICD-10 E10.9, E11.9). 1 each 0  . dexamethasone (DECADRON) 4 MG tablet Take 10 tablets (40 mg total) by mouth every 7 (seven) days. 40 tablet 3  . diphenhydrAMINE (BENADRYL ALLERGY) 25 MG tablet Take 1 tablet (25 mg total) by mouth every 6 (six) hours as needed for itching. 100 tablet 0  . furosemide (LASIX) 40 MG tablet Take 40 mg by mouth daily as needed.    . lidocaine-prilocaine (EMLA) cream Apply 1 application topically as needed. 30 g 2  . metoprolol tartrate (LOPRESSOR) 25 MG tablet TAKE 1/2 TABLET BY MOUTH TWICE DAILY 30 tablet 3  . omeprazole (PRILOSEC) 20 MG capsule Take 1 capsule (20 mg total) by mouth daily. 30 capsule 4  . oxyCODONE-acetaminophen (PERCOCET) 5-325 MG tablet Take 2 tablets by mouth every 6 (six) hours as needed. 20 tablet 0  . prochlorperazine (COMPAZINE) 10 MG tablet Take 1 tablet (10 mg total) by mouth every 6 (six) hours as needed for nausea or vomiting. 30 tablet 2  . RELION GLUCOSE TEST STRIPS test strip USE 1 TO CHECK GLUCOSE ONCE DAILY    . ReliOn Ultra Thin Lancets 30G MISC USE 1 TO CHECK GLUCOSE ONCE  DAILY FOR GLUCOSE TESTING    . traMADol (ULTRAM) 50 MG tablet Take 50 mg by mouth every 8 (eight) hours as needed.      No current facility-administered medications for this visit.    SURGICAL HISTORY:  Past Surgical History:  Procedure Laterality Date  . IR IMAGING GUIDED PORT INSERTION  05/10/2019    REVIEW OF SYSTEMS:   Review of Systems  Constitutional: Negative  for appetite change, chills, fatigue, fever and unexpected weight change.  HENT: Negative for mouth sores, nosebleeds, sore throat and trouble swallowing.   Eyes: Negative for eye problems and icterus.  Respiratory: Positive for cough. Negative for hemoptysis, shortness of breath and wheezing.   Cardiovascular: Negative for chest pain and leg swelling.  Gastrointestinal: Positive for abdominal bloating. Negative for abdominal pain, constipation, diarrhea, nausea and vomiting.  Genitourinary: Negative for bladder incontinence, difficulty urinating, dysuria, frequency and hematuria.   Musculoskeletal: Negative for gait problem, neck pain and neck stiffness.  Skin: Negative for itching and rash.  Neurological: Negative for dizziness, extremity weakness, gait problem, headaches, light-headedness and seizures.  Hematological: Negative for adenopathy. Does not bruise/bleed easily.  Psychiatric/Behavioral: Negative for confusion, depression and sleep disturbance. The patient is not nervous/anxious.    PHYSICAL EXAMINATION:  Blood pressure 137/72, pulse 64, temperature 97.7 F (36.5 C), temperature source Tympanic, resp. rate 18, height '5\' 8"'  (1.727 m), weight 191 lb 11.2 oz (87 kg), SpO2 100 %.  ECOG PERFORMANCE STATUS: 1 - Symptomatic but completely ambulatory  Physical Exam  Constitutional: Oriented to person, place, and time and well-developed, well-nourished, and in no distress.  HENT:  Head: Normocephalic and atraumatic.  Mouth/Throat: Oropharynx is clear and moist. No oropharyngeal exudate.  Eyes: Conjunctivae are normal. Right eye exhibits no discharge. Left eye exhibits no discharge. No scleral icterus.  Neck: Normal range of motion. Neck supple.  Cardiovascular: Normal rate, regular rhythm, normal heart sounds and intact distal pulses.  Pulmonary/Chest: Effort normal. Rhonchi noted in both lungs bilaterally. No respiratory distress. No wheezes. No rales.  Abdominal: Soft. Bowel sounds  are normal. Exhibits no distension and no mass. There is no tenderness.  Musculoskeletal: Mild bilateral lower extremity swelling.Normal range of motion.  Lymphadenopathy:  No cervical adenopathy.  Neurological: Alert and oriented to person, place, and time. Exhibits normal muscle tone. Gait normal. Coordination normal.  Skin: Skin is warm. Legs/feet moist secondary to patient using Vaseline on her feet/legs.No rash noted. Not diaphoretic. No erythema. No pallor.Discolored and thickened toe nails. Psychiatric: Mood, memory and judgment normal.  Vitals reviewed.  LABORATORY DATA: Lab Results  Component Value Date   WBC 3.4 (L) 01/25/2020   HGB 11.3 (L) 01/25/2020   HCT 34.0 (L) 01/25/2020   MCV 98.8 01/25/2020   PLT 117 (L) 01/25/2020      Chemistry      Component Value Date/Time   NA 138 01/25/2020 0826   K 4.4 01/25/2020 0826   CL 108 01/25/2020 0826   CO2 24 01/25/2020 0826   BUN 16 01/25/2020 0826   CREATININE 1.27 (H) 01/25/2020 0826      Component Value Date/Time   CALCIUM 8.5 (L) 01/25/2020 0826   ALKPHOS 58 01/25/2020 0826   AST 15 01/25/2020 0826   ALT 7 01/25/2020 0826   BILITOT 1.0 01/25/2020 0826       RADIOGRAPHIC STUDIES:  No results found.   ASSESSMENT/PLAN:  This is a very pleasant 69years old African female originally from [place] who is visiting her son in [place]  and was diagnosed with IgA multiple myeloma.  The patient was a started initially on treatment with weekly subcutaneous Velcade 1.3 mg/M2, Revlimid 25 mg p.o. daily for 21 days every 4 weeks as well as weekly Decadron 40 mg orally. She was tolerating the treatment well but she developed significant skin rash secondary to treatment with Revlimid and this was discontinued.  The patient continued treatment with subcutaneous Velcade and Decadron.She developed evidence of disease progression in February 2021. Her treatment was discontinued.  The patient was then  started on Pomalyst 4 mg p.o. daily for 21 days on every 4 weeks with weekly Decadron 40 mg p.o. She is status post 6 days of treatment. She developed a significant skin rash/reaction to Pomalyst.This was discontinued.  The patient is currently undergoing treatment withCarfilzomibon days 1, 2, 8, 9, 15, and 16 and Cytoxan on days 1, 8, and 15 IV every 4 weeks with 40 mg p.o. weekly of Decadron.She is status post 9cycles.   Labs were reviewed. We will arrange for a chest x ray today to rule out infection. Reviewed with Dr. Julien Nordmann. The xray showed linear left basilar opacities, which could represent atelectasis, aspiration, and/or pneumonia. Her oxygen saturation is 100%. No acute distress. She denies any associated symptoms with the cough. We will send an antibiotic to her pharmacy. Per Dr. Julien Nordmann, she is ok to treat today.   She will need to take 40 mg of decadron while in the clinic today since she did not take it prior to her appointment.   I will arrange for a repeat myeloma panel before her next cycle of treatment.   She was advised to use lotion or hydrocortisone cream in the localized area of her neck that itches. No rashes present.   The patient was advised to call immediately if she has any concerning symptoms in the interval. The patient voices understanding of current disease status and treatment options and is in agreement with the current care plan. All questions were answered. The patient knows to call the clinic with any problems, questions or concerns. We can certainly see the patient much sooner if necessary    Orders Placed This Encounter  Procedures  . DG Chest 2 View    Standing Status:   Future    Standing Expiration Date:   01/24/2021    Order Specific Question:   Reason for Exam (SYMPTOM  OR DIAGNOSIS REQUIRED)    Answer:   Cough    Order Specific Question:   Preferred imaging location?    Answer:   [place],  [facility] 01/25/20

## 2020-01-25 ENCOUNTER — Inpatient Hospital Stay: Payer: Self-pay

## 2020-01-25 ENCOUNTER — Encounter: Payer: Self-pay | Admitting: Physician Assistant

## 2020-01-25 ENCOUNTER — Other Ambulatory Visit: Payer: Self-pay

## 2020-01-25 ENCOUNTER — Inpatient Hospital Stay: Payer: Self-pay | Attending: Internal Medicine

## 2020-01-25 ENCOUNTER — Inpatient Hospital Stay (HOSPITAL_BASED_OUTPATIENT_CLINIC_OR_DEPARTMENT_OTHER): Payer: Self-pay | Admitting: Physician Assistant

## 2020-01-25 ENCOUNTER — Ambulatory Visit (HOSPITAL_COMMUNITY)
Admission: RE | Admit: 2020-01-25 | Discharge: 2020-01-25 | Disposition: A | Discharge status: Home / Self Care | Payer: Self-pay | Source: Ambulatory Visit | Attending: Physician Assistant | Admitting: Physician Assistant

## 2020-01-25 ENCOUNTER — Other Ambulatory Visit: Payer: Self-pay | Admitting: Physician Assistant

## 2020-01-25 VITALS — BP 137/72 | HR 64 | Temp 97.7°F | Resp 18 | Ht 68.0 in | Wt 191.7 lb

## 2020-01-25 DIAGNOSIS — R059 Cough, unspecified: Secondary | ICD-10-CM | POA: Insufficient documentation

## 2020-01-25 DIAGNOSIS — C9000 Multiple myeloma not having achieved remission: Secondary | ICD-10-CM

## 2020-01-25 DIAGNOSIS — Z79899 Other long term (current) drug therapy: Secondary | ICD-10-CM | POA: Insufficient documentation

## 2020-01-25 DIAGNOSIS — Z5111 Encounter for antineoplastic chemotherapy: Secondary | ICD-10-CM

## 2020-01-25 DIAGNOSIS — Z1152 Encounter for screening for COVID-19: Secondary | ICD-10-CM | POA: Insufficient documentation

## 2020-01-25 DIAGNOSIS — Z23 Encounter for immunization: Secondary | ICD-10-CM | POA: Insufficient documentation

## 2020-01-25 DIAGNOSIS — Z20822 Contact with and (suspected) exposure to covid-19: Secondary | ICD-10-CM | POA: Insufficient documentation

## 2020-01-25 LAB — CMP (CANCER CENTER ONLY)
ALT: 7 U/L (ref 0–44)
AST: 15 U/L (ref 15–41)
Albumin: 3.4 g/dL — ABNORMAL LOW (ref 3.5–5.0)
Alkaline Phosphatase: 58 U/L (ref 38–126)
Anion gap: 6 (ref 5–15)
BUN: 16 mg/dL (ref 8–23)
CO2: 24 mmol/L (ref 22–32)
Calcium: 8.5 mg/dL — ABNORMAL LOW (ref 8.9–10.3)
Chloride: 108 mmol/L (ref 98–111)
Creatinine: 1.27 mg/dL — ABNORMAL HIGH (ref 0.44–1.00)
GFR, Estimated: 46 mL/min — ABNORMAL LOW (ref >60)
Glucose, Bld: 116 mg/dL — ABNORMAL HIGH (ref 70–99)
Potassium: 4.4 mmol/L (ref 3.5–5.1)
Sodium: 138 mmol/L (ref 135–145)
Total Bilirubin: 1 mg/dL (ref 0.3–1.2)
Total Protein: 6 g/dL — ABNORMAL LOW (ref 6.5–8.1)

## 2020-01-25 LAB — CBC WITH DIFFERENTIAL (CANCER CENTER ONLY)
Abs Immature Granulocytes: 0.01 10*3/uL (ref 0.00–0.07)
Basophils Absolute: 0 10*3/uL (ref 0.0–0.1)
Basophils Relative: 1 %
Eosinophils Absolute: 0.3 10*3/uL (ref 0.0–0.5)
Eosinophils Relative: 10 %
HCT: 34 % — ABNORMAL LOW (ref 36.0–46.0)
Hemoglobin: 11.3 g/dL — ABNORMAL LOW (ref 12.0–15.0)
Immature Granulocytes: 0 %
Lymphocytes Relative: 16 %
Lymphs Abs: 0.5 10*3/uL — ABNORMAL LOW (ref 0.7–4.0)
MCH: 32.8 pg (ref 26.0–34.0)
MCHC: 33.2 g/dL (ref 30.0–36.0)
MCV: 98.8 fL (ref 80.0–100.0)
Monocytes Absolute: 0.5 10*3/uL (ref 0.1–1.0)
Monocytes Relative: 14 %
Neutro Abs: 2 10*3/uL (ref 1.7–7.7)
Neutrophils Relative %: 59 %
Platelet Count: 117 10*3/uL — ABNORMAL LOW (ref 150–400)
RBC: 3.44 MIL/uL — ABNORMAL LOW (ref 3.87–5.11)
RDW: 14.4 % (ref 11.5–15.5)
WBC Count: 3.4 10*3/uL — ABNORMAL LOW (ref 4.0–10.5)
nRBC: 0 % (ref 0.0–0.2)

## 2020-01-25 MED ORDER — DEXTROSE 5 % IV SOLN
36.0000 mg/m2 | Freq: Once | INTRAVENOUS | Status: AC
  Administered 2020-01-25: 80 mg via INTRAVENOUS
  Filled 2020-01-25: qty 30

## 2020-01-25 MED ORDER — HEPARIN SOD (PORK) LOCK FLUSH 100 UNIT/ML IV SOLN
500.0000 [IU] | Freq: Once | INTRAVENOUS | Status: AC | PRN
  Administered 2020-01-25: 500 [IU]
  Filled 2020-01-25: qty 5

## 2020-01-25 MED ORDER — PALONOSETRON HCL INJECTION 0.25 MG/5ML
INTRAVENOUS | Status: AC
  Filled 2020-01-25: qty 5

## 2020-01-25 MED ORDER — DEXAMETHASONE 4 MG PO TABS
ORAL_TABLET | ORAL | Status: AC
  Filled 2020-01-25: qty 2

## 2020-01-25 MED ORDER — SODIUM CHLORIDE 0.9 % IV SOLN
Freq: Once | INTRAVENOUS | Status: AC
  Filled 2020-01-25: qty 250

## 2020-01-25 MED ORDER — SODIUM CHLORIDE 0.9 % IV SOLN
300.0000 mg/m2 | Freq: Once | INTRAVENOUS | Status: AC
  Administered 2020-01-25: 640 mg via INTRAVENOUS
  Filled 2020-01-25: qty 32

## 2020-01-25 MED ORDER — PALONOSETRON HCL INJECTION 0.25 MG/5ML
0.2500 mg | Freq: Once | INTRAVENOUS | Status: AC
  Administered 2020-01-25: 0.25 mg via INTRAVENOUS

## 2020-01-25 MED ORDER — SODIUM CHLORIDE 0.9% FLUSH
10.0000 mL | INTRAVENOUS | Status: DC | PRN
  Administered 2020-01-25: 10 mL
  Filled 2020-01-25: qty 10

## 2020-01-25 MED ORDER — DEXAMETHASONE 4 MG PO TABS
40.0000 mg | ORAL_TABLET | Freq: Once | ORAL | Status: AC
  Administered 2020-01-25: 40 mg via ORAL

## 2020-01-25 MED ORDER — AZITHROMYCIN 250 MG PO TABS: ORAL_TABLET | ORAL | 0 refills | Status: DC

## 2020-01-25 MED ORDER — DEXAMETHASONE 4 MG PO TABS
ORAL_TABLET | ORAL | Status: AC
  Filled 2020-01-25: qty 10

## 2020-01-25 MED ORDER — DEXAMETHASONE 4 MG PO TABS
ORAL_TABLET | ORAL | Status: AC
  Filled 2020-01-25: qty 6

## 2020-01-25 MED FILL — AZITHROMYCIN 250 MG TABLET: 250 | 5 days supply | Qty: 6 | Fill #0

## 2020-01-25 NOTE — Patient Instructions (Signed)
[facility] Discharge Instructions for Patients Receiving Chemotherapy  Today you received the following chemotherapy agents carfilzomib, cytoxan.   To help prevent nausea and vomiting after your treatment, we encourage you to take your nausea medication as directed.    If you develop nausea and vomiting that is not controlled by your nausea medication, call the clinic.   BELOW ARE SYMPTOMS THAT SHOULD BE REPORTED IMMEDIATELY:  *FEVER GREATER THAN 100.5 F  *CHILLS WITH OR WITHOUT FEVER  NAUSEA AND VOMITING THAT IS NOT CONTROLLED WITH YOUR NAUSEA MEDICATION  *UNUSUAL SHORTNESS OF BREATH  *UNUSUAL BRUISING OR BLEEDING  TENDERNESS IN MOUTH AND THROAT WITH OR WITHOUT PRESENCE OF ULCERS  *URINARY PROBLEMS  *BOWEL PROBLEMS  UNUSUAL RASH Items with * indicate a potential emergency and should be followed up as soon as possible.  Feel free to call the clinic should you have any questions or concerns. The clinic phone number is [phone].  Please show the Home at check-in to the Emergency Department and triage nurse.

## 2020-01-25 NOTE — Progress Notes (Signed)
Pt discharged in stable condition.

## 2020-01-26 ENCOUNTER — Inpatient Hospital Stay: Payer: Self-pay

## 2020-01-26 ENCOUNTER — Telehealth: Payer: Self-pay | Admitting: Physician Assistant

## 2020-01-26 ENCOUNTER — Other Ambulatory Visit: Payer: Self-pay

## 2020-01-26 ENCOUNTER — Ambulatory Visit: Payer: Self-pay | Admitting: Internal Medicine

## 2020-01-26 VITALS — BP 150/81 | HR 72 | Temp 98.7°F | Resp 18

## 2020-01-26 DIAGNOSIS — C9000 Multiple myeloma not having achieved remission: Secondary | ICD-10-CM

## 2020-01-26 MED ORDER — HEPARIN SOD (PORK) LOCK FLUSH 100 UNIT/ML IV SOLN
500.0000 [IU] | Freq: Once | INTRAVENOUS | Status: AC | PRN
  Administered 2020-01-26: 500 [IU]
  Filled 2020-01-26: qty 5

## 2020-01-26 MED ORDER — SODIUM CHLORIDE 0.9% FLUSH
10.0000 mL | INTRAVENOUS | Status: DC | PRN
  Administered 2020-01-26: 10 mL
  Filled 2020-01-26: qty 10

## 2020-01-26 MED ORDER — PROCHLORPERAZINE MALEATE 10 MG PO TABS
ORAL_TABLET | ORAL | Status: AC
  Filled 2020-01-26: qty 1

## 2020-01-26 MED ORDER — SODIUM CHLORIDE 0.9 % IV SOLN
Freq: Once | INTRAVENOUS | Status: DC
  Filled 2020-01-26: qty 250

## 2020-01-26 MED ORDER — PROCHLORPERAZINE MALEATE 10 MG PO TABS
10.0000 mg | ORAL_TABLET | Freq: Once | ORAL | Status: AC
  Administered 2020-01-26: 10 mg via ORAL

## 2020-01-26 MED ORDER — DEXTROSE 5 % IV SOLN
36.0000 mg/m2 | Freq: Once | INTRAVENOUS | Status: AC
  Administered 2020-01-26: 80 mg via INTRAVENOUS
  Filled 2020-01-26: qty 30

## 2020-01-26 MED ORDER — SODIUM CHLORIDE 0.9 % IV SOLN
Freq: Once | INTRAVENOUS | Status: AC
  Filled 2020-01-26: qty 250

## 2020-01-26 NOTE — Progress Notes (Signed)
Patient stable upon leaving infusion room.  

## 2020-01-26 NOTE — Patient Instructions (Signed)
[facility] Discharge Instructions for Patients Receiving Chemotherapy  Today you received the following chemotherapy agents: carfilzomib.  To help prevent nausea and vomiting after your treatment, we encourage you to take your nausea medication as directed.   If you develop nausea and vomiting that is not controlled by your nausea medication, call the clinic.   BELOW ARE SYMPTOMS THAT SHOULD BE REPORTED IMMEDIATELY:  *FEVER GREATER THAN 100.5 F  *CHILLS WITH OR WITHOUT FEVER  NAUSEA AND VOMITING THAT IS NOT CONTROLLED WITH YOUR NAUSEA MEDICATION  *UNUSUAL SHORTNESS OF BREATH  *UNUSUAL BRUISING OR BLEEDING  TENDERNESS IN MOUTH AND THROAT WITH OR WITHOUT PRESENCE OF ULCERS  *URINARY PROBLEMS  *BOWEL PROBLEMS  UNUSUAL RASH Items with * indicate a potential emergency and should be followed up as soon as possible.  Feel free to call the clinic should you have any questions or concerns. The clinic phone number is [phone].  Please show the CHEMO ALERT CARD at check-in to the Emergency Department and triage nurse.   

## 2020-01-26 NOTE — Telephone Encounter (Signed)
Scheduled per los. Gave printout in infusion  

## 2020-01-27 ENCOUNTER — Inpatient Hospital Stay: Payer: Self-pay

## 2020-02-02 ENCOUNTER — Inpatient Hospital Stay: Payer: Self-pay

## 2020-02-02 ENCOUNTER — Other Ambulatory Visit: Payer: Self-pay

## 2020-02-02 VITALS — BP 172/93 | HR 63 | Temp 98.7°F | Resp 18 | Ht 68.0 in | Wt 189.5 lb

## 2020-02-02 DIAGNOSIS — Z95828 Presence of other vascular implants and grafts: Secondary | ICD-10-CM

## 2020-02-02 DIAGNOSIS — C9000 Multiple myeloma not having achieved remission: Secondary | ICD-10-CM

## 2020-02-02 LAB — CBC WITH DIFFERENTIAL (CANCER CENTER ONLY)
Abs Immature Granulocytes: 0.03 10*3/uL (ref 0.00–0.07)
Basophils Absolute: 0 10*3/uL (ref 0.0–0.1)
Basophils Relative: 1 %
Eosinophils Absolute: 0.1 10*3/uL (ref 0.0–0.5)
Eosinophils Relative: 2 %
HCT: 34.5 % — ABNORMAL LOW (ref 36.0–46.0)
Hemoglobin: 11.2 g/dL — ABNORMAL LOW (ref 12.0–15.0)
Immature Granulocytes: 1 %
Lymphocytes Relative: 10 %
Lymphs Abs: 0.4 10*3/uL — ABNORMAL LOW (ref 0.7–4.0)
MCH: 32.1 pg (ref 26.0–34.0)
MCHC: 32.5 g/dL (ref 30.0–36.0)
MCV: 98.9 fL (ref 80.0–100.0)
Monocytes Absolute: 0.1 10*3/uL (ref 0.1–1.0)
Monocytes Relative: 3 %
Neutro Abs: 2.9 10*3/uL (ref 1.7–7.7)
Neutrophils Relative %: 83 %
Platelet Count: 70 10*3/uL — ABNORMAL LOW (ref 150–400)
RBC: 3.49 MIL/uL — ABNORMAL LOW (ref 3.87–5.11)
RDW: 15.3 % (ref 11.5–15.5)
WBC Count: 3.5 10*3/uL — ABNORMAL LOW (ref 4.0–10.5)
nRBC: 0.6 % — ABNORMAL HIGH (ref 0.0–0.2)

## 2020-02-02 LAB — CMP (CANCER CENTER ONLY)
ALT: 14 U/L (ref 0–44)
AST: 26 U/L (ref 15–41)
Albumin: 3.5 g/dL (ref 3.5–5.0)
Alkaline Phosphatase: 65 U/L (ref 38–126)
Anion gap: 9 (ref 5–15)
BUN: 20 mg/dL (ref 8–23)
CO2: 21 mmol/L — ABNORMAL LOW (ref 22–32)
Calcium: 8.7 mg/dL — ABNORMAL LOW (ref 8.9–10.3)
Chloride: 110 mmol/L (ref 98–111)
Creatinine: 1.7 mg/dL — ABNORMAL HIGH (ref 0.44–1.00)
GFR, Estimated: 32 mL/min — ABNORMAL LOW (ref >60)
Glucose, Bld: 112 mg/dL — ABNORMAL HIGH (ref 70–99)
Potassium: 5 mmol/L (ref 3.5–5.1)
Sodium: 140 mmol/L (ref 135–145)
Total Bilirubin: 0.6 mg/dL (ref 0.3–1.2)
Total Protein: 6.3 g/dL — ABNORMAL LOW (ref 6.5–8.1)

## 2020-02-02 MED ORDER — SODIUM CHLORIDE 0.9% FLUSH
10.0000 mL | INTRAVENOUS | Status: DC | PRN
  Administered 2020-02-02: 10 mL
  Filled 2020-02-02: qty 10

## 2020-02-02 MED ORDER — HEPARIN SOD (PORK) LOCK FLUSH 100 UNIT/ML IV SOLN
500.0000 [IU] | Freq: Once | INTRAVENOUS | Status: AC | PRN
  Administered 2020-02-02: 500 [IU]
  Filled 2020-02-02: qty 5

## 2020-02-02 MED FILL — ACYCLOVIR 200 MG CAP: 200 | 30 days supply | Qty: 60 | Fill #2

## 2020-02-02 MED FILL — DEXAMETHASONE 4 MG TABLET: 4 | 28 days supply | Qty: 40 | Fill #1

## 2020-02-02 MED FILL — OMEPRAZOLE 20 MG CAP: 20 | 30 days supply | Qty: 30 | Fill #2

## 2020-02-02 NOTE — Patient Instructions (Signed)
[facility] Discharge Instructions for Patients Receiving Chemotherapy    To help prevent nausea and vomiting after your treatment, we encourage you to take your nausea medication as directed   If you develop nausea and vomiting that is not controlled by your nausea medication, call the clinic.   BELOW ARE SYMPTOMS THAT SHOULD BE REPORTED IMMEDIATELY:  *FEVER GREATER THAN 100.5 F  *CHILLS WITH OR WITHOUT FEVER  NAUSEA AND VOMITING THAT IS NOT CONTROLLED WITH YOUR NAUSEA MEDICATION  *UNUSUAL SHORTNESS OF BREATH  *UNUSUAL BRUISING OR BLEEDING  TENDERNESS IN MOUTH AND THROAT WITH OR WITHOUT PRESENCE OF ULCERS  *URINARY PROBLEMS  *BOWEL PROBLEMS  UNUSUAL RASH Items with * indicate a potential emergency and should be followed up as soon as possible.  Feel free to call the clinic should you have any questions or concerns. The clinic phone number is [phone].  Please show the [facility] at check-in to the Emergency Department and triage nurse.   Thrombocytopenia Thrombocytopenia means that you have a low number of platelets in your blood. Platelets are tiny cells in the blood. When you bleed, they clump together at the cut or injury to stop the bleeding. This is called blood clotting. If you do not have enough platelets, it can cause bleeding problems. Some cases of this condition are mild while others are more severe. What are the causes? This condition may be caused by:  Your body not making enough platelets. This may be caused by: ? Your bone marrow not making blood cells (aplastic anemia). ? Cancer in the bone marrow. ? Certain medicines. ? Infection in the bone marrow. ? Drinking a lot of alcohol.  Your body destroying platelets too quickly. This may be caused by: ? Certain immune diseases. ? Certain medicines. ? Certain blood clotting disorders. ? Certain disorders that are passed from parent to child (inherited). ? Certain bleeding  disorders. ? Pregnancy. ? Having a spleen that is larger than normal. What are the signs or symptoms?  Bleeding that is not normal.  Nosebleeds.  Heavy menstrual periods.  Blood in the pee (urine) or poop (stool).  A purple-like color to the skin (purpura).  Bruising.  A rash that looks like pinpoint, purple-red spots (petechiae). How is this treated?  Treatment of another condition that is causing the low platelet count.  Medicines to help protect your platelets from being destroyed.  A replacement (transfusion) of platelets to stop or prevent bleeding.  Surgery to remove the spleen. Follow these instructions at home: Activity  Avoid activities that could cause you to get hurt or bruised. Follow instructions about how to prevent falls.  Take care not to cut yourself: ? When you shave. ? When you use scissors, needles, knives, or other tools.  Take care not to burn yourself: ? When you use an iron. ? When you cook. General instructions   Check your skin and the inside of your mouth for bruises or blood as told by your doctor.  Check to see if there is blood in your spit (sputum), pee, and poop. Do this as told by your doctor.  Do not drink alcohol.  Take over-the-counter and prescription medicines only as told by your doctor.  Do not take any medicines that have aspirin or NSAIDs in them. These medicines can thin your blood and cause you to bleed.  Tell all of your doctors that you have this condition. Be sure to tell your dentist and eye doctor too. Contact  a doctor if:  You have bruises and you do not know why. Get help right away if:  You are bleeding anywhere on your body.  You have blood in your spit, pee, or poop. Summary  Thrombocytopenia means that you have a low number of platelets in your blood.  Platelets are needed for blood clotting.  Symptoms of this condition include bleeding that is not normal, and bruising.  Take care not to cut or  burn yourself. This information is not intended to replace advice given to you by your health care provider. Make sure you discuss any questions you have with your health care provider. Document Revised: 11/12/2017 Document Reviewed: 11/12/2017 Elsevier Patient Education  [place].

## 2020-02-02 NOTE — Progress Notes (Signed)
Treatment cancelled today due to thrombocytopenia

## 2020-02-03 ENCOUNTER — Inpatient Hospital Stay: Payer: Self-pay

## 2020-02-09 ENCOUNTER — Other Ambulatory Visit: Payer: Self-pay

## 2020-02-09 ENCOUNTER — Inpatient Hospital Stay: Payer: Self-pay

## 2020-02-09 VITALS — BP 157/77 | HR 63 | Temp 97.9°F | Resp 18

## 2020-02-09 DIAGNOSIS — C9000 Multiple myeloma not having achieved remission: Secondary | ICD-10-CM

## 2020-02-09 LAB — CMP (CANCER CENTER ONLY)
ALT: 12 U/L (ref 0–44)
AST: 17 U/L (ref 15–41)
Albumin: 3.6 g/dL (ref 3.5–5.0)
Alkaline Phosphatase: 58 U/L (ref 38–126)
Anion gap: 5 (ref 5–15)
BUN: 16 mg/dL (ref 8–23)
CO2: 26 mmol/L (ref 22–32)
Calcium: 8.8 mg/dL — ABNORMAL LOW (ref 8.9–10.3)
Chloride: 110 mmol/L (ref 98–111)
Creatinine: 1.28 mg/dL — ABNORMAL HIGH (ref 0.44–1.00)
GFR, Estimated: 45 mL/min — ABNORMAL LOW (ref >60)
Glucose, Bld: 109 mg/dL — ABNORMAL HIGH (ref 70–99)
Potassium: 4.5 mmol/L (ref 3.5–5.1)
Sodium: 141 mmol/L (ref 135–145)
Total Bilirubin: 0.5 mg/dL (ref 0.3–1.2)
Total Protein: 6 g/dL — ABNORMAL LOW (ref 6.5–8.1)

## 2020-02-09 LAB — CBC WITH DIFFERENTIAL (CANCER CENTER ONLY)
Abs Immature Granulocytes: 0 10*3/uL (ref 0.00–0.07)
Basophils Absolute: 0 10*3/uL (ref 0.0–0.1)
Basophils Relative: 1 %
Eosinophils Absolute: 0.1 10*3/uL (ref 0.0–0.5)
Eosinophils Relative: 2 %
HCT: 35.6 % — ABNORMAL LOW (ref 36.0–46.0)
Hemoglobin: 11.7 g/dL — ABNORMAL LOW (ref 12.0–15.0)
Immature Granulocytes: 0 %
Lymphocytes Relative: 17 %
Lymphs Abs: 0.6 10*3/uL — ABNORMAL LOW (ref 0.7–4.0)
MCH: 32.7 pg (ref 26.0–34.0)
MCHC: 32.9 g/dL (ref 30.0–36.0)
MCV: 99.4 fL (ref 80.0–100.0)
Monocytes Absolute: 0.1 10*3/uL (ref 0.1–1.0)
Monocytes Relative: 3 %
Neutro Abs: 2.6 10*3/uL (ref 1.7–7.7)
Neutrophils Relative %: 77 %
Platelet Count: 96 10*3/uL — ABNORMAL LOW (ref 150–400)
RBC: 3.58 MIL/uL — ABNORMAL LOW (ref 3.87–5.11)
RDW: 14.6 % (ref 11.5–15.5)
WBC Count: 3.3 10*3/uL — ABNORMAL LOW (ref 4.0–10.5)
nRBC: 0 % (ref 0.0–0.2)

## 2020-02-09 LAB — LACTATE DEHYDROGENASE: LDH: 288 U/L — ABNORMAL HIGH (ref 98–192)

## 2020-02-09 MED ORDER — SODIUM CHLORIDE 0.9 % IV SOLN
Freq: Once | INTRAVENOUS | Status: AC
  Filled 2020-02-09: qty 250

## 2020-02-09 MED ORDER — PALONOSETRON HCL INJECTION 0.25 MG/5ML
0.2500 mg | Freq: Once | INTRAVENOUS | Status: AC
  Administered 2020-02-09: 12:00:00 0.25 mg via INTRAVENOUS

## 2020-02-09 MED ORDER — HEPARIN SOD (PORK) LOCK FLUSH 100 UNIT/ML IV SOLN
500.0000 [IU] | Freq: Once | INTRAVENOUS | Status: AC | PRN
  Administered 2020-02-09: 14:00:00 500 [IU]
  Filled 2020-02-09: qty 5

## 2020-02-09 MED ORDER — SODIUM CHLORIDE 0.9 % IV SOLN
300.0000 mg/m2 | Freq: Once | INTRAVENOUS | Status: AC
  Administered 2020-02-09: 14:00:00 640 mg via INTRAVENOUS
  Filled 2020-02-09: qty 32

## 2020-02-09 MED ORDER — CARFILZOMIB CHEMO INJECTION 60 MG
36.0000 mg/m2 | Freq: Once | INTRAVENOUS | Status: AC
  Administered 2020-02-09: 80 mg via INTRAVENOUS
  Filled 2020-02-09: qty 10

## 2020-02-09 MED ORDER — PALONOSETRON HCL INJECTION 0.25 MG/5ML
INTRAVENOUS | Status: AC
  Filled 2020-02-09: qty 5

## 2020-02-09 MED ORDER — SODIUM CHLORIDE 0.9 % IV SOLN
Freq: Once | INTRAVENOUS | Status: DC
  Filled 2020-02-09: qty 250

## 2020-02-09 MED ORDER — SODIUM CHLORIDE 0.9% FLUSH
10.0000 mL | INTRAVENOUS | Status: DC | PRN
  Administered 2020-02-09: 14:00:00 10 mL
  Filled 2020-02-09: qty 10

## 2020-02-09 NOTE — Progress Notes (Signed)
Ok to treat per Dr. Julien Nordmann with Plt 96

## 2020-02-09 NOTE — Progress Notes (Signed)
Continue same doses of chemo per Dr. Julien Nordmann.  Kennith Center, Pharm.D., CPP 02/09/2020@11 :53 AM

## 2020-02-09 NOTE — Patient Instructions (Signed)
[facility] Discharge Instructions for Patients Receiving Chemotherapy  Today you received the following chemotherapy agents carfilzomib, cyclophasmamide.  To help prevent nausea and vomiting after your treatment, we encourage you to take your nausea medication as directed.    If you develop nausea and vomiting that is not controlled by your nausea medication, call the clinic.   BELOW ARE SYMPTOMS THAT SHOULD BE REPORTED IMMEDIATELY:  *FEVER GREATER THAN 100.5 F  *CHILLS WITH OR WITHOUT FEVER  NAUSEA AND VOMITING THAT IS NOT CONTROLLED WITH YOUR NAUSEA MEDICATION  *UNUSUAL SHORTNESS OF BREATH  *UNUSUAL BRUISING OR BLEEDING  TENDERNESS IN MOUTH AND THROAT WITH OR WITHOUT PRESENCE OF ULCERS  *URINARY PROBLEMS  *BOWEL PROBLEMS  UNUSUAL RASH Items with * indicate a potential emergency and should be followed up as soon as possible.  Feel free to call the clinic should you have any questions or concerns. The clinic phone number is [phone].  Please show the [place] [facility] at check-in to the Emergency Department and triage nurse.

## 2020-02-10 ENCOUNTER — Inpatient Hospital Stay: Payer: Self-pay

## 2020-02-10 ENCOUNTER — Other Ambulatory Visit: Payer: Self-pay

## 2020-02-10 VITALS — BP 147/85 | HR 75 | Temp 98.5°F | Resp 17

## 2020-02-10 DIAGNOSIS — Z299 Encounter for prophylactic measures, unspecified: Secondary | ICD-10-CM

## 2020-02-10 DIAGNOSIS — C9000 Multiple myeloma not having achieved remission: Secondary | ICD-10-CM

## 2020-02-10 DIAGNOSIS — Z23 Encounter for immunization: Secondary | ICD-10-CM

## 2020-02-10 LAB — IGG, IGA, IGM
IgA: 766 mg/dL — ABNORMAL HIGH (ref 87–352)
IgG (Immunoglobin G), Serum: 152 mg/dL — ABNORMAL LOW (ref 586–1602)
IgM (Immunoglobulin M), Srm: <5 mg/dL — ABNORMAL LOW (ref 26–217)

## 2020-02-10 LAB — KAPPA/LAMBDA LIGHT CHAINS
Kappa free light chain: 5 mg/L (ref 3.3–19.4)
Kappa, lambda light chain ratio: 0.01 — ABNORMAL LOW (ref 0.26–1.65)
Lambda free light chains: 530.6 mg/L — ABNORMAL HIGH (ref 5.7–26.3)

## 2020-02-10 LAB — BETA 2 MICROGLOBULIN, SERUM: Beta-2 Microglobulin: 3.3 mg/L — ABNORMAL HIGH (ref 0.6–2.4)

## 2020-02-10 MED ORDER — SODIUM CHLORIDE 0.9 % IV SOLN
Freq: Once | INTRAVENOUS | Status: AC
  Filled 2020-02-10: qty 250

## 2020-02-10 MED ORDER — INFLUENZA VAC A&B SA ADJ QUAD 0.5 ML IM PRSY
0.5000 mL | PREFILLED_SYRINGE | Freq: Once | INTRAMUSCULAR | Status: AC
  Administered 2020-02-10: 11:00:00 0.5 mL via INTRAMUSCULAR

## 2020-02-10 MED ORDER — PROCHLORPERAZINE MALEATE 10 MG PO TABS
10.0000 mg | ORAL_TABLET | Freq: Once | ORAL | Status: AC
  Administered 2020-02-10: 09:00:00 10 mg via ORAL

## 2020-02-10 MED ORDER — SODIUM CHLORIDE 0.9% FLUSH
10.0000 mL | INTRAVENOUS | Status: DC | PRN
  Administered 2020-02-10: 11:00:00 10 mL
  Filled 2020-02-10: qty 10

## 2020-02-10 MED ORDER — DEXTROSE 5 % IV SOLN
36.0000 mg/m2 | Freq: Once | INTRAVENOUS | Status: AC
  Administered 2020-02-10: 80 mg via INTRAVENOUS
  Filled 2020-02-10: qty 30

## 2020-02-10 MED ORDER — HEPARIN SOD (PORK) LOCK FLUSH 100 UNIT/ML IV SOLN
500.0000 [IU] | Freq: Once | INTRAVENOUS | Status: AC | PRN
  Administered 2020-02-10: 11:00:00 500 [IU]
  Filled 2020-02-10: qty 5

## 2020-02-10 MED ORDER — INFLUENZA VAC A&B SA ADJ QUAD 0.5 ML IM PRSY
PREFILLED_SYRINGE | INTRAMUSCULAR | Status: AC
  Filled 2020-02-10: qty 0.5

## 2020-02-10 MED ORDER — PROCHLORPERAZINE MALEATE 10 MG PO TABS
ORAL_TABLET | ORAL | Status: AC
  Filled 2020-02-10: qty 1

## 2020-02-10 NOTE — Patient Instructions (Addendum)
[facility] Discharge Instructions for Patients Receiving Chemotherapy  Today you received the following chemotherapy agents: kyprolis  To help prevent nausea and vomiting after your treatment, we encourage you to take your nausea medication as directed.    If you develop nausea and vomiting that is not controlled by your nausea medication, call the clinic.   BELOW ARE SYMPTOMS THAT SHOULD BE REPORTED IMMEDIATELY:  *FEVER GREATER THAN 100.5 F  *CHILLS WITH OR WITHOUT FEVER  NAUSEA AND VOMITING THAT IS NOT CONTROLLED WITH YOUR NAUSEA MEDICATION  *UNUSUAL SHORTNESS OF BREATH  *UNUSUAL BRUISING OR BLEEDING  TENDERNESS IN MOUTH AND THROAT WITH OR WITHOUT PRESENCE OF ULCERS  *URINARY PROBLEMS  *BOWEL PROBLEMS  UNUSUAL RASH Items with * indicate a potential emergency and should be followed up as soon as possible.  Feel free to call the clinic should you have any questions or concerns. The clinic phone number is [phone].  Please show the [facility] at check-in to the Emergency Department and triage nurse.  Influenza Virus Vaccine injection What is this medicine? INFLUENZA VIRUS VACCINE (in floo EN zuh VAHY ruhs vak SEEN) helps to reduce the risk of getting influenza also known as the flu. The vaccine only helps protect you against some strains of the flu. This medicine may be used for other purposes; ask your health care provider or pharmacist if you have questions. COMMON BRAND NAME(S): Afluria, Afluria Quadrivalent, Agriflu, Alfuria, FLUAD, Fluarix, Fluarix Quadrivalent, Flublok, Flublok Quadrivalent, FLUCELVAX, FLUCELVAX Quadrivalent, Flulaval, Flulaval Quadrivalent, Fluvirin, Fluzone, Fluzone High-Dose, Fluzone Intradermal, Fluzone Quadrivalent What should I tell my health care provider before I take this medicine? They need to know if you have any of these conditions:  bleeding disorder like hemophilia  fever or infection  Guillain-Barre syndrome  or other neurological problems  immune system problems  infection with the human immunodeficiency virus (HIV) or AIDS  low blood platelet counts  multiple sclerosis  an unusual or allergic reaction to influenza virus vaccine, latex, other medicines, foods, dyes, or preservatives. Different brands of vaccines contain different allergens. Some may contain latex or eggs. Talk to your doctor about your allergies to make sure that you get the right vaccine.  pregnant or trying to get pregnant  breast-feeding How should I use this medicine? This vaccine is for injection into a muscle or under the skin. It is given by a health care professional. A copy of Vaccine Information Statements will be given before each vaccination. Read this sheet carefully each time. The sheet may change frequently. Talk to your healthcare provider to see which vaccines are right for you. Some vaccines should not be used in all age groups. Overdosage: If you think you have taken too much of this medicine contact a poison control center or emergency room at once. NOTE: This medicine is only for you. Do not share this medicine with others. What if I miss a dose? This does not apply. What may interact with this medicine?  chemotherapy or radiation therapy  medicines that lower your immune system like etanercept, anakinra, infliximab, and adalimumab  medicines that treat or prevent blood clots like warfarin  phenytoin  steroid medicines like prednisone or cortisone  theophylline  vaccines This list may not describe all possible interactions. Give your health care provider a list of all the medicines, herbs, non-prescription drugs, or dietary supplements you use. Also tell them if you smoke, drink alcohol, or use illegal drugs. Some items may interact with your medicine. What  should I watch for while using this medicine? Report any side effects that do not go away within 3 days to your doctor or health care  professional. Call your health care provider if any unusual symptoms occur within 6 weeks of receiving this vaccine. You may still catch the flu, but the illness is not usually as bad. You cannot get the flu from the vaccine. The vaccine will not protect against colds or other illnesses that may cause fever. The vaccine is needed every year. What side effects may I notice from receiving this medicine? Side effects that you should report to your doctor or health care professional as soon as possible:  allergic reactions like skin rash, itching or hives, swelling of the face, lips, or tongue Side effects that usually do not require medical attention (report to your doctor or health care professional if they continue or are bothersome):  fever  headache  muscle aches and pains  pain, tenderness, redness, or swelling at the injection site  tiredness This list may not describe all possible side effects. Call your doctor for medical advice about side effects. You may report side effects to FDA at 1-800-FDA-1088. Where should I keep my medicine? The vaccine will be given by a health care professional in a clinic, pharmacy, doctor's office, or other health care setting. You will not be given vaccine doses to store at home. NOTE: This sheet is a summary. It may not cover all possible information. If you have questions about this medicine, talk to your doctor, pharmacist, or health care provider.  2020 Elsevier/Gold Standard (2018-01-05 08:45:43)

## 2020-02-22 ENCOUNTER — Inpatient Hospital Stay (HOSPITAL_BASED_OUTPATIENT_CLINIC_OR_DEPARTMENT_OTHER): Payer: Self-pay | Admitting: Medical

## 2020-02-22 ENCOUNTER — Inpatient Hospital Stay (HOSPITAL_BASED_OUTPATIENT_CLINIC_OR_DEPARTMENT_OTHER): Payer: Self-pay | Admitting: Internal Medicine

## 2020-02-22 ENCOUNTER — Inpatient Hospital Stay: Payer: Self-pay

## 2020-02-22 ENCOUNTER — Other Ambulatory Visit: Payer: Self-pay | Admitting: Medical

## 2020-02-22 ENCOUNTER — Other Ambulatory Visit: Payer: Self-pay

## 2020-02-22 VITALS — BP 151/80 | HR 73 | Temp 98.1°F | Resp 16 | Ht 68.0 in | Wt 191.8 lb

## 2020-02-22 DIAGNOSIS — C9000 Multiple myeloma not having achieved remission: Secondary | ICD-10-CM

## 2020-02-22 DIAGNOSIS — R059 Cough, unspecified: Secondary | ICD-10-CM

## 2020-02-22 DIAGNOSIS — Z5111 Encounter for antineoplastic chemotherapy: Secondary | ICD-10-CM

## 2020-02-22 DIAGNOSIS — Z95828 Presence of other vascular implants and grafts: Secondary | ICD-10-CM

## 2020-02-22 LAB — CBC WITH DIFFERENTIAL (CANCER CENTER ONLY)
Abs Immature Granulocytes: 0.01 10*3/uL (ref 0.00–0.07)
Basophils Absolute: 0 10*3/uL (ref 0.0–0.1)
Basophils Relative: 1 %
Eosinophils Absolute: 0.2 10*3/uL (ref 0.0–0.5)
Eosinophils Relative: 9 %
HCT: 34.8 % — ABNORMAL LOW (ref 36.0–46.0)
Hemoglobin: 11.4 g/dL — ABNORMAL LOW (ref 12.0–15.0)
Immature Granulocytes: 0 %
Lymphocytes Relative: 30 %
Lymphs Abs: 0.8 10*3/uL (ref 0.7–4.0)
MCH: 32 pg (ref 26.0–34.0)
MCHC: 32.8 g/dL (ref 30.0–36.0)
MCV: 97.8 fL (ref 80.0–100.0)
Monocytes Absolute: 0.2 10*3/uL (ref 0.1–1.0)
Monocytes Relative: 7 %
Neutro Abs: 1.4 10*3/uL — ABNORMAL LOW (ref 1.7–7.7)
Neutrophils Relative %: 53 %
Platelet Count: 98 10*3/uL — ABNORMAL LOW (ref 150–400)
RBC: 3.56 MIL/uL — ABNORMAL LOW (ref 3.87–5.11)
RDW: 14.6 % (ref 11.5–15.5)
WBC Count: 2.6 10*3/uL — ABNORMAL LOW (ref 4.0–10.5)
nRBC: 0 % (ref 0.0–0.2)

## 2020-02-22 LAB — CMP (CANCER CENTER ONLY)
ALT: 11 U/L (ref 0–44)
AST: 15 U/L (ref 15–41)
Albumin: 3.4 g/dL — ABNORMAL LOW (ref 3.5–5.0)
Alkaline Phosphatase: 62 U/L (ref 38–126)
Anion gap: 6 (ref 5–15)
BUN: 11 mg/dL (ref 8–23)
CO2: 26 mmol/L (ref 22–32)
Calcium: 8.5 mg/dL — ABNORMAL LOW (ref 8.9–10.3)
Chloride: 110 mmol/L (ref 98–111)
Creatinine: 1.08 mg/dL — ABNORMAL HIGH (ref 0.44–1.00)
GFR, Estimated: 56 mL/min — ABNORMAL LOW (ref >60)
Glucose, Bld: 110 mg/dL — ABNORMAL HIGH (ref 70–99)
Potassium: 4.7 mmol/L (ref 3.5–5.1)
Sodium: 142 mmol/L (ref 135–145)
Total Bilirubin: 0.6 mg/dL (ref 0.3–1.2)
Total Protein: 6 g/dL — ABNORMAL LOW (ref 6.5–8.1)

## 2020-02-22 LAB — SARS CORONAVIRUS 2 (TAT 6-24 HRS): SARS Coronavirus 2: NEGATIVE

## 2020-02-22 MED ORDER — SODIUM CHLORIDE 0.9% FLUSH
10.0000 mL | INTRAVENOUS | Status: AC | PRN
  Administered 2020-02-22: 11:00:00 10 mL
  Filled 2020-02-22: qty 10

## 2020-02-22 MED ORDER — SODIUM CHLORIDE 0.9% FLUSH
10.0000 mL | INTRAVENOUS | Status: DC | PRN
  Administered 2020-02-22: 10 mL
  Filled 2020-02-22: qty 10

## 2020-02-22 MED ORDER — HEPARIN SOD (PORK) LOCK FLUSH 100 UNIT/ML IV SOLN
500.0000 [IU] | Freq: Once | INTRAVENOUS | Status: AC | PRN
Start: 2020-02-22 — End: 2020-02-22
  Administered 2020-02-22: 11:00:00 500 [IU]
  Filled 2020-02-22: qty 5

## 2020-02-22 NOTE — Addendum Note (Signed)
Addended by: Gardiner Coins A on: 02/22/2020 11:31 AM   Modules accepted: Orders

## 2020-02-22 NOTE — Progress Notes (Signed)
The patient was seen for collection of a COVID-19 test given that she is short of breath with congestion and a cough.  Her chemotherapy was delayed until next week.  A sample was collected and sent for analysis.  Marga Hoots, MHS, PA-C Physician Assistant

## 2020-02-22 NOTE — Progress Notes (Signed)
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  PRIOR THERAPY: 1)Systemic therapy with Velcade 1.3 mg/M2 weekly, Revlimid 25 mg p.o. daily for 14 days every 3 weeks in addition to weekly Decadron 40 mg orally. First dose of treatment 10/08/2017.Treatment was placed on hold due to development of a significant rash.Sheresumedtreatment withonlydexamethasone and Velcade on 11/19/2017.This was discontinued on 04/14/2019 due to evidence of disease progression. 2)Pomalyst 4 mg p.o. for 21 days every 4 weeks and 40 mg p.o. Decadron once a week. First dose3/06/2019-05/04/2019.Status post 6 days of treatment. Discontinued secondary to allergy to Pomalyst.  CURRENT THERAPY: Chemotherapy with carfilzomibon days 1, 2, 8, 9, 15, and 16and Cytoxan 300 mg/m2 on days 1,8, and 15 IV every 4 weeks and 40 mg p.o. weekly of Decadron. First dose expected on 05/12/2019. Status post 10 cycles.  INTERVAL HISTORY: Debbie Chan 69 y.o. female returns to the clinic today for follow-up visit.  The patient has been complaining of nasal congestion as well as sore throat and cough recently.  She was seen by her primary care physician few weeks ago and given prescription for azithromycin.  She denied having any current fever or chills.  She has no nausea, vomiting, diarrhea or constipation.  She denied having any chest pain, shortness of breath or hemoptysis.  She denied having any recent weight loss or night sweats.  She has been tolerating her systemic chemotherapy fairly well.  The patient is here today for evaluation before starting cycle #11.   MEDICAL HISTORY: Past Medical History:  Diagnosis Date  . Cancer ([place])     ALLERGIES:  has No Known Allergies.  MEDICATIONS:  Current Outpatient Medications  Medication Sig Dispense  Refill  . acyclovir (ZOVIRAX) 200 MG capsule Take 1 capsule (200 mg total) by mouth 2 (two) times daily. 60 capsule 4  . blood glucose meter kit and supplies Dispense based on patient and insurance preference. Use up to four times daily as directed. (FOR ICD-10 E10.9, E11.9). 1 each 0  . dexamethasone (DECADRON) 4 MG tablet Take 10 tablets (40 mg total) by mouth every 7 (seven) days. 40 tablet 3  . diphenhydrAMINE (BENADRYL ALLERGY) 25 MG tablet Take 1 tablet (25 mg total) by mouth every 6 (six) hours as needed for itching. 100 tablet 0  . furosemide (LASIX) 40 MG tablet Take 40 mg by mouth daily as needed.    . lidocaine-prilocaine (EMLA) cream Apply 1 application topically as needed. 30 g 2  . metoprolol tartrate (LOPRESSOR) 25 MG tablet TAKE 1/2 TABLET BY MOUTH TWICE DAILY 30 tablet 3  . omeprazole (PRILOSEC) 20 MG capsule Take 1 capsule (20 mg total) by mouth daily. 30 capsule 4  . oxyCODONE-acetaminophen (PERCOCET) 5-325 MG tablet Take 2 tablets by mouth every 6 (six) hours as needed. 20 tablet 0  . prochlorperazine (COMPAZINE) 10 MG tablet Take 1 tablet (10 mg total) by mouth every 6 (six) hours as needed for nausea or vomiting. 30 tablet 2  . RELION GLUCOSE TEST STRIPS test strip USE 1 TO CHECK GLUCOSE ONCE DAILY    . ReliOn Ultra Thin Lancets 30G MISC USE 1 TO CHECK GLUCOSE ONCE DAILY FOR GLUCOSE TESTING    . traMADol (ULTRAM) 50 MG tablet Take 50 mg by mouth every 8 (eight) hours as needed.      No current  facility-administered medications for this visit.    SURGICAL HISTORY:  Past Surgical History:  Procedure Laterality Date  . IR IMAGING GUIDED PORT INSERTION  05/10/2019    REVIEW OF SYSTEMS:  Constitutional: positive for fatigue Eyes: negative Ears, nose, mouth, throat, and face: positive for nasal congestion and sore throat Respiratory: positive for cough Cardiovascular: negative Gastrointestinal: negative Genitourinary:negative Integument/breast:  negative Hematologic/lymphatic: negative Musculoskeletal:negative Neurological: negative Behavioral/Psych: negative Endocrine: negative Allergic/Immunologic: negative   PHYSICAL EXAMINATION: General appearance: alert, cooperative, fatigued and no distress Head: Normocephalic, without obvious abnormality, atraumatic Neck: no adenopathy, no JVD, supple, symmetrical, trachea midline and thyroid not enlarged, symmetric, no tenderness/mass/nodules Lymph nodes: Cervical, supraclavicular, and axillary nodes normal. Resp: clear to auscultation bilaterally Back: symmetric, no curvature. ROM normal. No CVA tenderness. Cardio: regular rate and rhythm, S1, S2 normal, no murmur, click, rub or gallop GI: soft, non-tender; bowel sounds normal; no masses,  no organomegaly Extremities: extremities normal, atraumatic, no cyanosis or edema Neurologic: Alert and oriented X 3, normal strength and tone. Normal symmetric reflexes. Normal coordination and gait  ECOG PERFORMANCE STATUS: 1 - Symptomatic but completely ambulatory  Blood pressure (!) 151/80, pulse 73, temperature 98.1 F (36.7 C), temperature source Tympanic, resp. rate 16, height _0  (1.727 m), weight 191 lb 12.8 oz (87 kg), SpO2 100 %.  LABORATORY DATA: Lab Results  Component Value Date   WBC 2.6 (L) 02/22/2020   HGB 11.4 (L) 02/22/2020   HCT 34.8 (L) 02/22/2020   MCV 97.8 02/22/2020   PLT 98 (L) 02/22/2020      Chemistry      Component Value Date/Time   NA 142 02/22/2020 0925   K 4.7 02/22/2020 0925   CL 110 02/22/2020 0925   CO2 26 02/22/2020 0925   BUN 11 02/22/2020 0925   CREATININE 1.08 (H) 02/22/2020 0925      Component Value Date/Time   CALCIUM 8.5 (L) 02/22/2020 0925   ALKPHOS 62 02/22/2020 0925   AST 15 02/22/2020 0925   ALT 11 02/22/2020 0925   BILITOT 0.6 02/22/2020 0925       RADIOGRAPHIC STUDIES: DG Chest 2 View  Result Date: 01/25/2020 CLINICAL DATA:  Cough. EXAM: CHEST - 2 VIEW COMPARISON:  09/28/2017.  FINDINGS: Enlarged cardiac silhouette with globular appearance. Right IJ approach Port-A-Cath with the tip projecting at the superior cavoatrial junction. Linear left basilar opacities. No visible pleural effusions or pneumothorax. No acute osseous abnormality. IMPRESSION: 1. Linear left basilar opacities, which could represent atelectasis, aspiration, and/or pneumonia. 2. Enlarged cardiac silhouette with a globular appearance. This could relate to cardiomegaly and/or pericardial effusion. Consider correlation with an echocardiogram. Electronically Signed   By: Margaretha Sheffield MD   On: 01/25/2020 10:58    ASSESSMENT AND PLAN: This is a very pleasant 69 years old African female originally from [place] who is visiting her son in [place] and was recently diagnosed with IgA multiple myeloma. The patient was a started initially on treatment with weekly subcutaneous Velcade 1.3 mg/M2, Revlimid 25 mg p.o. daily for 21 days every 4 weeks as well as weekly Decadron 40 mg orally.  Revlimid was discontinued secondary to hypersensitivity reaction with significant skin rash.  She is currently on treatment with weekly Velcade and Decadron. Her treatment was discontinued secondary to disease progression.  The patient started treatment with Pomalyst and Decadron but this was discontinued secondary to hypersensitivity reaction to the Pomalyst. She is currently undergoing systemic chemotherapy with carfilzomib, Cytoxan and Decadron status post 10 cycles. The  patient has been tolerating this treatment well with no concerning adverse effects. She had repeat myeloma panel performed recently.  I discussed the lab results with the patient.  Her myeloma panel showed no concerning findings for progression.  I recommended for her to continue her current treatment with the same regiment but will start cycle #11 next week because of her current symptoms of nasal congestion, cough and sore throat. For the upper  respiratory symptoms, we will test the patient for COVID-19 today before resuming her treatment next week. The patient was advised to call immediately if she has any other concerning symptoms in the interval. The patient voices understanding of current disease status and treatment options and is in agreement with the current care plan. All questions were answered. The patient knows to call the clinic with any problems, questions or concerns. We can certainly see the patient much sooner if necessary.  Disclaimer: This note was dictated with voice recognition software. Similar sounding words can inadvertently be transcribed and may not be corrected upon review.

## 2020-02-23 ENCOUNTER — Telehealth: Payer: Self-pay | Admitting: *Deleted

## 2020-02-23 ENCOUNTER — Telehealth: Payer: Self-pay | Admitting: Internal Medicine

## 2020-02-23 ENCOUNTER — Inpatient Hospital Stay: Payer: Self-pay

## 2020-02-23 NOTE — Telephone Encounter (Signed)
-----   Message from [employer]. Tanner, PA-C sent at 02/22/2020  6:13 PM EST ----- Would someone please call this patient on Thursday and let them know that their COVID-19 test was negative. Thanks, Zenaida Niece

## 2020-02-23 NOTE — Telephone Encounter (Signed)
Scheduled appointments per 12/29 los. Spoke to patient's son who is aware of appointments dates and times. Will have updated calendar printed for patient at next visit.

## 2020-02-23 NOTE — Telephone Encounter (Signed)
TCT Halina Andreas for [employer], as the call would not go through on either of pt's phone #s. Message left on identified phone that pt's Covid test is negative. Advised that they could call back with any questions or concerns 2 [phone]

## 2020-03-02 ENCOUNTER — Inpatient Hospital Stay: Payer: Self-pay

## 2020-03-02 ENCOUNTER — Inpatient Hospital Stay: Payer: Self-pay | Attending: Internal Medicine

## 2020-03-02 ENCOUNTER — Other Ambulatory Visit: Payer: Self-pay

## 2020-03-02 VITALS — BP 150/83 | HR 64 | Temp 99.0°F | Resp 18 | Wt 184.0 lb

## 2020-03-02 DIAGNOSIS — Z95828 Presence of other vascular implants and grafts: Secondary | ICD-10-CM

## 2020-03-02 DIAGNOSIS — C9000 Multiple myeloma not having achieved remission: Secondary | ICD-10-CM

## 2020-03-02 DIAGNOSIS — Z5111 Encounter for antineoplastic chemotherapy: Secondary | ICD-10-CM | POA: Insufficient documentation

## 2020-03-02 LAB — CMP (CANCER CENTER ONLY)
ALT: 8 U/L (ref 0–44)
AST: 22 U/L (ref 15–41)
Albumin: 3.7 g/dL (ref 3.5–5.0)
Alkaline Phosphatase: 64 U/L (ref 38–126)
Anion gap: 8 (ref 5–15)
BUN: 15 mg/dL (ref 8–23)
CO2: 22 mmol/L (ref 22–32)
Calcium: 9 mg/dL (ref 8.9–10.3)
Chloride: 109 mmol/L (ref 98–111)
Creatinine: 1.17 mg/dL — ABNORMAL HIGH (ref 0.44–1.00)
GFR, Estimated: 51 mL/min — ABNORMAL LOW (ref >60)
Glucose, Bld: 112 mg/dL — ABNORMAL HIGH (ref 70–99)
Potassium: 4.8 mmol/L (ref 3.5–5.1)
Sodium: 139 mmol/L (ref 135–145)
Total Bilirubin: 0.6 mg/dL (ref 0.3–1.2)
Total Protein: 6.8 g/dL (ref 6.5–8.1)

## 2020-03-02 LAB — CBC WITH DIFFERENTIAL (CANCER CENTER ONLY)
Abs Immature Granulocytes: 0.01 10*3/uL (ref 0.00–0.07)
Basophils Absolute: 0 10*3/uL (ref 0.0–0.1)
Basophils Relative: 1 %
Eosinophils Absolute: 0 10*3/uL (ref 0.0–0.5)
Eosinophils Relative: 1 %
HCT: 37.8 % (ref 36.0–46.0)
Hemoglobin: 12.2 g/dL (ref 12.0–15.0)
Immature Granulocytes: 0 %
Lymphocytes Relative: 26 %
Lymphs Abs: 1.1 10*3/uL (ref 0.7–4.0)
MCH: 31.4 pg (ref 26.0–34.0)
MCHC: 32.3 g/dL (ref 30.0–36.0)
MCV: 97.2 fL (ref 80.0–100.0)
Monocytes Absolute: 0.1 10*3/uL (ref 0.1–1.0)
Monocytes Relative: 2 %
Neutro Abs: 2.9 10*3/uL (ref 1.7–7.7)
Neutrophils Relative %: 70 %
Platelet Count: 104 10*3/uL — ABNORMAL LOW (ref 150–400)
RBC: 3.89 MIL/uL (ref 3.87–5.11)
RDW: 14.4 % (ref 11.5–15.5)
WBC Count: 4.1 10*3/uL (ref 4.0–10.5)
nRBC: 0 % (ref 0.0–0.2)

## 2020-03-02 MED ORDER — PALONOSETRON HCL INJECTION 0.25 MG/5ML
INTRAVENOUS | Status: AC
  Filled 2020-03-02: qty 5

## 2020-03-02 MED ORDER — SODIUM CHLORIDE 0.9% FLUSH
10.0000 mL | INTRAVENOUS | Status: DC | PRN
  Administered 2020-03-02: 10 mL
  Filled 2020-03-02: qty 10

## 2020-03-02 MED ORDER — HEPARIN SOD (PORK) LOCK FLUSH 100 UNIT/ML IV SOLN
500.0000 [IU] | Freq: Once | INTRAVENOUS | Status: AC | PRN
  Administered 2020-03-02: 500 [IU]
  Filled 2020-03-02: qty 5

## 2020-03-02 MED ORDER — PALONOSETRON HCL INJECTION 0.25 MG/5ML
0.2500 mg | Freq: Once | INTRAVENOUS | Status: AC
  Administered 2020-03-02: 0.25 mg via INTRAVENOUS

## 2020-03-02 MED ORDER — SODIUM CHLORIDE 0.9 % IV SOLN
Freq: Once | INTRAVENOUS | Status: AC
  Filled 2020-03-02: qty 250

## 2020-03-02 MED ORDER — SODIUM CHLORIDE 0.9 % IV SOLN
300.0000 mg/m2 | Freq: Once | INTRAVENOUS | Status: AC
  Administered 2020-03-02: 640 mg via INTRAVENOUS
  Filled 2020-03-02: qty 32

## 2020-03-02 MED ORDER — DEXTROSE 5 % IV SOLN
36.0000 mg/m2 | Freq: Once | INTRAVENOUS | Status: AC
  Administered 2020-03-02: 80 mg via INTRAVENOUS
  Filled 2020-03-02: qty 30

## 2020-03-02 MED FILL — ACYCLOVIR 200 MG CAP: 200 | 30 days supply | Qty: 60 | Fill #3

## 2020-03-02 MED FILL — OMEPRAZOLE 20 MG CAP: 20 | 30 days supply | Qty: 30 | Fill #3

## 2020-03-02 NOTE — Patient Instructions (Signed)
[facility] Discharge Instructions for Patients Receiving Chemotherapy  Today you received the following chemotherapy agents carfilzomib, cyclophasmamide.  To help prevent nausea and vomiting after your treatment, we encourage you to take your nausea medication as directed.    If you develop nausea and vomiting that is not controlled by your nausea medication, call the clinic.   BELOW ARE SYMPTOMS THAT SHOULD BE REPORTED IMMEDIATELY:  *FEVER GREATER THAN 100.5 F  *CHILLS WITH OR WITHOUT FEVER  NAUSEA AND VOMITING THAT IS NOT CONTROLLED WITH YOUR NAUSEA MEDICATION  *UNUSUAL SHORTNESS OF BREATH  *UNUSUAL BRUISING OR BLEEDING  TENDERNESS IN MOUTH AND THROAT WITH OR WITHOUT PRESENCE OF ULCERS  *URINARY PROBLEMS  *BOWEL PROBLEMS  UNUSUAL RASH Items with * indicate a potential emergency and should be followed up as soon as possible.  Feel free to call the clinic should you have any questions or concerns. The clinic phone number is [phone].  Please show the CHEMO ALERT CARD at check-in to the Emergency Department and triage nurse.   

## 2020-03-03 ENCOUNTER — Inpatient Hospital Stay: Payer: Self-pay

## 2020-03-03 VITALS — BP 141/76 | HR 76 | Temp 97.5°F | Resp 18

## 2020-03-03 DIAGNOSIS — C9000 Multiple myeloma not having achieved remission: Secondary | ICD-10-CM

## 2020-03-03 MED ORDER — SODIUM CHLORIDE 0.9 % IV SOLN
Freq: Once | INTRAVENOUS | Status: AC
  Filled 2020-03-03: qty 250

## 2020-03-03 MED ORDER — PROCHLORPERAZINE MALEATE 10 MG PO TABS
10.0000 mg | ORAL_TABLET | Freq: Once | ORAL | Status: AC
  Administered 2020-03-03: 10 mg via ORAL

## 2020-03-03 MED ORDER — PROCHLORPERAZINE MALEATE 10 MG PO TABS
ORAL_TABLET | ORAL | Status: AC
  Filled 2020-03-03: qty 1

## 2020-03-03 MED ORDER — HEPARIN SOD (PORK) LOCK FLUSH 100 UNIT/ML IV SOLN
500.0000 [IU] | Freq: Once | INTRAVENOUS | Status: AC | PRN
  Administered 2020-03-03: 500 [IU]
  Filled 2020-03-03: qty 5

## 2020-03-03 MED ORDER — DEXTROSE 5 % IV SOLN
36.0000 mg/m2 | Freq: Once | INTRAVENOUS | Status: AC
  Administered 2020-03-03: 80 mg via INTRAVENOUS
  Filled 2020-03-03: qty 30

## 2020-03-03 MED ORDER — SODIUM CHLORIDE 0.9% FLUSH
10.0000 mL | INTRAVENOUS | Status: DC | PRN
  Administered 2020-03-03: 10 mL
  Filled 2020-03-03: qty 10

## 2020-03-03 NOTE — Patient Instructions (Signed)
[facility] Discharge Instructions for Patients Receiving Chemotherapy  Today you received the following chemotherapy agents: kyprolis  To help prevent nausea and vomiting after your treatment, we encourage you to take your nausea medication as directed.    If you develop nausea and vomiting that is not controlled by your nausea medication, call the clinic.   BELOW ARE SYMPTOMS THAT SHOULD BE REPORTED IMMEDIATELY:  *FEVER GREATER THAN 100.5 F  *CHILLS WITH OR WITHOUT FEVER  NAUSEA AND VOMITING THAT IS NOT CONTROLLED WITH YOUR NAUSEA MEDICATION  *UNUSUAL SHORTNESS OF BREATH  *UNUSUAL BRUISING OR BLEEDING  TENDERNESS IN MOUTH AND THROAT WITH OR WITHOUT PRESENCE OF ULCERS  *URINARY PROBLEMS  *BOWEL PROBLEMS  UNUSUAL RASH Items with * indicate a potential emergency and should be followed up as soon as possible.  Feel free to call the clinic should you have any questions or concerns. The clinic phone number is [phone].  Please show the CHEMO ALERT CARD at check-in to the Emergency Department and triage nurse.  Influenza Virus Vaccine injection What is this medicine? INFLUENZA VIRUS VACCINE (in floo EN zuh VAHY ruhs vak SEEN) helps to reduce the risk of getting influenza also known as the flu. The vaccine only helps protect you against some strains of the flu. This medicine may be used for other purposes; ask your health care provider or pharmacist if you have questions. COMMON BRAND NAME(S): Afluria, Afluria Quadrivalent, Agriflu, Alfuria, FLUAD, Fluarix, Fluarix Quadrivalent, Flublok, Flublok Quadrivalent, FLUCELVAX, FLUCELVAX Quadrivalent, Flulaval, Flulaval Quadrivalent, Fluvirin, Fluzone, Fluzone High-Dose, Fluzone Intradermal, Fluzone Quadrivalent What should I tell my health care provider before I take this medicine? They need to know if you have any of these conditions:  bleeding disorder like hemophilia  fever or infection  Guillain-Barre syndrome  or other neurological problems  immune system problems  infection with the human immunodeficiency virus (HIV) or AIDS  low blood platelet counts  multiple sclerosis  an unusual or allergic reaction to influenza virus vaccine, latex, other medicines, foods, dyes, or preservatives. Different brands of vaccines contain different allergens. Some may contain latex or eggs. Talk to your doctor about your allergies to make sure that you get the right vaccine.  pregnant or trying to get pregnant  breast-feeding How should I use this medicine? This vaccine is for injection into a muscle or under the skin. It is given by a health care professional. A copy of Vaccine Information Statements will be given before each vaccination. Read this sheet carefully each time. The sheet may change frequently. Talk to your healthcare provider to see which vaccines are right for you. Some vaccines should not be used in all age groups. Overdosage: If you think you have taken too much of this medicine contact a poison control center or emergency room at once. NOTE: This medicine is only for you. Do not share this medicine with others. What if I miss a dose? This does not apply. What may interact with this medicine?  chemotherapy or radiation therapy  medicines that lower your immune system like etanercept, anakinra, infliximab, and adalimumab  medicines that treat or prevent blood clots like warfarin  phenytoin  steroid medicines like prednisone or cortisone  theophylline  vaccines This list may not describe all possible interactions. Give your health care provider a list of all the medicines, herbs, non-prescription drugs, or dietary supplements you use. Also tell them if you smoke, drink alcohol, or use illegal drugs. Some items may interact with your medicine. What   should I watch for while using this medicine? Report any side effects that do not go away within 3 days to your doctor or health care  professional. Call your health care provider if any unusual symptoms occur within 6 weeks of receiving this vaccine. You may still catch the flu, but the illness is not usually as bad. You cannot get the flu from the vaccine. The vaccine will not protect against colds or other illnesses that may cause fever. The vaccine is needed every year. What side effects may I notice from receiving this medicine? Side effects that you should report to your doctor or health care professional as soon as possible:  allergic reactions like skin rash, itching or hives, swelling of the face, lips, or tongue Side effects that usually do not require medical attention (report to your doctor or health care professional if they continue or are bothersome):  fever  headache  muscle aches and pains  pain, tenderness, redness, or swelling at the injection site  tiredness This list may not describe all possible side effects. Call your doctor for medical advice about side effects. You may report side effects to FDA at 1-800-FDA-1088. Where should I keep my medicine? The vaccine will be given by a health care professional in a clinic, pharmacy, doctor's office, or other health care setting. You will not be given vaccine doses to store at home. NOTE: This sheet is a summary. It may not cover all possible information. If you have questions about this medicine, talk to your doctor, pharmacist, or health care provider.  2020 Elsevier/Gold Standard (2018-01-05 08:45:43)

## 2020-03-08 ENCOUNTER — Other Ambulatory Visit: Payer: Self-pay

## 2020-03-08 ENCOUNTER — Inpatient Hospital Stay: Payer: Self-pay

## 2020-03-08 VITALS — BP 172/97 | HR 67 | Temp 98.6°F | Resp 18 | Wt 187.0 lb

## 2020-03-08 DIAGNOSIS — C9000 Multiple myeloma not having achieved remission: Secondary | ICD-10-CM

## 2020-03-08 DIAGNOSIS — Z95828 Presence of other vascular implants and grafts: Secondary | ICD-10-CM

## 2020-03-08 LAB — CMP (CANCER CENTER ONLY)
ALT: 12 U/L (ref 0–44)
AST: 16 U/L (ref 15–41)
Albumin: 3.6 g/dL (ref 3.5–5.0)
Alkaline Phosphatase: 70 U/L (ref 38–126)
Anion gap: 8 (ref 5–15)
BUN: 24 mg/dL — ABNORMAL HIGH (ref 8–23)
CO2: 18 mmol/L — ABNORMAL LOW (ref 22–32)
Calcium: 8.4 mg/dL — ABNORMAL LOW (ref 8.9–10.3)
Chloride: 109 mmol/L (ref 98–111)
Creatinine: 1.26 mg/dL — ABNORMAL HIGH (ref 0.44–1.00)
GFR, Estimated: 46 mL/min — ABNORMAL LOW (ref >60)
Glucose, Bld: 125 mg/dL — ABNORMAL HIGH (ref 70–99)
Potassium: 5.2 mmol/L — ABNORMAL HIGH (ref 3.5–5.1)
Sodium: 135 mmol/L (ref 135–145)
Total Bilirubin: 0.7 mg/dL (ref 0.3–1.2)
Total Protein: 6.2 g/dL — ABNORMAL LOW (ref 6.5–8.1)

## 2020-03-08 LAB — CBC WITH DIFFERENTIAL (CANCER CENTER ONLY)
Abs Immature Granulocytes: 0.02 10*3/uL (ref 0.00–0.07)
Basophils Absolute: 0 10*3/uL (ref 0.0–0.1)
Basophils Relative: 0 %
Eosinophils Absolute: 0 10*3/uL (ref 0.0–0.5)
Eosinophils Relative: 0 %
HCT: 33.2 % — ABNORMAL LOW (ref 36.0–46.0)
Hemoglobin: 11.1 g/dL — ABNORMAL LOW (ref 12.0–15.0)
Immature Granulocytes: 1 %
Lymphocytes Relative: 11 %
Lymphs Abs: 0.5 10*3/uL — ABNORMAL LOW (ref 0.7–4.0)
MCH: 31.5 pg (ref 26.0–34.0)
MCHC: 33.4 g/dL (ref 30.0–36.0)
MCV: 94.3 fL (ref 80.0–100.0)
Monocytes Absolute: 0.1 10*3/uL (ref 0.1–1.0)
Monocytes Relative: 2 %
Neutro Abs: 3.5 10*3/uL (ref 1.7–7.7)
Neutrophils Relative %: 86 %
Platelet Count: 37 10*3/uL — ABNORMAL LOW (ref 150–400)
RBC: 3.52 MIL/uL — ABNORMAL LOW (ref 3.87–5.11)
RDW: 14.2 % (ref 11.5–15.5)
WBC Count: 4.1 10*3/uL (ref 4.0–10.5)
nRBC: 1 % — ABNORMAL HIGH (ref 0.0–0.2)

## 2020-03-08 MED ORDER — HEPARIN SOD (PORK) LOCK FLUSH 100 UNIT/ML IV SOLN
500.0000 [IU] | Freq: Once | INTRAVENOUS | Status: AC
  Administered 2020-03-08: 500 [IU] via INTRAVENOUS
  Filled 2020-03-08: qty 5

## 2020-03-08 MED ORDER — SODIUM CHLORIDE 0.9% FLUSH
10.0000 mL | INTRAVENOUS | Status: DC | PRN
  Administered 2020-03-08: 10 mL
  Filled 2020-03-08: qty 10

## 2020-03-08 MED ORDER — SODIUM CHLORIDE 0.9% FLUSH
10.0000 mL | Freq: Once | INTRAVENOUS | Status: AC
  Administered 2020-03-08: 10 mL via INTRAVENOUS
  Filled 2020-03-08: qty 10

## 2020-03-08 MED ORDER — SODIUM CHLORIDE 0.9 % IV SOLN
Freq: Once | INTRAVENOUS | Status: AC
  Filled 2020-03-08: qty 250

## 2020-03-08 NOTE — Progress Notes (Signed)
Platelet count 37 today. Dr. Julien Nordmann notified. Order given to Hold treatment this week. Pt notified per Apolonio Schneiders RN

## 2020-03-08 NOTE — Patient Instructions (Signed)
Rehydration, Adult Rehydration is the replacement of body fluids, salts, and minerals (electrolytes) that are lost during dehydration. Dehydration is when there is not enough water or other fluids in the body. This happens when you lose more fluids than you take in. Common causes of dehydration include:  Not drinking enough fluids. This can occur when you are ill or doing activities that require a lot of energy, especially in hot weather.  Conditions that cause loss of water or other fluids, such as diarrhea, vomiting, sweating, or urinating a lot.  Other illnesses, such as fever or infection.  Certain medicines, such as those that remove excess fluid from the body (diuretics). Symptoms of mild or moderate dehydration may include thirst, dry lips and mouth, and dizziness. Symptoms of severe dehydration may include increased heart rate, confusion, fainting, and not urinating. For severe dehydration, you may need to get fluids through an IV at the hospital. For mild or moderate dehydration, you can usually rehydrate at home by drinking certain fluids as told by your health care provider. What are the risks? Generally, rehydration is safe. However, taking in too much fluid (overhydration) can be a problem. This is rare. Overhydration can cause an electrolyte imbalance, kidney failure, or a decrease in salt (sodium) levels in the body. Supplies needed You will need an oral rehydration solution (ORS) if your health care provider tells you to use one. This is a drink to treat dehydration. It can be found in pharmacies and retail stores. How to rehydrate Fluids Follow instructions from your health care provider for rehydration. The kind of fluid and the amount you should drink depend on your condition. In general, you should choose drinks that you prefer.  If told by your health care provider, drink an ORS. ? Make an ORS by following instructions on the package. ? Start by drinking small amounts,  about  cup (120 mL) every 5-10 minutes. ? Slowly increase how much you drink until you have taken the amount recommended by your health care provider.  Drink enough clear fluids to keep your urine pale yellow. If you were told to drink an ORS, finish it first, then start slowly drinking other clear fluids. Drink fluids such as: ? Water. This includes sparkling water and flavored water. Drinking only water can lead to having too little sodium in your body (hyponatremia). Follow the advice of your health care provider. ? Water from ice chips you suck on. ? Fruit juice with water you add to it (diluted). ? Sports drinks. ? Hot or cold herbal teas. ? Broth-based soups. ? Milk or milk products. Food Follow instructions from your health care provider about what to eat while you rehydrate. Your health care provider may recommend that you slowly begin eating regular foods in small amounts.  Eat foods that contain a healthy balance of electrolytes, such as bananas, oranges, potatoes, tomatoes, and spinach.  Avoid foods that are greasy or contain a lot of sugar. In some cases, you may get nutrition through a feeding tube that is passed through your nose and into your stomach (nasogastric tube, or NG tube). This may be done if you have uncontrolled vomiting or diarrhea.   Beverages to avoid Certain beverages may make dehydration worse. While you rehydrate, avoid drinking alcohol.   How to tell if you are recovering from dehydration You may be recovering from dehydration if:  You are urinating more often than before you started rehydrating.  Your urine is pale yellow.  Your energy level   improves.  You vomit less frequently.  You have diarrhea less frequently.  Your appetite improves or returns to normal.  You feel less dizzy or less light-headed.  Your skin tone and color start to look more normal. Follow these instructions at home:  Take over-the-counter and prescription medicines only  as told by your health care provider.  Do not take sodium tablets. Doing this can lead to having too much sodium in your body (hypernatremia). Contact a health care provider if:  You continue to have symptoms of mild or moderate dehydration, such as: ? Thirst. ? Dry lips. ? Slightly dry mouth. ? Dizziness. ? Dark urine or less urine than normal. ? Muscle cramps.  You continue to vomit or have diarrhea. Get help right away if you:  Have symptoms of dehydration that get worse.  Have a fever.  Have a severe headache.  Have been vomiting and the following happens: ? Your vomiting gets worse or does not go away. ? Your vomit includes blood or green matter (bile). ? You cannot eat or drink without vomiting.  Have problems with urination or bowel movements, such as: ? Diarrhea that gets worse or does not go away. ? Blood in your stool (feces). This may cause stool to look black and tarry. ? Not urinating, or urinating only a small amount of very dark urine, within 6-8 hours.  Have trouble breathing.  Have symptoms that get worse with treatment. These symptoms may represent a serious problem that is an emergency. Do not wait to see if the symptoms will go away. Get medical help right away. Call your local emergency services (911 in the [place] Do not drive yourself to the hospital. Summary  Rehydration is the replacement of body fluids and minerals (electrolytes) that are lost during dehydration.  Follow instructions from your health care provider for rehydration. The kind of fluid and amount you should drink depend on your condition.  Slowly increase how much you drink until you have taken the amount recommended by your health care provider.  Contact your health care provider if you continue to show signs of mild or moderate dehydration. This information is not intended to replace advice given to you by your health care provider. Make sure you discuss any questions you have with  your health care provider. Document Revised: 04/13/2019 Document Reviewed: 02/21/2019 Elsevier Patient Education  2021 Elsevier Inc.  

## 2020-03-08 NOTE — Progress Notes (Signed)
This RN notified patient of MD decision to hold treatment. Patient reported that she has been feeling weak, having 10 BMs per day, and her head hurts when she coughs. MD notified of patient's symptoms. Dr. Julien Nordmann advised patient receive 1L IV fluids in placement of treatment today. If symptoms do not improve he will visit patient in infusion room.   Upon departure of infusion clinic, patient reported that she felt better. RN confirmed with patient that she will not be receiving treatment for the rest of the week, patient verbalized understanding. Patient ambulatory, vitals stable and no complaints upon departure of infusion room.

## 2020-03-09 ENCOUNTER — Inpatient Hospital Stay: Payer: Self-pay

## 2020-03-14 ENCOUNTER — Inpatient Hospital Stay: Payer: Self-pay

## 2020-03-14 ENCOUNTER — Other Ambulatory Visit: Payer: Self-pay

## 2020-03-14 VITALS — BP 165/102 | HR 63 | Temp 98.0°F | Resp 16

## 2020-03-14 DIAGNOSIS — Z95828 Presence of other vascular implants and grafts: Secondary | ICD-10-CM

## 2020-03-14 DIAGNOSIS — C9000 Multiple myeloma not having achieved remission: Secondary | ICD-10-CM

## 2020-03-14 LAB — CBC WITH DIFFERENTIAL (CANCER CENTER ONLY)
Abs Immature Granulocytes: 0.01 10*3/uL (ref 0.00–0.07)
Basophils Absolute: 0 10*3/uL (ref 0.0–0.1)
Basophils Relative: 1 %
Eosinophils Absolute: 0.1 10*3/uL (ref 0.0–0.5)
Eosinophils Relative: 3 %
HCT: 34.7 % — ABNORMAL LOW (ref 36.0–46.0)
Hemoglobin: 11.2 g/dL — ABNORMAL LOW (ref 12.0–15.0)
Immature Granulocytes: 0 %
Lymphocytes Relative: 13 %
Lymphs Abs: 0.3 10*3/uL — ABNORMAL LOW (ref 0.7–4.0)
MCH: 31.5 pg (ref 26.0–34.0)
MCHC: 32.3 g/dL (ref 30.0–36.0)
MCV: 97.5 fL (ref 80.0–100.0)
Monocytes Absolute: 0.2 10*3/uL (ref 0.1–1.0)
Monocytes Relative: 9 %
Neutro Abs: 1.8 10*3/uL (ref 1.7–7.7)
Neutrophils Relative %: 74 %
Platelet Count: 67 10*3/uL — ABNORMAL LOW (ref 150–400)
RBC: 3.56 MIL/uL — ABNORMAL LOW (ref 3.87–5.11)
RDW: 16.1 % — ABNORMAL HIGH (ref 11.5–15.5)
WBC Count: 2.4 10*3/uL — ABNORMAL LOW (ref 4.0–10.5)
nRBC: 0 % (ref 0.0–0.2)

## 2020-03-14 LAB — CMP (CANCER CENTER ONLY)
ALT: 11 U/L (ref 0–44)
AST: 17 U/L (ref 15–41)
Albumin: 3.5 g/dL (ref 3.5–5.0)
Alkaline Phosphatase: 63 U/L (ref 38–126)
Anion gap: 8 (ref 5–15)
BUN: 16 mg/dL (ref 8–23)
CO2: 23 mmol/L (ref 22–32)
Calcium: 8.4 mg/dL — ABNORMAL LOW (ref 8.9–10.3)
Chloride: 108 mmol/L (ref 98–111)
Creatinine: 1.24 mg/dL — ABNORMAL HIGH (ref 0.44–1.00)
GFR, Estimated: 47 mL/min — ABNORMAL LOW (ref >60)
Glucose, Bld: 117 mg/dL — ABNORMAL HIGH (ref 70–99)
Potassium: 4.5 mmol/L (ref 3.5–5.1)
Sodium: 139 mmol/L (ref 135–145)
Total Bilirubin: 0.7 mg/dL (ref 0.3–1.2)
Total Protein: 5.6 g/dL — ABNORMAL LOW (ref 6.5–8.1)

## 2020-03-14 MED ORDER — SODIUM CHLORIDE 0.9 % IV SOLN
Freq: Once | INTRAVENOUS | Status: AC
  Filled 2020-03-14: qty 250

## 2020-03-14 MED ORDER — PALONOSETRON HCL INJECTION 0.25 MG/5ML
INTRAVENOUS | Status: AC
  Filled 2020-03-14: qty 5

## 2020-03-14 MED ORDER — SODIUM CHLORIDE 0.9 % IV SOLN
300.0000 mg/m2 | Freq: Once | INTRAVENOUS | Status: AC
  Administered 2020-03-14: 640 mg via INTRAVENOUS
  Filled 2020-03-14: qty 32

## 2020-03-14 MED ORDER — DEXTROSE 5 % IV SOLN
36.0000 mg/m2 | Freq: Once | INTRAVENOUS | Status: AC
  Administered 2020-03-14: 70 mg via INTRAVENOUS
  Filled 2020-03-14: qty 30

## 2020-03-14 MED ORDER — SODIUM CHLORIDE 0.9% FLUSH
10.0000 mL | INTRAVENOUS | Status: DC | PRN
  Administered 2020-03-14: 10 mL
  Filled 2020-03-14: qty 10

## 2020-03-14 MED ORDER — CARFILZOMIB CHEMO INJECTION 60 MG
36.0000 mg/m2 | Freq: Once | INTRAVENOUS | Status: DC
  Filled 2020-03-14: qty 40

## 2020-03-14 MED ORDER — PALONOSETRON HCL INJECTION 0.25 MG/5ML
0.2500 mg | Freq: Once | INTRAVENOUS | Status: AC
  Administered 2020-03-14: 0.25 mg via INTRAVENOUS

## 2020-03-14 MED ORDER — HEPARIN SOD (PORK) LOCK FLUSH 100 UNIT/ML IV SOLN
500.0000 [IU] | Freq: Once | INTRAVENOUS | Status: AC | PRN
  Administered 2020-03-14: 500 [IU]
  Filled 2020-03-14: qty 5

## 2020-03-14 NOTE — Progress Notes (Signed)
Per Dr Julien Nordmann ,it is okay to treat Debbie Chan today with Cytoxan and Kyprolis and platelets of 67K.

## 2020-03-14 NOTE — Patient Instructions (Signed)
[facility] Discharge Instructions for Patients Receiving Chemotherapy  Today you received the following chemotherapy agents carfilzomib, cyclophasmamide.  To help prevent nausea and vomiting after your treatment, we encourage you to take your nausea medication as directed.    If you develop nausea and vomiting that is not controlled by your nausea medication, call the clinic.   BELOW ARE SYMPTOMS THAT SHOULD BE REPORTED IMMEDIATELY:  *FEVER GREATER THAN 100.5 F  *CHILLS WITH OR WITHOUT FEVER  NAUSEA AND VOMITING THAT IS NOT CONTROLLED WITH YOUR NAUSEA MEDICATION  *UNUSUAL SHORTNESS OF BREATH  *UNUSUAL BRUISING OR BLEEDING  TENDERNESS IN MOUTH AND THROAT WITH OR WITHOUT PRESENCE OF ULCERS  *URINARY PROBLEMS  *BOWEL PROBLEMS  UNUSUAL RASH Items with * indicate a potential emergency and should be followed up as soon as possible.  Feel free to call the clinic should you have any questions or concerns. The clinic phone number is [phone].  Please show the CHEMO ALERT CARD at check-in to the Emergency Department and triage nurse.   

## 2020-03-14 NOTE — Progress Notes (Signed)
Pt lost weight and thrombocytopenic; MD reducing dose of Kyprolis.  Kennith Center, Pharm.D., CPP 03/14/2020@11 :26 AM

## 2020-03-14 NOTE — Addendum Note (Signed)
Addended by: Tora Kindred on: 03/14/2020 03:29 PM   Modules accepted: Orders

## 2020-03-15 ENCOUNTER — Inpatient Hospital Stay: Payer: Self-pay

## 2020-03-15 ENCOUNTER — Other Ambulatory Visit: Payer: Self-pay

## 2020-03-15 ENCOUNTER — Other Ambulatory Visit: Payer: Self-pay | Admitting: Internal Medicine

## 2020-03-15 VITALS — BP 146/82 | HR 68 | Temp 98.6°F | Resp 18

## 2020-03-15 DIAGNOSIS — C9000 Multiple myeloma not having achieved remission: Secondary | ICD-10-CM

## 2020-03-15 MED ORDER — PROCHLORPERAZINE MALEATE 10 MG PO TABS
ORAL_TABLET | ORAL | Status: AC
  Filled 2020-03-15: qty 1

## 2020-03-15 MED ORDER — SODIUM CHLORIDE 0.9 % IV SOLN
Freq: Once | INTRAVENOUS | Status: AC
  Filled 2020-03-15: qty 250

## 2020-03-15 MED ORDER — SODIUM CHLORIDE 0.9% FLUSH
10.0000 mL | INTRAVENOUS | Status: DC | PRN
  Administered 2020-03-15: 10 mL
  Filled 2020-03-15: qty 10

## 2020-03-15 MED ORDER — DEXTROSE 5 % IV SOLN
36.0000 mg/m2 | Freq: Once | INTRAVENOUS | Status: AC
  Administered 2020-03-15: 70 mg via INTRAVENOUS
  Filled 2020-03-15: qty 30

## 2020-03-15 MED ORDER — HEPARIN SOD (PORK) LOCK FLUSH 100 UNIT/ML IV SOLN
500.0000 [IU] | Freq: Once | INTRAVENOUS | Status: AC | PRN
  Administered 2020-03-15: 500 [IU]
  Filled 2020-03-15: qty 5

## 2020-03-15 MED ORDER — PROCHLORPERAZINE MALEATE 10 MG PO TABS
10.0000 mg | ORAL_TABLET | Freq: Once | ORAL | Status: AC
  Administered 2020-03-15: 10 mg via ORAL

## 2020-03-15 MED FILL — DEXAMETHASONE 4 MG TABLET: 4 | 28 days supply | Qty: 40 | Fill #2

## 2020-03-15 NOTE — Patient Instructions (Signed)
[facility] for Patients Receiving Chemotherapy  Today you received the following chemotherapy agents: carfilzomib.  To help prevent nausea and vomiting after your treatment, we encourage you to take your nausea medication as directed.   If you develop nausea and vomiting that is not controlled by your nausea medication, call the clinic.   BELOW ARE SYMPTOMS THAT SHOULD BE REPORTED IMMEDIATELY:  *FEVER GREATER THAN 100.5 F  *CHILLS WITH OR WITHOUT FEVER  NAUSEA AND VOMITING THAT IS NOT CONTROLLED WITH YOUR NAUSEA MEDICATION  *UNUSUAL SHORTNESS OF BREATH  *UNUSUAL BRUISING OR BLEEDING  TENDERNESS IN MOUTH AND THROAT WITH OR WITHOUT PRESENCE OF ULCERS  *URINARY PROBLEMS  *BOWEL PROBLEMS  UNUSUAL RASH Items with * indicate a potential emergency and should be followed up as soon as possible.  Feel free to call the clinic should you have any questions or concerns. The clinic phone number is [phone].  Please show the CHEMO ALERT CARD at check-in to the Emergency Department and triage nurse.   

## 2020-03-16 ENCOUNTER — Telehealth: Payer: Self-pay | Admitting: Internal Medicine

## 2020-03-16 NOTE — Telephone Encounter (Signed)
Called pt per 1/20 staff message with MD - changed appts to match treatment plan. - unable to reach pt - called daughter and left message with appt date and time

## 2020-03-17 IMAGING — DX DG HIP (WITH OR WITHOUT PELVIS) 1V*R*
3 series · 3 of 3 positions shown · non-contrast
Comparison: None.

CLINICAL DATA: Bilateral hip pain without known injury.

EXAM:
DG HIP (WITH OR WITHOUT PELVIS) 1V RIGHT

[hip ap]
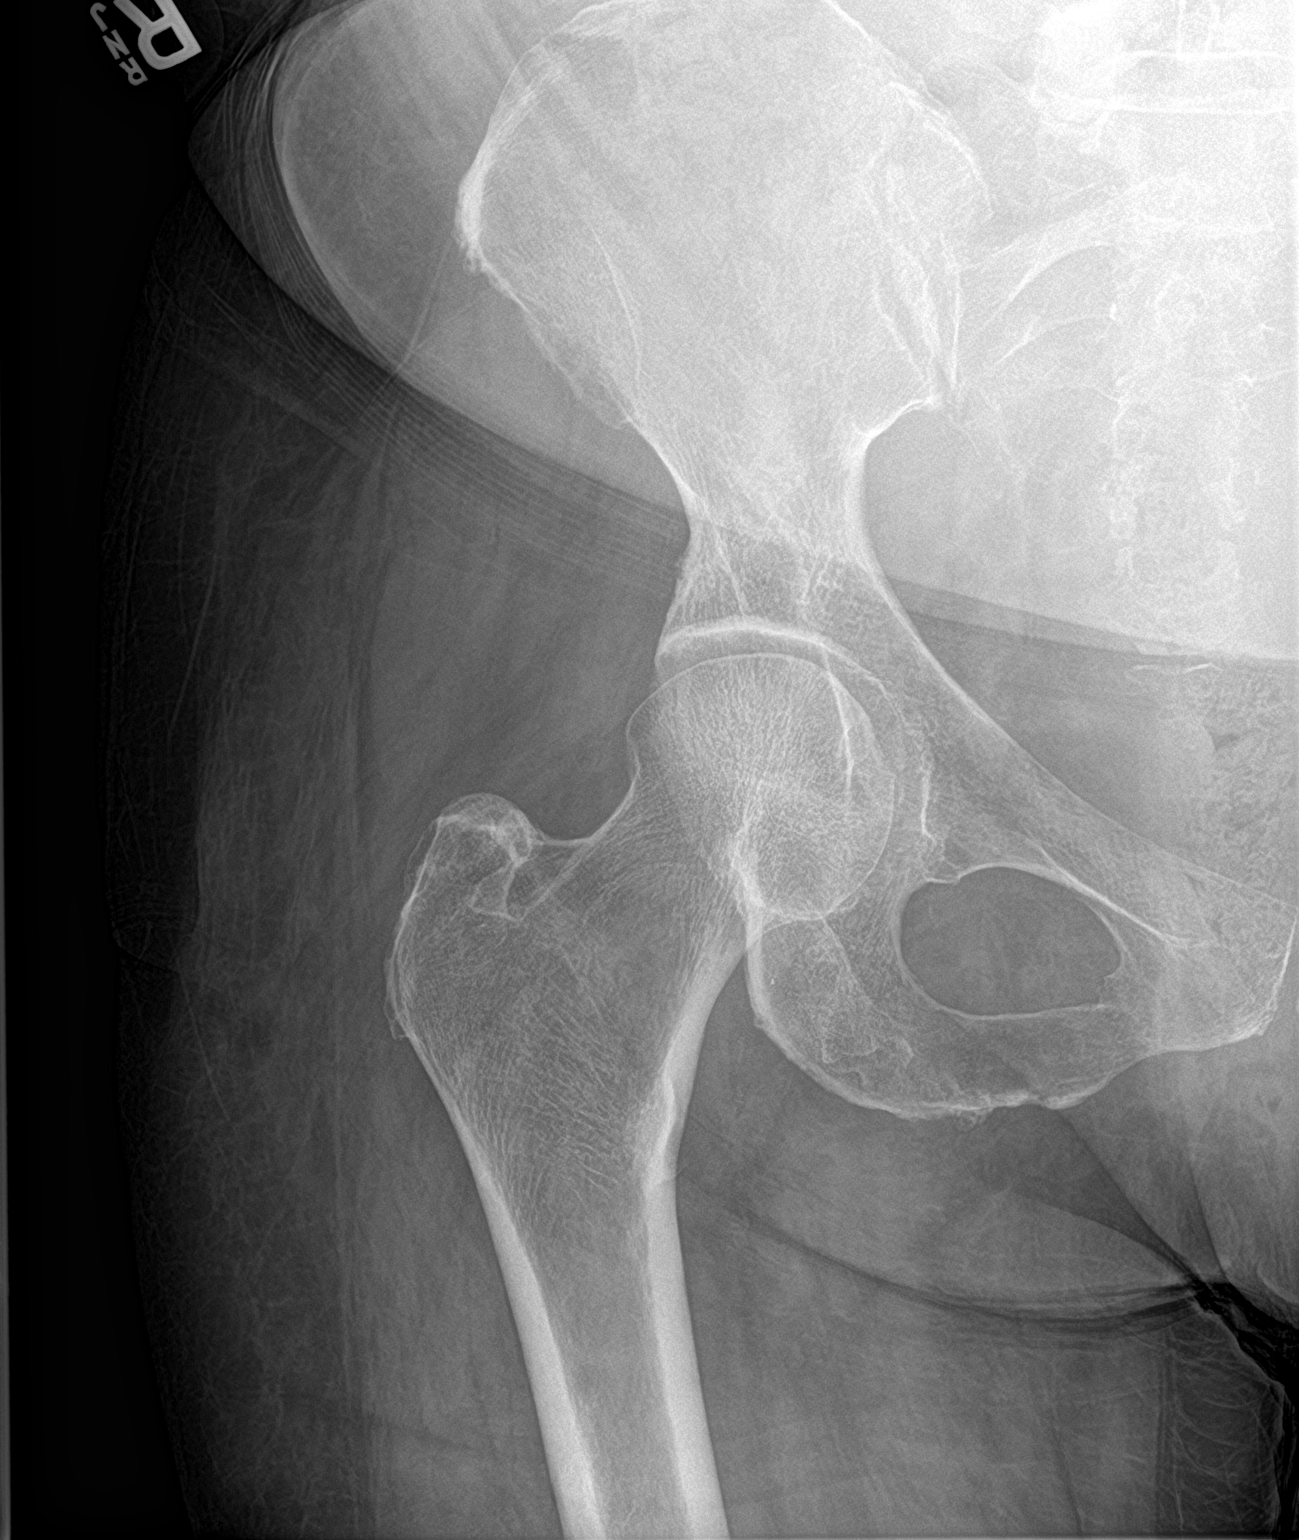

[hip lat]
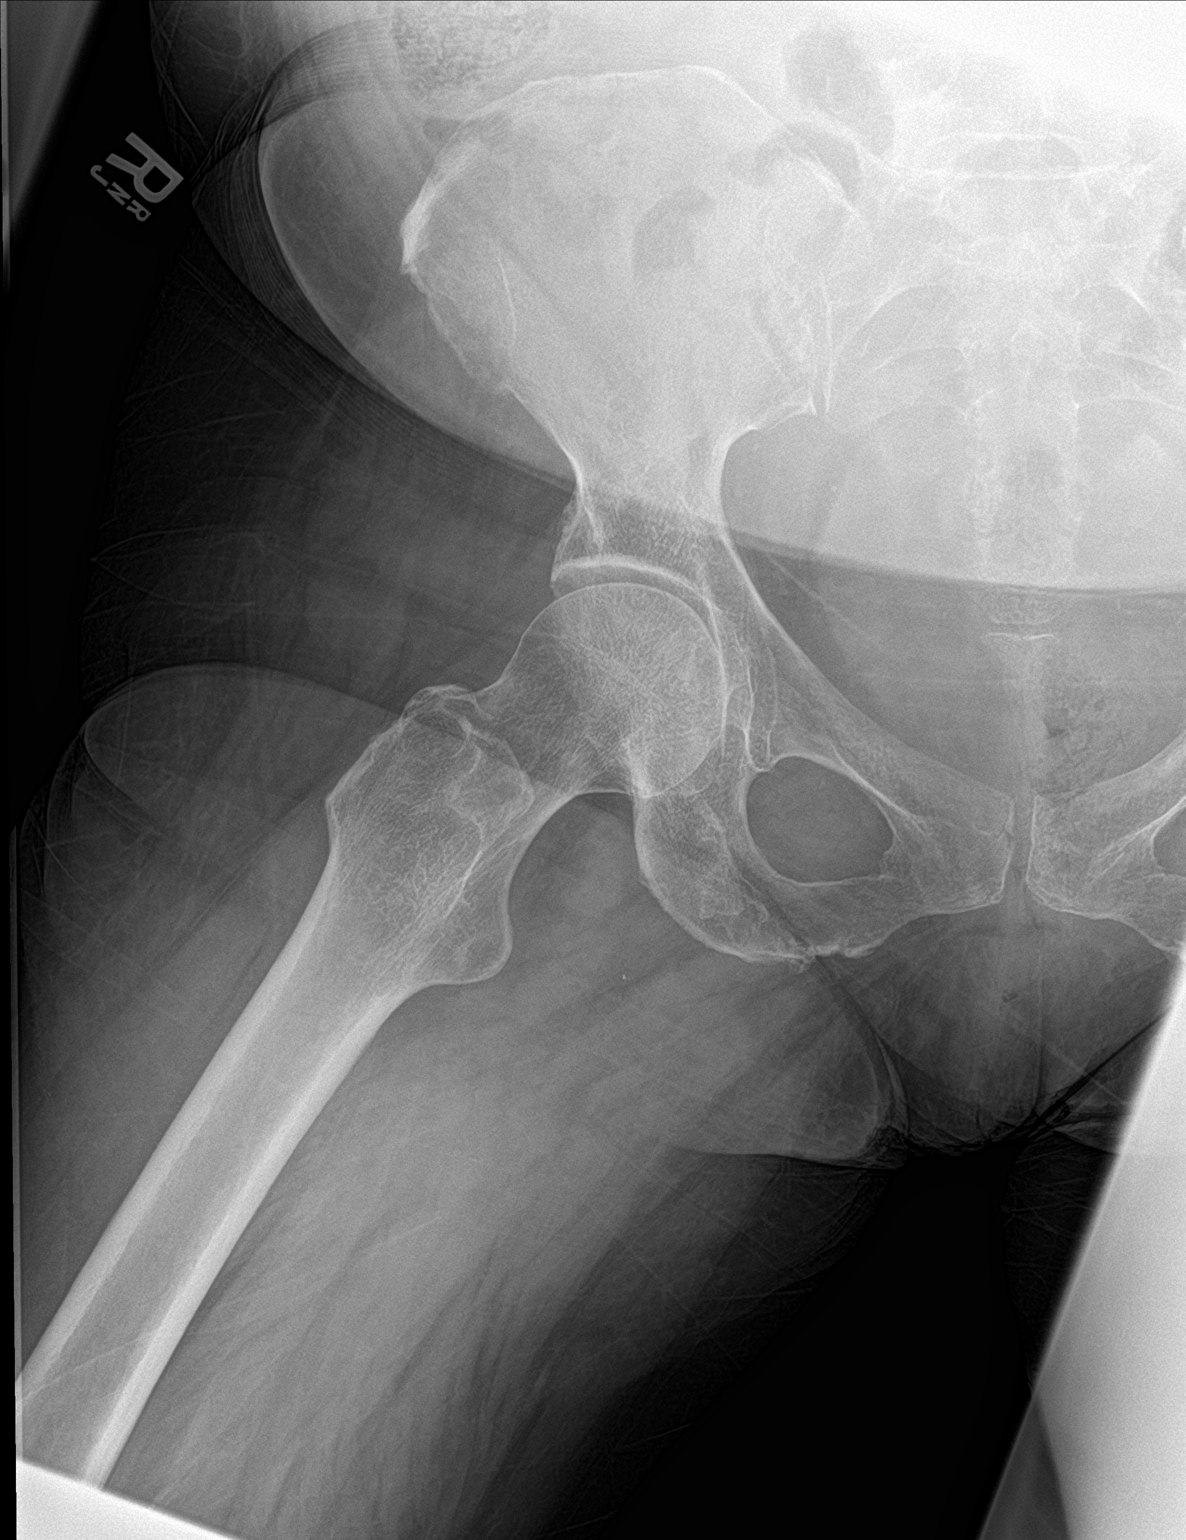

[pelvis ap]
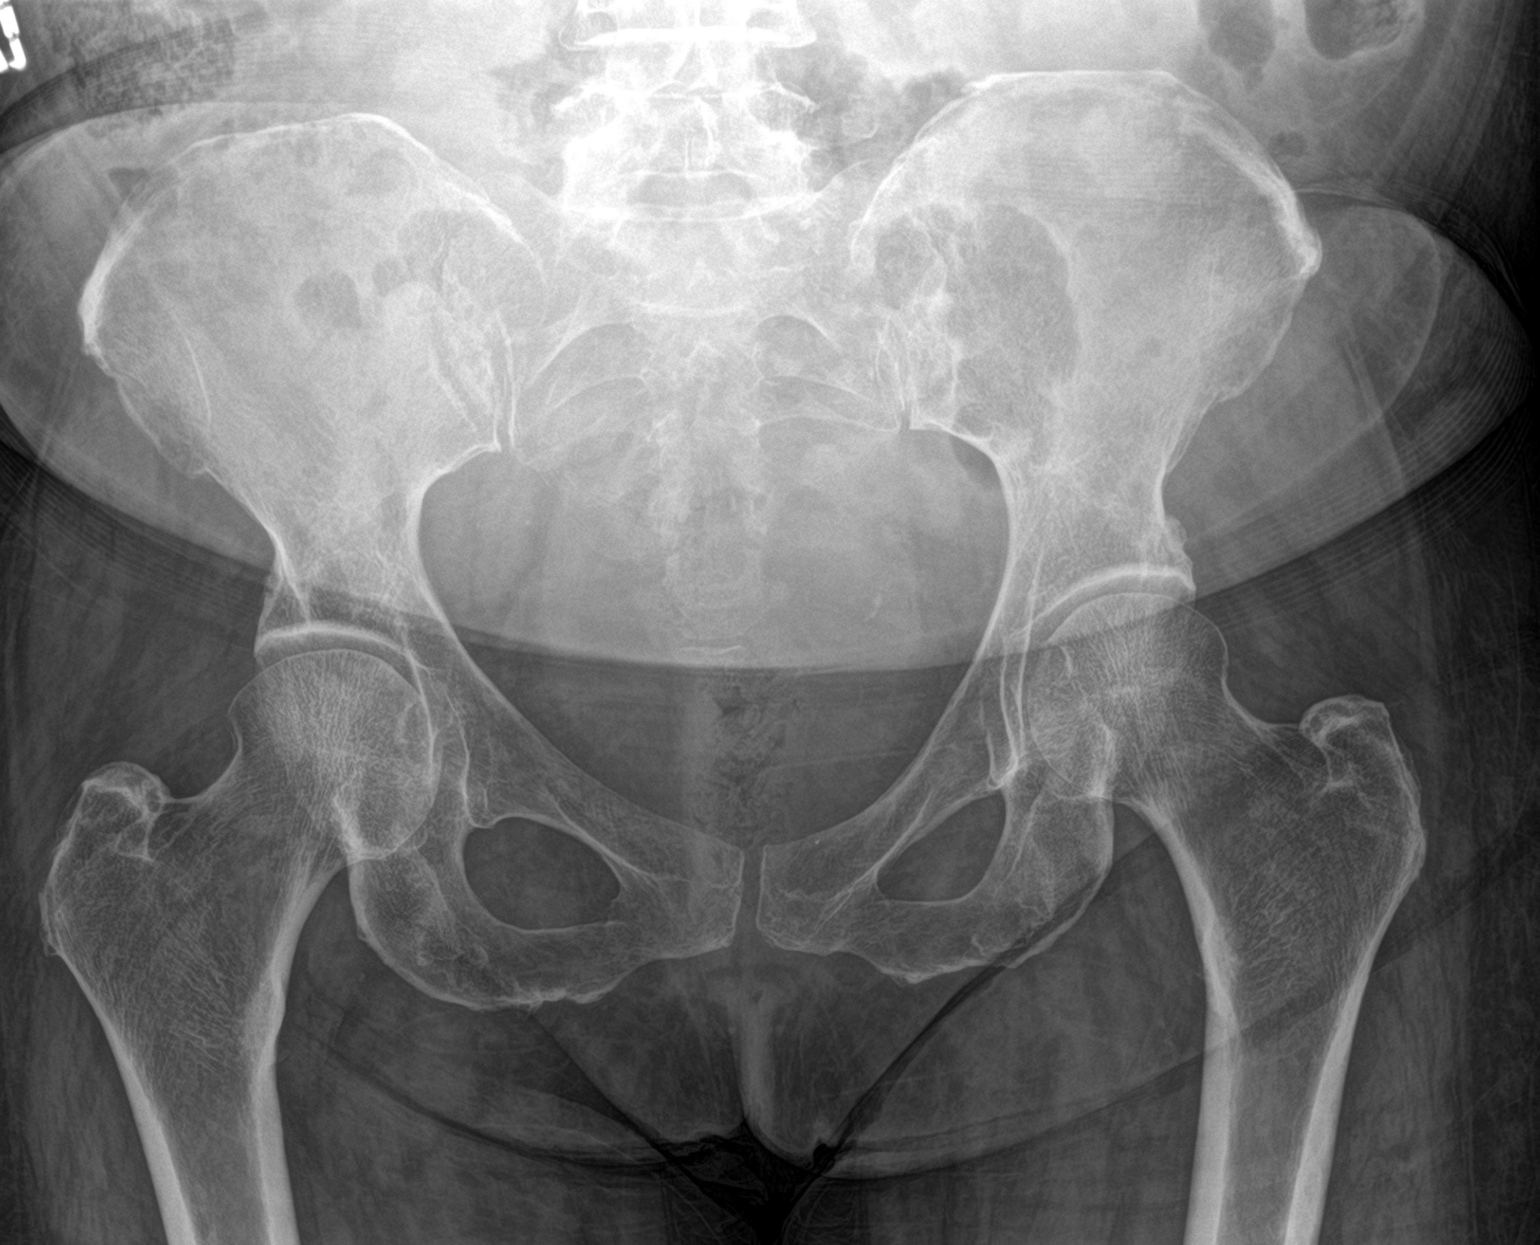

[3 of 3 positions shown; findings below may reference images not displayed]

FINDINGS: There is no evidence of hip fracture or dislocation. There is no
evidence of arthropathy or other focal bone abnormality.
IMPRESSION: Negative.

## 2020-03-18 ENCOUNTER — Inpatient Hospital Stay (HOSPITAL_COMMUNITY)
Admission: EM | Admit: 2020-03-18 | Discharge: 2020-04-04 | DRG: 682 | Disposition: A | Discharge status: Home / Self Care | Payer: Self-pay | Attending: Family Medicine | Admitting: Family Medicine

## 2020-03-18 ENCOUNTER — Other Ambulatory Visit: Payer: Self-pay

## 2020-03-18 ENCOUNTER — Encounter (HOSPITAL_COMMUNITY): Payer: Self-pay | Admitting: Obstetrics and Gynecology

## 2020-03-18 ENCOUNTER — Emergency Department (HOSPITAL_COMMUNITY): Payer: Self-pay

## 2020-03-18 DIAGNOSIS — E871 Hypo-osmolality and hyponatremia: Secondary | ICD-10-CM | POA: Diagnosis present

## 2020-03-18 DIAGNOSIS — C9000 Multiple myeloma not having achieved remission: Secondary | ICD-10-CM | POA: Diagnosis present

## 2020-03-18 DIAGNOSIS — R319 Hematuria, unspecified: Secondary | ICD-10-CM | POA: Diagnosis present

## 2020-03-18 DIAGNOSIS — E861 Hypovolemia: Secondary | ICD-10-CM | POA: Diagnosis present

## 2020-03-18 DIAGNOSIS — D693 Immune thrombocytopenic purpura: Secondary | ICD-10-CM | POA: Diagnosis present

## 2020-03-18 DIAGNOSIS — K529 Noninfective gastroenteritis and colitis, unspecified: Secondary | ICD-10-CM | POA: Diagnosis present

## 2020-03-18 DIAGNOSIS — E872 Acidosis: Secondary | ICD-10-CM | POA: Diagnosis present

## 2020-03-18 DIAGNOSIS — R0602 Shortness of breath: Secondary | ICD-10-CM

## 2020-03-18 DIAGNOSIS — U071 COVID-19: Secondary | ICD-10-CM | POA: Diagnosis present

## 2020-03-18 DIAGNOSIS — I493 Ventricular premature depolarization: Secondary | ICD-10-CM | POA: Diagnosis not present

## 2020-03-18 DIAGNOSIS — N179 Acute kidney failure, unspecified: Secondary | ICD-10-CM

## 2020-03-18 DIAGNOSIS — I471 Supraventricular tachycardia: Secondary | ICD-10-CM | POA: Diagnosis not present

## 2020-03-18 DIAGNOSIS — I480 Paroxysmal atrial fibrillation: Secondary | ICD-10-CM | POA: Diagnosis present

## 2020-03-18 DIAGNOSIS — I130 Hypertensive heart and chronic kidney disease with heart failure and stage 1 through stage 4 chronic kidney disease, or unspecified chronic kidney disease: Secondary | ICD-10-CM | POA: Diagnosis present

## 2020-03-18 DIAGNOSIS — T451X5A Adverse effect of antineoplastic and immunosuppressive drugs, initial encounter: Secondary | ICD-10-CM | POA: Diagnosis present

## 2020-03-18 DIAGNOSIS — I5043 Acute on chronic combined systolic (congestive) and diastolic (congestive) heart failure: Secondary | ICD-10-CM | POA: Diagnosis present

## 2020-03-18 DIAGNOSIS — Z79899 Other long term (current) drug therapy: Secondary | ICD-10-CM

## 2020-03-18 DIAGNOSIS — N1832 Chronic kidney disease, stage 3b: Secondary | ICD-10-CM | POA: Diagnosis present

## 2020-03-18 DIAGNOSIS — Q613 Polycystic kidney, unspecified: Secondary | ICD-10-CM

## 2020-03-18 DIAGNOSIS — I5041 Acute combined systolic (congestive) and diastolic (congestive) heart failure: Secondary | ICD-10-CM

## 2020-03-18 DIAGNOSIS — D696 Thrombocytopenia, unspecified: Secondary | ICD-10-CM | POA: Diagnosis present

## 2020-03-18 DIAGNOSIS — R0989 Other specified symptoms and signs involving the circulatory and respiratory systems: Secondary | ICD-10-CM

## 2020-03-18 DIAGNOSIS — I4892 Unspecified atrial flutter: Secondary | ICD-10-CM | POA: Diagnosis present

## 2020-03-18 DIAGNOSIS — R059 Cough, unspecified: Secondary | ICD-10-CM

## 2020-03-18 DIAGNOSIS — E860 Dehydration: Secondary | ICD-10-CM | POA: Diagnosis present

## 2020-03-18 DIAGNOSIS — N170 Acute kidney failure with tubular necrosis: Principal | ICD-10-CM | POA: Diagnosis present

## 2020-03-18 DIAGNOSIS — I427 Cardiomyopathy due to drug and external agent: Secondary | ICD-10-CM | POA: Diagnosis present

## 2020-03-18 DIAGNOSIS — D631 Anemia in chronic kidney disease: Secondary | ICD-10-CM | POA: Diagnosis present

## 2020-03-18 DIAGNOSIS — J90 Pleural effusion, not elsewhere classified: Secondary | ICD-10-CM

## 2020-03-18 DIAGNOSIS — D649 Anemia, unspecified: Secondary | ICD-10-CM | POA: Diagnosis present

## 2020-03-18 LAB — BASIC METABOLIC PANEL
Anion gap: 10 (ref 5–15)
BUN: 50 mg/dL — ABNORMAL HIGH (ref 8–23)
CO2: 19 mmol/L — ABNORMAL LOW (ref 22–32)
Calcium: 7.9 mg/dL — ABNORMAL LOW (ref 8.9–10.3)
Chloride: 98 mmol/L (ref 98–111)
Creatinine, Ser: 3.31 mg/dL — ABNORMAL HIGH (ref 0.44–1.00)
GFR, Estimated: 15 mL/min — ABNORMAL LOW (ref >60)
Glucose, Bld: 107 mg/dL — ABNORMAL HIGH (ref 70–99)
Potassium: 4.1 mmol/L (ref 3.5–5.1)
Sodium: 127 mmol/L — ABNORMAL LOW (ref 135–145)

## 2020-03-18 LAB — CBC
HCT: 35.4 % — ABNORMAL LOW (ref 36.0–46.0)
Hemoglobin: 11.8 g/dL — ABNORMAL LOW (ref 12.0–15.0)
MCH: 31.7 pg (ref 26.0–34.0)
MCHC: 33.3 g/dL (ref 30.0–36.0)
MCV: 95.2 fL (ref 80.0–100.0)
Platelets: 8 10*3/uL — CL (ref 150–400)
RBC: 3.72 MIL/uL — ABNORMAL LOW (ref 3.87–5.11)
RDW: 15.7 % — ABNORMAL HIGH (ref 11.5–15.5)
WBC: 7.4 10*3/uL (ref 4.0–10.5)
nRBC: 1.2 % — ABNORMAL HIGH (ref 0.0–0.2)

## 2020-03-18 LAB — URINALYSIS, ROUTINE W REFLEX MICROSCOPIC
Bilirubin Urine: NEGATIVE
Glucose, UA: NEGATIVE mg/dL
Ketones, ur: NEGATIVE mg/dL
Leukocytes,Ua: NEGATIVE
Nitrite: NEGATIVE
Protein, ur: >=300 mg/dL — AB
Specific Gravity, Urine: 1.016 (ref 1.005–1.030)
pH: 5 (ref 5.0–8.0)

## 2020-03-18 MED ORDER — MORPHINE SULFATE (PF) 2 MG/ML IV SOLN
2.0000 mg | Freq: Once | INTRAVENOUS | Status: AC
  Administered 2020-03-18: 2 mg via INTRAVENOUS
  Filled 2020-03-18: qty 1

## 2020-03-18 MED ORDER — METHYLPREDNISOLONE SODIUM SUCC 125 MG IJ SOLR
125.0000 mg | Freq: Once | INTRAMUSCULAR | Status: AC
  Administered 2020-03-18: 125 mg via INTRAVENOUS
  Filled 2020-03-18: qty 2

## 2020-03-18 MED ORDER — SODIUM CHLORIDE 0.9% IV SOLUTION: Freq: Once | INTRAVENOUS | Status: AC

## 2020-03-18 MED ORDER — ONDANSETRON HCL 4 MG/2ML IJ SOLN
4.0000 mg | Freq: Once | INTRAMUSCULAR | Status: AC
  Administered 2020-03-18: 4 mg via INTRAVENOUS
  Filled 2020-03-18 (×2): qty 2

## 2020-03-18 NOTE — ED Notes (Signed)
Patient transported to CT 

## 2020-03-18 NOTE — ED Notes (Signed)
Urine collection attempted; specimen was contaminated and discarded. Waiting for a more accurate collection. RN notified.

## 2020-03-18 NOTE — ED Provider Notes (Signed)
Medical screening examination/treatment/procedure(s) were conducted as a shared visit with non-physician practitioner(s) and myself.  I personally evaluated the patient during the encounter.    70 year old who presents with increased weakness.  Has a history multiple myeloma.  Has evidence of acute kidney injury as well as thrombocytopenia.  Will admit to the hospital   Debbie Leigh, MD 03/18/20 2030

## 2020-03-18 NOTE — H&P (Addendum)
Debbie Chan KGM:[id number] DOB: 1950-04-28 DOA: 03/18/2020    PCP: [name], MD   Outpatient Specialists:      Oncology   Dr. Julien Nordmann   Patient arrived to ER on 03/18/20 at 1728 Referred by Attending Lacretia Leigh, MD   Patient coming from: home Lives   With family    Chief Complaint:   Chief Complaint  Patient presents with  . Weakness    HPI: Debbie Chan is a 70 y.o. female with medical history significant of multiple myeloma Atrial flutter hepatomegaly anemia, right pleural effusion  Presented with diarrhea generalized weakness and decreased appetite  Reports since she has received her infusion (MYELOMA SALVAGE Cyclophosphamide / Carfilzomib / Dexamethasone (CCd) q28d) she has been having lower abdominal pain Also noted to suprapubic burning and discomfort with urination Patient have had similar infusions in the past but never had developed some more side effects. Otherwise no chest pain or shortness of breath no fevers no chills Infectious risk factors:  Reports Diarrhea/abdominal pain,    Has  been vaccinated against COVID    Initial COVID TEST   in house  PCR testing  Pending  Lab Results  Component Value Date   [place] NEGATIVE 02/22/2020    Regarding pertinent Chronic problems:      HTN on metoprolol    last echo 2019 preserved EF and no evidence of diastolic dysfunction On Lasix    A.  Flutter-  - CHA2DS2 vas score 2      Not on anticoagulation secondary to Risk of Falls, anemia        -  Rate control:  Currently controlled with Metoprolol,    CKD stage IIIb- baseline Cr 1.2 Estimated Creatinine Clearance: 18.3 mL/min (A) (by C-G formula based on SCr of 3.31 mg/dL (H)).  Lab Results  Component Value Date   CREATININE 3.31 (H) 03/18/2020   CREATININE 1.24 (H) 03/14/2020   CREATININE 1.26 (H) 03/08/2020    Chronic anemia - baseline hg Hemoglobin & Hematocrit  Recent Labs    03/08/20 1349 03/14/20 0858 03/18/20 1747  HGB 11.1*  11.2* 11.8*    While in ER: Noted a platelet count of 8 Noted to have AKI  Sodium of 127  CT showed colitis of the distal descending sigmoid colon  ER Provider Called: Hematology oncology   Dr. Lindi Adie They Recommend admit to medicine   rec 144m of Solumedrol and transfuse plt  Followed by decadron 4 mg daily for 5 days And hemolysis labs Will see in AM   Hospitalist was called for admission for dehydration and AKI   The following Work up has been ordered so far:  Orders Placed This Encounter  Procedures  . Urine culture  . Stool culture (children & immunocomp patients)  . CT ABDOMEN PELVIS WO CONTRAST  . Basic metabolic panel  . CBC  . Urinalysis, Routine w reflex microscopic  . Document Height and Actual Weight  . Informed Consent Details: Physician/Practitioner Attestation; Transcribe to consent form and obtain patient signature  . Consult to oncology  ALL PATIENTS BEING ADMITTED/HAVING PROCEDURES NEED COVID-19 SCREENING  . Consult to hospitalist  ALL PATIENTS BEING ADMITTED/HAVING PROCEDURES NEED COVID-19 SCREENING  . ED EKG  . Type and screen [place] . Prepare Pheresed Platelets  . Insert peripheral IV    Following Medications were ordered in ER: Medications  morphine 2 MG/ML injection 2 mg (2 mg Intravenous Given 03/18/20 2125)  ondansetron (ZOFRAN) injection 4 mg (4  mg Intravenous Given 03/18/20 2124)  0.9 %  sodium chloride infusion (Manually program via Guardrails IV Fluids) ( Intravenous New Bag/Given 03/18/20 2126)  methylPREDNISolone sodium succinate (SOLU-MEDROL) 125 mg/2 mL injection 125 mg (125 mg Intravenous Given 03/18/20 2124)        Consult Orders  (From admission, onward)         Start     Ordered   03/18/20 2214  Consult to hospitalist  ALL PATIENTS BEING ADMITTED/HAVING PROCEDURES NEED COVID-19 SCREENING  Once       Comments: ALL PATIENTS BEING ADMITTED/HAVING PROCEDURES NEED COVID-19 SCREENING  Provider:  (Not yet  assigned)  Question Answer Comment  Place call to: [facility]   Reason for Consult Admit      03/18/20 2213           Significant initial  Findings: Abnormal Labs Reviewed  BASIC METABOLIC PANEL - Abnormal; Notable for the following components:      Result Value   Sodium 127 (*)    CO2 19 (*)    Glucose, Bld 107 (*)    BUN 50 (*)    Creatinine, Ser 3.31 (*)    Calcium 7.9 (*)    GFR, Estimated 15 (*)    All other components within normal limits  CBC - Abnormal; Notable for the following components:   RBC 3.72 (*)    Hemoglobin 11.8 (*)    HCT 35.4 (*)    RDW 15.7 (*)    Platelets 8 (*)    nRBC 1.2 (*)    All other components within normal limits   Otherwise labs showing:    Recent Labs  Lab 03/14/20 0858 03/18/20 1747  NA 139 127*  K 4.5 4.1  CO2 23 19*  GLUCOSE 117* 107*  BUN 16 50*  CREATININE 1.24* 3.31*  CALCIUM 8.4* 7.9*    Cr   Up from baseline see below Lab Results  Component Value Date   CREATININE 3.31 (H) 03/18/2020   CREATININE 1.24 (H) 03/14/2020   CREATININE 1.26 (H) 03/08/2020    Recent Labs  Lab 03/14/20 0858  AST 17  ALT 11  ALKPHOS 63  BILITOT 0.7  PROT 5.6*  ALBUMIN 3.5   Lab Results  Component Value Date   CALCIUM 7.9 (L) 03/18/2020      WBC      Component Value Date/Time   WBC 7.4 03/18/2020 1747   LYMPHSABS 0.3 (L) 03/14/2020 0858   MONOABS 0.2 03/14/2020 0858   EOSABS 0.1 03/14/2020 0858   BASOSABS 0.0 03/14/2020 0858    Plt: Lab Results  Component Value Date   PLT 8 (LL) 03/18/2020     Procalcitonin   Ordered   COVID-19 Labs  No results for input(s): DDIMER, FERRITIN, LDH, CRP in the last 72 hours.  Lab Results  Component Value Date   SARSCOV2NAA NEGATIVE 02/22/2020     HG/HCT   Stable,     Component Value Date/Time   HGB 11.8 (L) 03/18/2020 1747   HGB 11.2 (L) 03/14/2020 0858   HCT 35.4 (L) 03/18/2020 1747   MCV 95.2 03/18/2020 1747    CK ordered   ECG: Ordered Personally reviewed  by me showing: HR : 76 Rhythm:  NSR,    nonspecific changes,   QTC 506    UA  no evidence of UTI  But showing hemauria   Urine analysis:    Component Value Date/Time   COLORURINE YELLOW 03/18/2020 1747   APPEARANCEUR HAZY (A) 03/18/2020 1747  LABSPEC 1.016 03/18/2020 1747   PHURINE 5.0 03/18/2020 1747   GLUCOSEU NEGATIVE 03/18/2020 1747   HGBUR LARGE (A) 03/18/2020 1747   BILIRUBINUR NEGATIVE 03/18/2020 1747   KETONESUR NEGATIVE 03/18/2020 1747   PROTEINUR >=300 (A) 03/18/2020 1747   NITRITE NEGATIVE 03/18/2020 1747   LEUKOCYTESUR NEGATIVE 03/18/2020 1747    Ordered    CTabd/pelvis -  Cardiomegaly and right atrium dialation noted on CT Polycystic morphology of the kidneys noted but no hydro and no stone   ED Triage Vitals  Enc Vitals Group     BP 03/18/20 1742 (!) 168/103     Pulse Rate 03/18/20 1742 71     Resp 03/18/20 1742 18     Temp 03/18/20 1742 97.6 F (36.4 C)     Temp Source 03/18/20 1742 Oral     SpO2 03/18/20 1742 98 %     Weight --      Height --      Head Circumference --      Peak Flow --      Pain Score 03/18/20 1743 10     Pain Loc --      Pain Edu? --      Excl. in [place]? --   TMAX(24)@       Latest  Blood pressure (!) 169/99, pulse 75, temperature 97.6 F (36.4 C), temperature source Oral, resp. rate (!) 25, SpO2 100 %.    Review of Systems:    Pertinent positives include:    fatigue, dysuria,   abdominal pain, diarrhea, Constitutional:  No weight loss, night sweats, Fevers, chills,weight loss  HEENT:  No headaches, Difficulty swallowing,Tooth/dental problems,Sore throat,  No sneezing, itching, ear ache, nasal congestion, post nasal drip,  Cardio-vascular:  No chest pain, Orthopnea, PND, anasarca, dizziness, palpitations.no Bilateral lower extremity swelling  GI:  No heartburn, indigestion,nausea, vomiting,  change in bowel habits, loss of appetite, melena, blood in stool, hematemesis Resp:  no shortness of breath at rest. No dyspnea  on exertion, No excess mucus, no productive cough, No non-productive cough, No coughing up of blood.No change in color of mucus.No wheezing. Skin:  no rash or lesions. No jaundice GU:  no change in color of urine, no urgency or frequency. No straining to urinate.  No flank pain.  Musculoskeletal:  No joint pain or no joint swelling. No decreased range of motion. No back pain.  Psych:  No change in mood or affect. No depression or anxiety. No memory loss.  Neuro: no localizing neurological complaints, no tingling, no weakness, no double vision, no gait abnormality, no slurred speech, no confusion  All systems reviewed and apart from [place] all are negative  Past Medical History:   Past Medical History:  Diagnosis Date  . Cancer [facility])      Past Surgical History:  Procedure Laterality Date  . IR IMAGING GUIDED PORT INSERTION  05/10/2019    Social History:  Ambulatory  independently or  walker        reports that she has never smoked. She has never used smokeless tobacco. She reports that she does not drink alcohol and does not use drugs.    Family History:   Family History  Problem Relation Age of Onset  . Hypertension Other     Allergies: No Known Allergies   Prior to Admission medications   Medication Sig Start Date End Date Taking? Authorizing Provider  acyclovir (ZOVIRAX) 200 MG capsule Take 1 capsule (200 mg total) by mouth 2 (two) times daily.  12/01/19   Heilingoetter, Cassandra L, PA-C  blood glucose meter kit and supplies Dispense based on patient and insurance preference. Use up to four times daily as directed. (FOR ICD-10 E10.9, E11.9). 09/28/17   Arrien, Jimmy Picket, MD  dexamethasone (DECADRON) 4 MG tablet Take 10 tablets (40 mg total) by mouth every 7 (seven) days. 01/05/20   Curt Bears, MD  diphenhydrAMINE (BENADRYL ALLERGY) 25 MG tablet Take 1 tablet (25 mg total) by mouth every 6 (six) hours as needed for itching. 10/23/17   Tanner, Lyndon Code., PA-C   furosemide (LASIX) 40 MG tablet Take 40 mg by mouth daily as needed. 05/24/19   [provider]  lidocaine-prilocaine (EMLA) cream Apply 1 application topically as needed. 01/05/20   Curt Bears, MD  metoprolol tartrate (LOPRESSOR) 25 MG tablet TAKE 1/2 TABLET BY MOUTH TWICE DAILY 10/28/19   Curt Bears, MD  omeprazole (PRILOSEC) 20 MG capsule Take 1 capsule (20 mg total) by mouth daily. 12/01/19   Heilingoetter, Cassandra L, PA-C  oxyCODONE-acetaminophen (PERCOCET) 5-325 MG tablet Take 2 tablets by mouth every 6 (six) hours as needed. 02/17/19   Milton Ferguson, MD  prochlorperazine (COMPAZINE) 10 MG tablet Take 1 tablet (10 mg total) by mouth every 6 (six) hours as needed for nausea or vomiting. 01/05/20   Curt Bears, MD  RELION GLUCOSE TEST STRIPS test strip USE 1 TO CHECK GLUCOSE ONCE DAILY 03/06/18   [provider]  ReliOn Ultra Thin Lancets 30G MISC USE 1 TO CHECK GLUCOSE ONCE DAILY FOR GLUCOSE TESTING 03/06/18   [provider]  traMADol (ULTRAM) 50 MG tablet Take 50 mg by mouth every 8 (eight) hours as needed.  03/03/19   [provider]   Physical Exam: Vitals with BMI 03/18/2020 03/18/2020 03/18/2020  Height - - -  Weight - - -  BMI - - -  Systolic 194 174 081  Diastolic 99 96 448  Pulse 75 73 -     1. General:  in No  Acute distress    Chronically ill -appearing 2. Psychological: Alert and  Oriented to self not situation  3. Head/ENT:    Dry Mucous Membranes                          Head Non traumatic, neck supple                            Poor Dentition 4. SKIN:   decreased Skin turgor,  Skin clean Dry and intact no rash 5. Heart: Regular rate and rhythm no Murmur, no Rub or gallop 6. Lungs:  no wheezes or crackles   7. Abdomen: Soft,  non-tender,  distended  bowel sounds present 8. Lower extremities: no clubbing, cyanosis, no  edema 9. Neurologically Grossly intact, moving all 4 extremities equally  10. MSK: Normal range of  motion   All other LABS:     Recent Labs  Lab 03/14/20 0858 03/18/20 1747  WBC 2.4* 7.4  NEUTROABS 1.8  --   HGB 11.2* 11.8*  HCT 34.7* 35.4*  MCV 97.5 95.2  PLT 67* 8*     Recent Labs  Lab 03/14/20 0858 03/18/20 1747  NA 139 127*  K 4.5 4.1  CL 108 98  CO2 23 19*  GLUCOSE 117* 107*  BUN 16 50*  CREATININE 1.24* 3.31*  CALCIUM 8.4* 7.9*     Recent Labs  Lab 03/14/20 0858  AST 17  ALT 11  ALKPHOS 63  BILITOT 0.7  PROT 5.6*  ALBUMIN 3.5      Cultures:    Component Value Date/Time   SDES  10/22/2017 1011    BLOOD Performed at [facility], [place] [address] [place], [place] [address]    SPECREQUEST  10/22/2017 1011    NONE Performed at [facility], [place] [address], [place], [place] [address]    CULT  10/22/2017 1011    NO GROWTH 5 DAYS Performed at [facility] [address], [place], [place] [address]    REPTSTATUS 10/27/2017 FINAL 10/22/2017 1011     Radiological Exams on Admission: CT ABDOMEN PELVIS WO CONTRAST  Result Date: 03/18/2020 CLINICAL DATA:  70 year old female with left lower quadrant abdominal pain. Concern for diverticulitis. EXAM: CT ABDOMEN AND PELVIS WITHOUT CONTRAST TECHNIQUE: Multidetector CT imaging of the abdomen and pelvis was performed following the standard protocol without IV contrast. COMPARISON:  None. FINDINGS: Evaluation of this exam is limited in the absence of intravenous contrast. Lower chest: Partially visualized small bilateral pleural effusions with bibasilar subsegmental atelectasis. There is cardiomegaly with dilatation of the right atrium. No intra-abdominal free air. Small ascites. Hepatobiliary: Multiple hepatic cysts and several additional subcentimeter hypodensities which are too small to characterize. No intrahepatic biliary dilatation. The gallbladder is unremarkable. Pancreas: Unremarkable. No pancreatic ductal dilatation or surrounding inflammatory  changes. Spleen: Normal in size without focal abnormality. Adrenals/Urinary Tract: Polycystic morphology of the kidneys with replacement of the majority of the renal parenchyma by cysts. Evaluation of the renal cyst is limited on this noncontrast CT. There is no hydronephrosis or nephrolithiasis on either side. The urinary bladder is grossly unremarkable. Stomach/Bowel: There is sigmoid diverticulosis. There is diffuse inflammatory changes and thickening of the distal descending and sigmoid colon consistent with colitis. Correlation with clinical exam and stool cultures recommended. No bowel obstruction. The appendix is normal. Vascular/Lymphatic: Moderate aortoiliac atherosclerotic disease. The IVC is unremarkable. No portal venous gas. There is no adenopathy. Reproductive: The uterus and ovaries are grossly unremarkable. Other: Mild subcutaneous edema. No fluid collection. Musculoskeletal: Osteopenia. No acute osseous pathology. IMPRESSION: 1. Colitis of the distal descending and sigmoid colon. Correlation with clinical exam and stool cultures recommended. No bowel obstruction. Normal appendix. 2. Sigmoid diverticulosis. 3. Polycystic morphology of the kidneys. 4. Small bilateral pleural effusions with bibasilar subsegmental atelectasis. 5. Aortic Atherosclerosis (ICD10-I70.0). Electronically Signed   By: Anner Crete M.D.   On: 03/18/2020 21:33    Chart has been reviewed    Assessment/Plan   70 y.o. female with medical history significant of multiple myeloma Atrial flutter hepatomegaly anemia, right pleural effusion  Admitted for Colitis, new onset thrombocytopenia, aki and dehydration  Present on Admission: . Thrombocytopenia ([place]) -unclear etiology Case discussed with hematology oncology who recommends initiation of steroids Decadron daily after initial dose of Solu-Medrol. And transfuse platelets Hemolysis labs. Of note have had some hard time getting labs from the patient secondary to  difficult sticks. Attempted to add on labs to with the way down in the lab DAT preliminary was positive being repeated Appreciate oncology input  . Colitis -unclear etiology obtain gastric panel no recent antibiotic use. Check C. difficile Check lactic acid Initiate Zosyn for tonight  . Hematuria -imaging showing multiple cysts on the kidney as well as liver. Would benefit from work-up for polycystic disease And/or urology work-up.  At this point no evidence of hydronephrosis or nephro lithiasis   .  Multiple myeloma without remission [facility]) appreciate oncology help with management of this patient  . Atrial flutter ([place]) -currently stable patient not on anticoagulation  . Anemia obtain anemia panel continue to monitor patient denies any blood in  . AKI (acute kidney injury) ([place]) -most likely in the setting of dehydration will rehydrate and follow Obtain urine electrolytes no evidence of obstruction on CT abdomen Given abnormal imaging of the kidneys would benefit from nephrology consult  . Hyponatremia -most likely secondary to dehydration will obtain urine electrolytes and rehydrate continue to follow sodium serially   . Dehydration -rehydrate and follow renal status  Accelerated hypertension -has not been able to tolerate home medications very well. Resume home medications when able to take Labetalol as needed  Cardiomegaly -new, obtain echogram  Other plan as per orders.  DVT prophylaxis:  SCD       Code Status:    Code Status: Prior FULL CODE  as per patient   I had personally discussed CODE STATUS with patient     Family Communication:   Family not at  Bedside    Disposition Plan:    To home once workup is complete and patient is stable   Following barriers for discharge:                            Electrolytes corrected                             Thrombocytopenia stable/corrected                                                          Will need to be able to  tolerate PO                            Will likely need home health, home O2, set up                           Will need consultants to evaluate patient prior to discharge                     Consults called: oncology is aware will see in Am , please consult nephrology in a.m. regarding further AKI work-up/possible polycystic disease  Admission status:  ED Disposition    ED Disposition Condition [place]: [facility] [[id number]]  Level of Care: Telemetry [5]  Admit to tele based on following criteria: Other see comments  Comments: aki  May admit patient to [facility] or [facility] if equivalent level of care is available:: No  Covid Evaluation: Symptomatic Person Under Investigation (PUI)  Diagnosis: Thrombocytopenia ([place]) [[id number]]  Admitting Physician: Toy Baker [[id number]]  Attending Physician: Toy Baker [[id number]]  Estimated length of stay: past midnight tomorrow  Certification:: I certify this patient will need inpatient services for at least 2 midnights       inpatient     I Expect 2 midnight stay secondary to severity of patient's current illness need for inpatient interventions justified by the following:  hemodynamic instability despite optimal treatment (tachycardia  Hypertension )  Severe lab/radiological/exam abnormalities including:   Thrombocytopenia and extensive comorbidities including:   malignancy,   That are currently affecting medical management.   I expect  patient to be hospitalized for 2 midnights requiring inpatient medical care.  Patient is at high risk for adverse outcome (such as loss of life or disability) if not treated.  Indication for inpatient stay as follows:   Hemodynamic instability despite maximal medical therapy,    Need for IV antibiotics, IV fluids, IV steroids   Level of care    tele  For   24H         Lab Results  Component Value Date   [place] NEGATIVE 02/22/2020      Precautions: admitted as PUI   Airborne and Contact precautions  If Covid PCR is negative  - please DC precautions    PPE: Used by the provider:   P100  eye Goggles,  Gloves      Debbie Chan 03/19/2020, 2:32 AM    [facility]     after 2 AM please page floor coverage PA If 7AM-7PM, please contact the day team taking care of the patient using [email or web address]   Patient was evaluated in the context of the global COVID-19 pandemic, which necessitated consideration that the patient might be at risk for infection with the SARS-CoV-2 virus that causes COVID-19. Institutional protocols and algorithms that pertain to the evaluation of patients at risk for COVID-19 are in a state of rapid change based on information released by regulatory bodies including the CDC and federal and state organizations. These policies and algorithms were followed during the patient's care.

## 2020-03-18 NOTE — ED Notes (Signed)
Second attempt of obtaining specimen, pt stated that they did not need use the restroom.

## 2020-03-18 NOTE — ED Triage Notes (Signed)
Patient reports to the ER for Weakness. Patient has a hx of multiple myeloma. Patient is reporting diarrhea, weakness, and decreased appetite.

## 2020-03-18 NOTE — ED Notes (Signed)
Initial contact with pt, pt is laying in bed with sin at bedside for support. Pt is on monitor x4 no signs of distress. Pt is c/o lower abdominal pain and would like meds. Provider made aware. Will continue to monitor.

## 2020-03-18 NOTE — ED Notes (Addendum)
Pt has been medicate  Per MD orders. Pt blood has been sentand has eaten half of [place] sandwich. Son remains at bedside for support. On monitor x4 will continue to monitor.

## 2020-03-18 NOTE — ED Provider Notes (Signed)
Patient's CT abdomen pelvis was personally reviewed which demonstrates colitis of the distal descending sigmoid colon, consistent with [place] DEPT Provider Note   CSN: [id number] Arrival date & time: 03/18/20  1728     History Chief Complaint  Patient presents with  . Weakness    Debbie Chan is a 70 y.o. female with past medical history significant for multiple myeloma with most recent infusion 03/15/2020 followed by Dr. Julien Nordmann who presents to the ED via EMS for weakness.  On my examination, patient is accompanied by her son who is at bedside.  She received infusions on 03/11/2020 and 03/12/2020.  Since then, she has been experiencing lower left-sided abdominal pain, loose nonbloody stools, and generalized weakness.  Her son states that her low energy has been a deviation from her baseline.  They report that they have been receiving infusions since 2019 and have never experienced these symptoms as an adverse effect in the past.  She denies any personal history of colonoscopies.  Unclear as to whether or not she has a history of diverticular disease.  She also states that she has been having intermittent suprapubic burning discomfort with urination.    She denies any chest pain, shortness of breath, fevers or chills, constipation, nausea or vomiting, or other symptoms.  HPI     Past Medical History:  Diagnosis Date  . Cancer [facility])     Patient Active Problem List   Diagnosis Date Noted  . Cough 01/25/2020  . Hip pain 12/01/2019  . Port-A-Cath in place 06/09/2019  . Encounter for antineoplastic chemotherapy 10/02/2017  . Goals of care, counseling/discussion 10/02/2017  . Multiple myeloma without remission ([place])   . Hepatomegaly   . Atrial flutter ([place])   . Chest pain 09/19/2017  . Tachycardia 09/19/2017  . Lytic bone lesions on xray 09/19/2017  . Anemia 09/19/2017  . AKI (acute kidney injury) ([place]) 09/19/2017  . Pleural effusion on right  09/19/2017    Past Surgical History:  Procedure Laterality Date  . IR IMAGING GUIDED PORT INSERTION  05/10/2019     OB History   No obstetric history on file.     No family history on file.  Social History   Tobacco Use  . Smoking status: Never Smoker  . Smokeless tobacco: Never Used  Vaping Use  . Vaping Use: Never used  Substance Use Topics  . Alcohol use: Never  . Drug use: Never    Home Medications Prior to Admission medications   Medication Sig Start Date End Date Taking? Authorizing Provider  acyclovir (ZOVIRAX) 200 MG capsule Take 1 capsule (200 mg total) by mouth 2 (two) times daily. 12/01/19   Heilingoetter, Cassandra L, PA-C  blood glucose meter kit and supplies Dispense based on patient and insurance preference. Use up to four times daily as directed. (FOR ICD-10 E10.9, E11.9). 09/28/17   Arrien, Jimmy Picket, MD  dexamethasone (DECADRON) 4 MG tablet Take 10 tablets (40 mg total) by mouth every 7 (seven) days. 01/05/20   Curt Bears, MD  diphenhydrAMINE (BENADRYL ALLERGY) 25 MG tablet Take 1 tablet (25 mg total) by mouth every 6 (six) hours as needed for itching. 10/23/17   Tanner, Lyndon Code., PA-C  furosemide (LASIX) 40 MG tablet Take 40 mg by mouth daily as needed. 05/24/19   [provider]  lidocaine-prilocaine (EMLA) cream Apply 1 application topically as needed. 01/05/20   Curt Bears, MD  metoprolol tartrate (LOPRESSOR) 25 MG tablet TAKE 1/2 TABLET BY MOUTH TWICE DAILY  10/28/19   Curt Bears, MD  omeprazole (PRILOSEC) 20 MG capsule Take 1 capsule (20 mg total) by mouth daily. 12/01/19   Heilingoetter, Cassandra L, PA-C  oxyCODONE-acetaminophen (PERCOCET) 5-325 MG tablet Take 2 tablets by mouth every 6 (six) hours as needed. 02/17/19   Milton Ferguson, MD  prochlorperazine (COMPAZINE) 10 MG tablet Take 1 tablet (10 mg total) by mouth every 6 (six) hours as needed for nausea or vomiting. 01/05/20   Curt Bears, MD  RELION GLUCOSE TEST STRIPS  test strip USE 1 TO CHECK GLUCOSE ONCE DAILY 03/06/18   [provider]  ReliOn Ultra Thin Lancets 30G MISC USE 1 TO CHECK GLUCOSE ONCE DAILY FOR GLUCOSE TESTING 03/06/18   [provider]  traMADol (ULTRAM) 50 MG tablet Take 50 mg by mouth every 8 (eight) hours as needed.  03/03/19   [provider]    Allergies    Patient has no known allergies.  Review of Systems   Review of Systems  Physical Exam Updated Vital Signs BP (!) 169/99 (BP Location: Right Arm)   Pulse 75   Temp 97.6 F (36.4 C) (Oral)   Resp (!) 25   SpO2 100%   Physical Exam  ED Results / Procedures / Treatments   Labs (all labs ordered are listed, but only abnormal results are displayed) Labs Reviewed  BASIC METABOLIC PANEL - Abnormal; Notable for the following components:      Result Value   Sodium 127 (*)    CO2 19 (*)    Glucose, Bld 107 (*)    BUN 50 (*)    Creatinine, Ser 3.31 (*)    Calcium 7.9 (*)    GFR, Estimated 15 (*)    All other components within normal limits  CBC - Abnormal; Notable for the following components:   RBC 3.72 (*)    Hemoglobin 11.8 (*)    HCT 35.4 (*)    RDW 15.7 (*)    Platelets 8 (*)    nRBC 1.2 (*)    All other components within normal limits  URINE CULTURE  STOOL CULTURE  URINALYSIS, ROUTINE W REFLEX MICROSCOPIC  CBC WITH DIFFERENTIAL/PLATELET  SAVE SMEAR (SSMR)  HAPTOGLOBIN  LACTATE DEHYDROGENASE  TYPE AND SCREEN  PREPARE PLATELET PHERESIS  DIRECT ANTIGLOBULIN TEST (NOT AT [facility])    EKG None  Radiology CT ABDOMEN PELVIS WO CONTRAST  Result Date: 03/18/2020 CLINICAL DATA:  70 year old female with left lower quadrant abdominal pain. Concern for diverticulitis. EXAM: CT ABDOMEN AND PELVIS WITHOUT CONTRAST TECHNIQUE: Multidetector CT imaging of the abdomen and pelvis was performed following the standard protocol without IV contrast. COMPARISON:  None. FINDINGS: Evaluation of this exam is limited in the absence of intravenous contrast.  Lower chest: Partially visualized small bilateral pleural effusions with bibasilar subsegmental atelectasis. There is cardiomegaly with dilatation of the right atrium. No intra-abdominal free air. Small ascites. Hepatobiliary: Multiple hepatic cysts and several additional subcentimeter hypodensities which are too small to characterize. No intrahepatic biliary dilatation. The gallbladder is unremarkable. Pancreas: Unremarkable. No pancreatic ductal dilatation or surrounding inflammatory changes. Spleen: Normal in size without focal abnormality. Adrenals/Urinary Tract: Polycystic morphology of the kidneys with replacement of the majority of the renal parenchyma by cysts. Evaluation of the renal cyst is limited on this noncontrast CT. There is no hydronephrosis or nephrolithiasis on either side. The urinary bladder is grossly unremarkable. Stomach/Bowel: There is sigmoid diverticulosis. There is diffuse inflammatory changes and thickening of the distal descending and sigmoid colon consistent with colitis.  Correlation with clinical exam and stool cultures recommended. No bowel obstruction. The appendix is normal. Vascular/Lymphatic: Moderate aortoiliac atherosclerotic disease. The IVC is unremarkable. No portal venous gas. There is no adenopathy. Reproductive: The uterus and ovaries are grossly unremarkable. Other: Mild subcutaneous edema. No fluid collection. Musculoskeletal: Osteopenia. No acute osseous pathology. IMPRESSION: 1. Colitis of the distal descending and sigmoid colon. Correlation with clinical exam and stool cultures recommended. No bowel obstruction. Normal appendix. 2. Sigmoid diverticulosis. 3. Polycystic morphology of the kidneys. 4. Small bilateral pleural effusions with bibasilar subsegmental atelectasis. 5. Aortic Atherosclerosis (ICD10-I70.0). Electronically Signed   By: Anner Crete M.D.   On: 03/18/2020 21:33    Procedures .Critical Care Performed by: Corena Herter, PA-C Authorized  by: Corena Herter, PA-C   Critical care provider statement:    Critical care time (minutes):  45   Critical care was necessary to treat or prevent imminent or life-threatening deterioration of the following conditions:  Circulatory failure   Critical care was time spent personally by me on the following activities:  Discussions with consultants, evaluation of patient's response to treatment, examination of patient, ordering and performing treatments and interventions, ordering and review of laboratory studies, ordering and review of radiographic studies, pulse oximetry, re-evaluation of patient's condition, obtaining history from patient or surrogate and review of old charts Comments:     Critical thrombocytopenia requiring transfusion. High risk patient. Consulted oncology.    (including critical care time)  Medications Ordered in ED Medications  morphine 2 MG/ML injection 2 mg (2 mg Intravenous Given 03/18/20 2125)  ondansetron (ZOFRAN) injection 4 mg (4 mg Intravenous Given 03/18/20 2124)  0.9 %  sodium chloride infusion (Manually program via Guardrails IV Fluids) ( Intravenous New Bag/Given 03/18/20 2126)  methylPREDNISolone sodium succinate (SOLU-MEDROL) 125 mg/2 mL injection 125 mg (125 mg Intravenous Given 03/18/20 2124)    ED Course  I have reviewed the triage vital signs and the nursing notes.  Pertinent labs & imaging results that were available during my care of the patient were reviewed by me and considered in my medical decision making (see chart for details).  Clinical Course as of 03/18/20 2306  Nancy Fetter Mar 18, 2020  2101 I spoke with Dr. Lindi Adie who recommended hospitalist admission.  Also recommended Solu-Medrol 125 mg and transfused with platelets given patient's thrombocytopenia. [GG]    Clinical Course User Index [GG] Corena Herter, PA-C   MDM Rules/Calculators/A&P                          Kaleiah Kutzer was evaluated in Emergency Department on 03/18/2020 for the  symptoms described in the history of present illness. She was evaluated in the context of the global COVID-19 pandemic, which necessitated consideration that the patient might be at risk for infection with the SARS-CoV-2 virus that causes COVID-19. Institutional protocols and algorithms that pertain to the evaluation of patients at risk for COVID-19 are in a state of rapid change based on information released by regulatory bodies including the CDC and federal and state organizations. These policies and algorithms were followed during the patient's care in the ED.  I personally reviewed patient's medical chart and all notes from triage and staff during today's encounter. I have also ordered and reviewed all labs and imaging that I felt to be medically necessary in the evaluation of this patient's complaints and with consideration of their with their physical exam. If needed, translation services were available and utilized.  Will consult Dr. Lindi Adie given that she is closely followed by her oncologist, Dr. Julien Nordmann.  She denies any history of similar.  CBC notable for thrombocytopenia with platelets of 8, significantly reduced when compared to recent labs.  She also has hyponatremia to 127 and significant AKI with creatinine elevated to 3.31 when compared to 1.24 in labs obtained 4 days ago.  Treating patient's pain and nausea with morphine and Zofran IV.  I spoke with Dr. Lindi Adie who recommended hospitalist admission.  Also recommended Solu-Medrol 125 mg and transfused with platelets given patient's thrombocytopenia.  I personally reviewed CT abdomen pelvis which demonstrates colitis of the distal descending and sigmoid colon, consistent with clinical exam.  We will order stool cultures.  She denies any recent antibiotic use.  Per Dr. Lindi Adie, recommending continue Decadron 4 mg daily for 5 days.  Dr. Julien Nordmann will determine if she needs bone marrow.  Also recommending that we obtain hemolysis labs.  I  spoke with Dr. Roel Cluck who will see and admit patient.  Final Clinical Impression(s) / ED Diagnoses Final diagnoses:  AKI (acute kidney injury) ([place])  Thrombocytopenia [facility])    Rx / DC Orders ED Discharge Orders    None       Reita Chard 03/18/20 2306    Lacretia Leigh, MD 03/23/20 [phone]

## 2020-03-18 NOTE — ED Notes (Signed)
Platelet Count: 8   Dr. Zenia Resides Notified

## 2020-03-18 NOTE — H&P (Incomplete)
Debbie Chan HXT:[id number] DOB: 09/03/1950 DOA: 03/18/2020    PCP: [name], MD   Outpatient Specialists:      Oncology   Dr. Julien Nordmann   Patient arrived to ER on 03/18/20 at 1728 Referred by Attending Lacretia Leigh, MD   Patient coming from: home Lives   With family    Chief Complaint:   Chief Complaint  Patient presents with  . Weakness    HPI: Debbie Chan is a 70 y.o. female with medical history significant of multiple myeloma Atrial flutter hepatomegaly anemia  Presented with diarrhea generalized weakness and decreased appetite  Reports since she has received her infusion (MYELOMA SALVAGE Cyclophosphamide / Carfilzomib / Dexamethasone (CCd) q28d) she has been having lower abdominal pain Also noted to suprapubic burning and discomfort with urination  Infectious risk factors:  Reports*** fever, shortness of breath, dry cough, chest pain, Sore throat, URI symptoms, anosmia/change in taste, N/V/Diarrhea/abdominal pain,  Body aches, severe fatigue Reports known sick contacts, known COVID 19 exposure, inability to completely isolate   ***KNOWN COVID POSITIVE   Has ***NOt been vaccinated against COVID    Initial COVID TEST  NEGATIVE**** POSITIVE,  ***in house  PCR testing  Pending  Lab Results  Component Value Date   [place] NEGATIVE 02/22/2020     Regarding pertinent Chronic problems: ***  ****Hyperlipidemia - *on statins Lipid Panel  No results found for: CHOL, TRIG, HDL, CHOLHDL, VLDL, LDLCALC, LDLDIRECT, LABVLDL  ***HTN on metoprolol  ***chronic CHF diastolic/systolic/ combined - last echo*** Lasix *** CAD  - On Aspirin, statin, betablocker, Plavix                 - *followed by cardiology                - last cardiac cath  The ASCVD Risk score ([place]., et al., 2013) failed to calculate for the following reasons:   Cannot find a previous HDL lab   Cannot find a previous total cholesterol lab    ***DM 2 - No results found for: HGBA1C  ****on insulin, PO meds only, diet controlled  ***Hypothyroidism:  Lab Results  Component Value Date   TSH 2.603 09/19/2017   on synthroid  *** Morbid obesity-   BMI Readings from Last 1 Encounters:  03/08/20 28.43 kg/m     *** Asthma -well *** controlled on home inhalers/ nebs f                        ***last no prior***admission  ***                       No ***history of intubation  *** COPD - not **followed by pulmonology *** not  on baseline oxygen  *L,    *** OSA -on nocturnal oxygen, *CPAP, *noncompliant with CPAP  *** Hx of CVA - *with/out residual deficits on Aspirin 81 mg, 325, Plavix  ***A. Fib -  - CHA2DS2 vas score **** CHA2DS2/VAS Stroke Risk Points  Current as of 19 minutes ago     ? >= 2 Points: High Risk  1 - 1.99 Points: Medium Risk  0 Points: Low Risk    Last Change: N/A      Details    This score determines the patient's risk of having a stroke if the  patient has atrial fibrillation.       Points Metrics  0 Has Congestive Heart Failure:  No  Current as of 19 minutes ago  0 Has Vascular Disease:  No    Current as of 19 minutes ago  1 Has Hypertension:  Yes    Current as of 19 minutes ago  1 Age:  27    Current as of 19 minutes ago  0 Has Diabetes:  No    Current as of 19 minutes ago  0 Had Stroke:  No  Had TIA:  No  Had Thromboembolism:  No    Current as of 19 minutes ago  1 Female:  Yes    Current as of 19 minutes ago           current  on anticoagulation with ****Coumadin  ***Xarelto,* Eliquis,  *** Not on anticoagulation secondary to Risk of Falls, *** recurrent bleeding         -  Rate control:  Currently controlled with ***Toprolol,  *Metoprolol,* Diltiazem, *Coreg          - Rhythm control: *** amiodarone, *flecainide  ***CKD stage III*- baseline Cr **** Estimated Creatinine Clearance: 18.3 mL/min (A) (by C-G formula based on SCr of 3.31 mg/dL (H)).  Lab Results  Component Value Date   CREATININE 3.31 (H) 03/18/2020    CREATININE 1.24 (H) 03/14/2020   CREATININE 1.26 (H) 03/08/2020     **** Liver disease Computed MELD-Na score unavailable. Necessary lab results were not found in the last year. Computed MELD score unavailable. Necessary lab results were not found in the last year.   ***BPH - on Flomax, Proscar    *** Dementia - on Aricept** Nemenda  *** Chronic anemia - baseline hg Hemoglobin & Hematocrit  Recent Labs    03/08/20 1349 03/14/20 0858 03/18/20 1747  HGB 11.1* 11.2* 11.8*    While in ER: Noted a platelet count of eight Noted to have AKI  CT showed colitis of the distal descending sigmoid colon ER Provider Called: Hematology oncology   Dr. Lindi Adie They Recommend admit to medicine   rec 179m of Solumedrol and transfuse plt  Will see in AM*  in ER  Hospitalist was called for admission for dehydration and AKI   The following Work up has been ordered so far:  Orders Placed This Encounter  Procedures  . Urine culture  . Stool culture (children & immunocomp patients)  . CT ABDOMEN PELVIS WO CONTRAST  . Basic metabolic panel  . CBC  . Urinalysis, Routine w reflex microscopic  . Document Height and Actual Weight  . Informed Consent Details: Physician/Practitioner Attestation; Transcribe to consent form and obtain patient signature  . Consult to oncology  ALL PATIENTS BEING ADMITTED/HAVING PROCEDURES NEED COVID-19 SCREENING  . Consult to hospitalist  ALL PATIENTS BEING ADMITTED/HAVING PROCEDURES NEED COVID-19 SCREENING  . ED EKG  . [place] and [place] . Prepare Pheresed Platelets  . Insert peripheral IV    Following Medications were ordered in ER: Medications  morphine 2 MG/ML injection 2 mg (2 mg Intravenous Given 03/18/20 2125)  ondansetron (ZOFRAN) injection 4 mg (4 mg Intravenous Given 03/18/20 2124)  0.9 %  sodium chloride infusion (Manually program via Guardrails IV Fluids) ( Intravenous New Bag/Given 03/18/20 2126)  methylPREDNISolone  sodium succinate (SOLU-MEDROL) 125 mg/2 mL injection 125 mg (125 mg Intravenous Given 03/18/20 2124)        Consult Orders  (From admission, onward)         Start     Ordered   03/18/20 2214  Consult to hospitalist  ALL PATIENTS BEING ADMITTED/HAVING PROCEDURES NEED COVID-19 SCREENING  Once       Comments: ALL PATIENTS BEING ADMITTED/HAVING PROCEDURES NEED COVID-19 SCREENING  Provider:  (Not yet assigned)  Question Answer Comment  Place call to: [facility]   Reason for Consult Admit      03/18/20 2213           Significant initial  Findings: Abnormal Labs Reviewed  BASIC METABOLIC PANEL - Abnormal; Notable for the following components:      Result Value   Sodium 127 (*)    CO2 19 (*)    Glucose, Bld 107 (*)    BUN 50 (*)    Creatinine, Ser 3.31 (*)    Calcium 7.9 (*)    GFR, Estimated 15 (*)    All other components within normal limits  CBC - Abnormal; Notable for the following components:   RBC 3.72 (*)    Hemoglobin 11.8 (*)    HCT 35.4 (*)    RDW 15.7 (*)    Platelets 8 (*)    nRBC 1.2 (*)    All other components within normal limits     Otherwise labs showing:    Recent Labs  Lab 03/14/20 0858 03/18/20 1747  NA 139 127*  K 4.5 4.1  CO2 23 19*  GLUCOSE 117* 107*  BUN 16 50*  CREATININE 1.24* 3.31*  CALCIUM 8.4* 7.9*    Cr  * stable,  Up from baseline see below Lab Results  Component Value Date   CREATININE 3.31 (H) 03/18/2020   CREATININE 1.24 (H) 03/14/2020   CREATININE 1.26 (H) 03/08/2020    Recent Labs  Lab 03/14/20 0858  AST 17  ALT 11  ALKPHOS 63  BILITOT 0.7  PROT 5.6*  ALBUMIN 3.5   Lab Results  Component Value Date   CALCIUM 7.9 (L) 03/18/2020          WBC      Component Value Date/Time   WBC 7.4 03/18/2020 1747   LYMPHSABS 0.3 (L) 03/14/2020 0858   MONOABS 0.2 03/14/2020 0858   EOSABS 0.1 03/14/2020 0858   BASOSABS 0.0 03/14/2020 0858        Plt: Lab Results  Component Value Date   PLT 8 (LL)  03/18/2020     Lactic Acid, Venous    Component Value Date/Time   LATICACIDVEN 1.58 09/19/2017 1357   @ (RESUTFAST[lacticacid:4)@  Procalcitonin *** Ordered   COVID-19 Labs  No results for input(s): DDIMER, FERRITIN, LDH, CRP in the last 72 hours.  Lab Results  Component Value Date   [place] NEGATIVE 02/22/2020      Arterial ***Venous  Blood Gas result:  pH *** pCO2 ***; pO2 ***;     %O2 Sat ***.  ABG    Component Value Date/Time   TCO2 25 09/19/2017 1057      HG/HCT * stable,  Down *Up from baseline see below    Component Value Date/Time   HGB 11.8 (L) 03/18/2020 1747   HGB 11.2 (L) 03/14/2020 0858   HCT 35.4 (L) 03/18/2020 1747   MCV 95.2 03/18/2020 1747    No results for input(s): LIPASE, AMYLASE in the last 168 hours. No results for input(s): AMMONIA in the last 168 hours.     Troponin ***ordered Cardiac Panel (last 3 results) No results for input(s): CKTOTAL, CKMB, TROPONINI, RELINDX in the last 72 hours.     ECG: Ordered Personally reviewed by me showing: HR : *** Rhythm: *NSR, Sinus tachycardia *  A.fib. W RVR, RBBB, LBBB, Paced Ischemic changes*nonspecific changes, no evidence of ischemic changes QTC*   BNP (last 3 results) No results for input(s): BNP in the last 8760 hours.    DM  labs:  HbA1C: No results for input(s): HGBA1C in the last 8760 hours.     CBG (last 3)  No results for input(s): GLUCAP in the last 72 hours.     UA *** no evidence of UTI  ***Pending ***not ordered   Urine analysis:    Component Value Date/Time   COLORURINE YELLOW 10/22/2017 1010   APPEARANCEUR CLEAR 10/22/2017 1010   LABSPEC 1.006 10/22/2017 1010   PHURINE 7.0 10/22/2017 1010   GLUCOSEU NEGATIVE 10/22/2017 1010   HGBUR MODERATE (A) 10/22/2017 1010   BILIRUBINUR NEGATIVE 10/22/2017 1010   KETONESUR NEGATIVE 10/22/2017 1010   PROTEINUR NEGATIVE 10/22/2017 1010   NITRITE NEGATIVE 10/22/2017 1010   LEUKOCYTESUR TRACE (A) 10/22/2017 1010        Ordered  CT HEAD *** NON acute  CXR - ***NON acute  CTabd/pelvis - ***nonacute  CTA chest - ***nonacute, no PE, * no evidence of infiltrate      ED Triage Vitals  Enc Vitals Group     BP 03/18/20 1742 (!) 168/103     Pulse Rate 03/18/20 1742 71     Resp 03/18/20 1742 18     Temp 03/18/20 1742 97.6 F (36.4 C)     Temp Source 03/18/20 1742 Oral     SpO2 03/18/20 1742 98 %     Weight --      Height --      Head Circumference --      Peak Flow --      Pain Score 03/18/20 1743 10     Pain Loc --      Pain Edu? --      Excl. in [place]? --   TMAX(24)@       Latest  Blood pressure (!) 169/99, pulse 75, temperature 97.6 F (36.4 C), temperature source Oral, resp. rate (!) 25, SpO2 100 %.       Review of Systems:    Pertinent positives include: ***  Constitutional:  No weight loss, night sweats, Fevers, chills, fatigue, weight loss  HEENT:  No headaches, Difficulty swallowing,Tooth/dental problems,Sore throat,  No sneezing, itching, ear ache, nasal congestion, post nasal drip,  Cardio-vascular:  No chest pain, Orthopnea, PND, anasarca, dizziness, palpitations.no Bilateral lower extremity swelling  GI:  No heartburn, indigestion, abdominal pain, nausea, vomiting, diarrhea, change in bowel habits, loss of appetite, melena, blood in stool, hematemesis Resp:  no shortness of breath at rest. No dyspnea on exertion, No excess mucus, no productive cough, No non-productive cough, No coughing up of blood.No change in color of mucus.No wheezing. Skin:  no rash or lesions. No jaundice GU:  no dysuria, change in color of urine, no urgency or frequency. No straining to urinate.  No flank pain.  Musculoskeletal:  No joint pain or no joint swelling. No decreased range of motion. No back pain.  Psych:  No change in mood or affect. No depression or anxiety. No memory loss.  Neuro: no localizing neurological complaints, no tingling, no weakness, no double vision, no gait  abnormality, no slurred speech, no confusion  All systems reviewed and apart from [place] all are negative  Past Medical History:   Past Medical History:  Diagnosis Date  . Cancer [facility])       Past Surgical History:  Procedure Laterality Date  .  IR IMAGING GUIDED PORT INSERTION  05/10/2019    Social History:  Ambulatory *** independently cane, walker  wheelchair bound, bed bound     reports that she has never smoked. She has never used smokeless tobacco. She reports that she does not drink alcohol and does not use drugs.     Family History: *** No family history on file.  Allergies: No Known Allergies   Prior to Admission medications   Medication Sig Start Date End Date Taking? Authorizing Provider  acyclovir (ZOVIRAX) 200 MG capsule Take 1 capsule (200 mg total) by mouth 2 (two) times daily. 12/01/19   Heilingoetter, Cassandra L, PA-C  blood glucose meter kit and supplies Dispense based on patient and insurance preference. Use up to four times daily as directed. (FOR ICD-10 E10.9, E11.9). 09/28/17   Arrien, Jimmy Picket, MD  dexamethasone (DECADRON) 4 MG tablet Take 10 tablets (40 mg total) by mouth every 7 (seven) days. 01/05/20   Curt Bears, MD  diphenhydrAMINE (BENADRYL ALLERGY) 25 MG tablet Take 1 tablet (25 mg total) by mouth every 6 (six) hours as needed for itching. 10/23/17   Tanner, Lyndon Code., PA-C  furosemide (LASIX) 40 MG tablet Take 40 mg by mouth daily as needed. 05/24/19   [provider]  lidocaine-prilocaine (EMLA) cream Apply 1 application topically as needed. 01/05/20   Curt Bears, MD  metoprolol tartrate (LOPRESSOR) 25 MG tablet TAKE 1/2 TABLET BY MOUTH TWICE DAILY 10/28/19   Curt Bears, MD  omeprazole (PRILOSEC) 20 MG capsule Take 1 capsule (20 mg total) by mouth daily. 12/01/19   Heilingoetter, Cassandra L, PA-C  oxyCODONE-acetaminophen (PERCOCET) 5-325 MG tablet Take 2 tablets by mouth every 6 (six) hours as needed. 02/17/19   Milton Ferguson, MD  prochlorperazine (COMPAZINE) 10 MG tablet Take 1 tablet (10 mg total) by mouth every 6 (six) hours as needed for nausea or vomiting. 01/05/20   Curt Bears, MD  RELION GLUCOSE TEST STRIPS test strip USE 1 TO CHECK GLUCOSE ONCE DAILY 03/06/18   [provider]  ReliOn Ultra Thin Lancets 30G MISC USE 1 TO CHECK GLUCOSE ONCE DAILY FOR GLUCOSE TESTING 03/06/18   [provider]  traMADol (ULTRAM) 50 MG tablet Take 50 mg by mouth every 8 (eight) hours as needed.  03/03/19   [provider]   Physical Exam: Vitals with BMI 03/18/2020 03/18/2020 03/18/2020  Height - - -  Weight - - -  BMI - - -  Systolic 099 833 825  Diastolic 99 96 053  Pulse 75 73 -     1. General:  in No ***Acute distress***increased work of breathing ***complaining of severe pain****agitated * Chronically ill *well *cachectic *toxic acutely ill -appearing 2. Psychological: Alert and *** Oriented 3. Head/ENT:   Moist *** Dry Mucous Membranes                          Head Non traumatic, neck supple                          Normal *** Poor Dentition 4. SKIN: normal *** decreased Skin turgor,  Skin clean Dry and intact no rash 5. Heart: Regular rate and rhythm no*** Murmur, no Rub or gallop 6. Lungs: ***Clear to auscultation bilaterally, no wheezes or crackles   7. Abdomen: Soft, ***non-tender, Non distended *** obese ***bowel sounds present 8. Lower extremities: no clubbing, cyanosis, no ***edema 9. Neurologically Grossly intact,  moving all 4 extremities equally *** strength 5 out of 5 in all 4 extremities cranial nerves II through XII intact 10. MSK: Normal range of motion   All other LABS:     Recent Labs  Lab 03/14/20 0858 03/18/20 1747  WBC 2.4* 7.4  NEUTROABS 1.8  --   HGB 11.2* 11.8*  HCT 34.7* 35.4*  MCV 97.5 95.2  PLT 67* 8*     Recent Labs  Lab 03/14/20 0858 03/18/20 1747  NA 139 127*  K 4.5 4.1  CL 108 98  CO2 23 19*  GLUCOSE 117* 107*  BUN 16 50*   CREATININE 1.24* 3.31*  CALCIUM 8.4* 7.9*     Recent Labs  Lab 03/14/20 0858  AST 17  ALT 11  ALKPHOS 63  BILITOT 0.7  PROT 5.6*  ALBUMIN 3.5       Cultures:    Component Value Date/Time   SDES  10/22/2017 1011    BLOOD Performed at [facility], [place] [address], [place], [place] [address]    SPECREQUEST  10/22/2017 1011    NONE Performed at [facility], [place] [address] [place], [place] [address]    CULT  10/22/2017 1011    NO GROWTH 5 DAYS Performed at [place] [address], [place], [place] [address]    REPTSTATUS 10/27/2017 FINAL 10/22/2017 1011     Radiological Exams on Admission: CT ABDOMEN PELVIS WO CONTRAST  Result Date: 03/18/2020 CLINICAL DATA:  70 year old female with left lower quadrant abdominal pain. Concern for diverticulitis. EXAM: CT ABDOMEN AND PELVIS WITHOUT CONTRAST TECHNIQUE: Multidetector CT imaging of the abdomen and pelvis was performed following the standard protocol without IV contrast. COMPARISON:  None. FINDINGS: Evaluation of this exam is limited in the absence of intravenous contrast. Lower chest: Partially visualized small bilateral pleural effusions with bibasilar subsegmental atelectasis. There is cardiomegaly with dilatation of the right atrium. No intra-abdominal free air. Small ascites. Hepatobiliary: Multiple hepatic cysts and several additional subcentimeter hypodensities which are too small to characterize. No intrahepatic biliary dilatation. The gallbladder is unremarkable. Pancreas: Unremarkable. No pancreatic ductal dilatation or surrounding inflammatory changes. Spleen: Normal in size without focal abnormality. Adrenals/Urinary Tract: Polycystic morphology of the kidneys with replacement of the majority of the renal parenchyma by cysts. Evaluation of the renal cyst is limited on this noncontrast CT. There is no hydronephrosis or nephrolithiasis on either side. The urinary  bladder is grossly unremarkable. Stomach/Bowel: There is sigmoid diverticulosis. There is diffuse inflammatory changes and thickening of the distal descending and sigmoid colon consistent with colitis. Correlation with clinical exam and stool cultures recommended. No bowel obstruction. The appendix is normal. Vascular/Lymphatic: Moderate aortoiliac atherosclerotic disease. The IVC is unremarkable. No portal venous gas. There is no adenopathy. Reproductive: The uterus and ovaries are grossly unremarkable. Other: Mild subcutaneous edema. No fluid collection. Musculoskeletal: Osteopenia. No acute osseous pathology. IMPRESSION: 1. Colitis of the distal descending and sigmoid colon. Correlation with clinical exam and stool cultures recommended. No bowel obstruction. Normal appendix. 2. Sigmoid diverticulosis. 3. Polycystic morphology of the kidneys. 4. Small bilateral pleural effusions with bibasilar subsegmental atelectasis. 5. Aortic Atherosclerosis (ICD10-I70.0). Electronically Signed   By: Anner Crete M.D.   On: 03/18/2020 21:33    Chart has been reviewed    Assessment/Plan  ***  Admitted for ***  Present on Admission: **None**   Other plan as per orders.  DVT prophylaxis:  SCD *** Lovenox  Code Status:    Code Status: Prior FULL CODE *** DNR/DNI ***comfort care as per patient ***family  I had personally discussed CODE STATUS with patient and family* I had spent *min discussing goals of care and CODE STATUS    Family Communication:   Family not at  Bedside  plan of care was discussed on the phone with *** Son, Daughter, Wife, Husband, Sister, Brother , father, mother  Disposition Plan:   *** likely will need placement for rehabilitation                          Back to current facility when stable                            To home once workup is complete and patient is stable  ***Following barriers for discharge:                            Electrolytes corrected                                Anemia corrected                             Pain controlled with PO medications                               Afebrile, white count improving able to transition to PO antibiotics                             Will need to be able to tolerate PO                            Will likely need home health, home O2, set up                           Will need consultants to evaluate patient prior to discharge  ****EXPECT DC tomorrow                    ***Would benefit from PT/OT eval prior to DC  Ordered                   Swallow eval - SLP ordered                   Diabetes care coordinator                   Transition of care consulted                   Nutrition    consulted                  Wound care  consulted                   Palliative care    consulted                   Behavioral health  consulted  Consults called: ***    Admission status:  ED Disposition    None       Obs***  ***  inpatient     I Expect 2 midnight stay secondary to severity of patient's current illness need for inpatient interventions justified by the following: ***hemodynamic instability despite optimal treatment (tachycardia *hypotension * tachypnea *hypoxia, hypercapnia) * Severe lab/radiological/exam abnormalities including:     and extensive comorbidities including: *substance abuse  *Chronic pain *DM2  * CHF * CAD  * COPD/asthma *Morbid Obesity * CKD *dementia *liver disease *history of stroke with residual deficits *  malignancy, * sickle cell disease  . History of amputation . Chronic anticoagulation  That are currently affecting medical management.   I expect  patient to be hospitalized for 2 midnights requiring inpatient medical care.  Patient is at high risk for adverse outcome (such as loss of life or disability) if not treated.  Indication for inpatient stay as follows:  Severe change from baseline regarding mental status Hemodynamic  instability despite maximal medical therapy,  ongoing suicidal ideations,  severe pain requiring acute inpatient management,  inability to maintain oral hydration   persistent chest pain despite medical management Need for operative/procedural  intervention New or worsening hypoxia  Need for IV antibiotics, IV fluids, IV rate controling medications, IV antihypertensives, IV pain medications, IV anticoagulation    Level of care   *** tele  For 12H 24H     medical floor       SDU tele indefinitely please discontinue once patient no longer qualifies COVID-19 Labs    Lab Results  Component Value Date   [place] NEGATIVE 02/22/2020     Precautions: admitted as *** Covid Negative  ***asymptomatic screening protocol****PUI *** covid positive No active isolations ***If Covid PCR is negative  - please DC precautions - would need additional investigation given very high risk for false native test result   PPE: Used by the provider:   N95***P100  eye Goggles,  Gloves ***gown     Critical***  Patient is critically ill due to  hemodynamic instability * respiratory failure *severe sepsis* ongoing chest pain*  They are at high risk for life/limb threatening clinical deterioration requiring frequent reassessment and modifications of care.  Services provided include examination of the patient, review of relevant ancillary tests, prescription of lifesaving therapies, review of medications and prophylactic therapy.  Total critical care time excluding separately billable procedures: 60*  Minutes.    Illyria Sobocinski 03/18/2020, 10:35 PM ***  [facility]     after 2 AM please page floor coverage PA If 7AM-7PM, please contact the day team taking care of the patient using [email or web address]   Patient was evaluated in the context of the global COVID-19 pandemic, which necessitated consideration that the patient might be at risk for infection with the SARS-CoV-2 virus that causes  COVID-19. Institutional protocols and algorithms that pertain to the evaluation of patients at risk for COVID-19 are in a state of rapid change based on information released by regulatory bodies including the CDC and federal and state organizations. These policies and algorithms were followed during the patient's care.

## 2020-03-19 ENCOUNTER — Inpatient Hospital Stay (HOSPITAL_COMMUNITY): Payer: Self-pay

## 2020-03-19 ENCOUNTER — Encounter (HOSPITAL_COMMUNITY): Payer: Self-pay | Admitting: Internal Medicine

## 2020-03-19 ENCOUNTER — Other Ambulatory Visit: Payer: Self-pay | Admitting: Physician Assistant

## 2020-03-19 DIAGNOSIS — I5021 Acute systolic (congestive) heart failure: Secondary | ICD-10-CM

## 2020-03-19 DIAGNOSIS — R319 Hematuria, unspecified: Secondary | ICD-10-CM | POA: Diagnosis present

## 2020-03-19 DIAGNOSIS — E871 Hypo-osmolality and hyponatremia: Secondary | ICD-10-CM | POA: Diagnosis present

## 2020-03-19 DIAGNOSIS — Q613 Polycystic kidney, unspecified: Secondary | ICD-10-CM

## 2020-03-19 DIAGNOSIS — I517 Cardiomegaly: Secondary | ICD-10-CM

## 2020-03-19 DIAGNOSIS — E860 Dehydration: Secondary | ICD-10-CM | POA: Diagnosis present

## 2020-03-19 DIAGNOSIS — K529 Noninfective gastroenteritis and colitis, unspecified: Secondary | ICD-10-CM | POA: Diagnosis present

## 2020-03-19 LAB — DIRECT ANTIGLOBULIN TEST (NOT AT ARMC)
DAT, IgG: NEGATIVE
DAT, complement: NEGATIVE

## 2020-03-19 LAB — D-DIMER, QUANTITATIVE: D-Dimer, Quant: 1.36 ug/mL-FEU — ABNORMAL HIGH (ref 0.00–0.50)

## 2020-03-19 LAB — CBC WITH DIFFERENTIAL/PLATELET
Abs Immature Granulocytes: 0.16 10*3/uL — ABNORMAL HIGH (ref 0.00–0.07)
Abs Immature Granulocytes: 0.15 10*3/uL — ABNORMAL HIGH (ref 0.00–0.07)
Basophils Absolute: 0 10*3/uL (ref 0.0–0.1)
Basophils Absolute: 0 10*3/uL (ref 0.0–0.1)
Basophils Relative: 0 %
Basophils Relative: 0 %
Eosinophils Absolute: 0 10*3/uL (ref 0.0–0.5)
Eosinophils Absolute: 0 10*3/uL (ref 0.0–0.5)
Eosinophils Relative: 0 %
Eosinophils Relative: 0 %
HCT: 35.3 % — ABNORMAL LOW (ref 36.0–46.0)
HCT: 31.9 % — ABNORMAL LOW (ref 36.0–46.0)
Hemoglobin: 11.9 g/dL — ABNORMAL LOW (ref 12.0–15.0)
Hemoglobin: 10.6 g/dL — ABNORMAL LOW (ref 12.0–15.0)
Immature Granulocytes: 2 %
Immature Granulocytes: 1 %
Lymphocytes Relative: 4 %
Lymphocytes Relative: 4 %
Lymphs Abs: 0.3 10*3/uL — ABNORMAL LOW (ref 0.7–4.0)
Lymphs Abs: 0.4 10*3/uL — ABNORMAL LOW (ref 0.7–4.0)
MCH: 32.2 pg (ref 26.0–34.0)
MCH: 31.5 pg (ref 26.0–34.0)
MCHC: 33.7 g/dL (ref 30.0–36.0)
MCHC: 33.2 g/dL (ref 30.0–36.0)
MCV: 95.7 fL (ref 80.0–100.0)
MCV: 94.9 fL (ref 80.0–100.0)
Monocytes Absolute: 0.3 10*3/uL (ref 0.1–1.0)
Monocytes Absolute: 0.3 10*3/uL (ref 0.1–1.0)
Monocytes Relative: 3 %
Monocytes Relative: 3 %
Neutro Abs: 8.4 10*3/uL — ABNORMAL HIGH (ref 1.7–7.7)
Neutro Abs: 10.1 10*3/uL — ABNORMAL HIGH (ref 1.7–7.7)
Neutrophils Relative %: 91 %
Neutrophils Relative %: 92 %
Platelets: 44 10*3/uL — ABNORMAL LOW (ref 150–400)
Platelets: 38 10*3/uL — ABNORMAL LOW (ref 150–400)
RBC: 3.69 MIL/uL — ABNORMAL LOW (ref 3.87–5.11)
RBC: 3.36 MIL/uL — ABNORMAL LOW (ref 3.87–5.11)
RDW: 16 % — ABNORMAL HIGH (ref 11.5–15.5)
RDW: 15.7 % — ABNORMAL HIGH (ref 11.5–15.5)
WBC: 9.2 10*3/uL (ref 4.0–10.5)
WBC: 11 10*3/uL — ABNORMAL HIGH (ref 4.0–10.5)
nRBC: 0.7 % — ABNORMAL HIGH (ref 0.0–0.2)
nRBC: 0.5 % — ABNORMAL HIGH (ref 0.0–0.2)

## 2020-03-19 LAB — BASIC METABOLIC PANEL
Anion gap: 14 (ref 5–15)
Anion gap: 14 (ref 5–15)
BUN: 57 mg/dL — ABNORMAL HIGH (ref 8–23)
BUN: 60 mg/dL — ABNORMAL HIGH (ref 8–23)
CO2: 16 mmol/L — ABNORMAL LOW (ref 22–32)
CO2: 17 mmol/L — ABNORMAL LOW (ref 22–32)
Calcium: 8.3 mg/dL — ABNORMAL LOW (ref 8.9–10.3)
Calcium: 8.2 mg/dL — ABNORMAL LOW (ref 8.9–10.3)
Chloride: 98 mmol/L (ref 98–111)
Chloride: 99 mmol/L (ref 98–111)
Creatinine, Ser: 3.52 mg/dL — ABNORMAL HIGH (ref 0.44–1.00)
Creatinine, Ser: 4.14 mg/dL — ABNORMAL HIGH (ref 0.44–1.00)
GFR, Estimated: 13 mL/min — ABNORMAL LOW (ref >60)
GFR, Estimated: 11 mL/min — ABNORMAL LOW (ref >60)
Glucose, Bld: 133 mg/dL — ABNORMAL HIGH (ref 70–99)
Glucose, Bld: 140 mg/dL — ABNORMAL HIGH (ref 70–99)
Potassium: 4.4 mmol/L (ref 3.5–5.1)
Potassium: 4.7 mmol/L (ref 3.5–5.1)
Sodium: 128 mmol/L — ABNORMAL LOW (ref 135–145)
Sodium: 130 mmol/L — ABNORMAL LOW (ref 135–145)

## 2020-03-19 LAB — COMPREHENSIVE METABOLIC PANEL
ALT: 12 U/L (ref 0–44)
AST: 21 U/L (ref 15–41)
Albumin: 3.1 g/dL — ABNORMAL LOW (ref 3.5–5.0)
Alkaline Phosphatase: 53 U/L (ref 38–126)
Anion gap: 18 — ABNORMAL HIGH (ref 5–15)
BUN: 62 mg/dL — ABNORMAL HIGH (ref 8–23)
CO2: 16 mmol/L — ABNORMAL LOW (ref 22–32)
Calcium: 8.1 mg/dL — ABNORMAL LOW (ref 8.9–10.3)
Chloride: 96 mmol/L — ABNORMAL LOW (ref 98–111)
Creatinine, Ser: 3.87 mg/dL — ABNORMAL HIGH (ref 0.44–1.00)
GFR, Estimated: 12 mL/min — ABNORMAL LOW (ref >60)
Glucose, Bld: 165 mg/dL — ABNORMAL HIGH (ref 70–99)
Potassium: 4.5 mmol/L (ref 3.5–5.1)
Sodium: 130 mmol/L — ABNORMAL LOW (ref 135–145)
Total Bilirubin: 1 mg/dL (ref 0.3–1.2)
Total Protein: 5 g/dL — ABNORMAL LOW (ref 6.5–8.1)

## 2020-03-19 LAB — OSMOLALITY: Osmolality: 288 mOsm/kg (ref 275–295)

## 2020-03-19 LAB — ECHOCARDIOGRAM COMPLETE
Area-P 1/2: 2.01 cm2
P 1/2 time: 643 msec
S' Lateral: 3.7 cm

## 2020-03-19 LAB — DIFFERENTIAL
Abs Immature Granulocytes: 0.06 10*3/uL (ref 0.00–0.07)
Basophils Absolute: 0 10*3/uL (ref 0.0–0.1)
Basophils Relative: 0 %
Eosinophils Absolute: 0.2 10*3/uL (ref 0.0–0.5)
Eosinophils Relative: 2 %
Immature Granulocytes: 1 %
Lymphocytes Relative: 7 %
Lymphs Abs: 0.5 10*3/uL — ABNORMAL LOW (ref 0.7–4.0)
Monocytes Absolute: 0.7 10*3/uL (ref 0.1–1.0)
Monocytes Relative: 9 %
Neutro Abs: 6 10*3/uL (ref 1.7–7.7)
Neutrophils Relative %: 81 %
RBC Morphology: NONE SEEN

## 2020-03-19 LAB — CREATININE, URINE, RANDOM: Creatinine, Urine: 143.98 mg/dL

## 2020-03-19 LAB — FIBRINOGEN: Fibrinogen: 370 mg/dL (ref 210–475)

## 2020-03-19 LAB — MAGNESIUM
Magnesium: 2.1 mg/dL (ref 1.7–2.4)
Magnesium: 2.2 mg/dL (ref 1.7–2.4)

## 2020-03-19 LAB — SARS CORONAVIRUS 2 (TAT 6-24 HRS): SARS Coronavirus 2: POSITIVE — AB

## 2020-03-19 LAB — BRAIN NATRIURETIC PEPTIDE: B Natriuretic Peptide: 1809.1 pg/mL — ABNORMAL HIGH (ref 0.0–100.0)

## 2020-03-19 LAB — LACTIC ACID, PLASMA: Lactic Acid, Venous: 1.7 mmol/L (ref 0.5–1.9)

## 2020-03-19 LAB — SAVE SMEAR(SSMR), FOR PROVIDER SLIDE REVIEW

## 2020-03-19 LAB — TYPE AND SCREEN
ABO/RH(D): O POS
Antibody Screen: NEGATIVE

## 2020-03-19 LAB — CK: Total CK: 36 U/L — ABNORMAL LOW (ref 38–234)

## 2020-03-19 LAB — OSMOLALITY, URINE: Osmolality, Ur: 283 mOsm/kg — ABNORMAL LOW (ref 300–900)

## 2020-03-19 LAB — HIV ANTIBODY (ROUTINE TESTING W REFLEX): HIV Screen 4th Generation wRfx: NONREACTIVE

## 2020-03-19 LAB — LACTATE DEHYDROGENASE: LDH: 461 U/L — ABNORMAL HIGH (ref 98–192)

## 2020-03-19 LAB — SODIUM, URINE, RANDOM: Sodium, Ur: <10 mmol/L

## 2020-03-19 LAB — TSH: TSH: 1.725 u[IU]/mL (ref 0.350–4.500)

## 2020-03-19 LAB — PHOSPHORUS
Phosphorus: 4.2 mg/dL (ref 2.5–4.6)
Phosphorus: 4.8 mg/dL — ABNORMAL HIGH (ref 2.5–4.6)

## 2020-03-19 MED ORDER — SODIUM CHLORIDE 0.9% FLUSH
10.0000 mL | INTRAVENOUS | Status: DC | PRN
  Administered 2020-03-23 – 2020-04-03 (×2): 10 mL

## 2020-03-19 MED ORDER — PIPERACILLIN-TAZOBACTAM IN DEX 2-0.25 GM/50ML IV SOLN
2.2500 g | Freq: Three times a day (TID) | INTRAVENOUS | Status: AC
Start: 2020-03-19 — End: 2020-03-19
  Administered 2020-03-19 (×2): 2.25 g via INTRAVENOUS
  Filled 2020-03-19 (×3): qty 50

## 2020-03-19 MED ORDER — CHLORHEXIDINE GLUCONATE CLOTH 2 % EX PADS
6.0000 | MEDICATED_PAD | Freq: Every day | CUTANEOUS | Status: DC
  Administered 2020-03-20 – 2020-04-03 (×15): 6 via TOPICAL

## 2020-03-19 MED ORDER — HYDROCODONE-ACETAMINOPHEN 5-325 MG PO TABS: 1.0000 | ORAL_TABLET | ORAL | Status: DC | PRN

## 2020-03-19 MED ORDER — PIPERACILLIN-TAZOBACTAM 3.375 G IVPB
3.3750 g | Freq: Two times a day (BID) | INTRAVENOUS | Status: DC
  Administered 2020-03-19 – 2020-03-21 (×4): 3.375 g via INTRAVENOUS
  Filled 2020-03-19 (×4): qty 50

## 2020-03-19 MED ORDER — SODIUM CHLORIDE 0.9 % IV SOLN
75.0000 mL/h | INTRAVENOUS | Status: DC
  Administered 2020-03-19: 75 mL/h via INTRAVENOUS

## 2020-03-19 MED ORDER — METOPROLOL TARTRATE 25 MG PO TABS: 25.0000 mg | ORAL_TABLET | Freq: Two times a day (BID) | ORAL | Status: DC

## 2020-03-19 MED ORDER — LORATADINE 10 MG PO TABS
10.0000 mg | ORAL_TABLET | Freq: Every day | ORAL | Status: DC
Start: 2020-03-19 — End: 2020-04-05
  Administered 2020-03-19 – 2020-04-04 (×17): 10 mg via ORAL
  Filled 2020-03-19 (×17): qty 1

## 2020-03-19 MED ORDER — DEXAMETHASONE SODIUM PHOSPHATE 4 MG/ML IJ SOLN
4.0000 mg | INTRAMUSCULAR | Status: AC
  Administered 2020-03-19 – 2020-03-23 (×5): 4 mg via INTRAVENOUS
  Filled 2020-03-19 (×5): qty 1

## 2020-03-19 MED ORDER — AMLODIPINE BESYLATE 10 MG PO TABS
10.0000 mg | ORAL_TABLET | Freq: Every day | ORAL | Status: DC
  Administered 2020-03-19 – 2020-03-20 (×2): 10 mg via ORAL
  Filled 2020-03-19 (×3): qty 1

## 2020-03-19 MED ORDER — MELATONIN 5 MG PO TABS
10.0000 mg | ORAL_TABLET | Freq: Every evening | ORAL | Status: DC | PRN
  Administered 2020-03-20 – 2020-04-01 (×9): 10 mg via ORAL
  Filled 2020-03-19 (×9): qty 2

## 2020-03-19 MED ORDER — HYDRALAZINE HCL 20 MG/ML IJ SOLN: 10.0000 mg | Freq: Four times a day (QID) | INTRAMUSCULAR | Status: DC | PRN

## 2020-03-19 MED ORDER — LABETALOL HCL 5 MG/ML IV SOLN
10.0000 mg | INTRAVENOUS | Status: DC | PRN
  Administered 2020-03-19 (×3): 10 mg via INTRAVENOUS
  Filled 2020-03-19 (×3): qty 4

## 2020-03-19 MED ORDER — ACETAMINOPHEN 325 MG PO TABS: 650.0000 mg | ORAL_TABLET | Freq: Four times a day (QID) | ORAL | Status: DC | PRN

## 2020-03-19 MED ORDER — SODIUM CHLORIDE 0.9% FLUSH
10.0000 mL | Freq: Two times a day (BID) | INTRAVENOUS | Status: DC
  Administered 2020-03-19 – 2020-04-03 (×7): 10 mL

## 2020-03-19 MED ORDER — FUROSEMIDE 10 MG/ML IJ SOLN
80.0000 mg | Freq: Two times a day (BID) | INTRAMUSCULAR | Status: DC
  Administered 2020-03-19: 80 mg via INTRAVENOUS
  Filled 2020-03-19 (×2): qty 8

## 2020-03-19 MED ORDER — ACYCLOVIR 200 MG PO CAPS
200.0000 mg | ORAL_CAPSULE | Freq: Two times a day (BID) | ORAL | Status: DC
  Administered 2020-03-19 – 2020-04-04 (×32): 200 mg via ORAL
  Filled 2020-03-19 (×33): qty 1

## 2020-03-19 MED ORDER — ACETAMINOPHEN 650 MG RE SUPP: 650.0000 mg | Freq: Four times a day (QID) | RECTAL | Status: DC | PRN

## 2020-03-19 NOTE — Progress Notes (Signed)
PHARMACY NOTE -  [facility] has been assisting with dosing of Zosyn for IAI.  Will adjust dose to 3.375 g IV q12 hr (based on new data for extended infusion in CrCl < 20 ml/min); further renal adjustments per institutional Pharmacy abx protocol  Pharmacy will sign off, following peripherally for culture results or dose adjustments. Please reconsult if a change in clinical status warrants re-evaluation of dosage.  Reuel Boom, PharmD, BCPS [phone] 03/19/2020, 10:20 AM

## 2020-03-19 NOTE — ED Notes (Signed)
Platelets transfusing

## 2020-03-19 NOTE — ED Notes (Signed)
IV team with pt at bedside.

## 2020-03-19 NOTE — Plan of Care (Signed)
  Problem: Education: Goal: Knowledge of risk factors and measures for prevention of condition will improve Outcome: Progressing   Problem: Coping: Goal: Psychosocial and spiritual needs will be supported Outcome: Progressing   Problem: Respiratory: Goal: Will maintain a patent airway Outcome: Progressing Goal: Complications related to the disease process, condition or treatment will be avoided or minimized Outcome: Progressing   

## 2020-03-19 NOTE — Progress Notes (Signed)
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  She has received several lines of chemotherapy but most recently has been on carfilzomib and Cytoxan.  She also receives dexamethasone weekly.  The patient received chemotherapy on 1/7 and 1/8.  Her chemotherapy was then held on 1/13 and 1/14 due to to not feeling well with symptoms including weakness, 10 bowel movements per day, and headache with cough.  She also had thrombocytopenia with a platelet count of 37,000.  She had improvement of her symptoms and platelet count was up 67000 so she received carfilzomib and Cytoxan on 1/19 and 1/20.  The patient now presents to the hospital with generalized weakness.  She is also been having lower abdominal pain and suprapubic burning with discomfort with urination.  CT scan showed colitis of the distal descending sigmoid colon.  Labs showed AKI, sodium 127, and platelet count of 8000.  The patient's QASUO-15 test returned as positive today.   Review of additional lab work showed a negative Coombs test, normal fibrinogen level, mildly elevated D-dimer at 1.36,, elevated LDH of 461. Haptoglobin pending.  Received 1 unit of platelets early this morning.  The patient received Solu-Medrol 125 mg IV on 03/18/2020.  She is now receiving dexamethasone 4 mg daily x5 days.  Repeat CBC from this morning showed improvement of her platelet count up to 44,000.  Discussed lab results with Dr. Julien Nordmann.  CBC    Component Value Date/Time   WBC 11.0 (H) 03/19/2020 1118   RBC 3.36 (L) 03/19/2020 1118   HGB 10.6 (L) 03/19/2020 1118   HGB 11.2 (L) 03/14/2020 0858   HCT 31.9 (L) 03/19/2020 1118   PLT 38 (L) 03/19/2020 1118   PLT 67 (L) 03/14/2020 0858   MCV 94.9 03/19/2020 1118   MCH 31.5 03/19/2020 1118   MCHC 33.2 03/19/2020 1118   RDW 15.7 (H) 03/19/2020 1118   LYMPHSABS 0.4 (L) 03/19/2020 1118   MONOABS 0.3 03/19/2020 1118   EOSABS 0.0 03/19/2020 1118    BASOSABS 0.0 03/19/2020 1118   The patient has baseline thrombocytopenia secondary to her chemotherapy.  However, typically platelets do not drop below about 45,000.  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  Recommend continuation of dexamethasone 4 mg daily for a total of 5 days.  Monitor platelet count closely and transfuse platelets for platelet count less than 20,000 or for active bleeding.  We will continue to monitor the patient's chart and make further recommendations regarding her thrombocytopenia.  Continue treatment of colitis per hospitalist.  Continue hydration and monitor renal function very closely.  If no improvement or worsening, consider nephrology consult.  Mikey Bussing, DNP, AGPCNP-BC, AOCNP

## 2020-03-19 NOTE — ED Notes (Signed)
hospitalist is with pt at bedside.

## 2020-03-19 NOTE — ED Notes (Signed)
Pt's bag of home medications given to pt's son Isaias Sakai

## 2020-03-19 NOTE — ED Notes (Signed)
Pt is having diffuculty sleeping but doesn't want anything. Pt states, " I want natural sleep. Lights dimmed, bed repositioned, pt on monitor x4. Will continue to monitor.

## 2020-03-19 NOTE — Progress Notes (Signed)
PROGRESS NOTE    Debbie Chan  FXT:[id number] DOB: 09-22-1950 DOA: 03/18/2020 PCP: [name], MD   Brief Narrative: Debbie Chan is a 70 y.o. female with a history of multiple myeloma, atrial flutter, chronic anemia. Patient presented secondary to weakness and found to have severe thrombocytopenia and AKI. She was started on IV fluids and given platelet transfusion. While admitted she was incidentally found to be COVID-19 positive.   Assessment & Plan:   Active Problems:   Anemia   AKI (acute kidney injury) ([facility])   Atrial flutter (HCC)   Multiple myeloma without remission (HCC)   Thrombocytopenia (HCC)   Colitis   Hematuria   Polycystic kidney   Hyponatremia   Dehydration   Thrombocytopenia Platelets of 8,000 on admission. Hematology/oncology consulted. Patient transfused 1 unit of platelets. DAT negative. 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 Transfuse for platelets < 20,000 or bleeding -Daily CBC  Colitis Non-specific on CT scan. GI pathogen panel ordered on admission. C. Difficile also ordered. Started on [place] -Follow-up culture results  Acute systolic heart failure New diagnosis. In setting of current chemotherapy. EF of 25-30% which is severely reduced. -Discontinue IV fluids -BNP -IV lasix 80 mg BID -Strict in/out and daily weights, fluid restriction -Cardiology consult in AM  COVID-19 infection Does not appear to have significant symptoms. No pneumonia at this time. On room air. -Monitor for developing symptoms -Baseline CRP  Cystic kidneys Seen on CT abdomen/pelvis. On chart review, it appears this was present in 2019. Will likely need follow-up for this with imaging/urology evaluation.  Multiple myeloma without remission Patient follows with oncology, Dr. Julien Nordmann as an outpatient. Currently on carfilzomib and Cytoxan -Continue dexamethasone  Atrial flutter Not on anticoagulation or rate  control  Chronic anemia Basleine and stable.  AKI Baseline creatinine of 1.2. Creatinine of 3.31 on admission and has been worsening with IV fluids. In setting of diarrhea, however. Chest x-ray significant for cardiomegaly. Transthoracic Echocardiogram significant for acute heart failure -Serial BMP  Hyponatremia Mild. Responded to IV fluids.  Primary hypertension -Discontinue labetalol in setting of acute heart failure -Hydralazine IV prn -Start amlodipine 10 mg daily   DVT prophylaxis: SCDs secondary to thrombocytopenia Code Status:   Code Status: Full Code Family Communication: Son on telephone Disposition Plan: Discharge in several days. Unsure if home vs SNF at this time. Pending continued management of new onset acute heart failure, improvement of AKI   Consultants:   Hematology/Oncology  Procedures:   TRANSTHORACIC ECHOCARDIOGRAM (03/19/2020) IMPRESSIONS    1. Left ventricular ejection fraction, by estimation, is 25 to 30%. The  left ventricle has severely decreased function. The left ventricle  demonstrates global hypokinesis although the septal LV segments appear  slightly worse. Left ventricular diastolic  parameters are consistent with Grade II diastolic dysfunction  (pseudonormalization).  2. Right ventricular systolic function is mildly reduced. The right  ventricular size is moderately enlarged. There is moderately elevated  pulmonary artery systolic pressure.  3. The mitral valve is grossly normal. Mild mitral valve regurgitation.  4. Right atrial size was massively dilated.  5. Tricuspid valve regurgitation is moderate to severe.  6. The aortic valve is tricuspid. There is mild calcification of the  aortic valve. There is mild thickening of the aortic valve. Aortic valve  regurgitation is mild.  7. Aortic dilatation noted. 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  8. The inferior vena cava is normal in size with  greater than  50%  respiratory variability, suggesting right atrial pressure of 3 mmHg  Antimicrobials:  None    Subjective: Patient was somnolent when I saw her.  Objective: Vitals:   03/19/20 0348 03/19/20 0623 03/19/20 0941 03/19/20 1300  BP: (!) 171/116 (!) 174/106 (!) 177/100 (!) 157/90  Pulse: 78 76 71 68  Resp: 19 (!) _0 Temp: 98.1 F (36.7 C)     TempSrc: Oral     SpO2: 96% 100% 99% 98%    Intake/Output Summary (Last 24 hours) at 03/19/2020 1541 Last data filed at 03/19/2020 8295 Gross per 24 hour  Intake 590 ml  Output -  Net 590 ml   There were no vitals filed for this visit.  Examination:  General exam: Appears calm and comfortable Respiratory system: Rhonchi of left upper lung. Respiratory effort normal. Cardiovascular system: S1 & S2 heard, RRR. No murmurs, rubs, gallops or clicks. Gastrointestinal system: Abdomen is nondistended, soft and nontender. No organomegaly or masses felt. Normal bowel sounds heard. Central nervous system: Lethargic but arouses with stimulation. Quickly goes back to sleep.  Musculoskeletal: Edema. No calf tenderness Skin: No cyanosis. No rashes    Data Reviewed: I have personally reviewed following labs and imaging studies  CBC Lab Results  Component Value Date   WBC 11.0 (H) 03/19/2020   RBC 3.36 (L) 03/19/2020   HGB 10.6 (L) 03/19/2020   HCT 31.9 (L) 03/19/2020   MCV 94.9 03/19/2020   MCH 31.5 03/19/2020   PLT 38 (L) 03/19/2020   MCHC 33.2 03/19/2020   RDW 15.7 (H) 03/19/2020   LYMPHSABS 0.4 (L) 03/19/2020   MONOABS 0.3 03/19/2020   EOSABS 0.0 03/19/2020   BASOSABS 0.0 62/13/0865     Last metabolic panel Lab Results  Component Value Date   NA 130 (L) 03/19/2020   K 4.5 03/19/2020   CL 96 (L) 03/19/2020   CO2 16 (L) 03/19/2020   BUN 62 (H) 03/19/2020   CREATININE 3.87 (H) 03/19/2020   GLUCOSE 165 (H) 03/19/2020   GFRNONAA 12 (L) 03/19/2020   GFRAA 42 (L) 11/17/2019   CALCIUM 8.1 (L) 03/19/2020    PHOS 4.8 (H) 03/19/2020   PROT 5.0 (L) 03/19/2020   ALBUMIN 3.1 (L) 03/19/2020   LABGLOB 3.2 12/10/2017   AGRATIO 0.5 (L) 09/19/2017   BILITOT 1.0 03/19/2020   ALKPHOS 53 03/19/2020   AST 21 03/19/2020   ALT 12 03/19/2020   ANIONGAP 18 (H) 03/19/2020    CBG (last 3)  No results for input(s): GLUCAP in the last 72 hours.   GFR: Estimated Creatinine Clearance: 15.7 mL/min (A) (by C-G formula based on SCr of 3.87 mg/dL (H)).  Coagulation Profile: No results for input(s): INR, PROTIME in the last 168 hours.  Recent Results (from the past 240 hour(s))  SARS CORONAVIRUS 2 (TAT 6-24 HRS) Nasopharyngeal Nasopharyngeal Swab     Status: Abnormal   Collection Time: 03/19/20  2:04 AM   Specimen: Nasopharyngeal Swab  Result Value Ref Range Status   SARS Coronavirus 2 POSITIVE (A) NEGATIVE Final    Comment: (NOTE) SARS-CoV-2 target nucleic acids are DETECTED.  The SARS-CoV-2 RNA is generally detectable in upper and lower respiratory specimens during the acute phase of infection. Positive results are indicative of the presence of SARS-CoV-2 RNA. Clinical correlation with patient history and other diagnostic information is  necessary to determine patient infection status. Positive results do not rule out bacterial infection or co-infection with other viruses.  The expected result is Negative.  Fact  Sheet for Patients: SugarRoll.be  Fact Sheet for Healthcare Providers: https://www.woods-mathews.com/  This test is not yet approved or cleared by the [place] FDA and  has been authorized for detection and/or diagnosis of SARS-CoV-2 by FDA under an Emergency Use Authorization (EUA). This EUA will remain  in effect (meaning this test can be used) for the duration of the COVID-19 declaration under Section 564(b)(1) of the Act, 21 U. S.C. section 360bbb-3(b)(1), unless the authorization is terminated or revoked sooner.   Performed at [facility], [place] [address], [place], [place] [address]         Radiology Studies: CT ABDOMEN PELVIS WO CONTRAST  Result Date: 03/18/2020 CLINICAL DATA:  70 year old female with left lower quadrant abdominal pain. Concern for diverticulitis. EXAM: CT ABDOMEN AND PELVIS WITHOUT CONTRAST TECHNIQUE: Multidetector CT imaging of the abdomen and pelvis was performed following the standard protocol without IV contrast. COMPARISON:  None. FINDINGS: Evaluation of this exam is limited in the absence of intravenous contrast. Lower chest: Partially visualized small bilateral pleural effusions with bibasilar subsegmental atelectasis. There is cardiomegaly with dilatation of the right atrium. No intra-abdominal free air. Small ascites. Hepatobiliary: Multiple hepatic cysts and several additional subcentimeter hypodensities which are too small to characterize. No intrahepatic biliary dilatation. The gallbladder is unremarkable. Pancreas: Unremarkable. No pancreatic ductal dilatation or surrounding inflammatory changes. Spleen: Normal in size without focal abnormality. Adrenals/Urinary Tract: Polycystic morphology of the kidneys with replacement of the majority of the renal parenchyma by cysts. Evaluation of the renal cyst is limited on this noncontrast CT. There is no hydronephrosis or nephrolithiasis on either side. The urinary bladder is grossly unremarkable. Stomach/Bowel: There is sigmoid diverticulosis. There is diffuse inflammatory changes and thickening of the distal descending and sigmoid colon consistent with colitis. Correlation with clinical exam and stool cultures recommended. No bowel obstruction. The appendix is normal. Vascular/Lymphatic: Moderate aortoiliac atherosclerotic disease. The IVC is unremarkable. No portal venous gas. There is no adenopathy. Reproductive: The uterus and ovaries are grossly unremarkable. Other: Mild subcutaneous edema. No fluid collection. Musculoskeletal: Osteopenia. No acute  osseous pathology. IMPRESSION: 1. Colitis of the distal descending and sigmoid colon. Correlation with clinical exam and stool cultures recommended. No bowel obstruction. Normal appendix. 2. Sigmoid diverticulosis. 3. Polycystic morphology of the kidneys. 4. Small bilateral pleural effusions with bibasilar subsegmental atelectasis. 5. Aortic Atherosclerosis (ICD10-I70.0). Electronically Signed   By: Anner Crete M.D.   On: 03/18/2020 21:33   DG CHEST PORT 1 VIEW  Result Date: 03/19/2020 CLINICAL DATA:  Multiple myeloma, weakness EXAM: PORTABLE CHEST 1 VIEW COMPARISON:  01/25/2020 FINDINGS: The lungs are symmetrically well expanded. Tiny left pleural effusion is suspected. No pneumothorax. Right internal jugular chest port tip is seen within the superior vena cava, unchanged. Mild cardiomegaly is likely stable when accounting for changes in radiographic technique. Pulmonary vascularity is normal. No acute bone abnormality. IMPRESSION: Stable cardiomegaly.  Tiny left pleural effusion. Electronically Signed   By: Fidela Salisbury MD   On: 03/19/2020 00:55   ECHOCARDIOGRAM COMPLETE  Result Date: 03/19/2020    ECHOCARDIOGRAM REPORT   Patient Name:   Debbie Chan Date of Exam: 03/19/2020 Medical Rec #:  [id number]    Height:       68.0 in Accession #:    [id number]   Weight:       187.0 lb Date of Birth:  [birth year]    BSA:          1.986 m Patient Age:    46  years     BP:           166/87 mmHg Patient Gender: F            HR:           68 bpm. Exam Location:  Inpatient Procedure: 2D Echo, Cardiac Doppler and Color Doppler Indications:    I51.7 Cardiomegaly  History:        Patient has prior history of Echocardiogram examinations, most                 recent 09/21/2017. Cancer.  Sonographer:    Jonelle Sidle Dance Referring Phys: [id number]  1. Left ventricular ejection fraction, by estimation, is 25 to 30%. The left ventricle has severely decreased function. The left ventricle demonstrates  global hypokinesis although the septal LV segments appear slightly worse. Left ventricular diastolic parameters are consistent with Grade II diastolic dysfunction (pseudonormalization).  2. Right ventricular systolic function is mildly reduced. The right ventricular size is moderately enlarged. There is moderately elevated pulmonary artery systolic pressure.  3. The mitral valve is grossly normal. Mild mitral valve regurgitation.  4. Right atrial size was massively dilated.  5. Tricuspid valve regurgitation is moderate to severe.  6. The aortic valve is tricuspid. There is mild calcification of the aortic valve. There is mild thickening of the aortic valve. Aortic valve regurgitation is mild.  7. Aortic dilatation noted. There is mild dilatation of the ascending aorta, measuring 36 mm.  8. The inferior vena cava is normal in size with greater than 50% respiratory variability, suggesting right atrial pressure of 3 mmHg. Comparison(s): Compared to prior study on 11/2583, the LV systolic function is now severely reduced. FINDINGS  Left Ventricle: Left ventricular ejection fraction, by estimation, is 25 to 30%. The left ventricle has severely decreased function. The left ventricle demonstrates global hypokinesis although the septal segments appear slightly worse. The left ventricular internal cavity size was normal in size. There is no left ventricular hypertrophy. Left ventricular diastolic parameters are consistent with Grade II diastolic dysfunction (pseudonormalization). Right Ventricle: The right ventricular size is moderately enlarged. No increase in right ventricular wall thickness. Right ventricular systolic function is mildly reduced. There is moderately elevated pulmonary artery systolic pressure. The tricuspid regurgitant velocity is 3.47 m/s, and with an assumed right atrial pressure of 3 mmHg, the estimated right ventricular systolic pressure is 27.7 mmHg. Left Atrium: Left atrial size was normal in size.  Right Atrium: Right atrial size was massively dilated. Pericardium: There is no evidence of pericardial effusion. Mitral Valve: The mitral valve is grossly normal. There is mild thickening of the mitral valve leaflet(s). Mild mitral valve regurgitation. Tricuspid Valve: The tricuspid valve is normal in structure. Tricuspid valve regurgitation is moderate to severe. Aortic Valve: The aortic valve is tricuspid. There is mild calcification of the aortic valve. There is mild thickening of the aortic valve. Aortic valve regurgitation is mild. Aortic regurgitation PHT measures 643 msec. Pulmonic Valve: The pulmonic valve was normal in structure. Pulmonic valve regurgitation is trivial. Aorta: Aortic dilatation noted. There is mild dilatation of the ascending aorta, measuring 36 mm. Venous: The inferior vena cava is normal in size with greater than 50% respiratory variability, suggesting right atrial pressure of 3 mmHg. IAS/Shunts: There is left bowing of the interatrial septum, suggestive of elevated right atrial pressure. No atrial level shunt detected by color flow Doppler.  LEFT VENTRICLE PLAX 2D LVIDd:         5.10 cm  Diastology LVIDs:         3.70 cm  LV e' medial:    5.77 cm/s LV PW:         1.40 cm  LV E/e' medial:  11.5 LV IVS:        0.90 cm  LV e' lateral:   8.59 cm/s LVOT diam:     2.00 cm  LV E/e' lateral: 7.7 LV SV:         46 LV SV Index:   23 LVOT Area:     3.14 cm  RIGHT VENTRICLE             IVC RV Basal diam:  4.30 cm     IVC diam: 2.20 cm RV Mid diam:    3.30 cm RV S prime:     11.70 cm/s TAPSE (M-mode): 2.2 cm LEFT ATRIUM             Index       RIGHT ATRIUM           Index LA diam:        4.20 cm 2.11 cm/m  RA Area:     48.30 cm LA Vol (A2C):   70.9 ml 35.70 ml/m RA Volume:   222.00 ml 111.77 ml/m LA Vol (A4C):   39.6 ml 19.94 ml/m LA Biplane Vol: 55.1 ml 27.74 ml/m  AORTIC VALVE LVOT Vmax:   73.70 cm/s LVOT Vmean:  51.150 cm/s LVOT VTI:    0.145 m AI PHT:      643 msec  AORTA Ao Root diam:  3.10 cm Ao Asc diam:  3.60 cm MITRAL VALVE               TRICUSPID VALVE MV Area (PHT): 2.01 cm    TR Peak grad:   48.2 mmHg MV Decel Time: 377 msec    TR Vmax:        347.00 cm/s MV E velocity: 66.30 cm/s MV A velocity: 52.10 cm/s  SHUNTS MV E/A ratio:  1.27        Systemic VTI:  0.14 m                            Systemic Diam: 2.00 cm Gwyndolyn Kaufman MD Electronically signed by Gwyndolyn Kaufman MD Signature Date/Time: 03/19/2020/3:14:30 PM    Final         Scheduled Meds: . Chlorhexidine Gluconate Cloth  6 each Topical Daily  . dexamethasone (DECADRON) injection  4 mg Intravenous Q24H  . metoprolol tartrate  25 mg Oral BID  . sodium chloride flush  10-40 mL Intracatheter Q12H   Continuous Infusions: . sodium chloride 75 mL/hr (03/19/20 1103)  . piperacillin-tazobactam (ZOSYN)  IV Stopped (03/19/20 9675)  . piperacillin-tazobactam (ZOSYN)  IV       LOS: 1 day     Cordelia Poche, MD [facility] 03/19/2020, 3:41 PM  If 7PM-7AM, please contact night-coverage [email or web address]

## 2020-03-19 NOTE — Progress Notes (Signed)
  Echocardiogram 2D Echocardiogram has been performed.  Debbie Chan 03/19/2020, 12:18 PM

## 2020-03-19 NOTE — ED Notes (Signed)
No reaction to platelets. Pt rate increased see MAR.

## 2020-03-19 NOTE — Progress Notes (Signed)
Pharmacy Antibiotic Note  Debbie Chan is a 70 y.o. female admitted on 03/18/2020 with intra-abdominal infection.  PMH significant for multiple myeloma.  Presents to ED with c/o abdominal pain and weakness. Pharmacy has been consulted for Zosyn dosing.  Plan: Zosyn 2.25gm IV q8h Follow renal function and adjust dose as needed     Temp (24hrs), Avg:98 F (36.7 C), Min:97.6 F (36.4 C), Max:98.3 F (36.8 C)  Recent Labs  Lab 03/14/20 0858 03/18/20 1747  WBC 2.4* 7.4  CREATININE 1.24* 3.31*    Estimated Creatinine Clearance: 18.3 mL/min (A) (by C-G formula based on SCr of 3.31 mg/dL (H)).    No Known Allergies  Antimicrobials this admission: 1/24 Zosyn >>    Dose adjustments this admission:    Microbiology results: 1/23 UCx:     Thank you for allowing pharmacy to be a part of this patient's care.  Everette Rank, PharmD 03/19/2020 2:28 AM

## 2020-03-20 ENCOUNTER — Inpatient Hospital Stay (HOSPITAL_COMMUNITY): Payer: Self-pay

## 2020-03-20 DIAGNOSIS — N179 Acute kidney failure, unspecified: Secondary | ICD-10-CM

## 2020-03-20 DIAGNOSIS — I071 Rheumatic tricuspid insufficiency: Secondary | ICD-10-CM

## 2020-03-20 DIAGNOSIS — Z8679 Personal history of other diseases of the circulatory system: Secondary | ICD-10-CM

## 2020-03-20 DIAGNOSIS — I50813 Acute on chronic right heart failure: Secondary | ICD-10-CM

## 2020-03-20 DIAGNOSIS — I5041 Acute combined systolic (congestive) and diastolic (congestive) heart failure: Secondary | ICD-10-CM

## 2020-03-20 LAB — GASTROINTESTINAL PANEL BY PCR, STOOL (REPLACES STOOL CULTURE)

## 2020-03-20 LAB — CBC
HCT: 31 % — ABNORMAL LOW (ref 36.0–46.0)
Hemoglobin: 10.4 g/dL — ABNORMAL LOW (ref 12.0–15.0)
MCH: 31.9 pg (ref 26.0–34.0)
MCHC: 33.5 g/dL (ref 30.0–36.0)
MCV: 95.1 fL (ref 80.0–100.0)
Platelets: 35 10*3/uL — ABNORMAL LOW (ref 150–400)
RBC: 3.26 MIL/uL — ABNORMAL LOW (ref 3.87–5.11)
RDW: 15.8 % — ABNORMAL HIGH (ref 11.5–15.5)
WBC: 12.9 10*3/uL — ABNORMAL HIGH (ref 4.0–10.5)
nRBC: 0.9 % — ABNORMAL HIGH (ref 0.0–0.2)

## 2020-03-20 LAB — COMPREHENSIVE METABOLIC PANEL
ALT: 11 U/L (ref 0–44)
AST: 17 U/L (ref 15–41)
Albumin: 2.9 g/dL — ABNORMAL LOW (ref 3.5–5.0)
Alkaline Phosphatase: 47 U/L (ref 38–126)
Anion gap: 14 (ref 5–15)
BUN: 71 mg/dL — ABNORMAL HIGH (ref 8–23)
CO2: 16 mmol/L — ABNORMAL LOW (ref 22–32)
Calcium: 8.4 mg/dL — ABNORMAL LOW (ref 8.9–10.3)
Chloride: 100 mmol/L (ref 98–111)
Creatinine, Ser: 4.43 mg/dL — ABNORMAL HIGH (ref 0.44–1.00)
GFR, Estimated: 10 mL/min — ABNORMAL LOW (ref >60)
Glucose, Bld: 127 mg/dL — ABNORMAL HIGH (ref 70–99)
Potassium: 4.7 mmol/L (ref 3.5–5.1)
Sodium: 130 mmol/L — ABNORMAL LOW (ref 135–145)
Total Bilirubin: 0.9 mg/dL (ref 0.3–1.2)
Total Protein: 4.9 g/dL — ABNORMAL LOW (ref 6.5–8.1)

## 2020-03-20 LAB — PREPARE PLATELET PHERESIS: Unit division: 0

## 2020-03-20 LAB — BPAM PLATELET PHERESIS
Blood Product Expiration Date: 202201272359
ISSUE DATE / TIME: 202201240116
Unit Type and Rh: 9500

## 2020-03-20 LAB — URINE CULTURE: Culture: <10000 — AB

## 2020-03-20 LAB — HAPTOGLOBIN: Haptoglobin: <10 mg/dL — ABNORMAL LOW (ref 37–355)

## 2020-03-20 LAB — STOOL CULTURE

## 2020-03-20 LAB — PROTEIN / CREATININE RATIO, URINE
Creatinine, Urine: 72.01 mg/dL
Protein Creatinine Ratio: 1.89 mg/mg{Cre} — ABNORMAL HIGH (ref 0.00–0.15)
Total Protein, Urine: 136 mg/dL

## 2020-03-20 LAB — C-REACTIVE PROTEIN: CRP: 0.9 mg/dL (ref <1.0)

## 2020-03-20 MED ORDER — HYDRALAZINE HCL 25 MG PO TABS
25.0000 mg | ORAL_TABLET | Freq: Three times a day (TID) | ORAL | Status: DC
  Administered 2020-03-20 – 2020-03-21 (×3): 25 mg via ORAL
  Filled 2020-03-20 (×3): qty 1

## 2020-03-20 MED ORDER — ISOSORBIDE MONONITRATE ER 30 MG PO TB24
30.0000 mg | ORAL_TABLET | Freq: Every day | ORAL | Status: DC
  Administered 2020-03-20 – 2020-03-21 (×2): 30 mg via ORAL
  Filled 2020-03-20 (×2): qty 1

## 2020-03-20 MED ORDER — STERILE WATER FOR INJECTION IV SOLN
INTRAVENOUS | Status: DC
  Filled 2020-03-20 (×4): qty 150

## 2020-03-20 NOTE — Plan of Care (Signed)
  Problem: Education: Goal: Knowledge of risk factors and measures for prevention of condition will improve Outcome: Progressing   Problem: Coping: Goal: Psychosocial and spiritual needs will be supported Outcome: Progressing   Problem: Respiratory: Goal: Will maintain a patent airway Outcome: Progressing Goal: Complications related to the disease process, condition or treatment will be avoided or minimized Outcome: Progressing   

## 2020-03-20 NOTE — Evaluation (Signed)
Physical Therapy One Time Evaluation Patient Details Name: Debbie Chan MRN: [id number] DOB: 19-Jan-1951 Today's Date: 03/20/2020   History of Present Illness  70 y.o. female with a history of multiple myeloma, atrial flutter, chronic anemia. Patient presented secondary to weakness and found to have severe thrombocytopenia and AKI. She was started on IV fluids and given platelet transfusion. While admitted she was incidentally found to be COVID-19 positive.  Clinical Impression  Patient evaluated by Physical Therapy with no further acute PT needs identified. All education has been completed and the patient has no further questions.  Pt ambulated around room without difficulty and used bathroom without assist.  Pt plans to return home with family upon d/c. See below for any follow-up Physical Therapy or equipment needs. PT is signing off. Thank you for this referral.     Follow Up Recommendations No PT follow up    Equipment Recommendations  None recommended by PT    Recommendations for Other Services       Precautions / Restrictions Precautions Precautions: Fall      Mobility  Bed Mobility Overal bed mobility: Modified Independent                  Transfers Overall transfer level: Modified independent                  Ambulation/Gait Ambulation/Gait assistance: Supervision Gait Distance (Feet): 60 Feet (x2) Assistive device: None Gait Pattern/deviations: Step-through pattern;Decreased stride length;Drifts right/left     General Gait Details: slow but steady gait; pt ambulate around room and then used bathroom independently  Stairs            Wheelchair Mobility    Modified Rankin (Stroke Patients Only)       Balance Overall balance assessment: No apparent balance deficits (not formally assessed)                                           Pertinent Vitals/Pain Pain Assessment: No/denies pain    Home Living Family/patient  expects to be discharged to:: Private residence Living Arrangements: Children;Other relatives Available Help at Discharge: Family;Available PRN/intermittently Type of Home: Apartment       Home Layout: Able to live on main level with bedroom/bathroom Home Equipment: None      Prior Function Level of Independence: Independent               Hand Dominance        Extremity/Trunk Assessment   Upper Extremity Assessment Upper Extremity Assessment: Overall WFL for tasks assessed    Lower Extremity Assessment Lower Extremity Assessment: Overall WFL for tasks assessed    Cervical / Trunk Assessment Cervical / Trunk Assessment: Normal  Communication   Communication: Prefers language other than English  Cognition Arousal/Alertness: Awake/alert Behavior During Therapy: WFL for tasks assessed/performed Overall Cognitive Status: Within Functional Limits for tasks assessed                                        General Comments      Exercises     Assessment/Plan    PT Assessment Patent does not need any further PT services  PT Problem List         PT Treatment Interventions      PT Goals (  Current goals can be found in the Care Plan section)  Acute Rehab PT Goals PT Goal Formulation: All assessment and education complete, DC therapy    Frequency     Barriers to discharge        Co-evaluation               AM-PAC PT "6 Clicks" Mobility  Outcome Measure Help needed turning from your back to your side while in a flat bed without using bedrails?: None Help needed moving from lying on your back to sitting on the side of a flat bed without using bedrails?: None Help needed moving to and from a bed to a chair (including a wheelchair)?: None Help needed standing up from a chair using your arms (e.g., wheelchair or bedside chair)?: None Help needed to walk in hospital room?: None Help needed climbing 3-5 steps with a railing? : A Little 6  Click Score: 23    End of Session   Activity Tolerance: Patient tolerated treatment well Patient left: in bed;with call bell/phone within reach;with bed alarm set (sitting EOB) Nurse Communication: Mobility status PT Visit Diagnosis: Difficulty in walking, not elsewhere classified (R26.2)    Time: 0172-0910 PT Time Calculation (min) (ACUTE ONLY): 17 min   Charges:   PT Evaluation $PT Eval Low Complexity: 1 Low         Kati PT, DPT Acute Rehabilitation Services Pager: [phone] Office: [phone]  York Ram E 03/20/2020, 4:13 PM

## 2020-03-20 NOTE — Consult Note (Addendum)
Renal Service Consult Note [facility]  [place] D[place] 03/20/2020 Sol Blazing, MD Requesting Physician: Dr. Lonny Prude  Reason for Consult: Renal failure HPI: The patient is a 70 y.o. year-old w/ hx of multiple myeloma, atrial flutter, anemia presented w/ diarrhea, poor po intake and was found to have AKI and low platelets. Given IVF"s and plt transfusion. Found to be COVID + as well. ECHO showed new low EF 25-30% which is new. Pt is on carfilzomib and Cytoxan and decadron as Rx for myeloma. B/L creat is 1.2, was 3.3 on admission yesterday. Asked to see renal failure.    Pt seen in room, sitting up on side of bed, not in distress, calm and pleasant.  Minimal hx given language barrier.   Pt seen in July 2019 w/ SOB, pleural effusion on CXR > CT chest/ abd showing diffuse bony lesions, skeletal survey confirmed bony lesions. M-spike 4.2. creat 1.8.  BM shoed IgA mult myeloma. ChemoRx started/ w velcade, decadron, then Revlimid added when renal fxn improved, q 3 wks.  This was used until 03/2019 when disease progression was noted. Switched to [employer] and decadron, dc'd due to allergy. Switched to carfilzomib and IV cytoxan w/ decadron, every 4 wks in 04/2019.     ROS n/a   Past Medical History  Past Medical History:  Diagnosis Date  . Cancer [facility])    Past Surgical History  Past Surgical History:  Procedure Laterality Date  . IR IMAGING GUIDED PORT INSERTION  05/10/2019   Family History  Family History  Problem Relation Age of Onset  . Hypertension Other    Social History  reports that she has never smoked. She has never used smokeless tobacco. She reports that she does not drink alcohol and does not use drugs. Allergies No Known Allergies Home medications Prior to Admission medications   Medication Sig Start Date End Date Taking? Authorizing Provider  acyclovir (ZOVIRAX) 200 MG capsule Take 1 capsule (200 mg total) by mouth 2 (two) times daily. 12/01/19  Yes  Heilingoetter, Cassandra L, PA-C  dexamethasone (DECADRON) 4 MG tablet Take 10 tablets (40 mg total) by mouth every 7 (seven) days. 01/05/20  Yes Curt Bears, MD  diphenhydrAMINE (BENADRYL ALLERGY) 25 MG tablet Take 1 tablet (25 mg total) by mouth every 6 (six) hours as needed for itching. 10/23/17  Yes Tanner, Lyndon Code., PA-C  furosemide (LASIX) 40 MG tablet Take 40 mg by mouth daily as needed. 05/24/19  Yes [provider]  loratadine (CLARITIN) 10 MG tablet Take 10 mg by mouth daily.   Yes [provider]  MELATONIN PO Take 10 mg by mouth at bedtime as needed (sleep).   Yes [provider]  metoprolol tartrate (LOPRESSOR) 25 MG tablet TAKE 1/2 TABLET BY MOUTH TWICE DAILY Patient taking differently: 25 mg. 10/28/19  Yes Curt Bears, MD  omeprazole (PRILOSEC) 20 MG capsule Take 1 capsule (20 mg total) by mouth daily. 12/01/19  Yes Heilingoetter, Cassandra L, PA-C  blood glucose meter kit and supplies Dispense based on patient and insurance preference. Use up to four times daily as directed. (FOR ICD-10 E10.9, E11.9). 09/28/17   Arrien, Jimmy Picket, MD  lidocaine-prilocaine (EMLA) cream Apply 1 application topically as needed. Patient not taking: Reported on 03/19/2020 01/05/20   Curt Bears, MD  oxyCODONE-acetaminophen (PERCOCET) 5-325 MG tablet Take 2 tablets by mouth every 6 (six) hours as needed. Patient not taking: No sig reported 02/17/19   Milton Ferguson, MD  prochlorperazine (COMPAZINE) 10 MG tablet  Take 1 tablet (10 mg total) by mouth every 6 (six) hours as needed for nausea or vomiting. Patient not taking: No sig reported 01/05/20   Curt Bears, MD  RELION GLUCOSE TEST STRIPS test strip USE 1 TO CHECK GLUCOSE ONCE DAILY 03/06/18   [provider]  ReliOn Ultra Thin Lancets 30G MISC USE 1 TO CHECK GLUCOSE ONCE DAILY FOR GLUCOSE TESTING 03/06/18   [provider]     Vitals:   03/19/20 1915 03/19/20 2308 03/20/20 0332 03/20/20 0626   BP:  (!) 141/82 (!) 151/82 (!) 145/92  Pulse:  70 77 75  Resp:  '20 20 20  ' Temp:  98.2 F (36.8 C) (!) 97.5 F (36.4 C) (!) 97.2 F (36.2 C)  TempSrc:  Oral Oral Oral  SpO2:  97% 94% 99%  Weight: 87 kg     Height: '5\' 7"'  (1.702 m)      Exam  alert, nad   no jvd  Chest cta bilat  Cor reg no RG  Abd soft ntnd no ascites   Ext no LE edema   Alert, NF, ox3, no asterixis     Home meds:  - lasix 40 qd/ metoprolol 25 bid  - prilosec 20/ decadron 77m q 7d/ zovirax 200 bid  - chemoRx = Cytoxan, decadron, carfilzomib     UA 11-20 rbc, 0-5 wbc, rare bact, > 300 prot    UNa < 10, UCr 143      ECHO 1/24 - LVEF 25-30%      CXR - IMPRESSION: Shallow inspiration with probable small left and trace right pleural effusions. No significant interval change since the prior Radiograph       CT abd - Adrenals/Urinary Tract: Polycystic morphology of the kidneys with replacement of the majority of the renal parenchyma by cysts. Evaluation of the renal cyst is limited on this noncontrast CT. There is no hydronephrosis or nephrolithiasis on either side. The urinary bladder is grossly unremarkable.        Na 130  K 4.7  CO2 16  AG 14   BUN 71  Cr 4.43  Alb 2.9  Ca 8.4 corr Ca 9.2   Tbili 1.0  Phos 4.8   WBC 11K plt 38k  Hb 10.6           Date  Creat  eGFR         2019- 01/2020 1.15- 1.45 32- 50 ml/min          Jan 7  1.17  51, IIIa         Mar 14, 2020 1.24  47, IIIa         Jan 23  3.31  15         Jan 24  3.52         Jan 25   4.43  10  Assessment/ Plan: 1. AKI on CKD 3b - b/l creat 1.1- 1.4 in Dec 2021, eGFR 32- 50. Creat 3.3 on admit 1/23 and up to 4.43 today.  UA w/ rbc's and proteinuria. Will quantitate proteinuria. 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 Haptoglobin pending. Hem- Onc is following. UNa low. Euvolemic to possibly slightly dry on exam.  Will dc lasix IV and start on bicarb gtt at 75 cc/hr, watch exam closely. Get renal [place] Will  follow.  2. COVID infection - asymptomatic, per pmd 3. Acute syst CHF - LVEF 25-30%, no vol overload on exam 4. Multiple  myeloma w/o remission - per ONC, on cytoxan/ carfilzomib/ steroids 5. Thrombocytopenia - acute on chronic, per Hem-onc.  6. Anemia - Hb 10's 7. HTN - getting norvasc, BP's slightly up      Kelly Splinter  MD 03/20/2020, 7:42 AM  Recent Labs  Lab 03/19/20 1118 03/20/20 0414  WBC 11.0* 12.9*  HGB 10.6* 10.4*   Recent Labs  Lab 03/19/20 0624 03/19/20 1118 03/19/20 1544 03/20/20 0414  K  --  4.5 4.7 4.7  BUN  --  62* 60* 71*  CREATININE  --  3.87* 4.14* 4.43*  CALCIUM  --  8.1* 8.2* 8.4*  PHOS 4.2 4.8*  --   --

## 2020-03-20 NOTE — Progress Notes (Signed)
PROGRESS NOTE    [name]  ZOX:[id number] DOB: [birth year] DOA: 03/18/2020 PCP: [name], MD   Brief Narrative: Debbie Chan is a 70 y.o. female with a history of multiple myeloma, atrial flutter, chronic anemia. Patient presented secondary to weakness and found to have severe thrombocytopenia and AKI. She was started on IV fluids and given platelet transfusion. While admitted she was incidentally found to be COVID-19 positive.   Assessment & Plan:   Active Problems:   Anemia   AKI (acute kidney injury) ([place])   Atrial flutter (HCC)   Multiple myeloma without remission (HCC)   Thrombocytopenia (HCC)   Colitis   Hematuria   Polycystic kidney   Hyponatremia   Dehydration   Thrombocytopenia Platelets of 8,000 on admission. Hematology/oncology consulted. Patient transfused 1 unit of platelets. DAT negative. 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 Transfuse for platelets < 20,000 or bleeding -Daily CBC  Colitis Non-specific on CT scan. GI pathogen panel ordered on admission. C. Difficile also ordered. Started on [place] -Follow-up culture results  Acute systolic heart failure New diagnosis. In setting of current chemotherapy. EF of 25-30% which is severely reduced. BNP elevated at 1809. Discontinued IV fluids and started Lasix IV on 1/24. Cardiology consulted 1/25. -Cardiology recommendations: may need to adjust MM treatment, Transesophageal Echocardiogram in future. Started hydralazine and imdur. Consideration of R heart cath in the future -Strict in/out and daily weights  COVID-19 infection Does not appear to have significant symptoms. No pneumonia at this time. On room air. CRP low -Monitor for developing symptoms  Cystic kidneys Seen on CT abdomen/pelvis. On chart review, it appears this was present in 2019. Will likely need follow-up for this with imaging/urology evaluation.  Multiple myeloma without  remission Patient follows with oncology, Dr. Julien Nordmann as an outpatient. Currently on carfilzomib and Cytoxan which may have contributed to heart failure. -Continue dexamethasone  Paroxysmal atrial flutter Not on anticoagulation or rate control  Chronic anemia [place] and stable.  AKI on CKD IIIa Baseline creatinine of 1.2. Creatinine of 3.31 on admission and has been worsening with IV fluids. In setting of diarrhea, however. Also in setting of multiple myeloma. Chest x-ray significant for cardiomegaly. Transthoracic Echocardiogram significant for acute heart failure. Started on IV lasix for acute heart failure -Nephrology recommendations: sodium bicarb IV fluids  Hyponatremia Mild. Responded to IV fluids.  Primary hypertension -Hydralazine IV prn -Cardiology recommendations: hydralazine/Imdur   DVT prophylaxis: SCDs secondary to thrombocytopenia Code Status:   Code Status: Full Code Family Communication: Son on telephone Disposition Plan: Discharge in several days. Unsure if home vs SNF at this time. Pending continued management of new onset acute heart failure, improvement of AKI   Consultants:   Hematology/Oncology  Procedures:   TRANSTHORACIC ECHOCARDIOGRAM (03/19/2020) IMPRESSIONS    1. Left ventricular ejection fraction, by estimation, is 25 to 30%. The  left ventricle has severely decreased function. The left ventricle  demonstrates global hypokinesis although the septal LV segments appear  slightly worse. Left ventricular diastolic  parameters are consistent with Grade II diastolic dysfunction  (pseudonormalization).  2. Right ventricular systolic function is mildly reduced. The right  ventricular size is moderately enlarged. There is moderately elevated  pulmonary artery systolic pressure.  3. The mitral valve is grossly normal. Mild mitral valve regurgitation.  4. Right atrial size was massively dilated.  5. Tricuspid valve regurgitation is moderate to  severe.  6. The aortic valve is tricuspid. There is mild calcification of the  aortic valve.  There is mild thickening of the aortic valve. Aortic valve  regurgitation is mild.  7. Aortic dilatation noted. 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  8. The inferior vena cava is normal in size with greater than 50%  respiratory variability, suggesting right atrial pressure of 3 mmHg  Antimicrobials:  None    Subjective: Language barrier. Attempted to use phone interpreter but failed. Son called at baseline to help with language barrier.  No issues overnight. No dyspnea or chest pain.  Objective: Vitals:   03/19/20 1915 03/19/20 2308 03/20/20 0332 03/20/20 0626  BP:  (!) 141/82 (!) 151/82 (!) 145/92  Pulse:  70 77 75  Resp:  '20 20 20  ' Temp:  98.2 F (36.8 C) (!) 97.5 F (36.4 C) (!) 97.2 F (36.2 C)  TempSrc:  Oral Oral Oral  SpO2:  97% 94% 99%  Weight: 87 kg     Height: '5\' 7"'  (1.702 m)       Intake/Output Summary (Last 24 hours) at 03/20/2020 7124 Last data filed at 03/20/2020 0000 Gross per 24 hour  Intake -  Output 150 ml  Net -150 ml   Filed Weights   03/19/20 1915  Weight: 87 kg    Examination:  General exam: Appears calm and comfortable Respiratory system: Clear to auscultation. Respiratory effort normal. Cardiovascular system: S1 & S2 heard, RRR. No murmurs, rubs, gallops or clicks. Gastrointestinal system: Abdomen is nondistended, soft and nontender. No organomegaly or masses felt. Normal bowel sounds heard. Central nervous system: Alert and oriented. No focal neurological deficits. Musculoskeletal: No edema. No calf tenderness Skin: No cyanosis. No rashes Psychiatry: Judgement and insight appear normal. Mood & affect appropriate.    Data Reviewed: I have personally reviewed following labs and imaging studies  CBC Lab Results  Component Value Date   WBC 12.9 (H) 03/20/2020   RBC 3.26 (L) 03/20/2020   HGB 10.4 (L)  03/20/2020   HCT 31.0 (L) 03/20/2020   MCV 95.1 03/20/2020   MCH 31.9 03/20/2020   PLT 35 (L) 03/20/2020   MCHC 33.5 03/20/2020   RDW 15.8 (H) 03/20/2020   LYMPHSABS 0.4 (L) 03/19/2020   MONOABS 0.3 03/19/2020   EOSABS 0.0 03/19/2020   BASOSABS 0.0 58/10/9831     Last metabolic panel Lab Results  Component Value Date   NA 130 (L) 03/20/2020   K 4.7 03/20/2020   CL 100 03/20/2020   CO2 16 (L) 03/20/2020   BUN 71 (H) 03/20/2020   CREATININE 4.43 (H) 03/20/2020   GLUCOSE 127 (H) 03/20/2020   GFRNONAA 10 (L) 03/20/2020   GFRAA 42 (L) 11/17/2019   CALCIUM 8.4 (L) 03/20/2020   PHOS 4.8 (H) 03/19/2020   PROT 4.9 (L) 03/20/2020   ALBUMIN 2.9 (L) 03/20/2020   LABGLOB 3.2 12/10/2017   AGRATIO 0.5 (L) 09/19/2017   BILITOT 0.9 03/20/2020   ALKPHOS 47 03/20/2020   AST 17 03/20/2020   ALT 11 03/20/2020   ANIONGAP 14 03/20/2020    CBG (last 3)  No results for input(s): GLUCAP in the last 72 hours.   GFR: Estimated Creatinine Clearance: 13.6 mL/min (A) (by C-G formula based on SCr of 4.43 mg/dL (H)).  Coagulation Profile: No results for input(s): INR, PROTIME in the last 168 hours.  Recent Results (from the past 240 hour(s))  Urine culture     Status: Abnormal   Collection Time: 03/18/20  6:57 PM   Specimen: Urine, Random  Result Value Ref Range Status  Specimen Description   Final    URINE, RANDOM Performed at [facility], [place] [address], [place], [place] [address]    Special Requests   Final    NONE Performed at [facility], [place] [address] [place], [place] [address]    Culture (A)  Final    <10,000 COLONIES/mL INSIGNIFICANT GROWTH Performed at [place] [address], [place], [place] [address]    Report Status 03/20/2020 FINAL  Final  SARS CORONAVIRUS 2 (TAT 6-24 HRS) Nasopharyngeal Nasopharyngeal Swab     Status: Abnormal   Collection Time: 03/19/20  2:04 AM   Specimen: Nasopharyngeal Swab  Result Value Ref  Range Status   SARS Coronavirus 2 POSITIVE (A) NEGATIVE Final    Comment: (NOTE) SARS-CoV-2 target nucleic acids are DETECTED.  The SARS-CoV-2 RNA is generally detectable in upper and lower respiratory specimens during the acute phase of infection. Positive results are indicative of the presence of SARS-CoV-2 RNA. Clinical correlation with patient history and other diagnostic information is  necessary to determine patient infection status. Positive results do not rule out bacterial infection or co-infection with other viruses.  The expected result is Negative.  Fact Sheet for Patients: SugarRoll.be  Fact Sheet for Healthcare Providers: https://www.woods-mathews.com/  This test is not yet approved or cleared by the [place] FDA and  has been authorized for detection and/or diagnosis of SARS-CoV-2 by FDA under an Emergency Use Authorization (EUA). This EUA will remain  in effect (meaning this test can be used) for the duration of the COVID-19 declaration under Section 564(b)(1) of the Act, 21 U. S.C. section 360bbb-3(b)(1), unless the authorization is terminated or revoked sooner.   Performed at [facility], [place] [address] [place],  [address]         Radiology Studies: CT ABDOMEN PELVIS WO CONTRAST  Result Date: 03/18/2020 CLINICAL DATA:  69 year old female with left lower quadrant abdominal pain. Concern for diverticulitis. EXAM: CT ABDOMEN AND PELVIS WITHOUT CONTRAST TECHNIQUE: Multidetector CT imaging of the abdomen and pelvis was performed following the standard protocol without IV contrast. COMPARISON:  None. FINDINGS: Evaluation of this exam is limited in the absence of intravenous contrast. Lower chest: Partially visualized small bilateral pleural effusions with bibasilar subsegmental atelectasis. There is cardiomegaly with dilatation of the right atrium. No intra-abdominal free air. Small ascites. Hepatobiliary:  Multiple hepatic cysts and several additional subcentimeter hypodensities which are too small to characterize. No intrahepatic biliary dilatation. The gallbladder is unremarkable. Pancreas: Unremarkable. No pancreatic ductal dilatation or surrounding inflammatory changes. Spleen: Normal in size without focal abnormality. Adrenals/Urinary Tract: Polycystic morphology of the kidneys with replacement of the majority of the renal parenchyma by cysts. Evaluation of the renal cyst is limited on this noncontrast CT. There is no hydronephrosis or nephrolithiasis on either side. The urinary bladder is grossly unremarkable. Stomach/Bowel: There is sigmoid diverticulosis. There is diffuse inflammatory changes and thickening of the distal descending and sigmoid colon consistent with colitis. Correlation with clinical exam and stool cultures recommended. No bowel obstruction. The appendix is normal. Vascular/Lymphatic: Moderate aortoiliac atherosclerotic disease. The IVC is unremarkable. No portal venous gas. There is no adenopathy. Reproductive: The uterus and ovaries are grossly unremarkable. Other: Mild subcutaneous edema. No fluid collection. Musculoskeletal: Osteopenia. No acute osseous pathology. IMPRESSION: 1. Colitis of the distal descending and sigmoid colon. Correlation with clinical exam and stool cultures recommended. No bowel obstruction. Normal appendix. 2. Sigmoid diverticulosis. 3. Polycystic morphology of the kidneys. 4. Small  bilateral pleural effusions with bibasilar subsegmental atelectasis. 5. Aortic Atherosclerosis (ICD10-I70.0). Electronically Signed   By: Anner Crete M.D.   On: 03/18/2020 21:33   DG CHEST PORT 1 VIEW  Result Date: 03/19/2020 CLINICAL DATA:  70 year old female with weakness. EXAM: PORTABLE CHEST 1 VIEW COMPARISON:  Chest radiograph dated 03/19/2020. FINDINGS: Right sided Port-A-Cath in similar position. There is shallow inspiration. Probable small left and trace right pleural  effusions. Minimal left lung base atelectasis. Pneumonia is not excluded. No pneumothorax. No acute osseous pathology. Osteopenia. IMPRESSION: Shallow inspiration with probable small left and trace right pleural effusions. No significant interval change since the prior radiograph. Electronically Signed   By: Anner Crete M.D.   On: 03/19/2020 16:58   DG CHEST PORT 1 VIEW  Result Date: 03/19/2020 CLINICAL DATA:  Multiple myeloma, weakness EXAM: PORTABLE CHEST 1 VIEW COMPARISON:  01/25/2020 FINDINGS: The lungs are symmetrically well expanded. Tiny left pleural effusion is suspected. No pneumothorax. Right internal jugular chest port tip is seen within the superior vena cava, unchanged. Mild cardiomegaly is likely stable when accounting for changes in radiographic technique. Pulmonary vascularity is normal. No acute bone abnormality. IMPRESSION: Stable cardiomegaly.  Tiny left pleural effusion. Electronically Signed   By: Fidela Salisbury MD   On: 03/19/2020 00:55   ECHOCARDIOGRAM COMPLETE  Result Date: 03/19/2020    ECHOCARDIOGRAM REPORT   Patient Name:   CAMPBELL AGRAMONTE Date of Exam: 03/19/2020 Medical Rec #:  [id number]    Height:       68.0 in Accession #:    [id number]   Weight:       187.0 lb Date of Birth:  1951-01-11    BSA:          1.986 m Patient Age:    70 years     BP:           166/87 mmHg Patient Gender: F            HR:           68 bpm. Exam Location:  Inpatient Procedure: 2D Echo, Cardiac Doppler and Color Doppler Indications:    I51.7 Cardiomegaly  History:        Patient has prior history of Echocardiogram examinations, most                 recent 09/21/2017. Cancer.  Sonographer:    Jonelle Sidle Dance Referring Phys: [id number] [place]  1. Left ventricular ejection fraction, by estimation, is 25 to 30%. The left ventricle has severely decreased function. The left ventricle demonstrates global hypokinesis although the septal LV segments appear slightly worse. Left ventricular  diastolic parameters are consistent with Grade II diastolic dysfunction (pseudonormalization).  2. Right ventricular systolic function is mildly reduced. The right ventricular size is moderately enlarged. There is moderately elevated pulmonary artery systolic pressure.  3. The mitral valve is grossly normal. Mild mitral valve regurgitation.  4. Right atrial size was massively dilated.  5. Tricuspid valve regurgitation is moderate to severe.  6. The aortic valve is tricuspid. There is mild calcification of the aortic valve. There is mild thickening of the aortic valve. Aortic valve regurgitation is mild.  7. Aortic dilatation noted. There is mild dilatation of the ascending aorta, measuring 36 mm.  8. The inferior vena cava is normal in size with greater than 50% respiratory variability, suggesting right atrial pressure of 3 mmHg. Comparison(s): Compared to prior study on 65/9935, the LV systolic function is now severely reduced. FINDINGS  Left Ventricle: Left ventricular ejection fraction, by estimation, is 25 to 30%. The left ventricle has severely decreased function. The left ventricle demonstrates global hypokinesis although the septal segments appear slightly worse. The left ventricular internal cavity size was normal in size. There is no left ventricular hypertrophy. Left ventricular diastolic parameters are consistent with Grade II diastolic dysfunction (pseudonormalization). Right Ventricle: The right ventricular size is moderately enlarged. No increase in right ventricular wall thickness. Right ventricular systolic function is mildly reduced. There is moderately elevated pulmonary artery systolic pressure. The tricuspid regurgitant velocity is 3.47 m/s, and with an assumed right atrial pressure of 3 mmHg, the estimated right ventricular systolic pressure is 69.7 mmHg. Left Atrium: Left atrial size was normal in size. Right Atrium: Right atrial size was massively dilated. Pericardium: There is no evidence of  pericardial effusion. Mitral Valve: The mitral valve is grossly normal. There is mild thickening of the mitral valve leaflet(s). Mild mitral valve regurgitation. Tricuspid Valve: The tricuspid valve is normal in structure. Tricuspid valve regurgitation is moderate to severe. Aortic Valve: The aortic valve is tricuspid. There is mild calcification of the aortic valve. There is mild thickening of the aortic valve. Aortic valve regurgitation is mild. Aortic regurgitation PHT measures 643 msec. Pulmonic Valve: The pulmonic valve was normal in structure. Pulmonic valve regurgitation is trivial. Aorta: Aortic dilatation noted. There is mild dilatation of the ascending aorta, measuring 36 mm. Venous: The inferior vena cava is normal in size with greater than 50% respiratory variability, suggesting right atrial pressure of 3 mmHg. IAS/Shunts: There is left bowing of the interatrial septum, suggestive of elevated right atrial pressure. No atrial level shunt detected by color flow Doppler.  LEFT VENTRICLE PLAX 2D LVIDd:         5.10 cm  Diastology LVIDs:         3.70 cm  LV e' medial:    5.77 cm/s LV PW:         1.40 cm  LV E/e' medial:  11.5 LV IVS:        0.90 cm  LV e' lateral:   8.59 cm/s LVOT diam:     2.00 cm  LV E/e' lateral: 7.7 LV SV:         46 LV SV Index:   23 LVOT Area:     3.14 cm  RIGHT VENTRICLE             IVC RV Basal diam:  4.30 cm     IVC diam: 2.20 cm RV Mid diam:    3.30 cm RV S prime:     11.70 cm/s TAPSE (M-mode): 2.2 cm LEFT ATRIUM             Index       RIGHT ATRIUM           Index LA diam:        4.20 cm 2.11 cm/m  RA Area:     48.30 cm LA Vol (A2C):   70.9 ml 35.70 ml/m RA Volume:   222.00 ml 111.77 ml/m LA Vol (A4C):   39.6 ml 19.94 ml/m LA Biplane Vol: 55.1 ml 27.74 ml/m  AORTIC VALVE LVOT Vmax:   73.70 cm/s LVOT Vmean:  51.150 cm/s LVOT VTI:    0.145 m AI PHT:      643 msec  AORTA Ao Root diam: 3.10 cm Ao Asc diam:  3.60 cm MITRAL VALVE  TRICUSPID VALVE MV Area (PHT): 2.01  cm    TR Peak grad:   48.2 mmHg MV Decel Time: 377 msec    TR Vmax:        347.00 cm/s MV E velocity: 66.30 cm/s MV A velocity: 52.10 cm/s  SHUNTS MV E/A ratio:  1.27        Systemic VTI:  0.14 m                            Systemic Diam: 2.00 cm Gwyndolyn Kaufman MD Electronically signed by Gwyndolyn Kaufman MD Signature Date/Time: 03/19/2020/3:14:30 PM    Final         Scheduled Meds: . acyclovir  200 mg Oral BID  . amLODipine  10 mg Oral Daily  . Chlorhexidine Gluconate Cloth  6 each Topical Daily  . dexamethasone (DECADRON) injection  4 mg Intravenous Q24H  . furosemide  80 mg Intravenous BID  . loratadine  10 mg Oral Daily  . sodium chloride flush  10-40 mL Intracatheter Q12H   Continuous Infusions: . piperacillin-tazobactam (ZOSYN)  IV 3.375 g (03/19/20 2143)     LOS: 2 days     Cordelia Poche, MD [facility] 03/20/2020, 9:23 AM  If 7PM-7AM, please contact night-coverage [email or web address]

## 2020-03-20 NOTE — Plan of Care (Signed)
Patient remains on room air with oxygen saturation in the 90's.  Up to chair for most of shift, no c/o nausea, vomiting.  Patient did have small amount of loose stool 3 times with urination.  GI panel resulted as negative.  Met son in main lobby, explained no visitation for covid patients at this time.  This RN did deliver a bag of belongings to patient from son.

## 2020-03-20 NOTE — Consult Note (Addendum)
Cardiology Consultation:   Patient ID: Debbie Chan MRN: [id number]; DOB: 07-Nov-1950  Admit date: 03/18/2020 Date of Consult: 03/20/2020  Primary Care Provider: [name], MD [facility] Cardiologist: New to [place]; Dr. [facility] Electrophysiologist:  None    Patient Profile:   Debbie Chan is a 70 y.o. female with a paroxysmal atrial flutter and multiple myeloma on chemotherapy, who is being seen today for the evaluation of CHF at the request of Dr. Lonny Prude.  History of Present Illness:   Debbie Chan presented to the ED 03/18/20 with complaints of generalized weakness, LLQ abdominal pain, and loose stools. She is currently on chemotherapy for management of her multiple myeloma with last infusion 03/11/20 and 03/12/20. She has been receiving cycles of cyclophosphamide, carfilzomib, and dexamethasone for nearly a year now (currently on cycle #11). Generalize weakness was out of the ordinary following her infusions which prompted her to present to the ED for further evaluation.   On arrival to the ED she was found to have significant thrombocytopenia, for which case was discussed with oncology and she was recommended to start IV steroids and undergo platelet transfusion. She was started on IV antibiotics for possible colitis in an immunocompromised patient. Additionally she was found to have an AKI felt to be 2/2 dehydration. She was admitted to medicine. Subsequently found to be positive for COVID-19. She was noted to have cardiomegaly on CXR. Echocardiogram obtained revealing EF 25-30% (previously 55-60% 08/2017) with global hypokinesis, slightly worse at septal LV segments, G2DD, mildly reduced RV systolic function, moderately elevated PA systolic pressures, mild MR, moderate to severe TR, and mildly dilated ascending aorta (72m). 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   Patient reported a chronic cough when laying down to sleep at night for the past several  months though no significant SOB at rest or DOE. No complaints of chest pain or palpitations. She denied prior cardiac history and has never undergone ischemic testing in the past. She has not had any observable recurrent atrial flutter since her brief episode in 2019 for which she was started on amiodarone, though DOAC not initiated due to anticipated oncological work-up. She received a dose of IV lasix yesterday and reported sleeping better last night. She has continued to have a decline in her renal function this admission and nephrology is now on board with plans to trial bicarb gtt.    Past Medical History:  Diagnosis Date  . Cancer ([facility]     Past Surgical History:  Procedure Laterality Date  . IR IMAGING GUIDED PORT INSERTION  05/10/2019     Home Medications:  Prior to Admission medications   Medication Sig Start Date End Date Taking? Authorizing Provider  acyclovir (ZOVIRAX) 200 MG capsule Take 1 capsule (200 mg total) by mouth 2 (two) times daily. 12/01/19  Yes Heilingoetter, Cassandra L, PA-C  dexamethasone (DECADRON) 4 MG tablet Take 10 tablets (40 mg total) by mouth every 7 (seven) days. 01/05/20  Yes MCurt Bears MD  diphenhydrAMINE (BENADRYL ALLERGY) 25 MG tablet Take 1 tablet (25 mg total) by mouth every 6 (six) hours as needed for itching. 10/23/17  Yes Tanner, VLyndon Code, PA-C  furosemide (LASIX) 40 MG tablet Take 40 mg by mouth daily as needed. 05/24/19  Yes [provider]  loratadine (CLARITIN) 10 MG tablet Take 10 mg by mouth daily.   Yes [provider]  MELATONIN PO Take 10 mg by mouth at bedtime as needed (sleep).   Yes [provider]  metoprolol  tartrate (LOPRESSOR) 25 MG tablet TAKE 1/2 TABLET BY MOUTH TWICE DAILY Patient taking differently: 25 mg. 10/28/19  Yes Curt Bears, MD  omeprazole (PRILOSEC) 20 MG capsule Take 1 capsule (20 mg total) by mouth daily. 12/01/19  Yes Heilingoetter, Cassandra L, PA-C  blood glucose meter kit and  supplies Dispense based on patient and insurance preference. Use up to four times daily as directed. (FOR ICD-10 E10.9, E11.9). 09/28/17   Arrien, Jimmy Picket, MD  lidocaine-prilocaine (EMLA) cream Apply 1 application topically as needed. Patient not taking: Reported on 03/19/2020 01/05/20   Curt Bears, MD  oxyCODONE-acetaminophen (PERCOCET) 5-325 MG tablet Take 2 tablets by mouth every 6 (six) hours as needed. Patient not taking: No sig reported 02/17/19   Milton Ferguson, MD  prochlorperazine (COMPAZINE) 10 MG tablet Take 1 tablet (10 mg total) by mouth every 6 (six) hours as needed for nausea or vomiting. Patient not taking: No sig reported 01/05/20   Curt Bears, MD  RELION GLUCOSE TEST STRIPS test strip USE 1 TO CHECK GLUCOSE ONCE DAILY 03/06/18   [provider]  ReliOn Ultra Thin Lancets 30G MISC USE 1 TO CHECK GLUCOSE ONCE DAILY FOR GLUCOSE TESTING 03/06/18   [provider]    Inpatient Medications: Scheduled Meds: . acyclovir  200 mg Oral BID  . Chlorhexidine Gluconate Cloth  6 each Topical Daily  . dexamethasone (DECADRON) injection  4 mg Intravenous Q24H  . hydrALAZINE  25 mg Oral Q8H  . isosorbide mononitrate  30 mg Oral Daily  . loratadine  10 mg Oral Daily  . sodium chloride flush  10-40 mL Intracatheter Q12H   Continuous Infusions: . piperacillin-tazobactam (ZOSYN)  IV 3.375 g (03/20/20 1003)  .  sodium bicarbonate (isotonic) infusion in sterile water 75 mL/hr at 03/20/20 1055   PRN Meds: acetaminophen **OR** acetaminophen, hydrALAZINE, HYDROcodone-acetaminophen, melatonin, sodium chloride flush  Allergies:   No Known Allergies  Social History:   Social History   Socioeconomic History  . Marital status: Widowed    Spouse name: Not on file  . Number of children: 4  . Years of education: Not on file  . Highest education level: Not on file  Occupational History  . Not on file  Tobacco Use  . Smoking status: Never Smoker  . Smokeless  tobacco: Never Used  Vaping Use  . Vaping Use: Never used  Substance and Sexual Activity  . Alcohol use: Never  . Drug use: Never  . Sexual activity: Not on file  Other Topics Concern  . Not on file  Social History Narrative  . Not on file   Social Determinants of Health   Financial Resource Strain: Not on file  Food Insecurity: Not on file  Transportation Needs: Not on file  Physical Activity: Not on file  Stress: Not on file  Social Connections: Not on file  Intimate Partner Violence: Not on file    Family History:    Family History  Problem Relation Age of Onset  . Hypertension Other      ROS:  Please see the history of present illness.   All other ROS reviewed and negative.     Physical Exam/Data:   Vitals:   03/19/20 2308 03/20/20 0332 03/20/20 0626 03/20/20 1005  BP: (!) 141/82 (!) 151/82 (!) 145/92   Pulse: 70 77 75   Resp: _0 Temp: 98.2 F (36.8 C) (!) 97.5 F (36.4 C) (!) 97.2 F (36.2 C)   TempSrc: Oral Oral Oral  SpO2: 97% 94% 99%   Weight:    86.3 kg  Height:        Intake/Output Summary (Last 24 hours) at 03/20/2020 1301 Last data filed at 03/20/2020 0000 Gross per 24 hour  Intake -  Output 150 ml  Net -150 ml   Last 3 Weights 03/20/2020 03/19/2020 03/08/2020  Weight (lbs) 190 lb 4.8 oz 191 lb 12.8 oz 187 lb  Weight (kg) 86.32 kg 87 kg 84.823 kg     Body mass index is 29.81 kg/m.  Physical exam per MD below  EKG:  The EKG was personally reviewed and demonstrates:  Sinus rhythm with rate 76 bpm, non-specific T wave abnormalities, no STE/D  Relevant CV Studies: Echocardiogram 03/19/20: 1. Left ventricular ejection fraction, by estimation, is 25 to 30%. The  left ventricle has severely decreased function. The left ventricle  demonstrates global hypokinesis although the septal LV segments appear  slightly worse. Left ventricular diastolic  parameters are consistent with Grade II diastolic dysfunction  (pseudonormalization).   2.  Right ventricular systolic function is mildly reduced. The right  ventricular size is moderately enlarged. There is moderately elevated  pulmonary artery systolic pressure.   3. The mitral valve is grossly normal. Mild mitral valve regurgitation.   4. Right atrial size was massively dilated.   5. Tricuspid valve regurgitation is moderate to severe.   6. The aortic valve is tricuspid. There is mild calcification of the  aortic valve. There is mild thickening of the aortic valve. Aortic valve  regurgitation is mild.   7. Aortic dilatation noted. 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   8. The inferior vena cava is normal in size with greater than 50%  respiratory variability, suggesting right atrial pressure of 3 mmHg.   Comparison(s): Compared to prior study on 46/5681, the LV systolic  function is now severely reduced.   Laboratory Data:  High Sensitivity Troponin:  No results for input(s): TROPONINIHS in the last 720 hours.   Chemistry Recent Labs  Lab 03/19/20 1118 03/19/20 1544 03/20/20 0414  NA 130* 130* 130*  K 4.5 4.7 4.7  CL 96* 99 100  CO2 16* 17* 16*  GLUCOSE 165* 140* 127*  BUN 62* 60* 71*  CREATININE 3.87* 4.14* 4.43*  CALCIUM 8.1* 8.2* 8.4*  GFRNONAA 12* 11* 10*  ANIONGAP 18* 14 14    Recent Labs  Lab 03/14/20 0858 03/19/20 1118 03/20/20 0414  PROT 5.6* 5.0* 4.9*  ALBUMIN 3.5 3.1* 2.9*  AST _0 ALT _1 ALKPHOS 63 53 47  BILITOT 0.7 1.0 0.9   Hematology Recent Labs  Lab 03/19/20 0624 03/19/20 1118 03/20/20 0414  WBC 9.2 11.0* 12.9*  RBC 3.69* 3.36* 3.26*  HGB 11.9* 10.6* 10.4*  HCT 35.3* 31.9* 31.0*  MCV 95.7 94.9 95.1  MCH 32.2 31.5 31.9  MCHC 33.7 33.2 33.5  RDW 16.0* 15.7* 15.8*  PLT 44* 38* 35*   BNP Recent Labs  Lab 03/19/20 1715  BNP 1,809.1*    DDimer  Recent Labs  Lab 03/19/20 0100  DDIMER 1.36*     Radiology/Studies:  CT ABDOMEN PELVIS WO CONTRAST  Result Date:  03/18/2020 CLINICAL DATA:  70 year old female with left lower quadrant abdominal pain. Concern for diverticulitis. EXAM: CT ABDOMEN AND PELVIS WITHOUT CONTRAST TECHNIQUE: Multidetector CT imaging of the abdomen and pelvis was performed following the standard protocol without IV contrast. COMPARISON:  None. FINDINGS: Evaluation of this exam is limited  in the absence of intravenous contrast. Lower chest: Partially visualized small bilateral pleural effusions with bibasilar subsegmental atelectasis. There is cardiomegaly with dilatation of the right atrium. No intra-abdominal free air. Small ascites. Hepatobiliary: Multiple hepatic cysts and several additional subcentimeter hypodensities which are too small to characterize. No intrahepatic biliary dilatation. The gallbladder is unremarkable. Pancreas: Unremarkable. No pancreatic ductal dilatation or surrounding inflammatory changes. Spleen: Normal in size without focal abnormality. Adrenals/Urinary Tract: Polycystic morphology of the kidneys with replacement of the majority of the renal parenchyma by cysts. Evaluation of the renal cyst is limited on this noncontrast CT. There is no hydronephrosis or nephrolithiasis on either side. The urinary bladder is grossly unremarkable. Stomach/Bowel: There is sigmoid diverticulosis. There is diffuse inflammatory changes and thickening of the distal descending and sigmoid colon consistent with colitis. Correlation with clinical exam and stool cultures recommended. No bowel obstruction. The appendix is normal. Vascular/Lymphatic: Moderate aortoiliac atherosclerotic disease. The IVC is unremarkable. No portal venous gas. There is no adenopathy. Reproductive: The uterus and ovaries are grossly unremarkable. Other: Mild subcutaneous edema. No fluid collection. Musculoskeletal: Osteopenia. No acute osseous pathology. IMPRESSION: 1. Colitis of the distal descending and sigmoid colon. Correlation with clinical exam and stool cultures  recommended. No bowel obstruction. Normal appendix. 2. Sigmoid diverticulosis. 3. Polycystic morphology of the kidneys. 4. Small bilateral pleural effusions with bibasilar subsegmental atelectasis. 5. Aortic Atherosclerosis (ICD10-I70.0). Electronically Signed   By: Anner Crete M.D.   On: 03/18/2020 21:33   US RENAL  Result Date: 03/20/2020 CLINICAL DATA:  Chronic kidney disease, acute renal failure EXAM: RENAL / URINARY TRACT ULTRASOUND COMPLETE COMPARISON:  None. FINDINGS: Right Kidney: Renal measurements: 12.9 x 5.8 x 6.5 cm = volume: 257.6 mL. Echogenicity is likely increased. Numerous cysts are present. No hydronephrosis visualized. Left Kidney: Renal measurements: 17.2 x 7.8 x 8.2 cm = volume: 576.5 mL. Echogenicity is likely increased. Numerous cysts are present. No hydronephrosis visualized. Bladder: Not visualized due to recent voiding. Other: None. IMPRESSION: No hydronephrosis. Numerous bilateral renal cysts. Increased echogenicity likely reflecting chronic medical renal disease. Electronically Signed   By: Macy Mis M.D.   On: 03/20/2020 11:05   DG CHEST PORT 1 VIEW  Result Date: 03/19/2020 CLINICAL DATA:  70 year old female with weakness. EXAM: PORTABLE CHEST 1 VIEW COMPARISON:  Chest radiograph dated 03/19/2020. FINDINGS: Right sided Port-A-Cath in similar position. There is shallow inspiration. Probable small left and trace right pleural effusions. Minimal left lung base atelectasis. Pneumonia is not excluded. No pneumothorax. No acute osseous pathology. Osteopenia. IMPRESSION: Shallow inspiration with probable small left and trace right pleural effusions. No significant interval change since the prior radiograph. Electronically Signed   By: Anner Crete M.D.   On: 03/19/2020 16:58   DG CHEST PORT 1 VIEW  Result Date: 03/19/2020 CLINICAL DATA:  Multiple myeloma, weakness EXAM: PORTABLE CHEST 1 VIEW COMPARISON:  01/25/2020 FINDINGS: The lungs are symmetrically well expanded.  Tiny left pleural effusion is suspected. No pneumothorax. Right internal jugular chest port tip is seen within the superior vena cava, unchanged. Mild cardiomegaly is likely stable when accounting for changes in radiographic technique. Pulmonary vascularity is normal. No acute bone abnormality. IMPRESSION: Stable cardiomegaly.  Tiny left pleural effusion. Electronically Signed   By: Fidela Salisbury MD   On: 03/19/2020 00:55   ECHOCARDIOGRAM COMPLETE  Result Date: 03/19/2020    ECHOCARDIOGRAM REPORT   Patient Name:   Debbie Chan Date of Exam: 03/19/2020 Medical Rec #:  [id number]    Height:  68.0 in Accession #:    [id number]   Weight:       187.0 lb Date of Birth:  1950/03/21    BSA:          1.986 m Patient Age:    61 years     BP:           166/87 mmHg Patient Gender: F            HR:           68 bpm. Exam Location:  Inpatient Procedure: 2D Echo, Cardiac Doppler and Color Doppler Indications:    I51.7 Cardiomegaly  History:        Patient has prior history of Echocardiogram examinations, most                 recent 09/21/2017. Cancer.  Sonographer:    Jonelle Sidle Dance Referring Phys: [id number] [place]  1. Left ventricular ejection fraction, by estimation, is 25 to 30%. The left ventricle has severely decreased function. The left ventricle demonstrates global hypokinesis although the septal LV segments appear slightly worse. Left ventricular diastolic parameters are consistent with Grade II diastolic dysfunction (pseudonormalization).  2. Right ventricular systolic function is mildly reduced. The right ventricular size is moderately enlarged. There is moderately elevated pulmonary artery systolic pressure.  3. The mitral valve is grossly normal. Mild mitral valve regurgitation.  4. Right atrial size was massively dilated.  5. Tricuspid valve regurgitation is moderate to severe.  6. The aortic valve is tricuspid. There is mild calcification of the aortic valve. There is mild thickening of the  aortic valve. Aortic valve regurgitation is mild.  7. Aortic dilatation noted. There is mild dilatation of the ascending aorta, measuring 36 mm.  8. The inferior vena cava is normal in size with greater than 50% respiratory variability, suggesting right atrial pressure of 3 mmHg. Comparison(s): Compared to prior study on 63/7858, the LV systolic function is now severely reduced. FINDINGS  Left Ventricle: Left ventricular ejection fraction, by estimation, is 25 to 30%. The left ventricle has severely decreased function. The left ventricle demonstrates global hypokinesis although the septal segments appear slightly worse. The left ventricular internal cavity size was normal in size. There is no left ventricular hypertrophy. Left ventricular diastolic parameters are consistent with Grade II diastolic dysfunction (pseudonormalization). Right Ventricle: The right ventricular size is moderately enlarged. No increase in right ventricular wall thickness. Right ventricular systolic function is mildly reduced. There is moderately elevated pulmonary artery systolic pressure. The tricuspid regurgitant velocity is 3.47 m/s, and with an assumed right atrial pressure of 3 mmHg, the estimated right ventricular systolic pressure is 85.0 mmHg. Left Atrium: Left atrial size was normal in size. Right Atrium: Right atrial size was massively dilated. Pericardium: There is no evidence of pericardial effusion. Mitral Valve: The mitral valve is grossly normal. There is mild thickening of the mitral valve leaflet(s). Mild mitral valve regurgitation. Tricuspid Valve: The tricuspid valve is normal in structure. Tricuspid valve regurgitation is moderate to severe. Aortic Valve: The aortic valve is tricuspid. There is mild calcification of the aortic valve. There is mild thickening of the aortic valve. Aortic valve regurgitation is mild. Aortic regurgitation PHT measures 643 msec. Pulmonic Valve: The pulmonic valve was normal in structure.  Pulmonic valve regurgitation is trivial. Aorta: Aortic dilatation noted. There is mild dilatation of the ascending aorta, measuring 36 mm. Venous: The inferior vena cava is normal in size with greater than 50% respiratory variability, suggesting right  atrial pressure of 3 mmHg. IAS/Shunts: There is left bowing of the interatrial septum, suggestive of elevated right atrial pressure. No atrial level shunt detected by color flow Doppler.  LEFT VENTRICLE PLAX 2D LVIDd:         5.10 cm  Diastology LVIDs:         3.70 cm  LV e' medial:    5.77 cm/s LV PW:         1.40 cm  LV E/e' medial:  11.5 LV IVS:        0.90 cm  LV e' lateral:   8.59 cm/s LVOT diam:     2.00 cm  LV E/e' lateral: 7.7 LV SV:         46 LV SV Index:   23 LVOT Area:     3.14 cm  RIGHT VENTRICLE             IVC RV Basal diam:  4.30 cm     IVC diam: 2.20 cm RV Mid diam:    3.30 cm RV S prime:     11.70 cm/s TAPSE (M-mode): 2.2 cm LEFT ATRIUM             Index       RIGHT ATRIUM           Index LA diam:        4.20 cm 2.11 cm/m  RA Area:     48.30 cm LA Vol (A2C):   70.9 ml 35.70 ml/m RA Volume:   222.00 ml 111.77 ml/m LA Vol (A4C):   39.6 ml 19.94 ml/m LA Biplane Vol: 55.1 ml 27.74 ml/m  AORTIC VALVE LVOT Vmax:   73.70 cm/s LVOT Vmean:  51.150 cm/s LVOT VTI:    0.145 m AI PHT:      643 msec  AORTA Ao Root diam: 3.10 cm Ao Asc diam:  3.60 cm MITRAL VALVE               TRICUSPID VALVE MV Area (PHT): 2.01 cm    TR Peak grad:   48.2 mmHg MV Decel Time: 377 msec    TR Vmax:        347.00 cm/s MV E velocity: 66.30 cm/s MV A velocity: 52.10 cm/s  SHUNTS MV E/A ratio:  1.27        Systemic VTI:  0.14 m                            Systemic Diam: 2.00 cm Gwyndolyn Kaufman MD Electronically signed by Gwyndolyn Kaufman MD Signature Date/Time: 03/19/2020/3:14:30 PM    Final      Assessment and Plan:   1. Acute combined CHF: patient presented with generalize weakness, abdominal pain, and diarrhea. Noted to have cardiomegaly on CXR. Echo obtained revealing EF  25-30%, global hypokinesis appearing slightly worse in the septal LV segments, G2DD, mildly reduced RV systolic function, moderately elevated PA pressures, mild MR, moderate-sever TR, and mild dilation of the ascending aorta; previous echo 08/2017 showed EF 55-60%. Etiology remains unclear. No symptoms to suggest ischemic etiology. TSH is wnl. BP has been generally elevated which could have contributed. 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 She appears euvolemic on exam today, though nephrology concerned patient may be dry and trialing bicarb gtt given uptrending Cr. AKI limiting GDMT at this time - Will stop amlodipine - Start hydralazine and imdur - uptitrate as tolerated -  Ancipitate starting Bblocker therapy prior to discharge.  - Will need to discuss alternative treatment options for her MM with her oncologist given suspicions for chemotherapy induced cardiomyopathy - Continue to monitor volume status closely with strict I&Os and daily weights - Continue to monitor electrolytes closely and replete as needed to maintain K >4, Mg >2  2. Paroxysmal atrial flutter: brief episode occurred in 2019. Briefly managed with amiodarone at that time. No clear reoccurrence since then. She is not on anticoagulation. At this point her CHA2DS2-VASc Score = 4 [CHF History: Yes, HTN History: Yes, Diabetes History: No, Stroke History: No, Vascular Disease History: No, Age Score: 1, Gender Score: 1].  Therefore, the patient's annual risk of stroke is 4.8 %.  More recently she has struggled with thrombocytopenia.  - Favor holding off on anticoagulation at this time - Likely will start BBlocker as above prior to discharge  3. HTN: BP generally elevated this admission - Anticipate management in the context of #1  4. COVID-19: largely asymptomatic. Noted to be positive on admission screening. Satting well on RA without SOB - Continue management  per primary team  5. AKI: Cr 3.31 on admission, up to 4.4 today. Baseline appears to be 1.2. Nephrology following with plans to trial bicarb gtt  - Continue close monitoring and management per primary team.  6. Anemia/thrombocytopenia: Hgb appears stable, however PLT 8 on admission, currently 35 following PLT transfusion. Goal PLT >20 per oncology - Continue management per primary team  7. Multiple myeloma: has been undergoing cycles of cytoxan, carfilzomib, and dexamethasone for nearly a year (currently on cycle #11). Suspect carfilzomib may be contributing to #1 - Dr. Sallyanne Kuster to message Dr. Julien Nordmann for consideration of alternative treatment options in light of new cardiomyopathy.          Risk Assessment/Risk Scores:        New York Heart Association (NYHA) Functional Class NYHA Class II  CHA2DS2-VASc Score = 4  This indicates a 4.8% annual risk of stroke. The patient's score is based upon: CHF History: Yes HTN History: Yes Diabetes History: No Stroke History: No Vascular Disease History: No Age Score: 1 Gender Score: 1          For questions or updates, please contact [place] Please consult [email or web address] for contact info under    Signed, Abigail Butts, PA-C  03/20/2020 1:01 PM  I have seen and examined the patient along with Abigail Butts, PA-C .  I have reviewed the chart, notes and new data.  I agree with PA/NP's note.  Key new complaints: She has had a nagging cough that prevented sleep since October. It improved after she received furosemide and she slept well last night. No angina at rest or w activity. No palpitations or syncope. Key examination changes: displaced apical impulse, RRR, 4-1/3 holosystolic murmur at LLSB, prominent V waves in JVP, which is only borderline elevated at tip of clavicle, no diastolic murmurs, clear lungs and no edema. Key new findings / data: echo reviewed and compared to 2019. The right heart abnormalities (dilated  RA and RV with wide-open TR due to leaflet malcoaptation) are chronic, but LV dysfunction is new. ECG shows NSR and no ischemic features. Severe acute on chronic renal dysfunction, GFR around 10, metabolic acidosis.  PLAN: 1. Severe LV systolic dysfunction with mild signs of elevated left heart pressure (acute systolic HF). Etiology uncertain, but likely nonischemic. Symptoms precede her acute illness by weeks, so this is not COVID related. I  am concerned that this may be toxic cardiomyopathy due to cyclofosfamide, or more likely due to carfilzomib which can cause severe cardiac side-effects, particularly CHF in 8-18% of recipients (particularly at doses > 45 mg/m sq BSA).  It does not appear that we can incriminate tachycardia CMP, she is in normal rhythm. Recommend stopping the carfilzomib and asking her Oncologist if there is an alternative treatment for her myeloma. Unfortunately, history of progression on Revlimid, allergy to Pomalyst. She is not a candidate for coronary angio, but we may have to consider right heart catheterization since she also has renal failure and RA pressure cannot be used to guide volume management (severe TR). I do not think she is "dry". Iam concerned that she will have worsening left heart failure during administration of IV fluids for her renal dysfunction. 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 2. Right heart failure and severe TR Present in 2019. Etiology uncertain, but TR may have been the primary problem. Consider TEE at some point in the future. It is also the substrate for her previous atrial flutter (thankfully not currently an issue).  There is mild-moderate elevation is PA pressure, not likely to be the cause for such severe TR.    Sanda Klein, MD, [place] [phone] 03/20/2020, 1:16 PM

## 2020-03-21 ENCOUNTER — Inpatient Hospital Stay (HOSPITAL_COMMUNITY): Payer: Self-pay

## 2020-03-21 DIAGNOSIS — D696 Thrombocytopenia, unspecified: Secondary | ICD-10-CM

## 2020-03-21 DIAGNOSIS — I483 Typical atrial flutter: Secondary | ICD-10-CM

## 2020-03-21 DIAGNOSIS — C9000 Multiple myeloma not having achieved remission: Secondary | ICD-10-CM

## 2020-03-21 DIAGNOSIS — I10 Essential (primary) hypertension: Secondary | ICD-10-CM

## 2020-03-21 LAB — CBC
HCT: 28.8 % — ABNORMAL LOW (ref 36.0–46.0)
Hemoglobin: 9.9 g/dL — ABNORMAL LOW (ref 12.0–15.0)
MCH: 32.2 pg (ref 26.0–34.0)
MCHC: 34.4 g/dL (ref 30.0–36.0)
MCV: 93.8 fL (ref 80.0–100.0)
Platelets: 35 10*3/uL — ABNORMAL LOW (ref 150–400)
RBC: 3.07 MIL/uL — ABNORMAL LOW (ref 3.87–5.11)
RDW: 15.9 % — ABNORMAL HIGH (ref 11.5–15.5)
WBC: 9 10*3/uL (ref 4.0–10.5)
nRBC: 0.3 % — ABNORMAL HIGH (ref 0.0–0.2)

## 2020-03-21 LAB — BRAIN NATRIURETIC PEPTIDE: B Natriuretic Peptide: 594.1 pg/mL — ABNORMAL HIGH (ref 0.0–100.0)

## 2020-03-21 LAB — BASIC METABOLIC PANEL
Anion gap: 11 (ref 5–15)
BUN: 76 mg/dL — ABNORMAL HIGH (ref 8–23)
CO2: 25 mmol/L (ref 22–32)
Calcium: 8.1 mg/dL — ABNORMAL LOW (ref 8.9–10.3)
Chloride: 95 mmol/L — ABNORMAL LOW (ref 98–111)
Creatinine, Ser: 4.42 mg/dL — ABNORMAL HIGH (ref 0.44–1.00)
GFR, Estimated: 10 mL/min — ABNORMAL LOW (ref >60)
Glucose, Bld: 100 mg/dL — ABNORMAL HIGH (ref 70–99)
Potassium: 4 mmol/L (ref 3.5–5.1)
Sodium: 131 mmol/L — ABNORMAL LOW (ref 135–145)

## 2020-03-21 MED ORDER — ISOSORBIDE MONONITRATE ER 30 MG PO TB24
30.0000 mg | ORAL_TABLET | Freq: Two times a day (BID) | ORAL | Status: DC
  Administered 2020-03-21 – 2020-04-03 (×26): 30 mg via ORAL
  Filled 2020-03-21 (×26): qty 1

## 2020-03-21 MED ORDER — HYDRALAZINE HCL 25 MG PO TABS
37.5000 mg | ORAL_TABLET | Freq: Three times a day (TID) | ORAL | Status: DC
  Administered 2020-03-21 – 2020-03-23 (×6): 37.5 mg via ORAL
  Filled 2020-03-21 (×6): qty 2

## 2020-03-21 MED ORDER — AMOXICILLIN-POT CLAVULANATE 500-125 MG PO TABS
1.0000 | ORAL_TABLET | Freq: Two times a day (BID) | ORAL | Status: DC
  Administered 2020-03-21 – 2020-03-23 (×5): 500 mg via ORAL
  Filled 2020-03-21 (×6): qty 1

## 2020-03-21 NOTE — Plan of Care (Signed)
  Problem: Education: Goal: Knowledge of risk factors and measures for prevention of condition will improve Outcome: Progressing   Problem: Coping: Goal: Psychosocial and spiritual needs will be supported Outcome: Progressing   Problem: Respiratory: Goal: Will maintain a patent airway Outcome: Progressing Goal: Complications related to the disease process, condition or treatment will be avoided or minimized Outcome: Progressing   

## 2020-03-21 NOTE — Plan of Care (Signed)
°  Problem: Education: Goal: Knowledge of risk factors and measures for prevention of condition will improve 03/21/2020 2327 by Darrick Huntsman, RN Outcome: Progressing 03/21/2020 2326 by Darrick Huntsman, RN Outcome: Progressing   Problem: Coping: Goal: Psychosocial and spiritual needs will be supported 03/21/2020 2327 by Darrick Huntsman, RN Outcome: Progressing 03/21/2020 2326 by Darrick Huntsman, RN Outcome: Progressing   Problem: Respiratory: Goal: Will maintain a patent airway 03/21/2020 2327 by Darrick Huntsman, RN Outcome: Progressing 03/21/2020 2326 by Darrick Huntsman, RN Outcome: Progressing Goal: Complications related to the disease process, condition or treatment will be avoided or minimized 03/21/2020 2327 by Darrick Huntsman, RN Outcome: Progressing 03/21/2020 2326 by Darrick Huntsman, RN Outcome: Progressing

## 2020-03-21 NOTE — Progress Notes (Addendum)
[facility] Progress Note  Subjective: UOP 800 cc yest and 1000 cc today, creat stable at 4.4 today.   Vitals:   03/20/20 2052 03/21/20 0448 03/21/20 0500 03/21/20 1354  BP: (!) 150/96 (!) 161/90  (!) 150/83  Pulse: 68 70  72  Resp: 20 20  (!) 25  Temp: 97.8 F (36.6 C) 97.6 F (36.4 C)  (!) 97.3 F (36.3 C)  TempSrc: Oral Oral  Oral  SpO2: 99% 98%  100%  Weight:   87.4 kg   Height:        Exam: alert, nad   no jvd  Chest cta bilat  Cor reg no RG  Abd soft ntnd no ascites   Ext 1+ pretib  edema   Alert, NF, ox3, no asterixis     Home meds:  - lasix 40 qd/ metoprolol 25 bid  - prilosec 20/ decadron 33m q 7d/ zovirax 200 bid  - chemoRx = Cytoxan, decadron, carfilzomib     UA 11-20 rbc, 0-5 wbc, rare bact, > 300 prot    UNa < 10, UCr 143    Renal [place]/25 -  IMPRESSION: No hydronephrosis. Numerous bilateral renal cysts. Increased echogenicity likely reflecting chronic medical renal disease.      ECHO 1/24 - LVEF 25-30%      CXR - IMPRESSION: Shallow inspiration with probable small left and trace right pleural effusions. No significant interval change since the prior Radiograph       CT abd - Adrenals/Urinary Tract: Polycystic morphology of the kidneys with replacement of the majority of the renal parenchyma by cysts. Evaluation of the renal cyst is limited on this noncontrast CT. There is no hydronephrosis or nephrolithiasis on either side. The urinary bladder is grossly unremarkable.         Alb 2.9  Ca 8.4 corr Ca 9.2   Tbili 1.0  Phos 4.8   WBC 11K plt 38k  Hb 10.6                      Date                Creat               eGFR         2019- 01/2020     1.15- 1.45        32- 50 ml/min          Jan 7                   1.17                 51, IIIa         Mar 14, 2020       1.24                 47, IIIa         Jan 23                 3.31                 15         Jan 24                 3.52         Jan 25                 4.43  10  Assessment/ Plan: 1. AKI on CKD 3b - b/l creat 1.1- 1.4 in Dec 2021, eGFR 32- 50. Creat 3.3 on admit 1/23 and up to 4.43 today.  UA w/ rbc's and proteinuria. UPC ratio is 1.8. Renal US w/o signs of obstruction. UA w/ low Na c/w vol depletion and/or syst CHF. Haptoglobin is low. Possible causes of AKI including dehydration, myeloma kidney, TMA d/t chemo, heart failure. TLS less likely. Hem- Onc is following. Will cont IVF's, lower to 65 cc/hr. Will follow.  2. COVID infection - asymptomatic, per pmd 3. Acute syst CHF - LVEF 25-30%, no vol overload on exam 4. Multiple myeloma - dx'd in 2019, w/o remission - per ONC, on cytoxan/ carfilzomib/ steroids 5. Thrombocytopenia - acute on chronic, per Hem-onc.  6. Prognosis - guarded  7. Anemia - Hb 10's 8. HTN - getting norvasc, BP's slightly up      Debbie Chan 03/21/2020, 1:57 PM   Recent Labs  Lab 03/19/20 0624 03/19/20 1118 03/19/20 1544 03/20/20 0414 03/21/20 0500  K  --  4.5   < > 4.7 4.0  BUN  --  62*   < > 71* 76*  CREATININE  --  3.87*   < > 4.43* 4.42*  CALCIUM  --  8.1*   < > 8.4* 8.1*  PHOS 4.2 4.8*  --   --   --   HGB 11.9* 10.6*  --  10.4* 9.9*   < > = values in this interval not displayed.   Inpatient medications: . acyclovir  200 mg Oral BID  . Chlorhexidine Gluconate Cloth  6 each Topical Daily  . dexamethasone (DECADRON) injection  4 mg Intravenous Q24H  . hydrALAZINE  37.5 mg Oral Q8H  . isosorbide mononitrate  30 mg Oral BID  . loratadine  10 mg Oral Daily  . sodium chloride flush  10-40 mL Intracatheter Q12H   . piperacillin-tazobactam (ZOSYN)  IV 3.375 g (03/21/20 0953)  .  sodium bicarbonate (isotonic) infusion in sterile water 75 mL/hr at 03/21/20 0115   acetaminophen **OR** acetaminophen, hydrALAZINE, HYDROcodone-acetaminophen, melatonin, sodium chloride flush

## 2020-03-21 NOTE — Progress Notes (Signed)
Progress Note  Patient Name: Debbie Chan Date of Encounter: 03/21/2020  [facility] Cardiologist: No primary care provider on file. New  Subjective   Denies overt dyspnea, but did have problems with coughing again last night.  No palpitations, no chest discomfort.  Diastolic blood pressure ranging in the 90s.  Inpatient Medications    Scheduled Meds: . acyclovir  200 mg Oral BID  . Chlorhexidine Gluconate Cloth  6 each Topical Daily  . dexamethasone (DECADRON) injection  4 mg Intravenous Q24H  . hydrALAZINE  25 mg Oral Q8H  . isosorbide mononitrate  30 mg Oral Daily  . loratadine  10 mg Oral Daily  . sodium chloride flush  10-40 mL Intracatheter Q12H   Continuous Infusions: . piperacillin-tazobactam (ZOSYN)  IV 3.375 g (03/21/20 0953)  .  sodium bicarbonate (isotonic) infusion in sterile water 75 mL/hr at 03/21/20 0115   PRN Meds: acetaminophen **OR** acetaminophen, hydrALAZINE, HYDROcodone-acetaminophen, melatonin, sodium chloride flush   Vital Signs    Vitals:   03/20/20 1425 03/20/20 2052 03/21/20 0448 03/21/20 0500  BP: (!) 148/93 (!) 150/96 (!) 161/90   Pulse: 78 68 70   Resp: (!) _0 Temp: 98.2 F (36.8 C) 97.8 F (36.6 C) 97.6 F (36.4 C)   TempSrc: Oral Oral Oral   SpO2: 100% 99% 98%   Weight:    87.4 kg  Height:        Intake/Output Summary (Last 24 hours) at 03/21/2020 1140 Last data filed at 03/21/2020 0645 Gross per 24 hour  Intake 1221.38 ml  Output 1650 ml  Net -428.62 ml   Last 3 Weights 03/21/2020 03/20/2020 03/19/2020  Weight (lbs) 192 lb 9.6 oz 190 lb 4.8 oz 191 lb 12.8 oz  Weight (kg) 87.363 kg 86.32 kg 87 kg      Telemetry    Normal sinus rhythm- Personally Reviewed  ECG    No new tracing- Personally Reviewed  Physical Exam  Sitting upright in chair, appears comfortable GEN: No acute distress.   Neck: No sustained jugular venous distention, but prominent V waves Cardiac: RRR, systolic murmur heard at left lower sternal  border, no apical holosystolic murmur is heard, no diastolic murmurs, rubs, or gallops.  Respiratory: Clear to auscultation bilaterally. GI: Soft, nontender, non-distended  MS: No edema; No deformity. Neuro:  Nonfocal  Psych: Normal affect   Labs    High Sensitivity Troponin:  No results for input(s): TROPONINIHS in the last 720 hours.    Chemistry Recent Labs  Lab 03/19/20 1118 03/19/20 1544 03/20/20 0414 03/21/20 0500  NA 130* 130* 130* 131*  K 4.5 4.7 4.7 4.0  CL 96* 99 100 95*  CO2 16* 17* 16* 25  GLUCOSE 165* 140* 127* 100*  BUN 62* 60* 71* 76*  CREATININE 3.87* 4.14* 4.43* 4.42*  CALCIUM 8.1* 8.2* 8.4* 8.1*  PROT 5.0*  --  4.9*  --   ALBUMIN 3.1*  --  2.9*  --   AST 21  --  17  --   ALT 12  --  11  --   ALKPHOS 53  --  47  --   BILITOT 1.0  --  0.9  --   GFRNONAA 12* 11* 10* 10*  ANIONGAP 18* _1 Hematology Recent Labs  Lab 03/19/20 1118 03/20/20 0414 03/21/20 0500  WBC 11.0* 12.9* 9.0  RBC 3.36* 3.26* 3.07*  HGB 10.6* 10.4* 9.9*  HCT 31.9* 31.0* 28.8*  MCV 94.9 95.1  93.8  MCH 31.5 31.9 32.2  MCHC 33.2 33.5 34.4  RDW 15.7* 15.8* 15.9*  PLT 38* 35* 35*    BNP Recent Labs  Lab 03/19/20 1715  BNP 1,809.1*     DDimer  Recent Labs  Lab 03/19/20 0100  DDIMER 1.36*     Radiology    US RENAL  Result Date: 03/20/2020 CLINICAL DATA:  Chronic kidney disease, acute renal failure EXAM: RENAL / URINARY TRACT ULTRASOUND COMPLETE COMPARISON:  None. FINDINGS: Right Kidney: Renal measurements: 12.9 x 5.8 x 6.5 cm = volume: 257.6 mL. Echogenicity is likely increased. Numerous cysts are present. No hydronephrosis visualized. Left Kidney: Renal measurements: 17.2 x 7.8 x 8.2 cm = volume: 576.5 mL. Echogenicity is likely increased. Numerous cysts are present. No hydronephrosis visualized. Bladder: Not visualized due to recent voiding. Other: None. IMPRESSION: No hydronephrosis. Numerous bilateral renal cysts. Increased echogenicity likely reflecting  chronic medical renal disease. Electronically Signed   By: Macy Mis M.D.   On: 03/20/2020 11:05   DG CHEST PORT 1 VIEW  Result Date: 03/19/2020 CLINICAL DATA:  70 year old female with weakness. EXAM: PORTABLE CHEST 1 VIEW COMPARISON:  Chest radiograph dated 03/19/2020. FINDINGS: Right sided Port-A-Cath in similar position. There is shallow inspiration. Probable small left and trace right pleural effusions. Minimal left lung base atelectasis. Pneumonia is not excluded. No pneumothorax. No acute osseous pathology. Osteopenia. IMPRESSION: Shallow inspiration with probable small left and trace right pleural effusions. No significant interval change since the prior radiograph. Electronically Signed   By: Anner Crete M.D.   On: 03/19/2020 16:58   ECHOCARDIOGRAM COMPLETE  Result Date: 03/19/2020    ECHOCARDIOGRAM REPORT   Patient Name:   Debbie Chan Date of Exam: 03/19/2020 Medical Rec #:  [id number]    Height:       68.0 in Accession #:    [id number]   Weight:       187.0 lb Date of Birth:  Feb 03, 1951    BSA:          1.986 m Patient Age:    70 years     BP:           166/87 mmHg Patient Gender: F            HR:           68 bpm. Exam Location:  Inpatient Procedure: 2D Echo, Cardiac Doppler and Color Doppler Indications:    I51.7 Cardiomegaly  History:        Patient has prior history of Echocardiogram examinations, most                 recent 09/21/2017. Cancer.  Sonographer:    Jonelle Sidle Dance Referring Phys: [id number] [place]  1. Left ventricular ejection fraction, by estimation, is 25 to 30%. The left ventricle has severely decreased function. The left ventricle demonstrates global hypokinesis although the septal LV segments appear slightly worse. Left ventricular diastolic parameters are consistent with Grade II diastolic dysfunction (pseudonormalization).  2. Right ventricular systolic function is mildly reduced. The right ventricular size is moderately enlarged. There is moderately  elevated pulmonary artery systolic pressure.  3. The mitral valve is grossly normal. Mild mitral valve regurgitation.  4. Right atrial size was massively dilated.  5. Tricuspid valve regurgitation is moderate to severe.  6. The aortic valve is tricuspid. There is mild calcification of the aortic valve. There is mild thickening of the aortic valve. Aortic valve regurgitation is mild.  7. Aortic dilatation noted. There  is mild dilatation of the ascending aorta, measuring 36 mm.  8. The inferior vena cava is normal in size with greater than 50% respiratory variability, suggesting right atrial pressure of 3 mmHg. Comparison(s): Compared to prior study on 11/2723, the LV systolic function is now severely reduced. FINDINGS  Left Ventricle: Left ventricular ejection fraction, by estimation, is 25 to 30%. The left ventricle has severely decreased function. The left ventricle demonstrates global hypokinesis although the septal segments appear slightly worse. The left ventricular internal cavity size was normal in size. There is no left ventricular hypertrophy. Left ventricular diastolic parameters are consistent with Grade II diastolic dysfunction (pseudonormalization). Right Ventricle: The right ventricular size is moderately enlarged. No increase in right ventricular wall thickness. Right ventricular systolic function is mildly reduced. There is moderately elevated pulmonary artery systolic pressure. The tricuspid regurgitant velocity is 3.47 m/s, and with an assumed right atrial pressure of 3 mmHg, the estimated right ventricular systolic pressure is 36.6 mmHg. Left Atrium: Left atrial size was normal in size. Right Atrium: Right atrial size was massively dilated. Pericardium: There is no evidence of pericardial effusion. Mitral Valve: The mitral valve is grossly normal. There is mild thickening of the mitral valve leaflet(s). Mild mitral valve regurgitation. Tricuspid Valve: The tricuspid valve is normal in structure.  Tricuspid valve regurgitation is moderate to severe. Aortic Valve: The aortic valve is tricuspid. There is mild calcification of the aortic valve. There is mild thickening of the aortic valve. Aortic valve regurgitation is mild. Aortic regurgitation PHT measures 643 msec. Pulmonic Valve: The pulmonic valve was normal in structure. Pulmonic valve regurgitation is trivial. Aorta: Aortic dilatation noted. There is mild dilatation of the ascending aorta, measuring 36 mm. Venous: The inferior vena cava is normal in size with greater than 50% respiratory variability, suggesting right atrial pressure of 3 mmHg. IAS/Shunts: There is left bowing of the interatrial septum, suggestive of elevated right atrial pressure. No atrial level shunt detected by color flow Doppler.  LEFT VENTRICLE PLAX 2D LVIDd:         5.10 cm  Diastology LVIDs:         3.70 cm  LV e' medial:    5.77 cm/s LV PW:         1.40 cm  LV E/e' medial:  11.5 LV IVS:        0.90 cm  LV e' lateral:   8.59 cm/s LVOT diam:     2.00 cm  LV E/e' lateral: 7.7 LV SV:         46 LV SV Index:   23 LVOT Area:     3.14 cm  RIGHT VENTRICLE             IVC RV Basal diam:  4.30 cm     IVC diam: 2.20 cm RV Mid diam:    3.30 cm RV S prime:     11.70 cm/s TAPSE (M-mode): 2.2 cm LEFT ATRIUM             Index       RIGHT ATRIUM           Index LA diam:        4.20 cm 2.11 cm/m  RA Area:     48.30 cm LA Vol (A2C):   70.9 ml 35.70 ml/m RA Volume:   222.00 ml 111.77 ml/m LA Vol (A4C):   39.6 ml 19.94 ml/m LA Biplane Vol: 55.1 ml 27.74 ml/m  AORTIC VALVE LVOT Vmax:   73.70  cm/s LVOT Vmean:  51.150 cm/s LVOT VTI:    0.145 m AI PHT:      643 msec  AORTA Ao Root diam: 3.10 cm Ao Asc diam:  3.60 cm MITRAL VALVE               TRICUSPID VALVE MV Area (PHT): 2.01 cm    TR Peak grad:   48.2 mmHg MV Decel Time: 377 msec    TR Vmax:        347.00 cm/s MV E velocity: 66.30 cm/s MV A velocity: 52.10 cm/s  SHUNTS MV E/A ratio:  1.27        Systemic VTI:  0.14 m                             Systemic Diam: 2.00 cm Gwyndolyn Kaufman MD Electronically signed by Gwyndolyn Kaufman MD Signature Date/Time: 03/19/2020/3:14:30 PM    Final     Cardiac Studies   Echocardiogram 03/19/20: 1. Left ventricular ejection fraction, by estimation, is 25 to 30%. The  left ventricle has severely decreased function. The left ventricle  demonstrates global hypokinesis although the septal LV segments appear  slightly worse. Left ventricular diastolic  parameters are consistent with Grade II diastolic dysfunction  (pseudonormalization).  2. Right ventricular systolic function is mildly reduced. The right  ventricular size is moderately enlarged. There is moderately elevated  pulmonary artery systolic pressure.  3. The mitral valve is grossly normal. Mild mitral valve regurgitation.  4. Right atrial size was massively dilated.  5. Tricuspid valve regurgitation is moderate to severe.  6. The aortic valve is tricuspid. There is mild calcification of the  aortic valve. There is mild thickening of the aortic valve. Aortic valve  regurgitation is mild.  7. Aortic dilatation noted. 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  8. The inferior vena cava is normal in size with greater than 50%  respiratory variability, suggesting right atrial pressure of 3 mmHg.   Comparison(s): Compared to prior study on 56/4332, the LV systolic  function is now severely reduced.    Patient Profile     70 y.o. female with multiple myeloma not in remission, status post 11 cycles of carfilzomib and cyclophosphamide, history of paroxysmal atrial flutter 2019, history of severe tricuspid regurgitation and right heart enlargement, admitted for suspected infectious colitis, acute renal insufficiency and severe thrombocytopenia and found to have severely depressed left ventricular systolic function, new compared to 2019.  Assessment & Plan    1. CHF: I am concerned that her cough is an  equivalent of orthopnea and is a sign of increasing left heart filling pressures.  No evidence of improvement in renal function with administration of fluids so far, although the urinary sodium level was low on admission.  Significant sodium load from Zosyn.  BNP was markedly elevated at 1809 on admission 01/24, substantially higher than the level in 2019.  Repeat BNP and chest x-ray. Cause of cardiomyopathy is unclear, but the leading suspect is cardiotoxic effect from carfilzomib chemotherapy. 2.  Chronic right heart failure: Severe tricuspid regurgitation, preserved right ventricular systolic function, massively dilated right atrium present since 2019 suggest chronic etiology, mechanism unclear.  At this point without overt findings of increased right heart filling pressures, no edema. 3.  History of paroxysmal atrial flutter: Thankfully, not a problem to date on this admission.  Anticoagulation currently on hold due to markedly worsened renal function.  Intermediate to high embolic risk with a [id number]-TYVD score of 4. 4. HTN: We will try to use predominantly hydralazine/nitrates in view of abnormal renal function.  Avoid RAAS inhibitors.  I am also concerned that she is at risk for decompensation and will avoid beta-blockers at this time, although ideally these will be started before discharge. 5.  IgA MM: Unfortunately, I am concerned that the current chemotherapy is causing her cardiac decompensation.  She has failed Velcade and had allergic response to Pomylast.  We will have to discuss alternatives with her oncologist. 6. Ac on CKD: Not improving so far with intravenous fluids.  Kidney ultrasound suggest chronic medical disease with cortical thinning.  However, the worsening of her renal function has been relatively abrupt for 1.2 as recently as January 19.  It is conceivable that she has acute tubular necrosis from hypovolemia from the colitis.  Note markedly increased protein creatinine ratio  suggesting possibility of myeloma kidney.  Despite the fact that she is anemic and thrombocytopenic, TTP appears unlikely (no schistocytes on peripheral smear, haptoglobin pending) and it is more likely her hematological abnormalities are related to chemotherapy and myeloma. 7. COVID-19: Probably the cause of her gastroenteritis.  Thankfully so far without any respiratory manifestations.  Her cough precedes the current hospitalization and Covid infection by several weeks and might have been a sign of fluid overload/heart failure.     For questions or updates, please contact [place] Please consult [email or web address] for contact info under        Signed, Sanda Klein, MD  03/21/2020, 11:40 AM

## 2020-03-21 NOTE — Progress Notes (Signed)
PROGRESS NOTE    Debbie Chan  NFA:[id number] DOB: Dec 14, 1950 DOA: 03/18/2020 PCP: [name], MD     Brief Narrative:  Debbie Chan is a 70 year old female with past medical history significant for multiple myeloma, atrial flutter, chronic anemia who presented to the hospital with complaints of weakness.  She was found to have severe thrombocytopenia and AKI.  She was given IV fluid, platelet transfusions.  She was also incidentally found to have COVID-19.  New events last 24 hours / Subjective: Patient without acute complaints.  Remains on room air.  Denies any pain or shortness of breath.  Assessment & Plan:   Active Problems:   Anemia   AKI (acute kidney injury) ([facility])   Atrial flutter (HCC)   Multiple myeloma without remission (HCC)   Thrombocytopenia (HCC)   Colitis   Hematuria   Polycystic kidney   Hyponatremia   Dehydration   AKI on CKD stage IIIb -Baseline creatinine 1.2 -Renal ultrasound without sign of obstruction -Could be secondary to dehydration, myeloma kidney, heart failure -Nephrology following -Continue sodium bicarb fluids  Acute systolic heart failure -BNP 1809.1 --> 594.1 -Suspect cardiomyopathy could be secondary to cardiotoxic effects from carfilzomib  -Cardiology following  COVID-19 -Incidental finding.  On room air.  CRP 0.9  Thrombocytopenia -Presented with platelet 8000 on admission.  Transfused 1 unit platelets -Hematology/oncology following.  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  Transfuse for platelets <20,000 for acute bleeding -Follow CBC  Acute colitis -GI PCR negative -CT abdomen pelvis reveals colitis of the distal descending and sigmoid colon -Zosyn --> Augmentin  Multiple myeloma -Follows with Dr. MJulien Chan-Continue Decadron  Paroxysmal atrial flutter -CHA2DS2-VASc 4 -Anticoagulation currently on hold due to thrombocytopenia  Hypertension -Continue hydralazine,  Imdur  Polycystic morphology kidneys -Follow-up outpatient    DVT prophylaxis:  SCDs Start: 03/19/20 1051  Code Status: Full code Family Communication: None at bedside Disposition Plan:  Status is: Inpatient  Remains inpatient appropriate because:IV treatments appropriate due to intensity of illness or inability to take PO and Inpatient level of care appropriate due to severity of illness   Dispo: The patient is from: Home              Anticipated d/c is to: Home              Anticipated d/c date is: 2 days              Patient currently is not medically stable to d/c.   Difficult to place patient No   Antimicrobials:  Anti-infectives (From admission, onward)   Start     Dose/Rate Route Frequency Ordered Stop   03/19/20 2200  piperacillin-tazobactam (ZOSYN) IVPB 3.375 g        3.375 g 12.5 mL/hr over 240 Minutes Intravenous Every 12 hours 03/19/20 1020     03/19/20 2200  acyclovir (ZOVIRAX) 200 MG capsule 200 mg        200 mg Oral 2 times daily 03/19/20 1611     03/19/20 0300  piperacillin-tazobactam (ZOSYN) IVPB 2.25 g        2.25 g 100 mL/hr over 30 Minutes Intravenous Every 8 hours 03/19/20 0228 03/19/20 1643        Objective: Vitals:   03/20/20 2052 03/21/20 0448 03/21/20 0500 03/21/20 1354  BP: (!) 150/96 (!) 161/90  (!) 150/83  Pulse: 68 70  72  Resp: 20 20  (!) 25  Temp: 97.8 F (36.6 C) 97.6 F (36.4  C)  (!) 97.3 F (36.3 C)  TempSrc: Oral Oral  Oral  SpO2: 99% 98%  100%  Weight:   87.4 kg   Height:        Intake/Output Summary (Last 24 hours) at 03/21/2020 1512 Last data filed at 03/21/2020 0645 Gross per 24 hour  Intake 660.05 ml  Output 1250 ml  Net -589.95 ml   Filed Weights   03/19/20 1915 03/20/20 1005 03/21/20 0500  Weight: 87 kg 86.3 kg 87.4 kg    Examination:  General exam: Appears calm and comfortable  Respiratory system: Clear to auscultation. Respiratory effort normal. No respiratory distress. No conversational dyspnea.   Cardiovascular system: S1 & S2 heard, RRR. No murmurs. No pedal edema. Gastrointestinal system: Abdomen is nondistended, soft and nontender. Normal bowel sounds heard. Central nervous system: Alert and oriented. No focal neurological deficits. Speech clear.  Extremities: Symmetric in appearance  Skin: No rashes, lesions or ulcers on exposed skin  Psychiatry: Judgement and insight appear normal. Mood & affect appropriate.   Data Reviewed: I have personally reviewed following labs and imaging studies  CBC: Recent Labs  Lab 03/18/20 1747 03/19/20 0624 03/19/20 1118 03/20/20 0414 03/21/20 0500  WBC 7.4 9.2 11.0* 12.9* 9.0  NEUTROABS 6.0 8.4* 10.1*  --   --   HGB 11.8* 11.9* 10.6* 10.4* 9.9*  HCT 35.4* 35.3* 31.9* 31.0* 28.8*  MCV 95.2 95.7 94.9 95.1 93.8  PLT 8* 44* 38* 35* 35*   Basic Metabolic Panel: Recent Labs  Lab 03/19/20 0623 03/19/20 0624 03/19/20 1118 03/19/20 1544 03/20/20 0414 03/21/20 0500  NA 128*  --  130* 130* 130* 131*  K 4.4  --  4.5 4.7 4.7 4.0  CL 98  --  96* 99 100 95*  CO2 16*  --  16* 17* 16* 25  GLUCOSE 133*  --  165* 140* 127* 100*  BUN 57*  --  62* 60* 71* 76*  CREATININE 3.52*  --  3.87* 4.14* 4.43* 4.42*  CALCIUM 8.3*  --  8.1* 8.2* 8.4* 8.1*  MG  --  2.1 2.2  --   --   --   PHOS  --  4.2 4.8*  --   --   --    GFR: Estimated Creatinine Clearance: 13.6 mL/min (A) (by C-G formula based on SCr of 4.42 mg/dL (H)). Liver Function Tests: Recent Labs  Lab 03/19/20 1118 03/20/20 0414  AST 21 17  ALT 12 11  ALKPHOS 53 47  BILITOT 1.0 0.9  PROT 5.0* 4.9*  ALBUMIN 3.1* 2.9*   No results for input(s): LIPASE, AMYLASE in the last 168 hours. No results for input(s): AMMONIA in the last 168 hours. Coagulation Profile: No results for input(s): INR, PROTIME in the last 168 hours. Cardiac Enzymes: Recent Labs  Lab 03/19/20 0624  CKTOTAL 36*   BNP (last 3 results) No results for input(s): PROBNP in the last 8760 hours. HbA1C: No results for  input(s): HGBA1C in the last 72 hours. CBG: No results for input(s): GLUCAP in the last 168 hours. Lipid Profile: No results for input(s): CHOL, HDL, LDLCALC, TRIG, CHOLHDL, LDLDIRECT in the last 72 hours. Thyroid Function Tests: Recent Labs    03/19/20 1119  TSH 1.725   Anemia Panel: No results for input(s): VITAMINB12, FOLATE, FERRITIN, TIBC, IRON, RETICCTPCT in the last 72 hours. Sepsis Labs: Recent Labs  Lab 03/19/20 2542  LATICACIDVEN 1.7    Recent Results (from the past 240 hour(s))  Urine culture  Status: Abnormal   Collection Time: 03/18/20  6:57 PM   Specimen: Urine, Random  Result Value Ref Range Status   Specimen Description   Final    URINE, RANDOM Performed at [facility] [address] [place], [place] [address]    Special Requests   Final    NONE Performed at [facility], [place] [address], [place], [place] [address]    Culture (A)  Final    <10,000 COLONIES/mL INSIGNIFICANT GROWTH Performed at [facility] [address] [place], [place] [address]    Report Status 03/20/2020 FINAL  Final  SARS CORONAVIRUS 2 (TAT 6-24 HRS) Nasopharyngeal Nasopharyngeal Swab     Status: Abnormal   Collection Time: 03/19/20  2:04 AM   Specimen: Nasopharyngeal Swab  Result Value Ref Range Status   SARS Coronavirus 2 POSITIVE (A) NEGATIVE Final    Comment: (NOTE) SARS-CoV-2 target nucleic acids are DETECTED.  The SARS-CoV-2 RNA is generally detectable in upper and lower respiratory specimens during the acute phase of infection. Positive results are indicative of the presence of SARS-CoV-2 RNA. Clinical correlation with patient history and other diagnostic information is  necessary to determine patient infection status. Positive results do not rule out bacterial infection or co-infection with other viruses.  The expected result is Negative.  Fact Sheet for Patients: SugarRoll.be  Fact  Sheet for Healthcare Providers: https://www.woods-mathews.com/  This test is not yet approved or cleared by the [place] FDA and  has been authorized for detection and/or diagnosis of SARS-CoV-2 by FDA under an Emergency Use Authorization (EUA). This EUA will remain  in effect (meaning this test can be used) for the duration of the COVID-19 declaration under Section 564(b)(1) of the Act, 21 U. S.C. section 360bbb-3(b)(1), unless the authorization is terminated or revoked sooner.   Performed at [facility], [place] [address] [place], [place] [address]   Stool culture (children & immunocomp patients)     Status: None   Collection Time: 03/19/20 10:52 AM   Specimen: Perirectal; Stool  Result Value Ref Range Status   Salmonella/Shigella Screen NSTCUL  Final    Comment: (NOTE) Test not performed. No stool culture transport device received.      Debbie Chan. was notified 03/20/2020.    Campylobacter Culture NSTCUL  Final    Comment: (NOTE) Test not performed. No stool culture transport device received.      Debbie Chan. was notified 03/20/2020.    E coli, Shiga toxin Assay NSTCUL  Final    Comment: (NOTE) Test not performed. No stool culture transport device received.      Debbie Chan. was notified 03/20/2020. Performed At: [facility] [place], [place] [id number] Rush Farmer MD CM:[id number]   Gastrointestinal Panel by PCR , Stool     Status: None   Collection Time: 03/19/20 10:52 AM   Specimen: Perirectal; Stool  Result Value Ref Range Status   Campylobacter species NOT DETECTED NOT DETECTED Final   Plesimonas shigelloides NOT DETECTED NOT DETECTED Final   Salmonella species NOT DETECTED NOT DETECTED Final   Yersinia enterocolitica NOT DETECTED NOT DETECTED Final   Vibrio species NOT DETECTED NOT DETECTED Final   Vibrio cholerae NOT DETECTED NOT DETECTED Final   Enteroaggregative E coli (EAEC) NOT DETECTED NOT DETECTED Final    Enteropathogenic E coli (EPEC) NOT DETECTED NOT DETECTED Final   Enterotoxigenic E coli (ETEC) NOT DETECTED NOT DETECTED Final   Shiga like toxin producing E  coli (STEC) NOT DETECTED NOT DETECTED Final   Shigella/Enteroinvasive E coli (EIEC) NOT DETECTED NOT DETECTED Final   Cryptosporidium NOT DETECTED NOT DETECTED Final   Cyclospora cayetanensis NOT DETECTED NOT DETECTED Final   Entamoeba histolytica NOT DETECTED NOT DETECTED Final   Giardia lamblia NOT DETECTED NOT DETECTED Final   Adenovirus F40/41 NOT DETECTED NOT DETECTED Final   Astrovirus NOT DETECTED NOT DETECTED Final   Norovirus GI/GII NOT DETECTED NOT DETECTED Final   Rotavirus A NOT DETECTED NOT DETECTED Final   Sapovirus (I, II, IV, and V) NOT DETECTED NOT DETECTED Final    Comment: Performed at [facility], [address], [place], [place] [address]      Radiology Studies: US RENAL  Result Date: 03/20/2020 CLINICAL DATA:  Chronic kidney disease, acute renal failure EXAM: RENAL / URINARY TRACT ULTRASOUND COMPLETE COMPARISON:  None. FINDINGS: Right Kidney: Renal measurements: 12.9 x 5.8 x 6.5 cm = volume: 257.6 mL. Echogenicity is likely increased. Numerous cysts are present. No hydronephrosis visualized. Left Kidney: Renal measurements: 17.2 x 7.8 x 8.2 cm = volume: 576.5 mL. Echogenicity is likely increased. Numerous cysts are present. No hydronephrosis visualized. Bladder: Not visualized due to recent voiding. Other: None. IMPRESSION: No hydronephrosis. Numerous bilateral renal cysts. Increased echogenicity likely reflecting chronic medical renal disease. Electronically Signed   By: Macy Mis M.D.   On: 03/20/2020 11:05   DG Chest Port 1 View  Result Date: 03/21/2020 CLINICAL DATA:  Cough.  COVID-19 positive EXAM: PORTABLE CHEST 1 VIEW COMPARISON:  March 19, 2020 FINDINGS: Minimal left pleural effusion. No edema or airspace opacity. There is cardiomegaly with pulmonary vascularity normal. No adenopathy.  Port-A-Cath tip is in the superior vena cava near the cavoatrial junction. Bones osteoporotic. IMPRESSION: Minimal left pleural effusion. No edema or airspace opacity. Stable cardiomegaly. Osteoporosis. Stable Port-A-Cath position. Electronically Signed   By: Lowella Grip III M.D.   On: 03/21/2020 13:30   DG CHEST PORT 1 VIEW  Result Date: 03/19/2020 CLINICAL DATA:  70 year old female with weakness. EXAM: PORTABLE CHEST 1 VIEW COMPARISON:  Chest radiograph dated 03/19/2020. FINDINGS: Right sided Port-A-Cath in similar position. There is shallow inspiration. Probable small left and trace right pleural effusions. Minimal left lung base atelectasis. Pneumonia is not excluded. No pneumothorax. No acute osseous pathology. Osteopenia. IMPRESSION: Shallow inspiration with probable small left and trace right pleural effusions. No significant interval change since the prior radiograph. Electronically Signed   By: Anner Crete M.D.   On: 03/19/2020 16:58      Scheduled Meds: . acyclovir  200 mg Oral BID  . Chlorhexidine Gluconate Cloth  6 each Topical Daily  . dexamethasone (DECADRON) injection  4 mg Intravenous Q24H  . hydrALAZINE  37.5 mg Oral Q8H  . isosorbide mononitrate  30 mg Oral BID  . loratadine  10 mg Oral Daily  . sodium chloride flush  10-40 mL Intracatheter Q12H   Continuous Infusions: . piperacillin-tazobactam (ZOSYN)  IV 3.375 g (03/21/20 0953)  .  sodium bicarbonate (isotonic) infusion in sterile water 75 mL/hr at 03/21/20 0115     LOS: 3 days      Time spent: 25 minutes   Dessa Phi, DO [facility] 03/21/2020, 3:12 PM   Available via Epic secure chat 7am-7pm After these hours, please refer to coverage provider listed on [email or web address]

## 2020-03-21 NOTE — Progress Notes (Signed)
Pt non-compliant with calling for help to get out of bed. Pt continues to get out of bed without calling for help despite multiple teachings about why it is important to do so. Will continue to monitor and continue to educate.

## 2020-03-21 NOTE — Evaluation (Signed)
Occupational Therapy Evaluation Patient Details Name: Debbie Chan MRN: [id number] DOB: [birth year] Today's Date: 03/21/2020    History of Present Illness 70 y.o. female with a history of multiple myeloma, atrial flutter, chronic anemia. Patient presented secondary to weakness and found to have severe thrombocytopenia and AKI. She was started on IV fluids and given platelet transfusion. While admitted she was incidentally found to be COVID-19 positive.   Clinical Impression   Patient reports she lives with her son and his family and is I at baseline. Patient ambulates in room, performed 5 sit to stand from toilet in bathroom, handled peri care and hand hygiene all without physical assistance and no loss of balance during session. Patient appears at her baseline with mobility and self care therefore will sign off at this time. Please re-consult if new needs arise.     Follow Up Recommendations  No OT follow up    Equipment Recommendations  None recommended by OT       Precautions / Restrictions Restrictions Weight Bearing Restrictions: No      Mobility Bed Mobility               General bed mobility comments: in recliner    Transfers Overall transfer level: Independent Equipment used: None                  Balance Overall balance assessment: No apparent balance deficits (not formally assessed)                                         ADL either performed or assessed with clinical judgement   ADL Overall ADL's : Independent                                       General ADL Comments: patient performed 5 STS from bathroom toilet without physical assist, then performed peri care after voiding, washed her hands and ambulated around room without physical assistance                  Pertinent Vitals/Pain Pain Assessment: Faces Faces Pain Scale: No hurt     Hand Dominance  (did not specify)   Extremity/Trunk Assessment  Upper Extremity Assessment Upper Extremity Assessment: Overall WFL for tasks assessed   Lower Extremity Assessment Lower Extremity Assessment: Defer to PT evaluation   Cervical / Trunk Assessment Cervical / Trunk Assessment: Normal   Communication Communication Communication: Prefers language other than English   Cognition Arousal/Alertness: Awake/alert Behavior During Therapy: WFL for tasks assessed/performed Overall Cognitive Status: Within Functional Limits for tasks assessed                                 General Comments: patient following directions appropriately              Home Living Family/patient expects to be discharged to:: Private residence Living Arrangements: Children;Other relatives Available Help at Discharge: Family;Available PRN/intermittently Type of Home: Apartment       Home Layout: Able to live on main level with bedroom/bathroom               Home Equipment: None          Prior Functioning/Environment Level of Independence: Independent  OT Problem List: Decreased activity tolerance         OT Goals(Current goals can be found in the care plan section) Acute Rehab OT Goals Patient Stated Goal: use bathroom OT Goal Formulation: All assessment and education complete, DC therapy   AM-PAC OT "6 Clicks" Daily Activity     Outcome Measure Help from another person eating meals?: None Help from another person taking care of personal grooming?: None Help from another person toileting, which includes using toliet, bedpan, or urinal?: None Help from another person bathing (including washing, rinsing, drying)?: None Help from another person to put on and taking off regular upper body clothing?: None Help from another person to put on and taking off regular lower body clothing?: None 6 Click Score: 24   End of Session  Activity Tolerance: Patient tolerated treatment well Patient left: in chair;with call  bell/phone within reach  OT Visit Diagnosis: Other abnormalities of gait and mobility (R26.89)                Time: 9980-6999 OT Time Calculation (min): 20 min Charges:  OT General Charges $OT Visit: 1 Visit OT Evaluation $OT Eval Low Complexity: 1 Low  Delbert Phenix OT OT pager: [place] 03/21/2020, 1:09 PM

## 2020-03-22 ENCOUNTER — Inpatient Hospital Stay: Payer: Self-pay

## 2020-03-22 LAB — CBC
HCT: 28.8 % — ABNORMAL LOW (ref 36.0–46.0)
Hemoglobin: 9.7 g/dL — ABNORMAL LOW (ref 12.0–15.0)
MCH: 31.9 pg (ref 26.0–34.0)
MCHC: 33.7 g/dL (ref 30.0–36.0)
MCV: 94.7 fL (ref 80.0–100.0)
Platelets: 39 10*3/uL — ABNORMAL LOW (ref 150–400)
RBC: 3.04 MIL/uL — ABNORMAL LOW (ref 3.87–5.11)
RDW: 16.6 % — ABNORMAL HIGH (ref 11.5–15.5)
WBC: 5.8 10*3/uL (ref 4.0–10.5)
nRBC: 0.5 % — ABNORMAL HIGH (ref 0.0–0.2)

## 2020-03-22 LAB — BASIC METABOLIC PANEL
Anion gap: 13 (ref 5–15)
BUN: 70 mg/dL — ABNORMAL HIGH (ref 8–23)
CO2: 27 mmol/L (ref 22–32)
Calcium: 8.2 mg/dL — ABNORMAL LOW (ref 8.9–10.3)
Chloride: 96 mmol/L — ABNORMAL LOW (ref 98–111)
Creatinine, Ser: 4.29 mg/dL — ABNORMAL HIGH (ref 0.44–1.00)
GFR, Estimated: 11 mL/min — ABNORMAL LOW (ref >60)
Glucose, Bld: 87 mg/dL (ref 70–99)
Potassium: 3.6 mmol/L (ref 3.5–5.1)
Sodium: 136 mmol/L (ref 135–145)

## 2020-03-22 MED ORDER — SODIUM CHLORIDE 0.9 % IV SOLN: INTRAVENOUS | Status: DC

## 2020-03-22 NOTE — Progress Notes (Signed)
PROGRESS NOTE    Debbie Chan  BCW:[id number] DOB: 1950/05/18 DOA: 03/18/2020 PCP: [name], MD     Brief Narrative:  Debbie Chan is a 70 year old female with past medical history significant for multiple myeloma, atrial flutter, chronic anemia who presented to the hospital with complaints of weakness.  She was found to have severe thrombocytopenia and AKI.  She was given IV fluid, platelet transfusions.  She was also incidentally found to have COVID-19.  New events last 24 hours / Subjective: Patient seen with audio interpreter available.  Patient denies any shortness of breath, having some pain in her low back.  Discussed with her her kidney failure, heart failure and underlying multiple myeloma.   Assessment & Plan:   Active Problems:   Anemia   AKI (acute kidney injury) ([place])   Atrial flutter (HCC)   Multiple myeloma without remission (HCC)   Thrombocytopenia (HCC)   Colitis   Hematuria   Polycystic kidney   Hyponatremia   Dehydration   AKI on CKD stage IIIb -Baseline creatinine 1.2 -Renal ultrasound without sign of obstruction -Could be secondary to dehydration, myeloma kidney, heart failure -Nephrology following -Continue sodium bicarb fluids -Slight improvement in creatinine this morning 4.29, continue to monitor closely  Acute systolic heart failure -BNP 1809.1 --> 594.1 -Suspect cardiomyopathy could be secondary to cardiotoxic effects from carfilzomib  -Cardiology following  COVID-19 -Incidental finding.  On room air.  CRP 0.9  Thrombocytopenia -Presented with platelet 8000 on admission.  Transfused 1 unit platelets -Hematology/oncology following.  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  Transfuse for platelets <20,000 for acute bleeding -Follow CBC  Acute colitis -GI PCR negative -CT abdomen pelvis reveals colitis of the distal descending and sigmoid colon -Zosyn --> Augmentin  Multiple  myeloma -Follows with [name]-Continue Decadron  Paroxysmal atrial flutter -CHA2DS2-VASc 4 -Anticoagulation currently on hold due to thrombocytopenia  Hypertension -Continue hydralazine, Imdur  Polycystic morphology kidneys -Follow-up outpatient    DVT prophylaxis:  SCDs Start: 03/19/20 1051  Code Status: Full code Family Communication: None at bedside; spoke with son over the phone today for an update and recommendation for palliative care medicine consultation Disposition Plan:  Status is: Inpatient  Remains inpatient appropriate because:IV treatments appropriate due to intensity of illness or inability to take PO and Inpatient level of care appropriate due to severity of illness   Dispo: The patient is from: Home              Anticipated d/c is to: Home              Anticipated d/c date is: 2 days              Patient currently is not medically stable to d/c.   Difficult to place patient No   Antimicrobials:  Anti-infectives (From admission, onward)   Start     Dose/Rate Route Frequency Ordered Stop   03/21/20 2200  amoxicillin-clavulanate (AUGMENTIN) 500-125 MG per tablet 500 mg        1 tablet Oral 2 times daily 03/21/20 1527 03/26/20 0959   03/19/20 2200  piperacillin-tazobactam (ZOSYN) IVPB 3.375 g  Status:  Discontinued        3.375 g 12.5 mL/hr over 240 Minutes Intravenous Every 12 hours 03/19/20 1020 03/21/20 1527   03/19/20 2200  acyclovir (ZOVIRAX) 200 MG capsule 200 mg        200 mg Oral 2 times daily 03/19/20 1611     03/19/20  0300  piperacillin-tazobactam (ZOSYN) IVPB 2.25 g        2.25 g 100 mL/hr over 30 Minutes Intravenous Every 8 hours 03/19/20 0228 03/19/20 1643       Objective: Vitals:   03/21/20 1354 03/21/20 2138 03/22/20 0512 03/22/20 1317  BP: (!) 150/83 (!) 164/90 (!) 169/94 (!) 160/85  Pulse: 72 80 87 87  Resp: (!) _0 Temp: (!) 97.3 F (36.3 C) 97.7 F (36.5 C) 98.6 F (37 C) 98.4 F (36.9 C)  TempSrc: Oral Oral  Oral Oral  SpO2: 100% 99% 97% 100%  Weight:   87.3 kg   Height:        Intake/Output Summary (Last 24 hours) at 03/22/2020 1334 Last data filed at 03/22/2020 1250 Gross per 24 hour  Intake --  Output 2250 ml  Net -2250 ml   Filed Weights   03/20/20 1005 03/21/20 0500 03/22/20 0512  Weight: 86.3 kg 87.4 kg 87.3 kg    Examination: General exam: Appears calm and comfortable  Respiratory system: Clear to auscultation. Respiratory effort normal.  On room air Cardiovascular system: S1 & S2 heard, RRR. No pedal edema. Gastrointestinal system: Abdomen is nondistended, soft and nontender. Normal bowel sounds heard. Central nervous system: Alert and oriented. Non focal exam. Speech clear  Extremities: Symmetric in appearance bilaterally  Skin: No rashes, lesions or ulcers on exposed skin  Psychiatry: Judgement and insight appear stable. Mood & affect appropriate.    Data Reviewed: I have personally reviewed following labs and imaging studies  CBC: Recent Labs  Lab 03/18/20 1747 03/19/20 0624 03/19/20 1118 03/20/20 0414 03/21/20 0500 03/22/20 0503  WBC 7.4 9.2 11.0* 12.9* 9.0 5.8  NEUTROABS 6.0 8.4* 10.1*  --   --   --   HGB 11.8* 11.9* 10.6* 10.4* 9.9* 9.7*  HCT 35.4* 35.3* 31.9* 31.0* 28.8* 28.8*  MCV 95.2 95.7 94.9 95.1 93.8 94.7  PLT 8* 44* 38* 35* 35* 39*   Basic Metabolic Panel: Recent Labs  Lab 03/19/20 0624 03/19/20 1118 03/19/20 1544 03/20/20 0414 03/21/20 0500 03/22/20 0503  NA  --  130* 130* 130* 131* 136  K  --  4.5 4.7 4.7 4.0 3.6  CL  --  96* 99 100 95* 96*  CO2  --  16* 17* 16* 25 27  GLUCOSE  --  165* 140* 127* 100* 87  BUN  --  62* 60* 71* 76* 70*  CREATININE  --  3.87* 4.14* 4.43* 4.42* 4.29*  CALCIUM  --  8.1* 8.2* 8.4* 8.1* 8.2*  MG 2.1 2.2  --   --   --   --   PHOS 4.2 4.8*  --   --   --   --    GFR: Estimated Creatinine Clearance: 14 mL/min (A) (by C-G formula based on SCr of 4.29 mg/dL (H)). Liver Function Tests: Recent Labs  Lab  03/19/20 1118 03/20/20 0414  AST 21 17  ALT 12 11  ALKPHOS 53 47  BILITOT 1.0 0.9  PROT 5.0* 4.9*  ALBUMIN 3.1* 2.9*   No results for input(s): LIPASE, AMYLASE in the last 168 hours. No results for input(s): AMMONIA in the last 168 hours. Coagulation Profile: No results for input(s): INR, PROTIME in the last 168 hours. Cardiac Enzymes: Recent Labs  Lab 03/19/20 0624  CKTOTAL 36*   BNP (last 3 results) No results for input(s): PROBNP in the last 8760 hours. HbA1C: No results for input(s): HGBA1C in the last 72 hours. CBG: No  results for input(s): GLUCAP in the last 168 hours. Lipid Profile: No results for input(s): CHOL, HDL, LDLCALC, TRIG, CHOLHDL, LDLDIRECT in the last 72 hours. Thyroid Function Tests: No results for input(s): TSH, T4TOTAL, FREET4, T3FREE, THYROIDAB in the last 72 hours. Anemia Panel: No results for input(s): VITAMINB12, FOLATE, FERRITIN, TIBC, IRON, RETICCTPCT in the last 72 hours. Sepsis Labs: Recent Labs  Lab 03/19/20 1751  LATICACIDVEN 1.7    Recent Results (from the past 240 hour(s))  Urine culture     Status: Abnormal   Collection Time: 03/18/20  6:57 PM   Specimen: Urine, Random  Result Value Ref Range Status   Specimen Description   Final    URINE, RANDOM Performed at [facility] [address] [place], [place] [address]    Special Requests   Final    NONE Performed at [facility], [place] [address] [place], [place] [address]    Culture (A)  Final    <10,000 COLONIES/mL INSIGNIFICANT GROWTH Performed at [facility] [address] [place], [place] [address]    Report Status 03/20/2020 FINAL  Final  SARS CORONAVIRUS 2 (TAT 6-24 HRS) Nasopharyngeal Nasopharyngeal Swab     Status: Abnormal   Collection Time: 03/19/20  2:04 AM   Specimen: Nasopharyngeal Swab  Result Value Ref Range Status   SARS Coronavirus 2 POSITIVE (A) NEGATIVE Final    Comment: (NOTE) SARS-CoV-2 target nucleic  acids are DETECTED.  The SARS-CoV-2 RNA is generally detectable in upper and lower respiratory specimens during the acute phase of infection. Positive results are indicative of the presence of SARS-CoV-2 RNA. Clinical correlation with patient history and other diagnostic information is  necessary to determine patient infection status. Positive results do not rule out bacterial infection or co-infection with other viruses.  The expected result is Negative.  Fact Sheet for Patients: SugarRoll.be  Fact Sheet for Healthcare Providers: https://www.woods-mathews.com/  This test is not yet approved or cleared by the [place] FDA and  has been authorized for detection and/or diagnosis of SARS-CoV-2 by FDA under an Emergency Use Authorization (EUA). This EUA will remain  in effect (meaning this test can be used) for the duration of the COVID-19 declaration under Section 564(b)(1) of the Act, 21 U. S.C. section 360bbb-3(b)(1), unless the authorization is terminated or revoked sooner.   Performed at [facility], [place] [address] [place], [place] [address]   Stool culture (children & immunocomp patients)     Status: None   Collection Time: 03/19/20 10:52 AM   Specimen: Perirectal; Stool  Result Value Ref Range Status   Salmonella/Shigella Screen NSTCUL  Final    Comment: (NOTE) Test not performed. No stool culture transport device received.      Emani R. was notified 03/20/2020.    Campylobacter Culture NSTCUL  Final    Comment: (NOTE) Test not performed. No stool culture transport device received.      Emani R. was notified 03/20/2020.    E coli, Shiga toxin Assay NSTCUL  Final    Comment: (NOTE) Test not performed. No stool culture transport device received.      Emani R. was notified 03/20/2020. Performed At: [facility] [place], [place] [id number] Rush Farmer MD PP:[id number]   Gastrointestinal  Panel by PCR , Stool     Status: None   Collection Time: 03/19/20 10:52 AM   Specimen: Perirectal; Stool  Result Value Ref Range Status   Campylobacter species NOT DETECTED  NOT DETECTED Final   Plesimonas shigelloides NOT DETECTED NOT DETECTED Final   Salmonella species NOT DETECTED NOT DETECTED Final   Yersinia enterocolitica NOT DETECTED NOT DETECTED Final   Vibrio species NOT DETECTED NOT DETECTED Final   Vibrio cholerae NOT DETECTED NOT DETECTED Final   Enteroaggregative E coli (EAEC) NOT DETECTED NOT DETECTED Final   Enteropathogenic E coli (EPEC) NOT DETECTED NOT DETECTED Final   Enterotoxigenic E coli (ETEC) NOT DETECTED NOT DETECTED Final   Shiga like toxin producing E coli (STEC) NOT DETECTED NOT DETECTED Final   Shigella/Enteroinvasive E coli (EIEC) NOT DETECTED NOT DETECTED Final   Cryptosporidium NOT DETECTED NOT DETECTED Final   Cyclospora cayetanensis NOT DETECTED NOT DETECTED Final   Entamoeba histolytica NOT DETECTED NOT DETECTED Final   Giardia lamblia NOT DETECTED NOT DETECTED Final   Adenovirus F40/41 NOT DETECTED NOT DETECTED Final   Astrovirus NOT DETECTED NOT DETECTED Final   Norovirus GI/GII NOT DETECTED NOT DETECTED Final   Rotavirus A NOT DETECTED NOT DETECTED Final   Sapovirus (I, II, IV, and V) NOT DETECTED NOT DETECTED Final    Comment: Performed at [facility], [address] [place], [place] [address]      Radiology Studies: DG Chest Port 1 View  Result Date: 03/21/2020 CLINICAL DATA:  Cough.  COVID-19 positive EXAM: PORTABLE CHEST 1 VIEW COMPARISON:  March 19, 2020 FINDINGS: Minimal left pleural effusion. No edema or airspace opacity. There is cardiomegaly with pulmonary vascularity normal. No adenopathy. Port-A-Cath tip is in the superior vena cava near the cavoatrial junction. Bones osteoporotic. IMPRESSION: Minimal left pleural effusion. No edema or airspace opacity. Stable cardiomegaly. Osteoporosis. Stable Port-A-Cath position.  Electronically Signed   By: William  Woodruff III M.D.   On: 03/21/2020 13:30      Scheduled Meds: . acyclovir  200 mg Oral BID  . amoxicillin-clavulanate  1 tablet Oral BID  . Chlorhexidine Gluconate Cloth  6 each Topical Daily  . dexamethasone (DECADRON) injection  4 mg Intravenous Q24H  . hydrALAZINE  37.5 mg Oral Q8H  . isosorbide mononitrate  30 mg Oral BID  . loratadine  10 mg Oral Daily  . sodium chloride flush  10-40 mL Intracatheter Q12H   Continuous Infusions: .  sodium bicarbonate (isotonic) infusion in sterile water 65 mL/hr at 03/22/20 0834     LOS: 4 days      Time spent: 25 minutes   Jennifer Choi, DO [facility] 03/22/2020, 1:34 PM   Available via Epic secure chat 7am-7pm After these hours, please refer to coverage provider listed on [email or web address]  

## 2020-03-22 NOTE — Progress Notes (Signed)
[facility] Progress Note  Subjective: good UOP on IVF"s, 1.7 L yest and 1.5 so far today, creat down today 4.2.  Pt w/o SOB or cough  Vitals:   03/21/20 1354 03/21/20 2138 03/22/20 0512 03/22/20 1317  BP: (!) 150/83 (!) 164/90 (!) 169/94 (!) 160/85  Pulse: 72 80 87 87  Resp: (!) '25 20 20 19  ' Temp: (!) 97.3 F (36.3 C) 97.7 F (36.5 C) 98.6 F (37 C) 98.4 F (36.9 C)  TempSrc: Oral Oral Oral Oral  SpO2: 100% 99% 97% 100%  Weight:   87.3 kg   Height:        Exam: alert, nad   no jvd  Chest cta bilat  Cor reg no RG  Abd soft ntnd no ascites   Ext 1+ pretib  edema   Alert, NF, ox3, no asterixis     Home meds:  - lasix 40 qd/ metoprolol 25 bid  - prilosec 20/ decadron 56m q 7d/ zovirax 200 bid  - chemoRx = Cytoxan, decadron, carfilzomib     UA 11-20 rbc, 0-5 wbc, rare bact, > 300 prot    UNa < 10, UCr 143    Renal [place]/25 -  IMPRESSION: No hydronephrosis. Numerous bilateral renal cysts. Increased echogenicity likely reflecting chronic medical renal disease.      ECHO 1/24 - LVEF 25-30%      CXR - IMPRESSION: Shallow inspiration with probable small left and trace right pleural effusions. No significant interval change since the prior Radiograph       CT abd - Adrenals/Urinary Tract: Polycystic morphology of the kidneys with replacement of the majority of the renal parenchyma by cysts. Evaluation of the renal cyst is limited on this noncontrast CT. There is no hydronephrosis or nephrolithiasis on either side. The urinary bladder is grossly unremarkable.         Alb 2.9  Ca 8.4 corr Ca 9.2   Tbili 1.0  Phos 4.8   WBC 11K plt 38k  Hb 10.6                      Date                Creat               eGFR         2019- 01/2020     1.15- 1.45        32- 50 ml/min          Jan 7                   1.17                 51, IIIa         Mar 14, 2020       1.24                 47, IIIa         Jan 23                 3.31                 15         Jan 24                  3.52         Jan 25  4.43                 10  Assessment/ Plan: 1. AKI on CKD 3b - b/l creat 1.1- 1.4 in Dec 2021, eGFR 32- 50. Creat 3.3 on admit 1/23 and up to 4.43 today.  UA w/ rbc's and proteinuria. UPC ratio is 1.8. Renal US w/o signs of obstruction. UA w/ low Na c/w vol depletion and/or syst CHF. Haptoglobin is low. Possible causes of AKI including dehydration, myeloma kidney, TMA d/t chemo, heart failure. Hem- Onc is following. Creat down to 4.2 this am. Spoke w/ pt's son on the phone this am and told him hopefully will not need dialysis and that renal fxn for now is improving. Will lower IVF to 30 cc/hr. Pt is eating. Will follow.  2. COVID infection - asymptomatic, per pmd 3. Acute syst CHF - LVEF 25-30%, no vol overload on exam 4. Multiple myeloma - dx'd in 2019, w/o remission - per ONC, on cytoxan/ carfilzomib/ steroids 5. Thrombocytopenia - acute on chronic, per Hem-onc.  6. Prognosis - guarded  7. Anemia - Hb 10's 8. HTN - getting norvasc, BP's slightly up      Rob Jennesis Ramaswamy 03/22/2020, 3:53 PM   Recent Labs  Lab 03/19/20 0624 03/19/20 1118 03/19/20 1544 03/21/20 0500 03/22/20 0503  K  --  4.5   < > 4.0 3.6  BUN  --  62*   < > 76* 70*  CREATININE  --  3.87*   < > 4.42* 4.29*  CALCIUM  --  8.1*   < > 8.1* 8.2*  PHOS 4.2 4.8*  --   --   --   HGB 11.9* 10.6*   < > 9.9* 9.7*   < > = values in this interval not displayed.   Inpatient medications: . acyclovir  200 mg Oral BID  . amoxicillin-clavulanate  1 tablet Oral BID  . Chlorhexidine Gluconate Cloth  6 each Topical Daily  . dexamethasone (DECADRON) injection  4 mg Intravenous Q24H  . hydrALAZINE  37.5 mg Oral Q8H  . isosorbide mononitrate  30 mg Oral BID  . loratadine  10 mg Oral Daily  . sodium chloride flush  10-40 mL Intracatheter Q12H   .  sodium bicarbonate (isotonic) infusion in sterile water 65 mL/hr at 03/22/20 0834   acetaminophen **OR** acetaminophen, hydrALAZINE,  HYDROcodone-acetaminophen, melatonin, sodium chloride flush

## 2020-03-22 NOTE — Progress Notes (Addendum)
Progress Note  Patient Name: Debbie Chan Date of Encounter: 03/22/2020  [facility] Cardiologist: Glori Bickers, MD   Subjective   No chest pain   Inpatient Medications    Scheduled Meds:  acyclovir  200 mg Oral BID   amoxicillin-clavulanate  1 tablet Oral BID   Chlorhexidine Gluconate Cloth  6 each Topical Daily   dexamethasone (DECADRON) injection  4 mg Intravenous Q24H   hydrALAZINE  37.5 mg Oral Q8H   isosorbide mononitrate  30 mg Oral BID   loratadine  10 mg Oral Daily   sodium chloride flush  10-40 mL Intracatheter Q12H   Continuous Infusions:   sodium bicarbonate (isotonic) infusion in sterile water 65 mL/hr at 03/22/20 0834   PRN Meds: acetaminophen **OR** acetaminophen, hydrALAZINE, HYDROcodone-acetaminophen, melatonin, sodium chloride flush   Vital Signs    Vitals:   03/21/20 0500 03/21/20 1354 03/21/20 2138 03/22/20 0512  BP:  (!) 150/83 (!) 164/90 (!) 169/94  Pulse:  72 80 87  Resp:  (!) '25 20 20  ' Temp:  (!) 97.3 F (36.3 C) 97.7 F (36.5 C) 98.6 F (37 C)  TempSrc:  Oral Oral Oral  SpO2:  100% 99% 97%  Weight: 87.4 kg   87.3 kg  Height:        Intake/Output Summary (Last 24 hours) at 03/22/2020 1241 Last data filed at 03/22/2020 0500 Gross per 24 hour  Intake --  Output 1950 ml  Net -1950 ml   Last 3 Weights 03/22/2020 03/21/2020 03/20/2020  Weight (lbs) 192 lb 7.4 oz 192 lb 9.6 oz 190 lb 4.8 oz  Weight (kg) 87.3 kg 87.363 kg 86.32 kg      Telemetry    SR with freq PVCs and occ NSVT of 3 beats - Personally Reviewed  ECG    No new - Personally Reviewed  Physical Exam  EXAM per Dr. Sallyanne Kuster    Labs    High Sensitivity Troponin:  No results for input(s): TROPONINIHS in the last 720 hours.    Chemistry Recent Labs  Lab 03/19/20 1118 03/19/20 1544 03/20/20 0414 03/21/20 0500 03/22/20 0503  NA 130*   < > 130* 131* 136  K 4.5   < > 4.7 4.0 3.6  CL 96*   < > 100 95* 96*  CO2 16*   < > 16* 25 27  GLUCOSE 165*   <  > 127* 100* 87  BUN 62*   < > 71* 76* 70*  CREATININE 3.87*   < > 4.43* 4.42* 4.29*  CALCIUM 8.1*   < > 8.4* 8.1* 8.2*  PROT 5.0*  --  4.9*  --   --   ALBUMIN 3.1*  --  2.9*  --   --   AST 21  --  17  --   --   ALT 12  --  11  --   --   ALKPHOS 53  --  47  --   --   BILITOT 1.0  --  0.9  --   --   GFRNONAA 12*   < > 10* 10* 11*  ANIONGAP 18*   < > '14 11 13   ' < > = values in this interval not displayed.     Hematology Recent Labs  Lab 03/20/20 0414 03/21/20 0500 03/22/20 0503  WBC 12.9* 9.0 5.8  RBC 3.26* 3.07* 3.04*  HGB 10.4* 9.9* 9.7*  HCT 31.0* 28.8* 28.8*  MCV 95.1 93.8 94.7  MCH 31.9 32.2 31.9  MCHC 33.5  34.4 33.7  RDW 15.8* 15.9* 16.6*  PLT 35* 35* 39*    BNP Recent Labs  Lab 03/19/20 1715 03/21/20 1235  BNP 1,809.1* 594.1*     DDimer  Recent Labs  Lab 03/19/20 0100  DDIMER 1.36*     Radiology    DG Chest Port 1 View  Result Date: 03/21/2020 CLINICAL DATA:  Cough.  COVID-19 positive EXAM: PORTABLE CHEST 1 VIEW COMPARISON:  March 19, 2020 FINDINGS: Minimal left pleural effusion. No edema or airspace opacity. There is cardiomegaly with pulmonary vascularity normal. No adenopathy. Port-A-Cath tip is in the superior vena cava near the cavoatrial junction. Bones osteoporotic. IMPRESSION: Minimal left pleural effusion. No edema or airspace opacity. Stable cardiomegaly. Osteoporosis. Stable Port-A-Cath position. Electronically Signed   By: Lowella Grip III M.D.   On: 03/21/2020 13:30    Cardiac Studies   03/19/20 echo  IMPRESSIONS    1. Left ventricular ejection fraction, by estimation, is 25 to 30%. The  left ventricle has severely decreased function. The left ventricle  demonstrates global hypokinesis although the septal LV segments appear  slightly worse. Left ventricular diastolic  parameters are consistent with Grade II diastolic dysfunction  (pseudonormalization).  2. Right ventricular systolic function is mildly reduced. The right   ventricular size is moderately enlarged. There is moderately elevated  pulmonary artery systolic pressure.  3. The mitral valve is grossly normal. Mild mitral valve regurgitation.  4. Right atrial size was massively dilated.  5. Tricuspid valve regurgitation is moderate to severe.  6. The aortic valve is tricuspid. There is mild calcification of the  aortic valve. There is mild thickening of the aortic valve. Aortic valve  regurgitation is mild.  7. Aortic dilatation noted. 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  8. The inferior vena cava is normal in size with greater than 50%  respiratory variability, suggesting right atrial pressure of 3 mmHg.   Comparison(s): Compared to prior study on 23/3007, the LV systolic  function is now severely reduced.   FINDINGS  Left Ventricle: Left ventricular ejection fraction, by estimation, is 25  to 30%. The left ventricle has severely decreased function. The left  ventricle demonstrates global hypokinesis although the septal segments  appear slightly worse. The left  ventricular internal cavity size was normal in size. There is no left  ventricular hypertrophy. Left ventricular diastolic parameters are  consistent with Grade II diastolic dysfunction (pseudonormalization).   Right Ventricle: The right ventricular size is moderately enlarged. No  increase in right ventricular wall thickness. Right ventricular systolic  function is mildly reduced. There is moderately elevated pulmonary artery  systolic pressure. The tricuspid  regurgitant velocity is 3.47 m/s, and with an assumed right atrial  pressure of 3 mmHg, the estimated right ventricular systolic pressure is  62.2 mmHg.   Left Atrium: Left atrial size was normal in size.   Right Atrium: Right atrial size was massively dilated.   Pericardium: There is no evidence of pericardial effusion.   Mitral Valve: The mitral valve is grossly normal. There is  mild thickening  of the mitral valve leaflet(s). Mild mitral valve regurgitation.   Tricuspid Valve: The tricuspid valve is normal in structure. Tricuspid  valve regurgitation is moderate to severe.   Aortic Valve: The aortic valve is tricuspid. There is mild calcification  of the aortic valve. There is mild thickening of the aortic valve. Aortic  valve regurgitation is mild. Aortic regurgitation PHT measures 643 msec.   Pulmonic  Valve: The pulmonic valve was normal in structure. Pulmonic valve  regurgitation is trivial.   Aorta: Aortic dilatation noted. 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   Venous: The inferior vena cava is normal in size with greater than 50%  respiratory variability, suggesting right atrial pressure of 3 mmHg.   IAS/Shunts: There is left bowing of the interatrial septum, suggestive of  elevated right atrial pressure. No atrial level shunt detected by color  flow Doppler.     Patient Profile     70 y.o. female with multiple myeloma not in remission, status post 11 cycles of carfilzomib and cyclophosphamide, history of paroxysmal atrial flutter 2019, history of severe tricuspid regurgitation and right heart enlargement, admitted for suspected infectious colitis, acute renal insufficiency and severe thrombocytopenia and found to have severely depressed left ventricular systolic function, new compared to 2019.  Assessment & Plan    1. CHF: concerned that her cough is an equivalent of orthopnea and is a sign of increasing left heart filling pressures.  No evidence of improvement in renal function with administration of fluids so far, although the urinary sodium level was low on admission.  Significant sodium load from Zosyn.  BNP was markedly elevated at 1809 on admission 01/24, substantially higher than the level in 2019.  Repeat BNP decreased to 594 from1809 and chest x-ray with minimal lt pl effusion no edema or airspace opacity stable  cardiomegaly.  Cause of cardiomyopathy is unclear, but the leading suspect is cardiotoxic effect from carfilzomib chemotherapy. 2.  Chronic right heart failure: Severe tricuspid regurgitation, preserved right ventricular systolic function, massively dilated right atrium present since 2019 suggest chronic etiology, mechanism unclear.  At this point without overt findings of increased right heart filling pressures, no edema. 3.  History of paroxysmal atrial flutter: Thankfully, not a problem to date on this admission.  Anticoagulation currently on hold due to markedly worsened renal function.  Intermediate to high embolic risk with a [id number] score of 4. 4. HTN: We will try to use predominantly hydralazine/nitrates in view of abnormal renal function.  Avoid RAAS inhibitors.  I am also concerned that she is at risk for decompensation and will avoid beta-blockers at this time, although ideally these will be started before discharge. 5.  IgA MM: Unfortunately, concerned that the current chemotherapy is causing her cardiac decompensation.  She has failed Velcade and had allergic response to Pomylast.  We will have to discuss alternatives with her oncologist. 6. Ac on CKD: Not improving so far with intravenous fluids.  Kidney ultrasound suggest chronic medical disease with cortical thinning.  However, the worsening of her renal function has been relatively abrupt for 1.2 as recently as January 19.  It is conceivable that she has acute tubular necrosis from hypovolemia from the colitis.  Note markedly increased protein creatinine ratio suggesting possibility of myeloma kidney.  Despite the fact that she is anemic and thrombocytopenic, TTP appears unlikely (no schistocytes on peripheral smear, haptoglobin pending) and it is more likely her hematological abnormalities are related to chemotherapy and myeloma. 7. COVID-19: Probably the cause of her gastroenteritis.  Thankfully so far without any respiratory  manifestations.  Her cough precedes the current hospitalization and Covid infection by several weeks and might have been a sign of fluid overload/heart failure.         For questions or updates, please contact [place] Please consult [email or web address] for contact info under        Signed, Mickel Baas  Dorene Ar, NP  03/22/2020, 12:41 PM    I have seen and examined the patient along with Cecilie Kicks, NP .  I have reviewed the chart, notes and new data.  I agree with PA/NP's note.  Key new complaints: still coughing Key examination changes:  General: Alert, oriented x3, no distress Head: no evidence of trauma, PERRL, EOMI, no exophtalmos or lid lag, no myxedema, no xanthelasma; normal ears, nose and oropharynx Neck: 5 cm jugular venous pulsations with prominent v waves; brisk carotid pulses without delay and no carotid bruits Chest: clear to auscultation, no signs of consolidation by percussion or palpation, normal fremitus, symmetrical and full respiratory excursions Cardiovascular: normal position and quality of the apical impulse, regular rhythm, normal first and second heart sounds, 3/6 holosystolic LLSB murmur, no diastolic murmurs, rubs or gallops Abdomen: no tenderness or distention, no masses by palpation, no abnormal pulsatility or arterial bruits, normal bowel sounds, no hepatosplenomegaly Extremities: no clubbing, cyanosis or edema; 2+ radial, ulnar and brachial pulses bilaterally; 2+ right femoral, posterior tibial and dorsalis pedis pulses; 2+ left femoral, posterior tibial and dorsalis pedis pulses; no subclavian or femoral bruits Neurological: grossly nonfocal Psych: Normal mood and affect  Key new findings / data: despite IV fluids, reportedly net negative fluid balance (suspect "ins" under-recorded) and weight unchanged from yesterday.  No overt CHF on CXR and BNP lower. Renal parameters are essentially unchanged. Frequent PVCs, no atrial flutter.  PLAN: Continue to monitor for  signs of CHF exacerbation. Unlikely to be due to CAD, but will plan outpatient Lexiscan Myoview (avoiding contrast based coronary angio). Have not yet started guideline directed HF meds due to acute renal failure. May need right heart cath? (after COVID cleared).   Sanda Klein, MD, [place] [phone] 03/22/2020, 2:10 PM

## 2020-03-23 ENCOUNTER — Telehealth: Payer: Self-pay | Admitting: Internal Medicine

## 2020-03-23 ENCOUNTER — Inpatient Hospital Stay: Payer: Self-pay

## 2020-03-23 DIAGNOSIS — Z515 Encounter for palliative care: Secondary | ICD-10-CM

## 2020-03-23 DIAGNOSIS — Z7189 Other specified counseling: Secondary | ICD-10-CM

## 2020-03-23 DIAGNOSIS — N1832 Chronic kidney disease, stage 3b: Secondary | ICD-10-CM

## 2020-03-23 LAB — BASIC METABOLIC PANEL
Anion gap: 13 (ref 5–15)
BUN: 65 mg/dL — ABNORMAL HIGH (ref 8–23)
CO2: 27 mmol/L (ref 22–32)
Calcium: 7.9 mg/dL — ABNORMAL LOW (ref 8.9–10.3)
Chloride: 95 mmol/L — ABNORMAL LOW (ref 98–111)
Creatinine, Ser: 4.22 mg/dL — ABNORMAL HIGH (ref 0.44–1.00)
GFR, Estimated: 11 mL/min — ABNORMAL LOW (ref >60)
Glucose, Bld: 93 mg/dL (ref 70–99)
Potassium: 3.3 mmol/L — ABNORMAL LOW (ref 3.5–5.1)
Sodium: 135 mmol/L (ref 135–145)

## 2020-03-23 LAB — CBC
HCT: 30.2 % — ABNORMAL LOW (ref 36.0–46.0)
Hemoglobin: 10.1 g/dL — ABNORMAL LOW (ref 12.0–15.0)
MCH: 31.7 pg (ref 26.0–34.0)
MCHC: 33.4 g/dL (ref 30.0–36.0)
MCV: 94.7 fL (ref 80.0–100.0)
Platelets: 42 10*3/uL — ABNORMAL LOW (ref 150–400)
RBC: 3.19 MIL/uL — ABNORMAL LOW (ref 3.87–5.11)
RDW: 16.9 % — ABNORMAL HIGH (ref 11.5–15.5)
WBC: 3.7 10*3/uL — ABNORMAL LOW (ref 4.0–10.5)
nRBC: 0.5 % — ABNORMAL HIGH (ref 0.0–0.2)

## 2020-03-23 MED ORDER — HYDRALAZINE HCL 50 MG PO TABS
50.0000 mg | ORAL_TABLET | Freq: Three times a day (TID) | ORAL | Status: DC
  Administered 2020-03-23 – 2020-03-24 (×3): 50 mg via ORAL
  Filled 2020-03-23 (×3): qty 1

## 2020-03-23 NOTE — Progress Notes (Signed)
PROGRESS NOTE    Debbie Chan  BUL:[id number] DOB: Jan 06, 1951 DOA: 03/18/2020 PCP: [name], MD     Brief Narrative:  Debbie Chan is a 70 year old female with past medical history significant for multiple myeloma, atrial flutter, chronic anemia who presented to the hospital with complaints of weakness.  She was found to have severe thrombocytopenia and AKI.  She was given IV fluid, platelet transfusions.  She was also incidentally found to have COVID-19.  New events last 24 hours / Subjective: Patient sitting at the side of the bed.  She has no physical complaints on examination today.  Assessment & Plan:   Active Problems:   Anemia   AKI (acute kidney injury) ([place])   Atrial flutter (HCC)   Multiple myeloma without remission (HCC)   Thrombocytopenia (HCC)   Colitis   Hematuria   Polycystic kidney   Hyponatremia   Dehydration   AKI on CKD stage IIIb -Baseline creatinine 1.2 -Renal ultrasound without sign of obstruction -Could be secondary to dehydration, myeloma kidney, heart failure -Nephrology following -Continue sodium bicarb fluids -Slight improvement in creatinine this morning 4.22, continue to monitor closely  Acute systolic heart failure -BNP 1809.1 --> 594.1 -Suspect cardiomyopathy could be secondary to cardiotoxic effects from carfilzomib  -Cardiology following  COVID-19 -Incidental finding.  On room air.  CRP 0.9  Thrombocytopenia -Presented with platelet 8000 on admission.  Transfused 1 unit platelets -Hematology/oncology following.  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  Transfuse for platelets <20,000 for acute bleeding -Follow CBC  Acute colitis -GI PCR negative -CT abdomen pelvis reveals colitis of the distal descending and sigmoid colon -Zosyn --> Augmentin  Multiple myeloma -Follows with [name]-Completed course of Decadron  Paroxysmal atrial flutter -CHA2DS2-VASc 4 -Anticoagulation  currently on hold due to thrombocytopenia  Hypertension -Continue hydralazine, Imdur  Polycystic morphology kidneys -Follow-up outpatient    DVT prophylaxis:  SCDs Start: 03/19/20 1051  Code Status: Full code Family Communication: None at bedside Disposition Plan:  Status is: Inpatient  Remains inpatient appropriate because:IV treatments appropriate due to intensity of illness or inability to take PO and Inpatient level of care appropriate due to severity of illness   Dispo: The patient is from: Home              Anticipated d/c is to: Home              Anticipated d/c date is: 2 days              Patient currently is not medically stable to d/c.   Difficult to place patient No   Antimicrobials:  Anti-infectives (From admission, onward)   Start     Dose/Rate Route Frequency Ordered Stop   03/21/20 2200  amoxicillin-clavulanate (AUGMENTIN) 500-125 MG per tablet 500 mg        1 tablet Oral 2 times daily 03/21/20 1527 03/26/20 0959   03/19/20 2200  piperacillin-tazobactam (ZOSYN) IVPB 3.375 g  Status:  Discontinued        3.375 g 12.5 mL/hr over 240 Minutes Intravenous Every 12 hours 03/19/20 1020 03/21/20 1527   03/19/20 2200  acyclovir (ZOVIRAX) 200 MG capsule 200 mg        200 mg Oral 2 times daily 03/19/20 1611     03/19/20 0300  piperacillin-tazobactam (ZOSYN) IVPB 2.25 g        2.25 g 100 mL/hr over 30 Minutes Intravenous Every 8 hours 03/19/20 0228 03/19/20 1643  Objective: Vitals:   03/22/20 1317 03/22/20 2015 03/23/20 0326 03/23/20 0500  BP: (!) 160/85 (!) 178/90 (!) 183/96   Pulse: 87 94 86   Resp: 19 (!) 21 18   Temp: 98.4 F (36.9 C) 98.4 F (36.9 C) 98.5 F (36.9 C)   TempSrc: Oral Oral Oral   SpO2: 100% 96% 97%   Weight:    92.5 kg  Height:        Intake/Output Summary (Last 24 hours) at 03/23/2020 1145 Last data filed at 03/23/2020 0941 Gross per 24 hour  Intake 3433.55 ml  Output 1850 ml  Net 1583.55 ml   Filed Weights   03/21/20  0500 03/22/20 0512 03/23/20 0500  Weight: 87.4 kg 87.3 kg 92.5 kg    Examination: General exam: Appears calm and comfortable  Respiratory system: Clear to auscultation. Respiratory effort normal.  On room air Cardiovascular system: S1 & S2 heard, RRR.  Trace pretibial edema. Gastrointestinal system: Abdomen is nondistended, soft and nontender. Normal bowel sounds heard. Central nervous system: Alert and oriented. Non focal exam. Speech clear  Extremities: Symmetric in appearance bilaterally  Skin: No rashes, lesions or ulcers on exposed skin  Psychiatry: Judgement and insight appear stable. Mood & affect appropriate.   Data Reviewed: I have personally reviewed following labs and imaging studies  CBC: Recent Labs  Lab 03/18/20 1747 03/19/20 0624 03/19/20 1118 03/20/20 0414 03/21/20 0500 03/22/20 0503 03/23/20 0650  WBC 7.4 9.2 11.0* 12.9* 9.0 5.8 3.7*  NEUTROABS 6.0 8.4* 10.1*  --   --   --   --   HGB 11.8* 11.9* 10.6* 10.4* 9.9* 9.7* 10.1*  HCT 35.4* 35.3* 31.9* 31.0* 28.8* 28.8* 30.2*  MCV 95.2 95.7 94.9 95.1 93.8 94.7 94.7  PLT 8* 44* 38* 35* 35* 39* 42*   Basic Metabolic Panel: Recent Labs  Lab 03/19/20 0624 03/19/20 1118 03/19/20 1544 03/20/20 0414 03/21/20 0500 03/22/20 0503 03/23/20 0650  NA  --  130* 130* 130* 131* 136 135  K  --  4.5 4.7 4.7 4.0 3.6 3.3*  CL  --  96* 99 100 95* 96* 95*  CO2  --  16* 17* 16* _0 GLUCOSE  --  165* 140* 127* 100* 87 93  BUN  --  62* 60* 71* 76* 70* 65*  CREATININE  --  3.87* 4.14* 4.43* 4.42* 4.29* 4.22*  CALCIUM  --  8.1* 8.2* 8.4* 8.1* 8.2* 7.9*  MG 2.1 2.2  --   --   --   --   --   PHOS 4.2 4.8*  --   --   --   --   --    GFR: Estimated Creatinine Clearance: 14.7 mL/min (A) (by C-G formula based on SCr of 4.22 mg/dL (H)). Liver Function Tests: Recent Labs  Lab 03/19/20 1118 03/20/20 0414  AST 21 17  ALT 12 11  ALKPHOS 53 47  BILITOT 1.0 0.9  PROT 5.0* 4.9*  ALBUMIN 3.1* 2.9*   No results for input(s):  LIPASE, AMYLASE in the last 168 hours. No results for input(s): AMMONIA in the last 168 hours. Coagulation Profile: No results for input(s): INR, PROTIME in the last 168 hours. Cardiac Enzymes: Recent Labs  Lab 03/19/20 0624  CKTOTAL 36*   BNP (last 3 results) No results for input(s): PROBNP in the last 8760 hours. HbA1C: No results for input(s): HGBA1C in the last 72 hours. CBG: No results for input(s): GLUCAP in the last 168 hours. Lipid Profile: No results  for input(s): CHOL, HDL, LDLCALC, TRIG, CHOLHDL, LDLDIRECT in the last 72 hours. Thyroid Function Tests: No results for input(s): TSH, T4TOTAL, FREET4, T3FREE, THYROIDAB in the last 72 hours. Anemia Panel: No results for input(s): VITAMINB12, FOLATE, FERRITIN, TIBC, IRON, RETICCTPCT in the last 72 hours. Sepsis Labs: Recent Labs  Lab 03/19/20 6333  LATICACIDVEN 1.7    Recent Results (from the past 240 hour(s))  Urine culture     Status: Abnormal   Collection Time: 03/18/20  6:57 PM   Specimen: Urine, Random  Result Value Ref Range Status   Specimen Description   Final    URINE, RANDOM Performed at [facility] [address], [place], [place] [address]    Special Requests   Final    NONE Performed at [facility], [place] [address] [place], [place] [address]    Culture (A)  Final    <10,000 COLONIES/mL INSIGNIFICANT GROWTH Performed at [facility] [address] [place], [place] [address]    Report Status 03/20/2020 FINAL  Final  SARS CORONAVIRUS 2 (TAT 6-24 HRS) Nasopharyngeal Nasopharyngeal Swab     Status: Abnormal   Collection Time: 03/19/20  2:04 AM   Specimen: Nasopharyngeal Swab  Result Value Ref Range Status   SARS Coronavirus 2 POSITIVE (A) NEGATIVE Final    Comment: (NOTE) SARS-CoV-2 target nucleic acids are DETECTED.  The SARS-CoV-2 RNA is generally detectable in upper and lower respiratory specimens during the acute phase of infection.  Positive results are indicative of the presence of SARS-CoV-2 RNA. Clinical correlation with patient history and other diagnostic information is  necessary to determine patient infection status. Positive results do not rule out bacterial infection or co-infection with other viruses.  The expected result is Negative.  Fact Sheet for Patients: SugarRoll.be  Fact Sheet for Healthcare Providers: https://www.woods-mathews.com/  This test is not yet approved or cleared by the [place] FDA and  has been authorized for detection and/or diagnosis of SARS-CoV-2 by FDA under an Emergency Use Authorization (EUA). This EUA will remain  in effect (meaning this test can be used) for the duration of the COVID-19 declaration under Section 564(b)(1) of the Act, 21 U. S.C. section 360bbb-3(b)(1), unless the authorization is terminated or revoked sooner.   Performed at [facility], [place] [address], [place], [place] [address]   Stool culture (children & immunocomp patients)     Status: None   Collection Time: 03/19/20 10:52 AM   Specimen: Perirectal; Stool  Result Value Ref Range Status   Salmonella/Shigella Screen NSTCUL  Final    Comment: (NOTE) Test not performed. No stool culture transport device received.      Emani R. was notified 03/20/2020.    Campylobacter Culture NSTCUL  Final    Comment: (NOTE) Test not performed. No stool culture transport device received.      Emani R. was notified 03/20/2020.    E coli, Shiga toxin Assay NSTCUL  Final    Comment: (NOTE) Test not performed. No stool culture transport device received.      Emani R. was notified 03/20/2020. Performed At: [facility] [place], [place] [id number] Rush Farmer MD [id number]   Gastrointestinal Panel by PCR , Stool     Status: None   Collection Time: 03/19/20 10:52 AM   Specimen: Perirectal; Stool  Result Value Ref Range Status    Campylobacter species NOT DETECTED NOT DETECTED Final   Plesimonas shigelloides NOT DETECTED NOT DETECTED Final  Salmonella species NOT DETECTED NOT DETECTED Final   Yersinia enterocolitica NOT DETECTED NOT DETECTED Final   Vibrio species NOT DETECTED NOT DETECTED Final   Vibrio cholerae NOT DETECTED NOT DETECTED Final   Enteroaggregative E coli (EAEC) NOT DETECTED NOT DETECTED Final   Enteropathogenic E coli (EPEC) NOT DETECTED NOT DETECTED Final   Enterotoxigenic E coli (ETEC) NOT DETECTED NOT DETECTED Final   Shiga like toxin producing E coli (STEC) NOT DETECTED NOT DETECTED Final   Shigella/Enteroinvasive E coli (EIEC) NOT DETECTED NOT DETECTED Final   Cryptosporidium NOT DETECTED NOT DETECTED Final   Cyclospora cayetanensis NOT DETECTED NOT DETECTED Final   Entamoeba histolytica NOT DETECTED NOT DETECTED Final   Giardia lamblia NOT DETECTED NOT DETECTED Final   Adenovirus F40/41 NOT DETECTED NOT DETECTED Final   Astrovirus NOT DETECTED NOT DETECTED Final   Norovirus GI/GII NOT DETECTED NOT DETECTED Final   Rotavirus A NOT DETECTED NOT DETECTED Final   Sapovirus (I, II, IV, and V) NOT DETECTED NOT DETECTED Final    Comment: Performed at [facility], [address], [place], [place] [address]      Radiology Studies: DG Chest Port 1 View  Result Date: 03/21/2020 CLINICAL DATA:  Cough.  COVID-19 positive EXAM: PORTABLE CHEST 1 VIEW COMPARISON:  March 19, 2020 FINDINGS: Minimal left pleural effusion. No edema or airspace opacity. There is cardiomegaly with pulmonary vascularity normal. No adenopathy. Port-A-Cath tip is in the superior vena cava near the cavoatrial junction. Bones osteoporotic. IMPRESSION: Minimal left pleural effusion. No edema or airspace opacity. Stable cardiomegaly. Osteoporosis. Stable Port-A-Cath position. Electronically Signed   By: Lowella Grip III M.D.   On: 03/21/2020 13:30      Scheduled Meds: . acyclovir  200 mg Oral BID  .  amoxicillin-clavulanate  1 tablet Oral BID  . Chlorhexidine Gluconate Cloth  6 each Topical Daily  . hydrALAZINE  50 mg Oral Q8H  . isosorbide mononitrate  30 mg Oral BID  . loratadine  10 mg Oral Daily  . sodium chloride flush  10-40 mL Intracatheter Q12H   Continuous Infusions:    LOS: 5 days      Time spent: 25 minutes   Dessa Phi, DO [facility] 03/23/2020, 11:45 AM   Available via Epic secure chat 7am-7pm After these hours, please refer to coverage provider listed on [email or web address]

## 2020-03-23 NOTE — Plan of Care (Signed)
  Problem: Education: Goal: Knowledge of risk factors and measures for prevention of condition will improve Outcome: Progressing   Problem: Coping: Goal: Psychosocial and spiritual needs will be supported Outcome: Progressing   Problem: Respiratory: Goal: Will maintain a patent airway Outcome: Progressing Goal: Complications related to the disease process, condition or treatment will be avoided or minimized Outcome: Progressing   

## 2020-03-23 NOTE — Progress Notes (Signed)
Progress Note  Patient Name: Debbie Chan Date of Encounter: 03/23/2020  [facility] Cardiologist: Glori Bickers, MD   Subjective   Denies dyspnea and cough has not worsened.  Developed some pretibial edema. Net+ve 1 liter/24h. Reportedly weight up 10 lb, must be an error.  Inpatient Medications    Scheduled Meds: . acyclovir  200 mg Oral BID  . amoxicillin-clavulanate  1 tablet Oral BID  . Chlorhexidine Gluconate Cloth  6 each Topical Daily  . hydrALAZINE  50 mg Oral Q8H  . isosorbide mononitrate  30 mg Oral BID  . loratadine  10 mg Oral Daily  . sodium chloride flush  10-40 mL Intracatheter Q12H   Continuous Infusions:  PRN Meds: acetaminophen **OR** acetaminophen, hydrALAZINE, HYDROcodone-acetaminophen, melatonin, sodium chloride flush   Vital Signs    Vitals:   03/22/20 1317 03/22/20 2015 03/23/20 0326 03/23/20 0500  BP: (!) 160/85 (!) 178/90 (!) 183/96   Pulse: 87 94 86   Resp: 19 (!) 21 18   Temp: 98.4 F (36.9 C) 98.4 F (36.9 C) 98.5 F (36.9 C)   TempSrc: Oral Oral Oral   SpO2: 100% 96% 97%   Weight:    92.5 kg  Height:        Intake/Output Summary (Last 24 hours) at 03/23/2020 0848 Last data filed at 03/23/2020 0549 Gross per 24 hour  Intake 2951.31 ml  Output 1850 ml  Net 1101.31 ml   Last 3 Weights 03/23/2020 03/22/2020 03/21/2020  Weight (lbs) 203 lb 14.8 oz 192 lb 7.4 oz 192 lb 9.6 oz  Weight (kg) 92.5 kg 87.3 kg 87.363 kg      Telemetry    NSR. Occ PVCs - Personally Reviewed  ECG    No new tracing - Personally Reviewed  Physical Exam  Appears comfortable GEN: No acute distress.   Neck: No JVD (JVP right at head of clavicle) Cardiac: RRR, 2/6 holosystolic murmur at left lower sternal border, no diastolic murmurs, rubs, or gallops.  Respiratory: Clear to auscultation bilaterally. GI: Soft, nontender, non-distended  MS:  2+ symmetrical pretibial edema; No deformity. Neuro:  Nonfocal  Psych: Normal affect   Labs    High  Sensitivity Troponin:  No results for input(s): TROPONINIHS in the last 720 hours.    Chemistry Recent Labs  Lab 03/19/20 1118 03/19/20 1544 03/20/20 0414 03/21/20 0500 03/22/20 0503 03/23/20 0650  NA 130*   < > 130* 131* 136 135  K 4.5   < > 4.7 4.0 3.6 3.3*  CL 96*   < > 100 95* 96* 95*  CO2 16*   < > 16* _0 GLUCOSE 165*   < > 127* 100* 87 93  BUN 62*   < > 71* 76* 70* 65*  CREATININE 3.87*   < > 4.43* 4.42* 4.29* 4.22*  CALCIUM 8.1*   < > 8.4* 8.1* 8.2* 7.9*  PROT 5.0*  --  4.9*  --   --   --   ALBUMIN 3.1*  --  2.9*  --   --   --   AST 21  --  17  --   --   --   ALT 12  --  11  --   --   --   ALKPHOS 53  --  47  --   --   --   BILITOT 1.0  --  0.9  --   --   --   GFRNONAA 12*   < > 10* 10*  11* 11*  ANIONGAP 18*   < > _0 < > = values in this interval not displayed.     Hematology Recent Labs  Lab 03/21/20 0500 03/22/20 0503 03/23/20 0650  WBC 9.0 5.8 3.7*  RBC 3.07* 3.04* 3.19*  HGB 9.9* 9.7* 10.1*  HCT 28.8* 28.8* 30.2*  MCV 93.8 94.7 94.7  MCH 32.2 31.9 31.7  MCHC 34.4 33.7 33.4  RDW 15.9* 16.6* 16.9*  PLT 35* 39* 42*    BNP Recent Labs  Lab 03/19/20 1715 03/21/20 1235  BNP 1,809.1* 594.1*     DDimer  Recent Labs  Lab 03/19/20 0100  DDIMER 1.36*     Radiology    DG Chest Port 1 View  Result Date: 03/21/2020 CLINICAL DATA:  Cough.  COVID-19 positive EXAM: PORTABLE CHEST 1 VIEW COMPARISON:  March 19, 2020 FINDINGS: Minimal left pleural effusion. No edema or airspace opacity. There is cardiomegaly with pulmonary vascularity normal. No adenopathy. Port-A-Cath tip is in the superior vena cava near the cavoatrial junction. Bones osteoporotic. IMPRESSION: Minimal left pleural effusion. No edema or airspace opacity. Stable cardiomegaly. Osteoporosis. Stable Port-A-Cath position. Electronically Signed   By: Lowella Grip III M.D.   On: 03/21/2020 13:30    Cardiac Studies   03/19/20 echo  1. Left ventricular ejection  fraction, by estimation, is 25 to 30%. The  left ventricle has severely decreased function. The left ventricle  demonstrates global hypokinesis although the septal LV segments appear  slightly worse. Left ventricular diastolic  parameters are consistent with Grade II diastolic dysfunction  (pseudonormalization).  2. Right ventricular systolic function is mildly reduced. The right  ventricular size is moderately enlarged. There is moderately elevated  pulmonary artery systolic pressure.  3. The mitral valve is grossly normal. Mild mitral valve regurgitation.  4. Right atrial size was massively dilated.  5. Tricuspid valve regurgitation is moderate to severe.  6. The aortic valve is tricuspid. There is mild calcification of the  aortic valve. There is mild thickening of the aortic valve. Aortic valve  regurgitation is mild.  7. Aortic dilatation noted. 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  8. The inferior vena cava is normal in size with greater than 50%  respiratory variability, suggesting right atrial pressure of 3 mmHg.   Patient Profile     70 y.o. female with multiple myeloma not in remission, status post 11 cycles of carfilzomib and cyclophosphamide, history of paroxysmal atrial flutter 2019, history of severe tricuspid regurgitation and right heart enlargement, admitted for suspected infectious colitis, acute renal insufficiency and severe thrombocytopenia and found to have severely depressed left ventricular systolic function, new compared to 2019.  Assessment & Plan    1. CHF: So far tolerating IV hydration for renal failure. Yesterday, repeat BNP decreased to 594 from 1809 and chest x-ray with minimal lt pl effusion no edema or airspace opacity stable cardiomegaly.  Cause of cardiomyopathy is unclear, but the leading suspect is cardiotoxic effect from carfilzomib chemotherapy.  Symptoms of HF seem to precede the COVID infection, no other  features suggest acute myocarditis. Haptoglobin low, raising possibility of thrombotic microangiopathy, but no schistocytes. Unlikely to be due to CAD, but will plan outpatient Lexiscan Myoview (avoiding contrast based coronary angio). Consider right heart cath after her COVID isolation is completed (February 4 by my calculation). 2.Chronic right heart failure:Severe tricuspid regurgitation, preserved right ventricular systolic function, massively dilated right atrium present since 2019 suggest chronic etiology,  mechanism unclear. At this point without overt findings of increased right heart filling pressures, no edema. 3.History of paroxysmal atrial flutter:Not during this admission so far. Anticoagulation currently on hold due to markedly worsened renal function. Intermediate to high embolic risk with a [id number] score of 4. 4. PTW:SFKCLEXN hydralazine/nitrates in view of abnormal renal function. Avoid RAAS inhibitors. I am also concerned that she is at risk for decompensation and will avoid beta-blockers at this time, although ideally these will be started before discharge. 5.IgAMM:Unfortunately, concerned that the current chemotherapy is causing her cardiac decompensation. She has failed Velcade and had allergic response to Pomylast. We will have to discuss alternatives with her oncologist. 6. Ac on TZG:YFVCBSWH to show very subtle signs of improvement. Kidney ultrasound suggest chronic medical disease with cortical thinning. However, the worsening of her renal function has been relatively abrupt for 1.2 as recently as January 19.Consider acute tubular necrosis from hypovolemia from the colitis, myeloma kidney, thrombotic microangiopathy. 7. COVID-19:Probably the cause of her gastroenteritis. No respiratory manifestations. Her cough precedes the current hospitalization and Covid infection by several weeks and might have been a sign of fluid overload/heart failure.     For  questions or updates, please contact [place] Please consult [email or web address] for contact info under        Signed, Sanda Klein, MD  03/23/2020, 8:48 AM

## 2020-03-23 NOTE — Telephone Encounter (Signed)
Attempted to reschedule upcoming appointment due to provider's lunch. Patient is currently hospitalized in isolation and is unsure if she will be discharged before the appointments. Cancelled appointments and messaged provider for further instructions.

## 2020-03-23 NOTE — Progress Notes (Signed)
[facility] Progress Note  Subjective:  Patient not examined today directly given COVID-19 + status, utilizing data taken from chart +/- discussions w/ providers and staff.    Vitals:   03/22/20 2015 03/23/20 0326 03/23/20 0500 03/23/20 1337  BP: (!) 178/90 (!) 183/96  (!) 167/88  Pulse: 94 86  100  Resp: (!) 21 18  (!) 22  Temp: 98.4 F (36.9 C) 98.5 F (36.9 C)  (!) 97.3 F (36.3 C)  TempSrc: Oral Oral  Oral  SpO2: 96% 97%  98%  Weight:   92.5 kg   Height:        Exam:  Patient not examined today directly given COVID-19 + status, utilizing data taken from chart +/- discussions w/ providers and staff.       Home meds:  - lasix 40 qd/ metoprolol 25 bid  - prilosec 20/ decadron 71m q 7d/ zovirax 200 bid  - chemoRx = Cytoxan, decadron, carfilzomib     UA 11-20 rbc, 0-5 wbc, rare bact, > 300 prot    UNa < 10, UCr 143    Renal [place]/25 -  IMPRESSION: No hydronephrosis. Numerous bilateral renal cysts. Increased echogenicity likely reflecting chronic medical renal disease.      ECHO 1/24 - LVEF 25-30%      CXR - IMPRESSION: Shallow inspiration with probable small left and trace right pleural effusions. No significant interval change since the prior Radiograph       CT abd - Adrenals/Urinary Tract: Polycystic morphology of the kidneys with replacement of the majority of the renal parenchyma by cysts. Evaluation of the renal cyst is limited on this noncontrast CT. There is no hydronephrosis or nephrolithiasis on either side. The urinary bladder is grossly unremarkable.         Alb 2.9  Ca 8.4 corr Ca 9.2   Tbili 1.0  Phos 4.8   WBC 11K plt 38k  Hb 10.6                      Date                Creat               eGFR         2019- 01/2020     1.15- 1.45        32- 50 ml/min          Jan 7                   1.17                 51, IIIa         Mar 14, 2020       1.24                 47, IIIa         Jan 23                 3.31                 15         Jan 24                  3.52         Jan 25                 4.43  10  Assessment/ Plan: 1. AKI on CKD 3b - b/l creat 1.1- 1.4 in Dec 2021, eGFR 32- 50. Creat 3.3 on admit, then 4.4 > 4.2 yest and 4.2 today.  UA w/ rbc's and proteinuria. UPC ratio is 1.8. Renal US w/o signs of obstruction. UA w/ low Na. Haptoglobin low. Possible causes of AKI including dehydration, myeloma kidney, TMA d/t chemo, heart failure.  Gave pt IVF"s x 2 days w/ decline in creat to 4.2. +new mild pretib edema. Wt's up 5kg , not sure accuracy. Hold IVF"s for now, pt eating. No indication for RRT at this time. Spoke w/ pt / family yesterday. Will follow.  2. COVID infection - asymptomatic, per pmd 3. Acute syst CHF - LVEF 25-30%, no vol overload on exam 4. Multiple myeloma - dx'd in 2019, w/o remission - per ONC, on cytoxan/ carfilzomib/ steroids, on hold for now.  5. Thrombocytopenia - plts stable 30- 50 range. Chronic issue as well 6. Prognosis - guarded  7. Anemia - Hb 10's 8. HTN - getting norvasc, BP's slightly up      Rob Jahlon Baines 03/23/2020, 2:07 PM   Recent Labs  Lab 03/19/20 0624 03/19/20 1118 03/19/20 1544 03/22/20 0503 03/23/20 0650  K  --  4.5   < > 3.6 3.3*  BUN  --  62*   < > 70* 65*  CREATININE  --  3.87*   < > 4.29* 4.22*  CALCIUM  --  8.1*   < > 8.2* 7.9*  PHOS 4.2 4.8*  --   --   --   HGB 11.9* 10.6*   < > 9.7* 10.1*   < > = values in this interval not displayed.   Inpatient medications: . acyclovir  200 mg Oral BID  . amoxicillin-clavulanate  1 tablet Oral BID  . Chlorhexidine Gluconate Cloth  6 each Topical Daily  . hydrALAZINE  50 mg Oral Q8H  . isosorbide mononitrate  30 mg Oral BID  . loratadine  10 mg Oral Daily  . sodium chloride flush  10-40 mL Intracatheter Q12H    acetaminophen **OR** acetaminophen, hydrALAZINE, HYDROcodone-acetaminophen, melatonin, sodium chloride flush

## 2020-03-24 LAB — CBC
HCT: 29 % — ABNORMAL LOW (ref 36.0–46.0)
Hemoglobin: 9.8 g/dL — ABNORMAL LOW (ref 12.0–15.0)
MCH: 32.2 pg (ref 26.0–34.0)
MCHC: 33.8 g/dL (ref 30.0–36.0)
MCV: 95.4 fL (ref 80.0–100.0)
Platelets: 40 10*3/uL — ABNORMAL LOW (ref 150–400)
RBC: 3.04 MIL/uL — ABNORMAL LOW (ref 3.87–5.11)
RDW: 17 % — ABNORMAL HIGH (ref 11.5–15.5)
WBC: 2.9 10*3/uL — ABNORMAL LOW (ref 4.0–10.5)
nRBC: 0 % (ref 0.0–0.2)

## 2020-03-24 LAB — BASIC METABOLIC PANEL
Anion gap: 12 (ref 5–15)
BUN: 69 mg/dL — ABNORMAL HIGH (ref 8–23)
CO2: 26 mmol/L (ref 22–32)
Calcium: 8 mg/dL — ABNORMAL LOW (ref 8.9–10.3)
Chloride: 98 mmol/L (ref 98–111)
Creatinine, Ser: 4.36 mg/dL — ABNORMAL HIGH (ref 0.44–1.00)
GFR, Estimated: 10 mL/min — ABNORMAL LOW (ref >60)
Glucose, Bld: 80 mg/dL (ref 70–99)
Potassium: 3.6 mmol/L (ref 3.5–5.1)
Sodium: 136 mmol/L (ref 135–145)

## 2020-03-24 MED ORDER — HYDRALAZINE HCL 50 MG PO TABS
75.0000 mg | ORAL_TABLET | Freq: Three times a day (TID) | ORAL | Status: DC
  Administered 2020-03-24 – 2020-03-25 (×3): 75 mg via ORAL
  Filled 2020-03-24 (×3): qty 1

## 2020-03-24 MED ORDER — SODIUM CHLORIDE 0.9 % IV SOLN: INTRAVENOUS | Status: DC

## 2020-03-24 MED ORDER — CARVEDILOL 3.125 MG PO TABS
3.1250 mg | ORAL_TABLET | Freq: Two times a day (BID) | ORAL | Status: DC
  Administered 2020-03-24 – 2020-03-25 (×3): 3.125 mg via ORAL
  Filled 2020-03-24 (×3): qty 1

## 2020-03-24 MED ORDER — SALINE SPRAY 0.65 % NA SOLN
1.0000 | NASAL | Status: DC | PRN
  Administered 2020-03-24: 1 via NASAL
  Filled 2020-03-24: qty 44

## 2020-03-24 MED ORDER — AMLODIPINE BESYLATE 5 MG PO TABS
2.5000 mg | ORAL_TABLET | Freq: Every day | ORAL | Status: DC
  Administered 2020-03-24 – 2020-04-04 (×12): 2.5 mg via ORAL
  Filled 2020-03-24 (×12): qty 1

## 2020-03-24 NOTE — Plan of Care (Signed)
  Problem: Education: Goal: Knowledge of risk factors and measures for prevention of condition will improve Outcome: Progressing   Problem: Coping: Goal: Psychosocial and spiritual needs will be supported Outcome: Progressing   Problem: Respiratory: Goal: Will maintain a patent airway Outcome: Progressing Goal: Complications related to the disease process, condition or treatment will be avoided or minimized Outcome: Progressing   

## 2020-03-24 NOTE — Progress Notes (Signed)
[facility] Progress Note  Subjective:  Pt seen in room.     Vitals:   03/24/20 0427 03/24/20 0432 03/24/20 0744 03/24/20 1341  BP: (!) 171/100  (!) 174/88 (!) 152/73  Pulse: 94   (!) 47  Resp: 16   18  Temp: 98.6 F (37 C)   98.8 F (37.1 C)  TempSrc: Oral   Oral  SpO2: 95%   95%  Weight:  88.4 kg    Height:        Exam:   alert, nad  no jvd Chest cta bilat Cor reg no RG Abd soft ntnd no ascites Ext 1+ pretib  edema Alert, NF, ox3, no asterixis     Home meds:  - lasix 40 qd/ metoprolol 25 bid  - prilosec 20/ decadron 62m q 7d/ zovirax 200 bid  - chemoRx = Cytoxan, decadron, carfilzomib     UA 11-20 rbc, 0-5 wbc, rare bact, > 300 prot    UNa < 10, UCr 143    Renal [place]/25 -  IMPRESSION: No hydronephrosis. Numerous bilateral renal cysts. Increased echogenicity likely reflecting chronic medical renal disease.      ECHO 1/24 - LVEF 25-30%      CXR - IMPRESSION: Shallow inspiration with probable small left and trace right pleural effusions. No significant interval change since the prior Radiograph       CT abd - Adrenals/Urinary Tract: Polycystic morphology of the kidneys with replacement of the majority of the renal parenchyma by cysts. Evaluation of the renal cyst is limited on this noncontrast CT. There is no hydronephrosis or nephrolithiasis on either side. The urinary bladder is grossly unremarkable.                      Date                Creat               eGFR         2019- 01/2020     1.15- 1.45        32- 50 ml/min          Jan 7                   1.17                 51, IIIa         Mar 14, 2020       1.24                 47, IIIa         Jan 23                 3.31                 15         Jan 24                 3.52         Jan 25                 4.43                 10  Assessment/ Plan: 1. AKI on CKD 3b - b/l creat 1.1- 1.4 in Dec 2021, eGFR 32- 50. Creat 3.3 on admit, peaked at 4.4.  UA w/ rbc's and proteinuria. UPC  ratio is  1.8. Renal US w/o signs of obstruction. UA w/ low Na. Haptoglobin low. Likely causes of AKI include dehydration, myeloma kidney, TMA d/t chemo.  Gave pt IVF"s x 2-3 days w/ partial improvement, now Creat stuck low 4's. Making urine, no signs of uremia, no indication for RRT yet. Will resume IVF at 50/hr. Will follow.  2. COVID infection - asymptomatic, per pmd 3. Acute syst CHF - LVEF 25-30%, no vol overload on exam 4. Multiple myeloma - dx'd in 2019, w/o remission - was getting on cytoxan/ carfilzomib/ steroids, on hold for now.  5. Thrombocytopenia - acute on chrnonic, plts stable 30- 50 range. Chronic issue as well. Per Hem-Onc.  6. Anemia - Hb 10's 7. HTN - getting norvasc, BP's slightly up 8. Dispo - given stable creat and no uremic signs, pt could be dc'd from renal standpoint and followed up in the office.     Kelly Splinter, MD 03/24/2020, 2:38 PM    Recent Labs  Lab 03/19/20 9791 03/19/20 1118 03/19/20 1544 03/23/20 0650 03/24/20 0349  K  --  4.5   < > 3.3* 3.6  BUN  --  62*   < > 65* 69*  CREATININE  --  3.87*   < > 4.22* 4.36*  CALCIUM  --  8.1*   < > 7.9* 8.0*  PHOS 4.2 4.8*  --   --   --   HGB 11.9* 10.6*   < > 10.1* 9.8*   < > = values in this interval not displayed.   Inpatient medications: . acyclovir  200 mg Oral BID  . amLODipine  2.5 mg Oral Daily  . carvedilol  3.125 mg Oral BID WC  . Chlorhexidine Gluconate Cloth  6 each Topical Daily  . hydrALAZINE  75 mg Oral Q8H  . isosorbide mononitrate  30 mg Oral BID  . loratadine  10 mg Oral Daily  . sodium chloride flush  10-40 mL Intracatheter Q12H   . sodium chloride 50 mL/hr at 03/24/20 0840   acetaminophen **OR** acetaminophen, hydrALAZINE, HYDROcodone-acetaminophen, melatonin, sodium chloride, sodium chloride flush

## 2020-03-24 NOTE — Progress Notes (Addendum)
PROGRESS NOTE    Debbie Chan  CMK:[id number] DOB: 1950/11/08 DOA: 03/18/2020 PCP: [name], MD     Brief Narrative:  Debbie Chan is a 70 year old female with past medical history significant for multiple myeloma, atrial flutter, chronic anemia who presented to the hospital with complaints of weakness.  She was found to have severe thrombocytopenia and AKI.  She was given IV fluid, platelet transfusions.  She was also incidentally found to have COVID-19. Hospitalization complicated by renal failure as well as heart failure.  New events last 24 hours / Subjective: Patient sitting in chair, just finished eating breakfast. She has no complaints of shortness of breath. No GI complaints.  Assessment & Plan:   Active Problems:   Anemia   AKI (acute kidney injury) ([place])   Atrial flutter (HCC)   Multiple myeloma without remission (HCC)   Thrombocytopenia (HCC)   Colitis   Hematuria   Polycystic kidney   Hyponatremia   Dehydration   AKI on CKD stage IIIb -Baseline creatinine 1.2 -Renal ultrasound without sign of obstruction -Could be secondary to dehydration, myeloma kidney, heart failure -Nephrology following -Has had no meaningful improvement, been 4.1-4.4 range for the past 5 days. Per nephrology yesterday, no acute indication for RRT yet. Patient resumed on IV fluid today  Acute systolic heart failure -BNP 1809.1 --> 594.1 -Suspect cardiomyopathy could be secondary to cardiotoxic effects from carfilzomib  -Cardiology following -Added Coreg today  COVID-19 -Incidental finding, tested positive 1/24.  On room air.  CRP 0.9. Can be out of isolation after 10 days, starting 2/3   Thrombocytopenia -Presented with platelet 8000 on admission.  Transfused 1 unit platelets -Hematology/oncology following.  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  Transfuse for platelets <20,000 for acute bleeding -Follow CBC  Acute colitis -GI PCR  negative -CT abdomen pelvis reveals colitis of the distal descending and sigmoid colon -Completed antibiotic therapy  Multiple myeloma -Follows with [name]-Completed course of Decadron  Paroxysmal atrial flutter -CHA2DS2-VASc 4 -Anticoagulation currently on hold due to thrombocytopenia  Hypertension -Continue hydralazine, Imdur  Polycystic morphology kidneys -Follow-up outpatient    DVT prophylaxis:  SCDs Start: 03/19/20 1051  Code Status: Full code Family Communication: None at bedside Disposition Plan:  Status is: Inpatient  Remains inpatient appropriate because:IV treatments appropriate due to intensity of illness or inability to take PO and Inpatient level of care appropriate due to severity of illness   Dispo: The patient is from: Home              Anticipated d/c is to: Home              Anticipated d/c date is: 2 days              Patient currently is not medically stable to d/c.   Difficult to place patient No   Antimicrobials:  Anti-infectives (From admission, onward)   Start     Dose/Rate Route Frequency Ordered Stop   03/21/20 2200  amoxicillin-clavulanate (AUGMENTIN) 500-125 MG per tablet 500 mg  Status:  Discontinued        1 tablet Oral 2 times daily 03/21/20 1527 03/24/20 0729   03/19/20 2200  piperacillin-tazobactam (ZOSYN) IVPB 3.375 g  Status:  Discontinued        3.375 g 12.5 mL/hr over 240 Minutes Intravenous Every 12 hours 03/19/20 1020 03/21/20 1527   03/19/20 2200  acyclovir (ZOVIRAX) 200 MG capsule 200 mg  200 mg Oral 2 times daily 03/19/20 1611     03/19/20 0300  piperacillin-tazobactam (ZOSYN) IVPB 2.25 g        2.25 g 100 mL/hr over 30 Minutes Intravenous Every 8 hours 03/19/20 0228 03/19/20 1643       Objective: Vitals:   03/23/20 2120 03/24/20 0427 03/24/20 0432 03/24/20 0744  BP: (!) 186/103 (!) 171/100  (!) 174/88  Pulse: 96 94    Resp: 20 16    Temp: 98.3 F (36.8 C) 98.6 F (37 C)    TempSrc: Oral Oral     SpO2: 100% 95%    Weight:   88.4 kg   Height:        Intake/Output Summary (Last 24 hours) at 03/24/2020 1238 Last data filed at 03/24/2020 1100 Gross per 24 hour  Intake 360 ml  Output 1700 ml  Net -1340 ml   Filed Weights   03/22/20 0512 03/23/20 0500 03/24/20 0432  Weight: 87.3 kg 92.5 kg 88.4 kg   Examination: General exam: Appears calm and comfortable  Respiratory system: Clear to auscultation. Respiratory effort normal. Cardiovascular system: S1 & S2 heard, RRR. + Mild bilateral pitting pedal edema. Gastrointestinal system: Abdomen is nondistended, soft and nontender. Normal bowel sounds heard. Central nervous system: Alert and oriented. Non focal exam. Speech clear  Extremities: Symmetric in appearance bilaterally  Skin: No rashes, lesions or ulcers on exposed skin  Psychiatry: Judgement and insight appear stable. Mood & affect appropriate.    Data Reviewed: I have personally reviewed following labs and imaging studies  CBC: Recent Labs  Lab 03/18/20 1747 03/19/20 0624 03/19/20 1118 03/20/20 0414 03/21/20 0500 03/22/20 0503 03/23/20 0650 03/24/20 0349  WBC 7.4 9.2 11.0* 12.9* 9.0 5.8 3.7* 2.9*  NEUTROABS 6.0 8.4* 10.1*  --   --   --   --   --   HGB 11.8* 11.9* 10.6* 10.4* 9.9* 9.7* 10.1* 9.8*  HCT 35.4* 35.3* 31.9* 31.0* 28.8* 28.8* 30.2* 29.0*  MCV 95.2 95.7 94.9 95.1 93.8 94.7 94.7 95.4  PLT 8* 44* 38* 35* 35* 39* 42* 40*   Basic Metabolic Panel: Recent Labs  Lab 03/19/20 0624 03/19/20 1118 03/19/20 1544 03/20/20 0414 03/21/20 0500 03/22/20 0503 03/23/20 0650 03/24/20 0349  NA  --  130*   < > 130* 131* 136 135 136  K  --  4.5   < > 4.7 4.0 3.6 3.3* 3.6  CL  --  96*   < > 100 95* 96* 95* 98  CO2  --  16*   < > 16* '25 27 27 26  ' GLUCOSE  --  165*   < > 127* 100* 87 93 80  BUN  --  62*   < > 71* 76* 70* 65* 69*  CREATININE  --  3.87*   < > 4.43* 4.42* 4.29* 4.22* 4.36*  CALCIUM  --  8.1*   < > 8.4* 8.1* 8.2* 7.9* 8.0*  MG 2.1 2.2  --   --   --    --   --   --   PHOS 4.2 4.8*  --   --   --   --   --   --    < > = values in this interval not displayed.   GFR: Estimated Creatinine Clearance: 13.9 mL/min (A) (by C-G formula based on SCr of 4.36 mg/dL (H)). Liver Function Tests: Recent Labs  Lab 03/19/20 1118 03/20/20 0414  AST 21 17  ALT 12 11  ALKPHOS  53 47  BILITOT 1.0 0.9  PROT 5.0* 4.9*  ALBUMIN 3.1* 2.9*   No results for input(s): LIPASE, AMYLASE in the last 168 hours. No results for input(s): AMMONIA in the last 168 hours. Coagulation Profile: No results for input(s): INR, PROTIME in the last 168 hours. Cardiac Enzymes: Recent Labs  Lab 03/19/20 0624  CKTOTAL 36*   BNP (last 3 results) No results for input(s): PROBNP in the last 8760 hours. HbA1C: No results for input(s): HGBA1C in the last 72 hours. CBG: No results for input(s): GLUCAP in the last 168 hours. Lipid Profile: No results for input(s): CHOL, HDL, LDLCALC, TRIG, CHOLHDL, LDLDIRECT in the last 72 hours. Thyroid Function Tests: No results for input(s): TSH, T4TOTAL, FREET4, T3FREE, THYROIDAB in the last 72 hours. Anemia Panel: No results for input(s): VITAMINB12, FOLATE, FERRITIN, TIBC, IRON, RETICCTPCT in the last 72 hours. Sepsis Labs: Recent Labs  Lab 03/19/20 1324  LATICACIDVEN 1.7    Recent Results (from the past 240 hour(s))  Urine culture     Status: Abnormal   Collection Time: 03/18/20  6:57 PM   Specimen: Urine, Random  Result Value Ref Range Status   Specimen Description   Final    URINE, RANDOM Performed at [facility] [address], [place], [place] [address]    Special Requests   Final    NONE Performed at [facility], [place] [address], [place], [place] [address]    Culture (A)  Final    <10,000 COLONIES/mL INSIGNIFICANT GROWTH Performed at [facility] [name], [place], [place] [address]    Report Status 03/20/2020 FINAL  Final  SARS CORONAVIRUS 2 (TAT 6-24 HRS)  Nasopharyngeal Nasopharyngeal Swab     Status: Abnormal   Collection Time: 03/19/20  2:04 AM   Specimen: Nasopharyngeal Swab  Result Value Ref Range Status   SARS Coronavirus 2 POSITIVE (A) NEGATIVE Final    Comment: (NOTE) SARS-CoV-2 target nucleic acids are DETECTED.  The SARS-CoV-2 RNA is generally detectable in upper and lower respiratory specimens during the acute phase of infection. Positive results are indicative of the presence of SARS-CoV-2 RNA. Clinical correlation with patient history and other diagnostic information is  necessary to determine patient infection status. Positive results do not rule out bacterial infection or co-infection with other viruses.  The expected result is Negative.  Fact Sheet for Patients: SugarRoll.be  Fact Sheet for Healthcare Providers: https://www.woods-mathews.com/  This test is not yet approved or cleared by the [place] FDA and  has been authorized for detection and/or diagnosis of SARS-CoV-2 by FDA under an Emergency Use Authorization (EUA). This EUA will remain  in effect (meaning this test can be used) for the duration of the COVID-19 declaration under Section 564(b)(1) of the Act, 21 U. S.C. section 360bbb-3(b)(1), unless the authorization is terminated or revoked sooner.   Performed at [facility], [place] [address], [place], [place] [address]   Stool culture (children & immunocomp patients)     Status: None   Collection Time: 03/19/20 10:52 AM   Specimen: Perirectal; Stool  Result Value Ref Range Status   Salmonella/Shigella Screen NSTCUL  Final    Comment: (NOTE) Test not performed. No stool culture transport device received.      Emani R. was notified 03/20/2020.    Campylobacter Culture NSTCUL  Final    Comment: (NOTE) Test not performed. No stool culture transport device received.      Emani R. was notified  03/20/2020.    E coli, Shiga toxin Assay NSTCUL  Final     Comment: (NOTE) Test not performed. No stool culture transport device received.      Emani R. was notified 03/20/2020. Performed At: [facility] [place], [place] [id number] Rush Farmer MD YD:[id number]   Gastrointestinal Panel by PCR , Stool     Status: None   Collection Time: 03/19/20 10:52 AM   Specimen: Perirectal; Stool  Result Value Ref Range Status   Campylobacter species NOT DETECTED NOT DETECTED Final   Plesimonas shigelloides NOT DETECTED NOT DETECTED Final   Salmonella species NOT DETECTED NOT DETECTED Final   Yersinia enterocolitica NOT DETECTED NOT DETECTED Final   Vibrio species NOT DETECTED NOT DETECTED Final   Vibrio cholerae NOT DETECTED NOT DETECTED Final   Enteroaggregative E coli (EAEC) NOT DETECTED NOT DETECTED Final   Enteropathogenic E coli (EPEC) NOT DETECTED NOT DETECTED Final   Enterotoxigenic E coli (ETEC) NOT DETECTED NOT DETECTED Final   Shiga like toxin producing E coli (STEC) NOT DETECTED NOT DETECTED Final   Shigella/Enteroinvasive E coli (EIEC) NOT DETECTED NOT DETECTED Final   Cryptosporidium NOT DETECTED NOT DETECTED Final   Cyclospora cayetanensis NOT DETECTED NOT DETECTED Final   Entamoeba histolytica NOT DETECTED NOT DETECTED Final   Giardia lamblia NOT DETECTED NOT DETECTED Final   Adenovirus F40/41 NOT DETECTED NOT DETECTED Final   Astrovirus NOT DETECTED NOT DETECTED Final   Norovirus GI/GII NOT DETECTED NOT DETECTED Final   Rotavirus A NOT DETECTED NOT DETECTED Final   Sapovirus (I, II, IV, and V) NOT DETECTED NOT DETECTED Final    Comment: Performed at [facility], [address] [place], [place] [address]      Radiology Studies: No results found.    Scheduled Meds: . acyclovir  200 mg Oral BID  . amLODipine  2.5 mg Oral Daily  . carvedilol  3.125 mg Oral BID WC  . Chlorhexidine Gluconate Cloth  6 each Topical Daily  . hydrALAZINE  75 mg Oral Q8H  . isosorbide mononitrate  30 mg Oral BID  .  loratadine  10 mg Oral Daily  . sodium chloride flush  10-40 mL Intracatheter Q12H   Continuous Infusions: . sodium chloride 50 mL/hr at 03/24/20 0840     LOS: 6 days      Time spent: 25 minutes   Dessa Phi, DO [facility] 03/24/2020, 12:38 PM   Available via Epic secure chat 7am-7pm After these hours, please refer to coverage provider listed on [email or web address]

## 2020-03-24 NOTE — Consult Note (Signed)
Consultation Note Date: 03/24/2020   Patient Name: Debbie Chan  DOB: June 03, 1950  MRN: [id number]  Age / Sex: 70 y.o., female  PCP: [name], MD Referring Physician: Dessa Phi, DO  Reason for Consultation: Establishing goals of care  HPI/Patient Profile: 70 y.o. female  with past medical history of atrial flutter, multiple myeloma admitted on 03/18/2020 with weakness and decreased appetite.  She has a complicated overall picture with multiple system involvement, most of which likely stems from either underlying myeloma or treatment associated with it.  Palliative consulted for goals of care.  Clinical Assessment and Goals of Care: Palliative care consult received.  Chart reviewed including personal review of pertinent labs and imaging.  I saw and examined Debbie Chan today.  I offered medical translator to facilitate discussion.  She declined for me to call an interpreter but called for her son to participate in conversation.  I introduced palliative care as specialized medical care for people living with serious illness. It focuses on providing relief from the symptoms and stress of a serious illness. The goal is to improve quality of life for both the patient and the family.  During my encounter with her today, she minimized symptoms and reports that she is feeling better overall.  She reports that the doctors have been doing a good job explaining things to her, but when I asked her to share with me what they have been telling her she deferred to her son.  Her son shares with me that the most important things to his mother are her faith and her family.  They are both about Christians who believes that God will intervene on her behalf and provide healing for her.  They are hopeful this happens in the near future as she is desperately wanting to travel back to her home in [place] where she has four  other children.  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  They both still view her time here as a "temporary trip for my graduation."  Reviewing her chart, it appears she has been here and receiving treatment since at least July of 2019.  We discussed her clinical course this admission with multiorgan involvement.  I attempted to review his best possibly interconnected nature of her multiple comorbidities including acute colitis, renal failure possibly related to hypovolemia from the colitis, myeloma kidney, or thrombotic microangiopathy, CHF likely worsened by chemotherapy for her multiple myeloma, thrombocytopenia, and COVID-19 infection.  Her son does have understanding of these issues and relayed messages from other physicians regarding where each of these individual problems stand.  This includes discussing with nephrologist that, at this point, it appears she will be able to avoid dialysis.  Also discussed conversation with cardiology team that her heart failure is likely secondary to treatment for her multiple myeloma.  He requests further input from cardiology and oncology once they have had a chance to discuss the treatment of myeloma moving forward.  Overall, patient  and her son remain focused on plan for need aggressive interventions with hope for stabilization of function and that she will be healed in God's time through his intervention.  I do not believe there desire for aggressive interventions is likely to change anytime in the near future.  Questions and concerns addressed.   PMT will continue to follow intermittently to support holistically.  SUMMARY OF RECOMMENDATIONS   -Full code/full scope treatment -Both patient and her son are very devout Christians and are very hopeful for complete healing of her medical issues through God's intervention.  Despite the fact that she has been receiving treatment since 2019, both  she and her son both reports that she is in the [place] on a "temporary trip for his graduation" and expect that at some point God will heal her so she can return back to [place] where she has four other children.  They are focused on positive news they receive (some stabilization of kidney function meaning she is not likely to require dialysis in the immediate future) and not really open to conversation about "what ifs" if her condition does not improve. -Palliative will continue to check in intermittently to support patient and her family.  I do not think aggressive interventions is likely to change in the near future (if at all). -Please call if there are other specific needs with which we can be of assistance.  Code Status/Advance Care Planning:  Full code  Palliative Prophylaxis:   Frequent Pain Assessment  Additional Recommendations (Limitations, Scope, Preferences):  Full Scope Treatment  Psycho-social/Spiritual:   Desire for further Chaplaincy support: Did not address this visit.  Additional Recommendations: Caregiving  Support/Resources  Prognosis:   Guarded  Discharge Planning: To Be Determined      Primary Diagnoses: Present on Admission: . Thrombocytopenia ([place]) . Colitis . Hematuria . Multiple myeloma without remission ([place]) . Atrial flutter ([place]) . Anemia . AKI (acute kidney injury) ([place]) . Hyponatremia . Dehydration   I have reviewed the medical record, interviewed the patient and family, and examined the patient. The following aspects are pertinent.  Past Medical History:  Diagnosis Date  . Cancer [facility])    Social History   Socioeconomic History  . Marital status: Widowed    Spouse name: Not on file  . Number of children: 4  . Years of education: Not on file  . Highest education level: Not on file  Occupational History  . Not on file  Tobacco Use  . Smoking status: Never Smoker  . Smokeless tobacco: Never Used  Vaping Use  . Vaping  Use: Never used  Substance and Sexual Activity  . Alcohol use: Never  . Drug use: Never  . Sexual activity: Not on file  Other Topics Concern  . Not on file  Social History Narrative  . Not on file   Social Determinants of Health   Financial Resource Strain: Not on file  Food Insecurity: Not on file  Transportation Needs: Not on file  Physical Activity: Not on file  Stress: Not on file  Social Connections: Not on file   Family History  Problem Relation Age of Onset  . Hypertension Other    Scheduled Meds: . acyclovir  200 mg Oral BID  . amLODipine  2.5 mg Oral Daily  . carvedilol  3.125 mg Oral BID WC  . Chlorhexidine Gluconate Cloth  6 each Topical Daily  . hydrALAZINE  75 mg Oral Q8H  . isosorbide mononitrate  30 mg Oral BID  .  loratadine  10 mg Oral Daily  . sodium chloride flush  10-40 mL Intracatheter Q12H   Continuous Infusions: . sodium chloride 50 mL/hr at 03/24/20 0840   PRN Meds:.acetaminophen **OR** acetaminophen, hydrALAZINE, HYDROcodone-acetaminophen, melatonin, sodium chloride, sodium chloride flush Medications Prior to Admission:  Prior to Admission medications   Medication Sig Start Date End Date Taking? Authorizing Provider  acyclovir (ZOVIRAX) 200 MG capsule Take 1 capsule (200 mg total) by mouth 2 (two) times daily. 12/01/19  Yes Heilingoetter, Cassandra L, PA-C  dexamethasone (DECADRON) 4 MG tablet Take 10 tablets (40 mg total) by mouth every 7 (seven) days. 01/05/20  Yes Curt Bears, MD  diphenhydrAMINE (BENADRYL ALLERGY) 25 MG tablet Take 1 tablet (25 mg total) by mouth every 6 (six) hours as needed for itching. 10/23/17  Yes Tanner, Lyndon Code., PA-C  furosemide (LASIX) 40 MG tablet Take 40 mg by mouth daily as needed. 05/24/19  Yes [provider]  loratadine (CLARITIN) 10 MG tablet Take 10 mg by mouth daily.   Yes [provider]  MELATONIN PO Take 10 mg by mouth at bedtime as needed (sleep).   Yes [provider]   metoprolol tartrate (LOPRESSOR) 25 MG tablet TAKE 1/2 TABLET BY MOUTH TWICE DAILY Patient taking differently: 25 mg. 10/28/19  Yes Curt Bears, MD  omeprazole (PRILOSEC) 20 MG capsule Take 1 capsule (20 mg total) by mouth daily. 12/01/19  Yes Heilingoetter, Cassandra L, PA-C  blood glucose meter kit and supplies Dispense based on patient and insurance preference. Use up to four times daily as directed. (FOR ICD-10 E10.9, E11.9). 09/28/17   Arrien, Jimmy Picket, MD  lidocaine-prilocaine (EMLA) cream Apply 1 application topically as needed. Patient not taking: Reported on 03/19/2020 01/05/20   Curt Bears, MD  oxyCODONE-acetaminophen (PERCOCET) 5-325 MG tablet Take 2 tablets by mouth every 6 (six) hours as needed. Patient not taking: No sig reported 02/17/19   Milton Ferguson, MD  prochlorperazine (COMPAZINE) 10 MG tablet Take 1 tablet (10 mg total) by mouth every 6 (six) hours as needed for nausea or vomiting. Patient not taking: No sig reported 01/05/20   Curt Bears, MD  RELION GLUCOSE TEST STRIPS test strip USE 1 TO CHECK GLUCOSE ONCE DAILY 03/06/18   [provider]  ReliOn Ultra Thin Lancets 30G MISC USE 1 TO CHECK GLUCOSE ONCE DAILY FOR GLUCOSE TESTING 03/06/18   [provider]   No Known Allergies Review of Systems  At time of my encounter, she denies all complaints other than needing to stand up because her legs feel tight.  Physical Exam  General: Alert, awake, in no acute distress.  HEENT: No bruits, no goiter, no JVD Heart: Regular rate and rhythm. No murmur appreciated. Lungs: Good air movement, clear Abdomen: Soft, nontender, nondistended, positive bowel sounds.  Ext: No significant edema Skin: Warm and dry Neuro: Grossly intact, nonfocal.  Vital Signs: BP (!) 174/88 (BP Location: Right Arm)   Pulse 94   Temp 98.6 F (37 C) (Oral)   Resp 16   Ht 5' 7" (1.702 m)   Wt 88.4 kg   SpO2 95%   BMI 30.52 kg/m  Pain Scale: 0-10   Pain Score:  0-No pain   SpO2: SpO2: 95 % O2 Device:SpO2: 95 % O2 Flow Rate: .   IO: Intake/output summary:   Intake/Output Summary (Last 24 hours) at 03/24/2020 1108 Last data filed at 03/24/2020 0733 Gross per 24 hour  Intake 720 ml  Output 1300 ml  Net -580 ml  LBM: Last BM Date: 03/24/20 Baseline Weight: Weight: 87 kg Most recent weight: Weight: 88.4 kg     Palliative Assessment/Data:   Flowsheet Rows   Flowsheet Row Most Recent Value  Intake Tab   Referral Department Hospitalist  Unit at Time of Referral Med/Surg Unit  Palliative Care Primary Diagnosis Cancer  Date Notified 03/22/20  Palliative Care Type New Palliative care  Reason for referral Clarify Goals of Care  Date of Admission 03/18/20  Date first seen by Palliative Care 03/23/20  # of days Palliative referral response time 1 Day(s)  # of days IP prior to Palliative referral 4  Clinical Assessment   Palliative Performance Scale Score 60%  Psychosocial & Spiritual Assessment   Palliative Care Outcomes   Patient/Family meeting held? Yes  Who was at the meeting? Patient, son via phone      Time In: 1630 Time Out: 1750 Time Total: 80 Greater than 50%  of this time was spent counseling and coordinating care related to the above assessment and plan.  Signed by: Micheline Rough, MD   Please contact Palliative Medicine Team phone at [phone] for questions and concerns.  For individual provider: See Shea Evans

## 2020-03-24 NOTE — Progress Notes (Signed)
Progress Note  Patient Name: Debbie Chan Date of Encounter: 03/24/2020  [facility] Cardiologist: Glori Bickers, MD   Subjective   Able to lie at 20 deg HOB elevation. Slept well. Yesterday's weight was an error. Up almost 5 lb since admission. Tachycardic. BP high. Electrolytes OK. Creatinine unchanged.  Inpatient Medications    Scheduled Meds: . acyclovir  200 mg Oral BID  . Chlorhexidine Gluconate Cloth  6 each Topical Daily  . hydrALAZINE  50 mg Oral Q8H  . isosorbide mononitrate  30 mg Oral BID  . loratadine  10 mg Oral Daily  . sodium chloride flush  10-40 mL Intracatheter Q12H   Continuous Infusions: . sodium chloride 50 mL/hr at 03/24/20 0840   PRN Meds: acetaminophen **OR** acetaminophen, hydrALAZINE, HYDROcodone-acetaminophen, melatonin, sodium chloride, sodium chloride flush   Vital Signs    Vitals:   03/23/20 2120 03/24/20 0427 03/24/20 0432 03/24/20 0744  BP: (!) 186/103 (!) 171/100  (!) 174/88  Pulse: 96 94    Resp: 20 16    Temp: 98.3 F (36.8 C) 98.6 F (37 C)    TempSrc: Oral Oral    SpO2: 100% 95%    Weight:   88.4 kg   Height:        Intake/Output Summary (Last 24 hours) at 03/24/2020 1011 Last data filed at 03/24/2020 0733 Gross per 24 hour  Intake 720 ml  Output 1300 ml  Net -580 ml   Last 3 Weights 03/24/2020 03/23/2020 03/22/2020  Weight (lbs) 194 lb 14.2 oz 203 lb 14.8 oz 192 lb 7.4 oz  Weight (kg) 88.4 kg 92.5 kg 87.3 kg      Telemetry    Sinus rhythm/mild sinus tachycardia 120s, frequent PVCs, couplets, 3-beat NSVT - Personally Reviewed  ECG    No new tracing - Personally Reviewed  Physical Exam   GEN: No acute distress.   Neck: prominent v waves, minimal JVD Cardiac: RRR, 2/6 holosystolic LLSB murmur, no diastolic murmurs, rubs, or gallops.  Respiratory: Clear to auscultation bilaterally. GI: Soft, nontender, non-distended  MS: 1-2+ ankle edema; No deformity. Neuro:  Nonfocal  Psych: Normal affect   Labs     High Sensitivity Troponin:  No results for input(s): TROPONINIHS in the last 720 hours.    Chemistry Recent Labs  Lab 03/19/20 1118 03/19/20 1544 03/20/20 0414 03/21/20 0500 03/22/20 0503 03/23/20 0650 03/24/20 0349  NA 130*   < > 130*   < > 136 135 136  K 4.5   < > 4.7   < > 3.6 3.3* 3.6  CL 96*   < > 100   < > 96* 95* 98  CO2 16*   < > 16*   < > '27 27 26  ' GLUCOSE 165*   < > 127*   < > 87 93 80  BUN 62*   < > 71*   < > 70* 65* 69*  CREATININE 3.87*   < > 4.43*   < > 4.29* 4.22* 4.36*  CALCIUM 8.1*   < > 8.4*   < > 8.2* 7.9* 8.0*  PROT 5.0*  --  4.9*  --   --   --   --   ALBUMIN 3.1*  --  2.9*  --   --   --   --   AST 21  --  17  --   --   --   --   ALT 12  --  11  --   --   --   --  ALKPHOS 53  --  47  --   --   --   --   BILITOT 1.0  --  0.9  --   --   --   --   GFRNONAA 12*   < > 10*   < > 11* 11* 10*  ANIONGAP 18*   < > 14   < > '13 13 12   ' < > = values in this interval not displayed.     Hematology Recent Labs  Lab 03/22/20 0503 03/23/20 0650 03/24/20 0349  WBC 5.8 3.7* 2.9*  RBC 3.04* 3.19* 3.04*  HGB 9.7* 10.1* 9.8*  HCT 28.8* 30.2* 29.0*  MCV 94.7 94.7 95.4  MCH 31.9 31.7 32.2  MCHC 33.7 33.4 33.8  RDW 16.6* 16.9* 17.0*  PLT 39* 42* 40*    BNP Recent Labs  Lab 03/19/20 1715 03/21/20 1235  BNP 1,809.1* 594.1*     DDimer  Recent Labs  Lab 03/19/20 0100  DDIMER 1.36*     Radiology    No results found.  Cardiac Studies   03/19/20 echo  1. Left ventricular ejection fraction, by estimation, is 25 to 30%. The  left ventricle has severely decreased function. The left ventricle  demonstrates global hypokinesis although the septal LV segments appear  slightly worse. Left ventricular diastolic  parameters are consistent with Grade II diastolic dysfunction  (pseudonormalization).  2. Right ventricular systolic function is mildly reduced. The right  ventricular size is moderately enlarged. There is moderately elevated  pulmonary artery  systolic pressure.  3. The mitral valve is grossly normal. Mild mitral valve regurgitation.  4. Right atrial size was massively dilated.  5. Tricuspid valve regurgitation is moderate to severe.  6. The aortic valve is tricuspid. There is mild calcification of the  aortic valve. There is mild thickening of the aortic valve. Aortic valve  regurgitation is mild.  7. Aortic dilatation noted. 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  8. The inferior vena cava is normal in size with greater than 50%  respiratory variability, suggesting right atrial pressure of 3 mmHg.    Patient Profile     70 y.o. female with multiple myeloma not in remission, status post 11 cycles of carfilzomib and cyclophosphamide, history of paroxysmal atrial flutter 2019, history of severe tricuspid regurgitation and right heart enlargement, admitted for suspected infectious colitis, acute renal insufficiency and severe thrombocytopenia and found to have severely depressed left ventricular systolic function, new compared to 2019.  Assessment & Plan    1. CHF: not overtly decompensated, but a little more arrhythmia. Cause of cardiomyopathy is unclear, but the leading suspect is cardiotoxic effect from carfilzomib chemotherapy.  Symptoms of HF seem to precede the COVID infection, no other features suggest acute myocarditis. Haptoglobin low, raising possibility of thrombotic microangiopathy, but no schistocytes. Unlikely to be due to CAD, but will plan outpatient Lexiscan Myoview (avoiding contrast based coronary angio). Consider right heart cath after her COVID isolation is completed (February 4 by my calculation). Increase hydralazine. Add carvedilol today. 2.Chronic right heart failure:Severe tricuspid regurgitation, preserved right ventricular systolic function, massively dilated right atrium present since 2019 suggest chronic etiology, mechanism unclear. At this point without overt  findings of increased right heart filling pressures, no edema. 3.History of paroxysmal atrial flutter:Not during this admission so far. Anticoagulation currently on hold due to markedly worsened renal function. Intermediate to high embolic risk with a [id number] score of 4. 4. SPQ:ZRAQTMAU hydralazine/nitrates in view of  abnormal renal function. Avoid RAAS inhibitors and spironolactone. I am also concerned that she is at risk for decompensation and will avoid beta-blockers at this time, although ideally these will be started before discharge. 5.IgAMM:Unfortunately, concerned that the current chemotherapy is causing her cardiac decompensation. She has failed Velcade and had allergic response to Pomylast. We will have to discuss alternatives with her oncologist. 6. Ac on OOW:INEYHD no net change over the last 5 days. Kidney ultrasound suggest chronic medical disease with cortical thinning.However, the worsening of her renal function has been relatively abrupt (creat 1.2 as recently as January 19).Consider acute tubular necrosis from hypovolemia from the colitis, myeloma kidney, thrombotic microangiopathy. 7. COVID-19:Probably the cause of her gastroenteritis. No respiratory manifestations. Her cough precedes the current hospitalization and Covid infection by several weeks and might have been a sign of fluid overload/heart failure. 8. Thrombocytopenia: severe at 8k which prompted admission, but stable at 35-40K since then. Probably due to the hematological malignancy and chemotherapy. Smoldering TTP?     For questions or updates, please contact [place] Please consult [email or web address] for contact info under        Signed, Sanda Klein, MD  03/24/2020, 10:11 AM

## 2020-03-25 LAB — CBC
HCT: 31 % — ABNORMAL LOW (ref 36.0–46.0)
Hemoglobin: 10.4 g/dL — ABNORMAL LOW (ref 12.0–15.0)
MCH: 32.5 pg (ref 26.0–34.0)
MCHC: 33.5 g/dL (ref 30.0–36.0)
MCV: 96.9 fL (ref 80.0–100.0)
Platelets: 39 10*3/uL — ABNORMAL LOW (ref 150–400)
RBC: 3.2 MIL/uL — ABNORMAL LOW (ref 3.87–5.11)
RDW: 16.8 % — ABNORMAL HIGH (ref 11.5–15.5)
WBC: 2.9 10*3/uL — ABNORMAL LOW (ref 4.0–10.5)
nRBC: 0 % (ref 0.0–0.2)

## 2020-03-25 LAB — BASIC METABOLIC PANEL
Anion gap: 12 (ref 5–15)
BUN: 69 mg/dL — ABNORMAL HIGH (ref 8–23)
CO2: 25 mmol/L (ref 22–32)
Calcium: 7.8 mg/dL — ABNORMAL LOW (ref 8.9–10.3)
Chloride: 98 mmol/L (ref 98–111)
Creatinine, Ser: 4.16 mg/dL — ABNORMAL HIGH (ref 0.44–1.00)
GFR, Estimated: 11 mL/min — ABNORMAL LOW (ref >60)
Glucose, Bld: 89 mg/dL (ref 70–99)
Potassium: 3.6 mmol/L (ref 3.5–5.1)
Sodium: 135 mmol/L (ref 135–145)

## 2020-03-25 MED ORDER — CARVEDILOL 6.25 MG PO TABS
6.2500 mg | ORAL_TABLET | Freq: Two times a day (BID) | ORAL | Status: DC
  Administered 2020-03-25 – 2020-03-27 (×4): 6.25 mg via ORAL
  Filled 2020-03-25 (×4): qty 1

## 2020-03-25 MED ORDER — HYDRALAZINE HCL 50 MG PO TABS
100.0000 mg | ORAL_TABLET | Freq: Three times a day (TID) | ORAL | Status: DC
Start: 2020-03-25 — End: 2020-04-05
  Administered 2020-03-25 – 2020-04-04 (×31): 100 mg via ORAL
  Filled 2020-03-25 (×31): qty 2

## 2020-03-25 NOTE — Progress Notes (Signed)
Progress Note  Patient Name: Debbie Chan Date of Encounter: 03/25/2020  [facility] Cardiologist: Glori Bickers, MD   Subjective   Little change subjectively.  Continues to have a cough, but this is not so clearly positional anymore.  Denies dyspnea at rest or orthopnea.  1-2+ pretibial edema. But at 1300 mL / 24 hours and is not in acute distress. Weight is up 7 pounds from admission. Frequent PVCs and some runs of sustained supraventricular tachycardia on monitor. Blood pressure still quite high.  Inpatient Medications    Scheduled Meds: . acyclovir  200 mg Oral BID  . amLODipine  2.5 mg Oral Daily  . carvedilol  6.25 mg Oral BID WC  . Chlorhexidine Gluconate Cloth  6 each Topical Daily  . hydrALAZINE  100 mg Oral Q8H  . isosorbide mononitrate  30 mg Oral BID  . loratadine  10 mg Oral Daily  . sodium chloride flush  10-40 mL Intracatheter Q12H   Continuous Infusions:  PRN Meds: acetaminophen **OR** acetaminophen, hydrALAZINE, HYDROcodone-acetaminophen, melatonin, sodium chloride, sodium chloride flush   Vital Signs    Vitals:   03/25/20 0500 03/25/20 0501 03/25/20 1010 03/25/20 1012  BP:  (!) 189/116 (!) 153/68 (!) 153/68  Pulse:  81 91   Resp:  16    Temp:  98.5 F (36.9 C)    TempSrc:  Oral    SpO2:  98%    Weight: 89.4 kg     Height:        Intake/Output Summary (Last 24 hours) at 03/25/2020 1202 Last data filed at 03/25/2020 0415 Gross per 24 hour  Intake 399.94 ml  Output 900 ml  Net -500.06 ml   Last 3 Weights 03/25/2020 03/24/2020 03/23/2020  Weight (lbs) 197 lb 1.5 oz 194 lb 14.2 oz 203 lb 14.8 oz  Weight (kg) 89.4 kg 88.4 kg 92.5 kg      Telemetry    Please sinus rhythm with frequent monomorphic PVCs, some ventricular couplets, no episodes of VT; at times the PVCs appear to trigger a long-short sequence followed by sustained narrow complex short RP tachycardia at 150 bpm, probably AV node reentry.  Personally  Reviewed   Onset   Termination     ECG    No new tracing - Personally Reviewed  Physical Exam  Lying in bed with head of bed elevated at no more than 20 degrees GEN: No acute distress.   Neck:  7 cm JVD with prominent V waves Cardiac: RRR with frequent ectopy, 2/6 holosystolic left lower sternal border murmur, no diastolic murmurs, rubs, or gallops.  Respiratory: Clear to auscultation bilaterally. GI: Soft, nontender, non-distended  MS:  2+ symmetrical pretibial edema; No deformity. Neuro:  Nonfocal  Psych: Normal affect   Labs    High Sensitivity Troponin:  No results for input(s): TROPONINIHS in the last 720 hours.    Chemistry Recent Labs  Lab 03/19/20 1118 03/19/20 1544 03/20/20 0414 03/21/20 0500 03/23/20 0650 03/24/20 0349 03/25/20 0435  NA 130*   < > 130*   < > 135 136 135  K 4.5   < > 4.7   < > 3.3* 3.6 3.6  CL 96*   < > 100   < > 95* 98 98  CO2 16*   < > 16*   < > '27 26 25  ' GLUCOSE 165*   < > 127*   < > 93 80 89  BUN 62*   < > 71*   < > 65*  69* 69*  CREATININE 3.87*   < > 4.43*   < > 4.22* 4.36* 4.16*  CALCIUM 8.1*   < > 8.4*   < > 7.9* 8.0* 7.8*  PROT 5.0*  --  4.9*  --   --   --   --   ALBUMIN 3.1*  --  2.9*  --   --   --   --   AST 21  --  17  --   --   --   --   ALT 12  --  11  --   --   --   --   ALKPHOS 53  --  47  --   --   --   --   BILITOT 1.0  --  0.9  --   --   --   --   GFRNONAA 12*   < > 10*   < > 11* 10* 11*  ANIONGAP 18*   < > 14   < > '13 12 12   ' < > = values in this interval not displayed.     Hematology Recent Labs  Lab 03/23/20 0650 03/24/20 0349 03/25/20 0435  WBC 3.7* 2.9* 2.9*  RBC 3.19* 3.04* 3.20*  HGB 10.1* 9.8* 10.4*  HCT 30.2* 29.0* 31.0*  MCV 94.7 95.4 96.9  MCH 31.7 32.2 32.5  MCHC 33.4 33.8 33.5  RDW 16.9* 17.0* 16.8*  PLT 42* 40* 39*    BNP Recent Labs  Lab 03/19/20 1715 03/21/20 1235  BNP 1,809.1* 594.1*     DDimer  Recent Labs  Lab 03/19/20 0100  DDIMER 1.36*     Radiology    No  results found.  Cardiac Studies    03/19/20 echo  1. Left ventricular ejection fraction, by estimation, is 25 to 30%. The  left ventricle has severely decreased function. The left ventricle  demonstrates global hypokinesis although the septal LV segments appear  slightly worse. Left ventricular diastolic  parameters are consistent with Grade II diastolic dysfunction  (pseudonormalization).  2. Right ventricular systolic function is mildly reduced. The right  ventricular size is moderately enlarged. There is moderately elevated  pulmonary artery systolic pressure.  3. The mitral valve is grossly normal. Mild mitral valve regurgitation.  4. Right atrial size was massively dilated.  5. Tricuspid valve regurgitation is moderate to severe.  6. The aortic valve is tricuspid. There is mild calcification of the  aortic valve. There is mild thickening of the aortic valve. Aortic valve  regurgitation is mild.  7. Aortic dilatation noted. 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  8. The inferior vena cava is normal in size with greater than 50%  respiratory variability, suggesting right atrial pressure of 3 mmHg.    Patient Profile     70 y.o. female with multiple myeloma not in remission, status post 11 cycles of carfilzomib and cyclophosphamide, history of paroxysmal atrial flutter 2019, history of severe tricuspid regurgitation and right heart enlargement, admitted for suspected infectious colitis, acute renal insufficiency and severe thrombocytopenia and found to have severely depressed left ventricular systolic function, new compared to 2019.  Assessment & Plan    1. CHF:not overtly decompensated, but a little more arrhythmia and clear evidence of elevated right atrial pressure by exam.  Thankfully, no clear evidence of acute left heart failure. Cause of cardiomyopathy is unclear, but the leading suspect is cardiotoxic effect from carfilzomib  chemotherapy. Symptoms of HF seem to precede the COVID  infection, no other features suggest acute myocarditis. Haptoglobin low, raising possibility of thrombotic microangiopathy, but no schistocytes. Unlikely to be due to CAD, but will plan outpatient Lexiscan Myoview (avoiding contrast based coronary angio). Consider right heart cath after her COVID isolation is completed (February 4 by my calculation). Increase hydralazine and increase carvedilol today. 2.Chronic right heart failure:Severe tricuspid regurgitation, preserved right ventricular systolic function, massively dilated right atrium present since 2019 suggest chronic etiology, mechanism unclear. Has some edema and elevated JVD, would not give any additional fluids, but will hold off diuretics unless she has left heart failure symptoms. 3.History of paroxysmal atrial flutter:Not during this admission so far.Anticoagulation currently on hold due to markedly worsened renal function. Intermediate to high embolic risk with a [id number] score of 4. 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  Increase carvedilol. 5. JME:QASTMHD mostly onhydralazine/nitrates in view of abnormal renal function. Avoid RAAS inhibitors and spironolactone. Tentatively using increasing doses of beta-blockers, so far without signs of decompensation.  Also started amlodipine 2.5 mg yesterday. 6.IgAMM:Unfortunately, concerned that the current chemotherapy is causing her cardiac decompensation. She has failed Velcade and had allergic response to Pomylast. We will have to discuss alternatives with her oncologist. 6. Ac on QQI:WLNLGX no net change over the last 6 days.  May be a hint of improvement in creatinine today.Kidney ultrasound suggest chronic medical disease with cortical thinning.However, the worsening of her renal function has been relatively abrupt  (creat 1.2 as recently as January 19).Consideracute tubular necrosis from hypovolemia from the colitis,myeloma kidney, thrombotic microangiopathy. 7. COVID-19:Probably the cause of her gastroenteritis. Norespiratory manifestations. Her cough precedes the current hospitalization and Covid infection by several weeks and might have been a sign of fluid overload/heart failure. 8. Thrombocytopenia: severe at 8k which prompted admission, but stable at 35-40K since then. Probably due to the hematological malignancy and chemotherapy. Smoldering TTP?     For questions or updates, please contact [place] Please consult [email or web address] for contact info under        Signed, Sanda Klein, MD  03/25/2020, 12:02 PM

## 2020-03-25 NOTE — Progress Notes (Signed)
PROGRESS NOTE    Debbie Chan  ATF:[id number] DOB: Oct 24, 1950 DOA: 03/18/2020 PCP: [name], MD     Brief Narrative:  Debbie Chan is a 70 year old female with past medical history significant for multiple myeloma, atrial flutter, chronic anemia who presented to the hospital with complaints of weakness.  She was found to have severe thrombocytopenia and AKI.  She was given IV fluid, platelet transfusions.  She was also incidentally found to have COVID-19. Hospitalization complicated by renal failure as well as heart failure.  New events last 24 hours / Subjective: No new complaints today, denies SOB.   Assessment & Plan:   Active Problems:   Anemia   AKI (acute kidney injury) ([place])   Atrial flutter (HCC)   Multiple myeloma without remission (HCC)   Thrombocytopenia (HCC)   Colitis   Hematuria   Polycystic kidney   Hyponatremia   Dehydration   AKI on CKD stage IIIb -Baseline creatinine 1.2 -Renal ultrasound without sign of obstruction -Could be secondary to dehydration, myeloma kidney, heart failure -Nephrology signed off 1/30, planning to follow up with patient in office in 2 weeks   Acute systolic heart failure -BNP 1809.1 --> 594.1 -Suspect cardiomyopathy could be secondary to cardiotoxic effects from carfilzomib  -Cardiology following -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  On room air.  CRP 0.9. Can be out of isolation after 10 days, starting 2/3   Thrombocytopenia -Presented with platelet 8000 on admission.  Transfused 1 unit platelets -Hematology/oncology following.  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  Transfuse for platelets <20,000 for acute bleeding -Follow CBC  Acute colitis -GI PCR negative -CT abdomen pelvis reveals colitis of the distal descending and sigmoid colon -Completed antibiotic therapy  Multiple myeloma -Follows with Dr.  MJulien Nordmann-Completed course of Decadron  Paroxysmal atrial flutter -CHA2DS2-VASc 4 -Anticoagulation currently on hold due to thrombocytopenia  Hypertension -Continue hydralazine, Imdur  Polycystic morphology kidneys -Follow-up outpatient    DVT prophylaxis:  SCDs Start: 03/19/20 1051  Code Status: Full code Family Communication: None at bedside Disposition Plan:  Status is: Inpatient  Remains inpatient appropriate because:IV treatments appropriate due to intensity of illness or inability to take PO and Inpatient level of care appropriate due to severity of illness   Dispo: The patient is from: Home              Anticipated d/c is to: Home              Anticipated d/c date is: 2 days              Patient currently is not medically stable to d/c.   Difficult to place patient No   Antimicrobials:  Anti-infectives (From admission, onward)   Start     Dose/Rate Route Frequency Ordered Stop   03/21/20 2200  amoxicillin-clavulanate (AUGMENTIN) 500-125 MG per tablet 500 mg  Status:  Discontinued        1 tablet Oral 2 times daily 03/21/20 1527 03/24/20 0729   03/19/20 2200  piperacillin-tazobactam (ZOSYN) IVPB 3.375 g  Status:  Discontinued        3.375 g 12.5 mL/hr over 240 Minutes Intravenous Every 12 hours 03/19/20 1020 03/21/20 1527   03/19/20 2200  acyclovir (ZOVIRAX) 200 MG capsule 200 mg        200 mg Oral 2 times daily 03/19/20 1611     03/19/20 0300  piperacillin-tazobactam (ZOSYN) IVPB 2.25 g  2.25 g 100 mL/hr over 30 Minutes Intravenous Every 8 hours 03/19/20 0228 03/19/20 1643       Objective: Vitals:   03/25/20 0500 03/25/20 0501 03/25/20 1010 03/25/20 1012  BP:  (!) 189/116 (!) 153/68 (!) 153/68  Pulse:  81 91   Resp:  16    Temp:  98.5 F (36.9 C)    TempSrc:  Oral    SpO2:  98%    Weight: 89.4 kg     Height:        Intake/Output Summary (Last 24 hours) at 03/25/2020 1225 Last data filed at 03/25/2020 0415 Gross per 24 hour  Intake 399.94 ml   Output 900 ml  Net -500.06 ml   Filed Weights   03/23/20 0500 03/24/20 0432 03/25/20 0500  Weight: 92.5 kg 88.4 kg 89.4 kg    Examination: General exam: Appears calm and comfortable  Respiratory system: Clear to auscultation. Respiratory effort normal. Cardiovascular system: S1 & S2 heard, RRR. +pedal edema. Gastrointestinal system: Abdomen is nondistended, soft and nontender. Normal bowel sounds heard. Central nervous system: Alert and oriented. Non focal exam. Speech clear  Extremities: Symmetric in appearance bilaterally  Skin: No rashes, lesions or ulcers on exposed skin  Psychiatry: Judgement and insight appear stable. Mood & affect appropriate.   Data Reviewed: I have personally reviewed following labs and imaging studies  CBC: Recent Labs  Lab 03/18/20 1747 03/19/20 0624 03/19/20 1118 03/20/20 0414 03/21/20 0500 03/22/20 0503 03/23/20 0650 03/24/20 0349 03/25/20 0435  WBC 7.4 9.2 11.0*   < > 9.0 5.8 3.7* 2.9* 2.9*  NEUTROABS 6.0 8.4* 10.1*  --   --   --   --   --   --   HGB 11.8* 11.9* 10.6*   < > 9.9* 9.7* 10.1* 9.8* 10.4*  HCT 35.4* 35.3* 31.9*   < > 28.8* 28.8* 30.2* 29.0* 31.0*  MCV 95.2 95.7 94.9   < > 93.8 94.7 94.7 95.4 96.9  PLT 8* 44* 38*   < > 35* 39* 42* 40* 39*   < > = values in this interval not displayed.   Basic Metabolic Panel: Recent Labs  Lab 03/19/20 0624 03/19/20 1118 03/19/20 1544 03/21/20 0500 03/22/20 0503 03/23/20 0650 03/24/20 0349 03/25/20 0435  NA  --  130*   < > 131* 136 135 136 135  K  --  4.5   < > 4.0 3.6 3.3* 3.6 3.6  CL  --  96*   < > 95* 96* 95* 98 98  CO2  --  16*   < > _0 GLUCOSE  --  165*   < > 100* 87 93 80 89  BUN  --  62*   < > 76* 70* 65* 69* 69*  CREATININE  --  3.87*   < > 4.42* 4.29* 4.22* 4.36* 4.16*  CALCIUM  --  8.1*   < > 8.1* 8.2* 7.9* 8.0* 7.8*  MG 2.1 2.2  --   --   --   --   --   --   PHOS 4.2 4.8*  --   --   --   --   --   --    < > = values in this interval not displayed.    GFR: Estimated Creatinine Clearance: 14.6 mL/min (A) (by C-G formula based on SCr of 4.16 mg/dL (H)). Liver Function Tests: Recent Labs  Lab 03/19/20 1118 03/20/20 0414  AST 21 17  ALT 12 11  ALKPHOS 53 47  BILITOT 1.0 0.9  PROT 5.0* 4.9*  ALBUMIN 3.1* 2.9*   No results for input(s): LIPASE, AMYLASE in the last 168 hours. No results for input(s): AMMONIA in the last 168 hours. Coagulation Profile: No results for input(s): INR, PROTIME in the last 168 hours. Cardiac Enzymes: Recent Labs  Lab 03/19/20 0624  CKTOTAL 36*   BNP (last 3 results) No results for input(s): PROBNP in the last 8760 hours. HbA1C: No results for input(s): HGBA1C in the last 72 hours. CBG: No results for input(s): GLUCAP in the last 168 hours. Lipid Profile: No results for input(s): CHOL, HDL, LDLCALC, TRIG, CHOLHDL, LDLDIRECT in the last 72 hours. Thyroid Function Tests: No results for input(s): TSH, T4TOTAL, FREET4, T3FREE, THYROIDAB in the last 72 hours. Anemia Panel: No results for input(s): VITAMINB12, FOLATE, FERRITIN, TIBC, IRON, RETICCTPCT in the last 72 hours. Sepsis Labs: Recent Labs  Lab 03/19/20 8469  LATICACIDVEN 1.7    Recent Results (from the past 240 hour(s))  Urine culture     Status: Abnormal   Collection Time: 03/18/20  6:57 PM   Specimen: Urine, Random  Result Value Ref Range Status   Specimen Description   Final    URINE, RANDOM Performed at [facility] [address], [place], [place] [address]    Special Requests   Final    NONE Performed at [facility], [place] [address], [place], [place] [address]    Culture (A)  Final    <10,000 COLONIES/mL INSIGNIFICANT GROWTH Performed at [facility] [address] [place], [place] [address]    Report Status 03/20/2020 FINAL  Final  SARS CORONAVIRUS 2 (TAT 6-24 HRS) Nasopharyngeal Nasopharyngeal Swab     Status: Abnormal   Collection Time: 03/19/20  2:04 AM   Specimen:  Nasopharyngeal Swab  Result Value Ref Range Status   SARS Coronavirus 2 POSITIVE (A) NEGATIVE Final    Comment: (NOTE) SARS-CoV-2 target nucleic acids are DETECTED.  The SARS-CoV-2 RNA is generally detectable in upper and lower respiratory specimens during the acute phase of infection. Positive results are indicative of the presence of SARS-CoV-2 RNA. Clinical correlation with patient history and other diagnostic information is  necessary to determine patient infection status. Positive results do not rule out bacterial infection or co-infection with other viruses.  The expected result is Negative.  Fact Sheet for Patients: SugarRoll.be  Fact Sheet for Healthcare Providers: https://www.woods-mathews.com/  This test is not yet approved or cleared by the [place] FDA and  has been authorized for detection and/or diagnosis of SARS-CoV-2 by FDA under an Emergency Use Authorization (EUA). This EUA will remain  in effect (meaning this test can be used) for the duration of the COVID-19 declaration under Section 564(b)(1) of the Act, 21 U. S.C. section 360bbb-3(b)(1), unless the authorization is terminated or revoked sooner.   Performed at [facility], [place] [address] [place], [place] [address]   Stool culture (children & immunocomp patients)     Status: None   Collection Time: 03/19/20 10:52 AM   Specimen: Perirectal; Stool  Result Value Ref Range Status   Salmonella/Shigella Screen NSTCUL  Final    Comment: (NOTE) Test not performed. No stool culture transport device received.      Emani R. was notified 03/20/2020.    Campylobacter Culture NSTCUL  Final    Comment: (NOTE) Test not performed. No stool culture transport device received.      Emani R. was  notified 03/20/2020.    E coli, Shiga toxin Assay NSTCUL  Final    Comment: (NOTE) Test not performed. No stool culture transport device received.      Emani R. was notified  03/20/2020. Performed At: [facility] [place], [place] [id number] Rush Farmer MD [id number]   Gastrointestinal Panel by PCR , Stool     Status: None   Collection Time: 03/19/20 10:52 AM   Specimen: Perirectal; Stool  Result Value Ref Range Status   Campylobacter species NOT DETECTED NOT DETECTED Final   Plesimonas shigelloides NOT DETECTED NOT DETECTED Final   Salmonella species NOT DETECTED NOT DETECTED Final   Yersinia enterocolitica NOT DETECTED NOT DETECTED Final   Vibrio species NOT DETECTED NOT DETECTED Final   Vibrio cholerae NOT DETECTED NOT DETECTED Final   Enteroaggregative E coli (EAEC) NOT DETECTED NOT DETECTED Final   Enteropathogenic E coli (EPEC) NOT DETECTED NOT DETECTED Final   Enterotoxigenic E coli (ETEC) NOT DETECTED NOT DETECTED Final   Shiga like toxin producing E coli (STEC) NOT DETECTED NOT DETECTED Final   Shigella/Enteroinvasive E coli (EIEC) NOT DETECTED NOT DETECTED Final   Cryptosporidium NOT DETECTED NOT DETECTED Final   Cyclospora cayetanensis NOT DETECTED NOT DETECTED Final   Entamoeba histolytica NOT DETECTED NOT DETECTED Final   Giardia lamblia NOT DETECTED NOT DETECTED Final   Adenovirus F40/41 NOT DETECTED NOT DETECTED Final   Astrovirus NOT DETECTED NOT DETECTED Final   Norovirus GI/GII NOT DETECTED NOT DETECTED Final   Rotavirus A NOT DETECTED NOT DETECTED Final   Sapovirus (I, II, IV, and V) NOT DETECTED NOT DETECTED Final    Comment: Performed at [facility], [address] [place], [place] [address]      Radiology Studies: No results found.    Scheduled Meds: . acyclovir  200 mg Oral BID  . amLODipine  2.5 mg Oral Daily  . carvedilol  6.25 mg Oral BID WC  . Chlorhexidine Gluconate Cloth  6 each Topical Daily  . hydrALAZINE  100 mg Oral Q8H  . isosorbide mononitrate  30 mg Oral BID  . loratadine  10 mg Oral Daily  . sodium chloride flush  10-40 mL Intracatheter Q12H   Continuous  Infusions:    LOS: 7 days      Time spent: 25 minutes   Dessa Phi, DO [facility] 03/25/2020, 12:25 PM   Available via Epic secure chat 7am-7pm After these hours, please refer to coverage provider listed on [email or web address]

## 2020-03-25 NOTE — Progress Notes (Signed)
[facility] Progress Note  Subjective:  Patient not examined today directly given COVID-19 + status, utilizing data taken from chart +/- discussions w/ providers and staff.       Vitals:   03/24/20 1341 03/24/20 2125 03/25/20 0500 03/25/20 0501  BP: (!) 152/73 (!) 184/86  (!) 189/116  Pulse: (!) 47 (!) 50  81  Resp: '18 20  16  ' Temp: 98.8 F (37.1 C) 99.5 F (37.5 C)  98.5 F (36.9 C)  TempSrc: Oral Oral  Oral  SpO2: 95% 98%  98%  Weight:   89.4 kg   Height:        Exam:  Patient not examined today directly given COVID-19 + status, utilizing data taken from chart +/- discussions w/ providers and staff.      Home meds:  - lasix 40 qd/ metoprolol 25 bid  - prilosec 20/ decadron 91m q 7d/ zovirax 200 bid  - chemoRx = Cytoxan, decadron, carfilzomib     UA 11-20 rbc, 0-5 wbc, rare bact, > 300 prot    UNa < 10, UCr 143    Renal [place]/25 -  IMPRESSION: No hydronephrosis. Numerous bilateral renal cysts. Increased echogenicity likely reflecting chronic medical renal disease.      ECHO 1/24 - LVEF 25-30%      CXR - IMPRESSION: Shallow inspiration with probable small left and trace right pleural effusions. No significant interval change since the prior Radiograph       CT abd - Adrenals/Urinary Tract: Polycystic morphology of the kidneys with replacement of the majority of the renal parenchyma by cysts. Evaluation of the renal cyst is limited on this noncontrast CT. There is no hydronephrosis or nephrolithiasis on either side. The urinary bladder is grossly unremarkable.                      Date                Creat               eGFR         2019- 01/2020     1.15- 1.45        32- 50 ml/min          Jan 7                   1.17                 51, IIIa         Mar 14, 2020       1.24                 47, IIIa         Jan 23                 3.31                 15         Jan 24                 3.52         Jan 25                 4.43                  10  Assessment/ Plan: 1. AKI on CKD 3b - b/l creat 1.1- 1.4 in Dec 2021, eGFR 32- 50. Creat  3.3 on admit, peaked at 4.4.  UA w/ rbc's and proteinuria. UPC ratio is 1.8. Renal US w/o signs of obstruction. UA w/ low Na. Haptoglobin low. Likely cause of AKI include dehydration, myeloma kidney and TMA d/t chemo.  Gave pt IVF"s w/ minimal improvement, creat stuck 4- 4.5 range, IVF's dc'd. Good urine output, no signs of uremia. Will arrange for renal f/u in our office. Will sign off.  2. COVID infection - asymptomatic, per pmd 3. Acute syst CHF - LVEF 25-30%, no vol overload on exam, cardiology seeing 4. Multiple myeloma - dx'd in 2019, w/o remission - was getting cytoxan/ carfilzomib/ steroids, on hold for now. F/b Onc.  5. Thrombocytopenia - plts stable 30- 50k. Has been low previously, prob chemo-related. Per Hem-Onc.  6. Anemia - Hb 10's 7. HTN - getting norvasc, BP's slightly up   Kelly Splinter, MD 03/25/2020, 10:01 AM    Recent Labs  Lab 03/19/20 0624 03/19/20 1118 03/19/20 1544 03/24/20 0349 03/25/20 0435  K  --  4.5   < > 3.6 3.6  BUN  --  62*   < > 69* 69*  CREATININE  --  3.87*   < > 4.36* 4.16*  CALCIUM  --  8.1*   < > 8.0* 7.8*  PHOS 4.2 4.8*  --   --   --   HGB 11.9* 10.6*   < > 9.8* 10.4*   < > = values in this interval not displayed.   Inpatient medications: . acyclovir  200 mg Oral BID  . amLODipine  2.5 mg Oral Daily  . carvedilol  3.125 mg Oral BID WC  . Chlorhexidine Gluconate Cloth  6 each Topical Daily  . hydrALAZINE  75 mg Oral Q8H  . isosorbide mononitrate  30 mg Oral BID  . loratadine  10 mg Oral Daily  . sodium chloride flush  10-40 mL Intracatheter Q12H   . sodium chloride 50 mL/hr at 03/25/20 0532   acetaminophen **OR** acetaminophen, hydrALAZINE, HYDROcodone-acetaminophen, melatonin, sodium chloride, sodium chloride flush

## 2020-03-26 LAB — BASIC METABOLIC PANEL
Anion gap: 12 (ref 5–15)
BUN: 67 mg/dL — ABNORMAL HIGH (ref 8–23)
CO2: 24 mmol/L (ref 22–32)
Calcium: 8.1 mg/dL — ABNORMAL LOW (ref 8.9–10.3)
Chloride: 98 mmol/L (ref 98–111)
Creatinine, Ser: 4.34 mg/dL — ABNORMAL HIGH (ref 0.44–1.00)
GFR, Estimated: 10 mL/min — ABNORMAL LOW (ref >60)
Glucose, Bld: 84 mg/dL (ref 70–99)
Potassium: 3.8 mmol/L (ref 3.5–5.1)
Sodium: 134 mmol/L — ABNORMAL LOW (ref 135–145)

## 2020-03-26 LAB — CBC
HCT: 28.8 % — ABNORMAL LOW (ref 36.0–46.0)
Hemoglobin: 9.6 g/dL — ABNORMAL LOW (ref 12.0–15.0)
MCH: 32 pg (ref 26.0–34.0)
MCHC: 33.3 g/dL (ref 30.0–36.0)
MCV: 96 fL (ref 80.0–100.0)
Platelets: 37 10*3/uL — ABNORMAL LOW (ref 150–400)
RBC: 3 MIL/uL — ABNORMAL LOW (ref 3.87–5.11)
RDW: 16.9 % — ABNORMAL HIGH (ref 11.5–15.5)
WBC: 2.9 10*3/uL — ABNORMAL LOW (ref 4.0–10.5)
nRBC: 0 % (ref 0.0–0.2)

## 2020-03-26 NOTE — Progress Notes (Signed)
PROGRESS NOTE    Llewellyn Schoenberger  EXN:[id number] DOB: 03-Jul-1950 DOA: 03/18/2020 PCP: [name], MD     Brief Narrative:  Debbie Chan is a 70 year old female with past medical history significant for multiple myeloma, atrial flutter, chronic anemia who presented to the hospital with complaints of weakness.  She was found to have severe thrombocytopenia and AKI.  She was given IV fluid, platelet transfusions.  She was also incidentally found to have COVID-19. Hospitalization complicated by renal failure as well as heart failure.   New events last 24 hours / Subjective: Patient without any new complaints, denies shortness of breath.  Had some epistaxis couple days ago which resolved spontaneously.  Assessment & Plan:   Active Problems:   Anemia   AKI (acute kidney injury) ([facility])   Atrial flutter (HCC)   Multiple myeloma without remission (HCC)   Thrombocytopenia (HCC)   Colitis   Hematuria   Polycystic kidney   Hyponatremia   Dehydration   AKI on CKD stage IIIb -Baseline creatinine 1.2 -Renal ultrasound without sign of obstruction -Could be secondary to dehydration, myeloma kidney, heart failure -Nephrology signed off 1/30, planning to follow up with patient in office in 2 weeks   Acute systolic heart failure -BNP 1809.1 --> 594.1 -Suspect cardiomyopathy could be secondary to cardiotoxic effects from carfilzomib  -Cardiology following -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  On room air.  CRP 0.9. Can be out of isolation after 10 days, starting 2/3   Thrombocytopenia -Presented with platelet 8000 on admission.  Transfused 1 unit platelets -Hematology/oncology following.  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  Transfuse for platelets <20,000 for acute bleeding -Follow CBC  Acute colitis -GI PCR negative -CT abdomen pelvis reveals colitis of the distal descending and  sigmoid colon -Completed antibiotic therapy  Multiple myeloma -Follows with [name]-Completed course of Decadron  Paroxysmal atrial flutter -CHA2DS2-VASc 4 -Anticoagulation currently on hold due to thrombocytopenia  Hypertension -Continue hydralazine, Imdur  Polycystic morphology kidneys -Follow-up outpatient    DVT prophylaxis:  SCDs Start: 03/19/20 1051  Code Status: Full code Family Communication: None at bedside; spoke with son over the phone Disposition Plan:  Status is: Inpatient  Remains inpatient appropriate because:Inpatient level of care appropriate due to severity of illness   Dispo: The patient is from: Home              Anticipated d/c is to: Home              Anticipated d/c date is: 2 days              Patient currently is not medically stable to d/c.  Await further cardiology recommendations for discharge.   Difficult to place patient No   Antimicrobials:  Anti-infectives (From admission, onward)   Start     Dose/Rate Route Frequency Ordered Stop   03/21/20 2200  amoxicillin-clavulanate (AUGMENTIN) 500-125 MG per tablet 500 mg  Status:  Discontinued        1 tablet Oral 2 times daily 03/21/20 1527 03/24/20 0729   03/19/20 2200  piperacillin-tazobactam (ZOSYN) IVPB 3.375 g  Status:  Discontinued        3.375 g 12.5 mL/hr over 240 Minutes Intravenous Every 12 hours 03/19/20 1020 03/21/20 1527   03/19/20 2200  acyclovir (ZOVIRAX) 200 MG capsule 200 mg        200 mg Oral 2 times daily 03/19/20 1611  03/19/20 0300  piperacillin-tazobactam (ZOSYN) IVPB 2.25 g        2.25 g 100 mL/hr over 30 Minutes Intravenous Every 8 hours 03/19/20 0228 03/19/20 1643       Objective: Vitals:   03/26/20 0500 03/26/20 0633 03/26/20 0830 03/26/20 1011  BP:  (!) 160/86 (!) 183/99 125/67  Pulse:  (!) 53 (!) 106   Resp:  20  20  Temp:  98.8 F (37.1 C)    TempSrc:  Oral    SpO2:  93%    Weight: 89.5 kg     Height:        Intake/Output Summary (Last 24  hours) at 03/26/2020 1230 Last data filed at 03/26/2020 0901 Gross per 24 hour  Intake 960 ml  Output 1350 ml  Net -390 ml   Filed Weights   03/24/20 0432 03/25/20 0500 03/26/20 0500  Weight: 88.4 kg 89.4 kg 89.5 kg    Examination: General exam: Appears calm and comfortable  Respiratory system: Clear to auscultation. Respiratory effort normal.  On room air Cardiovascular system: S1 & S2 heard, RRR. + Bilateral pretibial edema. Gastrointestinal system: Abdomen is nondistended, soft and nontender. Normal bowel sounds heard. Central nervous system: Alert and oriented. Non focal exam. Speech clear  Extremities: Symmetric in appearance bilaterally  Skin: No rashes, lesions or ulcers on exposed skin  Psychiatry: Judgement and insight appear stable. Mood & affect appropriate.    Data Reviewed: I have personally reviewed following labs and imaging studies  CBC: Recent Labs  Lab 03/22/20 0503 03/23/20 0650 03/24/20 0349 03/25/20 0435 03/26/20 0408  WBC 5.8 3.7* 2.9* 2.9* 2.9*  HGB 9.7* 10.1* 9.8* 10.4* 9.6*  HCT 28.8* 30.2* 29.0* 31.0* 28.8*  MCV 94.7 94.7 95.4 96.9 96.0  PLT 39* 42* 40* 39* 37*   Basic Metabolic Panel: Recent Labs  Lab 03/22/20 0503 03/23/20 0650 03/24/20 0349 03/25/20 0435 03/26/20 0408  NA 136 135 136 135 134*  K 3.6 3.3* 3.6 3.6 3.8  CL 96* 95* 98 98 98  CO2 _0 GLUCOSE 87 93 80 89 84  BUN 70* 65* 69* 69* 67*  CREATININE 4.29* 4.22* 4.36* 4.16* 4.34*  CALCIUM 8.2* 7.9* 8.0* 7.8* 8.1*   GFR: Estimated Creatinine Clearance: 14.1 mL/min (A) (by C-G formula based on SCr of 4.34 mg/dL (H)). Liver Function Tests: Recent Labs  Lab 03/20/20 0414  AST 17  ALT 11  ALKPHOS 47  BILITOT 0.9  PROT 4.9*  ALBUMIN 2.9*   No results for input(s): LIPASE, AMYLASE in the last 168 hours. No results for input(s): AMMONIA in the last 168 hours. Coagulation Profile: No results for input(s): INR, PROTIME in the last 168 hours. Cardiac Enzymes: No  results for input(s): CKTOTAL, CKMB, CKMBINDEX, TROPONINI in the last 168 hours. BNP (last 3 results) No results for input(s): PROBNP in the last 8760 hours. HbA1C: No results for input(s): HGBA1C in the last 72 hours. CBG: No results for input(s): GLUCAP in the last 168 hours. Lipid Profile: No results for input(s): CHOL, HDL, LDLCALC, TRIG, CHOLHDL, LDLDIRECT in the last 72 hours. Thyroid Function Tests: No results for input(s): TSH, T4TOTAL, FREET4, T3FREE, THYROIDAB in the last 72 hours. Anemia Panel: No results for input(s): VITAMINB12, FOLATE, FERRITIN, TIBC, IRON, RETICCTPCT in the last 72 hours. Sepsis Labs: No results for input(s): PROCALCITON, LATICACIDVEN in the last 168 hours.  Recent Results (from the past 240 hour(s))  Urine culture     Status: Abnormal   Collection  Time: 03/18/20  6:57 PM   Specimen: Urine, Random  Result Value Ref Range Status   Specimen Description   Final    URINE, RANDOM Performed at [facility] [address], [place], [place] [address]    Special Requests   Final    NONE Performed at [facility], [place] [address], [place], [place] [address]    Culture (A)  Final    <10,000 COLONIES/mL INSIGNIFICANT GROWTH Performed at [facility] [address] [place], [place] [address]    Report Status 03/20/2020 FINAL  Final  SARS CORONAVIRUS 2 (TAT 6-24 HRS) Nasopharyngeal Nasopharyngeal Swab     Status: Abnormal   Collection Time: 03/19/20  2:04 AM   Specimen: Nasopharyngeal Swab  Result Value Ref Range Status   SARS Coronavirus 2 POSITIVE (A) NEGATIVE Final    Comment: (NOTE) SARS-CoV-2 target nucleic acids are DETECTED.  The SARS-CoV-2 RNA is generally detectable in upper and lower respiratory specimens during the acute phase of infection. Positive results are indicative of the presence of SARS-CoV-2 RNA. Clinical correlation with patient history and other diagnostic information is  necessary to  determine patient infection status. Positive results do not rule out bacterial infection or co-infection with other viruses.  The expected result is Negative.  Fact Sheet for Patients: SugarRoll.be  Fact Sheet for Healthcare Providers: https://www.woods-mathews.com/  This test is not yet approved or cleared by the [place] FDA and  has been authorized for detection and/or diagnosis of SARS-CoV-2 by FDA under an Emergency Use Authorization (EUA). This EUA will remain  in effect (meaning this test can be used) for the duration of the COVID-19 declaration under Section 564(b)(1) of the Act, 21 U. S.C. section 360bbb-3(b)(1), unless the authorization is terminated or revoked sooner.   Performed at [facility], [place] [address] [place], [place] [address]   Stool culture (children & immunocomp patients)     Status: None   Collection Time: 03/19/20 10:52 AM   Specimen: Perirectal; Stool  Result Value Ref Range Status   Salmonella/Shigella Screen NSTCUL  Final    Comment: (NOTE) Test not performed. No stool culture transport device received.      Emani R. was notified 03/20/2020.    Campylobacter Culture NSTCUL  Final    Comment: (NOTE) Test not performed. No stool culture transport device received.      Emani R. was notified 03/20/2020.    E coli, Shiga toxin Assay NSTCUL  Final    Comment: (NOTE) Test not performed. No stool culture transport device received.      Emani R. was notified 03/20/2020. Performed At: [facility] [place], [place] [id number] Rush Farmer MD QQ:[id number]   Gastrointestinal Panel by PCR , Stool     Status: None   Collection Time: 03/19/20 10:52 AM   Specimen: Perirectal; Stool  Result Value Ref Range Status   Campylobacter species NOT DETECTED NOT DETECTED Final   Plesimonas shigelloides NOT DETECTED NOT DETECTED Final   Salmonella species NOT DETECTED NOT DETECTED Final    Yersinia enterocolitica NOT DETECTED NOT DETECTED Final   Vibrio species NOT DETECTED NOT DETECTED Final   Vibrio cholerae NOT DETECTED NOT DETECTED Final   Enteroaggregative E coli (EAEC) NOT DETECTED NOT DETECTED Final   Enteropathogenic E coli (EPEC) NOT DETECTED NOT DETECTED Final   Enterotoxigenic E coli (ETEC) NOT DETECTED NOT DETECTED Final   Shiga like toxin producing E coli (STEC) NOT DETECTED NOT  DETECTED Final   Shigella/Enteroinvasive E coli (EIEC) NOT DETECTED NOT DETECTED Final   Cryptosporidium NOT DETECTED NOT DETECTED Final   Cyclospora cayetanensis NOT DETECTED NOT DETECTED Final   Entamoeba histolytica NOT DETECTED NOT DETECTED Final   Giardia lamblia NOT DETECTED NOT DETECTED Final   Adenovirus F40/41 NOT DETECTED NOT DETECTED Final   Astrovirus NOT DETECTED NOT DETECTED Final   Norovirus GI/GII NOT DETECTED NOT DETECTED Final   Rotavirus A NOT DETECTED NOT DETECTED Final   Sapovirus (I, II, IV, and V) NOT DETECTED NOT DETECTED Final    Comment: Performed at [facility], [address] [place], [place] [address]      Radiology Studies: No results found.    Scheduled Meds: . acyclovir  200 mg Oral BID  . amLODipine  2.5 mg Oral Daily  . carvedilol  6.25 mg Oral BID WC  . Chlorhexidine Gluconate Cloth  6 each Topical Daily  . hydrALAZINE  100 mg Oral Q8H  . isosorbide mononitrate  30 mg Oral BID  . loratadine  10 mg Oral Daily  . sodium chloride flush  10-40 mL Intracatheter Q12H   Continuous Infusions:    LOS: 8 days      Time spent: 25 minutes   Dessa Phi, DO [facility] 03/26/2020, 12:30 PM   Available via Epic secure chat 7am-7pm After these hours, please refer to coverage provider listed on [email or web address]

## 2020-03-26 NOTE — Plan of Care (Signed)
  Problem: Education: Goal: Knowledge of risk factors and measures for prevention of condition will improve Outcome: Progressing   Problem: Coping: Goal: Psychosocial and spiritual needs will be supported Outcome: Progressing   Problem: Respiratory: Goal: Will maintain a patent airway Outcome: Progressing Goal: Complications related to the disease process, condition or treatment will be avoided or minimized Outcome: Progressing   

## 2020-03-26 NOTE — Progress Notes (Signed)
Patient's daughter-inlaw and son updated on plan of care.

## 2020-03-26 NOTE — Progress Notes (Addendum)
Progress Note  Patient Name: Debbie Chan Date of Encounter: 03/26/2020  [facility] Cardiologist: Glori Bickers, MD   Subjective   Feeling well.  No chest pain.  +cough  Inpatient Medications    Scheduled Meds: . acyclovir  200 mg Oral BID  . amLODipine  2.5 mg Oral Daily  . carvedilol  6.25 mg Oral BID WC  . Chlorhexidine Gluconate Cloth  6 each Topical Daily  . hydrALAZINE  100 mg Oral Q8H  . isosorbide mononitrate  30 mg Oral BID  . loratadine  10 mg Oral Daily  . sodium chloride flush  10-40 mL Intracatheter Q12H   Continuous Infusions:  PRN Meds: acetaminophen **OR** acetaminophen, hydrALAZINE, HYDROcodone-acetaminophen, melatonin, sodium chloride, sodium chloride flush   Vital Signs    Vitals:   03/25/20 2029 03/26/20 0500 03/26/20 0633 03/26/20 0830  BP: (!) 184/101  (!) 160/86 (!) 183/99  Pulse: 94  (!) 53 (!) 106  Resp: 20  20   Temp: 98.6 F (37 C)  98.8 F (37.1 C)   TempSrc: Oral  Oral   SpO2: 97%  93%   Weight:  89.5 kg    Height:        Intake/Output Summary (Last 24 hours) at 03/26/2020 0928 Last data filed at 03/26/2020 0901 Gross per 24 hour  Intake 1700 ml  Output 1751 ml  Net -51 ml   Last 3 Weights 03/26/2020 03/25/2020 03/24/2020  Weight (lbs) 197 lb 5 oz 197 lb 1.5 oz 194 lb 14.2 oz  Weight (kg) 89.5 kg 89.4 kg 88.4 kg      Telemetry    Sinus rhythm.  PVCs - Personally Reviewed  ECG    n/a - Personally Reviewed  Physical Exam   VS:  BP 125/67   Pulse (!) 106   Temp 98.8 F (37.1 C) (Oral)   Resp 20   Ht 5' 7" (1.702 m)   Wt 89.5 kg   SpO2 93%   BMI 30.90 kg/m  , BMI Body mass index is 30.9 kg/m. GENERAL:  Well appearing HEENT: Pupils equal round and reactive, fundi not visualized, oral mucosa unremarkable NECK:  No jugular venous distention, waveform within normal limits, carotid upstroke brisk and symmetric, no bruits LUNGS:  Poor air movement.  Mild rhonchi HEART:  RRR.  PMI not displaced or sustained,S1 and  S2 within normal limits, no S3, no S4, no clicks, no rubs, II/VI systolic murmur ABD:  Flat, positive bowel sounds normal in frequency in pitch, no bruits, no rebound, no guarding, no midline pulsatile mass, no hepatomegaly, no splenomegaly EXT:  2 plus pulses throughout, no edema, no cyanosis no clubbing SKIN:  No rashes no nodules NEURO:  Cranial nerves II through XII grossly intact, motor grossly intact throughout PSYCH:  Cognitively intact, oriented to person place and time   Labs    High Sensitivity Troponin:  No results for input(s): TROPONINIHS in the last 720 hours.    Chemistry Recent Labs  Lab 03/19/20 1118 03/19/20 1544 03/20/20 0414 03/21/20 0500 03/24/20 0349 03/25/20 0435 03/26/20 0408  NA 130*   < > 130*   < > 136 135 134*  K 4.5   < > 4.7   < > 3.6 3.6 3.8  CL 96*   < > 100   < > 98 98 98  CO2 16*   < > 16*   < > _0 GLUCOSE 165*   < > 127*   < > 80 89  84  BUN 62*   < > 71*   < > 69* 69* 67*  CREATININE 3.87*   < > 4.43*   < > 4.36* 4.16* 4.34*  CALCIUM 8.1*   < > 8.4*   < > 8.0* 7.8* 8.1*  PROT 5.0*  --  4.9*  --   --   --   --   ALBUMIN 3.1*  --  2.9*  --   --   --   --   AST 21  --  17  --   --   --   --   ALT 12  --  11  --   --   --   --   ALKPHOS 53  --  47  --   --   --   --   BILITOT 1.0  --  0.9  --   --   --   --   GFRNONAA 12*   < > 10*   < > 10* 11* 10*  ANIONGAP 18*   < > 14   < > 12 12 12   < > = values in this interval not displayed.     Hematology Recent Labs  Lab 03/24/20 0349 03/25/20 0435 03/26/20 0408  WBC 2.9* 2.9* 2.9*  RBC 3.04* 3.20* 3.00*  HGB 9.8* 10.4* 9.6*  HCT 29.0* 31.0* 28.8*  MCV 95.4 96.9 96.0  MCH 32.2 32.5 32.0  MCHC 33.8 33.5 33.3  RDW 17.0* 16.8* 16.9*  PLT 40* 39* 37*    BNP Recent Labs  Lab 03/19/20 1715 03/21/20 1235  BNP 1,809.1* 594.1*     DDimer No results for input(s): DDIMER in the last 168 hours.   Radiology    No results found.  Cardiac Studies   03/19/20 echo  1. Left  ventricular ejection fraction, by estimation, is 25 to 30%. The  left ventricle has severely decreased function. The left ventricle  demonstrates global hypokinesis although the septal LV segments appear  slightly worse. Left ventricular diastolic  parameters are consistent with Grade II diastolic dysfunction  (pseudonormalization).  2. Right ventricular systolic function is mildly reduced. The right  ventricular size is moderately enlarged. There is moderately elevated  pulmonary artery systolic pressure.  3. The mitral valve is grossly normal. Mild mitral valve regurgitation.  4. Right atrial size was massively dilated.  5. Tricuspid valve regurgitation is moderate to severe.  6. The aortic valve is tricuspid. There is mild calcification of the  aortic valve. There is mild thickening of the aortic valve. Aortic valve  regurgitation is mild.  7. Aortic dilatation noted. 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  8. The inferior vena cava is normal in size with greater than 50%  respiratory variability, suggesting right atrial pressure of 3 mmHg.   Patient Profile     69 y.o. female with multiple myeloma not in remission, status post 11 cycles of carfilzomib and cyclophosphamide, history of paroxysmal atrial flutter 2019, history of severe tricuspid regurgitation and right heart enlargement, admitted for suspected infectious colitis, acute renal insufficiency and severe thrombocytopenia and found to have severely depressed left ventricular systolic function, new compared to 2019.  Assessment & Plan    1. Acute combined CHF: found to have EF 25-30% on echo this admission. Etiology remains unclear, though suspicions are high for chemotherapy induced cardiomyopathy in the setting of carfilzomib use. Symptoms of CHF preceded COVID-19 infection and no other symptoms to suggest myocarditis.   No anginal complaints to suggest CAD, though anticipate an outpatient  lexiscan myoview to r/o ischemia outpatient. She also had evidence of chronic right heart failure with dilated RA on prior echo in 2019 and evidence of severe TR on echo this admission. Not overtly decompensated, though hydralazine and carvedilol increased yesterday in the setting of increased AVNRT episodes and mild LE edema and JVD. GDMT has been limited by AoCKD. Plan was to hold off on diuretics unless evidence of left heart failure symptoms.  - Consider RHC following completion of her COVID-19 isolation to better assess volume status (2/3) - Continue carvedilol, hydralazine, and imdur - Continue to monitor volume status closely with strict I&Os and daily weights - No LHC given CKD and lack of ischemic symptoms  2. Paroxysmal atrial flutter: no clear evidence this admission. Anticoagulation on hold given worsening renal function and thrombocytopenia. Intermediate to high embolic risk with a CHA2DS2-VASc score of 4 (CHF, HTN, female, and age 65-74) - May need to consider alternative anticoagulation option with warfarin if renal function does not return to baseline and PLT count stabilizes.  - Continue carvedilol for rate control.   3. 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 Carvedilol increased yesterday with no recurrence thus far.  - Continue carvedilol  4. HTN: BP still elevated with occasional BP to 180s/100s despite addition of amlodipine 2.5mg daily, carvedilol 6.25mg BID, and hydralazine 100mg TID. Her last BP this am was 125/67.  Will continue to monitor.  5. IgA multiple myeloma: concerned current chemotherapy is causing cardiotoxicity.  - Will need to discuss alternative treatment options with her oncologist.   6. AoCKD stage 3: Cr was 1.2 1/19, however has peaked at 4.43 this admission without significant improvement though stable at 4.34 today. Nephrology is following and suspect AKI was 2/2 dehydration,  myeloma kidney, and TMA d/t chemo.  - Continue to monitor closely   7. Thrombocytopenia: PLT count 8 on admission, improved post-transfusion and currently stable in the 30s-40s over the past several days. No complaints of bleeding - Continue to monitor closely      For questions or updates, please contact [facility] Please consult [email or web address] for contact info under        Signed, Tiffany C. Jennings, MD, FACC 03/26/2020 12:21 PM   

## 2020-03-27 LAB — BASIC METABOLIC PANEL
Anion gap: 10 (ref 5–15)
BUN: 78 mg/dL — ABNORMAL HIGH (ref 8–23)
CO2: 25 mmol/L (ref 22–32)
Calcium: 8 mg/dL — ABNORMAL LOW (ref 8.9–10.3)
Chloride: 99 mmol/L (ref 98–111)
Creatinine, Ser: 4.99 mg/dL — ABNORMAL HIGH (ref 0.44–1.00)
GFR, Estimated: 9 mL/min — ABNORMAL LOW (ref >60)
Glucose, Bld: 86 mg/dL (ref 70–99)
Potassium: 3.9 mmol/L (ref 3.5–5.1)
Sodium: 134 mmol/L — ABNORMAL LOW (ref 135–145)

## 2020-03-27 LAB — CBC
HCT: 25.3 % — ABNORMAL LOW (ref 36.0–46.0)
Hemoglobin: 8.5 g/dL — ABNORMAL LOW (ref 12.0–15.0)
MCH: 32.1 pg (ref 26.0–34.0)
MCHC: 33.6 g/dL (ref 30.0–36.0)
MCV: 95.5 fL (ref 80.0–100.0)
Platelets: 38 10*3/uL — ABNORMAL LOW (ref 150–400)
RBC: 2.65 MIL/uL — ABNORMAL LOW (ref 3.87–5.11)
RDW: 17.1 % — ABNORMAL HIGH (ref 11.5–15.5)
WBC: 2.5 10*3/uL — ABNORMAL LOW (ref 4.0–10.5)
nRBC: 0 % (ref 0.0–0.2)

## 2020-03-27 MED ORDER — CARVEDILOL 6.25 MG PO TABS
6.2500 mg | ORAL_TABLET | Freq: Once | ORAL | Status: AC
  Administered 2020-03-27: 6.25 mg via ORAL
  Filled 2020-03-27: qty 1

## 2020-03-27 MED ORDER — CARVEDILOL 12.5 MG PO TABS
12.5000 mg | ORAL_TABLET | Freq: Two times a day (BID) | ORAL | Status: DC
  Administered 2020-03-27 – 2020-04-04 (×17): 12.5 mg via ORAL
  Filled 2020-03-27 (×17): qty 1

## 2020-03-27 NOTE — Progress Notes (Signed)
PROGRESS NOTE    Debbie Chan  ZHG:[id number] DOB: May 12, 1950 DOA: 03/18/2020 PCP: [name], MD     Brief Narrative:  Debbie Chan is a 70 year old female with past medical history significant for multiple myeloma, atrial flutter, chronic anemia who presented to the hospital with complaints of weakness.  She was found to have severe thrombocytopenia and AKI.  She was given IV fluid, platelet transfusions.  She was also incidentally found to have COVID-19. Hospitalization complicated by renal failure as well as heart failure. Nephrology and cardiology have been following.   New events last 24 hours / Subjective: No new complaints today, sitting at the side of bed.   Assessment & Plan:   Active Problems:   Anemia   AKI (acute kidney injury) ([place])   Atrial flutter (HCC)   Multiple myeloma without remission (HCC)   Thrombocytopenia (HCC)   Colitis   Hematuria   Polycystic kidney   Hyponatremia   Dehydration   AKI on CKD stage IIIb -Baseline creatinine 1.2 -Renal ultrasound without sign of obstruction -Could be secondary to dehydration, myeloma kidney, heart failure -Nephrology signed off 1/30, planning to follow up with patient in office in 2 weeks   Acute systolic heart failure -BNP 1809.1 --> 594.1 -Suspect cardiomyopathy could be secondary to cardiotoxic effects from carfilzomib  -Cardiology following -Continue coreg   COVID-19 -Incidental finding, tested positive 1/24.  On room air.  CRP 0.9. Can be out of isolation after 10 days, starting 2/3   Thrombocytopenia -Presented with platelet 8000 on admission.  Transfused 1 unit platelets -Hematology/oncology following.  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  Transfuse for platelets <20,000 for acute bleeding -Follow CBC  Acute colitis -GI PCR negative -CT abdomen pelvis reveals colitis of the distal descending and sigmoid colon -Completed antibiotic  therapy  Multiple myeloma -Follows with [name]-Completed course of Decadron  Paroxysmal atrial flutter -CHA2DS2-VASc 4 -Anticoagulation currently on hold due to thrombocytopenia  Hypertension -Continue hydralazine, Imdur  Polycystic morphology kidneys -Follow-up outpatient    DVT prophylaxis:  SCDs Start: 03/19/20 1051  Code Status: Full code Family Communication: None at bedside; spoke with son over the phone 1/31 Disposition Plan:  Status is: Inpatient  Remains inpatient appropriate because:Inpatient level of care appropriate due to severity of illness   Dispo: The patient is from: Home              Anticipated d/c is to: Home              Anticipated d/c date is: 2 days              Patient currently is not medically stable to d/c.  Await further cardiology recommendations for discharge.   Difficult to place patient No   Antimicrobials:  Anti-infectives (From admission, onward)   Start     Dose/Rate Route Frequency Ordered Stop   03/21/20 2200  amoxicillin-clavulanate (AUGMENTIN) 500-125 MG per tablet 500 mg  Status:  Discontinued        1 tablet Oral 2 times daily 03/21/20 1527 03/24/20 0729   03/19/20 2200  piperacillin-tazobactam (ZOSYN) IVPB 3.375 g  Status:  Discontinued        3.375 g 12.5 mL/hr over 240 Minutes Intravenous Every 12 hours 03/19/20 1020 03/21/20 1527   03/19/20 2200  acyclovir (ZOVIRAX) 200 MG capsule 200 mg        200 mg Oral 2 times daily 03/19/20 1611     03/19/20 0300  piperacillin-tazobactam (ZOSYN) IVPB 2.25 g        2.25 g 100 mL/hr over 30 Minutes Intravenous Every 8 hours 03/19/20 0228 03/19/20 1643       Objective: Vitals:   03/26/20 1900 03/26/20 2200 03/27/20 0556 03/27/20 0916  BP: 135/73 (!) 142/70 (!) 156/90 (!) 175/89  Pulse: 89 92 96 97  Resp:  20 20   Temp:  100 F (37.8 C) 100 F (37.8 C)   TempSrc:  Oral Oral   SpO2:  95% 100%   Weight:      Height:        Intake/Output Summary (Last 24 hours) at  03/27/2020 1059 Last data filed at 03/27/2020 2233 Gross per 24 hour  Intake 360 ml  Output 900 ml  Net -540 ml   Filed Weights   03/24/20 0432 03/25/20 0500 03/26/20 0500  Weight: 88.4 kg 89.4 kg 89.5 kg    Examination: General exam: Appears calm and comfortable  Respiratory system: Clear to auscultation. Respiratory effort normal. Cardiovascular system: S1 & S2 heard, RRR. +1 pedal edema. Gastrointestinal system: Abdomen is nondistended, soft and nontender. Normal bowel sounds heard. Central nervous system: Alert and oriented. Non focal exam. Speech clear  Extremities: Symmetric in appearance bilaterally  Skin: No rashes, lesions or ulcers on exposed skin  Psychiatry: Judgement and insight appear stable. Mood & affect appropriate.    Data Reviewed: I have personally reviewed following labs and imaging studies  CBC: Recent Labs  Lab 03/23/20 0650 03/24/20 0349 03/25/20 0435 03/26/20 0408 03/27/20 0428  WBC 3.7* 2.9* 2.9* 2.9* 2.5*  HGB 10.1* 9.8* 10.4* 9.6* 8.5*  HCT 30.2* 29.0* 31.0* 28.8* 25.3*  MCV 94.7 95.4 96.9 96.0 95.5  PLT 42* 40* 39* 37* 38*   Basic Metabolic Panel: Recent Labs  Lab 03/23/20 0650 03/24/20 0349 03/25/20 0435 03/26/20 0408 03/27/20 0428  NA 135 136 135 134* 134*  K 3.3* 3.6 3.6 3.8 3.9  CL 95* 98 98 98 99  CO2 _0 GLUCOSE 93 80 89 84 86  BUN 65* 69* 69* 67* 78*  CREATININE 4.22* 4.36* 4.16* 4.34* 4.99*  CALCIUM 7.9* 8.0* 7.8* 8.1* 8.0*   GFR: Estimated Creatinine Clearance: 12.2 mL/min (A) (by C-G formula based on SCr of 4.99 mg/dL (H)). Liver Function Tests: No results for input(s): AST, ALT, ALKPHOS, BILITOT, PROT, ALBUMIN in the last 168 hours. No results for input(s): LIPASE, AMYLASE in the last 168 hours. No results for input(s): AMMONIA in the last 168 hours. Coagulation Profile: No results for input(s): INR, PROTIME in the last 168 hours. Cardiac Enzymes: No results for input(s): CKTOTAL, CKMB, CKMBINDEX,  TROPONINI in the last 168 hours. BNP (last 3 results) No results for input(s): PROBNP in the last 8760 hours. HbA1C: No results for input(s): HGBA1C in the last 72 hours. CBG: No results for input(s): GLUCAP in the last 168 hours. Lipid Profile: No results for input(s): CHOL, HDL, LDLCALC, TRIG, CHOLHDL, LDLDIRECT in the last 72 hours. Thyroid Function Tests: No results for input(s): TSH, T4TOTAL, FREET4, T3FREE, THYROIDAB in the last 72 hours. Anemia Panel: No results for input(s): VITAMINB12, FOLATE, FERRITIN, TIBC, IRON, RETICCTPCT in the last 72 hours. Sepsis Labs: No results for input(s): PROCALCITON, LATICACIDVEN in the last 168 hours.  Recent Results (from the past 240 hour(s))  Urine culture     Status: Abnormal   Collection Time: 03/18/20  6:57 PM   Specimen: Urine, Random  Result Value Ref Range Status  Specimen Description   Final    URINE, RANDOM Performed at [facility], [place] [address] [place], [place] [address]    Special Requests   Final    NONE Performed at [facility], [place] [address], [place], [place] [address]    Culture (A)  Final    <10,000 COLONIES/mL INSIGNIFICANT GROWTH Performed at [place] [address] [place], [place] [address]    Report Status 03/20/2020 FINAL  Final  SARS CORONAVIRUS 2 (TAT 6-24 HRS) Nasopharyngeal Nasopharyngeal Swab     Status: Abnormal   Collection Time: 03/19/20  2:04 AM   Specimen: Nasopharyngeal Swab  Result Value Ref Range Status   SARS Coronavirus 2 POSITIVE (A) NEGATIVE Final    Comment: (NOTE) SARS-CoV-2 target nucleic acids are DETECTED.  The SARS-CoV-2 RNA is generally detectable in upper and lower respiratory specimens during the acute phase of infection. Positive results are indicative of the presence of SARS-CoV-2 RNA. Clinical correlation with patient history and other diagnostic information is  necessary to determine patient infection status. Positive  results do not rule out bacterial infection or co-infection with other viruses.  The expected result is Negative.  Fact Sheet for Patients: SugarRoll.be  Fact Sheet for Healthcare Providers: https://www.woods-mathews.com/  This test is not yet approved or cleared by the [place] FDA and  has been authorized for detection and/or diagnosis of SARS-CoV-2 by FDA under an Emergency Use Authorization (EUA). This EUA will remain  in effect (meaning this test can be used) for the duration of the COVID-19 declaration under Section 564(b)(1) of the Act, 21 U. S.C. section 360bbb-3(b)(1), unless the authorization is terminated or revoked sooner.   Performed at [facility], [place] [address] [place], [place] [address]   Stool culture (children & immunocomp patients)     Status: None   Collection Time: 03/19/20 10:52 AM   Specimen: Perirectal; Stool  Result Value Ref Range Status   Salmonella/Shigella Screen NSTCUL  Final    Comment: (NOTE) Test not performed. No stool culture transport device received.      Emani R. was notified 03/20/2020.    Campylobacter Culture NSTCUL  Final    Comment: (NOTE) Test not performed. No stool culture transport device received.      Emani R. was notified 03/20/2020.    E coli, Shiga toxin Assay NSTCUL  Final    Comment: (NOTE) Test not performed. No stool culture transport device received.      Emani R. was notified 03/20/2020. Performed At: [facility] [place], [place] [id number] Rush Farmer MD MQ:[id number]   Gastrointestinal Panel by PCR , Stool     Status: None   Collection Time: 03/19/20 10:52 AM   Specimen: Perirectal; Stool  Result Value Ref Range Status   Campylobacter species NOT DETECTED NOT DETECTED Final   Plesimonas shigelloides NOT DETECTED NOT DETECTED Final   Salmonella species NOT DETECTED NOT DETECTED Final   Yersinia enterocolitica NOT DETECTED NOT  DETECTED Final   Vibrio species NOT DETECTED NOT DETECTED Final   Vibrio cholerae NOT DETECTED NOT DETECTED Final   Enteroaggregative E coli (EAEC) NOT DETECTED NOT DETECTED Final   Enteropathogenic E coli (EPEC) NOT DETECTED NOT DETECTED Final   Enterotoxigenic E coli (ETEC) NOT DETECTED NOT DETECTED Final   Shiga like toxin producing E coli (STEC) NOT DETECTED NOT DETECTED Final   Shigella/Enteroinvasive E coli (EIEC) NOT DETECTED NOT DETECTED Final   Cryptosporidium NOT DETECTED  NOT DETECTED Final   Cyclospora cayetanensis NOT DETECTED NOT DETECTED Final   Entamoeba histolytica NOT DETECTED NOT DETECTED Final   Giardia lamblia NOT DETECTED NOT DETECTED Final   Adenovirus F40/41 NOT DETECTED NOT DETECTED Final   Astrovirus NOT DETECTED NOT DETECTED Final   Norovirus GI/GII NOT DETECTED NOT DETECTED Final   Rotavirus A NOT DETECTED NOT DETECTED Final   Sapovirus (I, II, IV, and V) NOT DETECTED NOT DETECTED Final    Comment: Performed at [facility], [address], [place], [place] [address]      Radiology Studies: No results found.    Scheduled Meds: . acyclovir  200 mg Oral BID  . amLODipine  2.5 mg Oral Daily  . carvedilol  12.5 mg Oral BID WC  . carvedilol  6.25 mg Oral Once  . Chlorhexidine Gluconate Cloth  6 each Topical Daily  . hydrALAZINE  100 mg Oral Q8H  . isosorbide mononitrate  30 mg Oral BID  . loratadine  10 mg Oral Daily  . sodium chloride flush  10-40 mL Intracatheter Q12H   Continuous Infusions:    LOS: 9 days      Time spent: 25 minutes   Dessa Phi, DO [facility] 03/27/2020, 10:59 AM   Available via Epic secure chat 7am-7pm After these hours, please refer to coverage provider listed on [email or web address]

## 2020-03-27 NOTE — Plan of Care (Signed)
  Problem: Education: Goal: Knowledge of risk factors and measures for prevention of condition will improve Outcome: Progressing   Problem: Coping: Goal: Psychosocial and spiritual needs will be supported Outcome: Progressing   Problem: Respiratory: Goal: Will maintain a patent airway Outcome: Progressing Goal: Complications related to the disease process, condition or treatment will be avoided or minimized Outcome: Progressing   

## 2020-03-27 NOTE — Progress Notes (Incomplete)
Progress Note  Patient Name: Debbie Chan Date of Encounter: 03/27/2020  [facility] Cardiologist: Glori Bickers, MD ***  Subjective   ***  Inpatient Medications    Scheduled Meds: . acyclovir  200 mg Oral BID  . amLODipine  2.5 mg Oral Daily  . carvedilol  6.25 mg Oral BID WC  . Chlorhexidine Gluconate Cloth  6 each Topical Daily  . hydrALAZINE  100 mg Oral Q8H  . isosorbide mononitrate  30 mg Oral BID  . loratadine  10 mg Oral Daily  . sodium chloride flush  10-40 mL Intracatheter Q12H   Continuous Infusions:  PRN Meds: acetaminophen **OR** acetaminophen, hydrALAZINE, HYDROcodone-acetaminophen, melatonin, sodium chloride, sodium chloride flush   Vital Signs    Vitals:   03/26/20 1900 03/26/20 2200 03/27/20 0556 03/27/20 0916  BP: 135/73 (!) 142/70 (!) 156/90 (!) 175/89  Pulse: 89 92 96 97  Resp:  20 20   Temp:  100 F (37.8 C) 100 F (37.8 C)   TempSrc:  Oral Oral   SpO2:  95% 100%   Weight:      Height:        Intake/Output Summary (Last 24 hours) at 03/27/2020 0942 Last data filed at 03/27/2020 5003 Gross per 24 hour  Intake 360 ml  Output 900 ml  Net -540 ml   Last 3 Weights 03/26/2020 03/25/2020 03/24/2020  Weight (lbs) 197 lb 5 oz 197 lb 1.5 oz 194 lb 14.2 oz  Weight (kg) 89.5 kg 89.4 kg 88.4 kg      Telemetry    *** - Personally Reviewed  ECG    No new tracings - Personally Reviewed  Physical Exam  *** GEN: No acute distress.   Neck: No JVD Cardiac: RRR, no murmurs, rubs, or gallops.  Respiratory: Clear to auscultation bilaterally. GI: Soft, nontender, non-distended  MS: No edema; No deformity. Neuro:  Nonfocal  Psych: Normal affect   Labs    High Sensitivity Troponin:  No results for input(s): TROPONINIHS in the last 720 hours.    Chemistry Recent Labs  Lab 03/25/20 0435 03/26/20 0408 03/27/20 0428  NA 135 134* 134*  K 3.6 3.8 3.9  CL 98 98 99  CO2 _0 GLUCOSE 89 84 86  BUN 69* 67* 78*  CREATININE 4.16* 4.34*  4.99*  CALCIUM 7.8* 8.1* 8.0*  GFRNONAA 11* 10* 9*  ANIONGAP _1 Hematology Recent Labs  Lab 03/25/20 0435 03/26/20 0408 03/27/20 0428  WBC 2.9* 2.9* 2.5*  RBC 3.20* 3.00* 2.65*  HGB 10.4* 9.6* 8.5*  HCT 31.0* 28.8* 25.3*  MCV 96.9 96.0 95.5  MCH 32.5 32.0 32.1  MCHC 33.5 33.3 33.6  RDW 16.8* 16.9* 17.1*  PLT 39* 37* 38*    BNP Recent Labs  Lab 03/21/20 1235  BNP 594.1*     DDimer No results for input(s): DDIMER in the last 168 hours.   Radiology    No results found.  Cardiac Studies    03/19/20 echo  1. Left ventricular ejection fraction, by estimation, is 25 to 30%. The  left ventricle has severely decreased function. The left ventricle  demonstrates global hypokinesis although the septal LV segments appear  slightly worse. Left ventricular diastolic  parameters are consistent with Grade II diastolic dysfunction  (pseudonormalization).  2. Right ventricular systolic function is mildly reduced. The right  ventricular size is moderately enlarged. There is moderately elevated  pulmonary artery systolic pressure.  3. The mitral valve is  grossly normal. Mild mitral valve regurgitation.  4. Right atrial size was massively dilated.  5. Tricuspid valve regurgitation is moderate to severe.  6. The aortic valve is tricuspid. There is mild calcification of the  aortic valve. There is mild thickening of the aortic valve. Aortic valve  regurgitation is mild.  7. Aortic dilatation noted. 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  8. The inferior vena cava is normal in size with greater than 50%  respiratory variability, suggesting right atrial pressure of 3 mmHg.   Patient Profile     70 y.o.femalewith multiple myeloma not in remission, status post 11 cycles of carfilzomib and cyclophosphamide, history of paroxysmal atrial flutter 2019, history of severe tricuspid regurgitation and right heart enlargement, admitted for  suspected infectious colitis, acute renal insufficiency and severe thrombocytopenia and found to have severely depressed left ventricular systolic function, new compared to 2019.   Assessment & Plan    1. Acute combined CHF: found to have EF 25-30% on echo this admission. Etiology remains unclear, though suspicions are high for chemotherapy induced cardiomyopathy in the setting of carfilzomib use. Symptoms of CHF preceded COVID-19 infection and no other symptoms to suggest myocarditis. No anginal complaints to suggest CAD, though anticipate an outpatient lexiscan myoview to r/o ischemia outpatient. She also had evidence of chronic right heart failure with dilated RA on prior echo in 2019 and evidence of severe TR on echo this admission. Not overtly decompensated, though hydralazine and carvedilol increased yesterday in the setting of increased AVNRT episodes and mild LE edema and JVD. GDMT has been limited by AoCKD. Plan was to hold off on diuretics unless evidence of left heart failure symptoms.  - Consider RHC following completion of her COVID-19 isolation to better assess volume status (2/3) - Continue carvedilol, hydralazine, and imdur - Continue to monitor volume status closely with strict I&Os and daily weights - No LHC given CKD and lack of ischemic symptoms  2. Paroxysmal atrial flutter: no clear evidence this admission. Anticoagulation on hold given worsening renal function and thrombocytopenia. Intermediate to high embolic risk with a YBO1BP1-WCHE score of 4 (CHF, HTN, female, and age 73-74) - May need to consider alternative anticoagulation option with warfarin if renal function does not return to baseline and PLT count stabilizes.  - Continue carvedilol for rate control.   3. 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 Carvedilol increased yesterday with no recurrence thus far.  - Continue carvedilol  4. HTN: BP  still elevated with occasional BP to 180s/100s despite addition of amlodipine 2.49m daily, carvedilol 6.27mBID, and hydralazine 10053mID. - Will increase carvedilol to 12.5mg22mD this morning and monitor for response  5. IgA multiple myeloma: concerned current chemotherapy is causing cardiotoxicity.  - Will need to discuss alternative treatment options with her oncologist.   6. AoCKD stage 3: Cr was 1.2 1/19, however new peak of 4.99 today, up from 4.34 yesterday. Nephrology is following and suspect AKI was 2/2 dehydration, myeloma kidney, and TMA d/t chemo.  - Continue to monitor closely   7. Thrombocytopenia: PLT count 8 on admission, improved post-transfusion and currently stable in the 30s-40s over the past several days. No complaints of bleeding - Continue to monitor closely  {Are we signing off today?:[id number]}  For questions or updates, please contact [email or web address] consult [email or web address] for contact info under  Signed, Abigail Butts, PA-C  03/27/2020, 9:42 AM

## 2020-03-28 ENCOUNTER — Other Ambulatory Visit: Payer: Self-pay

## 2020-03-28 ENCOUNTER — Inpatient Hospital Stay: Payer: Self-pay

## 2020-03-28 ENCOUNTER — Inpatient Hospital Stay: Payer: Self-pay | Admitting: Internal Medicine

## 2020-03-28 ENCOUNTER — Ambulatory Visit: Payer: Self-pay

## 2020-03-28 LAB — BASIC METABOLIC PANEL
Anion gap: 11 (ref 5–15)
BUN: 75 mg/dL — ABNORMAL HIGH (ref 8–23)
CO2: 24 mmol/L (ref 22–32)
Calcium: 7.9 mg/dL — ABNORMAL LOW (ref 8.9–10.3)
Chloride: 99 mmol/L (ref 98–111)
Creatinine, Ser: 5.15 mg/dL — ABNORMAL HIGH (ref 0.44–1.00)
GFR, Estimated: 9 mL/min — ABNORMAL LOW (ref >60)
Glucose, Bld: 96 mg/dL (ref 70–99)
Potassium: 4.1 mmol/L (ref 3.5–5.1)
Sodium: 134 mmol/L — ABNORMAL LOW (ref 135–145)

## 2020-03-28 LAB — CBC
HCT: 24.5 % — ABNORMAL LOW (ref 36.0–46.0)
Hemoglobin: 8.1 g/dL — ABNORMAL LOW (ref 12.0–15.0)
MCH: 32 pg (ref 26.0–34.0)
MCHC: 33.1 g/dL (ref 30.0–36.0)
MCV: 96.8 fL (ref 80.0–100.0)
Platelets: 42 10*3/uL — ABNORMAL LOW (ref 150–400)
RBC: 2.53 MIL/uL — ABNORMAL LOW (ref 3.87–5.11)
RDW: 17 % — ABNORMAL HIGH (ref 11.5–15.5)
WBC: 2.8 10*3/uL — ABNORMAL LOW (ref 4.0–10.5)
nRBC: 0 % (ref 0.0–0.2)

## 2020-03-28 NOTE — Progress Notes (Signed)
PROGRESS NOTE  Debbie Chan WNU:[id number] DOB: 07/03/1950 DOA: 03/18/2020 PCP: [name], MD  HPI/Recap of past 24 hours: Debbie Chan is a 70 year old female with past medical history significant for multiple myeloma, atrial flutter, chronic anemia who presented to the hospital with complaints of weakness.  She was found to have severe thrombocytopenia and AKI.  She was given IV fluid, platelet transfusions.  She was also incidentally found to have COVID-19. Hospitalization complicated by renal failure as well as heart failure. Nephrology and cardiology have been following.   03/28/20: Seen and examined at the bedside. Reports persistent cough. Denies chest pain or dyspnea at rest..  Assessment/Plan: Active Problems:   Anemia   Acute renal failure superimposed on stage 3b chronic kidney disease (HCC)   Atrial flutter (HCC)   Multiple myeloma without remission (HCC)   Thrombocytopenia (HCC)   Colitis   Hematuria   Polycystic kidney   Hyponatremia   Dehydration  AKI on CKD stage IIIb likely multifactorial secondary to dehydration, myeloma kidney, heart failure. -Baseline creatinine 1.2 -Renal ultrasound without sign of obstruction -Could be secondary to dehydration, myeloma kidney, heart failure -Nephrology signed off 1/30, planning to follow up with patient in office in 2 weeks  Creatinine uptrending 5.15 from 4.99. Continue to avoid nephrotoxins, dehydration and hypotension. Monitor urine output. Repeat renal panel in the morning.  Acute systolic heart failure -BNP 1809.1 --> 594.1 -Suspect cardiomyopathy could be secondary to cardiotoxic effects from carfilzomib  -Cardiology following -Continue coreg   COVID-19 -Incidental finding, tested positive 1/24.  On room air.  CRP 0.9. Can be out of isolation after 10 days, starting 2/3 O2 saturation 100% on room air.  Thrombocytopenia -Presented with platelet 8000 on admission.  Transfused 1 unit  platelets -Hematology/oncology following.  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  Transfuse for platelets <20,000 for acute bleeding -Platelet count 42K, uptrending.  Acute colitis -GI PCR negative -CT abdomen pelvis reveals colitis of the distal descending and sigmoid colon -Completed antibiotic therapy  Multiple myeloma -Follows with [name]-Completed course of Decadron  Paroxysmal atrial flutter -CHA2DS2-VASc 4 -Anticoagulation currently on hold due to thrombocytopenia  Hypertension -Continue hydralazine, Imdur  Polycystic morphology kidneys -Follow-up outpatient    DVT prophylaxis:  SCDs Start: 03/19/20 1051  Code Status: Full code Family Communication:  None at bedside.  Status is: Inpatient    Dispo: The patient is from: Home.              Anticipated d/c is to: Home.              Anticipated d/c date is: 03/29/2020.              Patient currently not stable for discharge.   Difficult to place patient N/A        Objective: Vitals:   03/27/20 2135 03/28/20 0406 03/28/20 0600 03/28/20 1333  BP: 139/87 (!) 146/82  120/64  Pulse: 87 91  86  Resp: (!) '22 20  20  ' Temp: 98.6 F (37 C) 99.5 F (37.5 C)  98.7 F (37.1 C)  TempSrc: Oral Oral    SpO2:    100%  Weight:   94.5 kg   Height:        Intake/Output Summary (Last 24 hours) at 03/28/2020 1740 Last data filed at 03/28/2020 1607 Gross per 24 hour  Intake 120 ml  Output 1100 ml  Net -980 ml   Filed Weights   03/25/20 0500  03/26/20 0500 03/28/20 0600  Weight: 89.4 kg 89.5 kg 94.5 kg    Exam:  . General: 70 y.o. year-old female well developed well nourished in no acute distress.  Alert and oriented x3. . Cardiovascular: Regular rate and rhythm with no rubs or gallops.  No thyromegaly or JVD noted.   Marland Kitchen Respiratory: Clear to auscultation with no wheezes or rales. Good inspiratory effort. . Abdomen: Soft nontender nondistended with normal  bowel sounds x4 quadrants. . Musculoskeletal: No lower extremity edema. 2/4 pulses in all 4 extremities. . Skin: No ulcerative lesions noted or rashes . Psychiatry: Mood is appropriate for condition and setting   Data Reviewed: CBC: Recent Labs  Lab 03/24/20 0349 03/25/20 0435 03/26/20 0408 03/27/20 0428 03/28/20 0015  WBC 2.9* 2.9* 2.9* 2.5* 2.8*  HGB 9.8* 10.4* 9.6* 8.5* 8.1*  HCT 29.0* 31.0* 28.8* 25.3* 24.5*  MCV 95.4 96.9 96.0 95.5 96.8  PLT 40* 39* 37* 38* 42*   Basic Metabolic Panel: Recent Labs  Lab 03/24/20 0349 03/25/20 0435 03/26/20 0408 03/27/20 0428 03/28/20 0015  NA 136 135 134* 134* 134*  K 3.6 3.6 3.8 3.9 4.1  CL 98 98 98 99 99  CO2 '26 25 24 25 24  ' GLUCOSE 80 89 84 86 96  BUN 69* 69* 67* 78* 75*  CREATININE 4.36* 4.16* 4.34* 4.99* 5.15*  CALCIUM 8.0* 7.8* 8.1* 8.0* 7.9*   GFR: Estimated Creatinine Clearance: 12.2 mL/min (A) (by C-G formula based on SCr of 5.15 mg/dL (H)). Liver Function Tests: No results for input(s): AST, ALT, ALKPHOS, BILITOT, PROT, ALBUMIN in the last 168 hours. No results for input(s): LIPASE, AMYLASE in the last 168 hours. No results for input(s): AMMONIA in the last 168 hours. Coagulation Profile: No results for input(s): INR, PROTIME in the last 168 hours. Cardiac Enzymes: No results for input(s): CKTOTAL, CKMB, CKMBINDEX, TROPONINI in the last 168 hours. BNP (last 3 results) No results for input(s): PROBNP in the last 8760 hours. HbA1C: No results for input(s): HGBA1C in the last 72 hours. CBG: No results for input(s): GLUCAP in the last 168 hours. Lipid Profile: No results for input(s): CHOL, HDL, LDLCALC, TRIG, CHOLHDL, LDLDIRECT in the last 72 hours. Thyroid Function Tests: No results for input(s): TSH, T4TOTAL, FREET4, T3FREE, THYROIDAB in the last 72 hours. Anemia Panel: No results for input(s): VITAMINB12, FOLATE, FERRITIN, TIBC, IRON, RETICCTPCT in the last 72 hours. Urine analysis:    Component Value  Date/Time   COLORURINE YELLOW 03/18/2020 1747   APPEARANCEUR HAZY (A) 03/18/2020 1747   LABSPEC 1.016 03/18/2020 1747   PHURINE 5.0 03/18/2020 1747   GLUCOSEU NEGATIVE 03/18/2020 1747   HGBUR LARGE (A) 03/18/2020 1747   BILIRUBINUR NEGATIVE 03/18/2020 1747   KETONESUR NEGATIVE 03/18/2020 1747   PROTEINUR >=300 (A) 03/18/2020 1747   NITRITE NEGATIVE 03/18/2020 1747   LEUKOCYTESUR NEGATIVE 03/18/2020 1747   Sepsis Labs: '@LABRCNTIP' (procalcitonin:4,lacticidven:4)  ) Recent Results (from the past 240 hour(s))  Urine culture     Status: Abnormal   Collection Time: 03/18/20  6:57 PM   Specimen: Urine, Random  Result Value Ref Range Status   Specimen Description   Final    URINE, RANDOM Performed at [facility], [place] [address] [place], [place] [address]    Special Requests   Final    NONE Performed at [facility], [place] [address], [place], [place] [address]    Culture (A)  Final    <10,000 COLONIES/mL INSIGNIFICANT GROWTH Performed at [facility], [address], [place], [place] [address]    Report Status 03/20/2020 FINAL  Final  SARS CORONAVIRUS 2 (TAT 6-24 HRS) Nasopharyngeal Nasopharyngeal Swab     Status: Abnormal   Collection Time: 03/19/20  2:04 AM   Specimen: Nasopharyngeal Swab  Result Value Ref Range Status   SARS Coronavirus 2 POSITIVE (A) NEGATIVE Final    Comment: (NOTE) SARS-CoV-2 target nucleic acids are DETECTED.  The SARS-CoV-2 RNA is generally detectable in upper and lower respiratory specimens during the acute phase of infection. Positive results are indicative of the presence of SARS-CoV-2 RNA. Clinical correlation with patient history and other diagnostic information is  necessary to determine patient infection status. Positive results do not rule out bacterial infection or co-infection with other viruses.  The expected result is Negative.  Fact Sheet for  Patients: SugarRoll.be  Fact Sheet for Healthcare Providers: https://www.woods-mathews.com/  This test is not yet approved or cleared by the [place] FDA and  has been authorized for detection and/or diagnosis of SARS-CoV-2 by FDA under an Emergency Use Authorization (EUA). This EUA will remain  in effect (meaning this test can be used) for the duration of the COVID-19 declaration under Section 564(b)(1) of the Act, 21 U. S.C. section 360bbb-3(b)(1), unless the authorization is terminated or revoked sooner.   Performed at [facility], [name] [address] [place], [place] [address]   Stool culture (children & immunocomp patients)     Status: None   Collection Time: 03/19/20 10:52 AM   Specimen: Perirectal; Stool  Result Value Ref Range Status   Salmonella/Shigella Screen NSTCUL  Final    Comment: (NOTE) Test not performed. No stool culture transport device received.      Emani R. was notified 03/20/2020.    Campylobacter Culture NSTCUL  Final    Comment: (NOTE) Test not performed. No stool culture transport device received.      Emani R. was notified 03/20/2020.    E coli, Shiga toxin Assay NSTCUL  Final    Comment: (NOTE) Test not performed. No stool culture transport device received.      Emani R. was notified 03/20/2020. Performed At: [facility] [place], [place] [id number] Rush Farmer MD IF:[id number]   Gastrointestinal Panel by PCR , Stool     Status: None   Collection Time: 03/19/20 10:52 AM   Specimen: Perirectal; Stool  Result Value Ref Range Status   Campylobacter species NOT DETECTED NOT DETECTED Final   Plesimonas shigelloides NOT DETECTED NOT DETECTED Final   Salmonella species NOT DETECTED NOT DETECTED Final   Yersinia enterocolitica NOT DETECTED NOT DETECTED Final   Vibrio species NOT DETECTED NOT DETECTED Final   Vibrio cholerae NOT DETECTED NOT DETECTED Final   Enteroaggregative  E coli (EAEC) NOT DETECTED NOT DETECTED Final   Enteropathogenic E coli (EPEC) NOT DETECTED NOT DETECTED Final   Enterotoxigenic E coli (ETEC) NOT DETECTED NOT DETECTED Final   Shiga like toxin producing E coli (STEC) NOT DETECTED NOT DETECTED Final   Shigella/Enteroinvasive E coli (EIEC) NOT DETECTED NOT DETECTED Final   Cryptosporidium NOT DETECTED NOT DETECTED Final   Cyclospora cayetanensis NOT DETECTED NOT DETECTED Final   Entamoeba histolytica NOT DETECTED NOT DETECTED Final   Giardia lamblia NOT DETECTED NOT DETECTED Final   Adenovirus F40/41 NOT DETECTED NOT DETECTED Final   Astrovirus NOT DETECTED NOT DETECTED Final   Norovirus GI/GII NOT DETECTED NOT DETECTED Final   Rotavirus A NOT DETECTED NOT DETECTED Final  Sapovirus (I, II, IV, and V) NOT DETECTED NOT DETECTED Final    Comment: Performed at [facility], [address] [place], [place] [address]      Studies: No results found.  Scheduled Meds: . acyclovir  200 mg Oral BID  . amLODipine  2.5 mg Oral Daily  . carvedilol  12.5 mg Oral BID WC  . Chlorhexidine Gluconate Cloth  6 each Topical Daily  . hydrALAZINE  100 mg Oral Q8H  . isosorbide mononitrate  30 mg Oral BID  . loratadine  10 mg Oral Daily  . sodium chloride flush  10-40 mL Intracatheter Q12H    Continuous Infusions:   LOS: 10 days     Kayleen Memos, MD [facility] Pager [phone]  If 7PM-7AM, please contact night-coverage [email or web address] Password [facility] 03/28/2020, 5:40 PM

## 2020-03-28 NOTE — Plan of Care (Signed)
  Problem: Education: Goal: Knowledge of risk factors and measures for prevention of condition will improve Outcome: Progressing   Problem: Coping: Goal: Psychosocial and spiritual needs will be supported Outcome: Progressing   Problem: Respiratory: Goal: Will maintain a patent airway Outcome: Progressing Goal: Complications related to the disease process, condition or treatment will be avoided or minimized Outcome: Progressing   

## 2020-03-29 ENCOUNTER — Inpatient Hospital Stay (HOSPITAL_COMMUNITY): Payer: Self-pay

## 2020-03-29 ENCOUNTER — Inpatient Hospital Stay: Payer: Self-pay

## 2020-03-29 LAB — BASIC METABOLIC PANEL
Anion gap: 13 (ref 5–15)
BUN: 79 mg/dL — ABNORMAL HIGH (ref 8–23)
CO2: 23 mmol/L (ref 22–32)
Calcium: 8.4 mg/dL — ABNORMAL LOW (ref 8.9–10.3)
Chloride: 98 mmol/L (ref 98–111)
Creatinine, Ser: 5.29 mg/dL — ABNORMAL HIGH (ref 0.44–1.00)
GFR, Estimated: 8 mL/min — ABNORMAL LOW (ref >60)
Glucose, Bld: 143 mg/dL — ABNORMAL HIGH (ref 70–99)
Potassium: 3.7 mmol/L (ref 3.5–5.1)
Sodium: 134 mmol/L — ABNORMAL LOW (ref 135–145)

## 2020-03-29 LAB — CBC
HCT: 26.4 % — ABNORMAL LOW (ref 36.0–46.0)
Hemoglobin: 8.8 g/dL — ABNORMAL LOW (ref 12.0–15.0)
MCH: 32.1 pg (ref 26.0–34.0)
MCHC: 33.3 g/dL (ref 30.0–36.0)
MCV: 96.4 fL (ref 80.0–100.0)
Platelets: 59 10*3/uL — ABNORMAL LOW (ref 150–400)
RBC: 2.74 MIL/uL — ABNORMAL LOW (ref 3.87–5.11)
RDW: 17.2 % — ABNORMAL HIGH (ref 11.5–15.5)
WBC: 4.1 10*3/uL (ref 4.0–10.5)
nRBC: 0 % (ref 0.0–0.2)

## 2020-03-29 LAB — BRAIN NATRIURETIC PEPTIDE: B Natriuretic Peptide: 622.2 pg/mL — ABNORMAL HIGH (ref 0.0–100.0)

## 2020-03-29 NOTE — Plan of Care (Signed)
  Problem: Education: Goal: Knowledge of risk factors and measures for prevention of condition will improve Outcome: Progressing   Problem: Coping: Goal: Psychosocial and spiritual needs will be supported Outcome: Progressing   Problem: Respiratory: Goal: Will maintain a patent airway Outcome: Progressing Goal: Complications related to the disease process, condition or treatment will be avoided or minimized Outcome: Progressing   

## 2020-03-29 NOTE — Progress Notes (Signed)
Progress Note  Patient Name: Debbie Chan Date of Encounter: 03/29/2020  [facility] Cardiologist: Glori Bickers, MD   Subjective   Feeling well.  No chest pain. Able to walk around the room with out dyspnea.  Denies orthopnea or PND.  Inpatient Medications    Scheduled Meds: . acyclovir  200 mg Oral BID  . amLODipine  2.5 mg Oral Daily  . carvedilol  12.5 mg Oral BID WC  . Chlorhexidine Gluconate Cloth  6 each Topical Daily  . hydrALAZINE  100 mg Oral Q8H  . isosorbide mononitrate  30 mg Oral BID  . loratadine  10 mg Oral Daily  . sodium chloride flush  10-40 mL Intracatheter Q12H   Continuous Infusions:  PRN Meds: acetaminophen **OR** acetaminophen, hydrALAZINE, HYDROcodone-acetaminophen, melatonin, sodium chloride, sodium chloride flush   Vital Signs    Vitals:   03/28/20 2102 03/29/20 0458 03/29/20 0500 03/29/20 1334  BP: (!) 142/79 (!) 157/89  116/64  Pulse: 84 90  87  Resp: 20 20    Temp: (!) 97.5 F (36.4 C) 97.9 F (36.6 C)  97.7 F (36.5 C)  TempSrc: Oral Oral  Oral  SpO2: 96% 97%  97%  Weight:   90 kg   Height:        Intake/Output Summary (Last 24 hours) at 03/29/2020 1358 Last data filed at 03/29/2020 0339 Gross per 24 hour  Intake --  Output 1300 ml  Net -1300 ml   Last 3 Weights 03/29/2020 03/28/2020 03/26/2020  Weight (lbs) 198 lb 6.6 oz 208 lb 5.4 oz 197 lb 5 oz  Weight (kg) 90 kg 94.5 kg 89.5 kg      Telemetry    Sinus rhythm.  PVCs - Personally Reviewed  ECG    n/a - Personally Reviewed  Physical Exam   VS:  BP 116/64 (BP Location: Right Arm)   Pulse 87   Temp 97.7 F (36.5 C) (Oral)   Resp 20   Ht '5\' 7"'  (1.702 m)   Wt 90 kg   SpO2 97%   BMI 31.08 kg/m  , BMI Body mass index is 31.08 kg/m. GENERAL:  Well appearing HEENT: Pupils equal round and reactive, fundi not visualized, oral mucosa unremarkable NECK:  JVP 1 cm above clavicle sitting upright.  Waveform within normal limits, carotid upstroke brisk and symmetric, no  bruits LUNGS:  Poor air movement.  Mild crackles at bases HEART:  RRR.  PMI not displaced or sustained,S1 and S2 within normal limits, no S3, no S4, no clicks, no rubs, II/VI systolic murmur ABD:  Flat, positive bowel sounds normal in frequency in pitch, no bruits, no rebound, no guarding, no midline pulsatile mass, no hepatomegaly, no splenomegaly EXT:  2 plus pulses throughout, 2+ LE edema to lower tibia bilaterally, no cyanosis no clubbing SKIN:  No rashes no nodules NEURO:  Cranial nerves II through XII grossly intact, motor grossly intact throughout PSYCH:  Cognitively intact, oriented to person place and time   Labs    High Sensitivity Troponin:  No results for input(s): TROPONINIHS in the last 720 hours.    Chemistry Recent Labs  Lab 03/27/20 0428 03/28/20 0015 03/29/20 0918  NA 134* 134* 134*  K 3.9 4.1 3.7  CL 99 99 98  CO2 '25 24 23  ' GLUCOSE 86 96 143*  BUN 78* 75* 79*  CREATININE 4.99* 5.15* 5.29*  CALCIUM 8.0* 7.9* 8.4*  GFRNONAA 9* 9* 8*  ANIONGAP '10 11 13     ' Hematology Recent  Labs  Lab 03/27/20 0428 03/28/20 0015 03/29/20 0918  WBC 2.5* 2.8* 4.1  RBC 2.65* 2.53* 2.74*  HGB 8.5* 8.1* 8.8*  HCT 25.3* 24.5* 26.4*  MCV 95.5 96.8 96.4  MCH 32.1 32.0 32.1  MCHC 33.6 33.1 33.3  RDW 17.1* 17.0* 17.2*  PLT 38* 42* 59*    BNP No results for input(s): BNP, PROBNP in the last 168 hours.   DDimer No results for input(s): DDIMER in the last 168 hours.   Radiology    No results found.  Cardiac Studies   03/19/20 echo  1. Left ventricular ejection fraction, by estimation, is 25 to 30%. The  left ventricle has severely decreased function. The left ventricle  demonstrates global hypokinesis although the septal LV segments appear  slightly worse. Left ventricular diastolic  parameters are consistent with Grade II diastolic dysfunction  (pseudonormalization).  2. Right ventricular systolic function is mildly reduced. The right  ventricular size is  moderately enlarged. There is moderately elevated  pulmonary artery systolic pressure.  3. The mitral valve is grossly normal. Mild mitral valve regurgitation.  4. Right atrial size was massively dilated.  5. Tricuspid valve regurgitation is moderate to severe.  6. The aortic valve is tricuspid. There is mild calcification of the  aortic valve. There is mild thickening of the aortic valve. Aortic valve  regurgitation is mild.  7. Aortic dilatation noted. 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  8. The inferior vena cava is normal in size with greater than 50%  respiratory variability, suggesting right atrial pressure of 3 mmHg.   Patient Profile     70 y.o. female with multiple myeloma not in remission, status post 11 cycles of carfilzomib and cyclophosphamide, history of paroxysmal atrial flutter 2019, history of severe tricuspid regurgitation and right heart enlargement, admitted for suspected infectious colitis, acute renal insufficiency and severe thrombocytopenia and found to have severely depressed left ventricular systolic function, new compared to 2019.  Assessment & Plan    # Acute systolic and diastolic CHF:  # HTN: f Found to have EF 25-30% on echo this admission.  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 Symptoms of CHF preceded COVID-19 infection and no other symptoms to suggest myocarditis. No anginal complaints to suggest CAD.  Consider outpatient Lexiscan Myoview given her renal dysfunction. She also had evidence of chronic right heart failure with dilated RA on prior echo in 2019 and evidence of severe TR on echo this admission.  Carvedilol increased  in the setting of increased AVNRT episodes.  She does appear to be more volume overloaded, though with worsening renal function will wait on diuresis.  Check BNP and CXR.  Continue carvedilol, hydralazine and Imdur.  Amlodipine was used due to lack of  options in the setting of renal dysfunction.  #. Paroxysmal atrial flutter: Maintaining sinus rhythm. Anticoagulation on hold given worsening renal function and thrombocytopenia. I ntermediate to high embolic risk with a [id number] score of 4 (CHF, HTN, female, and age 73-74).  Continue carvedilol for rate control.   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 Carvedilol increased this admission with no recurrence.  # IgA multiple myeloma:  # AoCKD stage 3:  Concerned current chemotherapy is causing cardiotoxicity.  There is concern that this is also causing her renal dysfunction.  Nephrology signed off but renal function continues to cry.  She is also getting increasingly volume overloaded.  I'm worried that if she is discharged she may develop respiratory symptoms.  It would be helpful for them to weigh in on whether we can safely diurese.  Checking a BNP and chest x-ray as above.  Avoiding nephrotoxins  # Thrombocytopenia: PLT count 8 on admission, improved post-transfusion and currently up to 59.     For questions or updates, please contact [place] Please consult [email or web address] for contact info under        Signed, Parnell Spieler C. Oval Linsey, MD, [facility] 03/29/2020 1:58 PM

## 2020-03-29 NOTE — Progress Notes (Addendum)
PROGRESS NOTE  Debbie Chan YSA:[id number] DOB: 1950-05-16 DOA: 03/18/2020 PCP: [name], MD  HPI/Recap of past 24 hours: Debbie Chan is a 70 year old female with past medical history significant for multiple myeloma, atrial flutter, chronic anemia who presented to the hospital with complaints of weakness.  She was found to have severe thrombocytopenia and AKI.  She was given IV fluid, platelet transfusions.  She was also incidentally found to have COVID-19. Hospitalization complicated by renal failure as well as heart failure. Nephrology and cardiology have been following.   03/29/20: Seen and examined.  Bilateral lower extremity edema.  Advised to elevate extremities, she is receptive.  Worsening renal function, discussed with nephrology Dr. Joelyn Oms  Assessment/Plan: Active Problems:   Anemia   Acute renal failure superimposed on stage 3b chronic kidney disease (HCC)   Atrial flutter (HCC)   Multiple myeloma without remission (HCC)   Thrombocytopenia (HCC)   Colitis   Hematuria   Polycystic kidney   Hyponatremia   Dehydration  Worsening nonoliguric AKI on CKD stage IIIb likely multifactorial secondary to dehydration, myeloma kidney, heart failure. -Baseline creatinine 1.2 -Renal ultrasound without sign of obstruction -Could be secondary to dehydration, myeloma kidney, heart failure -Nephrology signed off 1/30, planning to follow up with patient in office in 2 weeks  Creatinine uptrending 5.29 from 5.15 from 4.99. Continue to avoid nephrotoxins, dehydration and hypotension. Good urine output 1300 cc recorded last 24 hours. Repeat renal panel in the morning.  Acute systolic heart failure -BNP 1809.1 --> 594.1 -Suspect cardiomyopathy could be secondary to cardiotoxic effects from carfilzomib  -Cardiology following -Continue coreg   Bilateral lower extremity edema Elevate extremities.  COVID-19 -Incidental finding, tested positive 1/24.  On room air.  CRP 0.9. Can be  out of isolation after 10 days, starting 2/3 O2 saturation 100% on room air.  Thrombocytopenia -Presented with platelet 8000 on admission.  Transfused 1 unit platelets -Hematology/oncology following.  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  Transfuse for platelets <20,000 for acute bleeding -Platelet count 42K, uptrending.  Acute colitis -GI PCR negative -CT abdomen pelvis reveals colitis of the distal descending and sigmoid colon -Completed antibiotic therapy  Multiple myeloma -Follows with [name]-Completed course of Decadron  Paroxysmal atrial flutter -CHA2DS2-VASc 4 -Anticoagulation currently on hold due to thrombocytopenia  Hypertension -Continue hydralazine, Imdur  Polycystic morphology kidneys -Follow-up outpatient    DVT prophylaxis:  SCDs Start: 03/19/20 1051  Code Status: Full code Family Communication:  None at bedside.  Status is: Inpatient    Dispo: The patient is from: Home.              Anticipated d/c is to: Home.              Anticipated d/c date is: 03/30/2020.              Patient currently not stable for discharge.   Difficult to place patient N/A        Objective: Vitals:   03/28/20 2102 03/29/20 0458 03/29/20 0500 03/29/20 1334  BP: (!) 142/79 (!) 157/89  116/64  Pulse: 84 90  87  Resp: 20 20    Temp: (!) 97.5 F (36.4 C) 97.9 F (36.6 C)  97.7 F (36.5 C)  TempSrc: Oral Oral  Oral  SpO2: 96% 97%  97%  Weight:   90 kg   Height:        Intake/Output Summary (Last 24 hours) at 03/29/2020 13235Last data filed  at 03/29/2020 1700 Gross per 24 hour  Intake 480 ml  Output 1000 ml  Net -520 ml   Filed Weights   03/26/20 0500 03/28/20 0600 03/29/20 0500  Weight: 89.5 kg 94.5 kg 90 kg    Exam:  . General: 70 y.o. year-old female well-developed well-nourished in no acute distress.  Alert [place] x3. . Cardiovascular: Regular rate and rhythm no rubs or  gallops. Marland Kitchen Respiratory: Clear to auscultation no wheezes or rales.. . Abdomen: Soft nontender bowel sounds present. . Musculoskeletal: 2+ pitting edema in lower extremities bilaterally.   . Skin: No ulcerative lesions noted.   Marland Kitchen Psychiatry: Mood is appropriate for condition and setting.  Data Reviewed: CBC: Recent Labs  Lab 03/25/20 0435 03/26/20 0408 03/27/20 0428 03/28/20 0015 03/29/20 0918  WBC 2.9* 2.9* 2.5* 2.8* 4.1  HGB 10.4* 9.6* 8.5* 8.1* 8.8*  HCT 31.0* 28.8* 25.3* 24.5* 26.4*  MCV 96.9 96.0 95.5 96.8 96.4  PLT 39* 37* 38* 42* 59*   Basic Metabolic Panel: Recent Labs  Lab 03/25/20 0435 03/26/20 0408 03/27/20 0428 03/28/20 0015 03/29/20 0918  NA 135 134* 134* 134* 134*  K 3.6 3.8 3.9 4.1 3.7  CL 98 98 99 99 98  CO2 _0 GLUCOSE 89 84 86 96 143*  BUN 69* 67* 78* 75* 79*  CREATININE 4.16* 4.34* 4.99* 5.15* 5.29*  CALCIUM 7.8* 8.1* 8.0* 7.9* 8.4*   GFR: Estimated Creatinine Clearance: 11.6 mL/min (A) (by C-G formula based on SCr of 5.29 mg/dL (H)). Liver Function Tests: No results for input(s): AST, ALT, ALKPHOS, BILITOT, PROT, ALBUMIN in the last 168 hours. No results for input(s): LIPASE, AMYLASE in the last 168 hours. No results for input(s): AMMONIA in the last 168 hours. Coagulation Profile: No results for input(s): INR, PROTIME in the last 168 hours. Cardiac Enzymes: No results for input(s): CKTOTAL, CKMB, CKMBINDEX, TROPONINI in the last 168 hours. BNP (last 3 results) No results for input(s): PROBNP in the last 8760 hours. HbA1C: No results for input(s): HGBA1C in the last 72 hours. CBG: No results for input(s): GLUCAP in the last 168 hours. Lipid Profile: No results for input(s): CHOL, HDL, LDLCALC, TRIG, CHOLHDL, LDLDIRECT in the last 72 hours. Thyroid Function Tests: No results for input(s): TSH, T4TOTAL, FREET4, T3FREE, THYROIDAB in the last 72 hours. Anemia Panel: No results for input(s): VITAMINB12, FOLATE, FERRITIN, TIBC,  IRON, RETICCTPCT in the last 72 hours. Urine analysis:    Component Value Date/Time   COLORURINE YELLOW 03/18/2020 1747   APPEARANCEUR HAZY (A) 03/18/2020 1747   LABSPEC 1.016 03/18/2020 1747   PHURINE 5.0 03/18/2020 1747   GLUCOSEU NEGATIVE 03/18/2020 1747   HGBUR LARGE (A) 03/18/2020 1747   BILIRUBINUR NEGATIVE 03/18/2020 1747   KETONESUR NEGATIVE 03/18/2020 1747   PROTEINUR >=300 (A) 03/18/2020 1747   NITRITE NEGATIVE 03/18/2020 1747   LEUKOCYTESUR NEGATIVE 03/18/2020 1747   Sepsis Labs: _1 (procalcitonin:4,lacticidven:4)  ) No results found for this or any previous visit (from the past 240 hour(s)).    Studies: DG CHEST PORT 1 VIEW  Result Date: 03/29/2020 CLINICAL DATA:  Shortness of breath. EXAM: PORTABLE CHEST 1 VIEW COMPARISON:  March 21, 2020. FINDINGS: Stable cardiomegaly. Right internal jugular Port-A-Cath is unchanged in position. No pneumothorax is noted. Increased bibasilar opacities are noted concerning for worsening atelectasis or infiltrates with associated effusions. Bony thorax is unremarkable. IMPRESSION: Increased bibasilar opacities are noted concerning for worsening atelectasis or infiltrates with associated effusions. Electronically Signed   By: Sabino Dick  Jr M.D.   On: 03/29/2020 15:20    Scheduled Meds: . acyclovir  200 mg Oral BID  . amLODipine  2.5 mg Oral Daily  . carvedilol  12.5 mg Oral BID WC  . Chlorhexidine Gluconate Cloth  6 each Topical Daily  . hydrALAZINE  100 mg Oral Q8H  . isosorbide mononitrate  30 mg Oral BID  . loratadine  10 mg Oral Daily  . sodium chloride flush  10-40 mL Intracatheter Q12H    Continuous Infusions:   LOS: 11 days     Kayleen Memos, MD [facility] Pager [phone]  If 7PM-7AM, please contact night-coverage [email or web address] Password [facility] 03/29/2020, 6:52 PM

## 2020-03-29 NOTE — Plan of Care (Signed)
  Problem: Education: Goal: Knowledge of risk factors and measures for prevention of condition will improve Outcome: Adequate for Discharge   Problem: Coping: Goal: Psychosocial and spiritual needs will be supported Outcome: Adequate for Discharge   Problem: Respiratory: Goal: Will maintain a patent airway Outcome: Adequate for Discharge

## 2020-03-30 DIAGNOSIS — I5033 Acute on chronic diastolic (congestive) heart failure: Secondary | ICD-10-CM

## 2020-03-30 LAB — URINALYSIS, COMPLETE (UACMP) WITH MICROSCOPIC
Bilirubin Urine: NEGATIVE
Glucose, UA: NEGATIVE mg/dL
Ketones, ur: NEGATIVE mg/dL
Leukocytes,Ua: NEGATIVE
Nitrite: NEGATIVE
Protein, ur: 100 mg/dL — AB
Specific Gravity, Urine: 1.009 (ref 1.005–1.030)
pH: 6 (ref 5.0–8.0)

## 2020-03-30 LAB — BASIC METABOLIC PANEL
Anion gap: 13 (ref 5–15)
BUN: 77 mg/dL — ABNORMAL HIGH (ref 8–23)
CO2: 21 mmol/L — ABNORMAL LOW (ref 22–32)
Calcium: 8 mg/dL — ABNORMAL LOW (ref 8.9–10.3)
Chloride: 99 mmol/L (ref 98–111)
Creatinine, Ser: 5.26 mg/dL — ABNORMAL HIGH (ref 0.44–1.00)
GFR, Estimated: 8 mL/min — ABNORMAL LOW (ref >60)
Glucose, Bld: 96 mg/dL (ref 70–99)
Potassium: 3.9 mmol/L (ref 3.5–5.1)
Sodium: 133 mmol/L — ABNORMAL LOW (ref 135–145)

## 2020-03-30 LAB — CREATININE, URINE, RANDOM: Creatinine, Urine: 75.74 mg/dL

## 2020-03-30 LAB — SODIUM, URINE, RANDOM: Sodium, Ur: 25 mmol/L

## 2020-03-30 LAB — PROTEIN / CREATININE RATIO, URINE
Creatinine, Urine: 78.69 mg/dL
Protein Creatinine Ratio: 2.12 mg/mg{Cre} — ABNORMAL HIGH (ref 0.00–0.15)
Total Protein, Urine: 167 mg/dL

## 2020-03-30 MED ORDER — ALBUMIN HUMAN 25 % IV SOLN
12.5000 g | Freq: Once | INTRAVENOUS | Status: AC
  Administered 2020-03-30: 12.5 g via INTRAVENOUS
  Filled 2020-03-30: qty 50

## 2020-03-30 MED ORDER — FUROSEMIDE 10 MG/ML IJ SOLN
40.0000 mg | Freq: Once | INTRAMUSCULAR | Status: AC
  Administered 2020-03-30: 40 mg via INTRAVENOUS
  Filled 2020-03-30: qty 4

## 2020-03-30 NOTE — Consult Note (Signed)
Reason for Consult: Progressive CKD  Referring Physician: [place] Crane, DO  Debbie Chan is an 70 y.o. female.  HPI from initial consult performed by Dr. Jonnie Finner on 03/20/20: The patient is a 70 y.o. year-old w/ hx of multiple myeloma, atrial flutter, anemia presented w/ diarrhea, poor po intake and was found to have AKI and low platelets. Given IVF"s and plt transfusion. Found to be COVID + as well. ECHO showed new low EF 25-30% which is new. Pt is on carfilzomib and Cytoxan and decadron as Rx for myeloma. B/L creat is 1.2, was 3.3 on admission yesterday. Asked to see renal failure.  See the trend in Scr below.  She was last seen by our practice on 03/25/20 and signed off as her Scr had been stable at 4-4.5, however it has since started to climb again and was up to 5.29.  She has also had worsening lower extremity edema and cardiology wanted [place] to re-evaluate her and help manage diuresis.    Trend in Creatinine: Creatinine, Ser  Date/Time Value Ref Range Status  03/30/2020 04:38 AM 5.26 (H) 0.44 - 1.00 mg/dL Final  03/29/2020 09:18 AM 5.29 (H) 0.44 - 1.00 mg/dL Final  03/28/2020 12:15 AM 5.15 (H) 0.44 - 1.00 mg/dL Final  03/27/2020 04:28 AM 4.99 (H) 0.44 - 1.00 mg/dL Final  03/26/2020 04:08 AM 4.34 (H) 0.44 - 1.00 mg/dL Final  03/25/2020 04:35 AM 4.16 (H) 0.44 - 1.00 mg/dL Final  03/24/2020 03:49 AM 4.36 (H) 0.44 - 1.00 mg/dL Final  03/23/2020 06:50 AM 4.22 (H) 0.44 - 1.00 mg/dL Final  03/22/2020 05:03 AM 4.29 (H) 0.44 - 1.00 mg/dL Final  03/21/2020 05:00 AM 4.42 (H) 0.44 - 1.00 mg/dL Final  03/20/2020 04:14 AM 4.43 (H) 0.44 - 1.00 mg/dL Final  03/19/2020 03:44 PM 4.14 (H) 0.44 - 1.00 mg/dL Final  03/19/2020 11:18 AM 3.87 (H) 0.44 - 1.00 mg/dL Final  03/19/2020 06:23 AM 3.52 (H) 0.44 - 1.00 mg/dL Final  03/18/2020 05:47 PM 3.31 (H) 0.44 - 1.00 mg/dL Final   Creatinine  Date/Time Value Ref Range Status  03/14/2020 08:58 AM 1.24 (H) 0.44 - 1.00 mg/dL Final  03/08/2020 01:49 PM 1.26 (H) 0.44 - 1.00  mg/dL Final  03/02/2020 12:48 PM 1.17 (H) 0.44 - 1.00 mg/dL Final  02/22/2020 09:25 AM 1.08 (H) 0.44 - 1.00 mg/dL Final  02/09/2020 10:46 AM 1.28 (H) 0.44 - 1.00 mg/dL Final  02/02/2020 12:26 PM 1.70 (H) 0.44 - 1.00 mg/dL Final  01/25/2020 08:26 AM 1.27 (H) 0.44 - 1.00 mg/dL Final  01/12/2020 08:58 AM 1.33 (H) 0.44 - 1.00 mg/dL Final  01/05/2020 12:38 PM 1.32 (H) 0.44 - 1.00 mg/dL Final  12/29/2019 12:02 PM 1.29 (H) 0.44 - 1.00 mg/dL Final  12/15/2019 11:58 AM 1.20 (H) 0.44 - 1.00 mg/dL Final  12/08/2019 12:50 PM 1.31 (H) 0.44 - 1.00 mg/dL Final  12/01/2019 09:35 AM 1.18 (H) 0.44 - 1.00 mg/dL Final  11/17/2019 11:42 AM 1.45 (H) 0.44 - 1.00 mg/dL Final  11/10/2019 09:35 AM 1.35 (H) 0.44 - 1.00 mg/dL Final  11/04/2019 08:06 AM 1.25 (H) 0.44 - 1.00 mg/dL Final  10/28/2019 09:14 AM 1.32 (H) 0.44 - 1.00 mg/dL Final  10/20/2019 09:08 AM 1.35 (H) 0.44 - 1.00 mg/dL Final  10/13/2019 08:53 AM 1.13 (H) 0.44 - 1.00 mg/dL Final  10/06/2019 08:23 AM 1.55 (H) 0.44 - 1.00 mg/dL Final  09/21/2019 10:20 AM 1.21 (H) 0.44 - 1.00 mg/dL Final  09/15/2019 08:10 AM 1.22 (H) 0.44 - 1.00 mg/dL Final  09/08/2019  09:17 AM 1.29 (H) 0.44 - 1.00 mg/dL Final  08/31/2019 08:08 AM 1.26 (H) 0.44 - 1.00 mg/dL Final  08/18/2019 08:10 AM 1.33 (H) 0.44 - 1.00 mg/dL Final  08/11/2019 08:20 AM 1.24 (H) 0.44 - 1.00 mg/dL Final  08/04/2019 09:35 AM 1.17 (H) 0.44 - 1.00 mg/dL Final  07/21/2019 09:20 AM 1.15 (H) 0.44 - 1.00 mg/dL Final  07/14/2019 09:00 AM 1.30 (H) 0.44 - 1.00 mg/dL Final  07/07/2019 09:00 AM 1.36 (H) 0.44 - 1.00 mg/dL Final  06/23/2019 08:25 AM 1.34 (H) 0.44 - 1.00 mg/dL Final  06/16/2019 09:10 AM 1.62 (H) 0.44 - 1.00 mg/dL Final  06/09/2019 10:08 AM 1.14 (H) 0.44 - 1.00 mg/dL Final  05/26/2019 09:27 AM 1.40 (H) 0.44 - 1.00 mg/dL Final  05/19/2019 08:24 AM 1.17 (H) 0.44 - 1.00 mg/dL Final  05/12/2019 08:54 AM 1.18 (H) 0.44 - 1.00 mg/dL Final  05/05/2019 08:04 AM 1.53 (H) 0.44 - 1.00 mg/dL Final  04/14/2019  12:59 PM 1.25 (H) 0.44 - 1.00 mg/dL Final  04/07/2019 08:00 AM 1.26 (H) 0.44 - 1.00 mg/dL Final  03/31/2019 03:23 PM 1.28 (H) 0.44 - 1.00 mg/dL Final  03/24/2019 07:43 AM 1.34 (H) 0.44 - 1.00 mg/dL Final  03/17/2019 07:55 AM 1.37 (H) 0.44 - 1.00 mg/dL Final  03/10/2019 07:52 AM 1.35 (H) 0.44 - 1.00 mg/dL Final  03/03/2019 08:23 AM 1.30 (H) 0.44 - 1.00 mg/dL Final  02/24/2019 07:34 AM 1.21 (H) 0.44 - 1.00 mg/dL Final  02/17/2019 08:15 AM 1.22 (H) 0.44 - 1.00 mg/dL Final  02/10/2019 08:22 AM 1.31 (H) 0.44 - 1.00 mg/dL Final  02/03/2019 09:08 AM 1.33 (H) 0.44 - 1.00 mg/dL Final  01/27/2019 08:37 AM 1.34 (H) 0.44 - 1.00 mg/dL Final  01/19/2019 07:55 AM 1.33 (H) 0.44 - 1.00 mg/dL Final  01/13/2019 08:25 AM 1.33 (H) 0.44 - 1.00 mg/dL Final  01/06/2019 08:21 AM 1.33 (H) 0.44 - 1.00 mg/dL Final    PMH:   Past Medical History:  Diagnosis Date  . Cancer ([place])     PSH:   Past Surgical History:  Procedure Laterality Date  . IR IMAGING GUIDED PORT INSERTION  05/10/2019    Allergies: No Known Allergies  Medications:   Prior to Admission medications   Medication Sig Start Date End Date Taking? Authorizing Provider  acyclovir (ZOVIRAX) 200 MG capsule Take 1 capsule (200 mg total) by mouth 2 (two) times daily. 12/01/19  Yes Heilingoetter, Cassandra L, PA-C  dexamethasone (DECADRON) 4 MG tablet Take 10 tablets (40 mg total) by mouth every 7 (seven) days. 01/05/20  Yes Curt Bears, MD  diphenhydrAMINE (BENADRYL ALLERGY) 25 MG tablet Take 1 tablet (25 mg total) by mouth every 6 (six) hours as needed for itching. 10/23/17  Yes Tanner, Lyndon Code., PA-C  furosemide (LASIX) 40 MG tablet Take 40 mg by mouth daily as needed. 05/24/19  Yes [provider]  loratadine (CLARITIN) 10 MG tablet Take 10 mg by mouth daily.   Yes [provider]  MELATONIN PO Take 10 mg by mouth at bedtime as needed (sleep).   Yes [provider]  metoprolol tartrate (LOPRESSOR) 25 MG tablet TAKE 1/2  TABLET BY MOUTH TWICE DAILY Patient taking differently: 25 mg. 10/28/19  Yes Curt Bears, MD  omeprazole (PRILOSEC) 20 MG capsule Take 1 capsule (20 mg total) by mouth daily. 12/01/19  Yes Heilingoetter, Cassandra L, PA-C  blood glucose meter kit and supplies Dispense based on patient and insurance preference. Use up to four times daily as directed. (  FOR ICD-10 E10.9, E11.9). 09/28/17   Arrien, Jimmy Picket, MD  lidocaine-prilocaine (EMLA) cream Apply 1 application topically as needed. Patient not taking: Reported on 03/19/2020 01/05/20   Curt Bears, MD  oxyCODONE-acetaminophen (PERCOCET) 5-325 MG tablet Take 2 tablets by mouth every 6 (six) hours as needed. Patient not taking: No sig reported 02/17/19   Milton Ferguson, MD  prochlorperazine (COMPAZINE) 10 MG tablet Take 1 tablet (10 mg total) by mouth every 6 (six) hours as needed for nausea or vomiting. Patient not taking: No sig reported 01/05/20   Curt Bears, MD  RELION GLUCOSE TEST STRIPS test strip USE 1 TO CHECK GLUCOSE ONCE DAILY 03/06/18   [provider]  ReliOn Ultra Thin Lancets 30G MISC USE 1 TO CHECK GLUCOSE ONCE DAILY FOR GLUCOSE TESTING 03/06/18   [provider]    Inpatient medications: . acyclovir  200 mg Oral BID  . amLODipine  2.5 mg Oral Daily  . carvedilol  12.5 mg Oral BID WC  . Chlorhexidine Gluconate Cloth  6 each Topical Daily  . furosemide  40 mg Intravenous Once  . hydrALAZINE  100 mg Oral Q8H  . isosorbide mononitrate  30 mg Oral BID  . loratadine  10 mg Oral Daily  . sodium chloride flush  10-40 mL Intracatheter Q12H    Discontinued Meds:   Medications Discontinued During This Encounter  Medication Reason  . traMADol (ULTRAM) 50 MG tablet Error  . 0.9 %  sodium chloride infusion   . metoprolol tartrate (LOPRESSOR) tablet 25 mg   . labetalol (NORMODYNE) injection 10 mg   . furosemide (LASIX) injection 80 mg   . amLODipine (NORVASC) tablet 10 mg   . hydrALAZINE (APRESOLINE)  tablet 25 mg   . isosorbide mononitrate (IMDUR) 24 hr tablet 30 mg   . piperacillin-tazobactam (ZOSYN) IVPB 3.375 g   . sodium bicarbonate 150 mEq in sterile water 1,000 mL infusion   . 0.9 %  sodium chloride infusion   . hydrALAZINE (APRESOLINE) tablet 37.5 mg   . amoxicillin-clavulanate (AUGMENTIN) 500-125 MG per tablet 500 mg   . hydrALAZINE (APRESOLINE) tablet 50 mg   . 0.9 %  sodium chloride infusion   . carvedilol (COREG) tablet 3.125 mg   . hydrALAZINE (APRESOLINE) tablet 75 mg   . carvedilol (COREG) tablet 6.25 mg     Social History:  reports that she has never smoked. She has never used smokeless tobacco. She reports that she does not drink alcohol and does not use drugs.  Family History:   Family History  Problem Relation Age of Onset  . Hypertension Other     Pt denies any N/V/D, dysuria, pyuria, hematuria, shortness of breath but does have lower extremity edema and fatigue Weight change: 0.4 kg  Intake/Output Summary (Last 24 hours) at 03/30/2020 1528 Last data filed at 03/30/2020 1500 Gross per 24 hour  Intake 720 ml  Output 900 ml  Net -180 ml   BP 124/74 (BP Location: Right Arm)   Pulse 86   Temp 98 F (36.7 C) (Oral)   Resp 20   Ht '5\' 7"'  (1.702 m)   Wt 90.4 kg   SpO2 95%   BMI 31.21 kg/m  Vitals:   03/29/20 1334 03/29/20 2023 03/30/20 0508 03/30/20 1349  BP: 116/64 (!) 139/92 139/76 124/74  Pulse: 87 84 87 86  Resp:  '20 18 20  ' Temp: 97.7 F (36.5 C) 98.2 F (36.8 C) 98.4 F (36.9 C) 98 F (36.7 C)  TempSrc: Oral  Oral Oral Oral  SpO2: 97% 100% 97% 95%  Weight:   90.4 kg   Height:         General appearance: cooperative, no distress and slowed mentation Head: Normocephalic, without obvious abnormality, atraumatic Resp: clear to auscultation bilaterally Cardio: regular rate and rhythm, S1, S2 normal, no murmur, click, rub or gallop GI: soft, non-tender; bowel sounds normal; no masses,  no organomegaly Extremities: edema 2+ bilateral lower  extremity edema and presacral edema  Labs: Basic Metabolic Panel: Recent Labs  Lab 03/24/20 0349 03/25/20 0435 03/26/20 0408 03/27/20 0428 03/28/20 0015 03/29/20 0918 03/30/20 0438  NA 136 135 134* 134* 134* 134* 133*  K 3.6 3.6 3.8 3.9 4.1 3.7 3.9  CL 98 98 98 99 99 98 99  CO2 '26 25 24 25 24 23 ' 21*  GLUCOSE 80 89 84 86 96 143* 96  BUN 69* 69* 67* 78* 75* 79* 77*  CREATININE 4.36* 4.16* 4.34* 4.99* 5.15* 5.29* 5.26*  CALCIUM 8.0* 7.8* 8.1* 8.0* 7.9* 8.4* 8.0*   Liver Function Tests: No results for input(s): AST, ALT, ALKPHOS, BILITOT, PROT, ALBUMIN in the last 168 hours. No results for input(s): LIPASE, AMYLASE in the last 168 hours. No results for input(s): AMMONIA in the last 168 hours. CBC: Recent Labs  Lab 03/26/20 0408 03/27/20 0428 03/28/20 0015 03/29/20 0918  WBC 2.9* 2.5* 2.8* 4.1  HGB 9.6* 8.5* 8.1* 8.8*  HCT 28.8* 25.3* 24.5* 26.4*  MCV 96.0 95.5 96.8 96.4  PLT 37* 38* 42* 59*   PT/INR: '@LABRCNTIP' (inr:5) Cardiac Enzymes: )No results for input(s): CKTOTAL, CKMB, CKMBINDEX, TROPONINI in the last 168 hours. CBG: No results for input(s): GLUCAP in the last 168 hours.  Iron Studies: No results for input(s): IRON, TIBC, TRANSFERRIN, FERRITIN in the last 168 hours.  Xrays/Other Studies: DG CHEST PORT 1 VIEW  Result Date: 03/29/2020 CLINICAL DATA:  Shortness of breath. EXAM: PORTABLE CHEST 1 VIEW COMPARISON:  March 21, 2020. FINDINGS: Stable cardiomegaly. Right internal jugular Port-A-Cath is unchanged in position. No pneumothorax is noted. Increased bibasilar opacities are noted concerning for worsening atelectasis or infiltrates with associated effusions. Bony thorax is unremarkable. IMPRESSION: Increased bibasilar opacities are noted concerning for worsening atelectasis or infiltrates with associated effusions. Electronically Signed   By: Marijo Conception M.D.   On: 03/29/2020 15:20     Assessment/Plan: 1.  AKI/CKD stage 3b- initial insult occurred from  03/14/20 when Scr was 1.24 to her admission on 03/18/20 with Scr of 3.31.  DDx was volume depletion from colitis vs ATN, covid infection, myeloma kidney, TMA due to chemo, or possibly cardiorenal with new onset cardiomyopathy (EF 25-30% presumably due to cardiotoxicity of chemo).  She has had good UOP but remains volume overloaded.  Had low UNa initially with blood and protein on UA.  Renal US without obstruction.  Discussed her progressive disease and the possible need for dialysis if her renal function continues to worsen.  Will try IV albumin followed by IV lasix 40 mg x 1 and follow UOP and Scr.  She is a poor candidate for long-term dialysis given her progressive myeloma and cardiomyopathy as well as her episodes of arhytmia and hypotension. 2. Progressive IgA multiple myeloma despite ongoing chemo.  Recommend discussion with Dr. Julien Nordmann regarding prognosis and options of therapies. 3. COVID infection - felt to be incidental as she is without respiratory symptoms.  Possibly can come out of respiratory isolation per primary 4. Thrombocytopenia- transfused 5. Acute combined systolic and diastolic CHF- mainly right  sided failure at this time.  Will try IV albumin/lasix as above.  Felt to be due to cardiotoxicity of carflilzomib.  6. Hyponatremia- related to volume overload. 7. Disposition - poor overall prognosis.  Palliative care was consulted to help set goals/limits of care, however it appears they were not open to discussions if her condition were to worsen.   Governor Rooks Jacques Willingham 03/30/2020, 3:28 PM

## 2020-03-30 NOTE — Plan of Care (Signed)

## 2020-03-30 NOTE — Progress Notes (Signed)
PT Cancellation Note  Patient Details Name: Debbie Chan MRN: [id number] DOB: 1950/10/19   Cancelled Treatment:     PT order received but eval deferred.  PT evaluation performed 03/20/20 with pt IND in room.  RN advises no deterioration in pt status and pt continues to mobilize in room unassisted.  Pt scheduled for dc this date. Marland Kitchen    Leonila Speranza 03/30/2020, 10:46 AM

## 2020-03-30 NOTE — Progress Notes (Signed)
OT Cancellation Note  Patient Details Name: Flavia Bruss MRN: [id number] DOB: January 14, 1951   Cancelled Treatment:    Reason Eval/Treat Not Completed: OT screened, no needs identified, will sign off. RN reports patient is walking to the bathroom independently, no change in status and is to go home today. Please refer to eval completed on 03/21/20.   Delbert Phenix OT OT pager: [place] 03/30/2020, 9:50 AM

## 2020-03-30 NOTE — Progress Notes (Signed)
PROGRESS NOTE  Debbie Chan PYK:[id number] DOB: 1950/11/18 DOA: 03/18/2020 PCP: [name], MD  HPI/Recap of past 24 hours: Debbie Chan is a 70 year old female with past medical history significant for multiple myeloma, atrial flutter, chronic anemia who presented to the hospital with complaints of weakness.  She was found to have severe thrombocytopenia and AKI.  She was given IV fluid, platelet transfusions.  She was also incidentally found to have COVID-19. Hospitalization complicated by renal failure as well as heart failure. Nephrology and cardiology have been following.   03/30/20: Seen and examined at her bedside.  Continues to have significant volume overload.  Nephrology consulted to assist with possible evaluation in the setting of AKI on CKD 3B.  Trial of IV albumin followed by IV Lasix 40 mg x 1 per nephrology.  Closely monitor urine output and creatinine.  Assessment/Plan: Active Problems:   Anemia   Acute renal failure superimposed on stage 3b chronic kidney disease (HCC)   Atrial flutter (HCC)   Multiple myeloma without remission (HCC)   Thrombocytopenia (HCC)   Colitis   Hematuria   Polycystic kidney   Hyponatremia   Dehydration  Nonoliguric AKI on CKD stage IIIb likely multifactorial secondary to dehydration, myeloma kidney, heart failure. -Baseline creatinine 1.2 -Renal ultrasound without sign of obstruction -Could be secondary to dehydration, myeloma kidney, heart failure -Nephrology signed off 1/30, planning to follow up with patient in office in 2 weeks  Creatinine 5.26 from 5.29 from 5.15 from 4.99. Continue to avoid nephrotoxins, dehydration and hypotension. Good urine output 1300 cc recorded last 24 hours. Repeat renal panel in the morning.  Acute systolic heart failure -BNP 1809.1 --> 594.1 -Suspect cardiomyopathy could be secondary to cardiotoxic effects from carfilzomib  -Cardiology following -Continue coreg   Trial of IV albumin followed by IV  Lasix 40 mg x 1 per nephrology.  Closely monitor urine output and creatinine.  Bilateral lower extremity edema Continue to elevate extremities.  COVID-19 -Incidental finding, tested positive 1/24.  On room air.  CRP 0.9. Can be out of isolation after 10 days, starting 2/3 O2 saturation 100% on room air.  Thrombocytopenia -Presented with platelet 8000 on admission.  Transfused 1 unit platelets -Hematology/oncology following.  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  Transfuse for platelets <20,000 for acute bleeding -Platelet count 59K from 42K, uptrending.  Treated acute colitis -GI PCR negative -CT abdomen pelvis reveals colitis of the distal descending and sigmoid colon -Completed antibiotic therapy  Multiple myeloma -Follows with [name]-Completed course of Decadron  Paroxysmal atrial flutter -CHA2DS2-VASc 4 -Anticoagulation currently on hold due to thrombocytopenia  Hypertension -Continue hydralazine, Imdur  Polycystic morphology kidneys -Follow-up outpatient    DVT prophylaxis:  SCDs Start: 03/19/20 1051  Code Status: Full code Family Communication:  Updated her son via telephone 03/30/2020.  Consults: Cardiology Nephrology.   Status is: Inpatient    Dispo: The patient is from: Home.              Anticipated d/c is to: Home.              Anticipated d/c date is: 03/31/2020.              Patient currently not stable for discharge.   Difficult to place patient N/A        Objective: Vitals:   03/29/20 1334 03/29/20 2023 03/30/20 0508 03/30/20 1349  BP: 116/64 (!) 139/92 139/76 124/74  Pulse: 87 84 87 86  Resp:  '20 18 20  ' Temp: 97.7 F (36.5 C) 98.2 F (36.8 C) 98.4 F (36.9 C) 98 F (36.7 C)  TempSrc: Oral Oral Oral Oral  SpO2: 97% 100% 97% 95%  Weight:   90.4 kg   Height:        Intake/Output Summary (Last 24 hours) at 03/30/2020 1746 Last data filed at 03/30/2020 1719 Gross per 24  hour  Intake 520 ml  Output 1200 ml  Net -680 ml   Filed Weights   03/28/20 0600 03/29/20 0500 03/30/20 0508  Weight: 94.5 kg 90 kg 90.4 kg    Exam:  . General: 70 y.o. year-old female pleasant well-developed well-nourished no distress.  Alert and oriented x3. . Cardiovascular: Regular rate and rhythm no rubs or gallops.   Marland Kitchen Respiratory: Mild rales at bases no wheezing noted.   . Abdomen: Soft nontender normal bowel sounds present. . Musculoskeletal: 3+ edema in lower extremities bilaterally.   . Skin: No ulcerative lesions noted. Marland Kitchen Psychiatry: Mood is appropriate for condition and setting.  Data Reviewed: CBC: Recent Labs  Lab 03/25/20 0435 03/26/20 0408 03/27/20 0428 03/28/20 0015 03/29/20 0918  WBC 2.9* 2.9* 2.5* 2.8* 4.1  HGB 10.4* 9.6* 8.5* 8.1* 8.8*  HCT 31.0* 28.8* 25.3* 24.5* 26.4*  MCV 96.9 96.0 95.5 96.8 96.4  PLT 39* 37* 38* 42* 59*   Basic Metabolic Panel: Recent Labs  Lab 03/26/20 0408 03/27/20 0428 03/28/20 0015 03/29/20 0918 03/30/20 0438  NA 134* 134* 134* 134* 133*  K 3.8 3.9 4.1 3.7 3.9  CL 98 99 99 98 99  CO2 '24 25 24 23 ' 21*  GLUCOSE 84 86 96 143* 96  BUN 67* 78* 75* 79* 77*  CREATININE 4.34* 4.99* 5.15* 5.29* 5.26*  CALCIUM 8.1* 8.0* 7.9* 8.4* 8.0*   GFR: Estimated Creatinine Clearance: 11.6 mL/min (A) (by C-G formula based on SCr of 5.26 mg/dL (H)). Liver Function Tests: No results for input(s): AST, ALT, ALKPHOS, BILITOT, PROT, ALBUMIN in the last 168 hours. No results for input(s): LIPASE, AMYLASE in the last 168 hours. No results for input(s): AMMONIA in the last 168 hours. Coagulation Profile: No results for input(s): INR, PROTIME in the last 168 hours. Cardiac Enzymes: No results for input(s): CKTOTAL, CKMB, CKMBINDEX, TROPONINI in the last 168 hours. BNP (last 3 results) No results for input(s): PROBNP in the last 8760 hours. HbA1C: No results for input(s): HGBA1C in the last 72 hours. CBG: No results for input(s): GLUCAP  in the last 168 hours. Lipid Profile: No results for input(s): CHOL, HDL, LDLCALC, TRIG, CHOLHDL, LDLDIRECT in the last 72 hours. Thyroid Function Tests: No results for input(s): TSH, T4TOTAL, FREET4, T3FREE, THYROIDAB in the last 72 hours. Anemia Panel: No results for input(s): VITAMINB12, FOLATE, FERRITIN, TIBC, IRON, RETICCTPCT in the last 72 hours. Urine analysis:    Component Value Date/Time   COLORURINE YELLOW 03/18/2020 1747   APPEARANCEUR HAZY (A) 03/18/2020 1747   LABSPEC 1.016 03/18/2020 1747   PHURINE 5.0 03/18/2020 1747   GLUCOSEU NEGATIVE 03/18/2020 1747   HGBUR LARGE (A) 03/18/2020 1747   BILIRUBINUR NEGATIVE 03/18/2020 1747   KETONESUR NEGATIVE 03/18/2020 1747   PROTEINUR >=300 (A) 03/18/2020 1747   NITRITE NEGATIVE 03/18/2020 1747   LEUKOCYTESUR NEGATIVE 03/18/2020 1747   Sepsis Labs: '@LABRCNTIP' (procalcitonin:4,lacticidven:4)  ) No results found for this or any previous visit (from the past 240 hour(s)).    Studies: No results found.  Scheduled Meds: . acyclovir  200 mg Oral BID  . amLODipine  2.5 mg Oral Daily  . carvedilol  12.5 mg Oral BID WC  . Chlorhexidine Gluconate Cloth  6 each Topical Daily  . hydrALAZINE  100 mg Oral Q8H  . isosorbide mononitrate  30 mg Oral BID  . loratadine  10 mg Oral Daily  . sodium chloride flush  10-40 mL Intracatheter Q12H    Continuous Infusions:   LOS: 12 days     Kayleen Memos, MD [facility] Pager [phone]  If 7PM-7AM, please contact night-coverage [email or web address] Password [facility] 03/30/2020, 5:46 PM

## 2020-03-30 NOTE — Progress Notes (Signed)
Progress Note  Patient Name: Debbie Chan Date of Encounter: 03/30/2020  [facility] Cardiologist: Glori Bickers, MD   Subjective   Feeling Okay.  Shortness of breath.  Inpatient Medications    Scheduled Meds: . acyclovir  200 mg Oral BID  . amLODipine  2.5 mg Oral Daily  . carvedilol  12.5 mg Oral BID WC  . Chlorhexidine Gluconate Cloth  6 each Topical Daily  . hydrALAZINE  100 mg Oral Q8H  . isosorbide mononitrate  30 mg Oral BID  . loratadine  10 mg Oral Daily  . sodium chloride flush  10-40 mL Intracatheter Q12H   Continuous Infusions:  PRN Meds: acetaminophen **OR** acetaminophen, hydrALAZINE, HYDROcodone-acetaminophen, melatonin, sodium chloride, sodium chloride flush   Vital Signs    Vitals:   03/29/20 0500 03/29/20 1334 03/29/20 2023 03/30/20 0508  BP:  116/64 (!) 139/92 139/76  Pulse:  87 84 87  Resp:   20 18  Temp:  97.7 F (36.5 C) 98.2 F (36.8 C) 98.4 F (36.9 C)  TempSrc:  Oral Oral Oral  SpO2:  97% 100% 97%  Weight: 90 kg   90.4 kg  Height:        Intake/Output Summary (Last 24 hours) at 03/30/2020 1133 Last data filed at 03/30/2020 1000 Gross per 24 hour  Intake 600 ml  Output 900 ml  Net -300 ml   Last 3 Weights 03/30/2020 03/29/2020 03/28/2020  Weight (lbs) 199 lb 4.7 oz 198 lb 6.6 oz 208 lb 5.4 oz  Weight (kg) 90.4 kg 90 kg 94.5 kg      Telemetry    Sinus rhythm.  PVCs - Personally Reviewed  ECG    n/a - Personally Reviewed  Physical Exam   VS:  BP 139/76 (BP Location: Right Arm)   Pulse 87   Temp 98.4 F (36.9 C) (Oral)   Resp 18   Ht _0  (1.702 m)   Wt 90.4 kg   SpO2 97%   BMI 31.21 kg/m  , BMI Body mass index is 31.21 kg/m. GENERAL:  Well appearing HEENT: Pupils equal round and reactive, fundi not visualized, oral mucosa unremarkable NECK:  JVP 2 cm above clavicle sitting upright.  Waveform within normal limits, carotid upstroke brisk and symmetric, no bruits LUNGS:  Poor air movement.  Mild crackles at  bases HEART:  RRR.  PMI not displaced or sustained,S1 and S2 within normal limits, no S3, no S4, no clicks, no rubs, II/VI systolic murmur ABD:  Flat, positive bowel sounds normal in frequency in pitch, no bruits, no rebound, no guarding, no midline pulsatile mass, no hepatomegaly, no splenomegaly EXT:  2 plus pulses throughout, 2+ LE edema to lower tibia bilaterally, no cyanosis no clubbing SKIN:  No rashes no nodules NEURO:  Cranial nerves II through XII grossly intact, motor grossly intact throughout PSYCH:  Cognitively intact, oriented to person place and time   Labs    High Sensitivity Troponin:  No results for input(s): TROPONINIHS in the last 720 hours.    Chemistry Recent Labs  Lab 03/28/20 0015 03/29/20 0918 03/30/20 0438  NA 134* 134* 133*  K 4.1 3.7 3.9  CL 99 98 99  CO2 24 23 21*  GLUCOSE 96 143* 96  BUN 75* 79* 77*  CREATININE 5.15* 5.29* 5.26*  CALCIUM 7.9* 8.4* 8.0*  GFRNONAA 9* 8* 8*  ANIONGAP _1 Hematology Recent Labs  Lab 03/27/20 0428 03/28/20 0015 03/29/20 0918  WBC 2.5* 2.8* 4.1  RBC 2.65* 2.53* 2.74*  HGB 8.5* 8.1* 8.8*  HCT 25.3* 24.5* 26.4*  MCV 95.5 96.8 96.4  MCH 32.1 32.0 32.1  MCHC 33.6 33.1 33.3  RDW 17.1* 17.0* 17.2*  PLT 38* 42* 59*    BNP Recent Labs  Lab 03/29/20 1316  BNP 622.2*     DDimer No results for input(s): DDIMER in the last 168 hours.   Radiology    DG CHEST PORT 1 VIEW  Result Date: 03/29/2020 CLINICAL DATA:  Shortness of breath. EXAM: PORTABLE CHEST 1 VIEW COMPARISON:  March 21, 2020. FINDINGS: Stable cardiomegaly. Right internal jugular Port-A-Cath is unchanged in position. No pneumothorax is noted. Increased bibasilar opacities are noted concerning for worsening atelectasis or infiltrates with associated effusions. Bony thorax is unremarkable. IMPRESSION: Increased bibasilar opacities are noted concerning for worsening atelectasis or infiltrates with associated effusions. Electronically Signed   By:  Marijo Conception M.D.   On: 03/29/2020 15:20    Cardiac Studies   03/19/20 echo  1. Left ventricular ejection fraction, by estimation, is 25 to 30%. The  left ventricle has severely decreased function. The left ventricle  demonstrates global hypokinesis although the septal LV segments appear  slightly worse. Left ventricular diastolic  parameters are consistent with Grade II diastolic dysfunction  (pseudonormalization).  2. Right ventricular systolic function is mildly reduced. The right  ventricular size is moderately enlarged. There is moderately elevated  pulmonary artery systolic pressure.  3. The mitral valve is grossly normal. Mild mitral valve regurgitation.  4. Right atrial size was massively dilated.  5. Tricuspid valve regurgitation is moderate to severe.  6. The aortic valve is tricuspid. There is mild calcification of the  aortic valve. There is mild thickening of the aortic valve. Aortic valve  regurgitation is mild.  7. Aortic dilatation noted. 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  8. The inferior vena cava is normal in size with greater than 50%  respiratory variability, suggesting right atrial pressure of 3 mmHg.   Patient Profile     70 y.o. female with multiple myeloma not in remission, status post 11 cycles of carfilzomib and cyclophosphamide, history of paroxysmal atrial flutter 2019, history of severe tricuspid regurgitation and right heart enlargement, admitted for suspected infectious colitis, acute renal insufficiency and severe thrombocytopenia and found to have severely depressed left ventricular systolic function, new compared to 2019.  Assessment & Plan    # Acute systolic and diastolic CHF:  # HTN: f Found to have EF 25-30% on echo this admission.  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 Symptoms of CHF preceded COVID-19 infection and she has no other symptoms to suggest  myocarditis. No anginal complaints to suggest CAD.  Consider outpatient Lexiscan Myoview given her renal dysfunction. She also had evidence of chronic right heart failure with dilated RA on prior echo in 2019 and evidence of severe TR on echo this admission.  Carvedilol increased  in the setting of increased AVNRT episodes.  She does appear to be more volume overloaded, though with worsening renal function will wait on diuresis.  BNP was 622, improved from 1800 on admission.  Her lower extremity edema is increasing and she is persistently hyponatremic.  Suspect hypervolemic hyponatremia.  Renal function is also continuing to decline.  Appreciate nephrology seeing her again today and guiding her diuresis if they feel it is appropriate.  It is difficult to determine how much of her renal dysfunction is  cardiorenal versus progression of her myeloma.  Continue carvedilol, hydralazine and Imdur.  Amlodipine was used due to lack of options in the setting of renal dysfunction.  #. Paroxysmal atrial flutter: Maintaining sinus rhythm. Anticoagulation on hold given worsening renal function and thrombocytopenia. I ntermediate to high embolic risk with a [id number] score of 4 (CHF, HTN, female, and age 17-74).  Continue carvedilol for rate control.   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 Carvedilol increased this admission with no recurrence.  # IgA multiple myeloma:  # AoCKD stage 3:  Concerned current chemotherapy is causing cardiotoxicity.  There is concern that this is also causing her renal dysfunction.  Nephrology signed off but renal function continues to decline.  She is also getting increasingly volume overloaded.  I'm worried that if she is discharged she may develop respiratory symptoms.  Appreciate nephrology weighing in on whether we can safely diurese.   # Thrombocytopenia: PLT count 8 on admission, improved post-transfusion  and currently up to 59.     For questions or updates, please contact [place] Please consult [email or web address] for contact info under        Signed, Bronc Brosseau C. Oval Linsey, MD, [facility] 03/30/2020 11:33 AM

## 2020-03-30 NOTE — Plan of Care (Signed)
  Problem: Education: Goal: Knowledge of risk factors and measures for prevention of condition will improve Outcome: Progressing   Problem: Coping: Goal: Psychosocial and spiritual needs will be supported Outcome: Progressing   Problem: Respiratory: Goal: Will maintain a patent airway Outcome: Progressing Goal: Complications related to the disease process, condition or treatment will be avoided or minimized Outcome: Progressing   

## 2020-03-31 DIAGNOSIS — N170 Acute kidney failure with tubular necrosis: Principal | ICD-10-CM

## 2020-03-31 LAB — CBC
HCT: 22.9 % — ABNORMAL LOW (ref 36.0–46.0)
Hemoglobin: 7.6 g/dL — ABNORMAL LOW (ref 12.0–15.0)
MCH: 32.2 pg (ref 26.0–34.0)
MCHC: 33.2 g/dL (ref 30.0–36.0)
MCV: 97 fL (ref 80.0–100.0)
Platelets: 65 10*3/uL — ABNORMAL LOW (ref 150–400)
RBC: 2.36 MIL/uL — ABNORMAL LOW (ref 3.87–5.11)
RDW: 17.2 % — ABNORMAL HIGH (ref 11.5–15.5)
WBC: 2.9 10*3/uL — ABNORMAL LOW (ref 4.0–10.5)
nRBC: 0 % (ref 0.0–0.2)

## 2020-03-31 LAB — RENAL FUNCTION PANEL
Albumin: 3.1 g/dL — ABNORMAL LOW (ref 3.5–5.0)
Anion gap: 10 (ref 5–15)
BUN: 76 mg/dL — ABNORMAL HIGH (ref 8–23)
CO2: 22 mmol/L (ref 22–32)
Calcium: 7.9 mg/dL — ABNORMAL LOW (ref 8.9–10.3)
Chloride: 98 mmol/L (ref 98–111)
Creatinine, Ser: 5.15 mg/dL — ABNORMAL HIGH (ref 0.44–1.00)
GFR, Estimated: 9 mL/min — ABNORMAL LOW (ref >60)
Glucose, Bld: 89 mg/dL (ref 70–99)
Phosphorus: 5.2 mg/dL — ABNORMAL HIGH (ref 2.5–4.6)
Potassium: 3.8 mmol/L (ref 3.5–5.1)
Sodium: 130 mmol/L — ABNORMAL LOW (ref 135–145)

## 2020-03-31 MED ORDER — FUROSEMIDE 10 MG/ML IJ SOLN
40.0000 mg | Freq: Once | INTRAMUSCULAR | Status: AC
  Administered 2020-03-31: 40 mg via INTRAVENOUS
  Filled 2020-03-31: qty 4

## 2020-03-31 MED ORDER — ALBUMIN HUMAN 25 % IV SOLN
12.5000 g | Freq: Once | INTRAVENOUS | Status: AC
  Administered 2020-03-31: 12.5 g via INTRAVENOUS
  Filled 2020-03-31: qty 50

## 2020-03-31 NOTE — Progress Notes (Signed)
PROGRESS NOTE  Debbie Chan PIR:[id number] DOB: 27-Jul-1950 DOA: 03/18/2020 PCP: [name], MD  HPI/Recap of past 24 hours: Debbie Chan is a 70 year old female with past medical history significant for multiple myeloma, atrial flutter, chronic anemia who presented to the hospital with complaints of weakness.  She was found to have severe thrombocytopenia and AKI.  She was given IV fluid, platelet transfusions.  She was also incidentally found to have COVID-19. Hospitalization complicated by renal failure as well as heart failure. Nephrology and cardiology have been following.   Volume overload.  Nephrology consulted with diuresing in the setting of AKI on CKD 3B.  Trial of IV albumin followed by IV Lasix 40 mg x 1 per nephrology.   03/31/20: Seen and examined at her bedside.  She has no new complaints.  Bilateral lower extremity edema is slightly improving.  Good urine output after IV Lasix given yesterday.  Creatinine is downtrending.     03/31/20: Seen and examined at her bedside.  Continues to have significant  Assessment/Plan: Active Problems:   Anemia   Acute renal failure superimposed on stage 3b chronic kidney disease (HCC)   Atrial flutter (HCC)   Multiple myeloma without remission (HCC)   Thrombocytopenia (HCC)   Colitis   Hematuria   Polycystic kidney   Hyponatremia   Dehydration  Nonoliguric AKI on CKD stage IIIb likely multifactorial secondary to dehydration, myeloma kidney, heart failure. -Baseline creatinine 1.2 -Renal ultrasound without sign of obstruction -Could be secondary to dehydration, myeloma kidney, heart failure -Nephrology signed off 1/30, planning to follow up with patient in office in 2 weeks. Nephrology was reconsulted on 03/30/2020 to assist with diuresing in the setting of AKI on CKD 3B. Creatinine is downtrending 5.15 from 5.26 from 5.29 from 5.15 from 4.99. Continue to avoid nephrotoxins, dehydration and hypotension. Good urine output, 1800 cc  recorded last 24 hours. Repeat renal panel in the morning. Appreciate nephrology's assistance.  Acute systolic heart failure -BNP 1809.1 --> 594.1 -Suspect cardiomyopathy could be secondary to cardiotoxic effects from carfilzomib  -Continue Norvasc 2.5 mg daily, Coreg 12.5 mg twice daily, p.o. hydralazine 100 mg 3 times daily, Imdur 30 mg twice daily. Received a trial of IV albumin followed by IV Lasix 40 mg x 1 per nephrology on 03/30/2020 with positive results. Continue to closely monitor urine output and renal function. Appreciate cardiology's assistance.  Bilateral lower extremity edema Continue to elevate extremities Continue diuresing as tolerated.  COVID-19 -Incidental finding, tested positive 1/24.  On room air.  CRP 0.9. Can be out of isolation after 10 days, starting 2/3 O2 saturation 100% on room air.  Thrombocytopenia, improving -Presented with platelet 8000 on admission.  Transfused 1 unit platelets -Hematology/oncology following.  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  Transfuse for platelets <20,000 for acute bleeding -Platelet count 65K from 59K from 42K, uptrending.  Treated acute colitis -GI PCR negative -CT abdomen pelvis reveals colitis of the distal descending and sigmoid colon -Completed antibiotic therapy  Multiple myeloma -Follows with [name]-Completed course of Decadron  Paroxysmal atrial flutter -CHA2DS2-VASc 4 -Anticoagulation currently on hold due to thrombocytopenia  Hypertension BP is currently at goal. Management per above.  Polycystic morphology kidneys -Follow-up outpatient    DVT prophylaxis:  SCDs Start: 03/19/20 1051  Code Status: Full code Family Communication:  Updated her son via telephone 03/30/2020.  Consults: Cardiology Nephrology.   Status is: Inpatient    Dispo: The patient is from: Home.  Anticipated d/c is to: Home.               Anticipated d/c date is: 04/02/2020.              Patient currently not stable for discharge, ongoing diuresing.   Difficult to place patient N/A        Objective: Vitals:   03/30/20 2036 03/31/20 0500 03/31/20 0522 03/31/20 1337  BP: (!) 150/91  (!) 165/89 121/76  Pulse: 85  87 81  Resp: '20  18 18  ' Temp: 98.3 F (36.8 C)  99.2 F (37.3 C) 98 F (36.7 C)  TempSrc: Oral  Oral Oral  SpO2: 96%  97% 96%  Weight:  88.4 kg    Height:        Intake/Output Summary (Last 24 hours) at 03/31/2020 1638 Last data filed at 03/31/2020 1435 Gross per 24 hour  Intake 810 ml  Output 1300 ml  Net -490 ml   Filed Weights   03/29/20 0500 03/30/20 0508 03/31/20 0500  Weight: 90 kg 90.4 kg 88.4 kg    Exam:  . General: 70 y.o. year-old female pleasant well-developed well-nourished in no acute distress.  Alert and oriented x3.   . Cardiovascular: Regular rate and rhythm no rubs or gallops. Marland Kitchen Respiratory: Clear to auscultation no wheeze no rales. . Abdomen: Soft nontender normal bowel sounds present. . Musculoskeletal: 2+ pitting edema in lower extremities bilaterally. . Skin: No ulcerative lesions noted.   Marland Kitchen Psychiatry: Mood is appropriate for condition and setting..  Data Reviewed: CBC: Recent Labs  Lab 03/26/20 0408 03/27/20 0428 03/28/20 0015 03/29/20 0918 03/31/20 0430  WBC 2.9* 2.5* 2.8* 4.1 2.9*  HGB 9.6* 8.5* 8.1* 8.8* 7.6*  HCT 28.8* 25.3* 24.5* 26.4* 22.9*  MCV 96.0 95.5 96.8 96.4 97.0  PLT 37* 38* 42* 59* 65*   Basic Metabolic Panel: Recent Labs  Lab 03/27/20 0428 03/28/20 0015 03/29/20 0918 03/30/20 0438 03/31/20 0430  NA 134* 134* 134* 133* 130*  K 3.9 4.1 3.7 3.9 3.8  CL 99 99 98 99 98  CO2 '25 24 23 ' 21* 22  GLUCOSE 86 96 143* 96 89  BUN 78* 75* 79* 77* 76*  CREATININE 4.99* 5.15* 5.29* 5.26* 5.15*  CALCIUM 8.0* 7.9* 8.4* 8.0* 7.9*  PHOS  --   --   --   --  5.2*   GFR: Estimated Creatinine Clearance: 11.8 mL/min (A) (by C-G formula based on SCr of 5.15  mg/dL (H)). Liver Function Tests: Recent Labs  Lab 03/31/20 0430  ALBUMIN 3.1*   No results for input(s): LIPASE, AMYLASE in the last 168 hours. No results for input(s): AMMONIA in the last 168 hours. Coagulation Profile: No results for input(s): INR, PROTIME in the last 168 hours. Cardiac Enzymes: No results for input(s): CKTOTAL, CKMB, CKMBINDEX, TROPONINI in the last 168 hours. BNP (last 3 results) No results for input(s): PROBNP in the last 8760 hours. HbA1C: No results for input(s): HGBA1C in the last 72 hours. CBG: No results for input(s): GLUCAP in the last 168 hours. Lipid Profile: No results for input(s): CHOL, HDL, LDLCALC, TRIG, CHOLHDL, LDLDIRECT in the last 72 hours. Thyroid Function Tests: No results for input(s): TSH, T4TOTAL, FREET4, T3FREE, THYROIDAB in the last 72 hours. Anemia Panel: No results for input(s): VITAMINB12, FOLATE, FERRITIN, TIBC, IRON, RETICCTPCT in the last 72 hours. Urine analysis:    Component Value Date/Time   COLORURINE YELLOW 03/30/2020 1626   APPEARANCEUR CLEAR 03/30/2020 1626   LABSPEC 1.009 03/30/2020  1626   PHURINE 6.0 03/30/2020 1626   GLUCOSEU NEGATIVE 03/30/2020 1626   HGBUR SMALL (A) 03/30/2020 1626   BILIRUBINUR NEGATIVE 03/30/2020 1626   KETONESUR NEGATIVE 03/30/2020 1626   PROTEINUR 100 (A) 03/30/2020 1626   NITRITE NEGATIVE 03/30/2020 1626   LEUKOCYTESUR NEGATIVE 03/30/2020 1626   Sepsis Labs: '@LABRCNTIP' (procalcitonin:4,lacticidven:4)  ) No results found for this or any previous visit (from the past 240 hour(s)).    Studies: No results found.  Scheduled Meds: . acyclovir  200 mg Oral BID  . amLODipine  2.5 mg Oral Daily  . carvedilol  12.5 mg Oral BID WC  . Chlorhexidine Gluconate Cloth  6 each Topical Daily  . hydrALAZINE  100 mg Oral Q8H  . isosorbide mononitrate  30 mg Oral BID  . loratadine  10 mg Oral Daily  . sodium chloride flush  10-40 mL Intracatheter Q12H    Continuous Infusions:   LOS: 13 days      Kayleen Memos, MD [facility] Pager [phone]  If 7PM-7AM, please contact night-coverage [email or web address] Password [facility] 03/31/2020, 4:38 PM

## 2020-03-31 NOTE — Progress Notes (Signed)
Patient ID: Debbie Chan, female   DOB: 09/13/1950, 69 y.o.   MRN: [id number] S: Feels better today O:BP (!) 165/89 (BP Location: Right Arm)   Pulse 87   Temp 99.2 F (37.3 C) (Oral)   Resp 18   Ht 5' 7" (1.702 m)   Wt 88.4 kg   SpO2 97%   BMI 30.52 kg/m   Intake/Output Summary (Last 24 hours) at 03/31/2020 1323 Last data filed at 03/31/2020 0900 Gross per 24 hour  Intake 520 ml  Output 1300 ml  Net -780 ml   Intake/Output: I/O last 3 completed shifts: In: 520 [P.O.:480; IV Piggyback:40] Out: 2500 [Urine:2500]  Intake/Output this shift:  Total I/O In: 240 [P.O.:240] Out: -  Weight change: -2 kg Gen: NAD CVS: RRR, no rub Resp: CTA bilaterally Abd: +BS, soft, NT/ND Ext: 2+ BLE edema   Recent Labs  Lab 03/25/20 0435 03/26/20 0408 03/27/20 0428 03/28/20 0015 03/29/20 0918 03/30/20 0438 03/31/20 0430  NA 135 134* 134* 134* 134* 133* 130*  K 3.6 3.8 3.9 4.1 3.7 3.9 3.8  CL 98 98 99 99 98 99 98  CO2 25 24 25 24 23 21* 22  GLUCOSE 89 84 86 96 143* 96 89  BUN 69* 67* 78* 75* 79* 77* 76*  CREATININE 4.16* 4.34* 4.99* 5.15* 5.29* 5.26* 5.15*  ALBUMIN  --   --   --   --   --   --  3.1*  CALCIUM 7.8* 8.1* 8.0* 7.9* 8.4* 8.0* 7.9*  PHOS  --   --   --   --   --   --  5.2*   Liver Function Tests: Recent Labs  Lab 03/31/20 0430  ALBUMIN 3.1*   No results for input(s): LIPASE, AMYLASE in the last 168 hours. No results for input(s): AMMONIA in the last 168 hours. CBC: Recent Labs  Lab 03/26/20 0408 03/27/20 0428 03/28/20 0015 03/29/20 0918 03/31/20 0430  WBC 2.9* 2.5* 2.8* 4.1 2.9*  HGB 9.6* 8.5* 8.1* 8.8* 7.6*  HCT 28.8* 25.3* 24.5* 26.4* 22.9*  MCV 96.0 95.5 96.8 96.4 97.0  PLT 37* 38* 42* 59* 65*   Cardiac Enzymes: No results for input(s): CKTOTAL, CKMB, CKMBINDEX, TROPONINI in the last 168 hours. CBG: No results for input(s): GLUCAP in the last 168 hours.  Iron Studies: No results for input(s): IRON, TIBC, TRANSFERRIN, FERRITIN in the last 72  hours. Studies/Results: DG CHEST PORT 1 VIEW  Result Date: 03/29/2020 CLINICAL DATA:  Shortness of breath. EXAM: PORTABLE CHEST 1 VIEW COMPARISON:  March 21, 2020. FINDINGS: Stable cardiomegaly. Right internal jugular Port-A-Cath is unchanged in position. No pneumothorax is noted. Increased bibasilar opacities are noted concerning for worsening atelectasis or infiltrates with associated effusions. Bony thorax is unremarkable. IMPRESSION: Increased bibasilar opacities are noted concerning for worsening atelectasis or infiltrates with associated effusions. Electronically Signed   By: James  Green Jr M.D.   On: 03/29/2020 15:20   . acyclovir  200 mg Oral BID  . amLODipine  2.5 mg Oral Daily  . carvedilol  12.5 mg Oral BID WC  . Chlorhexidine Gluconate Cloth  6 each Topical Daily  . furosemide  40 mg Intravenous Once  . hydrALAZINE  100 mg Oral Q8H  . isosorbide mononitrate  30 mg Oral BID  . loratadine  10 mg Oral Daily  . sodium chloride flush  10-40 mL Intracatheter Q12H    BMET    Component Value Date/Time   NA 130 (L) 03/31/2020 0430   K 3.8   03/31/2020 0430   CL 98 03/31/2020 0430   CO2 22 03/31/2020 0430   GLUCOSE 89 03/31/2020 0430   BUN 76 (H) 03/31/2020 0430   CREATININE 5.15 (H) 03/31/2020 0430   CREATININE 1.24 (H) 03/14/2020 0858   CALCIUM 7.9 (L) 03/31/2020 0430   GFRNONAA 9 (L) 03/31/2020 0430   GFRNONAA 47 (L) 03/14/2020 0858   GFRAA 42 (L) 11/17/2019 1142   CBC    Component Value Date/Time   WBC 2.9 (L) 03/31/2020 0430   RBC 2.36 (L) 03/31/2020 0430   HGB 7.6 (L) 03/31/2020 0430   HGB 11.2 (L) 03/14/2020 0858   HCT 22.9 (L) 03/31/2020 0430   PLT 65 (L) 03/31/2020 0430   PLT 67 (L) 03/14/2020 0858   MCV 97.0 03/31/2020 0430   MCH 32.2 03/31/2020 0430   MCHC 33.2 03/31/2020 0430   RDW 17.2 (H) 03/31/2020 0430   LYMPHSABS 0.4 (L) 03/19/2020 1118   MONOABS 0.3 03/19/2020 1118   EOSABS 0.0 03/19/2020 1118   BASOSABS 0.0 03/19/2020 1118      Assessment/Plan: 1.  AKI/CKD stage 3b- initial insult occurred from 03/14/20 when Scr was 1.24 to her admission on 03/18/20 with Scr of 3.31.  DDx was volume depletion from colitis vs ATN, covid infection, myeloma kidney, TMA due to chemo, or possibly cardiorenal with new onset cardiomyopathy (EF 25-30% presumably due to cardiotoxicity of chemo).  She has had good UOP but remains volume overloaded.  Had low UNa initially with blood and protein on UA.  Renal US without obstruction.  Discussed her progressive disease and the possible need for dialysis if her renal function continues to worsen.   1. BUN/Cr stable after IV albumin followed by IV lasix 40 mg x 1 with good UOP 2. Repeat today and continue to follow BUN/Cr.  3. She is a poor candidate for long-term dialysis given her progressive myeloma and cardiomyopathy as well as her episodes of arhytmia and hypotension. 2. Progressive IgA multiple myeloma despite ongoing chemo.  Recommend discussion with Dr. Mohamed regarding prognosis and options of therapies. 3. COVID infection - felt to be incidental as she is without respiratory symptoms.  Possibly can come out of respiratory isolation per primary 4. Thrombocytopenia- transfused 5. Acute combined systolic and diastolic CHF- mainly right sided failure at this time.  Will try IV albumin/lasix as above.  Felt to be due to cardiotoxicity of carflilzomib.  1. redose IV albumin followed by IV lasix 6. Hyponatremia- related to volume overload. 7. Disposition - poor overall prognosis.  Palliative care was consulted to help set goals/limits of care, however it appears they were not open to discussions if her condition were to worsen.   Joseph A. Coladonato, MD [facility] [phone]  

## 2020-03-31 NOTE — Progress Notes (Signed)
Progress Note  Patient Name: Debbie Chan Date of Encounter: 03/31/2020  [facility] Cardiologist: Glori Bickers, MD   Subjective   Breathing stable. Some diuresis overnight with lasix/albumin per nephrology. Sodium is declining- now 130 today. BNP was up at 622.  Inpatient Medications    Scheduled Meds: . acyclovir  200 mg Oral BID  . amLODipine  2.5 mg Oral Daily  . carvedilol  12.5 mg Oral BID WC  . Chlorhexidine Gluconate Cloth  6 each Topical Daily  . hydrALAZINE  100 mg Oral Q8H  . isosorbide mononitrate  30 mg Oral BID  . loratadine  10 mg Oral Daily  . sodium chloride flush  10-40 mL Intracatheter Q12H   Continuous Infusions:  PRN Meds: acetaminophen **OR** acetaminophen, hydrALAZINE, HYDROcodone-acetaminophen, melatonin, sodium chloride, sodium chloride flush   Vital Signs    Vitals:   03/30/20 1349 03/30/20 2036 03/31/20 0500 03/31/20 0522  BP: 124/74 (!) 150/91  (!) 165/89  Pulse: 86 85  87  Resp: '20 20  18  ' Temp: 98 F (36.7 C) 98.3 F (36.8 C)  99.2 F (37.3 C)  TempSrc: Oral Oral  Oral  SpO2: 95% 96%  97%  Weight:   88.4 kg   Height:        Intake/Output Summary (Last 24 hours) at 03/31/2020 1005 Last data filed at 03/31/2020 0529 Gross per 24 hour  Intake 280 ml  Output 1600 ml  Net -1320 ml   Last 3 Weights 03/31/2020 03/30/2020 03/29/2020  Weight (lbs) 194 lb 14.2 oz 199 lb 4.7 oz 198 lb 6.6 oz  Weight (kg) 88.4 kg 90.4 kg 90 kg      Telemetry    Sinus rhythm - Personally Reviewed  ECG    n/a - Personally Reviewed  Physical Exam   VS:  BP (!) 165/89 (BP Location: Right Arm)   Pulse 87   Temp 99.2 F (37.3 C) (Oral)   Resp 18   Ht '5\' 7"'  (1.702 m)   Wt 88.4 kg   SpO2 97%   BMI 30.52 kg/m  , BMI Body mass index is 30.52 kg/m.  General appearance: alert and no distress Neck: no carotid bruit, no JVD and thyroid not enlarged, symmetric, no tenderness/mass/nodules Lungs: clear to auscultation bilaterally Heart: regular rate  and rhythm Abdomen: soft, non-tender; bowel sounds normal; no masses,  no organomegaly Extremities: edema 2+ bilateral LE edema Pulses: 2+ and symmetric Skin: Skin color, texture, turgor normal. No rashes or lesions Neurologic: Grossly normal Psych: Pleasant  Labs    High Sensitivity Troponin:  No results for input(s): TROPONINIHS in the last 720 hours.    Chemistry Recent Labs  Lab 03/29/20 0918 03/30/20 0438 03/31/20 0430  NA 134* 133* 130*  K 3.7 3.9 3.8  CL 98 99 98  CO2 23 21* 22  GLUCOSE 143* 96 89  BUN 79* 77* 76*  CREATININE 5.29* 5.26* 5.15*  CALCIUM 8.4* 8.0* 7.9*  ALBUMIN  --   --  3.1*  GFRNONAA 8* 8* 9*  ANIONGAP '13 13 10     ' Hematology Recent Labs  Lab 03/28/20 0015 03/29/20 0918 03/31/20 0430  WBC 2.8* 4.1 2.9*  RBC 2.53* 2.74* 2.36*  HGB 8.1* 8.8* 7.6*  HCT 24.5* 26.4* 22.9*  MCV 96.8 96.4 97.0  MCH 32.0 32.1 32.2  MCHC 33.1 33.3 33.2  RDW 17.0* 17.2* 17.2*  PLT 42* 59* 65*    BNP Recent Labs  Lab 03/29/20 1316  BNP 622.2*  DDimer No results for input(s): DDIMER in the last 168 hours.   Radiology    DG CHEST PORT 1 VIEW  Result Date: 03/29/2020 CLINICAL DATA:  Shortness of breath. EXAM: PORTABLE CHEST 1 VIEW COMPARISON:  March 21, 2020. FINDINGS: Stable cardiomegaly. Right internal jugular Port-A-Cath is unchanged in position. No pneumothorax is noted. Increased bibasilar opacities are noted concerning for worsening atelectasis or infiltrates with associated effusions. Bony thorax is unremarkable. IMPRESSION: Increased bibasilar opacities are noted concerning for worsening atelectasis or infiltrates with associated effusions. Electronically Signed   By: Marijo Conception M.D.   On: 03/29/2020 15:20    Cardiac Studies   03/19/20 echo  1. Left ventricular ejection fraction, by estimation, is 25 to 30%. The  left ventricle has severely decreased function. The left ventricle  demonstrates global hypokinesis although the septal LV  segments appear  slightly worse. Left ventricular diastolic  parameters are consistent with Grade II diastolic dysfunction  (pseudonormalization).  2. Right ventricular systolic function is mildly reduced. The right  ventricular size is moderately enlarged. There is moderately elevated  pulmonary artery systolic pressure.  3. The mitral valve is grossly normal. Mild mitral valve regurgitation.  4. Right atrial size was massively dilated.  5. Tricuspid valve regurgitation is moderate to severe.  6. The aortic valve is tricuspid. There is mild calcification of the  aortic valve. There is mild thickening of the aortic valve. Aortic valve  regurgitation is mild.  7. Aortic dilatation noted. 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  8. The inferior vena cava is normal in size with greater than 50%  respiratory variability, suggesting right atrial pressure of 3 mmHg.   Patient Profile     70 y.o. female with multiple myeloma not in remission, status post 11 cycles of carfilzomib and cyclophosphamide, history of paroxysmal atrial flutter 2019, history of severe tricuspid regurgitation and right heart enlargement, admitted for suspected infectious colitis, acute renal insufficiency and severe thrombocytopenia and found to have severely depressed left ventricular systolic function, new compared to 2019.  Assessment & Plan    # Acute systolic and diastolic CHF:  # HTN:  Found to have EF 25-30% on echo this admission.  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 Symptoms of CHF preceded COVID-19 infection and no other symptoms to suggest myocarditis. No anginal complaints to suggest CAD.  Consider outpatient Lexiscan Myoview given her renal dysfunction. She also had evidence of chronic right heart failure with dilated RA on prior echo in 2019 and evidence of severe TR on echo this admission.  Carvedilol increased  in the setting  of increased AVNRT episodes.  She does appear to be more volume overloaded, though with worsening renal function will wait on diuresis.  Continue carvedilol, hydralazine and Imdur.  Amlodipine was used due to lack of options in the setting of renal dysfunction. Diuresis as per nephrology- lasix and albumin recommended - good output overnight - would repeat today. Check CO-OX from port if possible.  #. Paroxysmal atrial flutter: Maintaining sinus rhythm. Anticoagulation on hold given worsening renal function and thrombocytopenia. I ntermediate to high embolic risk with a [id number] score of 4 (CHF, HTN, female, and age 44-74).  Continue carvedilol for rate control.   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 Carvedilol increased this admission with no recurrence.  # IgA multiple myeloma:  #  AoCKD stage 3:  Concerned current chemotherapy is causing cardiotoxicity.  There is concern that this is also causing her renal dysfunction.  Nephrology signed off but renal function continues to cry.  She is also getting increasingly volume overloaded.  I'm worried that if she is discharged she may develop respiratory symptoms.  Nephrology evaluated - recommended lasix and albumin. 1.2L negative overnight - creatinine mildly improved at 5.15.  - check Coox to evaluate for cardiorenal syndrome  # Thrombocytopenia: PLT count 8 on admission, improved post-transfusion and currently up to 59.     For questions or updates, please contact [place] Please consult [email or web address] for contact info under   Pixie Casino, MD, FACC, [place] Director of the [facility] Diplomate of the American Board of Clinical Lipidology Attending Cardiologist  Direct Dial: [phone]  Fax: [phone]  Website:  [email or web address]  03/31/2020 10:05 AM

## 2020-04-01 LAB — CBC
HCT: 21.8 % — ABNORMAL LOW (ref 36.0–46.0)
Hemoglobin: 7.2 g/dL — ABNORMAL LOW (ref 12.0–15.0)
MCH: 31.7 pg (ref 26.0–34.0)
MCHC: 33 g/dL (ref 30.0–36.0)
MCV: 96 fL (ref 80.0–100.0)
Platelets: 65 10*3/uL — ABNORMAL LOW (ref 150–400)
RBC: 2.27 MIL/uL — ABNORMAL LOW (ref 3.87–5.11)
RDW: 17.2 % — ABNORMAL HIGH (ref 11.5–15.5)
WBC: 3.1 10*3/uL — ABNORMAL LOW (ref 4.0–10.5)
nRBC: 0 % (ref 0.0–0.2)

## 2020-04-01 LAB — RENAL FUNCTION PANEL
Albumin: 3 g/dL — ABNORMAL LOW (ref 3.5–5.0)
Anion gap: 12 (ref 5–15)
BUN: 78 mg/dL — ABNORMAL HIGH (ref 8–23)
CO2: 23 mmol/L (ref 22–32)
Calcium: 8 mg/dL — ABNORMAL LOW (ref 8.9–10.3)
Chloride: 99 mmol/L (ref 98–111)
Creatinine, Ser: 5.27 mg/dL — ABNORMAL HIGH (ref 0.44–1.00)
GFR, Estimated: 8 mL/min — ABNORMAL LOW (ref >60)
Glucose, Bld: 91 mg/dL (ref 70–99)
Phosphorus: 5 mg/dL — ABNORMAL HIGH (ref 2.5–4.6)
Potassium: 4 mmol/L (ref 3.5–5.1)
Sodium: 134 mmol/L — ABNORMAL LOW (ref 135–145)

## 2020-04-01 LAB — IRON AND TIBC
Iron: 67 ug/dL (ref 28–170)
Saturation Ratios: 19 % (ref 10.4–31.8)
TIBC: 355 ug/dL (ref 250–450)
UIBC: 288 ug/dL

## 2020-04-01 LAB — FERRITIN: Ferritin: 437 ng/mL — ABNORMAL HIGH (ref 11–307)

## 2020-04-01 LAB — FOLATE: Folate: 6.4 ng/mL (ref >5.9)

## 2020-04-01 LAB — VITAMIN B12: Vitamin B-12: 533 pg/mL (ref 180–914)

## 2020-04-01 MED ORDER — METOLAZONE 2.5 MG PO TABS
2.5000 mg | ORAL_TABLET | Freq: Once | ORAL | Status: AC
  Administered 2020-04-01: 2.5 mg via ORAL
  Filled 2020-04-01: qty 1

## 2020-04-01 MED ORDER — ALBUMIN HUMAN 25 % IV SOLN
12.5000 g | Freq: Once | INTRAVENOUS | Status: AC
  Administered 2020-04-01: 12.5 g via INTRAVENOUS
  Filled 2020-04-01: qty 50

## 2020-04-01 MED ORDER — FUROSEMIDE 10 MG/ML IJ SOLN
40.0000 mg | Freq: Once | INTRAMUSCULAR | Status: AC
  Administered 2020-04-01: 40 mg via INTRAVENOUS
  Filled 2020-04-01: qty 4

## 2020-04-01 NOTE — Progress Notes (Signed)
Patient ID: Debbie Chan, female   DOB: September 07, 1950, 70 y.o.   MRN: [id number] S: No events overnight. O:BP (!) 142/86 (BP Location: Right Arm)   Pulse 80   Temp 98.5 F (36.9 C) (Oral)   Resp 19   Ht _0  (1.702 m)   Wt 88.5 kg   SpO2 100%   BMI 30.54 kg/m   Intake/Output Summary (Last 24 hours) at 04/01/2020 0925 Last data filed at 04/01/2020 0600 Gross per 24 hour  Intake 930 ml  Output 850 ml  Net 80 ml   Intake/Output: I/O last 3 completed shifts: In: 1791 [P.O.:1120; IV Piggyback:50] Out: 1850 [Urine:1850]  Intake/Output this shift:  No intake/output data recorded. Weight change: 0.05 kg  Physical exam: unable to complete due to COVID + status.  In order to preserve PPE equipment and to minimize exposure to providers.  Notes from other caregivers reviewed   Recent Labs  Lab 03/26/20 0408 03/27/20 0428 03/28/20 0015 03/29/20 0918 03/30/20 0438 03/31/20 0430 04/01/20 0517  NA 134* 134* 134* 134* 133* 130* 134*  K 3.8 3.9 4.1 3.7 3.9 3.8 4.0  CL 98 99 99 98 99 98 99  CO2 _1 21* 22 23  GLUCOSE 84 86 96 143* 96 89 91  BUN 67* 78* 75* 79* 77* 76* 78*  CREATININE 4.34* 4.99* 5.15* 5.29* 5.26* 5.15* 5.27*  ALBUMIN  --   --   --   --   --  3.1* 3.0*  CALCIUM 8.1* 8.0* 7.9* 8.4* 8.0* 7.9* 8.0*  PHOS  --   --   --   --   --  5.2* 5.0*   Liver Function Tests: Recent Labs  Lab 03/31/20 0430 04/01/20 0517  ALBUMIN 3.1* 3.0*   No results for input(s): LIPASE, AMYLASE in the last 168 hours. No results for input(s): AMMONIA in the last 168 hours. CBC: Recent Labs  Lab 03/27/20 0428 03/28/20 0015 03/29/20 0918 03/31/20 0430 04/01/20 0517  WBC 2.5* 2.8* 4.1 2.9* 3.1*  HGB 8.5* 8.1* 8.8* 7.6* 7.2*  HCT 25.3* 24.5* 26.4* 22.9* 21.8*  MCV 95.5 96.8 96.4 97.0 96.0  PLT 38* 42* 59* 65* 65*   Cardiac Enzymes: No results for input(s): CKTOTAL, CKMB, CKMBINDEX, TROPONINI in the last 168 hours. CBG: No results for input(s): GLUCAP in the last 168  hours.  Iron Studies: No results for input(s): IRON, TIBC, TRANSFERRIN, FERRITIN in the last 72 hours. Studies/Results: No results found. Marland Kitchen acyclovir  200 mg Oral BID  . amLODipine  2.5 mg Oral Daily  . carvedilol  12.5 mg Oral BID WC  . Chlorhexidine Gluconate Cloth  6 each Topical Daily  . hydrALAZINE  100 mg Oral Q8H  . isosorbide mononitrate  30 mg Oral BID  . loratadine  10 mg Oral Daily  . sodium chloride flush  10-40 mL Intracatheter Q12H    BMET    Component Value Date/Time   NA 134 (L) 04/01/2020 0517   K 4.0 04/01/2020 0517   CL 99 04/01/2020 0517   CO2 23 04/01/2020 0517   GLUCOSE 91 04/01/2020 0517   BUN 78 (H) 04/01/2020 0517   CREATININE 5.27 (H) 04/01/2020 0517   CREATININE 1.24 (H) 03/14/2020 0858   CALCIUM 8.0 (L) 04/01/2020 0517   GFRNONAA 8 (L) 04/01/2020 0517   GFRNONAA 47 (L) 03/14/2020 0858   GFRAA 42 (L) 11/17/2019 1142   CBC    Component Value Date/Time   WBC 3.1 (L) 04/01/2020 5056  RBC 2.27 (L) 04/01/2020 0517   HGB 7.2 (L) 04/01/2020 0517   HGB 11.2 (L) 03/14/2020 0858   HCT 21.8 (L) 04/01/2020 0517   PLT 65 (L) 04/01/2020 0517   PLT 67 (L) 03/14/2020 0858   MCV 96.0 04/01/2020 0517   MCH 31.7 04/01/2020 0517   MCHC 33.0 04/01/2020 0517   RDW 17.2 (H) 04/01/2020 0517   LYMPHSABS 0.4 (L) 03/19/2020 1118   MONOABS 0.3 03/19/2020 1118   EOSABS 0.0 03/19/2020 1118   BASOSABS 0.0 03/19/2020 1118      Assessment/Plan: 1. AKI/CKD stage 3b- initial insult occurred from 03/14/20 when Scr was 1.24 to her admission on 03/18/20 with Scr of 3.31. DDx was volume depletion from colitis vs ATN, covid infection, myeloma kidney, TMA due to chemo, or possibly cardiorenal with new onset cardiomyopathy (EF 25-30% presumably due to cardiotoxicity of chemo). She has had good UOP but remains volume overloaded. Had low UNa initially with blood and protein on UA. Renal US without obstruction. Discussed her progressive disease and the possible need for  dialysis if her renal function continues to worsen.  1. BUN/Cr stable after 2 days of IV albumin followed by IV lasix 40 mg x 1 with good UOP initially not as good after 2nd dose. 2. Repeat today and continue to follow BUN/Cr.  3. She is a poor candidate for long-term dialysis given her progressive myeloma and cardiomyopathy as well as her episodes of arhytmia and hypotension. 2. Progressive IgA multiple myeloma despite ongoing chemo. Recommend discussion with Dr. Julien Nordmann regarding prognosis and options of therapies. 3. COVID infection - felt to be incidental as she is without respiratory symptoms. Possibly can come out of respiratory isolation per primary 4. Thrombocytopenia- transfused 5. Acute combined systolic and diastolic CHF- mainly right sided failure at this time. Will try IV albumin/lasix as above. Felt to be due to cardiotoxicity of carflilzomib.  1. redose IV albumin followed by IV lasix 6. Hyponatremia- related to volume overload.  Improving with diuresis.  7. Disposition - poor overall prognosis. Palliative care was consulted to help set goals/limits of care, however it appears they were not open to discussions if her condition were to worsen.  Donetta Potts, MD [employer] [phone]

## 2020-04-01 NOTE — Progress Notes (Signed)
PROGRESS NOTE  Debbie Chan VEL:[id number] DOB: 03-03-1950 DOA: 03/18/2020 PCP: [name], MD  HPI/Recap of past 24 hours: Debbie Chan is a 70 year old female with past medical history significant for multiple myeloma, atrial flutter, chronic anemia who presented to the hospital with complaints of weakness.  She was found to have severe thrombocytopenia and AKI.  She was given IV fluid, platelet transfusions.  She was also incidentally found to have COVID-19 viral infection. Hospitalization complicated by acute renal failure as well as acute heart failure. Nephrology and cardiology have been following.   Volume overload on exam.  Nephrology consulted to assist with diuresing in the setting of AKI on CKD 3B.  Trial of IV albumin followed by IV Lasix 40 mg x 2 doses per nephrology.   Mild uptrend of creatinine 5.27 from 5.15.  04/01/20: Seen and examined at bedside.  She has no new complaints.  Still volume overload on exam but improved after diuresing.  She has less urine output after second dose of albumin/Lasix.  Assessment/Plan: Active Problems:   Anemia   Acute renal failure superimposed on stage 3b chronic kidney disease (HCC)   Atrial flutter (HCC)   Multiple myeloma without remission (HCC)   Thrombocytopenia (HCC)   Colitis   Hematuria   Polycystic kidney   Hyponatremia   Dehydration  Nonoliguric AKI on CKD stage IIIb likely multifactorial secondary to dehydration, myeloma kidney, heart failure. 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 Nephrology was reconsulted on 03/30/2020 (signed off on 03/25/20) to assist with diuresing in the setting of AKI on CKD 3B. Creatinine is mildly uptrending 5.27 from 5.15 from 5.26 from 5.29 from 5.15 from 4.99. Continue to avoid nephrotoxins, dehydration and hypotension. Good urine output,  1800 cc recorded last 24 hours. Continue daily renal panel.  Acute systolic heart failure -Elevated BNP greater than 1800, bilateral lower extremity edema. -Suspect cardiomyopathy could be secondary to cardiotoxic effects from carfilzomib  Currently on Norvasc 2.5 mg daily, Coreg 12.5 mg twice daily, p.o. hydralazine 100 mg 3 times daily, Imdur 30 mg twice daily. Received a trial of IV albumin followed by IV Lasix 40 mg x 1 per nephrology on 03/30/2020 with positive results, repeated on 03/31/20 with lower urine output.  Continue strict I's and daily weight Net I&O -3.4 L  Bilateral lower extremity edema Improving with diuresis Continue to elevate extremities  COVID-19 viral infection -Incidental finding, tested positive 03/19/20.   Oxygen saturation 100% on room air.  Thrombocytopenia, improving -Presented with platelet 8000 on admission.  Transfused 1 unit platelets -Hematology/oncology following.  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  Transfuse for platelets <20,000 for acute bleeding -Platelet count 65K from 59K from 42K, uptrending.  Treated acute colitis -GI PCR negative -CT abdomen pelvis reveals colitis of the distal descending and sigmoid colon -Completed antibiotic therapy  Multiple myeloma -Follows with [name]-Completed course of Decadron  Paroxysmal atrial flutter -CHA2DS2-VASc 4 -Anticoagulation currently on hold due to thrombocytopenia  Hypertension BP is currently at goal on regimen stated above. Continue to monitor vital signs.  Polycystic morphology kidneys -Follow-up outpatient    DVT prophylaxis:  SCDs Start: 03/19/20 1051  Code Status: Full code Family Communication:  Updated her son via telephone 03/30/2020.  Consults: Cardiology Nephrology.   Status is: Inpatient    Dispo: The patient is from: Home.  Anticipated d/c is to: Home.              Anticipated d/c date  is: 04/02/2020.              Patient currently not stable for discharge, ongoing diuresing.   Difficult to place patient N/A        Objective: Vitals:   04/01/20 0500 04/01/20 0536 04/01/20 1008 04/01/20 1418  BP:  (!) 142/86 (!) 154/76 124/75  Pulse:  80 84 75  Resp:  19  18  Temp:  98.5 F (36.9 C)  97.6 F (36.4 C)  TempSrc:  Oral  Oral  SpO2:  100%  100%  Weight: 88.5 kg     Height:        Intake/Output Summary (Last 24 hours) at 04/01/2020 1636 Last data filed at 04/01/2020 1433 Gross per 24 hour  Intake 400 ml  Output 1350 ml  Net -950 ml   Filed Weights   03/30/20 0508 03/31/20 0500 04/01/20 0500  Weight: 90.4 kg 88.4 kg 88.5 kg    Exam:  . General: 70 y.o. year-old female pleasant well-developed well-nourished in no acute distress.  Alert and oriented x3. . Cardiovascular: Regular rate and rhythm no murmur gallop. Marland Kitchen Respiratory: Clear to auscultation no wheezing no rales.   . Abdomen: Soft normal bowel sounds present.   . Musculoskeletal: 2+ pitting edema in lower extremities bilaterally, improving. . Skin: No ulcerative lesions noted. Marland Kitchen Psychiatry: Mood is appropriate for condition and setting.  Data Reviewed: CBC: Recent Labs  Lab 03/27/20 0428 03/28/20 0015 03/29/20 0918 03/31/20 0430 04/01/20 0517  WBC 2.5* 2.8* 4.1 2.9* 3.1*  HGB 8.5* 8.1* 8.8* 7.6* 7.2*  HCT 25.3* 24.5* 26.4* 22.9* 21.8*  MCV 95.5 96.8 96.4 97.0 96.0  PLT 38* 42* 59* 65* 65*   Basic Metabolic Panel: Recent Labs  Lab 03/28/20 0015 03/29/20 0918 03/30/20 0438 03/31/20 0430 04/01/20 0517  NA 134* 134* 133* 130* 134*  K 4.1 3.7 3.9 3.8 4.0  CL 99 98 99 98 99  CO2 24 23 21* 22 23  GLUCOSE 96 143* 96 89 91  BUN 75* 79* 77* 76* 78*  CREATININE 5.15* 5.29* 5.26* 5.15* 5.27*  CALCIUM 7.9* 8.4* 8.0* 7.9* 8.0*  PHOS  --   --   --  5.2* 5.0*   GFR: Estimated Creatinine Clearance: 11.5 mL/min (A) (by C-G formula based on SCr of 5.27 mg/dL (H)). Liver Function Tests: Recent  Labs  Lab 03/31/20 0430 04/01/20 0517  ALBUMIN 3.1* 3.0*   No results for input(s): LIPASE, AMYLASE in the last 168 hours. No results for input(s): AMMONIA in the last 168 hours. Coagulation Profile: No results for input(s): INR, PROTIME in the last 168 hours. Cardiac Enzymes: No results for input(s): CKTOTAL, CKMB, CKMBINDEX, TROPONINI in the last 168 hours. BNP (last 3 results) No results for input(s): PROBNP in the last 8760 hours. HbA1C: No results for input(s): HGBA1C in the last 72 hours. CBG: No results for input(s): GLUCAP in the last 168 hours. Lipid Profile: No results for input(s): CHOL, HDL, LDLCALC, TRIG, CHOLHDL, LDLDIRECT in the last 72 hours. Thyroid Function Tests: No results for input(s): TSH, T4TOTAL, FREET4, T3FREE, THYROIDAB in the last 72 hours. Anemia Panel: Recent Labs    04/01/20 0630  VITAMINB12 533  FOLATE 6.4  FERRITIN 437*  TIBC 355  IRON 67   Urine analysis:    Component Value Date/Time   COLORURINE YELLOW 03/30/2020 [place] 03/30/2020  1626   LABSPEC 1.009 03/30/2020 1626   PHURINE 6.0 03/30/2020 1626   GLUCOSEU NEGATIVE 03/30/2020 1626   HGBUR SMALL (A) 03/30/2020 1626   BILIRUBINUR NEGATIVE 03/30/2020 1626   KETONESUR NEGATIVE 03/30/2020 1626   PROTEINUR 100 (A) 03/30/2020 1626   NITRITE NEGATIVE 03/30/2020 1626   LEUKOCYTESUR NEGATIVE 03/30/2020 1626   Sepsis Labs: _0 (procalcitonin:4,lacticidven:4)  ) No results found for this or any previous visit (from the past 240 hour(s)).    Studies: No results found.  Scheduled Meds: . acyclovir  200 mg Oral BID  . amLODipine  2.5 mg Oral Daily  . carvedilol  12.5 mg Oral BID WC  . Chlorhexidine Gluconate Cloth  6 each Topical Daily  . hydrALAZINE  100 mg Oral Q8H  . isosorbide mononitrate  30 mg Oral BID  . loratadine  10 mg Oral Daily  . sodium chloride flush  10-40 mL Intracatheter Q12H    Continuous Infusions:   LOS: 14 days     Kayleen Memos, MD [facility] Pager [phone]  If 7PM-7AM, please contact night-coverage [email or web address] Password [facility] 04/01/2020, 4:36 PM

## 2020-04-01 NOTE — Plan of Care (Signed)
  Problem: Education: Goal: Knowledge of risk factors and measures for prevention of condition will improve Outcome: Progressing   Problem: Coping: Goal: Psychosocial and spiritual needs will be supported Outcome: Progressing   Problem: Respiratory: Goal: Will maintain a patent airway Outcome: Progressing Goal: Complications related to the disease process, condition or treatment will be avoided or minimized Outcome: Progressing   

## 2020-04-01 NOTE — Progress Notes (Signed)
Progress Note  Patient Name: Debbie Chan Date of Encounter: 04/01/2020  [facility] Cardiologist: Glori Bickers, MD   Subjective   Spoke with the patient by phone. Says she is swelling more today. Slightly positive recorded yesterday. Nephrology has evaluated today, creatinine is up slightly -may be around baseline. BNP 622. Sodium is near normal today-  Plan is for continued diuresis. Co-ox ordered but not drawn.  Inpatient Medications    Scheduled Meds: . acyclovir  200 mg Oral BID  . amLODipine  2.5 mg Oral Daily  . carvedilol  12.5 mg Oral BID WC  . Chlorhexidine Gluconate Cloth  6 each Topical Daily  . furosemide  40 mg Intravenous Once  . hydrALAZINE  100 mg Oral Q8H  . isosorbide mononitrate  30 mg Oral BID  . loratadine  10 mg Oral Daily  . sodium chloride flush  10-40 mL Intracatheter Q12H   Continuous Infusions: . albumin human     PRN Meds: acetaminophen **OR** acetaminophen, hydrALAZINE, HYDROcodone-acetaminophen, melatonin, sodium chloride, sodium chloride flush   Vital Signs    Vitals:   03/31/20 2104 04/01/20 0500 04/01/20 0536 04/01/20 1008  BP: 126/70  (!) 142/86 (!) 154/76  Pulse: 77  80 84  Resp: 16  19   Temp: 98.2 F (36.8 C)  98.5 F (36.9 C)   TempSrc: Oral  Oral   SpO2: 97%  100%   Weight:  88.5 kg    Height:        Intake/Output Summary (Last 24 hours) at 04/01/2020 1012 Last data filed at 04/01/2020 0600 Gross per 24 hour  Intake 930 ml  Output 850 ml  Net 80 ml   Last 3 Weights 04/01/2020 03/31/2020 03/30/2020  Weight (lbs) 195 lb 194 lb 14.2 oz 199 lb 4.7 oz  Weight (kg) 88.45 kg 88.4 kg 90.4 kg      Telemetry    Sinus rhythm - Personally Reviewed  ECG    n/a - Personally Reviewed  Physical Exam   VS:  BP (!) 154/76   Pulse 84   Temp 98.5 F (36.9 C) (Oral)   Resp 19   Ht _0  (1.702 m)   Wt 88.5 kg   SpO2 100%   BMI 30.54 kg/m  , BMI Body mass index is 30.54 kg/m.  Due to the current COVID-19 pandemic and the  desire to minimize use of PPE and reduce provider exposure, the patient exam was deferred or conducted via video or teleconference.  Labs    High Sensitivity Troponin:  No results for input(s): TROPONINIHS in the last 720 hours.    Chemistry Recent Labs  Lab 03/30/20 0438 03/31/20 0430 04/01/20 0517  NA 133* 130* 134*  K 3.9 3.8 4.0  CL 99 98 99  CO2 21* 22 23  GLUCOSE 96 89 91  BUN 77* 76* 78*  CREATININE 5.26* 5.15* 5.27*  CALCIUM 8.0* 7.9* 8.0*  ALBUMIN  --  3.1* 3.0*  GFRNONAA 8* 9* 8*  ANIONGAP _1 Hematology Recent Labs  Lab 03/29/20 0918 03/31/20 0430 04/01/20 0517  WBC 4.1 2.9* 3.1*  RBC 2.74* 2.36* 2.27*  HGB 8.8* 7.6* 7.2*  HCT 26.4* 22.9* 21.8*  MCV 96.4 97.0 96.0  MCH 32.1 32.2 31.7  MCHC 33.3 33.2 33.0  RDW 17.2* 17.2* 17.2*  PLT 59* 65* 65*    BNP Recent Labs  Lab 03/29/20 1316  BNP 622.2*     DDimer No results for input(s): DDIMER  in the last 168 hours.   Radiology    No results found.  Cardiac Studies   03/19/20 echo  1. Left ventricular ejection fraction, by estimation, is 25 to 30%. The  left ventricle has severely decreased function. The left ventricle  demonstrates global hypokinesis although the septal LV segments appear  slightly worse. Left ventricular diastolic  parameters are consistent with Grade II diastolic dysfunction  (pseudonormalization).  2. Right ventricular systolic function is mildly reduced. The right  ventricular size is moderately enlarged. There is moderately elevated  pulmonary artery systolic pressure.  3. The mitral valve is grossly normal. Mild mitral valve regurgitation.  4. Right atrial size was massively dilated.  5. Tricuspid valve regurgitation is moderate to severe.  6. The aortic valve is tricuspid. There is mild calcification of the  aortic valve. There is mild thickening of the aortic valve. Aortic valve  regurgitation is mild.  7. Aortic dilatation noted. There is mild  dilatation of the ascending  aorta, measuring 36 mm.  8. The inferior vena cava is normal in size with greater than 50%  respiratory variability, suggesting right atrial pressure of 3 mmHg.   Patient Profile     70 y.o. female with multiple myeloma not in remission, status post 11 cycles of carfilzomib and cyclophosphamide, history of paroxysmal atrial flutter 2019, history of severe tricuspid regurgitation and right heart enlargement, admitted for suspected infectious colitis, acute renal insufficiency and severe thrombocytopenia and found to have severely depressed left ventricular systolic function, new compared to 2019.  Assessment & Plan    # Acute systolic and diastolic CHF:  # HTN:  Found to have EF 25-30% on echo this admission.  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 Symptoms of CHF preceded COVID-19 infection and no other symptoms to suggest myocarditis. No anginal complaints to suggest CAD.  Consider outpatient Lexiscan Myoview given her renal dysfunction. She also had evidence of chronic right heart failure with dilated RA on prior echo in 2019 and evidence of severe TR on echo this admission.  Carvedilol increased  in the setting of increased AVNRT episodes.  She does appear to be more volume overloaded, though with worsening renal function will wait on diuresis.  Continue carvedilol, hydralazine and Imdur.  Amlodipine was used due to lack of options in the setting of renal dysfunction. Diuresis as per nephrology- lasix and albumin recommended - urine output slowed overnight. Co-ox ordered, but there was apparently an issue with the sample and it did not result. IV lasix and albumin ordered again - will give a single dose of metolazone today as well.  #. Paroxysmal atrial flutter: Maintaining sinus rhythm. Anticoagulation on hold given worsening renal function and thrombocytopenia. I ntermediate to high embolic risk with a [id number] score  of 4 (CHF, HTN, female, and age 3-74).  Continue carvedilol for rate control.   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 Carvedilol increased this admission with no recurrence.  # IgA multiple myeloma:  # AoCKD stage 3:  Concerned current chemotherapy is causing cardiotoxicity.  There is concern that this is also causing her renal dysfunction.  Nephrology signed off but renal function continues to cry.  She is also getting increasingly volume overloaded.  I'm worried that if she is discharged she may develop respiratory symptoms.  Nephrology evaluated - recommended lasix and albumin. 1.2L negative overnight - creatinine mildly improved  at 5.15.  - check Coox to evaluate for cardiorenal syndrome  # Thrombocytopenia: PLT count 8 on admission, improved post-transfusion and currently up to 59.     For questions or updates, please contact [place] Please consult [email or web address] for contact info under   Pixie Casino, MD, FACC, [place] Director of the [facility] Diplomate of the American Board of Clinical Lipidology Attending Cardiologist  Direct Dial: [phone]  Fax: [phone]  Website:  [email or web address]  04/01/2020 10:12 AM

## 2020-04-02 DIAGNOSIS — I5041 Acute combined systolic (congestive) and diastolic (congestive) heart failure: Secondary | ICD-10-CM

## 2020-04-02 LAB — CBC
HCT: 22.6 % — ABNORMAL LOW (ref 36.0–46.0)
Hemoglobin: 7.4 g/dL — ABNORMAL LOW (ref 12.0–15.0)
MCH: 31.6 pg (ref 26.0–34.0)
MCHC: 32.7 g/dL (ref 30.0–36.0)
MCV: 96.6 fL (ref 80.0–100.0)
Platelets: 72 10*3/uL — ABNORMAL LOW (ref 150–400)
RBC: 2.34 MIL/uL — ABNORMAL LOW (ref 3.87–5.11)
RDW: 17.2 % — ABNORMAL HIGH (ref 11.5–15.5)
WBC: 2.7 10*3/uL — ABNORMAL LOW (ref 4.0–10.5)
nRBC: 0 % (ref 0.0–0.2)

## 2020-04-02 LAB — RENAL FUNCTION PANEL
Albumin: 3.2 g/dL — ABNORMAL LOW (ref 3.5–5.0)
Anion gap: 15 (ref 5–15)
BUN: 73 mg/dL — ABNORMAL HIGH (ref 8–23)
CO2: 22 mmol/L (ref 22–32)
Calcium: 8.2 mg/dL — ABNORMAL LOW (ref 8.9–10.3)
Chloride: 93 mmol/L — ABNORMAL LOW (ref 98–111)
Creatinine, Ser: 5.01 mg/dL — ABNORMAL HIGH (ref 0.44–1.00)
GFR, Estimated: 9 mL/min — ABNORMAL LOW (ref >60)
Glucose, Bld: 86 mg/dL (ref 70–99)
Phosphorus: 5.2 mg/dL — ABNORMAL HIGH (ref 2.5–4.6)
Potassium: 3.9 mmol/L (ref 3.5–5.1)
Sodium: 130 mmol/L — ABNORMAL LOW (ref 135–145)

## 2020-04-02 LAB — IMMUNOFIXATION, URINE

## 2020-04-02 MED ORDER — FUROSEMIDE 40 MG PO TABS
40.0000 mg | ORAL_TABLET | Freq: Two times a day (BID) | ORAL | Status: DC
  Administered 2020-04-02 – 2020-04-04 (×4): 40 mg via ORAL
  Filled 2020-04-02 (×4): qty 1

## 2020-04-02 MED ORDER — FERROUS SULFATE 325 (65 FE) MG PO TABS
325.0000 mg | ORAL_TABLET | Freq: Every day | ORAL | Status: DC
Start: 2020-04-02 — End: 2020-04-05
  Administered 2020-04-02 – 2020-04-04 (×3): 325 mg via ORAL
  Filled 2020-04-02 (×3): qty 1

## 2020-04-02 MED ORDER — SENNOSIDES-DOCUSATE SODIUM 8.6-50 MG PO TABS
1.0000 | ORAL_TABLET | Freq: Every day | ORAL | Status: DC
  Administered 2020-04-02 – 2020-04-04 (×3): 1 via ORAL
  Filled 2020-04-02 (×3): qty 1

## 2020-04-02 NOTE — Progress Notes (Addendum)
 Progress Note  Patient Name: Debbie Chan Date of Encounter: 04/02/2020  [facility] Cardiologist: Daniel Bensimhon, MD   Subjective   Mild improvement in renal function (Cr 5.3 >5.0). Denies any chest pain or dyspnea  Inpatient Medications    Scheduled Meds: . acyclovir  200 mg Oral BID  . amLODipine  2.5 mg Oral Daily  . carvedilol  12.5 mg Oral BID WC  . Chlorhexidine Gluconate Cloth  6 each Topical Daily  . ferrous sulfate  325 mg Oral Q breakfast  . hydrALAZINE  100 mg Oral Q8H  . isosorbide mononitrate  30 mg Oral BID  . loratadine  10 mg Oral Daily  . senna-docusate  1 tablet Oral Daily  . sodium chloride flush  10-40 mL Intracatheter Q12H   Continuous Infusions:  PRN Meds: acetaminophen **OR** acetaminophen, hydrALAZINE, HYDROcodone-acetaminophen, melatonin, sodium chloride, sodium chloride flush   Vital Signs    Vitals:   04/01/20 2141 04/02/20 0602 04/02/20 0751 04/02/20 1338  BP: (!) 150/83 (!) 160/88 (!) 152/79 136/72  Pulse: 79 82 84 83  Resp: 19 20  18  Temp: (!) 97.4 F (36.3 C) 98.2 F (36.8 C)  98.2 F (36.8 C)  TempSrc: Oral Oral  Oral  SpO2: 100% 100%  98%  Weight:      Height:        Intake/Output Summary (Last 24 hours) at 04/02/2020 1533 Last data filed at 04/02/2020 0811 Gross per 24 hour  Intake 760 ml  Output 1200 ml  Net -440 ml   Last 3 Weights 04/01/2020 03/31/2020 03/30/2020  Weight (lbs) 195 lb 194 lb 14.2 oz 199 lb 4.7 oz  Weight (kg) 88.45 kg 88.4 kg 90.4 kg      Telemetry    Sinus rhythm - Personally Reviewed  ECG    n/a - Personally Reviewed  Physical Exam   VS:  BP 136/72 (BP Location: Right Arm)   Pulse 83   Temp 98.2 F (36.8 C) (Oral)   Resp 18   Ht 5' 7" (1.702 m)   Wt 88.5 kg   SpO2 98%   BMI 30.54 kg/m  , BMI Body mass index is 30.54 kg/m.  GEN: in no acute distress HEENT: normal Neck: no JVD appreciated Cardiac: RRR; no murmurs Respiratory:  clear to auscultation bilaterally, normal work of  breathing GI: soft, nontender MS: 2+ BLE edema Skin: warm and dry, no rash Neuro:  Alert and Oriented x 3, Strength and sensation are intact Psych: euthymic mood, full affect    Labs    High Sensitivity Troponin:  No results for input(s): TROPONINIHS in the last 720 hours.    Chemistry Recent Labs  Lab 03/31/20 0430 04/01/20 0517 04/02/20 0445  NA 130* 134* 130*  K 3.8 4.0 3.9  CL 98 99 93*  CO2 22 23 22  GLUCOSE 89 91 86  BUN 76* 78* 73*  CREATININE 5.15* 5.27* 5.01*  CALCIUM 7.9* 8.0* 8.2*  ALBUMIN 3.1* 3.0* 3.2*  GFRNONAA 9* 8* 9*  ANIONGAP 10 12 15     Hematology Recent Labs  Lab 03/31/20 0430 04/01/20 0517 04/02/20 0445  WBC 2.9* 3.1* 2.7*  RBC 2.36* 2.27* 2.34*  HGB 7.6* 7.2* 7.4*  HCT 22.9* 21.8* 22.6*  MCV 97.0 96.0 96.6  MCH 32.2 31.7 31.6  MCHC 33.2 33.0 32.7  RDW 17.2* 17.2* 17.2*  PLT 65* 65* 72*    BNP Recent Labs  Lab 03/29/20 1316  BNP 622.2*     DDimer   No results for input(s): DDIMER in the last 168 hours.   Radiology    No results found.  Cardiac Studies   03/19/20 echo  1. Left ventricular ejection fraction, by estimation, is 25 to 30%. The  left ventricle has severely decreased function. The left ventricle  demonstrates global hypokinesis although the septal LV segments appear  slightly worse. Left ventricular diastolic  parameters are consistent with Grade II diastolic dysfunction  (pseudonormalization).  2. Right ventricular systolic function is mildly reduced. The right  ventricular size is moderately enlarged. There is moderately elevated  pulmonary artery systolic pressure.  3. The mitral valve is grossly normal. Mild mitral valve regurgitation.  4. Right atrial size was massively dilated.  5. Tricuspid valve regurgitation is moderate to severe.  6. The aortic valve is tricuspid. There is mild calcification of the  aortic valve. There is mild thickening of the aortic valve. Aortic valve  regurgitation is  mild.  7. Aortic dilatation noted. 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  8. The inferior vena cava is normal in size with greater than 50%  respiratory variability, suggesting right atrial pressure of 3 mmHg.   Patient Profile     70 y.o. female with multiple myeloma not in remission, status post 11 cycles of carfilzomib and cyclophosphamide, history of paroxysmal atrial flutter 2019, history of severe tricuspid regurgitation and right heart enlargement, admitted for suspected infectious colitis, acute renal insufficiency and severe thrombocytopenia and found to have severely depressed left ventricular systolic function, new compared to 2019.  Assessment & Plan    # Acute systolic and diastolic CHF:  # HTN:  Found to have EF 25-30% on echo this admission.  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 Symptoms of CHF preceded COVID-19 infection and no other symptoms to suggest myocarditis. No anginal complaints to suggest CAD.  Consider outpatient Lexiscan Myoview given her renal dysfunction. She also had evidence of chronic right heart failure with dilated RA on prior echo in 2019 and evidence of severe TR on echo this admission.  Carvedilol increased  in the setting of increased AVNRT episodes.  She does appear to be volume overloaded.  Continue carvedilol, hydralazine and Imdur.  Amlodipine was used due to lack of options in the setting of renal dysfunction. Diuresis per nephrology- lasix and albumin recommended   #. Paroxysmal atrial flutter: Maintaining sinus rhythm. Anticoagulation on hold given worsening renal function and thrombocytopenia. Intermediate to high embolic risk with a CHA2DS2-VASc score of 4 (CHF, HTN, female, and age 70-74).  Continue carvedilol for rate control.   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 Carvedilol increased this  admission with no recurrence.  # IgA multiple myeloma:  # AoCKD stage 3:  Concerned current chemotherapy is causing cardiotoxicity.  There is concern that this is also causing her renal dysfunction.  Nephrology evaluated. On lasix/albumin. Creatinine mildly improved at 5.0.   # Thrombocytopenia: PLT count 8 on admission, improved post-transfusion and currently up to 72.     For questions or updates, please contact [facility] Please consult [email or web address] for contact info under   Christopher L Schumann, MD  04/02/2020 3:33 PM  

## 2020-04-02 NOTE — Progress Notes (Signed)
Updated patients son on plan of care.

## 2020-04-02 NOTE — Progress Notes (Signed)
Patient ID: Lenna Salem, female   DOB: 01/11/1951, 70 y.o.   MRN: [id number] S: UOP 1700 yest, creat 5.0, eGFR 9 ml/min. Pt up in chair.   O:BP 136/72 (BP Location: Right Arm)   Pulse 83   Temp 98.2 F (36.8 C) (Oral)   Resp 18   Ht 5' 7" (1.702 m)   Wt 88.5 kg   SpO2 98%   BMI 30.54 kg/m   Intake/Output Summary (Last 24 hours) at 04/02/2020 1614 Last data filed at 04/02/2020 0811 Gross per 24 hour  Intake 760 ml  Output 1200 ml  Net -440 ml   Intake/Output: I/O last 3 completed shifts: In: 1760 [P.O.:1760] Out: 2250 [Urine:2250]  Intake/Output this shift:  Total I/O In: -  Out: 300 [Urine:300] Weight change:   Exam:   alert, nad   no jvd  Chest cta bilat  Cor reg no RG  Abd soft ntnd no ascites   Ext 1-2 + pitting LE edema   Alert, NF, ox3    Recent Labs  Lab 03/27/20 0428 03/28/20 0015 03/29/20 0918 03/30/20 0438 03/31/20 0430 04/01/20 0517 04/02/20 0445  NA 134* 134* 134* 133* 130* 134* 130*  K 3.9 4.1 3.7 3.9 3.8 4.0 3.9  CL 99 99 98 99 98 99 93*  CO2 25 24 23 21* 22 23 22  GLUCOSE 86 96 143* 96 89 91 86  BUN 78* 75* 79* 77* 76* 78* 73*  CREATININE 4.99* 5.15* 5.29* 5.26* 5.15* 5.27* 5.01*  ALBUMIN  --   --   --   --  3.1* 3.0* 3.2*  CALCIUM 8.0* 7.9* 8.4* 8.0* 7.9* 8.0* 8.2*  PHOS  --   --   --   --  5.2* 5.0* 5.2*   Liver Function Tests: Recent Labs  Lab 03/31/20 0430 04/01/20 0517 04/02/20 0445  ALBUMIN 3.1* 3.0* 3.2*   No results for input(s): LIPASE, AMYLASE in the last 168 hours. No results for input(s): AMMONIA in the last 168 hours. CBC: Recent Labs  Lab 03/28/20 0015 03/29/20 0918 03/31/20 0430 04/01/20 0517 04/02/20 0445  WBC 2.8* 4.1 2.9* 3.1* 2.7*  HGB 8.1* 8.8* 7.6* 7.2* 7.4*  HCT 24.5* 26.4* 22.9* 21.8* 22.6*  MCV 96.8 96.4 97.0 96.0 96.6  PLT 42* 59* 65* 65* 72*   Cardiac Enzymes: No results for input(s): CKTOTAL, CKMB, CKMBINDEX, TROPONINI in the last 168 hours. CBG: No results for input(s): GLUCAP in the last 168  hours.  Iron Studies:  Recent Labs    04/01/20 0630  IRON 67  TIBC 355  FERRITIN 437*   Studies/Results: No results found. . acyclovir  200 mg Oral BID  . amLODipine  2.5 mg Oral Daily  . carvedilol  12.5 mg Oral BID WC  . Chlorhexidine Gluconate Cloth  6 each Topical Daily  . ferrous sulfate  325 mg Oral Q breakfast  . hydrALAZINE  100 mg Oral Q8H  . isosorbide mononitrate  30 mg Oral BID  . loratadine  10 mg Oral Daily  . senna-docusate  1 tablet Oral Daily  . sodium chloride flush  10-40 mL Intracatheter Q12H    BMET    Component Value Date/Time   NA 130 (L) 04/02/2020 0445   K 3.9 04/02/2020 0445   CL 93 (L) 04/02/2020 0445   CO2 22 04/02/2020 0445   GLUCOSE 86 04/02/2020 0445   BUN 73 (H) 04/02/2020 0445   CREATININE 5.01 (H) 04/02/2020 0445   CREATININE 1.24 (H) 03/14/2020 0858     CALCIUM 8.2 (L) 04/02/2020 0445   GFRNONAA 9 (L) 04/02/2020 0445   GFRNONAA 47 (L) 03/14/2020 0858   GFRAA 42 (L) 11/17/2019 1142   CBC    Component Value Date/Time   WBC 2.7 (L) 04/02/2020 0445   RBC 2.34 (L) 04/02/2020 0445   HGB 7.4 (L) 04/02/2020 0445   HGB 11.2 (L) 03/14/2020 0858   HCT 22.6 (L) 04/02/2020 0445   PLT 72 (L) 04/02/2020 0445   PLT 67 (L) 03/14/2020 0858   MCV 96.6 04/02/2020 0445   MCH 31.6 04/02/2020 0445   MCHC 32.7 04/02/2020 0445   RDW 17.2 (H) 04/02/2020 0445   LYMPHSABS 0.4 (L) 03/19/2020 1118   MONOABS 0.3 03/19/2020 1118   EOSABS 0.0 03/19/2020 1118   BASOSABS 0.0 03/19/2020 1118      Assessment/Plan: 1. AKI/CKD stage 3b- initial insult occurred from 03/14/20 when Scr was 1.24 to her admission on 03/18/20 with Scr of 3.31. DDx was volume depletion from colitis vs ATN, covid infection, myeloma kidney, TMA due to chemo, or possibly cardiorenal with new onset cardiomyopathy (EF 25-30% presumably due to cardiotoxicity of chemo). She has had good UOP but remains volume overloaded. Had low UNa initially with blood and protein on UA. Renal US  without obstruction.  - BUN/Cr stable after 2 days of IV albumin + lasix >> will avoid aggressive diuresis given borderline renal function. Would switch to low dose maint po lasix 40 bid. - pt is a poor candidate for dialysis given her progressive myeloma and cardiomyopathy as well as episodes of arrhythmia and hypotension - no other suggestions at this time, will follow from a distance. Call w/ any questions.  2. IgA multiple myeloma - progressive despite ongoing chemo. Per oncology.  3. COVID infection - felt to be incidental as she is without respiratory symptoms. Possibly can come out of respiratory isolation per primary 4. Thrombocytopenia- plt count 8 on admit, now up to 72 5. Acute combined systolic and diastolic CHF- mainly right sided failure at this time. Felt to be due to cardiotoxicity of carflilzomib. At admit weight, has mild-mod LE edema, no pulm edema by exam.  On room air.  6. Disposition - very poor overall prognosis. Palliative care was consulted to help set goals/limits of care.   Kelly Splinter, MD 04/02/2020, 4:15 PM

## 2020-04-02 NOTE — Progress Notes (Signed)
PROGRESS NOTE  Debbie Chan HWE:[id number] DOB: [birth year] DOA: 03/18/2020 PCP: [name], MD  HPI/Recap of past 24 hours: Debbie Chan is a 70 year old female with past medical history significant for multiple myeloma, atrial flutter, chronic anemia who presented to the hospital with complaints of weakness.  She was found to have severe thrombocytopenia and AKI.  She was given IV fluid, platelet transfusions.  She was also incidentally found to have COVID-19 viral infection. Hospitalization complicated by acute renal failure as well as acute heart failure. Nephrology and cardiology have been following.   Volume overload on exam.  Nephrology consulted to assist with diuresing in the setting of AKI on CKD 3B.  Trial of IV albumin followed by IV Lasix 40 mg x 2 doses per nephrology.   Mild uptrend of creatinine 5.27 from 5.15.  04/02/20: No new complaints this morning.  Flat affect.  Creatinine is downtrending.  Assessment/Plan: Active Problems:   Anemia   Acute renal failure superimposed on stage 3b chronic kidney disease (HCC)   Atrial flutter (HCC)   Multiple myeloma without remission (HCC)   Thrombocytopenia (HCC)   Colitis   Hematuria   Polycystic kidney   Hyponatremia   Dehydration  Nonoliguric AKI on CKD stage IIIb likely multifactorial secondary to dehydration, myeloma kidney, heart failure. 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 Nephrology reconsulted on 03/30/2020 (signed off on 03/25/20) to assist with diuresing in the setting of AKI on CKD 3B. Creatinine is downtrending 5.01 from 5.27  Continue to avoid nephrotoxins, dehydration and hypotension. 1400 cc urine output recorded in the last 24-hour Continue daily renal panel.  Acute hypovolemic hyponatremia in the setting of acute renal failure. Drop in serum sodium  130 Nephrology assisting with management.  Acute systolic heart failure -Elevated BNP greater than 1800, bilateral lower extremity edema. -Suspect cardiomyopathy could be secondary to cardiotoxic effects from carfilzomib  Currently on Norvasc 2.5 mg daily, Coreg 12.5 mg twice daily, p.o. hydralazine 100 mg 3 times daily, Imdur 30 mg twice daily. Received a trial of IV albumin followed by IV Lasix 40 mg x 1 per nephrology on 03/30/2020 with positive results, repeated on 03/31/20 with lower urine output.  Continue strict I's and daily weight Net I&O -3.3 L  Bilateral lower extremity edema Improving with diuresis Continue to elevate extremities  COVID-19 viral infection -Incidental finding, tested positive 03/19/20.   Oxygen saturation 100% on room air.  Improving thrombocytopenia -Presented with platelet 8000 on admission.  Transfused 1 unit platelets -Hematology/oncology following.  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  Transfuse for platelets <20,000 for acute bleeding -Platelet count 72K from 65K from 59K from 42K  Treated acute colitis -GI PCR negative -CT abdomen pelvis reveals colitis of the distal descending and sigmoid colon -Completed antibiotic therapy  Multiple myeloma -Follows with [name]-Completed course of Decadron  Paroxysmal atrial flutter -CHA2DS2-VASc 4 -Anticoagulation currently on hold due to thrombocytopenia  Hypertension BP is currently at goal on regimen stated above. Continue to monitor vital signs.  Polycystic morphology kidneys -Follow-up outpatient    DVT prophylaxis:  SCDs Start: 03/19/20 1051  Code Status: Full code Family Communication:  Updated her son via telephone 03/30/2020.  Consults: Cardiology Nephrology.   Status is: Inpatient    Dispo: The patient is from: Home.  Anticipated d/c is to: Home.              Anticipated d/c date is: 04/04/2020.               Patient currently not stable for discharge, ongoing diuresing.   Difficult to place patient N/A        Objective: Vitals:   04/01/20 2141 04/02/20 0602 04/02/20 0751 04/02/20 1338  BP: (!) 150/83 (!) 160/88 (!) 152/79 136/72  Pulse: 79 82 84 83  Resp: 19 20  18  Temp: (!) 97.4 F (36.3 C) 98.2 F (36.8 C)  98.2 F (36.8 C)  TempSrc: Oral Oral  Oral  SpO2: 100% 100%  98%  Weight:      Height:        Intake/Output Summary (Last 24 hours) at 04/02/2020 1439 Last data filed at 04/02/2020 0811 Gross per 24 hour  Intake 760 ml  Output 1200 ml  Net -440 ml   Filed Weights   03/30/20 0508 03/31/20 0500 04/01/20 0500  Weight: 90.4 kg 88.4 kg 88.5 kg    Exam:  . General: 69 y.o. year-old female pleasant well-developed well-nourished no acute distress.  She is alert and oriented x3.   . Cardiovascular: Regular rate and rhythm no rubs or gallops.   . Respiratory: Clear to auscultation no wheezes no rales. . Abdomen: Soft nontender normal bowel sounds present. . Musculoskeletal: Improving pitting edema in lower extremities bilaterally. . Skin: No ulcerative lesions noted. . Psychiatry: Flat affect.  Data Reviewed: CBC: Recent Labs  Lab 03/28/20 0015 03/29/20 0918 03/31/20 0430 04/01/20 0517 04/02/20 0445  WBC 2.8* 4.1 2.9* 3.1* 2.7*  HGB 8.1* 8.8* 7.6* 7.2* 7.4*  HCT 24.5* 26.4* 22.9* 21.8* 22.6*  MCV 96.8 96.4 97.0 96.0 96.6  PLT 42* 59* 65* 65* 72*   Basic Metabolic Panel: Recent Labs  Lab 03/29/20 0918 03/30/20 0438 03/31/20 0430 04/01/20 0517 04/02/20 0445  NA 134* 133* 130* 134* 130*  K 3.7 3.9 3.8 4.0 3.9  CL 98 99 98 99 93*  CO2 23 21* 22 23 22  GLUCOSE 143* 96 89 91 86  BUN 79* 77* 76* 78* 73*  CREATININE 5.29* 5.26* 5.15* 5.27* 5.01*  CALCIUM 8.4* 8.0* 7.9* 8.0* 8.2*  PHOS  --   --  5.2* 5.0* 5.2*   GFR: Estimated Creatinine Clearance: 12.1 mL/min (A) (by C-G formula based on SCr of 5.01 mg/dL (H)). Liver Function Tests: Recent Labs  Lab  03/31/20 0430 04/01/20 0517 04/02/20 0445  ALBUMIN 3.1* 3.0* 3.2*   No results for input(s): LIPASE, AMYLASE in the last 168 hours. No results for input(s): AMMONIA in the last 168 hours. Coagulation Profile: No results for input(s): INR, PROTIME in the last 168 hours. Cardiac Enzymes: No results for input(s): CKTOTAL, CKMB, CKMBINDEX, TROPONINI in the last 168 hours. BNP (last 3 results) No results for input(s): PROBNP in the last 8760 hours. HbA1C: No results for input(s): HGBA1C in the last 72 hours. CBG: No results for input(s): GLUCAP in the last 168 hours. Lipid Profile: No results for input(s): CHOL, HDL, LDLCALC, TRIG, CHOLHDL, LDLDIRECT in the last 72 hours. Thyroid Function Tests: No results for input(s): TSH, T4TOTAL, FREET4, T3FREE, THYROIDAB in the last 72 hours. Anemia Panel: Recent Labs    04/01/20 0630  VITAMINB12 533  FOLATE 6.4  FERRITIN 437*  TIBC 355  IRON 67   Urine analysis:    Component Value Date/Time   COLORURINE YELLOW 03/30/2020 1626   APPEARANCEUR   CLEAR 03/30/2020 1626   LABSPEC 1.009 03/30/2020 1626   PHURINE 6.0 03/30/2020 1626   GLUCOSEU NEGATIVE 03/30/2020 1626   HGBUR SMALL (A) 03/30/2020 1626   BILIRUBINUR NEGATIVE 03/30/2020 1626   KETONESUR NEGATIVE 03/30/2020 1626   PROTEINUR 100 (A) 03/30/2020 1626   NITRITE NEGATIVE 03/30/2020 1626   LEUKOCYTESUR NEGATIVE 03/30/2020 1626   Sepsis Labs: _0 (procalcitonin:4,lacticidven:4)  ) No results found for this or any previous visit (from the past 240 hour(s)).    Studies: No results found.  Scheduled Meds: . acyclovir  200 mg Oral BID  . amLODipine  2.5 mg Oral Daily  . carvedilol  12.5 mg Oral BID WC  . Chlorhexidine Gluconate Cloth  6 each Topical Daily  . ferrous sulfate  325 mg Oral Q breakfast  . hydrALAZINE  100 mg Oral Q8H  . isosorbide mononitrate  30 mg Oral BID  . loratadine  10 mg Oral Daily  . senna-docusate  1 tablet Oral Daily  . sodium chloride flush   10-40 mL Intracatheter Q12H    Continuous Infusions:   LOS: 15 days     Kayleen Memos, MD [facility] Pager [phone]  If 7PM-7AM, please contact night-coverage [email or web address] Password [facility] 04/02/2020, 2:39 PM

## 2020-04-03 LAB — RENAL FUNCTION PANEL
Albumin: 3.4 g/dL — ABNORMAL LOW (ref 3.5–5.0)
Anion gap: 14 (ref 5–15)
BUN: 75 mg/dL — ABNORMAL HIGH (ref 8–23)
CO2: 22 mmol/L (ref 22–32)
Calcium: 8.3 mg/dL — ABNORMAL LOW (ref 8.9–10.3)
Chloride: 95 mmol/L — ABNORMAL LOW (ref 98–111)
Creatinine, Ser: 4.76 mg/dL — ABNORMAL HIGH (ref 0.44–1.00)
GFR, Estimated: 9 mL/min — ABNORMAL LOW (ref >60)
Glucose, Bld: 88 mg/dL (ref 70–99)
Phosphorus: 5.2 mg/dL — ABNORMAL HIGH (ref 2.5–4.6)
Potassium: 4 mmol/L (ref 3.5–5.1)
Sodium: 131 mmol/L — ABNORMAL LOW (ref 135–145)

## 2020-04-03 LAB — CBC
HCT: 22.2 % — ABNORMAL LOW (ref 36.0–46.0)
Hemoglobin: 7.4 g/dL — ABNORMAL LOW (ref 12.0–15.0)
MCH: 32.2 pg (ref 26.0–34.0)
MCHC: 33.3 g/dL (ref 30.0–36.0)
MCV: 96.5 fL (ref 80.0–100.0)
Platelets: 74 K/uL — ABNORMAL LOW (ref 150–400)
RBC: 2.3 MIL/uL — ABNORMAL LOW (ref 3.87–5.11)
RDW: 17.2 % — ABNORMAL HIGH (ref 11.5–15.5)
WBC: 3.5 K/uL — ABNORMAL LOW (ref 4.0–10.5)
nRBC: 0 % (ref 0.0–0.2)

## 2020-04-03 MED ORDER — ISOSORBIDE MONONITRATE ER 30 MG PO TB24
30.0000 mg | ORAL_TABLET | Freq: Every day | ORAL | Status: DC
  Administered 2020-04-04: 30 mg via ORAL
  Filled 2020-04-03: qty 1

## 2020-04-03 NOTE — Progress Notes (Signed)
PROGRESS NOTE  Debbie Chan MVH:[id number] DOB: [birth year] DOA: 03/18/2020 PCP: [name], MD  HPI/Recap of past 24 hours: Debbie Chan is a 70 year old female with past medical history significant for multiple myeloma, atrial flutter not on anticoagulation due to thrombocytopenia, chronic normocytic anemia who presented to [facility] ED with complaints of generalized weakness.  She was found to have severe thrombocytopenia, AKI and incidentally positive COVID-19 screen test. She was given IV fluid and platelet transfusions.   Hospital course was complicated by acute heart failure and worsening renal function.  Seen by nephrology and cardiology.    Volume overload on exam.  Nephrology was consulted to assist with diuresing in the setting of severe nonoliguric AKI on CKD 3B.  Trial of IV albumin followed by IV Lasix 40 mg x 3 doses per nephrology.   Downtrending of creatinine 4.76 from 5.01 from 5.27.  04/03/20: Seen and examined at her bedside.  Still volume overloaded on exam but improved.  She is currently on p.o. Lasix 40 mg twice daily.  Monitor and repeat renal panel in the morning, will likely discharge in the morning and repeat BMP in 1 week and continue medications as recommended by cardiology.  Assessment/Plan: Active Problems:   Anemia   AKI (acute kidney injury) ([place])   Atrial flutter (HCC)   Multiple myeloma without remission (HCC)   Thrombocytopenia (HCC)   Colitis   Hematuria   Polycystic kidney   Hyponatremia   Dehydration   Acute combined systolic and diastolic heart failure (HCC)  Nonoliguric AKI on CKD stage IIIb likely multifactorial secondary to dehydration, myeloma kidney, heart failure. 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 Nephrology reconsulted on 03/30/2020 (signed off on 03/25/20) to assist with diuresing in the  setting of severe nonoliguric AKI on CKD 3B. Creatinine is downtrending 4.76 from 5.01 from 5.27  Continue to avoid nephrotoxins, dehydration and hypotension. 2300 cc urine output recorded in the last 24 hours. Repeat renal panel in the morning.  Acute hypovolemic hyponatremia in the setting of acute renal failure. Serum sodium improving 131 from 130.  Acute systolic CHF -Elevated BNP greater than 1800, bilateral lower extremity edema. -Suspect cardiomyopathy could be secondary to cardiotoxic effects from carfilzomib  Currently on Norvasc 2.5 mg daily, Coreg 12.5 mg twice daily, p.o. hydralazine 100 mg 3 times daily, Imdur 30 mg twice daily 40 mg twice daily and p.o. Lasix 40 mg twice daily. Received a trial of IV albumin followed by IV Lasix 40 mg x 3 days per nephrology, starting on 03/30/2020 with positive results Continue strict I's and daily weight Net I&O -5.7 L.  Bilateral lower extremity edema Improving with diuresis Continue to elevate extremities  COVID-19 viral infection -Incidental finding, tested positive 03/19/20.   Oxygen saturation 100% on room air.  Improving thrombocytopenia -Presented with platelet 8000 on admission.  Transfused 1 unit platelets -Hematology/oncology following.  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  Transfuse for platelets <20,000 for acute bleeding -Platelet count 74K from 72K from 65K from 59K from 42K  Treated acute colitis -GI PCR negative -CT abdomen pelvis reveals colitis of the distal descending and sigmoid colon -Completed antibiotic therapy  Multiple myeloma -Follows with [name]-Completed course of Decadron  Paroxysmal atrial flutter -CHA2DS2-VASc 4 -Anticoagulation currently on hold due to thrombocytopenia  Hypertension BP is currently at goal on regimen stated above. Continue  to monitor vital signs.  Polycystic morphology kidneys -Follow-up outpatient    DVT  prophylaxis:  SCDs Start: 03/19/20 1051  Code Status: Full code Family Communication:  Updated her son via telephone.  Consults: Cardiology Nephrology.   Status is: Inpatient    Dispo: The patient is from: Home.              Anticipated d/c is to: Home.              Anticipated d/c date is: 04/04/2020.              Patient currently monitoring renal function with current regimen.   Difficult to place patient N/A        Objective: Vitals:   04/02/20 2142 04/03/20 0500 04/03/20 0601 04/03/20 1553  BP: 140/90  (!) 157/88 128/70  Pulse: 79  91 80  Resp: '18  20 16  ' Temp: (!) 97.5 F (36.4 C)  98.4 F (36.9 C) 99.2 F (37.3 C)  TempSrc: Oral  Oral Oral  SpO2: 100%  99% 99%  Weight:  88.4 kg    Height:        Intake/Output Summary (Last 24 hours) at 04/03/2020 1816 Last data filed at 04/03/2020 1800 Gross per 24 hour  Intake 400 ml  Output 2350 ml  Net -1950 ml   Filed Weights   03/31/20 0500 04/01/20 0500 04/03/20 0500  Weight: 88.4 kg 88.5 kg 88.4 kg    Exam:  . General: 70 y.o. year-old female pleasant in no acute distress.  Alert and oriented x3. . Cardiovascular: Regular rate and rhythm no rubs or gallops. Marland Kitchen Respiratory: Clear to auscultation no wheezing no rales.   . Abdomen: Soft nontender normal bowel sounds present.   . Musculoskeletal: Improving bilateral pitting edema in lower extremities. . Skin: No ulcerative lesions noted.   Marland Kitchen Psychiatry: Mood is appropriate for condition and setting.  Data Reviewed: CBC: Recent Labs  Lab 03/29/20 0918 03/31/20 0430 04/01/20 0517 04/02/20 0445 04/03/20 0355  WBC 4.1 2.9* 3.1* 2.7* 3.5*  HGB 8.8* 7.6* 7.2* 7.4* 7.4*  HCT 26.4* 22.9* 21.8* 22.6* 22.2*  MCV 96.4 97.0 96.0 96.6 96.5  PLT 59* 65* 65* 72* 74*   Basic Metabolic Panel: Recent Labs  Lab 03/30/20 0438 03/31/20 0430 04/01/20 0517 04/02/20 0445 04/03/20 0355  NA 133* 130* 134* 130* 131*  K 3.9 3.8 4.0 3.9 4.0  CL 99 98 99 93* 95*  CO2  21* '22 23 22 22  ' GLUCOSE 96 89 91 86 88  BUN 77* 76* 78* 73* 75*  CREATININE 5.26* 5.15* 5.27* 5.01* 4.76*  CALCIUM 8.0* 7.9* 8.0* 8.2* 8.3*  PHOS  --  5.2* 5.0* 5.2* 5.2*   GFR: Estimated Creatinine Clearance: 12.7 mL/min (A) (by C-G formula based on SCr of 4.76 mg/dL (H)). Liver Function Tests: Recent Labs  Lab 03/31/20 0430 04/01/20 0517 04/02/20 0445 04/03/20 0355  ALBUMIN 3.1* 3.0* 3.2* 3.4*   No results for input(s): LIPASE, AMYLASE in the last 168 hours. No results for input(s): AMMONIA in the last 168 hours. Coagulation Profile: No results for input(s): INR, PROTIME in the last 168 hours. Cardiac Enzymes: No results for input(s): CKTOTAL, CKMB, CKMBINDEX, TROPONINI in the last 168 hours. BNP (last 3 results) No results for input(s): PROBNP in the last 8760 hours. HbA1C: No results for input(s): HGBA1C in the last 72 hours. CBG: No results for input(s): GLUCAP in the last 168 hours. Lipid Profile: No results for input(s): CHOL, HDL, LDLCALC,  TRIG, CHOLHDL, LDLDIRECT in the last 72 hours. Thyroid Function Tests: No results for input(s): TSH, T4TOTAL, FREET4, T3FREE, THYROIDAB in the last 72 hours. Anemia Panel: Recent Labs    04/01/20 0630  VITAMINB12 533  FOLATE 6.4  FERRITIN 437*  TIBC 355  IRON 67   Urine analysis:    Component Value Date/Time   COLORURINE YELLOW 03/30/2020 1626   APPEARANCEUR CLEAR 03/30/2020 1626   LABSPEC 1.009 03/30/2020 1626   PHURINE 6.0 03/30/2020 1626   GLUCOSEU NEGATIVE 03/30/2020 1626   HGBUR SMALL (A) 03/30/2020 1626   BILIRUBINUR NEGATIVE 03/30/2020 1626   KETONESUR NEGATIVE 03/30/2020 1626   PROTEINUR 100 (A) 03/30/2020 1626   NITRITE NEGATIVE 03/30/2020 1626   LEUKOCYTESUR NEGATIVE 03/30/2020 1626   Sepsis Labs: '@LABRCNTIP' (procalcitonin:4,lacticidven:4)  ) No results found for this or any previous visit (from the past 240 hour(s)).    Studies: No results found.  Scheduled Meds: . acyclovir  200 mg Oral BID   . amLODipine  2.5 mg Oral Daily  . carvedilol  12.5 mg Oral BID WC  . Chlorhexidine Gluconate Cloth  6 each Topical Daily  . ferrous sulfate  325 mg Oral Q breakfast  . furosemide  40 mg Oral BID  . hydrALAZINE  100 mg Oral Q8H  . [START ON 04/04/2020] isosorbide mononitrate  30 mg Oral Daily  . loratadine  10 mg Oral Daily  . senna-docusate  1 tablet Oral Daily  . sodium chloride flush  10-40 mL Intracatheter Q12H    Continuous Infusions:   LOS: 16 days     Kayleen Memos, MD [facility] Pager [phone]  If 7PM-7AM, please contact night-coverage [email or web address] Password [facility] 04/03/2020, 6:16 PM

## 2020-04-03 NOTE — Progress Notes (Incomplete)
Progress Note  Patient Name: Debbie Chan Date of Encounter: 04/03/2020  Primary Cardiologist: Glori Bickers, MD   Subjective   ***  Inpatient Medications    Scheduled Meds: . acyclovir  200 mg Oral BID  . amLODipine  2.5 mg Oral Daily  . carvedilol  12.5 mg Oral BID WC  . Chlorhexidine Gluconate Cloth  6 each Topical Daily  . ferrous sulfate  325 mg Oral Q breakfast  . furosemide  40 mg Oral BID  . hydrALAZINE  100 mg Oral Q8H  . isosorbide mononitrate  30 mg Oral BID  . loratadine  10 mg Oral Daily  . senna-docusate  1 tablet Oral Daily  . sodium chloride flush  10-40 mL Intracatheter Q12H   Continuous Infusions:  PRN Meds: acetaminophen **OR** acetaminophen, hydrALAZINE, HYDROcodone-acetaminophen, melatonin, sodium chloride, sodium chloride flush   Vital Signs    Vitals:   04/02/20 1846 04/02/20 2142 04/03/20 0500 04/03/20 0601  BP: 133/85 140/90  (!) 157/88  Pulse: 82 79  91  Resp:  18  20  Temp:  (!) 97.5 F (36.4 C)  98.4 F (36.9 C)  TempSrc:  Oral  Oral  SpO2:  100%  99%  Weight:   88.4 kg   Height:        Intake/Output Summary (Last 24 hours) at 04/03/2020 1028 Last data filed at 04/03/2020 0616 Gross per 24 hour  Intake 880 ml  Output 2000 ml  Net -1120 ml   Filed Weights   03/31/20 0500 04/01/20 0500 04/03/20 0500  Weight: 88.4 kg 88.5 kg 88.4 kg    Physical Exam   ****GEN: Well nourished, well developed, in no acute distress.  HEENT: Grossly normal.  Neck: Supple, no JVD, carotid bruits, or masses. Cardiac: RRR, no murmurs, rubs, or gallops. No clubbing, cyanosis, edema.  Radials/DP/PT 2+ and equal bilaterally.  Respiratory:  Respirations regular and unlabored, clear to auscultation bilaterally. GI: Soft, nontender, nondistended, BS + x 4. MS: no deformity or atrophy. Skin: warm and dry, no rash. Neuro:  Strength and sensation are intact. Psych: AAOx3.  Normal affect.  Labs    Chemistry Recent Labs  Lab 04/01/20 0517  04/02/20 0445 04/03/20 0355  NA 134* 130* 131*  K 4.0 3.9 4.0  CL 99 93* 95*  CO2 '23 22 22  ' GLUCOSE 91 86 88  BUN 78* 73* 75*  CREATININE 5.27* 5.01* 4.76*  CALCIUM 8.0* 8.2* 8.3*  ALBUMIN 3.0* 3.2* 3.4*  GFRNONAA 8* 9* 9*  ANIONGAP '12 15 14     ' Hematology Recent Labs  Lab 04/01/20 0517 04/02/20 0445 04/03/20 0355  WBC 3.1* 2.7* 3.5*  RBC 2.27* 2.34* 2.30*  HGB 7.2* 7.4* 7.4*  HCT 21.8* 22.6* 22.2*  MCV 96.0 96.6 96.5  MCH 31.7 31.6 32.2  MCHC 33.0 32.7 33.3  RDW 17.2* 17.2* 17.2*  PLT 65* 72* 74*    Cardiac EnzymesNo results for input(s): TROPONINI in the last 168 hours. No results for input(s): TROPIPOC in the last 168 hours.   BNP Recent Labs  Lab 03/29/20 1316  BNP 622.2*     DDimer No results for input(s): DDIMER in the last 168 hours.   Radiology    No results found.  Telemetry    *** - Personally Reviewed  ECG    No new tracing as of 04/03/20- Personally Reviewed  Cardiac Studies   03/19/20 echo  1. Left ventricular ejection fraction, by estimation, is 25 to 30%. The  left ventricle  has severely decreased function. The left ventricle  demonstrates global hypokinesis although the septal LV segments appear  slightly worse. Left ventricular diastolic  parameters are consistent with Grade II diastolic dysfunction  (pseudonormalization).  2. Right ventricular systolic function is mildly reduced. The right  ventricular size is moderately enlarged. There is moderately elevated  pulmonary artery systolic pressure.  3. The mitral valve is grossly normal. Mild mitral valve regurgitation.  4. Right atrial size was massively dilated.  5. Tricuspid valve regurgitation is moderate to severe.  6. The aortic valve is tricuspid. There is mild calcification of the  aortic valve. There is mild thickening of the aortic valve. Aortic valve  regurgitation is mild.  7. Aortic dilatation noted. There is mild dilatation of the ascending  aorta, measuring  36 mm.  8. The inferior vena cava is normal in size with greater than 50%  respiratory variability, suggesting right atrial pressure of 3 mmHg.   Patient Profile     70 y.o. female multiple myeloma not in remission, status post 11 cycles of carfilzomib and cyclophosphamide, history of paroxysmal atrial flutter 2019, history of severe tricuspid regurgitation and right heart enlargement, admitted for suspected infectious colitis, acute renal insufficiency and severe thrombocytopenia and found to have severely depressed left ventricular systolic function, new compared to 2019. Assessment & Plan    1.  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  Plan is for potential outpatient Lexiscan Myoview given renal dysfunction at this time.  Per chart review, patient had evidence of chronic right heart failure with dilated RA on prior echocardiogram and now with evidence of severe TR on most recent echocardiogram. -Continue Lasix 40 mg twice daily -Net -4.6 L -Weight, 194lb   2.  HTN: -Stable, 157/88, 140/90, 133/85 -Continue carvedilol, hydralazine and Imdur -Amlodipine 2.5 was initiated due to lack of options in the setting of renal dysfunction  3.  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  IgA multiple myeloma: -Currently undergoing chemotherapy felt to cause cardiotoxicity as above -Management per primary team, oncology  6.  Acute on chronic kidney disease stage III: -There is concern for renal  toxicity secondary to chemotherapy -Nephrology following -Creatinine today, 4.76  7.  Thrombocytopenia: -Platelet count on admission, 8 -Platelet count today, [address]  Signed, Kathyrn Drown NP-[facility] Pager: [phone] 04/03/2020, 10:28 AM     For questions or updates, please contact   Please consult [email or web address] for contact info under Cardiology/STEMI.

## 2020-04-03 NOTE — Progress Notes (Signed)
Renal function improving (Cr 5.3 >5.1>4.8).  Switched to PO diuretics per nephrology.  Telemetry reviewed, she is maintaining sinus rhythm.    [facility] will sign off.   Medication Recommendations: Continue current meds: amlodipine 2.5 mg daily, carvedilol 12.5 mg twice daily, Lasix 40 mg twice daily, hydralazine 100 mg 3 times daily, Imdur 30 mg daily Other recommendations (labs, testing, etc): BMET within 1 week Follow up as an outpatient:  Will schedule  Donato Heinz, MD

## 2020-04-03 NOTE — Progress Notes (Signed)
Daily Progress Note   Patient Name: Debbie Chan       Date: 04/03/2020 DOB: [birth year]  Age: 70 y.o. MRN#: [id number] Attending Physician: Kayleen Memos, DO Primary Care Physician: [name], MD Admit Date: 03/18/2020  Reason for Consultation/Follow-up: Establishing goals of care  Subjective: Palliative care asked to check in again on Debbie Chan to continue discussion regarding goals of care.  I saw and examined her this evening.  She seemed irritable at time of my exam and declined interpreter or for me to call her son.  I reviewed clinical course since the last time that I saw her (1/28) and continued concern regarding comorbidities and interactions between her myeloma, treatment for myeloma, heart failure (possibly secondary to chemotherapy), and renal failure.  She was dismissive and reported, " I know."  She then expressed, "Nutrition.  I need more nutrition."  We discussed her concern that some food in the hospital is not to her liking and she prefers family to bring her food to supplement current diet.  I tried to point out the fact that her renal failure is not related to a nutrition issue but likely related to underlying issues related to her cancer.  She then expressed her plan to pray and focus on nutrition and, "we will see what happens."  Length of Stay: 16  Current Medications: Scheduled Meds:  . acyclovir  200 mg Oral BID  . amLODipine  2.5 mg Oral Daily  . carvedilol  12.5 mg Oral BID WC  . Chlorhexidine Gluconate Cloth  6 each Topical Daily  . ferrous sulfate  325 mg Oral Q breakfast  . furosemide  40 mg Oral BID  . hydrALAZINE  100 mg Oral Q8H  . [START ON 04/04/2020] isosorbide mononitrate  30 mg Oral Daily  . loratadine  10 mg Oral Daily  . senna-docusate   1 tablet Oral Daily  . sodium chloride flush  10-40 mL Intracatheter Q12H    Continuous Infusions:   PRN Meds: acetaminophen **OR** acetaminophen, hydrALAZINE, HYDROcodone-acetaminophen, melatonin, sodium chloride, sodium chloride flush  Physical Exam   General: Alert, awake, in no acute distress.  HEENT: No bruits, no goiter, no JVD Heart: Regular rate and rhythm. No murmur appreciated. Lungs: Good air movement, clear Abdomen: Soft, nontender, nondistended, positive bowel sounds.  Ext: + edema Skin:  Warm and dry Neuro: Grossly intact, nonfocal. Psych: Irritable, flat affect         Vital Signs: BP 128/70 (BP Location: Right Wrist)   Pulse 80   Temp 99.2 F (37.3 C) (Oral)   Resp 16   Ht '5\' 7"'  (1.702 m)   Wt 88.4 kg   SpO2 99%   BMI 30.53 kg/m  SpO2: SpO2: 99 % O2 Device: O2 Device: Room Air O2 Flow Rate:    Intake/output summary:   Intake/Output Summary (Last 24 hours) at 04/03/2020 1932 Last data filed at 04/03/2020 1800 Gross per 24 hour  Intake 400 ml  Output 2350 ml  Net -1950 ml   LBM: Last BM Date: 04/03/20 Baseline Weight: Weight: 87 kg Most recent weight: Weight: 88.4 kg       Palliative Assessment/Data:    Flowsheet Rows   Flowsheet Row Most Recent Value  Intake Tab   Referral Department Hospitalist  Unit at Time of Referral Med/Surg Unit  Palliative Care Primary Diagnosis Cancer  Date Notified 03/22/20  Palliative Care Type New Palliative care  Reason for referral Clarify Goals of Care  Date of Admission 03/18/20  Date first seen by Palliative Care 03/23/20  # of days Palliative referral response time 1 Day(s)  # of days IP prior to Palliative referral 4  Clinical Assessment   Palliative Performance Scale Score 60%  Psychosocial & Spiritual Assessment   Palliative Care Outcomes   Patient/Family meeting held? Yes  Who was at the meeting? Patient, son via phone      Patient Active Problem List   Diagnosis Date Noted  . Acute  combined systolic and diastolic heart failure ([place])   . Colitis 03/19/2020  . Hematuria 03/19/2020  . Polycystic kidney 03/19/2020  . Hyponatremia 03/19/2020  . Dehydration 03/19/2020  . Thrombocytopenia ([place]) 03/18/2020  . Cough 01/25/2020  . Hip pain 12/01/2019  . Port-A-Cath in place 06/09/2019  . Encounter for antineoplastic chemotherapy 10/02/2017  . Goals of care, counseling/discussion 10/02/2017  . Multiple myeloma without remission ([place])   . Hepatomegaly   . Atrial flutter ([place])   . Chest pain 09/19/2017  . Tachycardia 09/19/2017  . Lytic bone lesions on xray 09/19/2017  . Anemia 09/19/2017  . AKI (acute kidney injury) ([place]) 09/19/2017  . Pleural effusion on right 09/19/2017    Palliative Care Assessment & Plan   Patient Profile: 70 y.o. female  with past medical history of atrial flutter, multiple myeloma admitted on 03/18/2020 with weakness and decreased appetite.  She has a complicated overall picture with multiple system involvement, most of which likely stems from either underlying myeloma or treatment associated with it.  Palliative consulted for goals of care.  Recommendations/Plan:  Full code/full scope  Debbie Chan continues to be focused on hope that she will have healing through God's intervention.  She feels that she is not improving this admission due to "no nutrition."  She continues to desire aggressive care and is open to any offered interventions.  We will continue to follow, however, I do not think that her desire for aggressive interventions is likely to change in the near future based on my conversations with her.  Goals of Care and Additional Recommendations:  Limitations on Scope of Treatment: Full Scope Treatment  Code Status:    Code Status Orders  (From admission, onward)         Start     Ordered   03/19/20 1051  Full code  Continuous  03/19/20 1050        Code Status History    Date Active Date Inactive Code Status Order ID  Comments User Context   09/19/2017 1601 09/28/2017 2137 Full Code [id number]  [facility]   Advance Care Planning Activity       Prognosis:   Unable to determine  Discharge Planning:  To Be Determined  Care plan was discussed with patient, bedside RN  Thank you for allowing the Palliative Medicine Team to assist in the care of this patient.   Time In: 1800 Time Out: 1840 Total Time 40 Prolonged Time Billed No      Greater than 50%  of this time was spent counseling and coordinating care related to the above assessment and plan.  Micheline Rough, MD  Please contact Palliative Medicine Team phone at [phone] for questions and concerns.

## 2020-04-04 ENCOUNTER — Telehealth: Payer: Self-pay

## 2020-04-04 ENCOUNTER — Other Ambulatory Visit (HOSPITAL_COMMUNITY): Payer: Self-pay | Admitting: Family Medicine

## 2020-04-04 LAB — RENAL FUNCTION PANEL
Albumin: 3.4 g/dL — ABNORMAL LOW (ref 3.5–5.0)
Anion gap: 13 (ref 5–15)
BUN: 78 mg/dL — ABNORMAL HIGH (ref 8–23)
CO2: 23 mmol/L (ref 22–32)
Calcium: 8.3 mg/dL — ABNORMAL LOW (ref 8.9–10.3)
Chloride: 95 mmol/L — ABNORMAL LOW (ref 98–111)
Creatinine, Ser: 4.73 mg/dL — ABNORMAL HIGH (ref 0.44–1.00)
GFR, Estimated: 9 mL/min — ABNORMAL LOW (ref >60)
Glucose, Bld: 96 mg/dL (ref 70–99)
Phosphorus: 5.5 mg/dL — ABNORMAL HIGH (ref 2.5–4.6)
Potassium: 3.8 mmol/L (ref 3.5–5.1)
Sodium: 131 mmol/L — ABNORMAL LOW (ref 135–145)

## 2020-04-04 MED ORDER — ACETAMINOPHEN 325 MG PO TABS: 650.0000 mg | ORAL_TABLET | Freq: Four times a day (QID) | ORAL | Status: DC | PRN

## 2020-04-04 MED ORDER — FERROUS SULFATE 325 (65 FE) MG PO TABS: 325.0000 mg | ORAL_TABLET | Freq: Every day | ORAL | 3 refills | Status: DC

## 2020-04-04 MED ORDER — HYDRALAZINE HCL 100 MG PO TABS: 100.0000 mg | ORAL_TABLET | Freq: Three times a day (TID) | ORAL | 3 refills | Status: DC

## 2020-04-04 MED ORDER — AMLODIPINE BESYLATE 2.5 MG PO TABS: 2.5000 mg | ORAL_TABLET | Freq: Every day | ORAL | 3 refills | Status: DC

## 2020-04-04 MED ORDER — FUROSEMIDE 40 MG PO TABS: 40.0000 mg | ORAL_TABLET | Freq: Two times a day (BID) | ORAL | 3 refills | Status: DC

## 2020-04-04 MED ORDER — CARVEDILOL 12.5 MG PO TABS: 12.5000 mg | ORAL_TABLET | Freq: Two times a day (BID) | ORAL | 3 refills | Status: DC

## 2020-04-04 MED ORDER — ISOSORBIDE MONONITRATE ER 30 MG PO TB24: 30.0000 mg | ORAL_TABLET | Freq: Every day | ORAL | 3 refills | Status: DC

## 2020-04-04 MED ORDER — HEPARIN SOD (PORK) LOCK FLUSH 100 UNIT/ML IV SOLN
500.0000 [IU] | INTRAVENOUS | Status: AC | PRN
  Administered 2020-04-04: 500 [IU]
  Filled 2020-04-04: qty 5

## 2020-04-04 MED FILL — FERROUS SULFATE 325 MG TAB: 325 (65 FE) | 30 days supply | Qty: 30 | Fill #0

## 2020-04-04 MED FILL — ISOSORBIDE MN ER 30 MG TAB: 30 | 30 days supply | Qty: 30 | Fill #0

## 2020-04-04 MED FILL — FUROSEMIDE 40 MG TAB: 40 | 30 days supply | Qty: 60 | Fill #0

## 2020-04-04 MED FILL — CARVEDILOL 12.5 MG TABLET: 12.5 | 30 days supply | Qty: 60 | Fill #0

## 2020-04-04 MED FILL — hydrALAZINE HCL 100 MG TABS: 100 | 30 days supply | Qty: 90 | Fill #0

## 2020-04-04 MED FILL — AMLODIPINE BESYLATE 2.5 MG: 2.5 | 30 days supply | Qty: 30 | Fill #0

## 2020-04-04 NOTE — Discharge Summary (Signed)
Physician Discharge Summary  Debbie Chan KXF:[id number] DOB: 1950-11-06 DOA: 03/18/2020  PCP: [name], MD  Admit date: 03/18/2020 Discharge date: 04/04/2020  Time spent: 36 minutes  Recommendations for Outpatient Follow-up:  1. Requires Chem-12, CBC 1 week 2. Requires continued goals of care in the outpatient setting as not a dialysis candidate 3. Needs isolation from other family members for total of 10 days after diagnosed with COVID   Discharge Diagnoses:  Active Problems:   Anemia   AKI (acute kidney injury) ([place])   Atrial flutter (HCC)   Multiple myeloma without remission (HCC)   Thrombocytopenia (HCC)   Colitis   Hematuria   Polycystic kidney   Hyponatremia   Dehydration   Acute combined systolic and diastolic heart failure ([place])   Discharge Condition: Fair to guarded  Diet recommendation: Heart healthy  Filed Weights   04/01/20 0500 04/03/20 0500 04/04/20 0500  Weight: 88.5 kg 88.4 kg 88.4 kg    History of present illness:  70 year old African female prior IgA multiple myeloma diagnosed 08/2017 on carfilzomib and Cytoxan and Decadron-has had disease progression despite therapy A flutter not on anticoagulation due to TCP, normocytic anemia She was diagnosed coincidently with COVID but then found to have worsening systolic heart failure baseline EF 25 to 30% See below  Hospital Course:  Acute systolic heart failure Possibly secondary to cardiotoxicity from carfilzomib Cardiology consulted medications adjusted as per [facility] given IV albumin and Lasix and improved to some degree discharge weight 88 kg Nonoliguric AKI superimposed on CKD 3 ?  Myeloma kidney, renal ultrasound negative creatinine down trended to the 4 range not a candidate for dialysis Outpatient diuretics as needed Coincidental COVID infection Sats 100% on room air Thrombocytopenia on admission 8000 Transfuse 1 unit platelets given Decadron X 5 days and platelet counts now improved likely  worsened from acute viral illness Acute colitis Etiology unclear?  COVID Completed antibiotic clinically clear no added sound no rales no rhonchi Therapy this admission Paroxysmal A. fib not a candidate for anticoagulation Continue meds  Procedures:    Consultations:  Cardiology  Nephrology  Palliative  Discharge Exam: Vitals:   04/04/20 0620 04/04/20 1154  BP: (!) 148/75 112/60  Pulse: 89   Resp:  (!) 22  Temp: 99.2 F (37.3 C) 98.8 F (37.1 C)  SpO2: 100% 99%    General: Awake coherent no distress EOMI NCAT no focal deficit Cardiovascular: S1-S2 no murmur rub or gallop seems to be in rate controlled A. fib alternating with sinus Respiratory: Abdomen soft no rebound no guarding Neurologically intact  Discharge Instructions   Discharge Instructions    Diet - low sodium heart healthy   Complete by: As directed    Discharge instructions   Complete by: As directed    This hospitalization you were diagnosed with heart failure and kidney failure which is improving slightly and you will need close follow-up in the outpatient setting-multiple medications have changed and I would recommend you look carefully at your medication list as certain medications have changed you You need to follow-up with your oncologist in the outpatient setting You will need probably a total of 10 days isolation from the time of diagnosis COVID from family members and will need to wear a mask around them please get labs in the outpatient setting in about a week   Increase activity slowly   Complete by: As directed      Allergies as of 04/04/2020   No Known Allergies     Medication List  STOP taking these medications   dexamethasone 4 MG tablet Commonly known as: DECADRON   diphenhydrAMINE 25 MG tablet Commonly known as: Benadryl Allergy   lidocaine-prilocaine cream Commonly known as: EMLA   metoprolol tartrate 25 MG tablet Commonly known as: LOPRESSOR   oxyCODONE-acetaminophen  5-325 MG tablet Commonly known as: Percocet   prochlorperazine 10 MG tablet Commonly known as: COMPAZINE   RELION GLUCOSE TEST STRIPS test strip Generic drug: glucose blood     TAKE these medications   acetaminophen 325 MG tablet Commonly known as: TYLENOL Take 2 tablets (650 mg total) by mouth every 6 (six) hours as needed for mild pain (or Fever >/= 101).   acyclovir 200 MG capsule Commonly known as: ZOVIRAX Take 1 capsule (200 mg total) by mouth 2 (two) times daily.   amLODipine 2.5 MG tablet Commonly known as: NORVASC Take 1 tablet (2.5 mg total) by mouth daily. Start taking on: April 05, 2020   blood glucose meter kit and supplies Dispense based on patient and insurance preference. Use up to four times daily as directed. (FOR ICD-10 E10.9, E11.9).   carvedilol 12.5 MG tablet Commonly known as: COREG Take 1 tablet (12.5 mg total) by mouth 2 (two) times daily with a meal.   Claritin 10 MG tablet Generic drug: loratadine Take 10 mg by mouth daily.   ferrous sulfate 325 (65 FE) MG tablet Take 1 tablet (325 mg total) by mouth daily with breakfast. Start taking on: April 05, 2020   furosemide 40 MG tablet Commonly known as: LASIX Take 1 tablet (40 mg total) by mouth 2 (two) times daily. What changed:   when to take this  reasons to take this   hydrALAZINE 100 MG tablet Commonly known as: APRESOLINE Take 1 tablet (100 mg total) by mouth every 8 (eight) hours.   isosorbide mononitrate 30 MG 24 hr tablet Commonly known as: IMDUR Take 1 tablet (30 mg total) by mouth daily. Start taking on: April 05, 2020   MELATONIN PO Take 10 mg by mouth at bedtime as needed (sleep).   omeprazole 20 MG capsule Commonly known as: PRILOSEC Take 1 capsule (20 mg total) by mouth daily.   ReliOn Ultra Thin Lancets 30G Misc USE 1 TO CHECK GLUCOSE ONCE DAILY FOR GLUCOSE TESTING      No Known Allergies  Follow-up Information    Deberah Pelton, NP Follow up on  04/17/2020.   Specialty: Cardiology Why: 2:45pm  Contact information: [address] [place] [address] [phone]                The results of significant diagnostics from this hospitalization (including imaging, microbiology, ancillary and laboratory) are listed below for reference.    Significant Diagnostic Studies: CT ABDOMEN PELVIS WO CONTRAST  Result Date: 03/18/2020 CLINICAL DATA:  70 year old female with left lower quadrant abdominal pain. Concern for diverticulitis. EXAM: CT ABDOMEN AND PELVIS WITHOUT CONTRAST TECHNIQUE: Multidetector CT imaging of the abdomen and pelvis was performed following the standard protocol without IV contrast. COMPARISON:  None. FINDINGS: Evaluation of this exam is limited in the absence of intravenous contrast. Lower chest: Partially visualized small bilateral pleural effusions with bibasilar subsegmental atelectasis. There is cardiomegaly with dilatation of the right atrium. No intra-abdominal free air. Small ascites. Hepatobiliary: Multiple hepatic cysts and several additional subcentimeter hypodensities which are too small to characterize. No intrahepatic biliary dilatation. The gallbladder is unremarkable. Pancreas: Unremarkable. No pancreatic ductal dilatation or surrounding inflammatory changes. Spleen: Normal in size  without focal abnormality. Adrenals/Urinary Tract: Polycystic morphology of the kidneys with replacement of the majority of the renal parenchyma by cysts. Evaluation of the renal cyst is limited on this noncontrast CT. There is no hydronephrosis or nephrolithiasis on either side. The urinary bladder is grossly unremarkable. Stomach/Bowel: There is sigmoid diverticulosis. There is diffuse inflammatory changes and thickening of the distal descending and sigmoid colon consistent with colitis. Correlation with clinical exam and stool cultures recommended. No bowel obstruction. The appendix is normal. Vascular/Lymphatic:  Moderate aortoiliac atherosclerotic disease. The IVC is unremarkable. No portal venous gas. There is no adenopathy. Reproductive: The uterus and ovaries are grossly unremarkable. Other: Mild subcutaneous edema. No fluid collection. Musculoskeletal: Osteopenia. No acute osseous pathology. IMPRESSION: 1. Colitis of the distal descending and sigmoid colon. Correlation with clinical exam and stool cultures recommended. No bowel obstruction. Normal appendix. 2. Sigmoid diverticulosis. 3. Polycystic morphology of the kidneys. 4. Small bilateral pleural effusions with bibasilar subsegmental atelectasis. 5. Aortic Atherosclerosis (ICD10-I70.0). Electronically Signed   By: Anner Crete M.D.   On: 03/18/2020 21:33   US RENAL  Result Date: 03/20/2020 CLINICAL DATA:  Chronic kidney disease, acute renal failure EXAM: RENAL / URINARY TRACT ULTRASOUND COMPLETE COMPARISON:  None. FINDINGS: Right Kidney: Renal measurements: 12.9 x 5.8 x 6.5 cm = volume: 257.6 mL. Echogenicity is likely increased. Numerous cysts are present. No hydronephrosis visualized. Left Kidney: Renal measurements: 17.2 x 7.8 x 8.2 cm = volume: 576.5 mL. Echogenicity is likely increased. Numerous cysts are present. No hydronephrosis visualized. Bladder: Not visualized due to recent voiding. Other: None. IMPRESSION: No hydronephrosis. Numerous bilateral renal cysts. Increased echogenicity likely reflecting chronic medical renal disease. Electronically Signed   By: Macy Mis M.D.   On: 03/20/2020 11:05   DG CHEST PORT 1 VIEW  Result Date: 03/29/2020 CLINICAL DATA:  Shortness of breath. EXAM: PORTABLE CHEST 1 VIEW COMPARISON:  March 21, 2020. FINDINGS: Stable cardiomegaly. Right internal jugular Port-A-Cath is unchanged in position. No pneumothorax is noted. Increased bibasilar opacities are noted concerning for worsening atelectasis or infiltrates with associated effusions. Bony thorax is unremarkable. IMPRESSION: Increased bibasilar opacities  are noted concerning for worsening atelectasis or infiltrates with associated effusions. Electronically Signed   By: Marijo Conception M.D.   On: 03/29/2020 15:20   DG Chest Port 1 View  Result Date: 03/21/2020 CLINICAL DATA:  Cough.  COVID-19 positive EXAM: PORTABLE CHEST 1 VIEW COMPARISON:  March 19, 2020 FINDINGS: Minimal left pleural effusion. No edema or airspace opacity. There is cardiomegaly with pulmonary vascularity normal. No adenopathy. Port-A-Cath tip is in the superior vena cava near the cavoatrial junction. Bones osteoporotic. IMPRESSION: Minimal left pleural effusion. No edema or airspace opacity. Stable cardiomegaly. Osteoporosis. Stable Port-A-Cath position. Electronically Signed   By: Lowella Grip III M.D.   On: 03/21/2020 13:30   DG CHEST PORT 1 VIEW  Result Date: 03/19/2020 CLINICAL DATA:  70 year old female with weakness. EXAM: PORTABLE CHEST 1 VIEW COMPARISON:  Chest radiograph dated 03/19/2020. FINDINGS: Right sided Port-A-Cath in similar position. There is shallow inspiration. Probable small left and trace right pleural effusions. Minimal left lung base atelectasis. Pneumonia is not excluded. No pneumothorax. No acute osseous pathology. Osteopenia. IMPRESSION: Shallow inspiration with probable small left and trace right pleural effusions. No significant interval change since the prior radiograph. Electronically Signed   By: Anner Crete M.D.   On: 03/19/2020 16:58   DG CHEST PORT 1 VIEW  Result Date: 03/19/2020 CLINICAL DATA:  Multiple myeloma, weakness EXAM: PORTABLE CHEST 1  VIEW COMPARISON:  01/25/2020 FINDINGS: The lungs are symmetrically well expanded. Tiny left pleural effusion is suspected. No pneumothorax. Right internal jugular chest port tip is seen within the superior vena cava, unchanged. Mild cardiomegaly is likely stable when accounting for changes in radiographic technique. Pulmonary vascularity is normal. No acute bone abnormality. IMPRESSION: Stable  cardiomegaly.  Tiny left pleural effusion. Electronically Signed   By: Fidela Salisbury MD   On: 03/19/2020 00:55   ECHOCARDIOGRAM COMPLETE  Result Date: 03/19/2020    ECHOCARDIOGRAM REPORT   Patient Name:   Debbie Chan Date of Exam: 03/19/2020 Medical Rec #:  [id number]    Height:       68.0 in Accession #:    [id number]   Weight:       187.0 lb Date of Birth:  September 04, 1950    BSA:          1.986 m Patient Age:    68 years     BP:           166/87 mmHg Patient Gender: F            HR:           68 bpm. Exam Location:  Inpatient Procedure: 2D Echo, Cardiac Doppler and Color Doppler Indications:    I51.7 Cardiomegaly  History:        Patient has prior history of Echocardiogram examinations, most                 recent 09/21/2017. Cancer.  Sonographer:    Jonelle Sidle Dance Referring Phys: [id number] [place]  1. Left ventricular ejection fraction, by estimation, is 25 to 30%. The left ventricle has severely decreased function. The left ventricle demonstrates global hypokinesis although the septal LV segments appear slightly worse. Left ventricular diastolic parameters are consistent with Grade II diastolic dysfunction (pseudonormalization).  2. Right ventricular systolic function is mildly reduced. The right ventricular size is moderately enlarged. There is moderately elevated pulmonary artery systolic pressure.  3. The mitral valve is grossly normal. Mild mitral valve regurgitation.  4. Right atrial size was massively dilated.  5. Tricuspid valve regurgitation is moderate to severe.  6. The aortic valve is tricuspid. There is mild calcification of the aortic valve. There is mild thickening of the aortic valve. Aortic valve regurgitation is mild.  7. Aortic dilatation noted. There is mild dilatation of the ascending aorta, measuring 36 mm.  8. The inferior vena cava is normal in size with greater than 50% respiratory variability, suggesting right atrial pressure of 3 mmHg. Comparison(s): Compared to prior  study on 75/1025, the LV systolic function is now severely reduced. FINDINGS  Left Ventricle: Left ventricular ejection fraction, by estimation, is 25 to 30%. The left ventricle has severely decreased function. The left ventricle demonstrates global hypokinesis although the septal segments appear slightly worse. The left ventricular internal cavity size was normal in size. There is no left ventricular hypertrophy. Left ventricular diastolic parameters are consistent with Grade II diastolic dysfunction (pseudonormalization). Right Ventricle: The right ventricular size is moderately enlarged. No increase in right ventricular wall thickness. Right ventricular systolic function is mildly reduced. There is moderately elevated pulmonary artery systolic pressure. The tricuspid regurgitant velocity is 3.47 m/s, and with an assumed right atrial pressure of 3 mmHg, the estimated right ventricular systolic pressure is 85.2 mmHg. Left Atrium: Left atrial size was normal in size. Right Atrium: Right atrial size was massively dilated. Pericardium: There is no evidence of pericardial effusion. Mitral Valve:  The mitral valve is grossly normal. There is mild thickening of the mitral valve leaflet(s). Mild mitral valve regurgitation. Tricuspid Valve: The tricuspid valve is normal in structure. Tricuspid valve regurgitation is moderate to severe. Aortic Valve: The aortic valve is tricuspid. There is mild calcification of the aortic valve. There is mild thickening of the aortic valve. Aortic valve regurgitation is mild. Aortic regurgitation PHT measures 643 msec. Pulmonic Valve: The pulmonic valve was normal in structure. Pulmonic valve regurgitation is trivial. Aorta: Aortic dilatation noted. There is mild dilatation of the ascending aorta, measuring 36 mm. Venous: The inferior vena cava is normal in size with greater than 50% respiratory variability, suggesting right atrial pressure of 3 mmHg. IAS/Shunts: There is left bowing of the  interatrial septum, suggestive of elevated right atrial pressure. No atrial level shunt detected by color flow Doppler.  LEFT VENTRICLE PLAX 2D LVIDd:         5.10 cm  Diastology LVIDs:         3.70 cm  LV e' medial:    5.77 cm/s LV PW:         1.40 cm  LV E/e' medial:  11.5 LV IVS:        0.90 cm  LV e' lateral:   8.59 cm/s LVOT diam:     2.00 cm  LV E/e' lateral: 7.7 LV SV:         46 LV SV Index:   23 LVOT Area:     3.14 cm  RIGHT VENTRICLE             IVC RV Basal diam:  4.30 cm     IVC diam: 2.20 cm RV Mid diam:    3.30 cm RV S prime:     11.70 cm/s TAPSE (M-mode): 2.2 cm LEFT ATRIUM             Index       RIGHT ATRIUM           Index LA diam:        4.20 cm 2.11 cm/m  RA Area:     48.30 cm LA Vol (A2C):   70.9 ml 35.70 ml/m RA Volume:   222.00 ml 111.77 ml/m LA Vol (A4C):   39.6 ml 19.94 ml/m LA Biplane Vol: 55.1 ml 27.74 ml/m  AORTIC VALVE LVOT Vmax:   73.70 cm/s LVOT Vmean:  51.150 cm/s LVOT VTI:    0.145 m AI PHT:      643 msec  AORTA Ao Root diam: 3.10 cm Ao Asc diam:  3.60 cm MITRAL VALVE               TRICUSPID VALVE MV Area (PHT): 2.01 cm    TR Peak grad:   48.2 mmHg MV Decel Time: 377 msec    TR Vmax:        347.00 cm/s MV E velocity: 66.30 cm/s MV A velocity: 52.10 cm/s  SHUNTS MV E/A ratio:  1.27        Systemic VTI:  0.14 m                            Systemic Diam: 2.00 cm Gwyndolyn Kaufman MD Electronically signed by Gwyndolyn Kaufman MD Signature Date/Time: 03/19/2020/3:14:30 PM    Final     Microbiology: No results found for this or any previous visit (from the past 240 hour(s)).   Labs: Basic Metabolic Panel: Recent Labs  Lab 03/31/20  0430 04/01/20 0517 04/02/20 0445 04/03/20 0355 04/04/20 0333  NA 130* 134* 130* 131* 131*  K 3.8 4.0 3.9 4.0 3.8  CL 98 99 93* 95* 95*  CO2 _0 GLUCOSE 89 91 86 88 96  BUN 76* 78* 73* 75* 78*  CREATININE 5.15* 5.27* 5.01* 4.76* 4.73*  CALCIUM 7.9* 8.0* 8.2* 8.3* 8.3*  PHOS 5.2* 5.0* 5.2* 5.2* 5.5*   Liver Function  Tests: Recent Labs  Lab 03/31/20 0430 04/01/20 0517 04/02/20 0445 04/03/20 0355 04/04/20 0333  ALBUMIN 3.1* 3.0* 3.2* 3.4* 3.4*   No results for input(s): LIPASE, AMYLASE in the last 168 hours. No results for input(s): AMMONIA in the last 168 hours. CBC: Recent Labs  Lab 03/29/20 0918 03/31/20 0430 04/01/20 0517 04/02/20 0445 04/03/20 0355  WBC 4.1 2.9* 3.1* 2.7* 3.5*  HGB 8.8* 7.6* 7.2* 7.4* 7.4*  HCT 26.4* 22.9* 21.8* 22.6* 22.2*  MCV 96.4 97.0 96.0 96.6 96.5  PLT 59* 65* 65* 72* 74*   Cardiac Enzymes: No results for input(s): CKTOTAL, CKMB, CKMBINDEX, TROPONINI in the last 168 hours. BNP: BNP (last 3 results) Recent Labs    03/19/20 1715 03/21/20 1235 03/29/20 1316  BNP 1,809.1* 594.1* 622.2*    ProBNP (last 3 results) No results for input(s): PROBNP in the last 8760 hours.  CBG: No results for input(s): GLUCAP in the last 168 hours.     Signed:  Nita Sells MD   [facility] 04/04/2020, 1:18 PM

## 2020-04-04 NOTE — Plan of Care (Signed)
  Problem: Education: Goal: Knowledge of risk factors and measures for prevention of condition will improve Outcome: Progressing   Problem: Coping: Goal: Psychosocial and spiritual needs will be supported Outcome: Progressing   Problem: Respiratory: Goal: Will maintain a patent airway Outcome: Progressing Goal: Complications related to the disease process, condition or treatment will be avoided or minimized Outcome: Progressing   

## 2020-04-04 NOTE — Telephone Encounter (Signed)
Pts son, Alta Corning called stating pt will not make her appts tomorrow 04/04/20 because she is still in the hospital.  I have cancelled pts 04/05/20 OV/Flush/lab/infusion appt as well as her 04/06/20 infusion appt.

## 2020-04-04 NOTE — Progress Notes (Signed)
..  The following medication: Kyprolis was re-approved for Replacement from [employer]. Approved 05/13/2020 to 05/13/2021. No lapse in coverage, continued care.  Original Expiration of Assistance: 05/12/2020. Reason: Self Pay  ID: [id number] Next DOS: 04/05/2020.

## 2020-04-05 ENCOUNTER — Other Ambulatory Visit: Payer: Self-pay

## 2020-04-05 ENCOUNTER — Inpatient Hospital Stay: Payer: Self-pay

## 2020-04-05 ENCOUNTER — Ambulatory Visit: Payer: Self-pay

## 2020-04-05 ENCOUNTER — Telehealth: Payer: Self-pay | Admitting: Medical Oncology

## 2020-04-05 NOTE — Telephone Encounter (Signed)
Son wants to know when pt needs to come in to resume her treatment.  Schedule message sent for appt next week.

## 2020-04-06 ENCOUNTER — Inpatient Hospital Stay: Payer: Self-pay

## 2020-04-06 ENCOUNTER — Ambulatory Visit: Payer: Self-pay

## 2020-04-09 ENCOUNTER — Telehealth: Payer: Self-pay | Admitting: Internal Medicine

## 2020-04-09 NOTE — Telephone Encounter (Signed)
Scheduled appt per 2/10 sch msg - unable to reach pt - left message for patient with appt date and time

## 2020-04-11 ENCOUNTER — Inpatient Hospital Stay: Payer: Self-pay

## 2020-04-11 ENCOUNTER — Inpatient Hospital Stay: Payer: Self-pay | Admitting: Physician Assistant

## 2020-04-11 NOTE — Progress Notes (Deleted)
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  PRIOR THERAPY: 1) Systemic therapy with Velcade 1.3 mg/M2 weekly, Revlimid 25 mg p.o. daily for 14 days every 3 weeks in addition to weekly Decadron 40 mg orally. First dose of treatment 10/08/2017.  Treatment was placed on hold due to development of a significant rash.  She resumed treatment with only dexamethasone and Velcade on 11/19/2017. This was discontinued on 04/14/2019 due to evidence of disease progression.  2) Pomalyst 4 mg p.o. for 21 days every 4 weeks and 40 mg p.o. Decadron once a week. First dose 04/29/2019-05/04/2019. Status post 6 days of treatment. Discontinued secondary to allergy to Pomalyst.   CURRENT THERAPY: Chemotherapy with carfilzomib on days 1, 2, 8, 9, 15, and 16 and Cytoxan 300 mg/m2 on days 1, 8, and 15 IV every 4 weeks and 40 mg p.o. weekly of Decadron. First dose expected on 05/12/2019. Status post 10 cycles.  INTERVAL HISTORY: Debbie Chan 70 y.o. female returns returns to the clinic today for follow-up visit.  In the interval since her last appointment, the patient was hospitalized from 03/18/2020 -04/04/2020 after presenting to the emergency room with weakness.  Labs showed acute kidney injury, hyponatremia, thrombocytopenia, and the patient was found to be Covid positive.  During the hospitalization, the patient was found to have colitis.  The etiology is unclear possibly secondary to Covid/viral infection.  The patient was also found to have acute systolic heart failure.  Cardiology was consulted and the patient received Lasix and IV albumin.  The patient also received a platelet infusion. Review of additional lab work showed a negative Coombs test, normal fibrinogen level, mildly elevated D-dimer at 1.36,, elevated LDH of 461. Haptoglobin pending. The patient received Solu-Medrol 125 mg IV on  03/18/2020.  She also received dexamethasone 4 mg daily x5 days. It was felt that her thrombocytopenia was exacerbated by COVID-19 infection.   Since being discharged from the hospital, the patient is feeling _.  She denies any fever,, chills, night sweats, or weight loss.  He denies any abnormal bleeding or bruising.  Symptoms of infection?  She denies any nausea, vomiting, or constipation.  Diarrhea?  Abdominal pain?  Denies any other signs and symptoms of infection including skin infections or dysuria.  She reports stable neuropathy.  The patient is here today for evaluation before resuming her treatment.   MEDICAL HISTORY: Past Medical History:  Diagnosis Date  . Cancer ([place])     ALLERGIES:  has No Known Allergies.  MEDICATIONS:  Current Outpatient Medications  Medication Sig Dispense Refill  . acetaminophen (TYLENOL) 325 MG tablet Take 2 tablets (650 mg total) by mouth every 6 (six) hours as needed for mild pain (or Fever >/= 101).    Marland Kitchen acyclovir (ZOVIRAX) 200 MG capsule Take 1 capsule (200 mg total) by mouth 2 (two) times daily. 60 capsule 4  . amLODipine (NORVASC) 2.5 MG tablet Take 1 tablet (2.5 mg total) by mouth daily. 30 tablet 3  . blood glucose meter kit and supplies Dispense based on patient and insurance preference. Use up to four times daily as directed. (FOR ICD-10 E10.9, E11.9). 1 each 0  . carvedilol (COREG) 12.5 MG tablet Take 1 tablet (12.5 mg total) by mouth 2 (two) times daily with a meal. 60 tablet 3  . ferrous sulfate 325 (65 FE) MG tablet Take 1 tablet (325 mg total) by  mouth daily with breakfast. 30 tablet 3  . furosemide (LASIX) 40 MG tablet Take 1 tablet (40 mg total) by mouth 2 (two) times daily. 60 tablet 3  . hydrALAZINE (APRESOLINE) 100 MG tablet Take 1 tablet (100 mg total) by mouth every 8 (eight) hours. 90 tablet 3  . isosorbide mononitrate (IMDUR) 30 MG 24 hr tablet Take 1 tablet (30 mg total) by mouth daily. 30 tablet 3  . loratadine (CLARITIN) 10 MG  tablet Take 10 mg by mouth daily.    Marland Kitchen MELATONIN PO Take 10 mg by mouth at bedtime as needed (sleep).    Marland Kitchen omeprazole (PRILOSEC) 20 MG capsule Take 1 capsule (20 mg total) by mouth daily. 30 capsule 4  . ReliOn Ultra Thin Lancets 30G MISC USE 1 TO CHECK GLUCOSE ONCE DAILY FOR GLUCOSE TESTING     No current facility-administered medications for this visit.   Facility-Administered Medications Ordered in Other Visits  Medication Dose Route Frequency Provider Last Rate Last Admin  . sodium chloride flush (NS) 0.9 % injection 10 mL  10 mL Intracatheter PRN Curt Bears, MD   10 mL at 02/22/20 1127    SURGICAL HISTORY:  Past Surgical History:  Procedure Laterality Date  . IR IMAGING GUIDED PORT INSERTION  05/10/2019    REVIEW OF SYSTEMS:   Review of Systems  Constitutional: Negative for appetite change, chills, fatigue, fever and unexpected weight change.  HENT:   Negative for mouth sores, nosebleeds, sore throat and trouble swallowing.   Eyes: Negative for eye problems and icterus.  Respiratory: Negative for cough, hemoptysis, shortness of breath and wheezing.   Cardiovascular: Negative for chest pain and leg swelling.  Gastrointestinal: Negative for abdominal pain, constipation, diarrhea, nausea and vomiting.  Genitourinary: Negative for bladder incontinence, difficulty urinating, dysuria, frequency and hematuria.   Musculoskeletal: Negative for back pain, gait problem, neck pain and neck stiffness.  Skin: Negative for itching and rash.  Neurological: Negative for dizziness, extremity weakness, gait problem, headaches, light-headedness and seizures.  Hematological: Negative for adenopathy. Does not bruise/bleed easily.  Psychiatric/Behavioral: Negative for confusion, depression and sleep disturbance. The patient is not nervous/anxious.     PHYSICAL EXAMINATION:  There were no vitals taken for this visit.  ECOG PERFORMANCE STATUS: {CHL ONC ECOG [id number]  Physical Exam   Constitutional: Oriented to person, place, and time and well-developed, well-nourished, and in no distress. No distress.  HENT:  Head: Normocephalic and atraumatic.  Mouth/Throat: Oropharynx is clear and moist. No oropharyngeal exudate.  Eyes: Conjunctivae are normal. Right eye exhibits no discharge. Left eye exhibits no discharge. No scleral icterus.  Neck: Normal range of motion. Neck supple.  Cardiovascular: Normal rate, regular rhythm, normal heart sounds and intact distal pulses.   Pulmonary/Chest: Effort normal and breath sounds normal. No respiratory distress. No wheezes. No rales.  Abdominal: Soft. Bowel sounds are normal. Exhibits no distension and no mass. There is no tenderness.  Musculoskeletal: Normal range of motion. Exhibits no edema.  Lymphadenopathy:    No cervical adenopathy.  Neurological: Alert and oriented to person, place, and time. Exhibits normal muscle tone. Gait normal. Coordination normal.  Skin: Skin is warm and dry. No rash noted. Not diaphoretic. No erythema. No pallor.  Psychiatric: Mood, memory and judgment normal.  Vitals reviewed.  LABORATORY DATA: Lab Results  Component Value Date   WBC 3.5 (L) 04/03/2020   HGB 7.4 (L) 04/03/2020   HCT 22.2 (L) 04/03/2020   MCV 96.5 04/03/2020   PLT 74 (L) 04/03/2020  Chemistry      Component Value Date/Time   NA 131 (L) 04/04/2020 0333   K 3.8 04/04/2020 0333   CL 95 (L) 04/04/2020 0333   CO2 23 04/04/2020 0333   BUN 78 (H) 04/04/2020 0333   CREATININE 4.73 (H) 04/04/2020 0333   CREATININE 1.24 (H) 03/14/2020 0858      Component Value Date/Time   CALCIUM 8.3 (L) 04/04/2020 0333   ALKPHOS 47 03/20/2020 0414   AST 17 03/20/2020 0414   AST 17 03/14/2020 0858   ALT 11 03/20/2020 0414   ALT 11 03/14/2020 0858   BILITOT 0.9 03/20/2020 0414   BILITOT 0.7 03/14/2020 0858       RADIOGRAPHIC STUDIES:  CT ABDOMEN PELVIS WO CONTRAST  Result Date: 03/18/2020 CLINICAL DATA:  70 year old female with  left lower quadrant abdominal pain. Concern for diverticulitis. EXAM: CT ABDOMEN AND PELVIS WITHOUT CONTRAST TECHNIQUE: Multidetector CT imaging of the abdomen and pelvis was performed following the standard protocol without IV contrast. COMPARISON:  None. FINDINGS: Evaluation of this exam is limited in the absence of intravenous contrast. Lower chest: Partially visualized small bilateral pleural effusions with bibasilar subsegmental atelectasis. There is cardiomegaly with dilatation of the right atrium. No intra-abdominal free air. Small ascites. Hepatobiliary: Multiple hepatic cysts and several additional subcentimeter hypodensities which are too small to characterize. No intrahepatic biliary dilatation. The gallbladder is unremarkable. Pancreas: Unremarkable. No pancreatic ductal dilatation or surrounding inflammatory changes. Spleen: Normal in size without focal abnormality. Adrenals/Urinary Tract: Polycystic morphology of the kidneys with replacement of the majority of the renal parenchyma by cysts. Evaluation of the renal cyst is limited on this noncontrast CT. There is no hydronephrosis or nephrolithiasis on either side. The urinary bladder is grossly unremarkable. Stomach/Bowel: There is sigmoid diverticulosis. There is diffuse inflammatory changes and thickening of the distal descending and sigmoid colon consistent with colitis. Correlation with clinical exam and stool cultures recommended. No bowel obstruction. The appendix is normal. Vascular/Lymphatic: Moderate aortoiliac atherosclerotic disease. The IVC is unremarkable. No portal venous gas. There is no adenopathy. Reproductive: The uterus and ovaries are grossly unremarkable. Other: Mild subcutaneous edema. No fluid collection. Musculoskeletal: Osteopenia. No acute osseous pathology. IMPRESSION: 1. Colitis of the distal descending and sigmoid colon. Correlation with clinical exam and stool cultures recommended. No bowel obstruction. Normal appendix. 2.  Sigmoid diverticulosis. 3. Polycystic morphology of the kidneys. 4. Small bilateral pleural effusions with bibasilar subsegmental atelectasis. 5. Aortic Atherosclerosis (ICD10-I70.0). Electronically Signed   By: Anner Crete M.D.   On: 03/18/2020 21:33   US RENAL  Result Date: 03/20/2020 CLINICAL DATA:  Chronic kidney disease, acute renal failure EXAM: RENAL / URINARY TRACT ULTRASOUND COMPLETE COMPARISON:  None. FINDINGS: Right Kidney: Renal measurements: 12.9 x 5.8 x 6.5 cm = volume: 257.6 mL. Echogenicity is likely increased. Numerous cysts are present. No hydronephrosis visualized. Left Kidney: Renal measurements: 17.2 x 7.8 x 8.2 cm = volume: 576.5 mL. Echogenicity is likely increased. Numerous cysts are present. No hydronephrosis visualized. Bladder: Not visualized due to recent voiding. Other: None. IMPRESSION: No hydronephrosis. Numerous bilateral renal cysts. Increased echogenicity likely reflecting chronic medical renal disease. Electronically Signed   By: Macy Mis M.D.   On: 03/20/2020 11:05   DG CHEST PORT 1 VIEW  Result Date: 03/29/2020 CLINICAL DATA:  Shortness of breath. EXAM: PORTABLE CHEST 1 VIEW COMPARISON:  March 21, 2020. FINDINGS: Stable cardiomegaly. Right internal jugular Port-A-Cath is unchanged in position. No pneumothorax is noted. Increased bibasilar opacities are noted concerning for worsening atelectasis  or infiltrates with associated effusions. Bony thorax is unremarkable. IMPRESSION: Increased bibasilar opacities are noted concerning for worsening atelectasis or infiltrates with associated effusions. Electronically Signed   By: Marijo Conception M.D.   On: 03/29/2020 15:20   DG Chest Port 1 View  Result Date: 03/21/2020 CLINICAL DATA:  Cough.  COVID-19 positive EXAM: PORTABLE CHEST 1 VIEW COMPARISON:  March 19, 2020 FINDINGS: Minimal left pleural effusion. No edema or airspace opacity. There is cardiomegaly with pulmonary vascularity normal. No adenopathy.  Port-A-Cath tip is in the superior vena cava near the cavoatrial junction. Bones osteoporotic. IMPRESSION: Minimal left pleural effusion. No edema or airspace opacity. Stable cardiomegaly. Osteoporosis. Stable Port-A-Cath position. Electronically Signed   By: Lowella Grip III M.D.   On: 03/21/2020 13:30   DG CHEST PORT 1 VIEW  Result Date: 03/19/2020 CLINICAL DATA:  70 year old female with weakness. EXAM: PORTABLE CHEST 1 VIEW COMPARISON:  Chest radiograph dated 03/19/2020. FINDINGS: Right sided Port-A-Cath in similar position. There is shallow inspiration. Probable small left and trace right pleural effusions. Minimal left lung base atelectasis. Pneumonia is not excluded. No pneumothorax. No acute osseous pathology. Osteopenia. IMPRESSION: Shallow inspiration with probable small left and trace right pleural effusions. No significant interval change since the prior radiograph. Electronically Signed   By: Anner Crete M.D.   On: 03/19/2020 16:58   DG CHEST PORT 1 VIEW  Result Date: 03/19/2020 CLINICAL DATA:  Multiple myeloma, weakness EXAM: PORTABLE CHEST 1 VIEW COMPARISON:  01/25/2020 FINDINGS: The lungs are symmetrically well expanded. Tiny left pleural effusion is suspected. No pneumothorax. Right internal jugular chest port tip is seen within the superior vena cava, unchanged. Mild cardiomegaly is likely stable when accounting for changes in radiographic technique. Pulmonary vascularity is normal. No acute bone abnormality. IMPRESSION: Stable cardiomegaly.  Tiny left pleural effusion. Electronically Signed   By: Fidela Salisbury MD   On: 03/19/2020 00:55   ECHOCARDIOGRAM COMPLETE  Result Date: 03/19/2020    ECHOCARDIOGRAM REPORT   Patient Name:   KIMBERY HARWOOD Date of Exam: 03/19/2020 Medical Rec #:  [id number]    Height:       68.0 in Accession #:    [id number]   Weight:       187.0 lb Date of Birth:  1950-03-22    BSA:          1.986 m Patient Age:    84 years     BP:           166/87 mmHg  Patient Gender: F            HR:           68 bpm. Exam Location:  Inpatient Procedure: 2D Echo, Cardiac Doppler and Color Doppler Indications:    I51.7 Cardiomegaly  History:        Patient has prior history of Echocardiogram examinations, most                 recent 09/21/2017. Cancer.  Sonographer:    Jonelle Sidle Dance Referring Phys: [id number]  1. Left ventricular ejection fraction, by estimation, is 25 to 30%. The left ventricle has severely decreased function. The left ventricle demonstrates global hypokinesis although the septal LV segments appear slightly worse. Left ventricular diastolic parameters are consistent with Grade II diastolic dysfunction (pseudonormalization).  2. Right ventricular systolic function is mildly reduced. The right ventricular size is moderately enlarged. There is moderately elevated pulmonary artery systolic pressure.  3. The mitral valve is grossly  normal. Mild mitral valve regurgitation.  4. Right atrial size was massively dilated.  5. Tricuspid valve regurgitation is moderate to severe.  6. The aortic valve is tricuspid. There is mild calcification of the aortic valve. There is mild thickening of the aortic valve. Aortic valve regurgitation is mild.  7. Aortic dilatation noted. There is mild dilatation of the ascending aorta, measuring 36 mm.  8. The inferior vena cava is normal in size with greater than 50% respiratory variability, suggesting right atrial pressure of 3 mmHg. Comparison(s): Compared to prior study on 99/2426, the LV systolic function is now severely reduced. FINDINGS  Left Ventricle: Left ventricular ejection fraction, by estimation, is 25 to 30%. The left ventricle has severely decreased function. The left ventricle demonstrates global hypokinesis although the septal segments appear slightly worse. The left ventricular internal cavity size was normal in size. There is no left ventricular hypertrophy. Left ventricular diastolic parameters are  consistent with Grade II diastolic dysfunction (pseudonormalization). Right Ventricle: The right ventricular size is moderately enlarged. No increase in right ventricular wall thickness. Right ventricular systolic function is mildly reduced. There is moderately elevated pulmonary artery systolic pressure. The tricuspid regurgitant velocity is 3.47 m/s, and with an assumed right atrial pressure of 3 mmHg, the estimated right ventricular systolic pressure is 83.4 mmHg. Left Atrium: Left atrial size was normal in size. Right Atrium: Right atrial size was massively dilated. Pericardium: There is no evidence of pericardial effusion. Mitral Valve: The mitral valve is grossly normal. There is mild thickening of the mitral valve leaflet(s). Mild mitral valve regurgitation. Tricuspid Valve: The tricuspid valve is normal in structure. Tricuspid valve regurgitation is moderate to severe. Aortic Valve: The aortic valve is tricuspid. There is mild calcification of the aortic valve. There is mild thickening of the aortic valve. Aortic valve regurgitation is mild. Aortic regurgitation PHT measures 643 msec. Pulmonic Valve: The pulmonic valve was normal in structure. Pulmonic valve regurgitation is trivial. Aorta: Aortic dilatation noted. There is mild dilatation of the ascending aorta, measuring 36 mm. Venous: The inferior vena cava is normal in size with greater than 50% respiratory variability, suggesting right atrial pressure of 3 mmHg. IAS/Shunts: There is left bowing of the interatrial septum, suggestive of elevated right atrial pressure. No atrial level shunt detected by color flow Doppler.  LEFT VENTRICLE PLAX 2D LVIDd:         5.10 cm  Diastology LVIDs:         3.70 cm  LV e' medial:    5.77 cm/s LV PW:         1.40 cm  LV E/e' medial:  11.5 LV IVS:        0.90 cm  LV e' lateral:   8.59 cm/s LVOT diam:     2.00 cm  LV E/e' lateral: 7.7 LV SV:         46 LV SV Index:   23 LVOT Area:     3.14 cm  RIGHT VENTRICLE              IVC RV Basal diam:  4.30 cm     IVC diam: 2.20 cm RV Mid diam:    3.30 cm RV S prime:     11.70 cm/s TAPSE (M-mode): 2.2 cm LEFT ATRIUM             Index       RIGHT ATRIUM           Index LA diam:  4.20 cm 2.11 cm/m  RA Area:     48.30 cm LA Vol (A2C):   70.9 ml 35.70 ml/m RA Volume:   222.00 ml 111.77 ml/m LA Vol (A4C):   39.6 ml 19.94 ml/m LA Biplane Vol: 55.1 ml 27.74 ml/m  AORTIC VALVE LVOT Vmax:   73.70 cm/s LVOT Vmean:  51.150 cm/s LVOT VTI:    0.145 m AI PHT:      643 msec  AORTA Ao Root diam: 3.10 cm Ao Asc diam:  3.60 cm MITRAL VALVE               TRICUSPID VALVE MV Area (PHT): 2.01 cm    TR Peak grad:   48.2 mmHg MV Decel Time: 377 msec    TR Vmax:        347.00 cm/s MV E velocity: 66.30 cm/s MV A velocity: 52.10 cm/s  SHUNTS MV E/A ratio:  1.27        Systemic VTI:  0.14 m                            Systemic Diam: 2.00 cm Gwyndolyn Kaufman MD Electronically signed by Gwyndolyn Kaufman MD Signature Date/Time: 03/19/2020/3:14:30 PM    Final      ASSESSMENT/PLAN:  This is a very pleasant 70 years old African female originally from [place] who is visiting her son in [place] and was diagnosed with IgA multiple myeloma.   The patient was a started initially on treatment with weekly subcutaneous Velcade 1.3 mg/M2, Revlimid 25 mg p.o. daily for 21 days every 4 weeks as well as weekly Decadron 40 mg orally.  She was tolerating the treatment well but she developed significant skin rash secondary to treatment with Revlimid and this was discontinued.  The patient continued treatment with subcutaneous Velcade and Decadron.  She developed evidence of disease progression in February 2021.  Her treatment was discontinued.   The patient was then started on Pomalyst 4 mg p.o. daily for 21 days on every 4 weeks with weekly Decadron 40 mg p.o.  She is status post 6 days of treatment.  She developed a significant skin rash/reaction to Pomalyst.  This was discontinued.  The patient  is currently undergoing treatment with Carfilzomib on days 1, 2, 8, 9, 15, and 16 and Cytoxan on days 1, 8, and 15 IV every 4 weeks with 40 mg p.o. weekly of Decadron. She is status post 11 cycles.   The patient was recently hospitalized for colitis, COVID-19, acute kidney injury, thrombocytopenia, and heart failure.  The patient has _at this time.  The patient was seen with Dr. Earlie Server today.  Lab results were reviewed reviewed with the patient recommend that she _?????  I will arrange for _.   The patient was advised to call immediately if she has any concerning symptoms in the interval. The patient voices understanding of current disease status and treatment options and is in agreement with the current care plan. All questions were answered. The patient knows to call the clinic with any problems, questions or concerns. We can certainly see the patient much sooner if necessary   No orders of the defined types were placed in this encounter.    I spent {CHL ONC TIME VISIT - [id number]} counseling the patient face to face. The total time spent in the appointment was {CHL ONC TIME VISIT - [id number]:[id number]}.  [place]an Samford L Damonie Ellenwood, PA-C 04/11/20

## 2020-04-12 ENCOUNTER — Telehealth: Payer: Self-pay

## 2020-04-12 NOTE — Telephone Encounter (Signed)
Pt son, Alta Corning, left a message stating she cannot come to her appts 04/16/20 and requests for her appts to be moved to 2/24 and 2/25.  Schedule message has been sent.

## 2020-04-13 ENCOUNTER — Telehealth: Payer: Self-pay | Admitting: Physician Assistant

## 2020-04-13 NOTE — Telephone Encounter (Signed)
Scheduled appts per 2/16 sch msg - pt son is aware of appts on 2/28 and 3/1

## 2020-04-16 ENCOUNTER — Ambulatory Visit: Payer: Self-pay | Admitting: Physician Assistant

## 2020-04-16 ENCOUNTER — Other Ambulatory Visit: Payer: Self-pay

## 2020-04-16 NOTE — Progress Notes (Signed)
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  PRIOR THERAPY: 1) Systemic therapy with Velcade 1.3 mg/M2 weekly, Revlimid 25 mg p.o. daily for 14 days every 3 weeks in addition to weekly Decadron 40 mg orally. First dose of treatment 10/08/2017.  Treatment was placed on hold due to development of a significant rash.  She resumed treatment with only dexamethasone and Velcade on 11/19/2017. This was discontinued on 04/14/2019 due to evidence of disease progression.  2) Pomalyst 4 mg p.o. for 21 days every 4 weeks and 40 mg p.o. Decadron once a week. First dose 04/29/2019-05/04/2019. Status post 6 days of treatment. Discontinued secondary to allergy to Pomalyst.  3) Chemotherapy with carfilzomib on days 1, 2, 8, 9, 15, and 16 and Cytoxan 300 mg/m2 on days 1, 8, and 15 IV every 4 weeks and 40 mg p.o. weekly of Decadron. Last dose expected on 03/02/2020. Status post 11 cycles. Discontinued due to intolerance   CURRENT THERAPY: Chemotherapy with Daratumumab on days 1, 8, 15, and 22 IV every 4 weeks and 20 mg p.o. weekly of Decadron. First dose expected on 04/30/2020.   INTERVAL HISTORY: Debbie Chan 70 y.o. female returns to the clinic today for a follow up visit. In the interval since her last appointment, the patient was hospitalized from 03/18/2020 -04/04/2020 after presenting to the emergency room with weakness.  Labs showed acute kidney injury, hyponatremia, thrombocytopenia, and the patient was found to be Covid positive.    During the hospitalization, the patient was found to have colitis.  The etiology is unclear possibly secondary to Covid/viral infection.  The patient was also found to have acute systolic heart failure.  Cardiology was consulted and the patient received Lasix and IV albumin.  The patient also received a platelet infusion. Review of additional lab work showed a  negative Coombs test, normal fibrinogen level, mildly elevated D-dimer at 1.36,, elevated LDH of 461. Haptoglobin <10. The patient received Solu-Medrol 125 mg IV on 03/18/2020.  She also received dexamethasone 4 mg daily x5 days. It was felt that her thrombocytopenia was exacerbated by COVID-19 infection. She was also seen by nephrology in the hospital. Per discharge summary, she is not a candidate for dialysis. She has a myocardial perfusion imaging evaluation scheduled for 04/25/20. Her etiology of her heart failure was unclear but one possibly etiology was thought to be from cardiotoxicity from her carfilzomib.   Since being discharged from the hospital, the patient is feeling fatigued.  She denies any fever, chills, night sweats, or weight loss.  He denies any abnormal bleeding or bruising except she states she had nose bleeding in the hospital which has resolved. Denies melena or hematochezia.  She states she has not had a cough in 3 days. Her main complaint is itching and aching in her legs. Her son was available by phone and is wondering what size her legs are for compression socks. She denies any nausea, vomiting, diarrhea, or constipation Denies any other signs and symptoms of infection including skin infections or dysuria.  She reports stable neuropathy.  The patient is here today for evaluation before resuming her treatment.    MEDICAL HISTORY: Past Medical History:  Diagnosis Date  . Cancer ([place])     ALLERGIES:  has No Known Allergies.  MEDICATIONS:  Current Outpatient Medications  Medication Sig Dispense Refill  . acetaminophen (TYLENOL) 325 MG tablet Take 2 tablets (  650 mg total) by mouth every 6 (six) hours as needed for mild pain (or Fever >/= 101).    Marland Kitchen acyclovir (ZOVIRAX) 200 MG capsule Take 1 capsule (200 mg total) by mouth 2 (two) times daily. 60 capsule 4  . amLODipine (NORVASC) 2.5 MG tablet Take 1 tablet (2.5 mg total) by mouth daily. 30 tablet 3  . blood glucose meter kit and  supplies Dispense based on patient and insurance preference. Use up to four times daily as directed. (FOR ICD-10 E10.9, E11.9). 1 each 0  . carvedilol (COREG) 12.5 MG tablet Take 1 tablet (12.5 mg total) by mouth 2 (two) times daily with a meal. 60 tablet 3  . ferrous sulfate 325 (65 FE) MG tablet Take 1 tablet (325 mg total) by mouth daily with breakfast. 30 tablet 3  . furosemide (LASIX) 40 MG tablet Take 1 tablet (40 mg total) by mouth 2 (two) times daily. 60 tablet 3  . hydrALAZINE (APRESOLINE) 100 MG tablet Take 1 tablet (100 mg total) by mouth every 8 (eight) hours. 90 tablet 3  . isosorbide mononitrate (IMDUR) 30 MG 24 hr tablet Take 1 tablet (30 mg total) by mouth daily. 30 tablet 3  . loratadine (CLARITIN) 10 MG tablet Take 10 mg by mouth daily.    Marland Kitchen MELATONIN PO Take 10 mg by mouth at bedtime as needed (sleep).    Marland Kitchen omeprazole (PRILOSEC) 20 MG capsule Take 1 capsule (20 mg total) by mouth daily. 30 capsule 4  . ReliOn Ultra Thin Lancets 30G MISC USE 1 TO CHECK GLUCOSE ONCE DAILY FOR GLUCOSE TESTING     No current facility-administered medications for this visit.   Facility-Administered Medications Ordered in Other Visits  Medication Dose Route Frequency Provider Last Rate Last Admin  . sodium chloride flush (NS) 0.9 % injection 10 mL  10 mL Intracatheter PRN Curt Bears, MD   10 mL at 02/22/20 1127    SURGICAL HISTORY:  Past Surgical History:  Procedure Laterality Date  . IR IMAGING GUIDED PORT INSERTION  05/10/2019    REVIEW OF SYSTEMS:   Review of Systems  Constitutional: Positive for fatigue. Negative for appetite change, chills, fever and unexpected weight change.  HENT: Negative for mouth sores, nosebleeds, sore throat and trouble swallowing.   Eyes: Negative for eye problems and icterus.  Respiratory: Negative for cough, hemoptysis, shortness of breath and wheezing.   Cardiovascular: Positive for leg swelling. Negative for chest pain. Gastrointestinal: Negative for  abdominal pain, constipation, diarrhea, nausea and vomiting.  Genitourinary: Negative for bladder incontinence, difficulty urinating, dysuria, frequency and hematuria.   Musculoskeletal: Positive for neck pain. Negative for back pain, gait problem,  and neck stiffness.  Skin: Positive for itching. Negative for rash.  Neurological: Negative for dizziness, extremity weakness, gait problem, headaches, light-headedness and seizures.  Hematological: Negative for adenopathy. Does not bruise/bleed easily.  Psychiatric/Behavioral: Negative for confusion, depression and sleep disturbance. The patient is not nervous/anxious.     PHYSICAL EXAMINATION:  There were no vitals taken for this visit.  ECOG PERFORMANCE STATUS: 1 - Symptomatic but completely ambulatory  Physical Exam  Constitutional: Oriented to person, place, and time and well-developed, well-nourished, and in no distress.  HENT:  Head: Normocephalic and atraumatic.  Mouth/Throat: Oropharynx is clear and moist. No oropharyngeal exudate.  Eyes: Conjunctivae are normal. Right eye exhibits no discharge. Left eye exhibits no discharge. No scleral icterus.  Neck: Normal range of motion. Neck supple.  Cardiovascular: Normal rate, regular rhythm, normal heart sounds and  intact distal pulses.   Pulmonary/Chest: Effort normal and breath sounds normal. No respiratory distress. No wheezes. No rales.  Abdominal: Soft. Bowel sounds are normal. Exhibits no distension and no mass. There is no tenderness.  Musculoskeletal: Positive for lower extremity swelling. Normal range of motion.   Lymphadenopathy:    No cervical adenopathy.  Neurological: Alert and oriented to person, place, and time. Exhibits normal muscle tone. Gait normal. Coordination normal.  Skin: Skin is warm and dry. No rash noted. Not diaphoretic. No erythema. No pallor.  Psychiatric: Mood, memory and judgment normal.  Vitals reviewed.  LABORATORY DATA: Lab Results  Component Value  Date   WBC 3.5 (L) 04/03/2020   HGB 7.4 (L) 04/03/2020   HCT 22.2 (L) 04/03/2020   MCV 96.5 04/03/2020   PLT 74 (L) 04/03/2020      Chemistry      Component Value Date/Time   NA 131 (L) 04/04/2020 0333   K 3.8 04/04/2020 0333   CL 95 (L) 04/04/2020 0333   CO2 23 04/04/2020 0333   BUN 78 (H) 04/04/2020 0333   CREATININE 4.73 (H) 04/04/2020 0333   CREATININE 1.24 (H) 03/14/2020 0858      Component Value Date/Time   CALCIUM 8.3 (L) 04/04/2020 0333   ALKPHOS 47 03/20/2020 0414   AST 17 03/20/2020 0414   AST 17 03/14/2020 0858   ALT 11 03/20/2020 0414   ALT 11 03/14/2020 0858   BILITOT 0.9 03/20/2020 0414   BILITOT 0.7 03/14/2020 0858       RADIOGRAPHIC STUDIES:  CT ABDOMEN PELVIS WO CONTRAST  Result Date: 03/18/2020 CLINICAL DATA:  70 year old female with left lower quadrant abdominal pain. Concern for diverticulitis. EXAM: CT ABDOMEN AND PELVIS WITHOUT CONTRAST TECHNIQUE: Multidetector CT imaging of the abdomen and pelvis was performed following the standard protocol without IV contrast. COMPARISON:  None. FINDINGS: Evaluation of this exam is limited in the absence of intravenous contrast. Lower chest: Partially visualized small bilateral pleural effusions with bibasilar subsegmental atelectasis. There is cardiomegaly with dilatation of the right atrium. No intra-abdominal free air. Small ascites. Hepatobiliary: Multiple hepatic cysts and several additional subcentimeter hypodensities which are too small to characterize. No intrahepatic biliary dilatation. The gallbladder is unremarkable. Pancreas: Unremarkable. No pancreatic ductal dilatation or surrounding inflammatory changes. Spleen: Normal in size without focal abnormality. Adrenals/Urinary Tract: Polycystic morphology of the kidneys with replacement of the majority of the renal parenchyma by cysts. Evaluation of the renal cyst is limited on this noncontrast CT. There is no hydronephrosis or nephrolithiasis on either side. The  urinary bladder is grossly unremarkable. Stomach/Bowel: There is sigmoid diverticulosis. There is diffuse inflammatory changes and thickening of the distal descending and sigmoid colon consistent with colitis. Correlation with clinical exam and stool cultures recommended. No bowel obstruction. The appendix is normal. Vascular/Lymphatic: Moderate aortoiliac atherosclerotic disease. The IVC is unremarkable. No portal venous gas. There is no adenopathy. Reproductive: The uterus and ovaries are grossly unremarkable. Other: Mild subcutaneous edema. No fluid collection. Musculoskeletal: Osteopenia. No acute osseous pathology. IMPRESSION: 1. Colitis of the distal descending and sigmoid colon. Correlation with clinical exam and stool cultures recommended. No bowel obstruction. Normal appendix. 2. Sigmoid diverticulosis. 3. Polycystic morphology of the kidneys. 4. Small bilateral pleural effusions with bibasilar subsegmental atelectasis. 5. Aortic Atherosclerosis (ICD10-I70.0). Electronically Signed   By: Anner Crete M.D.   On: 03/18/2020 21:33   US RENAL  Result Date: 03/20/2020 CLINICAL DATA:  Chronic kidney disease, acute renal failure EXAM: RENAL / URINARY TRACT ULTRASOUND COMPLETE  COMPARISON:  None. FINDINGS: Right Kidney: Renal measurements: 12.9 x 5.8 x 6.5 cm = volume: 257.6 mL. Echogenicity is likely increased. Numerous cysts are present. No hydronephrosis visualized. Left Kidney: Renal measurements: 17.2 x 7.8 x 8.2 cm = volume: 576.5 mL. Echogenicity is likely increased. Numerous cysts are present. No hydronephrosis visualized. Bladder: Not visualized due to recent voiding. Other: None. IMPRESSION: No hydronephrosis. Numerous bilateral renal cysts. Increased echogenicity likely reflecting chronic medical renal disease. Electronically Signed   By: Macy Mis M.D.   On: 03/20/2020 11:05   DG CHEST PORT 1 VIEW  Result Date: 03/29/2020 CLINICAL DATA:  Shortness of breath. EXAM: PORTABLE CHEST 1 VIEW  COMPARISON:  March 21, 2020. FINDINGS: Stable cardiomegaly. Right internal jugular Port-A-Cath is unchanged in position. No pneumothorax is noted. Increased bibasilar opacities are noted concerning for worsening atelectasis or infiltrates with associated effusions. Bony thorax is unremarkable. IMPRESSION: Increased bibasilar opacities are noted concerning for worsening atelectasis or infiltrates with associated effusions. Electronically Signed   By: Marijo Conception M.D.   On: 03/29/2020 15:20   DG Chest Port 1 View  Result Date: 03/21/2020 CLINICAL DATA:  Cough.  COVID-19 positive EXAM: PORTABLE CHEST 1 VIEW COMPARISON:  March 19, 2020 FINDINGS: Minimal left pleural effusion. No edema or airspace opacity. There is cardiomegaly with pulmonary vascularity normal. No adenopathy. Port-A-Cath tip is in the superior vena cava near the cavoatrial junction. Bones osteoporotic. IMPRESSION: Minimal left pleural effusion. No edema or airspace opacity. Stable cardiomegaly. Osteoporosis. Stable Port-A-Cath position. Electronically Signed   By: Lowella Grip III M.D.   On: 03/21/2020 13:30   DG CHEST PORT 1 VIEW  Result Date: 03/19/2020 CLINICAL DATA:  70 year old female with weakness. EXAM: PORTABLE CHEST 1 VIEW COMPARISON:  Chest radiograph dated 03/19/2020. FINDINGS: Right sided Port-A-Cath in similar position. There is shallow inspiration. Probable small left and trace right pleural effusions. Minimal left lung base atelectasis. Pneumonia is not excluded. No pneumothorax. No acute osseous pathology. Osteopenia. IMPRESSION: Shallow inspiration with probable small left and trace right pleural effusions. No significant interval change since the prior radiograph. Electronically Signed   By: Anner Crete M.D.   On: 03/19/2020 16:58   DG CHEST PORT 1 VIEW  Result Date: 03/19/2020 CLINICAL DATA:  Multiple myeloma, weakness EXAM: PORTABLE CHEST 1 VIEW COMPARISON:  01/25/2020 FINDINGS: The lungs are  symmetrically well expanded. Tiny left pleural effusion is suspected. No pneumothorax. Right internal jugular chest port tip is seen within the superior vena cava, unchanged. Mild cardiomegaly is likely stable when accounting for changes in radiographic technique. Pulmonary vascularity is normal. No acute bone abnormality. IMPRESSION: Stable cardiomegaly.  Tiny left pleural effusion. Electronically Signed   By: Fidela Salisbury MD   On: 03/19/2020 00:55   ECHOCARDIOGRAM COMPLETE  Result Date: 03/19/2020    ECHOCARDIOGRAM REPORT   Patient Name:   Debbie Chan Date of Exam: 03/19/2020 Medical Rec #:  [id number]    Height:       68.0 in Accession #:    [id number]   Weight:       187.0 lb Date of Birth:  20-Nov-1950    BSA:          1.986 m Patient Age:    38 years     BP:           166/87 mmHg Patient Gender: F            HR:           68 bpm.  Exam Location:  Inpatient Procedure: 2D Echo, Cardiac Doppler and Color Doppler Indications:    I51.7 Cardiomegaly  History:        Patient has prior history of Echocardiogram examinations, most                 recent 09/21/2017. Cancer.  Sonographer:    Jonelle Sidle Dance Referring Phys: [id number] [place]  1. Left ventricular ejection fraction, by estimation, is 25 to 30%. The left ventricle has severely decreased function. The left ventricle demonstrates global hypokinesis although the septal LV segments appear slightly worse. Left ventricular diastolic parameters are consistent with Grade II diastolic dysfunction (pseudonormalization).  2. Right ventricular systolic function is mildly reduced. The right ventricular size is moderately enlarged. There is moderately elevated pulmonary artery systolic pressure.  3. The mitral valve is grossly normal. Mild mitral valve regurgitation.  4. Right atrial size was massively dilated.  5. Tricuspid valve regurgitation is moderate to severe.  6. The aortic valve is tricuspid. There is mild calcification of the aortic valve.  There is mild thickening of the aortic valve. Aortic valve regurgitation is mild.  7. Aortic dilatation noted. There is mild dilatation of the ascending aorta, measuring 36 mm.  8. The inferior vena cava is normal in size with greater than 50% respiratory variability, suggesting right atrial pressure of 3 mmHg. Comparison(s): Compared to prior study on 15/0569, the LV systolic function is now severely reduced. FINDINGS  Left Ventricle: Left ventricular ejection fraction, by estimation, is 25 to 30%. The left ventricle has severely decreased function. The left ventricle demonstrates global hypokinesis although the septal segments appear slightly worse. The left ventricular internal cavity size was normal in size. There is no left ventricular hypertrophy. Left ventricular diastolic parameters are consistent with Grade II diastolic dysfunction (pseudonormalization). Right Ventricle: The right ventricular size is moderately enlarged. No increase in right ventricular wall thickness. Right ventricular systolic function is mildly reduced. There is moderately elevated pulmonary artery systolic pressure. The tricuspid regurgitant velocity is 3.47 m/s, and with an assumed right atrial pressure of 3 mmHg, the estimated right ventricular systolic pressure is 79.4 mmHg. Left Atrium: Left atrial size was normal in size. Right Atrium: Right atrial size was massively dilated. Pericardium: There is no evidence of pericardial effusion. Mitral Valve: The mitral valve is grossly normal. There is mild thickening of the mitral valve leaflet(s). Mild mitral valve regurgitation. Tricuspid Valve: The tricuspid valve is normal in structure. Tricuspid valve regurgitation is moderate to severe. Aortic Valve: The aortic valve is tricuspid. There is mild calcification of the aortic valve. There is mild thickening of the aortic valve. Aortic valve regurgitation is mild. Aortic regurgitation PHT measures 643 msec. Pulmonic Valve: The pulmonic  valve was normal in structure. Pulmonic valve regurgitation is trivial. Aorta: Aortic dilatation noted. There is mild dilatation of the ascending aorta, measuring 36 mm. Venous: The inferior vena cava is normal in size with greater than 50% respiratory variability, suggesting right atrial pressure of 3 mmHg. IAS/Shunts: There is left bowing of the interatrial septum, suggestive of elevated right atrial pressure. No atrial level shunt detected by color flow Doppler.  LEFT VENTRICLE PLAX 2D LVIDd:         5.10 cm  Diastology LVIDs:         3.70 cm  LV e' medial:    5.77 cm/s LV PW:         1.40 cm  LV E/e' medial:  11.5 LV IVS:  0.90 cm  LV e' lateral:   8.59 cm/s LVOT diam:     2.00 cm  LV E/e' lateral: 7.7 LV SV:         46 LV SV Index:   23 LVOT Area:     3.14 cm  RIGHT VENTRICLE             IVC RV Basal diam:  4.30 cm     IVC diam: 2.20 cm RV Mid diam:    3.30 cm RV S prime:     11.70 cm/s TAPSE (M-mode): 2.2 cm LEFT ATRIUM             Index       RIGHT ATRIUM           Index LA diam:        4.20 cm 2.11 cm/m  RA Area:     48.30 cm LA Vol (A2C):   70.9 ml 35.70 ml/m RA Volume:   222.00 ml 111.77 ml/m LA Vol (A4C):   39.6 ml 19.94 ml/m LA Biplane Vol: 55.1 ml 27.74 ml/m  AORTIC VALVE LVOT Vmax:   73.70 cm/s LVOT Vmean:  51.150 cm/s LVOT VTI:    0.145 m AI PHT:      643 msec  AORTA Ao Root diam: 3.10 cm Ao Asc diam:  3.60 cm MITRAL VALVE               TRICUSPID VALVE MV Area (PHT): 2.01 cm    TR Peak grad:   48.2 mmHg MV Decel Time: 377 msec    TR Vmax:        347.00 cm/s MV E velocity: 66.30 cm/s MV A velocity: 52.10 cm/s  SHUNTS MV E/A ratio:  1.27        Systemic VTI:  0.14 m                            Systemic Diam: 2.00 cm Gwyndolyn Kaufman MD Electronically signed by Gwyndolyn Kaufman MD Signature Date/Time: 03/19/2020/3:14:30 PM    Final      ASSESSMENT/PLAN:  This is a very pleasant 70 year old African female originally from [place] who was visiting her son in [place] and  was diagnosed with IgA multiple myeloma.   The patient was a started initially on treatment with weekly subcutaneous Velcade 1.3 mg/M2, Revlimid 25 mg p.o. daily for 21 days every 4 weeks as well as weekly Decadron 40 mg orally.  She was tolerating the treatment well but she developed significant skin rash secondary to treatment with Revlimid and this was discontinued.  The patient continued treatment with subcutaneous Velcade and Decadron.  She developed evidence of disease progression in February 2021.  Her treatment was discontinued.   The patient was then started on Pomalyst 4 mg p.o. daily for 21 days on every 4 weeks with weekly Decadron 40 mg p.o.  She is status post 6 days of treatment.  She developed a significant skin rash/reaction to Pomalyst.  This was discontinued.  The patient was then undergoing treatment with Carfilzomib on days 1, 2, 8, 9, 15, and 16 and Cytoxan on days 1, 8, and 15 IV every 4 weeks with 40 mg p.o. weekly of Decadron. She is status post 11 cycles. This was discontinued due to intolerance.   The patient was recently hospitalized for colitis, COVID-19, acute kidney injury, thrombocytopenia, and heart failure. Unclear as to the etiology of the  heart failure cannot exclude cardiotoxicity from carfilzomib.   The patient was seen with Dr. Julien Nordmann today. Dr. Julien Nordmann had a lengthily discussion with the patient and her son, who was available by phone. Dr. Julien Nordmann discussed that he can switch her to dartumumab on days 1, 8, 15, and 22 IV every 4 weeks. The patient is not a good candidate for this in combination with pomalyst due to a very severe skin rash. The patient is interested in this option and she is expected to start her first dose of treatment next week.  The patient was given a handout on educational material about  Daratumumab.  Dr. Earlie Server also discussed that if there is evidence of disease progression on this treatment that he would strongly recommend that the  patient and her son consider hospice.   Discussed with the patient that she can discontinue her acyclovir.  Dr. Earlie Server like the patient to take 20 mg of prednisone weekly on the days of infusion.  I have sent a prescription to her pharmacy.   Her labs today show continued poor kidney function.  She also continues to have some thrombocytopenia which is stable in the 60 K region.  She also has anemia with a hemoglobin of 7.1.  We will arrange for the patient to receive 1 unit of blood today.  Due to her elevated creatinine Dr. Earlie Server recommends 1 L fluid. We will also administer lasix 20 mg IV today due to her heart failure.   She is supposed to have a nuclear stress test later this week by cardiology. Reviewed the patient's schedule with her.   We will see her back for follow-up visit in 2 weeks for evaluation with day 8 of cycle number one of her new treatment.  Her legs were measured for stockings at 16. Relayed information to her son.   The patient was advised to call immediately if she has any concerning symptoms in the interval. The patient voices understanding of current disease status and treatment options and is in agreement with the current care plan. All questions were answered. The patient knows to call the clinic with any problems, questions or concerns. We can certainly see the patient much sooner if necessary   No orders of the defined types were placed in this encounter.    I spent 40-49 minutes in this encounter   Valree Feild L Jalan Fariss, PA-C 04/16/20  ADDENDUM: Hematology/Oncology Attending: I had a face-to-face encounter with the patient today.  I reviewed her record and recommended her care plan.  This is a very pleasant 70 years old African female originally from [place] with diagnosis of multiple myeloma and July 2019. The patient underwent several chemotherapy regiment initially with subcutaneous Velcade, Revlimid and Decadron but Revlimid was discontinued  secondary to hypersensitivity reaction.  She initially did fairly with this regimen but she has evidence for disease progression and the patient was treated with Pomalyst and Decadron but she could not tolerate the Pomalyst secondary to hypersensitivity reaction again. The patient started treatment with carfilzomib, Cytoxan and Decadron status post 11 cycles. She was admitted recently to the hospital with COVID-19 infection and she was also found to have congestive heart failure and pancytopenia.  It was felt that her congestive heart failure could be secondary to her treatment with carfilzomib. The patient is recovering slowly but continues to have significant fatigue and weakness.  She came to the clinic by herself and we were able to reach out her son by phone during the visit.  I had a lengthy discussion with the patient and her son about her current condition and treatment options. Unfortunately the patient cannot tolerate the previous treatment with carfilzomib, Cytoxan and Decadron and this will be discontinued. I discussed with the patient other treatment options including palliative care versus third line/fourth line treatment with weekly daratumumab and Decadron.  We discussed with the patient the adverse effect of this treatment and she was giving handout about the treatment and adverse effects. The patient and her son are interested in proceeding with treatment with daratumumab and Decadron. She is expected to start the first cycle of this treatment next week. For the anemia of neoplastic disease and anemia of chronic kidney disease, we will arrange for the patient to receive 2 units of PRBCs transfusion today. For the renal insufficiency, we will arrange for the patient to receive 1 L of normal saline in the clinic today. The patient will come back for follow-up visit in 2 weeks for evaluation and management of any adverse effect of her treatment. She was advised to call immediately if she  has any other concerning symptoms in the interval. Unfortunately if the patient fails this treatment her treatment options will be very limited.  This was also discussed with the patient and her son.  Disclaimer: This note was dictated with voice recognition software. Similar sounding words can inadvertently be transcribed and may be missed upon review. Eilleen Kempf, MD 04/23/20

## 2020-04-16 NOTE — Progress Notes (Signed)
Cardiology Clinic Note   Patient Name: Charnay Nazario Date of Encounter: 04/17/2020  Primary Care Provider:  [name], MD Primary Cardiologist:  Glori Bickers, MD  Patient Profile    Deetya Drouillard 70 year old female presents the clinic today for follow-up evaluation of her atrial flutter and acute combined systolic and diastolic CHF.  Past Medical History    Past Medical History:  Diagnosis Date  . [facility]    Past Surgical History:  Procedure Laterality Date  . IR IMAGING GUIDED PORT INSERTION  05/10/2019    Allergies  No Known Allergies  History of Present Illness    Ms. Sorce has a PMH of multiple myeloma medicine on admission, status post 11 cycles of carfilzomib as well as cyclophosphamide, paroxysmal atrial flutter 2019, severe tricuspid regurgitation and right heart enlargement.   She was admitted to the hospital 03/19/2020 and discharged on 04/04/2020.  She was noted to have infectious colitis, acute renal insufficiency, severe thrombocytopenia and a severely depressed left ventricle systolic function.  EF 25-30% on admission.  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  She reported no anginal complaints to suggest coronary artery disease.  Plan for outpatient ischemic evaluation due to her renal function was discussed.  Her anticoagulation was discontinued due to her thrombocytopenia and normocytic anemia.  She presents the clinic today for follow-up evaluation with her son and states she is having trouble sleeping due to congestion.  We discussed daily weights, low-sodium diet, her echocardiogram, heart failure, and kidney function.  We used shared decision making to discuss moving forward with nuclear stress test.  I explained the procedure and risks.  They agree to move forward with the procedure.  I will give her the salty 6 diet sheet, and the [place] support stocking sheet, order a BMP, order nuclear stress test, and have her  follow-up with the advanced heart failure clinic after stress testing.  Today she denies chest pain, shortness of breath, increased lower extremity edema, fatigue, palpitations, melena, hematuria, hemoptysis, diaphoresis, weakness, presyncope, syncope, orthopnea, and PND.     Home Medications    Prior to Admission medications   Medication Sig Start Date End Date Taking? Authorizing Provider  acetaminophen (TYLENOL) 325 MG tablet Take 2 tablets (650 mg total) by mouth every 6 (six) hours as needed for mild pain (or Fever >/= 101). 04/04/20   Nita Sells, MD  acyclovir (ZOVIRAX) 200 MG capsule Take 1 capsule (200 mg total) by mouth 2 (two) times daily. 12/01/19   Heilingoetter, Cassandra L, PA-C  amLODipine (NORVASC) 2.5 MG tablet Take 1 tablet (2.5 mg total) by mouth daily. 04/05/20   Nita Sells, MD  blood glucose meter kit and supplies Dispense based on patient and insurance preference. Use up to four times daily as directed. (FOR ICD-10 E10.9, E11.9). 09/28/17   Arrien, Jimmy Picket, MD  carvedilol (COREG) 12.5 MG tablet Take 1 tablet (12.5 mg total) by mouth 2 (two) times daily with a meal. 04/04/20   Nita Sells, MD  ferrous sulfate 325 (65 FE) MG tablet Take 1 tablet (325 mg total) by mouth daily with breakfast. 04/05/20   Nita Sells, MD  furosemide (LASIX) 40 MG tablet Take 1 tablet (40 mg total) by mouth 2 (two) times daily. 04/04/20   Nita Sells, MD  hydrALAZINE (APRESOLINE) 100 MG tablet Take 1 tablet (100 mg total) by mouth every 8 (eight) hours. 04/04/20   Nita Sells, MD  isosorbide mononitrate (IMDUR) 30 MG 24 hr tablet  Take 1 tablet (30 mg total) by mouth daily. 04/05/20   Nita Sells, MD  loratadine (CLARITIN) 10 MG tablet Take 10 mg by mouth daily.    [provider]  MELATONIN PO Take 10 mg by mouth at bedtime as needed (sleep).    [provider]  omeprazole (PRILOSEC) 20 MG capsule Take 1 capsule (20 mg  total) by mouth daily. 12/01/19   Heilingoetter, Cassandra L, PA-C  ReliOn Ultra Thin Lancets 30G MISC USE 1 TO CHECK GLUCOSE ONCE DAILY FOR GLUCOSE TESTING 03/06/18   [provider]    Family History    Family History  Problem Relation Age of Onset  . Hypertension Other    She indicated that her mother is deceased. She indicated that her father is deceased. She indicated that the status of her other is unknown.  Social History    Social History   Socioeconomic History  . Marital status: Widowed    Spouse name: Not on file  . Number of children: 4  . Years of education: Not on file  . Highest education level: Not on file  Occupational History  . Not on file  Tobacco Use  . Smoking status: Never Smoker  . Smokeless tobacco: Never Used  Vaping Use  . Vaping Use: Never used  Substance and Sexual Activity  . Alcohol use: Never  . Drug use: Never  . Sexual activity: Not on file  Other Topics Concern  . Not on file  Social History Narrative  . Not on file   Social Determinants of Health   Financial Resource Strain: Not on file  Food Insecurity: Not on file  Transportation Needs: Not on file  Physical Activity: Not on file  Stress: Not on file  Social Connections: Not on file  Intimate Partner Violence: Not on file     Review of Systems    General:  No chills, fever, night sweats or weight changes.  Cardiovascular:  No chest pain, dyspnea on exertion, edema, orthopnea, palpitations, paroxysmal nocturnal dyspnea. Dermatological: No rash, lesions/masses Respiratory: No cough, dyspnea Urologic: No hematuria, dysuria Abdominal:   No nausea, vomiting, diarrhea, bright red blood per rectum, melena, or hematemesis Neurologic:  No visual changes, wkns, changes in mental status. All other systems reviewed and are otherwise negative except as noted above.  Physical Exam    VS:  BP 130/70   Pulse 69   Ht _0  (1.753 m)   Wt 197 lb 12.8 oz (89.7 kg)   SpO2 99%    BMI 29.21 kg/m  , BMI Body mass index is 29.21 kg/m. GEN: Well nourished, well developed, in no acute distress. HEENT: normal. Neck: Supple, no JVD, carotid bruits, or masses. Cardiac: RRR, no murmurs, rubs, or gallops. No clubbing, cyanosis, bilateral lower extremity nonpitting edema.  Radials/DP/PT 2+ and equal bilaterally.  Respiratory:  Respirations regular and unlabored, clear to auscultation bilaterally. GI: Soft, nontender, nondistended, BS + x 4. MS: no deformity or atrophy. Skin: warm and dry, no rash. Neuro:  Strength and sensation are intact. Psych: Normal affect.  Accessory Clinical Findings    Recent Labs: 03/19/2020: Magnesium 2.2; TSH 1.725 03/20/2020: ALT 11 03/29/2020: B Natriuretic Peptide 622.2 04/03/2020: Hemoglobin 7.4; Platelets 74 04/04/2020: BUN 78; Creatinine, Ser 4.73; Potassium 3.8; Sodium 131   Recent Lipid Panel No results found for: CHOL, TRIG, HDL, CHOLHDL, VLDL, LDLCALC, LDLDIRECT  ECG personally reviewed by me today-none today.  Echocardiogram 03/19/2020 IMPRESSIONS    1. Left ventricular ejection fraction,  by estimation, is 25 to 30%. The  left ventricle has severely decreased function. The left ventricle  demonstrates global hypokinesis although the septal LV segments appear  slightly worse. Left ventricular diastolic  parameters are consistent with Grade II diastolic dysfunction  (pseudonormalization).  2. Right ventricular systolic function is mildly reduced. The right  ventricular size is moderately enlarged. There is moderately elevated  pulmonary artery systolic pressure.  3. The mitral valve is grossly normal. Mild mitral valve regurgitation.  4. Right atrial size was massively dilated.  5. Tricuspid valve regurgitation is moderate to severe.  6. The aortic valve is tricuspid. There is mild calcification of the  aortic valve. There is mild thickening of the aortic valve. Aortic valve  regurgitation is mild.  7. Aortic dilatation  noted. 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  8. The inferior vena cava is normal in size with greater than 50%  respiratory variability, suggesting right atrial pressure of 3 mmHg.   Comparison(s): Compared to prior study on 99/3716, the LV systolic  function is now severely reduced.  Assessment & Plan   1.  Acute systolic and diastolic CHF-generalized bilateral nonpitting lower extremity edema.  No increased DOE or activity intolerance.  Felt to be secondary to chemotherapy in the setting of Carfilzomib use.  Plan made for outpatient ischemic evaluation while in the hospital due to AKI.  Lasix and albumin given while in hospital by nephrology for diuresis. Continue furosemide Heart healthy low-sodium diet-salty 6 given Increase physical activity as tolerated Daily weights-contact office with a weight increase of 3 pounds overnight or 5 pounds in 1 week Lower extremity support stockings-[place] support stocking sheet given. Elevate lower extremities when not active Order nuclear stress test  Shared Decision Making/Informed Consent The risks [chest pain, shortness of breath, cardiac arrhythmias, dizziness, blood pressure fluctuations, myocardial infarction, stroke/transient ischemic attack, nausea, vomiting, allergic reaction, radiation exposure, metallic taste sensation and life-threatening complications (estimated to be 1 in 10,000)], benefits (risk stratification, diagnosing coronary artery disease, treatment guidance) and alternatives of a nuclear stress test were discussed in detail with Ms. Denson and she agrees to proceed.   Essential hypertension-BP today 130/70.  Well-controlled at home. Continue amlodipine, carvedilol, hydralazine, Imdur Heart healthy low-sodium diet-salty 6 given Increase physical activity as tolerated  Paroxysmal atrial flutter-heart rate today 69.  Anticoagulation placed on hold due to worsening renal function and  thrombocytopenia.  CHA2DS2-VASc score 4 CHF, hypertension, female, age. Continue carvedilol Heart healthy low-sodium diet-salty 6 given Increase physical activity as tolerated Avoid triggers alcohol, chocolate, EtOH, dehydration etc.  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  Previous recurrent episodes of SVT with a rate of 150 bpm.  Carvedilol increased during admission with no recurrence. 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 Follows with nephrology and oncology   Disposition: Follow-up with Dr. Haroldine Laws or me after nuclear stress test.   Jossie Ng. Humzah Harty NP-C    04/17/2020, 3:12 PM [facility] [place] [address] Office [phone] Fax [phone]  Notice: This dictation was prepared with Dragon dictation along with smaller phrase technology. Any transcriptional errors that result from this process are unintentional and may not be corrected upon review.  I spent 15 minutes examining this patient, reviewing medications, and using patient centered shared decision making involving her cardiac care.  Prior to her visit I spent greater  than 20 minutes reviewing her past medical history,  medications, and prior cardiac tests.

## 2020-04-17 ENCOUNTER — Other Ambulatory Visit: Payer: Self-pay

## 2020-04-17 ENCOUNTER — Encounter: Payer: Self-pay | Admitting: General Practice

## 2020-04-17 ENCOUNTER — Ambulatory Visit (INDEPENDENT_AMBULATORY_CARE_PROVIDER_SITE_OTHER): Payer: Self-pay | Admitting: General Practice

## 2020-04-17 VITALS — BP 130/70 | HR 69 | Ht 69.0 in | Wt 197.8 lb

## 2020-04-17 DIAGNOSIS — I5041 Acute combined systolic (congestive) and diastolic (congestive) heart failure: Secondary | ICD-10-CM

## 2020-04-17 DIAGNOSIS — I429 Cardiomyopathy, unspecified: Secondary | ICD-10-CM

## 2020-04-17 DIAGNOSIS — I471 Supraventricular tachycardia: Secondary | ICD-10-CM

## 2020-04-17 DIAGNOSIS — C9000 Multiple myeloma not having achieved remission: Secondary | ICD-10-CM

## 2020-04-17 DIAGNOSIS — I10 Essential (primary) hypertension: Secondary | ICD-10-CM

## 2020-04-17 DIAGNOSIS — I480 Paroxysmal atrial fibrillation: Secondary | ICD-10-CM

## 2020-04-17 DIAGNOSIS — Z79899 Other long term (current) drug therapy: Secondary | ICD-10-CM

## 2020-04-17 NOTE — Patient Instructions (Signed)
Medication Instructions:  The current medical regimen is effective;  continue present plan and medications as directed. Please refer to the Current Medication list given to you today.  *If you need a refill on your cardiac medications before your next appointment, please call your pharmacy*  Lab Work: BMET TODAY If you have labs (blood work) drawn today and your tests are completely normal, you will receive your results only by:  [place] (if you have MyChart) OR A paper copy in the mail.  If you have any lab test that is abnormal or we need to change your treatment, we will call you to review the results. You may go to any [facility] that is convenient for you however, we do have a lab in our office that is able to assist you. You DO NOT need an appointment for our lab. The lab is open 8:00am and closes at 4:00pm. Lunch 12:45 - 1:45pm.  Testing/Procedures: Your physician has requested that you have a lexiscan myoview. A cardiac stress test is a cardiological test that measures the heart's ability to respond to external stress in a controlled clinical environment. The stress response is induced by intravenous pharmacological stimulation.   Special Instructions TAKE AND LOG YOUR WEIGHT DAILY; CALL IF YOUR WEIGHT IS >3lbs/DAY -OR- 5lbs/WEEK  PLEASE READ AND FOLLOW SALTY 6-ATTACHED-1,800 mg daily  PLEASE PURCHASE AND WEAR COMPRESSION STOCKINGS DAILY AND TAKE OFF AT BEDTIME. Compression stockings are elastic socks that squeeze the legs. They help to increase blood flow to the legs and to decrease swelling in the legs from fluid retention, and reduce the chance of developing blood clots in the lower legs. Please put on in the AM when dressing and off at night when dressing for bed.  LET THEM KNOW THAT YOU NEED KNEE HIGH'S WITH COMPRESSION OF 15-20 mmhg. [employer]  [place] ([place] [phone]); [place], [place] [address]; [phone]  EMAIL   [email or web address].  PLEASE MAKE  SURE TO ELEVATE YOUR FEET & LEGS WHILE SITTING, THIS WILL HELP WITH THE SWELLING ALSO.   Follow-Up: Your next appointment:  AFTER LEXISCAN  In Person with DR Haroldine Laws   At [facility], you and your health needs are our priority.  As part of our continuing mission to provide you with exceptional heart care, we have created designated Provider Care Teams.  These Care Teams include your primary Cardiologist (physician) and Advanced Practice Providers (APPs -  Physician Assistants and Nurse Practitioners) who all work together to provide you with the care you need, when you need it.  We recommend signing up for the patient portal called "MyChart".  Sign up information is provided on this After Visit Summary.  MyChart is used to connect with patients for Virtual Visits (Telemedicine).  Patients are able to view lab/test results, encounter notes, upcoming appointments, etc.  Non-urgent messages can be sent to your provider as well.   To learn more about what you can do with MyChart, go to [email or web address].

## 2020-04-18 ENCOUNTER — Ambulatory Visit: Payer: Self-pay

## 2020-04-18 LAB — BASIC METABOLIC PANEL
BUN/Creatinine Ratio: 16 (ref 12–28)
BUN: 46 mg/dL — ABNORMAL HIGH (ref 8–27)
CO2: 21 mmol/L (ref 20–29)
Calcium: 8.9 mg/dL (ref 8.7–10.3)
Chloride: 93 mmol/L — ABNORMAL LOW (ref 96–106)
Creatinine, Ser: 2.87 mg/dL — ABNORMAL HIGH (ref 0.57–1.00)
GFR calc Af Amer: 19 mL/min/{1.73_m2} — ABNORMAL LOW (ref >59)
GFR calc non Af Amer: 16 mL/min/{1.73_m2} — ABNORMAL LOW (ref >59)
Glucose: 82 mg/dL (ref 65–99)
Potassium: 4 mmol/L (ref 3.5–5.2)
Sodium: 132 mmol/L — ABNORMAL LOW (ref 134–144)

## 2020-04-19 ENCOUNTER — Telehealth: Payer: Self-pay | Admitting: Medical Oncology

## 2020-04-19 ENCOUNTER — Telehealth: Payer: Self-pay

## 2020-04-19 NOTE — Telephone Encounter (Signed)
Unable to LM no VM, why cancel schedule Lexiscan?

## 2020-04-19 NOTE — Telephone Encounter (Signed)
Son concerned that cardiologist /nephrology are concerned that Kyprolis is affecting her heart and kidneys. I told son that we will call him during the visit on Monday.

## 2020-04-19 NOTE — Telephone Encounter (Signed)
Per JC:ok for Lexiscan from kidney perspective,(discussed w/Dr Ellyn Hack), we are checking for ischemia.

## 2020-04-20 MED FILL — ACYCLOVIR 200 MG CAP: 200 | 30 days supply | Qty: 60 | Fill #4

## 2020-04-20 MED FILL — OMEPRAZOLE 20 MG CAP: 20 | 30 days supply | Qty: 30 | Fill #4

## 2020-04-20 NOTE — Telephone Encounter (Signed)
Returned call to pt's son Debbie Chan he states that he thinks that pt is too fragile to have [place]. Informed Debbie Chan that pt is not too fragile and this test does not use contrast so her kidneys will be fine and that we need to do this test to further dx her so we can decide how to make "her heart better". Debbie Chan states that he will bring her to have test done and does not need a referral for nephrologist pt saw them on Wednesday. He will have her here @ 730am.

## 2020-04-20 NOTE — Telephone Encounter (Signed)
Patient's son returning call. 

## 2020-04-23 ENCOUNTER — Inpatient Hospital Stay: Payer: Self-pay

## 2020-04-23 ENCOUNTER — Other Ambulatory Visit: Payer: Self-pay | Admitting: Internal Medicine

## 2020-04-23 ENCOUNTER — Other Ambulatory Visit: Payer: Self-pay

## 2020-04-23 ENCOUNTER — Encounter: Payer: Self-pay | Admitting: Physician Assistant

## 2020-04-23 ENCOUNTER — Inpatient Hospital Stay (HOSPITAL_BASED_OUTPATIENT_CLINIC_OR_DEPARTMENT_OTHER): Payer: Self-pay | Admitting: Physician Assistant

## 2020-04-23 ENCOUNTER — Inpatient Hospital Stay: Payer: Self-pay | Attending: Internal Medicine

## 2020-04-23 VITALS — BP 140/78 | HR 76 | Temp 98.6°F | Resp 18

## 2020-04-23 VITALS — BP 114/69 | HR 69 | Temp 97.2°F | Resp 13 | Ht 68.0 in | Wt 197.4 lb

## 2020-04-23 DIAGNOSIS — C9000 Multiple myeloma not having achieved remission: Secondary | ICD-10-CM

## 2020-04-23 DIAGNOSIS — Z7189 Other specified counseling: Secondary | ICD-10-CM

## 2020-04-23 DIAGNOSIS — Z79899 Other long term (current) drug therapy: Secondary | ICD-10-CM | POA: Insufficient documentation

## 2020-04-23 DIAGNOSIS — Z95828 Presence of other vascular implants and grafts: Secondary | ICD-10-CM

## 2020-04-23 DIAGNOSIS — D649 Anemia, unspecified: Secondary | ICD-10-CM

## 2020-04-23 LAB — CMP (CANCER CENTER ONLY)
ALT: <6 U/L (ref 0–44)
AST: 14 U/L — ABNORMAL LOW (ref 15–41)
Albumin: 3.1 g/dL — ABNORMAL LOW (ref 3.5–5.0)
Alkaline Phosphatase: 52 U/L (ref 38–126)
Anion gap: 9 (ref 5–15)
BUN: 45 mg/dL — ABNORMAL HIGH (ref 8–23)
CO2: 23 mmol/L (ref 22–32)
Calcium: 8.4 mg/dL — ABNORMAL LOW (ref 8.9–10.3)
Chloride: 97 mmol/L — ABNORMAL LOW (ref 98–111)
Creatinine: 3.26 mg/dL (ref 0.44–1.00)
GFR, Estimated: 15 mL/min — ABNORMAL LOW (ref >60)
Glucose, Bld: 95 mg/dL (ref 70–99)
Potassium: 3.7 mmol/L (ref 3.5–5.1)
Sodium: 129 mmol/L — ABNORMAL LOW (ref 135–145)
Total Bilirubin: 0.4 mg/dL (ref 0.3–1.2)
Total Protein: 5.5 g/dL — ABNORMAL LOW (ref 6.5–8.1)

## 2020-04-23 LAB — CBC WITH DIFFERENTIAL (CANCER CENTER ONLY)
Abs Immature Granulocytes: 0.01 10*3/uL (ref 0.00–0.07)
Basophils Absolute: 0 10*3/uL (ref 0.0–0.1)
Basophils Relative: 1 %
Eosinophils Absolute: 0.1 10*3/uL (ref 0.0–0.5)
Eosinophils Relative: 3 %
HCT: 20.3 % — ABNORMAL LOW (ref 36.0–46.0)
Hemoglobin: 7.1 g/dL — ABNORMAL LOW (ref 12.0–15.0)
Immature Granulocytes: 0 %
Lymphocytes Relative: 18 %
Lymphs Abs: 0.6 10*3/uL — ABNORMAL LOW (ref 0.7–4.0)
MCH: 33.6 pg (ref 26.0–34.0)
MCHC: 35 g/dL (ref 30.0–36.0)
MCV: 96.2 fL (ref 80.0–100.0)
Monocytes Absolute: 0.4 10*3/uL (ref 0.1–1.0)
Monocytes Relative: 12 %
Neutro Abs: 2.2 10*3/uL (ref 1.7–7.7)
Neutrophils Relative %: 66 %
Platelet Count: 65 10*3/uL — ABNORMAL LOW (ref 150–400)
RBC: 2.11 MIL/uL — ABNORMAL LOW (ref 3.87–5.11)
RDW: 13.7 % (ref 11.5–15.5)
WBC Count: 3.3 10*3/uL — ABNORMAL LOW (ref 4.0–10.5)
nRBC: 0 % (ref 0.0–0.2)

## 2020-04-23 LAB — PREPARE RBC (CROSSMATCH)

## 2020-04-23 MED ORDER — ACETAMINOPHEN 325 MG PO TABS
650.0000 mg | ORAL_TABLET | Freq: Once | ORAL | Status: AC
  Administered 2020-04-23: 650 mg via ORAL

## 2020-04-23 MED ORDER — SODIUM CHLORIDE 0.9 % IV SOLN
Freq: Once | INTRAVENOUS | Status: AC
  Filled 2020-04-23: qty 250

## 2020-04-23 MED ORDER — SODIUM CHLORIDE 0.9% FLUSH
10.0000 mL | INTRAVENOUS | Status: DC | PRN
  Administered 2020-04-23: 10 mL
  Filled 2020-04-23: qty 10

## 2020-04-23 MED ORDER — FUROSEMIDE 10 MG/ML IJ SOLN
20.0000 mg | Freq: Once | INTRAMUSCULAR | Status: AC
  Administered 2020-04-23: 20 mg via INTRAVENOUS

## 2020-04-23 MED ORDER — DIPHENHYDRAMINE HCL 50 MG/ML IJ SOLN
INTRAMUSCULAR | Status: AC
  Filled 2020-04-23: qty 1

## 2020-04-23 MED ORDER — FUROSEMIDE 10 MG/ML IJ SOLN
INTRAMUSCULAR | Status: AC
  Filled 2020-04-23: qty 2

## 2020-04-23 MED ORDER — HEPARIN SOD (PORK) LOCK FLUSH 100 UNIT/ML IV SOLN
500.0000 [IU] | Freq: Once | INTRAVENOUS | Status: AC | PRN
  Administered 2020-04-23: 500 [IU]
  Filled 2020-04-23: qty 5

## 2020-04-23 MED ORDER — ACETAMINOPHEN 325 MG PO TABS
ORAL_TABLET | ORAL | Status: AC
  Filled 2020-04-23: qty 2

## 2020-04-23 MED ORDER — DIPHENHYDRAMINE HCL 50 MG/ML IJ SOLN
25.0000 mg | Freq: Once | INTRAMUSCULAR | Status: AC
  Administered 2020-04-23: 25 mg via INTRAVENOUS

## 2020-04-23 MED ORDER — SODIUM CHLORIDE 0.9% IV SOLUTION
250.0000 mL | Freq: Once | INTRAVENOUS | Status: AC
  Administered 2020-04-23: 250 mL via INTRAVENOUS
  Filled 2020-04-23: qty 250

## 2020-04-23 MED ORDER — DEXAMETHASONE 20 MG PO TABS: ORAL_TABLET | ORAL | 2 refills | Status: DC

## 2020-04-23 MED ORDER — DEXAMETHASONE 4 MG PO TABS: ORAL_TABLET | ORAL | 2 refills | Status: DC

## 2020-04-23 MED FILL — DEXAMETHASONE 4 MG TABLET: 4 | 84 days supply | Qty: 60 | Fill #0

## 2020-04-23 NOTE — Progress Notes (Signed)
DISCONTINUE ON PATHWAY REGIMEN - Multiple Myeloma and Other Plasma Cell Dyscrasias     A cycle is every 28 days:     Dexamethasone      Cyclophosphamide      Carfilzomib      Carfilzomib      Carfilzomib   **Always confirm dose/schedule in your pharmacy ordering system**  REASON: Disease Progression PRIOR TREATMENT: [id number]: KCd (Carfilzomib 20/36 mg/m2 + Cyclophosphamide PO 500 mg + Dexamethasone PO 40 mg) q28 Days Until Progression or Unacceptable Toxicity TREATMENT RESPONSE: Progressive Disease (PD)  START ON PATHWAY REGIMEN - Multiple Myeloma and Other Plasma Cell Dyscrasias     Cycles 1 and 2: A cycle is every 28 days:     Daratumumab    Cycles 3 through 6: A cycle is every 28 days:     Daratumumab    Cycles 7 and beyond: A cycle is every 28 days:     Daratumumab   **Always confirm dose/schedule in your pharmacy ordering system**  Patient Characteristics: Multiple Myeloma, Relapsed / Refractory, Second through Fourth Lines of Therapy, Frail or Not a Candidate for Triplet Therapy Disease Classification: Multiple Myeloma R-ISS Staging: III Therapeutic Status: Relapsed Line of Therapy: Third Line Intent of Therapy: Non-Curative / Palliative Intent, Discussed with Patient 

## 2020-04-23 NOTE — Patient Instructions (Signed)
Implanted Port Insertion, Care After This sheet gives you information about how to care for yourself after your procedure. Your health care provider may also give you more specific instructions. If you have problems or questions, contact your health care provider. What can I expect after the procedure? After the procedure, it is common to have:  Discomfort at the port insertion site.  Bruising on the skin over the port. This should improve over 3-4 days. Follow these instructions at home: Port care  After your port is placed, you will get a manufacturer's information card. The card has information about your port. Keep this card with you at all times.  Take care of the port as told by your health care provider. Ask your health care provider if you or a family member can get training for taking care of the port at home. A home health care nurse may also take care of the port.  Make sure to remember what type of port you have. Incision care  Follow instructions from your health care provider about how to take care of your port insertion site. Make sure you: ? Wash your hands with soap and water before and after you change your bandage (dressing). If soap and water are not available, use hand sanitizer. ? Change your dressing as told by your health care provider. ? Leave stitches (sutures), skin glue, or adhesive strips in place. These skin closures may need to stay in place for 2 weeks or longer. If adhesive strip edges start to loosen and curl up, you may trim the loose edges. Do not remove adhesive strips completely unless your health care provider tells you to do that.  Check your port insertion site every day for signs of infection. Check for: ? Redness, swelling, or pain. ? Fluid or blood. ? Warmth. ? Pus or a bad smell.      Activity  Return to your normal activities as told by your health care provider. Ask your health care provider what activities are safe for you.  Do not  lift anything that is heavier than 10 lb (4.5 kg), or the limit that you are told, until your health care provider says that it is safe. General instructions  Take over-the-counter and prescription medicines only as told by your health care provider.  Do not take baths, swim, or use a hot tub until your health care provider approves. Ask your health care provider if you may take showers. You may only be allowed to take sponge baths.  Do not drive for 24 hours if you were given a sedative during your procedure.  Wear a medical alert bracelet in case of an emergency. This will tell any health care providers that you have a port.  Keep all follow-up visits as told by your health care provider. This is important. Contact a health care provider if:  You cannot flush your port with saline as directed, or you cannot draw blood from the port.  You have a fever or chills.  You have redness, swelling, or pain around your port insertion site.  You have fluid or blood coming from your port insertion site.  Your port insertion site feels warm to the touch.  You have pus or a bad smell coming from the port insertion site. Get help right away if:  You have chest pain or shortness of breath.  You have bleeding from your port that you cannot control. Summary  Take care of the port as told by your   health care provider. Keep the manufacturer's information card with you at all times.  Change your dressing as told by your health care provider.  Contact a health care provider if you have a fever or chills or if you have redness, swelling, or pain around your port insertion site.  Keep all follow-up visits as told by your health care provider. This information is not intended to replace advice given to you by your health care provider. Make sure you discuss any questions you have with your health care provider. Document Revised: 09/08/2017 Document Reviewed: 09/08/2017 Elsevier Patient Education   2021 [employer]  

## 2020-04-23 NOTE — Patient Instructions (Signed)
Blood Transfusion, Adult, Care After This sheet gives you information about how to care for yourself after your procedure. Your doctor may also give you more specific instructions. If you have problems or questions, contact your doctor. What can I expect after the procedure? After the procedure, it is common to have:  Bruising and soreness at the IV site.  A fever or chills on the day of the procedure. This may be your body's response to the new blood cells received.  A headache. Follow these instructions at home: Insertion site care  Follow instructions from your doctor about how to take care of your insertion site. This is where an IV tube was put into your vein. Make sure you: ? Wash your hands with soap and water before and after you change your bandage (dressing). If you cannot use soap and water, use hand sanitizer. ? Change your bandage as told by your doctor.  Check your insertion site every day for signs of infection. Check for: ? Redness, swelling, or pain. ? Bleeding from the site. ? Warmth. ? Pus or a bad smell.      General instructions  Take over-the-counter and prescription medicines only as told by your doctor.  Rest as told by your doctor.  Go back to your normal activities as told by your doctor.  Keep all follow-up visits as told by your doctor. This is important. Contact a doctor if:  You have itching or red, swollen areas of skin (hives).  You feel worried or nervous (anxious).  You feel weak after doing your normal activities.  You have redness, swelling, warmth, or pain around the insertion site.  You have blood coming from the insertion site, and the blood does not stop with pressure.  You have pus or a bad smell coming from the insertion site. Get help right away if:  You have signs of a serious reaction. This may be coming from an allergy or the body's defense system (immune system). Signs include: ? Trouble breathing or shortness of  breath. ? Swelling of the face or feeling warm (flushed). ? Fever or chills. ? Head, chest, or back pain. ? Dark pee (urine) or blood in the pee. ? Widespread rash. ? Fast heartbeat. ? Feeling dizzy or light-headed. You may receive your blood transfusion in an outpatient setting. If so, you will be told whom to contact to report any reactions. These symptoms may be an emergency. Do not wait to see if the symptoms will go away. Get medical help right away. Call your local emergency services (911 in the [place] Do not drive yourself to the hospital. Summary  Bruising and soreness at the IV site are common.  Check your insertion site every day for signs of infection.  Rest as told by your doctor. Go back to your normal activities as told by your doctor.  Get help right away if you have signs of a serious reaction. This information is not intended to replace advice given to you by your health care provider. Make sure you discuss any questions you have with your health care provider. Document Revised: 08/05/2018 Document Reviewed: 08/05/2018 Elsevier Patient Education  2021 [place].  Rehydration, Adult Rehydration is the replacement of body fluids, salts, and minerals (electrolytes) that are lost during dehydration. Dehydration is when there is not enough water or other fluids in the body. This happens when you lose more fluids than you take in. Common causes of dehydration include:  Not drinking enough fluids. This  can occur when you are ill or doing activities that require a lot of energy, especially in hot weather.  Conditions that cause loss of water or other fluids, such as diarrhea, vomiting, sweating, or urinating a lot.  Other illnesses, such as fever or infection.  Certain medicines, such as those that remove excess fluid from the body (diuretics). Symptoms of mild or moderate dehydration may include thirst, dry lips and mouth, and dizziness. Symptoms of severe dehydration  may include increased heart rate, confusion, fainting, and not urinating. For severe dehydration, you may need to get fluids through an IV at the hospital. For mild or moderate dehydration, you can usually rehydrate at home by drinking certain fluids as told by your health care provider. What are the risks? Generally, rehydration is safe. However, taking in too much fluid (overhydration) can be a problem. This is rare. Overhydration can cause an electrolyte imbalance, kidney failure, or a decrease in salt (sodium) levels in the body. Supplies needed You will need an oral rehydration solution (ORS) if your health care provider tells you to use one. This is a drink to treat dehydration. It can be found in pharmacies and retail stores. How to rehydrate Fluids Follow instructions from your health care provider for rehydration. The kind of fluid and the amount you should drink depend on your condition. In general, you should choose drinks that you prefer.  If told by your health care provider, drink an ORS. ? Make an ORS by following instructions on the package. ? Start by drinking small amounts, about  cup (120 mL) every 5-10 minutes. ? Slowly increase how much you drink until you have taken the amount recommended by your health care provider.  Drink enough clear fluids to keep your urine pale yellow. If you were told to drink an ORS, finish it first, then start slowly drinking other clear fluids. Drink fluids such as: ? Water. This includes sparkling water and flavored water. Drinking only water can lead to having too little sodium in your body (hyponatremia). Follow the advice of your health care provider. ? Water from ice chips you suck on. ? Fruit juice with water you add to it (diluted). ? Sports drinks. ? Hot or cold herbal teas. ? Broth-based soups. ? Milk or milk products. Food Follow instructions from your health care provider about what to eat while you rehydrate. Your health care  provider may recommend that you slowly begin eating regular foods in small amounts.  Eat foods that contain a healthy balance of electrolytes, such as bananas, oranges, potatoes, tomatoes, and spinach.  Avoid foods that are greasy or contain a lot of sugar. In some cases, you may get nutrition through a feeding tube that is passed through your nose and into your stomach (nasogastric tube, or NG tube). This may be done if you have uncontrolled vomiting or diarrhea.   Beverages to avoid Certain beverages may make dehydration worse. While you rehydrate, avoid drinking alcohol.   How to tell if you are recovering from dehydration You may be recovering from dehydration if:  You are urinating more often than before you started rehydrating.  Your urine is pale yellow.  Your energy level improves.  You vomit less frequently.  You have diarrhea less frequently.  Your appetite improves or returns to normal.  You feel less dizzy or less light-headed.  Your skin tone and color start to look more normal. Follow these instructions at home:  Take over-the-counter and prescription medicines only as told  by your health care provider.  Do not take sodium tablets. Doing this can lead to having too much sodium in your body (hypernatremia). Contact a health care provider if:  You continue to have symptoms of mild or moderate dehydration, such as: ? Thirst. ? Dry lips. ? Slightly dry mouth. ? Dizziness. ? Dark urine or less urine than normal. ? Muscle cramps.  You continue to vomit or have diarrhea. Get help right away if you:  Have symptoms of dehydration that get worse.  Have a fever.  Have a severe headache.  Have been vomiting and the following happens: ? Your vomiting gets worse or does not go away. ? Your vomit includes blood or green matter (bile). ? You cannot eat or drink without vomiting.  Have problems with urination or bowel movements, such as: ? Diarrhea that gets worse  or does not go away. ? Blood in your stool (feces). This may cause stool to look black and tarry. ? Not urinating, or urinating only a small amount of very dark urine, within 6-8 hours.  Have trouble breathing.  Have symptoms that get worse with treatment. These symptoms may represent a serious problem that is an emergency. Do not wait to see if the symptoms will go away. Get medical help right away. Call your local emergency services (911 in the [place] Do not drive yourself to the hospital. Summary  Rehydration is the replacement of body fluids and minerals (electrolytes) that are lost during dehydration.  Follow instructions from your health care provider for rehydration. The kind of fluid and amount you should drink depend on your condition.  Slowly increase how much you drink until you have taken the amount recommended by your health care provider.  Contact your health care provider if you continue to show signs of mild or moderate dehydration. This information is not intended to replace advice given to you by your health care provider. Make sure you discuss any questions you have with your health care provider. Document Revised: 04/13/2019 Document Reviewed: 02/21/2019 Elsevier Patient Education  2021 [employer].

## 2020-04-23 NOTE — Patient Instructions (Signed)
Daratumumab injection What is this medicine? DARATUMUMAB (dar a toom ue mab) is a monoclonal antibody. It is used to treat multiple myeloma. This medicine may be used for other purposes; ask your health care provider or pharmacist if you have questions. COMMON BRAND NAME(S): DARZALEX What should I tell my health care provider before I take this medicine? They need to know if you have any of these conditions:  hereditary fructose intolerance  infection (especially a virus infection such as chickenpox, herpes, or hepatitis B virus)  lung or breathing disease (asthma, COPD)  an unusual or allergic reaction to daratumumab, sorbitol, other medicines, foods, dyes, or preservatives  pregnant or trying to get pregnant  breast-feeding How should I use this medicine? This medicine is for infusion into a vein. It is given by a health care professional in a hospital or clinic setting. Talk to your pediatrician regarding the use of this medicine in children. Special care may be needed. Overdosage: If you think you have taken too much of this medicine contact a poison control center or emergency room at once. NOTE: This medicine is only for you. Do not share this medicine with others. What if I miss a dose? Keep appointments for follow-up doses as directed. It is important not to miss your dose. Call your doctor or health care professional if you are unable to keep an appointment. What may interact with this medicine? Interactions have not been studied. This list may not describe all possible interactions. Give your health care provider a list of all the medicines, herbs, non-prescription drugs, or dietary supplements you use. Also tell them if you smoke, drink alcohol, or use illegal drugs. Some items may interact with your medicine. What should I watch for while using this medicine? Your condition will be monitored carefully while you are receiving this medicine. This medicine can cause serious  allergic reactions. To reduce your risk, your health care provider may give you other medicine to take before receiving this one. Be sure to follow the directions from your health care provider. This medicine can affect the results of blood tests to match your blood type. These changes can last for up to 6 months after the final dose. Your healthcare provider will do blood tests to match your blood type before you start treatment. Tell all of your healthcare providers that you are being treated with this medicine before receiving a blood transfusion. This medicine can affect the results of some tests used to determine treatment response; extra tests may be needed to evaluate response. Do not become pregnant while taking this medicine or for 3 months after stopping it. Women should inform their health care provider if they wish to become pregnant or think they might be pregnant. There is a potential for serious side effects to an unborn child. Talk to your health care provider for more information. Do not breast-feed an infant while taking this medicine. What side effects may I notice from receiving this medicine? Side effects that you should report to your doctor or health care professional as soon as possible:  allergic reactions (skin rash; itching or hives; swelling of the face, lips, or tongue)  infection (fever, chills, cough, sore throat, pain or difficulty passing urine)  infusion reaction (dizziness, fast heartbeat, feeling faint or lightheaded, falls, headache, increase in blood pressure, nausea, vomiting, or wheezing or trouble breathing with loud or whistling sounds)  unusual bleeding or bruising Side effects that usually do not require medical attention (report to your doctor  or health care professional if they continue or are bothersome):  constipation  diarrhea  pain, tingling, numbness in the hands or feet  swelling of the ankles, feet, hands  tiredness This list may not  describe all possible side effects. Call your doctor for medical advice about side effects. You may report side effects to FDA at 1-800-FDA-1088. Where should I keep my medicine? This drug is given in a hospital or clinic and will not be stored at home. NOTE: This sheet is a summary. It may not cover all possible information. If you have questions about this medicine, talk to your doctor, pharmacist, or health care provider.  2021 Elsevier/[employer] Standard (2020-02-02 13:28:52)

## 2020-04-24 ENCOUNTER — Inpatient Hospital Stay: Payer: Self-pay

## 2020-04-24 ENCOUNTER — Telehealth: Payer: Self-pay | Admitting: Physician Assistant

## 2020-04-24 LAB — TYPE AND SCREEN
ABO/RH(D): O POS
Antibody Screen: NEGATIVE
Unit division: 0

## 2020-04-24 LAB — BPAM RBC
Blood Product Expiration Date: 202203302359
ISSUE DATE / TIME: 202202281111
Unit Type and Rh: 5100

## 2020-04-24 NOTE — Telephone Encounter (Signed)
Scheduled appointments per 2/28 los. Spoke to patient's son who is aware of appointments dates and times.

## 2020-04-25 ENCOUNTER — Other Ambulatory Visit: Payer: Self-pay | Admitting: *Deleted

## 2020-04-25 ENCOUNTER — Encounter: Payer: Self-pay | Admitting: General Practice

## 2020-04-25 ENCOUNTER — Encounter (HOSPITAL_COMMUNITY): Payer: Self-pay

## 2020-04-25 ENCOUNTER — Other Ambulatory Visit: Payer: Self-pay

## 2020-04-25 ENCOUNTER — Ambulatory Visit (HOSPITAL_COMMUNITY)
Admission: RE | Admit: 2020-04-25 | Discharge: 2020-04-25 | Disposition: A | Discharge status: Home / Self Care | Payer: Self-pay | Source: Ambulatory Visit | Attending: Cardiology | Admitting: Cardiology

## 2020-04-25 ENCOUNTER — Telehealth: Payer: Self-pay | Admitting: General Practice

## 2020-04-25 ENCOUNTER — Telehealth (HOSPITAL_COMMUNITY): Payer: Self-pay | Admitting: *Deleted

## 2020-04-25 ENCOUNTER — Encounter (HOSPITAL_COMMUNITY): Payer: Self-pay | Admitting: *Deleted

## 2020-04-25 DIAGNOSIS — I429 Cardiomyopathy, unspecified: Secondary | ICD-10-CM

## 2020-04-25 DIAGNOSIS — I5041 Acute combined systolic (congestive) and diastolic (congestive) heart failure: Secondary | ICD-10-CM

## 2020-04-25 NOTE — Telephone Encounter (Signed)
Spoke with son regarding Pershing Proud scheduled Thursday 05/03/20 at 10:00 am at [facility]---arrival time is 9:30 am--1st floor radiology for check in ---NPO 6 hrs prior to study ---no caffeine 24 hours prior.  Will mail information to patient

## 2020-04-25 NOTE — Progress Notes (Signed)
Pt here for a Nuc MPI study. Test was canceled and rescheduled at [facility] due to no IV access.

## 2020-04-25 NOTE — Telephone Encounter (Signed)
Close encounter 

## 2020-04-27 ENCOUNTER — Telehealth: Payer: Self-pay

## 2020-04-27 NOTE — Telephone Encounter (Signed)
I called and spoke with pts son, Alta Corning, to advise pts appts on Monday 3/7 has been cancelled due to her pt assistance for her new tx still pending decision. I advised this appt has been moved to 3/17 with an arrival time of 7:30am. I also advised we are tentatively scheduling her for 3/24 and 3/31 but the exact time is TBD by the scheduling department. He expressed understanding of this information.

## 2020-04-27 NOTE — Progress Notes (Signed)
Determination for Darzalex pt assistance not made and will not know for 7-10 days per Juan Quam (IV assistance). Have requested that pt be rescheduled until assistance is obtained.  Kennith Center, Pharm.D., CPP 04/27/2020@10 :15 AM

## 2020-04-30 ENCOUNTER — Other Ambulatory Visit: Payer: Self-pay

## 2020-04-30 ENCOUNTER — Ambulatory Visit: Payer: Self-pay

## 2020-05-01 MED FILL — FERROUS SULFATE 325 MG TAB: 325 (65 FE) | 30 days supply | Qty: 30 | Fill #1

## 2020-05-02 MED FILL — FUROSEMIDE 40 MG TAB: 40 | 30 days supply | Qty: 60 | Fill #1

## 2020-05-02 MED FILL — ISOSORBIDE MONONITRATE ER 3: 30 | 30 days supply | Qty: 30 | Fill #1

## 2020-05-02 MED FILL — CARVEDILOL 12.5 MG TABLET: 12.5 | 30 days supply | Qty: 60 | Fill #1

## 2020-05-02 MED FILL — AMLODIPINE 2.5 MG TABLET: 2.5 | 30 days supply | Qty: 30 | Fill #1

## 2020-05-02 MED FILL — hydrALAZINE HCL 100 MG TABS: 100 | 30 days supply | Qty: 90 | Fill #1

## 2020-05-03 ENCOUNTER — Telehealth: Payer: Self-pay | Admitting: Medical Oncology

## 2020-05-03 ENCOUNTER — Ambulatory Visit (HOSPITAL_COMMUNITY): Payer: Self-pay

## 2020-05-03 NOTE — Telephone Encounter (Signed)
Persistent daily headaches at the back of her head x past 4 days. It lingers all day .  Hands shake occasionally .   Tylenol 1000 mg give some relief but not adequate.   Taking her norvasc,hydralazine, coreg, imdur.  Denies balance issues , weakness .   Please advise.

## 2020-05-04 NOTE — Telephone Encounter (Signed)
Could be post Covid effect.  We will continue to monitor for now.

## 2020-05-07 ENCOUNTER — Inpatient Hospital Stay: Payer: Self-pay

## 2020-05-07 ENCOUNTER — Inpatient Hospital Stay: Payer: Self-pay | Admitting: Internal Medicine

## 2020-05-07 NOTE — Progress Notes (Signed)
..  The following Medication: Darzalex has been approved thru [employer] as Assistance Program. Enrollment period is 05/05/2020 to 05/05/2021.  Assistance ID: [id number].  Reason for Assistance: Self Pay First DOS: 05/10/2020.

## 2020-05-07 NOTE — Progress Notes (Signed)
..  The following Assist/Replace Program for Kyprolis from [employer] has been terminated due to Dr. Julien Nordmann discontinued treatment plan.  Last DOS: 03/15/2020.

## 2020-05-10 ENCOUNTER — Inpatient Hospital Stay: Payer: Self-pay | Attending: Internal Medicine

## 2020-05-10 ENCOUNTER — Inpatient Hospital Stay: Payer: Self-pay

## 2020-05-10 ENCOUNTER — Other Ambulatory Visit: Payer: Self-pay

## 2020-05-10 DIAGNOSIS — C9000 Multiple myeloma not having achieved remission: Secondary | ICD-10-CM

## 2020-05-10 DIAGNOSIS — Z95828 Presence of other vascular implants and grafts: Secondary | ICD-10-CM

## 2020-05-10 LAB — CBC WITH DIFFERENTIAL (CANCER CENTER ONLY)
Abs Immature Granulocytes: 0 10*3/uL (ref 0.00–0.07)
Basophils Absolute: 0 10*3/uL (ref 0.0–0.1)
Basophils Relative: 1 %
Eosinophils Absolute: 0.2 10*3/uL (ref 0.0–0.5)
Eosinophils Relative: 6 %
HCT: 24.6 % — ABNORMAL LOW (ref 36.0–46.0)
Hemoglobin: 8.4 g/dL — ABNORMAL LOW (ref 12.0–15.0)
Immature Granulocytes: 0 %
Lymphocytes Relative: 24 %
Lymphs Abs: 0.8 10*3/uL (ref 0.7–4.0)
MCH: 31.6 pg (ref 26.0–34.0)
MCHC: 34.1 g/dL (ref 30.0–36.0)
MCV: 92.5 fL (ref 80.0–100.0)
Monocytes Absolute: 0.3 10*3/uL (ref 0.1–1.0)
Monocytes Relative: 11 %
Neutro Abs: 1.8 10*3/uL (ref 1.7–7.7)
Neutrophils Relative %: 58 %
Platelet Count: 43 10*3/uL — ABNORMAL LOW (ref 150–400)
RBC: 2.66 MIL/uL — ABNORMAL LOW (ref 3.87–5.11)
RDW: 14 % (ref 11.5–15.5)
WBC Count: 3.1 10*3/uL — ABNORMAL LOW (ref 4.0–10.5)
nRBC: 0 % (ref 0.0–0.2)

## 2020-05-10 LAB — CMP (CANCER CENTER ONLY)
ALT: 8 U/L (ref 0–44)
AST: 13 U/L — ABNORMAL LOW (ref 15–41)
Albumin: 3.2 g/dL — ABNORMAL LOW (ref 3.5–5.0)
Alkaline Phosphatase: 59 U/L (ref 38–126)
Anion gap: 12 (ref 5–15)
BUN: 39 mg/dL — ABNORMAL HIGH (ref 8–23)
CO2: 22 mmol/L (ref 22–32)
Calcium: 8.5 mg/dL — ABNORMAL LOW (ref 8.9–10.3)
Chloride: 102 mmol/L (ref 98–111)
Creatinine: 2.85 mg/dL — ABNORMAL HIGH (ref 0.44–1.00)
GFR, Estimated: 17 mL/min — ABNORMAL LOW (ref >60)
Glucose, Bld: 144 mg/dL — ABNORMAL HIGH (ref 70–99)
Potassium: 4 mmol/L (ref 3.5–5.1)
Sodium: 136 mmol/L (ref 135–145)
Total Bilirubin: 0.3 mg/dL (ref 0.3–1.2)
Total Protein: 5.8 g/dL — ABNORMAL LOW (ref 6.5–8.1)

## 2020-05-10 MED ORDER — HEPARIN SOD (PORK) LOCK FLUSH 100 UNIT/ML IV SOLN
500.0000 [IU] | Freq: Once | INTRAVENOUS | Status: AC | PRN
  Administered 2020-05-10: 500 [IU]
  Filled 2020-05-10: qty 5

## 2020-05-10 MED ORDER — SODIUM CHLORIDE 0.9% FLUSH
10.0000 mL | INTRAVENOUS | Status: DC | PRN
  Administered 2020-05-10: 10 mL
  Filled 2020-05-10: qty 10

## 2020-05-10 NOTE — Progress Notes (Signed)
Patient's platelets are 43 today.  Per Dr. Julien Nordmann no tx today, will see MD next week.

## 2020-05-10 NOTE — Patient Instructions (Signed)
Implanted Port Insertion, Care After This sheet gives you information about how to care for yourself after your procedure. Your health care provider may also give you more specific instructions. If you have problems or questions, contact your health care provider. What can I expect after the procedure? After the procedure, it is common to have:  Discomfort at the port insertion site.  Bruising on the skin over the port. This should improve over 3-4 days. Follow these instructions at home: Port care  After your port is placed, you will get a manufacturer's information card. The card has information about your port. Keep this card with you at all times.  Take care of the port as told by your health care provider. Ask your health care provider if you or a family member can get training for taking care of the port at home. A home health care nurse may also take care of the port.  Make sure to remember what type of port you have. Incision care  Follow instructions from your health care provider about how to take care of your port insertion site. Make sure you: ? Wash your hands with soap and water before and after you change your bandage (dressing). If soap and water are not available, use hand sanitizer. ? Change your dressing as told by your health care provider. ? Leave stitches (sutures), skin glue, or adhesive strips in place. These skin closures may need to stay in place for 2 weeks or longer. If adhesive strip edges start to loosen and curl up, you may trim the loose edges. Do not remove adhesive strips completely unless your health care provider tells you to do that.  Check your port insertion site every day for signs of infection. Check for: ? Redness, swelling, or pain. ? Fluid or blood. ? Warmth. ? Pus or a bad smell.      Activity  Return to your normal activities as told by your health care provider. Ask your health care provider what activities are safe for you.  Do not  lift anything that is heavier than 10 lb (4.5 kg), or the limit that you are told, until your health care provider says that it is safe. General instructions  Take over-the-counter and prescription medicines only as told by your health care provider.  Do not take baths, swim, or use a hot tub until your health care provider approves. Ask your health care provider if you may take showers. You may only be allowed to take sponge baths.  Do not drive for 24 hours if you were given a sedative during your procedure.  Wear a medical alert bracelet in case of an emergency. This will tell any health care providers that you have a port.  Keep all follow-up visits as told by your health care provider. This is important. Contact a health care provider if:  You cannot flush your port with saline as directed, or you cannot draw blood from the port.  You have a fever or chills.  You have redness, swelling, or pain around your port insertion site.  You have fluid or blood coming from your port insertion site.  Your port insertion site feels warm to the touch.  You have pus or a bad smell coming from the port insertion site. Get help right away if:  You have chest pain or shortness of breath.  You have bleeding from your port that you cannot control. Summary  Take care of the port as told by your   health care provider. Keep the manufacturer's information card with you at all times.  Change your dressing as told by your health care provider.  Contact a health care provider if you have a fever or chills or if you have redness, swelling, or pain around your port insertion site.  Keep all follow-up visits as told by your health care provider. This information is not intended to replace advice given to you by your health care provider. Make sure you discuss any questions you have with your health care provider. Document Revised: 09/08/2017 Document Reviewed: 09/08/2017 Elsevier Patient Education   2021 [employer]  

## 2020-05-14 ENCOUNTER — Ambulatory Visit: Payer: Self-pay

## 2020-05-14 ENCOUNTER — Other Ambulatory Visit: Payer: Self-pay

## 2020-05-16 ENCOUNTER — Telehealth: Payer: Self-pay | Admitting: Medical Oncology

## 2020-05-16 NOTE — Telephone Encounter (Signed)
Long tx tomorrow-Debbie Chan said he will have pt here by 0715.tomorrow. He stressed that her labs need to be drawn from her port . Appts changed.

## 2020-05-17 ENCOUNTER — Other Ambulatory Visit: Payer: Self-pay

## 2020-05-17 ENCOUNTER — Inpatient Hospital Stay (HOSPITAL_BASED_OUTPATIENT_CLINIC_OR_DEPARTMENT_OTHER): Payer: Self-pay | Admitting: Internal Medicine

## 2020-05-17 ENCOUNTER — Inpatient Hospital Stay: Payer: Self-pay

## 2020-05-17 VITALS — BP 156/81 | HR 82 | Temp 98.8°F | Resp 16 | Wt 196.5 lb

## 2020-05-17 DIAGNOSIS — C9000 Multiple myeloma not having achieved remission: Secondary | ICD-10-CM

## 2020-05-17 DIAGNOSIS — Z5111 Encounter for antineoplastic chemotherapy: Secondary | ICD-10-CM

## 2020-05-17 DIAGNOSIS — D696 Thrombocytopenia, unspecified: Secondary | ICD-10-CM

## 2020-05-17 DIAGNOSIS — Z95828 Presence of other vascular implants and grafts: Secondary | ICD-10-CM

## 2020-05-17 LAB — CBC WITH DIFFERENTIAL (CANCER CENTER ONLY)
Abs Immature Granulocytes: 0.01 10*3/uL (ref 0.00–0.07)
Basophils Absolute: 0 10*3/uL (ref 0.0–0.1)
Basophils Relative: 1 %
Eosinophils Absolute: 0.3 10*3/uL (ref 0.0–0.5)
Eosinophils Relative: 8 %
HCT: 25.3 % — ABNORMAL LOW (ref 36.0–46.0)
Hemoglobin: 8.7 g/dL — ABNORMAL LOW (ref 12.0–15.0)
Immature Granulocytes: 0 %
Lymphocytes Relative: 24 %
Lymphs Abs: 0.8 10*3/uL (ref 0.7–4.0)
MCH: 31.1 pg (ref 26.0–34.0)
MCHC: 34.4 g/dL (ref 30.0–36.0)
MCV: 90.4 fL (ref 80.0–100.0)
Monocytes Absolute: 0.4 10*3/uL (ref 0.1–1.0)
Monocytes Relative: 12 %
Neutro Abs: 1.8 10*3/uL (ref 1.7–7.7)
Neutrophils Relative %: 55 %
Platelet Count: 47 10*3/uL — ABNORMAL LOW (ref 150–400)
RBC: 2.8 MIL/uL — ABNORMAL LOW (ref 3.87–5.11)
RDW: 13.3 % (ref 11.5–15.5)
WBC Count: 3.3 10*3/uL — ABNORMAL LOW (ref 4.0–10.5)
nRBC: 0 % (ref 0.0–0.2)

## 2020-05-17 LAB — CMP (CANCER CENTER ONLY)
ALT: 10 U/L (ref 0–44)
AST: 15 U/L (ref 15–41)
Albumin: 3.2 g/dL — ABNORMAL LOW (ref 3.5–5.0)
Alkaline Phosphatase: 69 U/L (ref 38–126)
Anion gap: 11 (ref 5–15)
BUN: 28 mg/dL — ABNORMAL HIGH (ref 8–23)
CO2: 22 mmol/L (ref 22–32)
Calcium: 8.3 mg/dL — ABNORMAL LOW (ref 8.9–10.3)
Chloride: 100 mmol/L (ref 98–111)
Creatinine: 2.6 mg/dL — ABNORMAL HIGH (ref 0.44–1.00)
GFR, Estimated: 19 mL/min — ABNORMAL LOW (ref >60)
Glucose, Bld: 133 mg/dL — ABNORMAL HIGH (ref 70–99)
Potassium: 4.3 mmol/L (ref 3.5–5.1)
Sodium: 133 mmol/L — ABNORMAL LOW (ref 135–145)
Total Bilirubin: 0.4 mg/dL (ref 0.3–1.2)
Total Protein: 5.9 g/dL — ABNORMAL LOW (ref 6.5–8.1)

## 2020-05-17 MED ORDER — HEPARIN SOD (PORK) LOCK FLUSH 100 UNIT/ML IV SOLN
500.0000 [IU] | Freq: Once | INTRAVENOUS | Status: AC | PRN
  Administered 2020-05-17: 500 [IU]
  Filled 2020-05-17: qty 5

## 2020-05-17 MED ORDER — METHYLPREDNISOLONE SODIUM SUCC 125 MG IJ SOLR
100.0000 mg | Freq: Once | INTRAMUSCULAR | Status: AC
  Administered 2020-05-17: 100 mg via INTRAVENOUS

## 2020-05-17 MED ORDER — SODIUM CHLORIDE 0.9 % IV SOLN
Freq: Once | INTRAVENOUS | Status: AC
  Filled 2020-05-17: qty 250

## 2020-05-17 MED ORDER — DARATUMUMAB CHEMO INJECTION 400 MG/20ML
10.0000 mg/kg | Freq: Once | INTRAVENOUS | Status: AC
  Administered 2020-05-17: 900 mg via INTRAVENOUS
  Filled 2020-05-17: qty 40

## 2020-05-17 MED ORDER — MONTELUKAST SODIUM 10 MG PO TABS
10.0000 mg | ORAL_TABLET | Freq: Once | ORAL | Status: AC
  Administered 2020-05-17: 10 mg via ORAL

## 2020-05-17 MED ORDER — MONTELUKAST SODIUM 10 MG PO TABS
ORAL_TABLET | ORAL | Status: AC
  Filled 2020-05-17: qty 1

## 2020-05-17 MED ORDER — METHYLPREDNISOLONE SODIUM SUCC 125 MG IJ SOLR
INTRAMUSCULAR | Status: AC
  Filled 2020-05-17: qty 2

## 2020-05-17 MED ORDER — ACETAMINOPHEN 325 MG PO TABS
ORAL_TABLET | ORAL | Status: AC
  Filled 2020-05-17: qty 2

## 2020-05-17 MED ORDER — DIPHENHYDRAMINE HCL 25 MG PO CAPS
50.0000 mg | ORAL_CAPSULE | Freq: Once | ORAL | Status: AC
  Administered 2020-05-17: 50 mg via ORAL

## 2020-05-17 MED ORDER — ACETAMINOPHEN 325 MG PO TABS
650.0000 mg | ORAL_TABLET | Freq: Once | ORAL | Status: AC
  Administered 2020-05-17: 650 mg via ORAL

## 2020-05-17 MED ORDER — DIPHENHYDRAMINE HCL 25 MG PO CAPS
ORAL_CAPSULE | ORAL | Status: AC
  Filled 2020-05-17: qty 2

## 2020-05-17 MED ORDER — SODIUM CHLORIDE 0.9% FLUSH
10.0000 mL | INTRAVENOUS | Status: DC | PRN
  Administered 2020-05-17: 10 mL
  Filled 2020-05-17: qty 10

## 2020-05-17 NOTE — Patient Instructions (Signed)
Daratumumab injection What is this medicine? DARATUMUMAB (dar a toom ue mab) is a monoclonal antibody. It is used to treat multiple myeloma. This medicine may be used for other purposes; ask your health care provider or pharmacist if you have questions. COMMON BRAND NAME(S): DARZALEX What should I tell my health care provider before I take this medicine? They need to know if you have any of these conditions:  hereditary fructose intolerance  infection (especially a virus infection such as chickenpox, herpes, or hepatitis B virus)  lung or breathing disease (asthma, COPD)  an unusual or allergic reaction to daratumumab, sorbitol, other medicines, foods, dyes, or preservatives  pregnant or trying to get pregnant  breast-feeding How should I use this medicine? This medicine is for infusion into a vein. It is given by a health care professional in a hospital or clinic setting. Talk to your pediatrician regarding the use of this medicine in children. Special care may be needed. Overdosage: If you think you have taken too much of this medicine contact a poison control center or emergency room at once. NOTE: This medicine is only for you. Do not share this medicine with others. What if I miss a dose? Keep appointments for follow-up doses as directed. It is important not to miss your dose. Call your doctor or health care professional if you are unable to keep an appointment. What may interact with this medicine? Interactions have not been studied. This list may not describe all possible interactions. Give your health care provider a list of all the medicines, herbs, non-prescription drugs, or dietary supplements you use. Also tell them if you smoke, drink alcohol, or use illegal drugs. Some items may interact with your medicine. What should I watch for while using this medicine? Your condition will be monitored carefully while you are receiving this medicine. This medicine can cause serious  allergic reactions. To reduce your risk, your health care provider may give you other medicine to take before receiving this one. Be sure to follow the directions from your health care provider. This medicine can affect the results of blood tests to match your blood type. These changes can last for up to 6 months after the final dose. Your healthcare provider will do blood tests to match your blood type before you start treatment. Tell all of your healthcare providers that you are being treated with this medicine before receiving a blood transfusion. This medicine can affect the results of some tests used to determine treatment response; extra tests may be needed to evaluate response. Do not become pregnant while taking this medicine or for 3 months after stopping it. Women should inform their health care provider if they wish to become pregnant or think they might be pregnant. There is a potential for serious side effects to an unborn child. Talk to your health care provider for more information. Do not breast-feed an infant while taking this medicine. What side effects may I notice from receiving this medicine? Side effects that you should report to your doctor or health care professional as soon as possible:  allergic reactions (skin rash; itching or hives; swelling of the face, lips, or tongue)  infection (fever, chills, cough, sore throat, pain or difficulty passing urine)  infusion reaction (dizziness, fast heartbeat, feeling faint or lightheaded, falls, headache, increase in blood pressure, nausea, vomiting, or wheezing or trouble breathing with loud or whistling sounds)  unusual bleeding or bruising Side effects that usually do not require medical attention (report to your doctor  or health care professional if they continue or are bothersome):  constipation  diarrhea  pain, tingling, numbness in the hands or feet  swelling of the ankles, feet, hands  tiredness This list may not  describe all possible side effects. Call your doctor for medical advice about side effects. You may report side effects to FDA at 1-800-FDA-1088. Where should I keep my medicine? This drug is given in a hospital or clinic and will not be stored at home. NOTE: This sheet is a summary. It may not cover all possible information. If you have questions about this medicine, talk to your doctor, pharmacist, or health care provider.  2021 Elsevier/[employer] Standard (2020-02-02 13:28:52)

## 2020-05-17 NOTE — Patient Instructions (Signed)
Implanted Port Insertion, Care After This sheet gives you information about how to care for yourself after your procedure. Your health care provider may also give you more specific instructions. If you have problems or questions, contact your health care provider. What can I expect after the procedure? After the procedure, it is common to have:  Discomfort at the port insertion site.  Bruising on the skin over the port. This should improve over 3-4 days. Follow these instructions at home: Port care  After your port is placed, you will get a manufacturer's information card. The card has information about your port. Keep this card with you at all times.  Take care of the port as told by your health care provider. Ask your health care provider if you or a family member can get training for taking care of the port at home. A home health care nurse may also take care of the port.  Make sure to remember what type of port you have. Incision care  Follow instructions from your health care provider about how to take care of your port insertion site. Make sure you: ? Wash your hands with soap and water before and after you change your bandage (dressing). If soap and water are not available, use hand sanitizer. ? Change your dressing as told by your health care provider. ? Leave stitches (sutures), skin glue, or adhesive strips in place. These skin closures may need to stay in place for 2 weeks or longer. If adhesive strip edges start to loosen and curl up, you may trim the loose edges. Do not remove adhesive strips completely unless your health care provider tells you to do that.  Check your port insertion site every day for signs of infection. Check for: ? Redness, swelling, or pain. ? Fluid or blood. ? Warmth. ? Pus or a bad smell.      Activity  Return to your normal activities as told by your health care provider. Ask your health care provider what activities are safe for you.  Do not  lift anything that is heavier than 10 lb (4.5 kg), or the limit that you are told, until your health care provider says that it is safe. General instructions  Take over-the-counter and prescription medicines only as told by your health care provider.  Do not take baths, swim, or use a hot tub until your health care provider approves. Ask your health care provider if you may take showers. You may only be allowed to take sponge baths.  Do not drive for 24 hours if you were given a sedative during your procedure.  Wear a medical alert bracelet in case of an emergency. This will tell any health care providers that you have a port.  Keep all follow-up visits as told by your health care provider. This is important. Contact a health care provider if:  You cannot flush your port with saline as directed, or you cannot draw blood from the port.  You have a fever or chills.  You have redness, swelling, or pain around your port insertion site.  You have fluid or blood coming from your port insertion site.  Your port insertion site feels warm to the touch.  You have pus or a bad smell coming from the port insertion site. Get help right away if:  You have chest pain or shortness of breath.  You have bleeding from your port that you cannot control. Summary  Take care of the port as told by your   health care provider. Keep the manufacturer's information card with you at all times.  Change your dressing as told by your health care provider.  Contact a health care provider if you have a fever or chills or if you have redness, swelling, or pain around your port insertion site.  Keep all follow-up visits as told by your health care provider. This information is not intended to replace advice given to you by your health care provider. Make sure you discuss any questions you have with your health care provider. Document Revised: 09/08/2017 Document Reviewed: 09/08/2017 Elsevier Patient Education   2021 [employer]  

## 2020-05-17 NOTE — Progress Notes (Signed)
Dr. Julien Nordmann has reviewed today's labs to include platelets 47 and creatine 2.60 and wishes to proceed with reduced dose treatment today.

## 2020-05-17 NOTE — Progress Notes (Signed)
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  PRIOR THERAPY: 1) Systemic therapy with Velcade 1.3 mg/M2 weekly, Revlimid 25 mg p.o. daily for 14 days every 3 weeks in addition to weekly Decadron 40 mg orally. First dose of treatment 10/08/2017.  Treatment was placed on hold due to development of a significant rash.  She resumed treatment with only dexamethasone and Velcade on 11/19/2017. This was discontinued on 04/14/2019 due to evidence of disease progression.  2) Pomalyst 4 mg p.o. for 21 days every 4 weeks and 40 mg p.o. Decadron once a week. First dose 04/29/2019-05/04/2019. Status post 6 days of treatment. Discontinued secondary to allergy to Pomalyst.  3) Chemotherapy with carfilzomib on days 1, 2, 8, 9, 15, and 16 and Cytoxan 300 mg/m2 on days 1, 8, and 15 IV every 4 weeks and 40 mg p.o. weekly of Decadron. Last dose expected on 03/02/2020. Status post 11 cycles. Discontinued due to intolerance  CURRENT THERAPY: Chemotherapy with Daratumumab on days 1, 8, 15, and 22 IV every 4 weeks and 20 mg p.o. weekly of Decadron. First dose expected on 05/17/2020.   INTERVAL HISTORY: Debbie Chan 70 y.o. female returns to the clinic today for follow-up visit.  Her son was available by phone during the visit.  The patient is feeling fine today with no concerning complaints except for the fatigue and the weakness in the lower extremities with swelling.  She denied having any current chest pain but has mild shortness of breath with exertion with no cough or hemoptysis.  She denied having any fever or chills.  She has no nausea, vomiting, diarrhea or constipation.  She has no headache or visual changes.  She has no bleeding, bruises or ecchymosis.  She is here today for evaluation before starting the first cycle of her treatment with  daratumumab and Decadron.   MEDICAL HISTORY: Past Medical History:  Diagnosis Date  . Cancer ([place])     ALLERGIES:  has No Known Allergies.  MEDICATIONS:  Current Outpatient Medications  Medication Sig Dispense Refill  . acetaminophen (TYLENOL) 325 MG tablet Take 2 tablets (650 mg total) by mouth every 6 (six) hours as needed for mild pain (or Fever >/= 101).    Marland Kitchen acyclovir (ZOVIRAX) 200 MG capsule Take 1 capsule (200 mg total) by mouth 2 (two) times daily. 60 capsule 4  . amLODipine (NORVASC) 2.5 MG tablet Take 1 tablet (2.5 mg total) by mouth daily. 30 tablet 3  . blood glucose meter kit and supplies Dispense based on patient and insurance preference. Use up to four times daily as directed. (FOR ICD-10 E10.9, E11.9). 1 each 0  . carvedilol (COREG) 12.5 MG tablet Take 1 tablet (12.5 mg total) by mouth 2 (two) times daily with a meal. 60 tablet 3  . dexamethasone 20 MG TABS Please take 1  tablet once a week on the day of treatment 30 tablet 2  . ferrous sulfate 325 (65 FE) MG tablet Take 1 tablet (325 mg total) by mouth daily with breakfast. 30 tablet 3  . furosemide (LASIX) 40 MG tablet Take 1 tablet (40 mg total) by mouth 2 (two) times daily. 60 tablet 3  . hydrALAZINE (APRESOLINE) 100 MG tablet Take 1 tablet (100 mg total) by mouth every 8 (eight) hours. 90 tablet 3  . isosorbide mononitrate (IMDUR)  30 MG 24 hr tablet Take 1 tablet (30 mg total) by mouth daily. 30 tablet 3  . loratadine (CLARITIN) 10 MG tablet Take 10 mg by mouth daily.    Marland Kitchen MELATONIN PO Take 10 mg by mouth at bedtime as needed (sleep).    Marland Kitchen omeprazole (PRILOSEC) 20 MG capsule Take 1 capsule (20 mg total) by mouth daily. 30 capsule 4  . ReliOn Ultra Thin Lancets 30G MISC USE 1 TO CHECK GLUCOSE ONCE DAILY FOR GLUCOSE TESTING     No current facility-administered medications for this visit.   Facility-Administered Medications Ordered in Other Visits  Medication Dose Route Frequency Provider Last Rate Last Admin  .  sodium chloride flush (NS) 0.9 % injection 10 mL  10 mL Intracatheter PRN Curt Bears, MD   10 mL at 02/22/20 1127    SURGICAL HISTORY:  Past Surgical History:  Procedure Laterality Date  . IR IMAGING GUIDED PORT INSERTION  05/10/2019    REVIEW OF SYSTEMS:  Constitutional: positive for fatigue Eyes: negative Ears, nose, mouth, throat, and face: negative Respiratory: positive for dyspnea on exertion Cardiovascular: negative Gastrointestinal: negative Genitourinary:negative Integument/breast: negative Hematologic/lymphatic: negative Musculoskeletal:positive for arthralgias and muscle weakness Neurological: positive for paresthesia Behavioral/Psych: negative Endocrine: negative Allergic/Immunologic: negative   PHYSICAL EXAMINATION: General appearance: alert, cooperative, fatigued and no distress Head: Normocephalic, without obvious abnormality, atraumatic Neck: no adenopathy, no JVD, supple, symmetrical, trachea midline and thyroid not enlarged, symmetric, no tenderness/mass/nodules Lymph nodes: Cervical, supraclavicular, and axillary nodes normal. Resp: clear to auscultation bilaterally Back: symmetric, no curvature. ROM normal. No CVA tenderness. Cardio: regular rate and rhythm, S1, S2 normal, no murmur, click, rub or gallop GI: soft, non-tender; bowel sounds normal; no masses,  no organomegaly Extremities: extremities normal, atraumatic, no cyanosis or edema Neurologic: Alert and oriented X 3, normal strength and tone. Normal symmetric reflexes. Normal coordination and gait  ECOG PERFORMANCE STATUS: 1 - Symptomatic but completely ambulatory  There were no vitals taken for this visit.  LABORATORY DATA: Lab Results  Component Value Date   WBC 3.3 (L) 05/17/2020   HGB 8.7 (L) 05/17/2020   HCT 25.3 (L) 05/17/2020   MCV 90.4 05/17/2020   PLT 47 (L) 05/17/2020      Chemistry      Component Value Date/Time   NA 133 (L) 05/17/2020 0743   NA 132 (L) 04/17/2020 1612    K 4.3 05/17/2020 0743   CL 100 05/17/2020 0743   CO2 22 05/17/2020 0743   BUN 28 (H) 05/17/2020 0743   BUN 46 (H) 04/17/2020 1612   CREATININE 2.60 (H) 05/17/2020 0743      Component Value Date/Time   CALCIUM 8.3 (L) 05/17/2020 0743   ALKPHOS 69 05/17/2020 0743   AST 15 05/17/2020 0743   ALT 10 05/17/2020 0743   BILITOT 0.4 05/17/2020 0743       RADIOGRAPHIC STUDIES: No results found.  ASSESSMENT AND PLAN: This is a very pleasant 70 years old African female originally from [place] who is visiting her son in [place] and was recently diagnosed with IgA multiple myeloma. The patient was a started initially on treatment with weekly subcutaneous Velcade 1.3 mg/M2, Revlimid 25 mg p.o. daily for 21 days every 4 weeks as well as weekly Decadron 40 mg orally.  Revlimid was discontinued secondary to hypersensitivity reaction with significant skin rash.  She is currently on treatment with weekly Velcade and Decadron. Her treatment was discontinued secondary to disease progression.  The patient started treatment with Pomalyst  and Decadron but this was discontinued secondary to hypersensitivity reaction to the Pomalyst. She underwent systemic chemotherapy with carfilzomib, Cytoxan and Decadron status post 11 cycles. The patient tolerated the previous treatment well but this was discontinued secondary to disease progression as well as recent hospitalization with COVID-19 infection and pneumonia.  She is recovering well but she has not received any treatment for the last 6 weeks or more. She was supposed to start treatment with daratumumab and Decadron last week but her platelets count continues to be low. She is here for evaluation and consideration of starting her treatment.  She continues to have low platelets count of 47,000.  I had a lengthy discussion with the patient and her son today about her condition and whether to proceed with the treatment or hold it a little bit further.   They would like to push ahead and receive the treatment today.  I will reduce the dose of daratumumab first week to 10 mg/KG and next week to 12 mg/KG before starting the normal dose on week #3 if she continues to have improvement in her platelets count. Her thrombocytopenia could be secondary to her disease and it will be wise to proceed with the treatment and have close monitoring of her CBC. The patient and her son agreed to the current plan. She will come back for follow-up visit in 3 weeks for evaluation before starting cycle #2. The patient was advised to call immediately if she has any other concerning symptoms in the interval. The patient voices understanding of current disease status and treatment options and is in agreement with the current care plan.  All questions were answered. The patient knows to call the clinic with any problems, questions or concerns. We can certainly see the patient much sooner if necessary.  The total time spent in the appointment was 30 minutes.  Disclaimer: This note was dictated with voice recognition software. Similar sounding words can inadvertently be transcribed and may not be corrected upon review.

## 2020-05-19 MED FILL — OMEPRAZOLE 20 MG CAP: 20 | 30 days supply | Qty: 30 | Fill #0

## 2020-05-21 MED FILL — OMEPRAZOLE 20 MG CAP: 20 | 30 days supply | Qty: 30 | Fill #0

## 2020-05-24 ENCOUNTER — Inpatient Hospital Stay: Payer: Self-pay

## 2020-05-24 ENCOUNTER — Other Ambulatory Visit: Payer: Self-pay

## 2020-05-24 VITALS — BP 138/63 | HR 90 | Temp 97.7°F | Resp 16

## 2020-05-24 DIAGNOSIS — C9000 Multiple myeloma not having achieved remission: Secondary | ICD-10-CM

## 2020-05-24 DIAGNOSIS — Z95828 Presence of other vascular implants and grafts: Secondary | ICD-10-CM

## 2020-05-24 LAB — CBC WITH DIFFERENTIAL (CANCER CENTER ONLY)
Abs Immature Granulocytes: 0.01 10*3/uL (ref 0.00–0.07)
Basophils Absolute: 0 10*3/uL (ref 0.0–0.1)
Basophils Relative: 0 %
Eosinophils Absolute: 0.2 10*3/uL (ref 0.0–0.5)
Eosinophils Relative: 8 %
HCT: 25.3 % — ABNORMAL LOW (ref 36.0–46.0)
Hemoglobin: 8.9 g/dL — ABNORMAL LOW (ref 12.0–15.0)
Immature Granulocytes: 0 %
Lymphocytes Relative: 15 %
Lymphs Abs: 0.5 10*3/uL — ABNORMAL LOW (ref 0.7–4.0)
MCH: 31.7 pg (ref 26.0–34.0)
MCHC: 35.2 g/dL (ref 30.0–36.0)
MCV: 90 fL (ref 80.0–100.0)
Monocytes Absolute: 0.4 10*3/uL (ref 0.1–1.0)
Monocytes Relative: 12 %
Neutro Abs: 2 10*3/uL (ref 1.7–7.7)
Neutrophils Relative %: 65 %
Platelet Count: 40 10*3/uL — ABNORMAL LOW (ref 150–400)
RBC: 2.81 MIL/uL — ABNORMAL LOW (ref 3.87–5.11)
RDW: 13.7 % (ref 11.5–15.5)
WBC Count: 3 10*3/uL — ABNORMAL LOW (ref 4.0–10.5)
nRBC: 0 % (ref 0.0–0.2)

## 2020-05-24 LAB — CMP (CANCER CENTER ONLY)
ALT: 8 U/L (ref 0–44)
AST: 12 U/L — ABNORMAL LOW (ref 15–41)
Albumin: 3.1 g/dL — ABNORMAL LOW (ref 3.5–5.0)
Alkaline Phosphatase: 58 U/L (ref 38–126)
Anion gap: 11 (ref 5–15)
BUN: 37 mg/dL — ABNORMAL HIGH (ref 8–23)
CO2: 24 mmol/L (ref 22–32)
Calcium: 8.1 mg/dL — ABNORMAL LOW (ref 8.9–10.3)
Chloride: 104 mmol/L (ref 98–111)
Creatinine: 2.31 mg/dL — ABNORMAL HIGH (ref 0.44–1.00)
GFR, Estimated: 22 mL/min — ABNORMAL LOW (ref >60)
Glucose, Bld: 91 mg/dL (ref 70–99)
Potassium: 3.6 mmol/L (ref 3.5–5.1)
Sodium: 139 mmol/L (ref 135–145)
Total Bilirubin: 0.3 mg/dL (ref 0.3–1.2)
Total Protein: 5.5 g/dL — ABNORMAL LOW (ref 6.5–8.1)

## 2020-05-24 MED ORDER — MONTELUKAST SODIUM 10 MG PO TABS
10.0000 mg | ORAL_TABLET | Freq: Once | ORAL | Status: AC
  Administered 2020-05-24: 10 mg via ORAL

## 2020-05-24 MED ORDER — DIPHENHYDRAMINE HCL 25 MG PO CAPS
50.0000 mg | ORAL_CAPSULE | Freq: Once | ORAL | Status: AC
Start: 2020-05-24 — End: 2020-05-24
  Administered 2020-05-24: 50 mg via ORAL

## 2020-05-24 MED ORDER — DIPHENHYDRAMINE HCL 25 MG PO CAPS
ORAL_CAPSULE | ORAL | Status: AC
  Filled 2020-05-24: qty 2

## 2020-05-24 MED ORDER — METHYLPREDNISOLONE SODIUM SUCC 125 MG IJ SOLR
100.0000 mg | Freq: Once | INTRAMUSCULAR | Status: AC
  Administered 2020-05-24: 100 mg via INTRAVENOUS

## 2020-05-24 MED ORDER — SODIUM CHLORIDE 0.9 % IV SOLN
10.0000 mg/kg | Freq: Once | INTRAVENOUS | Status: AC
  Administered 2020-05-24: 900 mg via INTRAVENOUS
  Filled 2020-05-24: qty 40

## 2020-05-24 MED ORDER — ACETAMINOPHEN 325 MG PO TABS
ORAL_TABLET | ORAL | Status: AC
  Filled 2020-05-24: qty 2

## 2020-05-24 MED ORDER — HEPARIN SOD (PORK) LOCK FLUSH 100 UNIT/ML IV SOLN
500.0000 [IU] | Freq: Once | INTRAVENOUS | Status: AC | PRN
  Administered 2020-05-24: 500 [IU]
  Filled 2020-05-24: qty 5

## 2020-05-24 MED ORDER — SODIUM CHLORIDE 0.9 % IV SOLN
Freq: Once | INTRAVENOUS | Status: AC
  Filled 2020-05-24: qty 250

## 2020-05-24 MED ORDER — SODIUM CHLORIDE 0.9% FLUSH
10.0000 mL | INTRAVENOUS | Status: DC | PRN
  Administered 2020-05-24: 10 mL
  Filled 2020-05-24: qty 10

## 2020-05-24 MED ORDER — ACETAMINOPHEN 325 MG PO TABS
650.0000 mg | ORAL_TABLET | Freq: Once | ORAL | Status: AC
  Administered 2020-05-24: 650 mg via ORAL

## 2020-05-24 MED ORDER — MONTELUKAST SODIUM 10 MG PO TABS
ORAL_TABLET | ORAL | Status: AC
  Filled 2020-05-24: qty 1

## 2020-05-24 MED ORDER — METHYLPREDNISOLONE SODIUM SUCC 125 MG IJ SOLR
INTRAMUSCULAR | Status: AC
  Filled 2020-05-24: qty 2

## 2020-05-24 NOTE — Progress Notes (Signed)
Per Cassie Heilingoetter PA-C, ok to treat with low platelets. Cassie states she discussed this with Dr. Julien Nordmann who advised to proceed with treatment today.

## 2020-05-24 NOTE — Progress Notes (Signed)
Will keep dara at 10mg /kg today.

## 2020-05-24 NOTE — Patient Instructions (Signed)
[facility] Discharge Instructions for Patients Receiving Chemotherapy  Today you received the following chemotherapy agents: daratumumab.  To help prevent nausea and vomiting after your treatment, we encourage you to take your nausea medication as directed.   If you develop nausea and vomiting that is not controlled by your nausea medication, call the clinic.   BELOW ARE SYMPTOMS THAT SHOULD BE REPORTED IMMEDIATELY:  *FEVER GREATER THAN 100.5 F  *CHILLS WITH OR WITHOUT FEVER  NAUSEA AND VOMITING THAT IS NOT CONTROLLED WITH YOUR NAUSEA MEDICATION  *UNUSUAL SHORTNESS OF BREATH  *UNUSUAL BRUISING OR BLEEDING  TENDERNESS IN MOUTH AND THROAT WITH OR WITHOUT PRESENCE OF ULCERS  *URINARY PROBLEMS  *BOWEL PROBLEMS  UNUSUAL RASH Items with * indicate a potential emergency and should be followed up as soon as possible.  Feel free to call the clinic should you have any questions or concerns. The clinic phone number is [phone].  Please show the [facility] at check-in to the Emergency Department and triage nurse.

## 2020-05-29 ENCOUNTER — Telehealth: Payer: Self-pay | Admitting: Internal Medicine

## 2020-05-29 NOTE — Telephone Encounter (Signed)
Scheduled per los and scheduled, patient has been called and notified of upcoming appointments.

## 2020-05-30 ENCOUNTER — Other Ambulatory Visit (HOSPITAL_COMMUNITY): Payer: Self-pay

## 2020-05-30 ENCOUNTER — Other Ambulatory Visit: Payer: Self-pay

## 2020-05-30 ENCOUNTER — Inpatient Hospital Stay: Payer: Self-pay

## 2020-05-30 ENCOUNTER — Inpatient Hospital Stay: Payer: Self-pay | Attending: Internal Medicine

## 2020-05-30 VITALS — BP 151/90 | HR 72 | Temp 97.8°F | Resp 16

## 2020-05-30 DIAGNOSIS — Z79899 Other long term (current) drug therapy: Secondary | ICD-10-CM | POA: Insufficient documentation

## 2020-05-30 DIAGNOSIS — Z5111 Encounter for antineoplastic chemotherapy: Secondary | ICD-10-CM | POA: Insufficient documentation

## 2020-05-30 DIAGNOSIS — C9000 Multiple myeloma not having achieved remission: Secondary | ICD-10-CM | POA: Insufficient documentation

## 2020-05-30 DIAGNOSIS — R059 Cough, unspecified: Secondary | ICD-10-CM | POA: Insufficient documentation

## 2020-05-30 DIAGNOSIS — R21 Rash and other nonspecific skin eruption: Secondary | ICD-10-CM | POA: Insufficient documentation

## 2020-05-30 DIAGNOSIS — Z95828 Presence of other vascular implants and grafts: Secondary | ICD-10-CM

## 2020-05-30 DIAGNOSIS — R5383 Other fatigue: Secondary | ICD-10-CM | POA: Insufficient documentation

## 2020-05-30 LAB — CMP (CANCER CENTER ONLY)
ALT: 10 U/L (ref 0–44)
AST: 12 U/L — ABNORMAL LOW (ref 15–41)
Albumin: 3.4 g/dL — ABNORMAL LOW (ref 3.5–5.0)
Alkaline Phosphatase: 63 U/L (ref 38–126)
Anion gap: 11 (ref 5–15)
BUN: 35 mg/dL — ABNORMAL HIGH (ref 8–23)
CO2: 24 mmol/L (ref 22–32)
Calcium: 8.3 mg/dL — ABNORMAL LOW (ref 8.9–10.3)
Chloride: 103 mmol/L (ref 98–111)
Creatinine: 2.27 mg/dL — ABNORMAL HIGH (ref 0.44–1.00)
GFR, Estimated: 23 mL/min — ABNORMAL LOW (ref >60)
Glucose, Bld: 93 mg/dL (ref 70–99)
Potassium: 3.6 mmol/L (ref 3.5–5.1)
Sodium: 138 mmol/L (ref 135–145)
Total Bilirubin: 0.4 mg/dL (ref 0.3–1.2)
Total Protein: 5.7 g/dL — ABNORMAL LOW (ref 6.5–8.1)

## 2020-05-30 LAB — CBC WITH DIFFERENTIAL (CANCER CENTER ONLY)
Abs Immature Granulocytes: 0.01 10*3/uL (ref 0.00–0.07)
Basophils Absolute: 0 10*3/uL (ref 0.0–0.1)
Basophils Relative: 0 %
Eosinophils Absolute: 0.1 10*3/uL (ref 0.0–0.5)
Eosinophils Relative: 2 %
HCT: 27.2 % — ABNORMAL LOW (ref 36.0–46.0)
Hemoglobin: 9.3 g/dL — ABNORMAL LOW (ref 12.0–15.0)
Immature Granulocytes: 0 %
Lymphocytes Relative: 19 %
Lymphs Abs: 0.6 10*3/uL — ABNORMAL LOW (ref 0.7–4.0)
MCH: 30.5 pg (ref 26.0–34.0)
MCHC: 34.2 g/dL (ref 30.0–36.0)
MCV: 89.2 fL (ref 80.0–100.0)
Monocytes Absolute: 0.4 10*3/uL (ref 0.1–1.0)
Monocytes Relative: 13 %
Neutro Abs: 1.9 10*3/uL (ref 1.7–7.7)
Neutrophils Relative %: 66 %
Platelet Count: 36 10*3/uL — ABNORMAL LOW (ref 150–400)
RBC: 3.05 MIL/uL — ABNORMAL LOW (ref 3.87–5.11)
RDW: 13.9 % (ref 11.5–15.5)
WBC Count: 3 10*3/uL — ABNORMAL LOW (ref 4.0–10.5)
nRBC: 0 % (ref 0.0–0.2)

## 2020-05-30 MED ORDER — SODIUM CHLORIDE 0.9% FLUSH
10.0000 mL | INTRAVENOUS | Status: DC | PRN
  Administered 2020-05-30: 10 mL
  Filled 2020-05-30: qty 10

## 2020-05-30 MED ORDER — LIDOCAINE-PRILOCAINE 2.5-2.5 % EX CREA
TOPICAL_CREAM | CUTANEOUS | 1 refills | Status: AC
  Filled 2020-05-30: qty 30, 30d supply, fill #0
  Filled 2020-06-28: qty 30, 30d supply, fill #1

## 2020-05-30 MED ORDER — HEPARIN SOD (PORK) LOCK FLUSH 100 UNIT/ML IV SOLN
500.0000 [IU] | Freq: Once | INTRAVENOUS | Status: AC | PRN
  Administered 2020-05-30: 500 [IU]
  Filled 2020-05-30: qty 5

## 2020-05-30 MED FILL — Isosorbide Mononitrate Tab ER 24HR 30 MG: ORAL | 30 days supply | Qty: 30 | Fill #0 | Status: AC

## 2020-05-30 MED FILL — Amlodipine Besylate Tab 2.5 MG (Base Equivalent): ORAL | 30 days supply | Qty: 30 | Fill #0 | Status: AC

## 2020-05-30 MED FILL — Furosemide Tab 40 MG: ORAL | 30 days supply | Qty: 60 | Fill #0 | Status: AC

## 2020-05-30 MED FILL — Ferrous Sulfate Tab 325 MG (65 MG Elemental Fe): ORAL | 30 days supply | Qty: 30 | Fill #0 | Status: AC

## 2020-05-30 MED FILL — Hydralazine HCl Tab 100 MG: ORAL | 30 days supply | Qty: 90 | Fill #0 | Status: AC

## 2020-05-30 MED FILL — Carvedilol Tab 12.5 MG: ORAL | 30 days supply | Qty: 60 | Fill #0 | Status: AC

## 2020-05-30 NOTE — Progress Notes (Signed)
Per Dr. Julien Nordmann, hold treatment today d/t low platelets.

## 2020-05-30 NOTE — Patient Instructions (Signed)

## 2020-05-31 ENCOUNTER — Ambulatory Visit (HOSPITAL_COMMUNITY): Payer: Self-pay

## 2020-05-31 ENCOUNTER — Other Ambulatory Visit (HOSPITAL_COMMUNITY): Payer: Self-pay

## 2020-06-01 ENCOUNTER — Telehealth: Payer: Self-pay
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
## 2020-06-02 ENCOUNTER — Other Ambulatory Visit (HOSPITAL_COMMUNITY): Payer: Self-pay

## 2020-06-07 ENCOUNTER — Encounter: Payer: Self-pay | Admitting: Internal Medicine

## 2020-06-07 ENCOUNTER — Inpatient Hospital Stay: Payer: Self-pay

## 2020-06-07 ENCOUNTER — Inpatient Hospital Stay (HOSPITAL_BASED_OUTPATIENT_CLINIC_OR_DEPARTMENT_OTHER): Payer: Self-pay | Admitting: Internal Medicine

## 2020-06-07 ENCOUNTER — Other Ambulatory Visit: Payer: Self-pay

## 2020-06-07 VITALS — BP 132/58 | HR 83 | Temp 99.1°F | Resp 18 | Ht 68.0 in | Wt 190.6 lb

## 2020-06-07 VITALS — BP 123/57 | HR 88 | Temp 98.3°F | Resp 20

## 2020-06-07 DIAGNOSIS — C9000 Multiple myeloma not having achieved remission: Secondary | ICD-10-CM

## 2020-06-07 DIAGNOSIS — Z5111 Encounter for antineoplastic chemotherapy: Secondary | ICD-10-CM

## 2020-06-07 DIAGNOSIS — D696 Thrombocytopenia, unspecified: Secondary | ICD-10-CM

## 2020-06-07 LAB — CBC WITH DIFFERENTIAL (CANCER CENTER ONLY)
Abs Immature Granulocytes: 0.01 10*3/uL (ref 0.00–0.07)
Basophils Absolute: 0 10*3/uL (ref 0.0–0.1)
Basophils Relative: 0 %
Eosinophils Absolute: 0 10*3/uL (ref 0.0–0.5)
Eosinophils Relative: 0 %
HCT: 27.5 % — ABNORMAL LOW (ref 36.0–46.0)
Hemoglobin: 9.3 g/dL — ABNORMAL LOW (ref 12.0–15.0)
Immature Granulocytes: 0 %
Lymphocytes Relative: 10 %
Lymphs Abs: 0.5 10*3/uL — ABNORMAL LOW (ref 0.7–4.0)
MCH: 29.9 pg (ref 26.0–34.0)
MCHC: 33.8 g/dL (ref 30.0–36.0)
MCV: 88.4 fL (ref 80.0–100.0)
Monocytes Absolute: 0.3 10*3/uL (ref 0.1–1.0)
Monocytes Relative: 6 %
Neutro Abs: 4.3 10*3/uL (ref 1.7–7.7)
Neutrophils Relative %: 84 %
Platelet Count: 38 10*3/uL — ABNORMAL LOW (ref 150–400)
RBC: 3.11 MIL/uL — ABNORMAL LOW (ref 3.87–5.11)
RDW: 13.4 % (ref 11.5–15.5)
WBC Count: 5.1 10*3/uL (ref 4.0–10.5)
WBC Morphology: INCREASED
nRBC: 0 % (ref 0.0–0.2)

## 2020-06-07 LAB — CMP (CANCER CENTER ONLY)
ALT: 8 U/L (ref 0–44)
AST: 16 U/L (ref 15–41)
Albumin: 3.3 g/dL — ABNORMAL LOW (ref 3.5–5.0)
Alkaline Phosphatase: 67 U/L (ref 38–126)
Anion gap: 12 (ref 5–15)
BUN: 34 mg/dL — ABNORMAL HIGH (ref 8–23)
CO2: 21 mmol/L — ABNORMAL LOW (ref 22–32)
Calcium: 8.5 mg/dL — ABNORMAL LOW (ref 8.9–10.3)
Chloride: 100 mmol/L (ref 98–111)
Creatinine: 2.38 mg/dL — ABNORMAL HIGH (ref 0.44–1.00)
GFR, Estimated: 21 mL/min — ABNORMAL LOW (ref >60)
Glucose, Bld: 206 mg/dL — ABNORMAL HIGH (ref 70–99)
Potassium: 3.6 mmol/L (ref 3.5–5.1)
Sodium: 133 mmol/L — ABNORMAL LOW (ref 135–145)
Total Bilirubin: 0.6 mg/dL (ref 0.3–1.2)
Total Protein: 5.6 g/dL — ABNORMAL LOW (ref 6.5–8.1)

## 2020-06-07 MED ORDER — MONTELUKAST SODIUM 10 MG PO TABS
ORAL_TABLET | ORAL | Status: AC
  Filled 2020-06-07: qty 1

## 2020-06-07 MED ORDER — METHYLPREDNISOLONE SODIUM SUCC 125 MG IJ SOLR
INTRAMUSCULAR | Status: AC
  Filled 2020-06-07: qty 2

## 2020-06-07 MED ORDER — METHYLPREDNISOLONE SODIUM SUCC 125 MG IJ SOLR
100.0000 mg | Freq: Once | INTRAMUSCULAR | Status: AC
  Administered 2020-06-07: 100 mg via INTRAVENOUS

## 2020-06-07 MED ORDER — ACETAMINOPHEN 325 MG PO TABS
ORAL_TABLET | ORAL | Status: AC
  Filled 2020-06-07: qty 2

## 2020-06-07 MED ORDER — ACETAMINOPHEN 325 MG PO TABS
650.0000 mg | ORAL_TABLET | Freq: Once | ORAL | Status: AC
  Administered 2020-06-07: 650 mg via ORAL

## 2020-06-07 MED ORDER — SODIUM CHLORIDE 0.9% FLUSH
10.0000 mL | INTRAVENOUS | Status: DC | PRN
  Administered 2020-06-07: 10 mL
  Filled 2020-06-07: qty 10

## 2020-06-07 MED ORDER — HEPARIN SOD (PORK) LOCK FLUSH 100 UNIT/ML IV SOLN
500.0000 [IU] | Freq: Once | INTRAVENOUS | Status: AC | PRN
  Administered 2020-06-07: 500 [IU]
  Filled 2020-06-07: qty 5

## 2020-06-07 MED ORDER — SODIUM CHLORIDE 0.9 % IV SOLN
16.0000 mg/kg | Freq: Once | INTRAVENOUS | Status: AC
  Administered 2020-06-07: 1400 mg via INTRAVENOUS
  Filled 2020-06-07: qty 60

## 2020-06-07 MED ORDER — SODIUM CHLORIDE 0.9 % IV SOLN
Freq: Once | INTRAVENOUS | Status: AC
  Filled 2020-06-07: qty 250

## 2020-06-07 MED ORDER — MONTELUKAST SODIUM 10 MG PO TABS
10.0000 mg | ORAL_TABLET | Freq: Once | ORAL | Status: AC
  Administered 2020-06-07: 10 mg via ORAL

## 2020-06-07 MED ORDER — DIPHENHYDRAMINE HCL 25 MG PO CAPS
ORAL_CAPSULE | ORAL | Status: AC
  Filled 2020-06-07: qty 2

## 2020-06-07 MED ORDER — DIPHENHYDRAMINE HCL 25 MG PO CAPS
50.0000 mg | ORAL_CAPSULE | Freq: Once | ORAL | Status: AC
  Administered 2020-06-07: 50 mg via ORAL

## 2020-06-07 NOTE — Progress Notes (Signed)
OK to treat per Dr. Mckinley Jewel with platelets at 25. Kirt Boys, RN

## 2020-06-07 NOTE — Patient Instructions (Signed)
[facility] Discharge Instructions for Patients Receiving Chemotherapy  Today you received the following chemotherapy agent: Daratumumab (Darzalex)  To help prevent nausea and vomiting after your treatment, we encourage you to take your nausea medication as directed by your MD.   If you develop nausea and vomiting that is not controlled by your nausea medication, call the clinic.   BELOW ARE SYMPTOMS THAT SHOULD BE REPORTED IMMEDIATELY:  *FEVER GREATER THAN 100.5 F  *CHILLS WITH OR WITHOUT FEVER  NAUSEA AND VOMITING THAT IS NOT CONTROLLED WITH YOUR NAUSEA MEDICATION  *UNUSUAL SHORTNESS OF BREATH  *UNUSUAL BRUISING OR BLEEDING  TENDERNESS IN MOUTH AND THROAT WITH OR WITHOUT PRESENCE OF ULCERS  *URINARY PROBLEMS  *BOWEL PROBLEMS  UNUSUAL RASH Items with * indicate a potential emergency and should be followed up as soon as possible.  Feel free to call the clinic should you have any questions or concerns. The clinic phone number is [phone].  Please show the [facility] at check-in to the Emergency Department and triage nurse.

## 2020-06-07 NOTE — Progress Notes (Signed)
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  PRIOR THERAPY: 1) Systemic therapy with Velcade 1.3 mg/M2 weekly, Revlimid 25 mg p.o. daily for 14 days every 3 weeks in addition to weekly Decadron 40 mg orally. First dose of treatment 10/08/2017.  Treatment was placed on hold due to development of a significant rash.  She resumed treatment with only dexamethasone and Velcade on 11/19/2017. This was discontinued on 04/14/2019 due to evidence of disease progression.  2) Pomalyst 4 mg p.o. for 21 days every 4 weeks and 40 mg p.o. Decadron once a week. First dose 04/29/2019-05/04/2019. Status post 6 days of treatment. Discontinued secondary to allergy to Pomalyst.  3) Chemotherapy with carfilzomib on days 1, 2, 8, 9, 15, and 16 and Cytoxan 300 mg/m2 on days 1, 8, and 15 IV every 4 weeks and 40 mg p.o. weekly of Decadron. Last dose expected on 03/02/2020. Status post 11 cycles. Discontinued due to intolerance  CURRENT THERAPY: Chemotherapy with Daratumumab on days 1, 8, 15, and 22 IV every 4 weeks and 20 mg p.o. weekly of Decadron. First dose expected on 05/17/2020.  Currently undergoing treatment with cycle #1.  INTERVAL HISTORY: Debbie Chan 70 y.o. female returns to the clinic today for follow-up visit.  The patient is feeling fine today with no concerning complaints except for mild fatigue and feeling sleepy.  She lost several pounds since her last visit.  Her cough is much better.  She denied having any current nausea, vomiting, diarrhea or constipation.  She has no headache or visual changes.  She has no chest pain, shortness of breath or hemoptysis.  She is here today for evaluation before her dose 15 of cycle #1 of the chemotherapy.   MEDICAL HISTORY: Past Medical History:  Diagnosis Date  . Cancer ([place])     ALLERGIES:   has No Known Allergies.  MEDICATIONS:  Current Outpatient Medications  Medication Sig Dispense Refill  . acetaminophen (TYLENOL) 325 MG tablet Take 2 tablets (650 mg total) by mouth every 6 (six) hours as needed for mild pain (or Fever >/= 101).    Marland Kitchen acyclovir (ZOVIRAX) 200 MG capsule TAKE 1 CAPSULE BY MOUTH TWICE DAILY 60 capsule 4  . amLODipine (NORVASC) 2.5 MG tablet TAKE 1 TABLET BY MOUTH ONCE A DAY 30 tablet 3  . blood glucose meter kit and supplies Dispense based on patient and insurance preference. Use up to four times daily as directed. (FOR ICD-10 E10.9, E11.9). 1 each 0  . carvedilol (COREG) 12.5 MG tablet TAKE 1 TABLET BY MOUTH 2 TIMES DAILY WITH A MEAL 60 tablet 3  . dexamethasone 20 MG TABS Please take 1  tablet once a week on the day of treatment 30 tablet 2  . ferrous sulfate 325 (65 FE) MG tablet TAKE 1 TABLET BY MOUTH ONCE A DAY WITH BREAKFAST 30 tablet 3  . furosemide (LASIX) 40 MG tablet TAKE 1 TABLET BY MOUTH 2 TIMES DAILY 60 tablet 3  . hydrALAZINE (APRESOLINE) 100 MG tablet TAKE 1 TABLET BY MOUTH EVERY 8 HOURS 90 tablet 3  . isosorbide mononitrate (IMDUR) 30 MG 24 hr tablet TAKE 1 TABLET BY MOUTH ONCE A DAY 30 tablet 3  . lidocaine-prilocaine (EMLA) cream Apply topically as needed 30 g 1  . loratadine (CLARITIN) 10 MG tablet Take 10 mg by mouth  daily.    Marland Kitchen MELATONIN PO Take 10 mg by mouth at bedtime as needed (sleep).    Marland Kitchen omeprazole (PRILOSEC) 20 MG capsule TAKE 1 CAPSULE BY MOUTH ONCE DAILY 30 capsule 4  . omeprazole (PRILOSEC) 20 MG capsule TAKE 1 CAPSULE BY MOUTH ONCE DAILY 30 capsule 3  . ReliOn Ultra Thin Lancets 30G MISC USE 1 TO CHECK GLUCOSE ONCE DAILY FOR GLUCOSE TESTING     No current facility-administered medications for this visit.   Facility-Administered Medications Ordered in Other Visits  Medication Dose Route Frequency Provider Last Rate Last Admin  . sodium chloride flush (NS) 0.9 % injection 10 mL  10 mL Intracatheter PRN Curt Bears, MD   10  mL at 02/22/20 1127    SURGICAL HISTORY:  Past Surgical History:  Procedure Laterality Date  . IR IMAGING GUIDED PORT INSERTION  05/10/2019    REVIEW OF SYSTEMS:  A comprehensive review of systems was negative except for: Constitutional: positive for fatigue Respiratory: positive for cough   PHYSICAL EXAMINATION: General appearance: alert, cooperative, fatigued and no distress Head: Normocephalic, without obvious abnormality, atraumatic Neck: no adenopathy, no JVD, supple, symmetrical, trachea midline and thyroid not enlarged, symmetric, no tenderness/mass/nodules Lymph nodes: Cervical, supraclavicular, and axillary nodes normal. Resp: clear to auscultation bilaterally Back: symmetric, no curvature. ROM normal. No CVA tenderness. Cardio: regular rate and rhythm, S1, S2 normal, no murmur, click, rub or gallop GI: soft, non-tender; bowel sounds normal; no masses,  no organomegaly Extremities: extremities normal, atraumatic, no cyanosis or edema  ECOG PERFORMANCE STATUS: 1 - Symptomatic but completely ambulatory  Blood pressure (!) 132/58, pulse 83, temperature 99.1 F (37.3 C), temperature source Tympanic, resp. rate 18, height '5\' 8"'  (1.727 m), weight 190 lb 9.6 oz (86.5 kg), SpO2 97 %.  LABORATORY DATA: Lab Results  Component Value Date   WBC 5.1 06/07/2020   HGB 9.3 (L) 06/07/2020   HCT 27.5 (L) 06/07/2020   MCV 88.4 06/07/2020   PLT 38 (L) 06/07/2020      Chemistry      Component Value Date/Time   NA 138 05/30/2020 1055   NA 132 (L) 04/17/2020 1612   K 3.6 05/30/2020 1055   CL 103 05/30/2020 1055   CO2 24 05/30/2020 1055   BUN 35 (H) 05/30/2020 1055   BUN 46 (H) 04/17/2020 1612   CREATININE 2.27 (H) 05/30/2020 1055      Component Value Date/Time   CALCIUM 8.3 (L) 05/30/2020 1055   ALKPHOS 63 05/30/2020 1055   AST 12 (L) 05/30/2020 1055   ALT 10 05/30/2020 1055   BILITOT 0.4 05/30/2020 1055       RADIOGRAPHIC STUDIES: No results found.  ASSESSMENT AND  PLAN: This is a very pleasant 70 years old African female originally from [place] who is visiting her son in [place] and was recently diagnosed with IgA multiple myeloma. The patient was a started initially on treatment with weekly subcutaneous Velcade 1.3 mg/M2, Revlimid 25 mg p.o. daily for 21 days every 4 weeks as well as weekly Decadron 40 mg orally.  Revlimid was discontinued secondary to hypersensitivity reaction with significant skin rash.  She is currently on treatment with weekly Velcade and Decadron. Her treatment was discontinued secondary to disease progression.  The patient started treatment with Pomalyst and Decadron but this was discontinued secondary to hypersensitivity reaction to the Pomalyst. She underwent systemic chemotherapy with carfilzomib, Cytoxan and Decadron status post 11 cycles. The patient tolerated the previous treatment well but this  was discontinued secondary to disease progression as well as recent hospitalization with COVID-19 infection and pneumonia.  She is recovering well but she has not received any treatment for the last 6 weeks or more. She started treatment with daratumumab and Decadron status post 2 doses.  She is tolerating the treatment well but the patient continues to have the persistent thrombocytopenia but she is currently asymptomatic with no bleeding, bruises or ecchymosis.  This is likely secondary to her disease. Will proceed with day 15 of cycle #1 today as planned.  The patient will come back for follow-up visit in 3 weeks for evaluation. She was advised to call immediately if she has any concerning symptoms in the interval  The patient voices understanding of current disease status and treatment options and is in agreement with the current care plan.  All questions were answered. The patient knows to call the clinic with any problems, questions or concerns. We can certainly see the patient much sooner if necessary.   Disclaimer:  This note was dictated with voice recognition software. Similar sounding words can inadvertently be transcribed and may not be corrected upon review.

## 2020-06-14 ENCOUNTER — Other Ambulatory Visit: Payer: Self-pay | Admitting: Medical

## 2020-06-14 ENCOUNTER — Inpatient Hospital Stay: Payer: Self-pay

## 2020-06-14 ENCOUNTER — Other Ambulatory Visit: Payer: Self-pay

## 2020-06-14 ENCOUNTER — Other Ambulatory Visit: Payer: Self-pay | Admitting: Physician Assistant

## 2020-06-14 ENCOUNTER — Other Ambulatory Visit (HOSPITAL_COMMUNITY): Payer: Self-pay

## 2020-06-14 VITALS — BP 122/75 | HR 115 | Temp 98.3°F | Resp 17 | Wt 182.5 lb

## 2020-06-14 DIAGNOSIS — Z95828 Presence of other vascular implants and grafts: Secondary | ICD-10-CM

## 2020-06-14 DIAGNOSIS — C9000 Multiple myeloma not having achieved remission: Secondary | ICD-10-CM

## 2020-06-14 DIAGNOSIS — K219 Gastro-esophageal reflux disease without esophagitis: Secondary | ICD-10-CM

## 2020-06-14 LAB — CMP (CANCER CENTER ONLY)
ALT: <6 U/L (ref 0–44)
AST: 15 U/L (ref 15–41)
Albumin: 3.2 g/dL — ABNORMAL LOW (ref 3.5–5.0)
Alkaline Phosphatase: 65 U/L (ref 38–126)
Anion gap: 9 (ref 5–15)
BUN: 27 mg/dL — ABNORMAL HIGH (ref 8–23)
CO2: 26 mmol/L (ref 22–32)
Calcium: 8.4 mg/dL — ABNORMAL LOW (ref 8.9–10.3)
Chloride: 100 mmol/L (ref 98–111)
Creatinine: 1.86 mg/dL — ABNORMAL HIGH (ref 0.44–1.00)
GFR, Estimated: 29 mL/min — ABNORMAL LOW (ref >60)
Glucose, Bld: 98 mg/dL (ref 70–99)
Potassium: 3.8 mmol/L (ref 3.5–5.1)
Sodium: 135 mmol/L (ref 135–145)
Total Bilirubin: 0.3 mg/dL (ref 0.3–1.2)
Total Protein: 5.7 g/dL — ABNORMAL LOW (ref 6.5–8.1)

## 2020-06-14 LAB — CBC WITH DIFFERENTIAL (CANCER CENTER ONLY)
Abs Immature Granulocytes: 0.02 10*3/uL (ref 0.00–0.07)
Basophils Absolute: 0 10*3/uL (ref 0.0–0.1)
Basophils Relative: 1 %
Eosinophils Absolute: 0.1 10*3/uL (ref 0.0–0.5)
Eosinophils Relative: 2 %
HCT: 28.2 % — ABNORMAL LOW (ref 36.0–46.0)
Hemoglobin: 9.6 g/dL — ABNORMAL LOW (ref 12.0–15.0)
Immature Granulocytes: 1 %
Lymphocytes Relative: 18 %
Lymphs Abs: 0.7 10*3/uL (ref 0.7–4.0)
MCH: 29.9 pg (ref 26.0–34.0)
MCHC: 34 g/dL (ref 30.0–36.0)
MCV: 87.9 fL (ref 80.0–100.0)
Monocytes Absolute: 0.7 10*3/uL (ref 0.1–1.0)
Monocytes Relative: 18 %
Neutro Abs: 2.4 10*3/uL (ref 1.7–7.7)
Neutrophils Relative %: 60 %
Platelet Count: 84 10*3/uL — ABNORMAL LOW (ref 150–400)
RBC: 3.21 MIL/uL — ABNORMAL LOW (ref 3.87–5.11)
RDW: 14 % (ref 11.5–15.5)
WBC Count: 3.9 10*3/uL — ABNORMAL LOW (ref 4.0–10.5)
nRBC: 0 % (ref 0.0–0.2)

## 2020-06-14 MED ORDER — DIPHENHYDRAMINE HCL 25 MG PO CAPS
ORAL_CAPSULE | ORAL | Status: AC
  Filled 2020-06-14: qty 2

## 2020-06-14 MED ORDER — SODIUM CHLORIDE 0.9% FLUSH
10.0000 mL | INTRAVENOUS | Status: DC | PRN
  Administered 2020-06-14: 10 mL
  Filled 2020-06-14: qty 10

## 2020-06-14 MED ORDER — METHYLPREDNISOLONE SODIUM SUCC 125 MG IJ SOLR
INTRAMUSCULAR | Status: AC
  Filled 2020-06-14: qty 2

## 2020-06-14 MED ORDER — DARATUMUMAB CHEMO INJECTION 400 MG/20ML
16.0000 mg/kg | Freq: Once | INTRAVENOUS | Status: AC
  Administered 2020-06-14: 1400 mg via INTRAVENOUS
  Filled 2020-06-14: qty 60

## 2020-06-14 MED ORDER — ACETAMINOPHEN 325 MG PO TABS
ORAL_TABLET | ORAL | Status: AC
  Filled 2020-06-14: qty 2

## 2020-06-14 MED ORDER — HEPARIN SOD (PORK) LOCK FLUSH 100 UNIT/ML IV SOLN
500.0000 [IU] | Freq: Once | INTRAVENOUS | Status: AC | PRN
  Administered 2020-06-14: 500 [IU]
  Filled 2020-06-14: qty 5

## 2020-06-14 MED ORDER — OMEPRAZOLE 20 MG PO CPDR
DELAYED_RELEASE_CAPSULE | Freq: Every day | ORAL | 4 refills | Status: DC
  Filled 2020-06-14: qty 30, 30d supply, fill #0
  Filled 2020-07-20: qty 30, 30d supply, fill #1

## 2020-06-14 MED ORDER — ACETAMINOPHEN 325 MG PO TABS
650.0000 mg | ORAL_TABLET | Freq: Once | ORAL | Status: AC
  Administered 2020-06-14: 650 mg via ORAL

## 2020-06-14 MED ORDER — AZITHROMYCIN 250 MG PO TABS
ORAL_TABLET | ORAL | 0 refills | Status: DC
  Filled 2020-06-14: qty 6, 5d supply, fill #0

## 2020-06-14 MED ORDER — METHYLPREDNISOLONE SODIUM SUCC 125 MG IJ SOLR
100.0000 mg | Freq: Once | INTRAMUSCULAR | Status: AC
Start: 2020-06-14 — End: 2020-06-14
  Administered 2020-06-14: 100 mg via INTRAVENOUS

## 2020-06-14 MED ORDER — DIPHENHYDRAMINE HCL 25 MG PO CAPS
50.0000 mg | ORAL_CAPSULE | Freq: Once | ORAL | Status: AC
  Administered 2020-06-14: 50 mg via ORAL

## 2020-06-14 MED ORDER — SODIUM CHLORIDE 0.9 % IV SOLN
Freq: Once | INTRAVENOUS | Status: AC
  Filled 2020-06-14: qty 250

## 2020-06-14 MED ORDER — BENZONATATE 100 MG PO CAPS
100.0000 mg | ORAL_CAPSULE | Freq: Three times a day (TID) | ORAL | 0 refills | Status: DC | PRN
  Filled 2020-06-14: qty 21, 7d supply, fill #0

## 2020-06-14 NOTE — Patient Instructions (Addendum)
[facility] Discharge Instructions for Patients Receiving Chemotherapy  Today you received the following immunotherapy agent: Daratumumab (Darzalex)  To help prevent nausea and vomiting after your treatment, we encourage you to take your nausea medication as directed by your MD.   If you develop nausea and vomiting that is not controlled by your nausea medication, call the clinic.   BELOW ARE SYMPTOMS THAT SHOULD BE REPORTED IMMEDIATELY:  *FEVER GREATER THAN 100.5 F  *CHILLS WITH OR WITHOUT FEVER  NAUSEA AND VOMITING THAT IS NOT CONTROLLED WITH YOUR NAUSEA MEDICATION  *UNUSUAL SHORTNESS OF BREATH  *UNUSUAL BRUISING OR BLEEDING  TENDERNESS IN MOUTH AND THROAT WITH OR WITHOUT PRESENCE OF ULCERS  *URINARY PROBLEMS  *BOWEL PROBLEMS  UNUSUAL RASH Items with * indicate a potential emergency and should be followed up as soon as possible.  Feel free to call the clinic should you have any questions or concerns. The clinic phone number is [phone].  Please show the [facility] at check-in to the Emergency Department and triage nurse.  **Please pick up medications at [facility] today after visit to take for cold.

## 2020-06-14 NOTE — Progress Notes (Signed)
Per Dr. Julien Nordmann, ok for treatment today with Heart rate 112, pt. denies chest pain, dizziness, and no SOB noted. Per Dr. Julien Nordmann, ok for treatment with Platelets 86 and serum creatine 1.86   Pt. complained of cold x 6 days. States she has had congestion, and cough with yellow sputum. Denies fever, temperature here 99.4. Pt. states she would like to know what to take at home. Sandi Mealy, PA notified and in for assessment/observation and prescriptions sent. Pt. Son [place] notified and instructed to pick up medications at [facility] today after visit.

## 2020-06-19 ENCOUNTER — Telehealth: Payer: Self-pay | Admitting: Internal Medicine

## 2020-06-19 NOTE — Telephone Encounter (Signed)
Scheduled per 04/14 los, patient has been called and voicemail was left regarding upcoming appointments.

## 2020-06-21 ENCOUNTER — Other Ambulatory Visit: Payer: Self-pay

## 2020-06-21 ENCOUNTER — Inpatient Hospital Stay: Payer: Self-pay

## 2020-06-21 ENCOUNTER — Other Ambulatory Visit: Payer: Self-pay | Admitting: Internal Medicine

## 2020-06-21 VITALS — BP 118/65 | HR 106 | Temp 98.1°F | Resp 18 | Wt 181.0 lb

## 2020-06-21 DIAGNOSIS — Z95828 Presence of other vascular implants and grafts: Secondary | ICD-10-CM

## 2020-06-21 DIAGNOSIS — C9000 Multiple myeloma not having achieved remission: Secondary | ICD-10-CM

## 2020-06-21 LAB — CBC WITH DIFFERENTIAL (CANCER CENTER ONLY)
Abs Immature Granulocytes: 0.02 10*3/uL (ref 0.00–0.07)
Basophils Absolute: 0 10*3/uL (ref 0.0–0.1)
Basophils Relative: 0 %
Eosinophils Absolute: 0.1 10*3/uL (ref 0.0–0.5)
Eosinophils Relative: 4 %
HCT: 28.5 % — ABNORMAL LOW (ref 36.0–46.0)
Hemoglobin: 9.7 g/dL — ABNORMAL LOW (ref 12.0–15.0)
Immature Granulocytes: 1 %
Lymphocytes Relative: 24 %
Lymphs Abs: 0.8 10*3/uL (ref 0.7–4.0)
MCH: 29.2 pg (ref 26.0–34.0)
MCHC: 34 g/dL (ref 30.0–36.0)
MCV: 85.8 fL (ref 80.0–100.0)
Monocytes Absolute: 0.5 10*3/uL (ref 0.1–1.0)
Monocytes Relative: 15 %
Neutro Abs: 1.7 10*3/uL (ref 1.7–7.7)
Neutrophils Relative %: 56 %
Platelet Count: 65 10*3/uL — ABNORMAL LOW (ref 150–400)
RBC: 3.32 MIL/uL — ABNORMAL LOW (ref 3.87–5.11)
RDW: 13.9 % (ref 11.5–15.5)
WBC Count: 3.1 10*3/uL — ABNORMAL LOW (ref 4.0–10.5)
nRBC: 0 % (ref 0.0–0.2)

## 2020-06-21 LAB — CMP (CANCER CENTER ONLY)
ALT: <6 U/L (ref 0–44)
AST: 13 U/L — ABNORMAL LOW (ref 15–41)
Albumin: 3.3 g/dL — ABNORMAL LOW (ref 3.5–5.0)
Alkaline Phosphatase: 66 U/L (ref 38–126)
Anion gap: 8 (ref 5–15)
BUN: 27 mg/dL — ABNORMAL HIGH (ref 8–23)
CO2: 26 mmol/L (ref 22–32)
Calcium: 8.5 mg/dL — ABNORMAL LOW (ref 8.9–10.3)
Chloride: 98 mmol/L (ref 98–111)
Creatinine: 1.95 mg/dL — ABNORMAL HIGH (ref 0.44–1.00)
GFR, Estimated: 27 mL/min — ABNORMAL LOW (ref >60)
Glucose, Bld: 88 mg/dL (ref 70–99)
Potassium: 3.9 mmol/L (ref 3.5–5.1)
Sodium: 132 mmol/L — ABNORMAL LOW (ref 135–145)
Total Bilirubin: 0.3 mg/dL (ref 0.3–1.2)
Total Protein: 5.7 g/dL — ABNORMAL LOW (ref 6.5–8.1)

## 2020-06-21 MED ORDER — DIPHENHYDRAMINE HCL 25 MG PO CAPS
ORAL_CAPSULE | ORAL | Status: AC
  Filled 2020-06-21: qty 2

## 2020-06-21 MED ORDER — METHYLPREDNISOLONE SODIUM SUCC 125 MG IJ SOLR
100.0000 mg | Freq: Once | INTRAMUSCULAR | Status: AC
  Administered 2020-06-21: 100 mg via INTRAVENOUS

## 2020-06-21 MED ORDER — SODIUM CHLORIDE 0.9% FLUSH
10.0000 mL | INTRAVENOUS | Status: DC | PRN
  Administered 2020-06-21: 10 mL
  Filled 2020-06-21: qty 10

## 2020-06-21 MED ORDER — DIPHENHYDRAMINE HCL 25 MG PO CAPS
50.0000 mg | ORAL_CAPSULE | Freq: Once | ORAL | Status: AC
  Administered 2020-06-21: 50 mg via ORAL

## 2020-06-21 MED ORDER — SODIUM CHLORIDE 0.9 % IV SOLN
Freq: Once | INTRAVENOUS | Status: AC
  Filled 2020-06-21: qty 250

## 2020-06-21 MED ORDER — SODIUM CHLORIDE 0.9 % IV SOLN
16.0000 mg/kg | Freq: Once | INTRAVENOUS | Status: AC
  Administered 2020-06-21: 1400 mg via INTRAVENOUS
  Filled 2020-06-21: qty 60

## 2020-06-21 MED ORDER — ACETAMINOPHEN 325 MG PO TABS
ORAL_TABLET | ORAL | Status: AC
  Filled 2020-06-21: qty 1

## 2020-06-21 MED ORDER — ACETAMINOPHEN 325 MG PO TABS
650.0000 mg | ORAL_TABLET | Freq: Once | ORAL | Status: AC
Start: 2020-06-21 — End: 2020-06-21
  Administered 2020-06-21: 650 mg via ORAL

## 2020-06-21 MED ORDER — METHYLPREDNISOLONE SODIUM SUCC 125 MG IJ SOLR
INTRAMUSCULAR | Status: AC
  Filled 2020-06-21: qty 2

## 2020-06-21 MED ORDER — HEPARIN SOD (PORK) LOCK FLUSH 100 UNIT/ML IV SOLN
500.0000 [IU] | Freq: Once | INTRAVENOUS | Status: AC | PRN
  Administered 2020-06-21: 500 [IU]
  Filled 2020-06-21: qty 5

## 2020-06-21 NOTE — Progress Notes (Signed)
Ok to treat with HR of 106, Plts of 65 and Scr of 1.95 per Dr.Mohamed

## 2020-06-21 NOTE — Patient Instructions (Signed)
[facility]   Discharge Instructions: Thank you for choosing [facility] to provide your oncology and hematology care.   If you have a lab appointment with the Cancer Center, please go directly to the Cancer Center and check in at the registration area.   Wear comfortable clothing and clothing appropriate for easy access to any Portacath or PICC line.   We strive to give you quality time with your provider. You may need to reschedule your appointment if you arrive late (15 or more minutes).  Arriving late affects you and other patients whose appointments are after yours.  Also, if you miss three or more appointments without notifying the office, you may be dismissed from the clinic at the provider's discretion.      For prescription refill requests, have your pharmacy contact our office and allow 72 hours for refills to be completed.    Today you received the following chemotherapy and/or immunotherapy agents: daratumumab      To help prevent nausea and vomiting after your treatment, we encourage you to take your nausea medication as directed.  BELOW ARE SYMPTOMS THAT SHOULD BE REPORTED IMMEDIATELY: *FEVER GREATER THAN 100.4 F (38 C) OR HIGHER *CHILLS OR SWEATING *NAUSEA AND VOMITING THAT IS NOT CONTROLLED WITH YOUR NAUSEA MEDICATION *UNUSUAL SHORTNESS OF BREATH *UNUSUAL BRUISING OR BLEEDING *URINARY PROBLEMS (pain or burning when urinating, or frequent urination) *BOWEL PROBLEMS (unusual diarrhea, constipation, pain near the anus) TENDERNESS IN MOUTH AND THROAT WITH OR WITHOUT PRESENCE OF ULCERS (sore throat, sores in mouth, or a toothache) UNUSUAL RASH, SWELLING OR PAIN  UNUSUAL VAGINAL DISCHARGE OR ITCHING   Items with * indicate a potential emergency and should be followed up as soon as possible or go to the Emergency Department if any problems should occur.  Please show the CHEMOTHERAPY ALERT CARD or IMMUNOTHERAPY ALERT CARD at check-in  to the Emergency Department and triage nurse.  Should you have questions after your visit or need to cancel or reschedule your appointment, please contact [facility]  Dept: [phone]  and follow the prompts.  Office hours are 8:00 a.m. to 4:30 p.m. Monday - Friday. Please note that voicemails left after 4:00 p.m. may not be returned until the following business day.  We are closed weekends and major holidays. You have access to a nurse at all times for urgent questions. Please call the main number to the clinic Dept: [phone] and follow the prompts.   For any non-urgent questions, you may also contact your provider using MyChart. We now offer e-Visits for anyone 18 and older to request care online for non-urgent symptoms. For details visit [email or web address].   Also download the MyChart app! Go to the app store, search "MyChart", open the app, select , and log in with your MyChart username and password.  Due to Covid, a mask is required upon entering the hospital/clinic. If you do not have a mask, one will be given to you upon arrival. For doctor visits, patients may have 1 support person aged 18 or older with them. For treatment visits, patients cannot have anyone with them due to current Covid guidelines and our immunocompromised population.   

## 2020-06-28 ENCOUNTER — Other Ambulatory Visit (HOSPITAL_COMMUNITY): Payer: Self-pay

## 2020-06-28 ENCOUNTER — Inpatient Hospital Stay: Payer: Self-pay

## 2020-06-28 ENCOUNTER — Encounter: Payer: Self-pay | Admitting: Internal Medicine

## 2020-06-28 ENCOUNTER — Other Ambulatory Visit: Payer: Self-pay

## 2020-06-28 ENCOUNTER — Inpatient Hospital Stay: Payer: Self-pay | Attending: Internal Medicine

## 2020-06-28 ENCOUNTER — Inpatient Hospital Stay (HOSPITAL_BASED_OUTPATIENT_CLINIC_OR_DEPARTMENT_OTHER): Payer: Self-pay | Admitting: Internal Medicine

## 2020-06-28 VITALS — BP 145/87 | HR 107 | Resp 18

## 2020-06-28 VITALS — BP 136/95 | HR 107 | Temp 97.7°F | Resp 18 | Ht 68.0 in | Wt 176.3 lb

## 2020-06-28 DIAGNOSIS — Z5112 Encounter for antineoplastic immunotherapy: Secondary | ICD-10-CM | POA: Insufficient documentation

## 2020-06-28 DIAGNOSIS — Z5111 Encounter for antineoplastic chemotherapy: Secondary | ICD-10-CM

## 2020-06-28 DIAGNOSIS — Z95828 Presence of other vascular implants and grafts: Secondary | ICD-10-CM

## 2020-06-28 DIAGNOSIS — C9000 Multiple myeloma not having achieved remission: Secondary | ICD-10-CM

## 2020-06-28 DIAGNOSIS — Z79899 Other long term (current) drug therapy: Secondary | ICD-10-CM | POA: Insufficient documentation

## 2020-06-28 LAB — CMP (CANCER CENTER ONLY)
ALT: <6 U/L (ref 0–44)
AST: 13 U/L — ABNORMAL LOW (ref 15–41)
Albumin: 3.4 g/dL — ABNORMAL LOW (ref 3.5–5.0)
Alkaline Phosphatase: 65 U/L (ref 38–126)
Anion gap: 8 (ref 5–15)
BUN: 36 mg/dL — ABNORMAL HIGH (ref 8–23)
CO2: 26 mmol/L (ref 22–32)
Calcium: 8.4 mg/dL — ABNORMAL LOW (ref 8.9–10.3)
Chloride: 100 mmol/L (ref 98–111)
Creatinine: 2.05 mg/dL — ABNORMAL HIGH (ref 0.44–1.00)
GFR, Estimated: 26 mL/min — ABNORMAL LOW (ref >60)
Glucose, Bld: 86 mg/dL (ref 70–99)
Potassium: 3.8 mmol/L (ref 3.5–5.1)
Sodium: 134 mmol/L — ABNORMAL LOW (ref 135–145)
Total Bilirubin: 0.4 mg/dL (ref 0.3–1.2)
Total Protein: 5.6 g/dL — ABNORMAL LOW (ref 6.5–8.1)

## 2020-06-28 LAB — CBC WITH DIFFERENTIAL (CANCER CENTER ONLY)
Abs Immature Granulocytes: 0.01 10*3/uL (ref 0.00–0.07)
Basophils Absolute: 0 10*3/uL (ref 0.0–0.1)
Basophils Relative: 1 %
Eosinophils Absolute: 0.2 10*3/uL (ref 0.0–0.5)
Eosinophils Relative: 5 %
HCT: 27.6 % — ABNORMAL LOW (ref 36.0–46.0)
Hemoglobin: 9.6 g/dL — ABNORMAL LOW (ref 12.0–15.0)
Immature Granulocytes: 0 %
Lymphocytes Relative: 27 %
Lymphs Abs: 0.9 10*3/uL (ref 0.7–4.0)
MCH: 29.3 pg (ref 26.0–34.0)
MCHC: 34.8 g/dL (ref 30.0–36.0)
MCV: 84.1 fL (ref 80.0–100.0)
Monocytes Absolute: 0.5 10*3/uL (ref 0.1–1.0)
Monocytes Relative: 14 %
Neutro Abs: 1.8 10*3/uL (ref 1.7–7.7)
Neutrophils Relative %: 53 %
Platelet Count: 72 10*3/uL — ABNORMAL LOW (ref 150–400)
RBC: 3.28 MIL/uL — ABNORMAL LOW (ref 3.87–5.11)
RDW: 14.8 % (ref 11.5–15.5)
WBC Count: 3.3 10*3/uL — ABNORMAL LOW (ref 4.0–10.5)
nRBC: 0 % (ref 0.0–0.2)

## 2020-06-28 MED ORDER — DIPHENHYDRAMINE HCL 25 MG PO CAPS
ORAL_CAPSULE | ORAL | Status: AC
  Filled 2020-06-28: qty 2

## 2020-06-28 MED ORDER — METHYLPREDNISOLONE SODIUM SUCC 125 MG IJ SOLR
INTRAMUSCULAR | Status: AC
  Filled 2020-06-28: qty 2

## 2020-06-28 MED ORDER — HEPARIN SOD (PORK) LOCK FLUSH 100 UNIT/ML IV SOLN
500.0000 [IU] | Freq: Once | INTRAVENOUS | Status: AC | PRN
  Administered 2020-06-28: 500 [IU]
  Filled 2020-06-28: qty 5

## 2020-06-28 MED ORDER — SODIUM CHLORIDE 0.9% FLUSH
10.0000 mL | INTRAVENOUS | Status: DC | PRN
  Administered 2020-06-28: 10 mL
  Filled 2020-06-28: qty 10

## 2020-06-28 MED ORDER — DIPHENHYDRAMINE HCL 25 MG PO CAPS
50.0000 mg | ORAL_CAPSULE | Freq: Once | ORAL | Status: AC
  Administered 2020-06-28: 50 mg via ORAL

## 2020-06-28 MED ORDER — METHYLPREDNISOLONE SODIUM SUCC 125 MG IJ SOLR
100.0000 mg | Freq: Once | INTRAMUSCULAR | Status: AC
  Administered 2020-06-28: 100 mg via INTRAVENOUS

## 2020-06-28 MED ORDER — SODIUM CHLORIDE 0.9 % IV SOLN
Freq: Once | INTRAVENOUS | Status: AC
  Filled 2020-06-28: qty 250

## 2020-06-28 MED ORDER — SODIUM CHLORIDE 0.9 % IV SOLN
16.0000 mg/kg | Freq: Once | INTRAVENOUS | Status: AC
  Administered 2020-06-28: 1400 mg via INTRAVENOUS
  Filled 2020-06-28: qty 60

## 2020-06-28 MED ORDER — ACETAMINOPHEN 325 MG PO TABS
650.0000 mg | ORAL_TABLET | Freq: Once | ORAL | Status: AC
  Administered 2020-06-28: 650 mg via ORAL

## 2020-06-28 MED ORDER — ACETAMINOPHEN 325 MG PO TABS
ORAL_TABLET | ORAL | Status: AC
  Filled 2020-06-28: qty 2

## 2020-06-28 MED FILL — Amlodipine Besylate Tab 2.5 MG (Base Equivalent): ORAL | 30 days supply | Qty: 30 | Fill #1 | Status: AC

## 2020-06-28 MED FILL — Hydralazine HCl Tab 100 MG: ORAL | 30 days supply | Qty: 90 | Fill #1 | Status: AC

## 2020-06-28 MED FILL — Ferrous Sulfate Tab 325 MG (65 MG Elemental Fe): ORAL | 30 days supply | Qty: 30 | Fill #1 | Status: AC

## 2020-06-28 MED FILL — Isosorbide Mononitrate Tab ER 24HR 30 MG: ORAL | 30 days supply | Qty: 30 | Fill #1 | Status: AC

## 2020-06-28 MED FILL — Carvedilol Tab 12.5 MG: ORAL | 30 days supply | Qty: 60 | Fill #1 | Status: AC

## 2020-06-28 MED FILL — Furosemide Tab 40 MG: ORAL | 30 days supply | Qty: 60 | Fill #1 | Status: AC

## 2020-06-28 NOTE — Progress Notes (Signed)
Per Dr Julien Nordmann, it is okay to treat with Daratumumab  and plts of 72K.

## 2020-06-28 NOTE — Patient Instructions (Signed)
[facility]   Discharge Instructions: Thank you for choosing [facility] to provide your oncology and hematology care.   If you have a lab appointment with the Cancer Center, please go directly to the Cancer Center and check in at the registration area.   Wear comfortable clothing and clothing appropriate for easy access to any Portacath or PICC line.   We strive to give you quality time with your provider. You may need to reschedule your appointment if you arrive late (15 or more minutes).  Arriving late affects you and other patients whose appointments are after yours.  Also, if you miss three or more appointments without notifying the office, you may be dismissed from the clinic at the provider's discretion.      For prescription refill requests, have your pharmacy contact our office and allow 72 hours for refills to be completed.    Today you received the following chemotherapy and/or immunotherapy agents: Daratumumab (Darzalex)      To help prevent nausea and vomiting after your treatment, we encourage you to take your nausea medication as directed.  BELOW ARE SYMPTOMS THAT SHOULD BE REPORTED IMMEDIATELY: *FEVER GREATER THAN 100.4 F (38 C) OR HIGHER *CHILLS OR SWEATING *NAUSEA AND VOMITING THAT IS NOT CONTROLLED WITH YOUR NAUSEA MEDICATION *UNUSUAL SHORTNESS OF BREATH *UNUSUAL BRUISING OR BLEEDING *URINARY PROBLEMS (pain or burning when urinating, or frequent urination) *BOWEL PROBLEMS (unusual diarrhea, constipation, pain near the anus) TENDERNESS IN MOUTH AND THROAT WITH OR WITHOUT PRESENCE OF ULCERS (sore throat, sores in mouth, or a toothache) UNUSUAL RASH, SWELLING OR PAIN  UNUSUAL VAGINAL DISCHARGE OR ITCHING   Items with * indicate a potential emergency and should be followed up as soon as possible or go to the Emergency Department if any problems should occur.  Please show the CHEMOTHERAPY ALERT CARD or IMMUNOTHERAPY ALERT CARD  at check-in to the Emergency Department and triage nurse.  Should you have questions after your visit or need to cancel or reschedule your appointment, please contact [facility]  Dept: [phone]  and follow the prompts.  Office hours are 8:00 a.m. to 4:30 p.m. Monday - Friday. Please note that voicemails left after 4:00 p.m. may not be returned until the following business day.  We are closed weekends and major holidays. You have access to a nurse at all times for urgent questions. Please call the main number to the clinic Dept: [phone] and follow the prompts.   For any non-urgent questions, you may also contact your provider using MyChart. We now offer e-Visits for anyone 18 and older to request care online for non-urgent symptoms. For details visit [email or web address].   Also download the MyChart app! Go to the app store, search "MyChart", open the app, select [facility], and log in with your MyChart username and password.  Due to Covid, a mask is required upon entering the hospital/clinic. If you do not have a mask, one will be given to you upon arrival. For doctor visits, patients may have 1 support person aged 18 or older with them. For treatment visits, patients cannot have anyone with them due to current Covid guidelines and our immunocompromised population.   

## 2020-06-28 NOTE — Patient Instructions (Signed)

## 2020-06-28 NOTE — Progress Notes (Signed)
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  PRIOR THERAPY: 1) Systemic therapy with Velcade 1.3 mg/M2 weekly, Revlimid 25 mg p.o. daily for 14 days every 3 weeks in addition to weekly Decadron 40 mg orally. First dose of treatment 10/08/2017.  Treatment was placed on hold due to development of a significant rash.  She resumed treatment with only dexamethasone and Velcade on 11/19/2017. This was discontinued on 04/14/2019 due to evidence of disease progression.  2) Pomalyst 4 mg p.o. for 21 days every 4 weeks and 40 mg p.o. Decadron once a week. First dose 04/29/2019-05/04/2019. Status post 6 days of treatment. Discontinued secondary to allergy to Pomalyst.  3) Chemotherapy with carfilzomib on days 1, 2, 8, 9, 15, and 16 and Cytoxan 300 mg/m2 on days 1, 8, and 15 IV every 4 weeks and 40 mg p.o. weekly of Decadron. Last dose expected on 03/02/2020. Status post 11 cycles. Discontinued due to intolerance  CURRENT THERAPY: Chemotherapy with Daratumumab on days 1, 8, 15, and 22 IV every 4 weeks and 20 mg p.o. weekly of Decadron. First dose on 05/17/2020.  Status post 1 cycle.  INTERVAL HISTORY: Debbie Chan 70 y.o. female returns to the clinic today for follow-up visit.  The patient continues to complain of increasing fatigue and weakness especially in the lower extremities.  She denied having any chest pain, shortness of breath, cough or hemoptysis.  She denied having any fever or chills.  She has no nausea, vomiting, diarrhea or constipation.  She lost around 5 pounds since her last visit.  The patient is here today for evaluation before starting day 8 of cycle #2.   MEDICAL HISTORY: Past Medical History:  Diagnosis Date  . Cancer ([facility])     ALLERGIES:  has No Known Allergies.  MEDICATIONS:  Current Outpatient  Medications  Medication Sig Dispense Refill  . acetaminophen (TYLENOL) 325 MG tablet Take 2 tablets (650 mg total) by mouth every 6 (six) hours as needed for mild pain (or Fever >/= 101).    Marland Kitchen acyclovir (ZOVIRAX) 200 MG capsule TAKE 1 CAPSULE BY MOUTH TWICE DAILY 60 capsule 4  . amLODipine (NORVASC) 2.5 MG tablet TAKE 1 TABLET BY MOUTH ONCE A DAY 30 tablet 3  . azithromycin (ZITHROMAX Z-PAK) 250 MG tablet Take 2 tablets day 1 and then 1 tablet daily days 2 through 5 (Patient taking differently: Take 2 tablets day 1 and then 1 tablet daily days 2 through 5) 6 each 0  . benzonatate (TESSALON) 100 MG capsule Take 1 capsule (100 mg total) by mouth 3 (three) times daily as needed for cough. 21 capsule 0  . blood glucose meter kit and supplies Dispense based on patient and insurance preference. Use up to four times daily as directed. (FOR ICD-10 E10.9, E11.9). 1 each 0  . carvedilol (COREG) 12.5 MG tablet TAKE 1 TABLET BY MOUTH 2 TIMES DAILY WITH A MEAL 60 tablet 3  . dexamethasone 20 MG TABS Please take 1  tablet once a week on the day of treatment 30 tablet 2  . ferrous sulfate 325 (65 FE) MG tablet TAKE 1 TABLET BY MOUTH ONCE A DAY WITH BREAKFAST 30 tablet 3  . furosemide (LASIX) 40 MG tablet TAKE 1 TABLET BY MOUTH 2 TIMES DAILY 60 tablet 3  . hydrALAZINE (APRESOLINE) 100 MG tablet  TAKE 1 TABLET BY MOUTH EVERY 8 HOURS 90 tablet 3  . isosorbide mononitrate (IMDUR) 30 MG 24 hr tablet TAKE 1 TABLET BY MOUTH ONCE A DAY 30 tablet 3  . lidocaine-prilocaine (EMLA) cream Apply topically as needed 30 g 1  . loratadine (CLARITIN) 10 MG tablet Take 10 mg by mouth daily.    Marland Kitchen MELATONIN PO Take 10 mg by mouth at bedtime as needed (sleep).    Marland Kitchen omeprazole (PRILOSEC) 20 MG capsule TAKE 1 CAPSULE BY MOUTH ONCE DAILY 30 capsule 4  . ReliOn Ultra Thin Lancets 30G MISC USE 1 TO CHECK GLUCOSE ONCE DAILY FOR GLUCOSE TESTING     No current facility-administered medications for this visit.   Facility-Administered  Medications Ordered in Other Visits  Medication Dose Route Frequency Provider Last Rate Last Admin  . sodium chloride flush (NS) 0.9 % injection 10 mL  10 mL Intracatheter PRN Curt Bears, MD   10 mL at 02/22/20 1127    SURGICAL HISTORY:  Past Surgical History:  Procedure Laterality Date  . IR IMAGING GUIDED PORT INSERTION  05/10/2019    REVIEW OF SYSTEMS:  A comprehensive review of systems was negative except for: Constitutional: positive for fatigue and weight loss Musculoskeletal: positive for arthralgias and muscle weakness   PHYSICAL EXAMINATION: General appearance: alert, cooperative, fatigued and no distress Head: Normocephalic, without obvious abnormality, atraumatic Neck: no adenopathy, no JVD, supple, symmetrical, trachea midline and thyroid not enlarged, symmetric, no tenderness/mass/nodules Lymph nodes: Cervical, supraclavicular, and axillary nodes normal. Resp: clear to auscultation bilaterally Back: symmetric, no curvature. ROM normal. No CVA tenderness. Cardio: regular rate and rhythm, S1, S2 normal, no murmur, click, rub or gallop GI: soft, non-tender; bowel sounds normal; no masses,  no organomegaly Extremities: extremities normal, atraumatic, no cyanosis or edema  ECOG PERFORMANCE STATUS: 1 - Symptomatic but completely ambulatory  Blood pressure (!) 136/95, pulse (!) 107, temperature 97.7 F (36.5 C), temperature source Tympanic, resp. rate 18, height '5\' 8"'  (1.727 m), weight 176 lb 4.8 oz (80 kg), SpO2 100 %.  LABORATORY DATA: Lab Results  Component Value Date   WBC 3.3 (L) 06/28/2020   HGB 9.6 (L) 06/28/2020   HCT 27.6 (L) 06/28/2020   MCV 84.1 06/28/2020   PLT 72 (L) 06/28/2020      Chemistry      Component Value Date/Time   NA 132 (L) 06/21/2020 0926   NA 132 (L) 04/17/2020 1612   K 3.9 06/21/2020 0926   CL 98 06/21/2020 0926   CO2 26 06/21/2020 0926   BUN 27 (H) 06/21/2020 0926   BUN 46 (H) 04/17/2020 1612   CREATININE 1.95 (H) 06/21/2020  0926      Component Value Date/Time   CALCIUM 8.5 (L) 06/21/2020 0926   ALKPHOS 66 06/21/2020 0926   AST 13 (L) 06/21/2020 0926   ALT <6 06/21/2020 0926   BILITOT 0.3 06/21/2020 0926       RADIOGRAPHIC STUDIES: No results found.  ASSESSMENT AND PLAN: This is a very pleasant 70 years old African female originally from [place] who is visiting her son in [place] and was recently diagnosed with IgA multiple myeloma. The patient was a started initially on treatment with weekly subcutaneous Velcade 1.3 mg/M2, Revlimid 25 mg p.o. daily for 21 days every 4 weeks as well as weekly Decadron 40 mg orally.  Revlimid was discontinued secondary to hypersensitivity reaction with significant skin rash.  She is currently on treatment with weekly Velcade and Decadron. Her treatment  was discontinued secondary to disease progression.  The patient started treatment with Pomalyst and Decadron but this was discontinued secondary to hypersensitivity reaction to the Pomalyst. She underwent systemic chemotherapy with carfilzomib, Cytoxan and Decadron status post 11 cycles. The patient tolerated the previous treatment well but this was discontinued secondary to disease progression as well as recent hospitalization with COVID-19 infection and pneumonia.  She is recovering well but she has not received any treatment for the last 6 weeks or more. She started treatment with daratumumab and Decadron status post 1 cycle in addition to day 1 of cycle #2. The patient has been tolerating this treatment well except for generalized fatigue and weakness which is close to her baseline. I recommended for her to proceed with day 8 of cycle #2 today as planned. I will see her back for follow-up visit in 3 weeks for evaluation. She was advised to call immediately if she has any other concerning symptoms in the interval. The patient voices understanding of current disease status and treatment options and is in  agreement with the current care plan.  All questions were answered. The patient knows to call the clinic with any problems, questions or concerns. We can certainly see the patient much sooner if necessary.   Disclaimer: This note was dictated with voice recognition software. Similar sounding words can inadvertently be transcribed and may not be corrected upon review.

## 2020-07-02 ENCOUNTER — Telehealth: Payer: Self-pay | Admitting: General Practice

## 2020-07-02 NOTE — Telephone Encounter (Signed)
Spoke with patient's son Alta Corning) to discuss rescheduling the Lexiscan myoview at [facility]--appointments on 05/03/20 and 05/31/20 were cancelled---patient had a very bad experience and does not wish to reschedule.

## 2020-07-05 ENCOUNTER — Inpatient Hospital Stay: Payer: Self-pay

## 2020-07-05 ENCOUNTER — Other Ambulatory Visit: Payer: Self-pay

## 2020-07-05 VITALS — BP 136/73 | HR 106 | Temp 98.7°F | Resp 16 | Wt 183.8 lb

## 2020-07-05 DIAGNOSIS — Z95828 Presence of other vascular implants and grafts: Secondary | ICD-10-CM

## 2020-07-05 DIAGNOSIS — C9000 Multiple myeloma not having achieved remission: Secondary | ICD-10-CM

## 2020-07-05 LAB — CMP (CANCER CENTER ONLY)
ALT: 9 U/L (ref 0–44)
AST: 17 U/L (ref 15–41)
Albumin: 3.4 g/dL — ABNORMAL LOW (ref 3.5–5.0)
Alkaline Phosphatase: 66 U/L (ref 38–126)
Anion gap: 8 (ref 5–15)
BUN: 34 mg/dL — ABNORMAL HIGH (ref 8–23)
CO2: 28 mmol/L (ref 22–32)
Calcium: 8.6 mg/dL — ABNORMAL LOW (ref 8.9–10.3)
Chloride: 100 mmol/L (ref 98–111)
Creatinine: 2.2 mg/dL — ABNORMAL HIGH (ref 0.44–1.00)
GFR, Estimated: 24 mL/min — ABNORMAL LOW (ref >60)
Glucose, Bld: 93 mg/dL (ref 70–99)
Potassium: 3.9 mmol/L (ref 3.5–5.1)
Sodium: 136 mmol/L (ref 135–145)
Total Bilirubin: 0.5 mg/dL (ref 0.3–1.2)
Total Protein: 5.5 g/dL — ABNORMAL LOW (ref 6.5–8.1)

## 2020-07-05 LAB — CBC WITH DIFFERENTIAL (CANCER CENTER ONLY)
Abs Immature Granulocytes: 0.01 10*3/uL (ref 0.00–0.07)
Basophils Absolute: 0 10*3/uL (ref 0.0–0.1)
Basophils Relative: 1 %
Eosinophils Absolute: 0.2 10*3/uL (ref 0.0–0.5)
Eosinophils Relative: 5 %
HCT: 27.9 % — ABNORMAL LOW (ref 36.0–46.0)
Hemoglobin: 9.5 g/dL — ABNORMAL LOW (ref 12.0–15.0)
Immature Granulocytes: 0 %
Lymphocytes Relative: 28 %
Lymphs Abs: 1 10*3/uL (ref 0.7–4.0)
MCH: 29.1 pg (ref 26.0–34.0)
MCHC: 34.1 g/dL (ref 30.0–36.0)
MCV: 85.3 fL (ref 80.0–100.0)
Monocytes Absolute: 0.5 10*3/uL (ref 0.1–1.0)
Monocytes Relative: 15 %
Neutro Abs: 1.8 10*3/uL (ref 1.7–7.7)
Neutrophils Relative %: 51 %
Platelet Count: 54 10*3/uL — ABNORMAL LOW (ref 150–400)
RBC: 3.27 MIL/uL — ABNORMAL LOW (ref 3.87–5.11)
RDW: 15.6 % — ABNORMAL HIGH (ref 11.5–15.5)
WBC Count: 3.5 10*3/uL — ABNORMAL LOW (ref 4.0–10.5)
nRBC: 0 % (ref 0.0–0.2)

## 2020-07-05 MED ORDER — METHYLPREDNISOLONE SODIUM SUCC 125 MG IJ SOLR
100.0000 mg | Freq: Once | INTRAMUSCULAR | Status: AC
Start: 2020-07-05 — End: 2020-07-05
  Administered 2020-07-05: 100 mg via INTRAVENOUS

## 2020-07-05 MED ORDER — SODIUM CHLORIDE 0.9% FLUSH
10.0000 mL | INTRAVENOUS | Status: DC | PRN
  Administered 2020-07-05: 10 mL
  Filled 2020-07-05: qty 10

## 2020-07-05 MED ORDER — METHYLPREDNISOLONE SODIUM SUCC 125 MG IJ SOLR
INTRAMUSCULAR | Status: AC
  Filled 2020-07-05: qty 2

## 2020-07-05 MED ORDER — HEPARIN SOD (PORK) LOCK FLUSH 100 UNIT/ML IV SOLN
500.0000 [IU] | Freq: Once | INTRAVENOUS | Status: AC | PRN
  Administered 2020-07-05: 500 [IU]
  Filled 2020-07-05: qty 5

## 2020-07-05 MED ORDER — SODIUM CHLORIDE 0.9 % IV SOLN
16.0000 mg/kg | Freq: Once | INTRAVENOUS | Status: AC
  Administered 2020-07-05: 1400 mg via INTRAVENOUS
  Filled 2020-07-05: qty 60

## 2020-07-05 MED ORDER — ACETAMINOPHEN 325 MG PO TABS
650.0000 mg | ORAL_TABLET | Freq: Once | ORAL | Status: AC
Start: 2020-07-05 — End: 2020-07-05
  Administered 2020-07-05: 650 mg via ORAL

## 2020-07-05 MED ORDER — SODIUM CHLORIDE 0.9 % IV SOLN
Freq: Once | INTRAVENOUS | Status: AC
  Filled 2020-07-05: qty 250

## 2020-07-05 MED ORDER — DIPHENHYDRAMINE HCL 25 MG PO CAPS
50.0000 mg | ORAL_CAPSULE | Freq: Once | ORAL | Status: AC
  Administered 2020-07-05: 50 mg via ORAL

## 2020-07-05 MED ORDER — DIPHENHYDRAMINE HCL 25 MG PO CAPS
ORAL_CAPSULE | ORAL | Status: AC
  Filled 2020-07-05: qty 2

## 2020-07-05 MED ORDER — ACETAMINOPHEN 325 MG PO TABS
ORAL_TABLET | ORAL | Status: AC
  Filled 2020-07-05: qty 2

## 2020-07-05 NOTE — Patient Instructions (Signed)
[facility]  Discharge Instructions: Thank you for choosing [facility] to provide your oncology and hematology care.   If you have a lab appointment with the [facility], please go directly to the [facility] and check in at the registration area.   Wear comfortable clothing and clothing appropriate for easy access to any Portacath or PICC line.   We strive to give you quality time with your provider. You may need to reschedule your appointment if you arrive late (15 or more minutes).  Arriving late affects you and other patients whose appointments are after yours.  Also, if you miss three or more appointments without notifying the office, you may be dismissed from the clinic at the provider's discretion.      For prescription refill requests, have your pharmacy contact our office and allow 72 hours for refills to be completed.    Today you received the following chemotherapy and/or immunotherapy agents Daratubumab   To help prevent nausea and vomiting after your treatment, we encourage you to take your nausea medication as directed.  BELOW ARE SYMPTOMS THAT SHOULD BE REPORTED IMMEDIATELY: . *FEVER GREATER THAN 100.4 F (38 C) OR HIGHER . *CHILLS OR SWEATING . *NAUSEA AND VOMITING THAT IS NOT CONTROLLED WITH YOUR NAUSEA MEDICATION . *UNUSUAL SHORTNESS OF BREATH . *UNUSUAL BRUISING OR BLEEDING . *URINARY PROBLEMS (pain or burning when urinating, or frequent urination) . *BOWEL PROBLEMS (unusual diarrhea, constipation, pain near the anus) . TENDERNESS IN MOUTH AND THROAT WITH OR WITHOUT PRESENCE OF ULCERS (sore throat, sores in mouth, or a toothache) . UNUSUAL RASH, SWELLING OR PAIN  . UNUSUAL VAGINAL DISCHARGE OR ITCHING   Items with * indicate a potential emergency and should be followed up as soon as possible or go to the Emergency Department if any problems should occur.  Please show the CHEMOTHERAPY ALERT CARD or IMMUNOTHERAPY ALERT  CARD at check-in to the Emergency Department and triage nurse.  Should you have questions after your visit or need to cancel or reschedule your appointment, please contact [facility]  Dept: [phone]  and follow the prompts.  Office hours are 8:00 a.m. to 4:30 p.m. Monday - Friday. Please note that voicemails left after 4:00 p.m. may not be returned until the following business day.  We are closed weekends and major holidays. You have access to a nurse at all times for urgent questions. Please call the main number to the clinic Dept: [phone] and follow the prompts.   For any non-urgent questions, you may also contact your provider using MyChart. We now offer e-Visits for anyone 69 and older to request care online for non-urgent symptoms. For details visit [email or web address].   Also download the MyChart app! Go to the app store, search "MyChart", open the app, select [facility], and log in with your MyChart username and password.  Due to Covid, a mask is required upon entering the hospital/clinic. If you do not have a mask, one will be given to you upon arrival. For doctor visits, patients may have 1 support person aged 44 or older with them. For treatment visits, patients cannot have anyone with them due to current Covid guidelines and our immunocompromised population.

## 2020-07-05 NOTE — Progress Notes (Signed)
Patient denies any signs/symptoms of acute bleeding. Per Dr. Burr Medico: OK to treat with platelets of 54.

## 2020-07-12 ENCOUNTER — Inpatient Hospital Stay: Payer: Self-pay

## 2020-07-12 ENCOUNTER — Inpatient Hospital Stay: Payer: Self-pay | Admitting: Nutrition

## 2020-07-12 ENCOUNTER — Other Ambulatory Visit: Payer: Self-pay

## 2020-07-12 VITALS — BP 153/93 | HR 105 | Temp 98.9°F | Resp 18 | Ht 68.0 in | Wt 183.5 lb

## 2020-07-12 DIAGNOSIS — C9000 Multiple myeloma not having achieved remission: Secondary | ICD-10-CM

## 2020-07-12 DIAGNOSIS — Z95828 Presence of other vascular implants and grafts: Secondary | ICD-10-CM

## 2020-07-12 LAB — CBC WITH DIFFERENTIAL (CANCER CENTER ONLY)
Abs Immature Granulocytes: 0 10*3/uL (ref 0.00–0.07)
Basophils Absolute: 0 10*3/uL (ref 0.0–0.1)
Basophils Relative: 0 %
Eosinophils Absolute: 0.1 10*3/uL (ref 0.0–0.5)
Eosinophils Relative: 4 %
HCT: 28.9 % — ABNORMAL LOW (ref 36.0–46.0)
Hemoglobin: 10.3 g/dL — ABNORMAL LOW (ref 12.0–15.0)
Immature Granulocytes: 0 %
Lymphocytes Relative: 34 %
Lymphs Abs: 1.1 10*3/uL (ref 0.7–4.0)
MCH: 29.8 pg (ref 26.0–34.0)
MCHC: 35.6 g/dL (ref 30.0–36.0)
MCV: 83.5 fL (ref 80.0–100.0)
Monocytes Absolute: 0.4 10*3/uL (ref 0.1–1.0)
Monocytes Relative: 14 %
Neutro Abs: 1.5 10*3/uL — ABNORMAL LOW (ref 1.7–7.7)
Neutrophils Relative %: 48 %
Platelet Count: 48 10*3/uL — ABNORMAL LOW (ref 150–400)
RBC: 3.46 MIL/uL — ABNORMAL LOW (ref 3.87–5.11)
RDW: 16 % — ABNORMAL HIGH (ref 11.5–15.5)
WBC Count: 3.2 10*3/uL — ABNORMAL LOW (ref 4.0–10.5)
nRBC: 0 % (ref 0.0–0.2)

## 2020-07-12 LAB — CMP (CANCER CENTER ONLY)
ALT: 7 U/L (ref 0–44)
AST: 15 U/L (ref 15–41)
Albumin: 3.4 g/dL — ABNORMAL LOW (ref 3.5–5.0)
Alkaline Phosphatase: 76 U/L (ref 38–126)
Anion gap: 9 (ref 5–15)
BUN: 32 mg/dL — ABNORMAL HIGH (ref 8–23)
CO2: 25 mmol/L (ref 22–32)
Calcium: 8.7 mg/dL — ABNORMAL LOW (ref 8.9–10.3)
Chloride: 101 mmol/L (ref 98–111)
Creatinine: 2.07 mg/dL — ABNORMAL HIGH (ref 0.44–1.00)
GFR, Estimated: 25 mL/min — ABNORMAL LOW (ref >60)
Glucose, Bld: 109 mg/dL — ABNORMAL HIGH (ref 70–99)
Potassium: 4.2 mmol/L (ref 3.5–5.1)
Sodium: 135 mmol/L (ref 135–145)
Total Bilirubin: 0.4 mg/dL (ref 0.3–1.2)
Total Protein: 5.7 g/dL — ABNORMAL LOW (ref 6.5–8.1)

## 2020-07-12 MED ORDER — DIPHENHYDRAMINE HCL 25 MG PO CAPS
ORAL_CAPSULE | ORAL | Status: AC
  Filled 2020-07-12: qty 2

## 2020-07-12 MED ORDER — SODIUM CHLORIDE 0.9 % IV SOLN
Freq: Once | INTRAVENOUS | Status: AC
Start: 2020-07-12 — End: 2020-07-12
  Filled 2020-07-12: qty 250

## 2020-07-12 MED ORDER — METHYLPREDNISOLONE SODIUM SUCC 125 MG IJ SOLR
INTRAMUSCULAR | Status: AC
  Filled 2020-07-12: qty 2

## 2020-07-12 MED ORDER — DIPHENHYDRAMINE HCL 25 MG PO CAPS
50.0000 mg | ORAL_CAPSULE | Freq: Once | ORAL | Status: AC
Start: 2020-07-12 — End: 2020-07-12
  Administered 2020-07-12: 50 mg via ORAL

## 2020-07-12 MED ORDER — SODIUM CHLORIDE 0.9 % IV SOLN
16.0000 mg/kg | Freq: Once | INTRAVENOUS | Status: AC
  Administered 2020-07-12: 1400 mg via INTRAVENOUS
  Filled 2020-07-12: qty 60

## 2020-07-12 MED ORDER — SODIUM CHLORIDE 0.9% FLUSH
10.0000 mL | INTRAVENOUS | Status: DC | PRN
  Administered 2020-07-12: 10 mL
  Filled 2020-07-12: qty 10

## 2020-07-12 MED ORDER — METHYLPREDNISOLONE SODIUM SUCC 125 MG IJ SOLR
100.0000 mg | Freq: Once | INTRAMUSCULAR | Status: AC
  Administered 2020-07-12: 100 mg via INTRAVENOUS

## 2020-07-12 MED ORDER — HEPARIN SOD (PORK) LOCK FLUSH 100 UNIT/ML IV SOLN
500.0000 [IU] | Freq: Once | INTRAVENOUS | Status: AC | PRN
  Administered 2020-07-12: 500 [IU]
  Filled 2020-07-12: qty 5

## 2020-07-12 MED ORDER — ACETAMINOPHEN 325 MG PO TABS
650.0000 mg | ORAL_TABLET | Freq: Once | ORAL | Status: AC
  Administered 2020-07-12: 650 mg via ORAL

## 2020-07-12 MED ORDER — ACETAMINOPHEN 325 MG PO TABS
ORAL_TABLET | ORAL | Status: AC
  Filled 2020-07-12: qty 2

## 2020-07-12 NOTE — Patient Instructions (Signed)
[facility]   Discharge Instructions: Thank you for choosing [facility] to provide your oncology and hematology care.   If you have a lab appointment with the Cancer Center, please go directly to the Cancer Center and check in at the registration area.   Wear comfortable clothing and clothing appropriate for easy access to any Portacath or PICC line.   We strive to give you quality time with your provider. You may need to reschedule your appointment if you arrive late (15 or more minutes).  Arriving late affects you and other patients whose appointments are after yours.  Also, if you miss three or more appointments without notifying the office, you may be dismissed from the clinic at the provider's discretion.      For prescription refill requests, have your pharmacy contact our office and allow 72 hours for refills to be completed.    Today you received the following chemotherapy and/or immunotherapy agents: Daratumumab (Darzalex)      To help prevent nausea and vomiting after your treatment, we encourage you to take your nausea medication as directed.  BELOW ARE SYMPTOMS THAT SHOULD BE REPORTED IMMEDIATELY: *FEVER GREATER THAN 100.4 F (38 C) OR HIGHER *CHILLS OR SWEATING *NAUSEA AND VOMITING THAT IS NOT CONTROLLED WITH YOUR NAUSEA MEDICATION *UNUSUAL SHORTNESS OF BREATH *UNUSUAL BRUISING OR BLEEDING *URINARY PROBLEMS (pain or burning when urinating, or frequent urination) *BOWEL PROBLEMS (unusual diarrhea, constipation, pain near the anus) TENDERNESS IN MOUTH AND THROAT WITH OR WITHOUT PRESENCE OF ULCERS (sore throat, sores in mouth, or a toothache) UNUSUAL RASH, SWELLING OR PAIN  UNUSUAL VAGINAL DISCHARGE OR ITCHING   Items with * indicate a potential emergency and should be followed up as soon as possible or go to the Emergency Department if any problems should occur.  Please show the CHEMOTHERAPY ALERT CARD or IMMUNOTHERAPY ALERT CARD  at check-in to the Emergency Department and triage nurse.  Should you have questions after your visit or need to cancel or reschedule your appointment, please contact [facility]  Dept: [phone]  and follow the prompts.  Office hours are 8:00 a.m. to 4:30 p.m. Monday - Friday. Please note that voicemails left after 4:00 p.m. may not be returned until the following business day.  We are closed weekends and major holidays. You have access to a nurse at all times for urgent questions. Please call the main number to the clinic Dept: [phone] and follow the prompts.   For any non-urgent questions, you may also contact your provider using MyChart. We now offer e-Visits for anyone 18 and older to request care online for non-urgent symptoms. For details visit [email or web address].   Also download the MyChart app! Go to the app store, search "MyChart", open the app, select [facility], and log in with your MyChart username and password.  Due to Covid, a mask is required upon entering the hospital/clinic. If you do not have a mask, one will be given to you upon arrival. For doctor visits, patients may have 1 support person aged 18 or older with them. For treatment visits, patients cannot have anyone with them due to current Covid guidelines and our immunocompromised population.   

## 2020-07-12 NOTE — Progress Notes (Signed)
Per Dr. Julien Nordmann: OK to treat with pulse 105, Platelet 48, ANC 1.5, and Creatine 2.07

## 2020-07-18 NOTE — Progress Notes (Signed)
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  PRIOR THERAPY:  1)Systemic therapy with Velcade 1.3 mg/M2 weekly, Revlimid 25 mg p.o. daily for 14 days every 3 weeks in addition to weekly Decadron 40 mg orally. First dose of treatment 10/08/2017.Treatment was placed on hold due to development of a significant rash.Sheresumedtreatment withonlydexamethasone and Velcade on 11/19/2017.This was discontinued on 04/14/2019 due to evidence of disease progression. 2)Pomalyst 4 mg p.o. for 21 days every 4 weeks and 40 mg p.o. Decadron once a week. First dose3/06/2019-05/04/2019.Status post 6 days of treatment. Discontinued secondary to allergy to Pomalyst. 3) Chemotherapy with carfilzomibon days 1, 2, 8, 9, 15, and 16and Cytoxan 300 mg/m2 on days 1,8, and 15 IV every 4 weeks and 40 mg p.o. weekly of Decadron. Last dose expected on 03/02/2020. Status post11cycles. Discontinued due to intolerance  CURRENT THERAPY: Chemotherapy with Daratumumabon days 1, 8, 15, and 22 IV every 4 weeks and 20 mg p.o. weekly of Decadron. First dose on 05/17/2020.  Status post 2 cycles.  INTERVAL HISTORY: Debbie Chan 70 y.o. female returns to clinic today for a follow-up visit.  The patient is endorsing generalized itching today without rash. She has been using benadryl. She also endorses when she bends over to get into the bath tub, she has some shortness of breath. She had some fluid retention which required hospitalization a few months ago. She saw cardiology at that time but is not established outpatient with them.  She denies any recent fever, chills, or night sweats. She denies unexplained weight loss. She denies any abnormal bleeding or bruising.  She denies any recent infections including upper respiratory infection skin infections, or burning with urination.  The patient is here today  for evaluation before starting cycle #3 of treatment.     MEDICAL HISTORY: Past Medical History:  Diagnosis Date  . Cancer ([place])     ALLERGIES:  has No Known Allergies.  MEDICATIONS:  Current Outpatient Medications  Medication Sig Dispense Refill  . acetaminophen (TYLENOL) 325 MG tablet Take 2 tablets (650 mg total) by mouth every 6 (six) hours as needed for mild pain (or Fever >/= 101).    Marland Kitchen acyclovir (ZOVIRAX) 200 MG capsule TAKE 1 CAPSULE BY MOUTH TWICE DAILY 60 capsule 4  . amLODipine (NORVASC) 2.5 MG tablet TAKE 1 TABLET BY MOUTH ONCE A DAY 30 tablet 3  . azithromycin (ZITHROMAX Z-PAK) 250 MG tablet Take 2 tablets day 1 and then 1 tablet daily days 2 through 5 (Patient taking differently: Take 2 tablets day 1 and then 1 tablet daily days 2 through 5) 6 each 0  . benzonatate (TESSALON) 100 MG capsule Take 1 capsule (100 mg total) by mouth 3 (three) times daily as needed for cough. 21 capsule 0  . blood glucose meter kit and supplies Dispense based on patient and insurance preference. Use up to four times daily as directed. (FOR ICD-10 E10.9, E11.9). 1 each 0  . carvedilol (COREG) 12.5 MG tablet TAKE 1 TABLET BY MOUTH 2 TIMES DAILY WITH A MEAL 60 tablet 3  . dexamethasone 20 MG TABS Please take 1  tablet once a week on the day of treatment 30 tablet 2  . ferrous sulfate 325 (65 FE) MG tablet TAKE 1 TABLET BY MOUTH ONCE A DAY WITH BREAKFAST 30 tablet 3  . furosemide (LASIX) 40 MG tablet TAKE 1 TABLET BY MOUTH 2 TIMES  DAILY 60 tablet 3  . hydrALAZINE (APRESOLINE) 100 MG tablet TAKE 1 TABLET BY MOUTH EVERY 8 HOURS 90 tablet 3  . isosorbide mononitrate (IMDUR) 30 MG 24 hr tablet TAKE 1 TABLET BY MOUTH ONCE A DAY 30 tablet 3  . lidocaine-prilocaine (EMLA) cream Apply topically as needed 30 g 1  . loratadine (CLARITIN) 10 MG tablet Take 10 mg by mouth daily.    Marland Kitchen MELATONIN PO Take 10 mg by mouth at bedtime as needed (sleep).    Marland Kitchen omeprazole (PRILOSEC) 20 MG capsule TAKE 1 CAPSULE BY MOUTH  ONCE DAILY 30 capsule 4  . ReliOn Ultra Thin Lancets 30G MISC USE 1 TO CHECK GLUCOSE ONCE DAILY FOR GLUCOSE TESTING     No current facility-administered medications for this visit.   Facility-Administered Medications Ordered in Other Visits  Medication Dose Route Frequency Provider Last Rate Last Admin  . 0.9 %  sodium chloride infusion   Intravenous Once Curt Bears, MD      . acetaminophen (TYLENOL) tablet 650 mg  650 mg Oral Once Curt Bears, MD      . daratumumab [facility]) 1,400 mg in sodium chloride 0.9 % 430 mL (2.8 mg/mL) chemo infusion  16 mg/kg (Treatment Plan Recorded) Intravenous Once Curt Bears, MD      . diphenhydrAMINE (BENADRYL) capsule 50 mg  50 mg Oral Once Curt Bears, MD      . heparin lock flush 100 unit/mL  500 Units Intracatheter Once PRN Curt Bears, MD      . methylPREDNISolone sodium succinate (SOLU-MEDROL) 125 mg/2 mL injection 100 mg  100 mg Intravenous Once Curt Bears, MD      . sodium chloride flush (NS) 0.9 % injection 10 mL  10 mL Intracatheter PRN Curt Bears, MD   10 mL at 02/22/20 1127  . sodium chloride flush (NS) 0.9 % injection 10 mL  10 mL Intracatheter PRN Curt Bears, MD        SURGICAL HISTORY:  Past Surgical History:  Procedure Laterality Date  . IR IMAGING GUIDED PORT INSERTION  05/10/2019    REVIEW OF SYSTEMS:   Review of Systems  Constitutional: Negative for appetite change, chills, fatigue, fever and unexpected weight change.  HENT: Negative for mouth sores, nosebleeds, sore throat and trouble swallowing.   Eyes: Negative for eye problems and icterus.  Respiratory: Positive for shortness of breath with bending over. Negative for cough, hemoptysis, and wheezing.   Cardiovascular: Negative for chest pain and leg swelling.  Gastrointestinal: Negative for abdominal pain, constipation, diarrhea, nausea and vomiting.  Genitourinary: Negative for bladder incontinence, difficulty urinating, dysuria,  frequency and hematuria.   Musculoskeletal: Negative for back pain, gait problem, neck pain and neck stiffness.  Skin: Positive for itching without rash.  Neurological: Negative for dizziness, extremity weakness, gait problem, headaches, light-headedness and seizures.  Hematological: Negative for adenopathy. Does not bruise/bleed easily.  Psychiatric/Behavioral: Negative for confusion, depression and sleep disturbance. The patient is not nervous/anxious.     PHYSICAL EXAMINATION:  Blood pressure 134/86, pulse (!) 106, temperature (!) 97.4 F (36.3 C), temperature source Tympanic, resp. rate 18, height _0  (1.727 m), weight 181 lb 14.4 oz (82.5 kg), SpO2 100 %.  ECOG PERFORMANCE STATUS: 1 - Symptomatic but completely ambulatory  Physical Exam  Constitutional: Oriented to person, place, and time and well-developed, well-nourished, and in no distress.  HENT:  Head: Normocephalic and atraumatic.  Mouth/Throat: Oropharynx is clear and moist. No oropharyngeal exudate.  Eyes: Conjunctivae are normal. Right eye exhibits  no discharge. Left eye exhibits no discharge. No scleral icterus.  Neck: Normal range of motion. Neck supple.  Cardiovascular: Normal rate, regular rhythm, normal heart sounds and intact distal pulses.   Pulmonary/Chest: Effort normal and breath sounds normal. No respiratory distress. No wheezes. No rales.  Abdominal: Soft. Bowel sounds are normal. Exhibits no distension and no mass. There is no tenderness.  Musculoskeletal: Normal range of motion. Exhibits no edema.  Lymphadenopathy:    No cervical adenopathy.  Neurological: Alert and oriented to person, place, and time. Exhibits normal muscle tone. Gait normal. Coordination normal.  Skin: Skin is warm and dry. No rash noted. Not diaphoretic. No erythema. No pallor.  Psychiatric: Mood, memory and judgment normal.  Vitals reviewed.  LABORATORY DATA: Lab Results  Component Value Date   WBC 3.4 (L) 07/19/2020   HGB 10.2  (L) 07/19/2020   HCT 30.0 (L) 07/19/2020   MCV 85.7 07/19/2020   PLT 48 (L) 07/19/2020      Chemistry      Component Value Date/Time   NA 136 07/19/2020 1040   NA 132 (L) 04/17/2020 1612   K 4.2 07/19/2020 1040   CL 103 07/19/2020 1040   CO2 25 07/19/2020 1040   BUN 34 (H) 07/19/2020 1040   BUN 46 (H) 04/17/2020 1612   CREATININE 1.94 (H) 07/19/2020 1040      Component Value Date/Time   CALCIUM 8.5 (L) 07/19/2020 1040   ALKPHOS 69 07/19/2020 1040   AST 14 (L) 07/19/2020 1040   ALT 10 07/19/2020 1040   BILITOT 0.4 07/19/2020 1040       RADIOGRAPHIC STUDIES:  No results found.   ASSESSMENT/PLAN:  This is a very pleasant 70 year old African female from [place] who was visiting her son in [place] and was diagnosed with IgA multiple myeloma in 2019.   The patient was a started initially on treatment with weekly subcutaneous Velcade 1.3 mg/M2, Revlimid 25 mg p.o. daily for 21 days every 4 weeks as well as weekly Decadron 40 mg orally. She was tolerating the treatment well but she developed significant skin rash secondary to treatment with Revlimid and this was discontinued.  The patient continued treatment with subcutaneous Velcade and Decadron.She developed evidence of disease progression in February 2021. Her treatment was discontinued.  The patient was then started on Pomalyst 4 mg p.o. daily for 21 days on every 4 weeks with weekly Decadron 40 mg p.o. She is status post 6 days of treatment. She developed a significant skin rash/reaction to Pomalyst.This was discontinued.  The patient was then undergoing treatment withCarfilzomibon days 1, 2, 8, 9, 15, and 16 and Cytoxan on days 1, 8, and 15 IV every 4 weeks with 40 mg p.o. weekly of Decadron.She is status post11cycles.This was discontinued due to intolerance.   The patient was recently hospitalized for colitis, COVID-19, acute kidney injury, thrombocytopenia, and heart failure. Unclear as  to the etiology of the heart failure cannot exclude cardiotoxicity from carfilzomib.    She started treatment with daratumumab and Decadron status post 2 cycles.  The patient was seen with Dr. Julien Nordmann today. I reviewed her symptoms with him. Labs were reviewed.   Recommend that she proceed with cycle #3 today as scheduled.   We will see her back for a follow up visit in 4 weeks for evaluation before starting cycle #4.   I will arrange for a repeat myeloma panel before her next appointment.   For her shortness of breath with bending over, given  her history of a pleural effusion and CHF, I will arrange for a chest x-ray to be performed today. I did not appreciate any abnormalities on exam today and her oxygen saturation was 100%. No significant lower extremity swelling either.   For the itching, Dr. Julien Nordmann recommended that she continue to take benadryl.   She would like copies of her medical records in case she goes back to [place]. I re-sent the mychart activation to her son to make an account for her. All of her notes are available on mychart.   The patient was advised to call immediately if she has any concerning symptoms in the interval. The patient voices understanding of current disease status and treatment options and is in agreement with the current care plan. All questions were answered. The patient knows to call the clinic with any problems, questions or concerns. We can certainly see the patient much sooner if necessary     Orders Placed This Encounter  Procedures  . DG Chest 2 View    Standing Status:   Future    Standing Expiration Date:   07/19/2021    Order Specific Question:   Reason for Exam (SYMPTOM  OR DIAGNOSIS REQUIRED)    Answer:   Shortness of breath with bending. Hx fluid retention. Check for fluid or other causes of shortness of breath    Order Specific Question:   Preferred imaging location?    Answer:   [facility]  . Lactate dehydrogenase     Standing Status:   Future    Standing Expiration Date:   07/19/2021  . Beta 2 microglobulin    Standing Status:   Future    Standing Expiration Date:   07/19/2021  . Kappa/lambda light chains    Standing Status:   Future    Standing Expiration Date:   07/19/2021  . QIG  (Quant. immunoglobulins  - IgG, IgA, IgM)    Standing Status:   Future    Standing Expiration Date:   07/19/2021      Tobe Sos Charleton Deyoung, PA-C 07/19/20  ADDENDUM: Hematology/Oncology Attending: I had a face-to-face encounter with the patient today.  I reviewed her records and recommended her care plan.  This is a very pleasant 70 years old African female originally from [place] and diagnosed with multiple myeloma status post several chemotherapy regimens and she is currently on daratumumab every 2 weeks in addition to Decadron.  She is status post 2 cycles.  She has been tolerating her treatment well except for fatigue and itching. I recommended for her to proceed with cycle #3 today as planned. For the itching she was advised to take Benadryl over-the-counter. We will see the patient back for follow-up visit in 4 weeks for evaluation with repeat myeloma panel. She was advised to call immediately if she has any other concerning symptoms in the interval.  Disclaimer: This note was dictated with voice recognition software. Similar sounding words can inadvertently be transcribed and may be missed upon review. Eilleen Kempf, MD 07/19/20

## 2020-07-19 ENCOUNTER — Inpatient Hospital Stay (HOSPITAL_BASED_OUTPATIENT_CLINIC_OR_DEPARTMENT_OTHER): Payer: Self-pay | Admitting: Physician Assistant

## 2020-07-19 ENCOUNTER — Inpatient Hospital Stay: Payer: Self-pay

## 2020-07-19 ENCOUNTER — Other Ambulatory Visit: Payer: Self-pay

## 2020-07-19 ENCOUNTER — Ambulatory Visit (HOSPITAL_COMMUNITY)
Admission: RE | Admit: 2020-07-19 | Discharge: 2020-07-19 | Disposition: A | Discharge status: Home / Self Care | Payer: Self-pay | Source: Ambulatory Visit | Attending: Physician Assistant | Admitting: Physician Assistant

## 2020-07-19 ENCOUNTER — Encounter: Payer: Self-pay | Admitting: Physician Assistant

## 2020-07-19 VITALS — BP 140/80 | HR 106 | Resp 16

## 2020-07-19 VITALS — BP 134/86 | HR 106 | Temp 97.4°F | Resp 18 | Ht 68.0 in | Wt 181.9 lb

## 2020-07-19 DIAGNOSIS — R0602 Shortness of breath: Secondary | ICD-10-CM

## 2020-07-19 DIAGNOSIS — C9000 Multiple myeloma not having achieved remission: Secondary | ICD-10-CM | POA: Insufficient documentation

## 2020-07-19 DIAGNOSIS — Z5111 Encounter for antineoplastic chemotherapy: Secondary | ICD-10-CM

## 2020-07-19 DIAGNOSIS — Z95828 Presence of other vascular implants and grafts: Secondary | ICD-10-CM

## 2020-07-19 LAB — CBC WITH DIFFERENTIAL (CANCER CENTER ONLY)
Abs Immature Granulocytes: 0 10*3/uL (ref 0.00–0.07)
Basophils Absolute: 0 10*3/uL (ref 0.0–0.1)
Basophils Relative: 1 %
Eosinophils Absolute: 0.1 10*3/uL (ref 0.0–0.5)
Eosinophils Relative: 4 %
HCT: 30 % — ABNORMAL LOW (ref 36.0–46.0)
Hemoglobin: 10.2 g/dL — ABNORMAL LOW (ref 12.0–15.0)
Immature Granulocytes: 0 %
Lymphocytes Relative: 33 %
Lymphs Abs: 1.1 10*3/uL (ref 0.7–4.0)
MCH: 29.1 pg (ref 26.0–34.0)
MCHC: 34 g/dL (ref 30.0–36.0)
MCV: 85.7 fL (ref 80.0–100.0)
Monocytes Absolute: 0.4 10*3/uL (ref 0.1–1.0)
Monocytes Relative: 13 %
Neutro Abs: 1.7 10*3/uL (ref 1.7–7.7)
Neutrophils Relative %: 49 %
Platelet Count: 48 10*3/uL — ABNORMAL LOW (ref 150–400)
RBC: 3.5 MIL/uL — ABNORMAL LOW (ref 3.87–5.11)
RDW: 16.2 % — ABNORMAL HIGH (ref 11.5–15.5)
WBC Count: 3.4 10*3/uL — ABNORMAL LOW (ref 4.0–10.5)
nRBC: 0 % (ref 0.0–0.2)

## 2020-07-19 LAB — CMP (CANCER CENTER ONLY)
ALT: 10 U/L (ref 0–44)
AST: 14 U/L — ABNORMAL LOW (ref 15–41)
Albumin: 3.4 g/dL — ABNORMAL LOW (ref 3.5–5.0)
Alkaline Phosphatase: 69 U/L (ref 38–126)
Anion gap: 8 (ref 5–15)
BUN: 34 mg/dL — ABNORMAL HIGH (ref 8–23)
CO2: 25 mmol/L (ref 22–32)
Calcium: 8.5 mg/dL — ABNORMAL LOW (ref 8.9–10.3)
Chloride: 103 mmol/L (ref 98–111)
Creatinine: 1.94 mg/dL — ABNORMAL HIGH (ref 0.44–1.00)
GFR, Estimated: 27 mL/min — ABNORMAL LOW (ref >60)
Glucose, Bld: 115 mg/dL — ABNORMAL HIGH (ref 70–99)
Potassium: 4.2 mmol/L (ref 3.5–5.1)
Sodium: 136 mmol/L (ref 135–145)
Total Bilirubin: 0.4 mg/dL (ref 0.3–1.2)
Total Protein: 5.4 g/dL — ABNORMAL LOW (ref 6.5–8.1)

## 2020-07-19 MED ORDER — ACETAMINOPHEN 325 MG PO TABS
650.0000 mg | ORAL_TABLET | Freq: Once | ORAL | Status: AC
  Administered 2020-07-19: 650 mg via ORAL

## 2020-07-19 MED ORDER — SODIUM CHLORIDE 0.9% FLUSH
10.0000 mL | INTRAVENOUS | Status: DC | PRN
  Administered 2020-07-19: 10 mL
  Filled 2020-07-19: qty 10

## 2020-07-19 MED ORDER — SODIUM CHLORIDE 0.9 % IV SOLN
16.0000 mg/kg | Freq: Once | INTRAVENOUS | Status: AC
  Administered 2020-07-19: 1400 mg via INTRAVENOUS
  Filled 2020-07-19: qty 60

## 2020-07-19 MED ORDER — HEPARIN SOD (PORK) LOCK FLUSH 100 UNIT/ML IV SOLN
500.0000 [IU] | Freq: Once | INTRAVENOUS | Status: AC | PRN
  Administered 2020-07-19: 500 [IU]
  Filled 2020-07-19: qty 5

## 2020-07-19 MED ORDER — DIPHENHYDRAMINE HCL 25 MG PO CAPS
50.0000 mg | ORAL_CAPSULE | Freq: Once | ORAL | Status: AC
  Administered 2020-07-19: 50 mg via ORAL

## 2020-07-19 MED ORDER — METHYLPREDNISOLONE SODIUM SUCC 125 MG IJ SOLR
100.0000 mg | Freq: Once | INTRAMUSCULAR | Status: AC
  Administered 2020-07-19: 100 mg via INTRAVENOUS

## 2020-07-19 MED ORDER — SODIUM CHLORIDE 0.9 % IV SOLN
Freq: Once | INTRAVENOUS | Status: AC
  Filled 2020-07-19: qty 250

## 2020-07-19 NOTE — Progress Notes (Unsigned)
Per Cassie Heilingoetter, PA-C ok to treat with elevated HR, low platelets, and elevated creatinine.

## 2020-07-19 NOTE — Patient Instructions (Addendum)
[facility]   Discharge Instructions: Thank you for choosing [facility] to provide your oncology and hematology care.   If you have a lab appointment with the Cancer Center, please go directly to the Cancer Center and check in at the registration area.   Wear comfortable clothing and clothing appropriate for easy access to any Portacath or PICC line.   We strive to give you quality time with your provider. You may need to reschedule your appointment if you arrive late (15 or more minutes).  Arriving late affects you and other patients whose appointments are after yours.  Also, if you miss three or more appointments without notifying the office, you may be dismissed from the clinic at the provider's discretion.      For prescription refill requests, have your pharmacy contact our office and allow 72 hours for refills to be completed.    Today you received the following chemotherapy and/or immunotherapy agents: daratumumab      To help prevent nausea and vomiting after your treatment, we encourage you to take your nausea medication as directed.  BELOW ARE SYMPTOMS THAT SHOULD BE REPORTED IMMEDIATELY: *FEVER GREATER THAN 100.4 F (38 C) OR HIGHER *CHILLS OR SWEATING *NAUSEA AND VOMITING THAT IS NOT CONTROLLED WITH YOUR NAUSEA MEDICATION *UNUSUAL SHORTNESS OF BREATH *UNUSUAL BRUISING OR BLEEDING *URINARY PROBLEMS (pain or burning when urinating, or frequent urination) *BOWEL PROBLEMS (unusual diarrhea, constipation, pain near the anus) TENDERNESS IN MOUTH AND THROAT WITH OR WITHOUT PRESENCE OF ULCERS (sore throat, sores in mouth, or a toothache) UNUSUAL RASH, SWELLING OR PAIN  UNUSUAL VAGINAL DISCHARGE OR ITCHING   Items with * indicate a potential emergency and should be followed up as soon as possible or go to the Emergency Department if any problems should occur.  Please show the CHEMOTHERAPY ALERT CARD or IMMUNOTHERAPY ALERT CARD at check-in  to the Emergency Department and triage nurse.  Should you have questions after your visit or need to cancel or reschedule your appointment, please contact [facility]  Dept: [phone]  and follow the prompts.  Office hours are 8:00 a.m. to 4:30 p.m. Monday - Friday. Please note that voicemails left after 4:00 p.m. may not be returned until the following business day.  We are closed weekends and major holidays. You have access to a nurse at all times for urgent questions. Please call the main number to the clinic Dept: [phone] and follow the prompts.   For any non-urgent questions, you may also contact your provider using MyChart. We now offer e-Visits for anyone 18 and older to request care online for non-urgent symptoms. For details visit [email or web address].   Also download the MyChart app! Go to the app store, search "MyChart", open the app, select [facility], and log in with your MyChart username and password.  Due to Covid, a mask is required upon entering the hospital/clinic. If you do not have a mask, one will be given to you upon arrival. For doctor visits, patients may have 1 support person aged 18 or older with them. For treatment visits, patients cannot have anyone with them due to current Covid guidelines and our immunocompromised population.   

## 2020-07-20 ENCOUNTER — Telehealth: Payer: Self-pay | Admitting: Physician Assistant

## 2020-07-20 ENCOUNTER — Encounter: Payer: Self-pay | Admitting: Internal Medicine

## 2020-07-20 ENCOUNTER — Other Ambulatory Visit (HOSPITAL_COMMUNITY): Payer: Self-pay

## 2020-07-20 NOTE — Telephone Encounter (Signed)
I called the patient's son to let him know that his mom's CXR did not show any recurrent pleural effusions or infections. He expressed understanding.

## 2020-07-24 ENCOUNTER — Encounter: Payer: Self-pay | Admitting: Physician Assistant

## 2020-07-25 ENCOUNTER — Telehealth: Payer: Self-pay | Admitting: Internal Medicine

## 2020-07-25 NOTE — Telephone Encounter (Signed)
Scheduled appts per 5/26 los. Spoke to pt's son who is aware of appts dates and times.

## 2020-07-25 NOTE — Progress Notes (Signed)
This encounter was created in error - please disregard.

## 2020-07-28 ENCOUNTER — Other Ambulatory Visit (HOSPITAL_COMMUNITY): Payer: Self-pay

## 2020-07-28 ENCOUNTER — Other Ambulatory Visit (HOSPITAL_COMMUNITY): Payer: Self-pay | Admitting: Family Medicine

## 2020-07-30 ENCOUNTER — Encounter: Payer: Self-pay | Admitting: Internal Medicine

## 2020-07-30 ENCOUNTER — Other Ambulatory Visit (HOSPITAL_COMMUNITY): Payer: Self-pay

## 2020-07-30 ENCOUNTER — Other Ambulatory Visit (HOSPITAL_COMMUNITY): Payer: Self-pay | Admitting: Family Medicine

## 2020-07-30 ENCOUNTER — Other Ambulatory Visit (HOSPITAL_COMMUNITY): Payer: Self-pay | Admitting: Unknown Physician Specialty

## 2020-07-30 MED ORDER — AMLODIPINE BESYLATE 2.5 MG PO TABS
ORAL_TABLET | Freq: Every day | ORAL | 3 refills | Status: DC
  Filled 2020-07-30: qty 30, 30d supply, fill #0
  Filled 2020-08-29: qty 30, 30d supply, fill #1
  Filled 2020-09-13: qty 30, 30d supply, fill #2
  Filled 2020-10-11: qty 30, 30d supply, fill #3

## 2020-07-30 MED ORDER — ISOSORBIDE MONONITRATE ER 30 MG PO TB24
ORAL_TABLET | Freq: Every day | ORAL | 3 refills | Status: DC
  Filled 2020-07-30: qty 30, 30d supply, fill #0
  Filled 2020-08-29: qty 30, 30d supply, fill #1
  Filled 2020-09-13: qty 30, 30d supply, fill #2
  Filled 2020-10-11: qty 30, 30d supply, fill #3

## 2020-07-30 MED ORDER — CARVEDILOL 12.5 MG PO TABS
ORAL_TABLET | Freq: Two times a day (BID) | ORAL | 3 refills | Status: DC
  Filled 2020-07-30 – 2020-11-27 (×2): qty 60, 30d supply, fill #0
  Filled 2020-12-27: qty 60, 30d supply, fill #1
  Filled 2021-02-01: qty 60, 30d supply, fill #2
  Filled 2021-02-27: qty 60, 30d supply, fill #3

## 2020-07-30 MED ORDER — FUROSEMIDE 40 MG PO TABS
ORAL_TABLET | Freq: Two times a day (BID) | ORAL | 3 refills | Status: DC
  Filled 2020-07-30: qty 60, 30d supply, fill #0
  Filled 2020-08-29: qty 60, 30d supply, fill #1
  Filled 2020-10-13: qty 60, 30d supply, fill #2

## 2020-07-30 MED ORDER — CARVEDILOL 12.5 MG PO TABS
ORAL_TABLET | Freq: Two times a day (BID) | ORAL | 3 refills | Status: DC
  Filled 2020-07-30: qty 60, 30d supply, fill #0
  Filled 2020-08-29: qty 60, 30d supply, fill #1
  Filled 2020-09-13: qty 60, 30d supply, fill #2
  Filled 2020-10-11: qty 60, 30d supply, fill #3

## 2020-07-30 MED ORDER — HYDRALAZINE HCL 100 MG PO TABS
ORAL_TABLET | Freq: Three times a day (TID) | ORAL | 3 refills | Status: DC
  Filled 2020-07-30: qty 90, 30d supply, fill #0
  Filled 2020-08-29: qty 90, 30d supply, fill #1
  Filled 2020-09-13: qty 90, 30d supply, fill #2
  Filled 2020-10-11: qty 90, 30d supply, fill #3

## 2020-08-03 ENCOUNTER — Other Ambulatory Visit: Payer: Self-pay

## 2020-08-03 ENCOUNTER — Inpatient Hospital Stay: Payer: Self-pay

## 2020-08-03 ENCOUNTER — Inpatient Hospital Stay: Payer: Self-pay | Attending: Internal Medicine

## 2020-08-03 ENCOUNTER — Other Ambulatory Visit (HOSPITAL_COMMUNITY): Payer: Self-pay

## 2020-08-03 VITALS — BP 129/81 | HR 102 | Temp 98.0°F | Resp 18 | Wt 180.5 lb

## 2020-08-03 DIAGNOSIS — M25552 Pain in left hip: Secondary | ICD-10-CM | POA: Insufficient documentation

## 2020-08-03 DIAGNOSIS — J90 Pleural effusion, not elsewhere classified: Secondary | ICD-10-CM | POA: Insufficient documentation

## 2020-08-03 DIAGNOSIS — C9000 Multiple myeloma not having achieved remission: Secondary | ICD-10-CM | POA: Insufficient documentation

## 2020-08-03 DIAGNOSIS — Z95828 Presence of other vascular implants and grafts: Secondary | ICD-10-CM

## 2020-08-03 DIAGNOSIS — I517 Cardiomegaly: Secondary | ICD-10-CM | POA: Insufficient documentation

## 2020-08-03 DIAGNOSIS — Z5112 Encounter for antineoplastic immunotherapy: Secondary | ICD-10-CM | POA: Insufficient documentation

## 2020-08-03 DIAGNOSIS — Z79899 Other long term (current) drug therapy: Secondary | ICD-10-CM | POA: Insufficient documentation

## 2020-08-03 LAB — CMP (CANCER CENTER ONLY)
ALT: 9 U/L (ref 0–44)
AST: 16 U/L (ref 15–41)
Albumin: 3.4 g/dL — ABNORMAL LOW (ref 3.5–5.0)
Alkaline Phosphatase: 70 U/L (ref 38–126)
Anion gap: 11 (ref 5–15)
BUN: 34 mg/dL — ABNORMAL HIGH (ref 8–23)
CO2: 23 mmol/L (ref 22–32)
Calcium: 8.8 mg/dL — ABNORMAL LOW (ref 8.9–10.3)
Chloride: 102 mmol/L (ref 98–111)
Creatinine: 2.42 mg/dL — ABNORMAL HIGH (ref 0.44–1.00)
GFR, Estimated: 21 mL/min — ABNORMAL LOW (ref >60)
Glucose, Bld: 120 mg/dL — ABNORMAL HIGH (ref 70–99)
Potassium: 4.1 mmol/L (ref 3.5–5.1)
Sodium: 136 mmol/L (ref 135–145)
Total Bilirubin: 0.6 mg/dL (ref 0.3–1.2)
Total Protein: 5.6 g/dL — ABNORMAL LOW (ref 6.5–8.1)

## 2020-08-03 LAB — CBC WITH DIFFERENTIAL (CANCER CENTER ONLY)
Abs Immature Granulocytes: 0 10*3/uL (ref 0.00–0.07)
Basophils Absolute: 0 10*3/uL (ref 0.0–0.1)
Basophils Relative: 1 %
Eosinophils Absolute: 0.1 10*3/uL (ref 0.0–0.5)
Eosinophils Relative: 3 %
HCT: 32.4 % — ABNORMAL LOW (ref 36.0–46.0)
Hemoglobin: 11.1 g/dL — ABNORMAL LOW (ref 12.0–15.0)
Immature Granulocytes: 0 %
Lymphocytes Relative: 35 %
Lymphs Abs: 1.3 10*3/uL (ref 0.7–4.0)
MCH: 29.1 pg (ref 26.0–34.0)
MCHC: 34.3 g/dL (ref 30.0–36.0)
MCV: 85 fL (ref 80.0–100.0)
Monocytes Absolute: 0.6 10*3/uL (ref 0.1–1.0)
Monocytes Relative: 15 %
Neutro Abs: 1.8 10*3/uL (ref 1.7–7.7)
Neutrophils Relative %: 46 %
Platelet Count: 52 10*3/uL — ABNORMAL LOW (ref 150–400)
RBC: 3.81 MIL/uL — ABNORMAL LOW (ref 3.87–5.11)
RDW: 15.4 % (ref 11.5–15.5)
WBC Count: 3.8 10*3/uL — ABNORMAL LOW (ref 4.0–10.5)
nRBC: 0 % (ref 0.0–0.2)

## 2020-08-03 LAB — LACTATE DEHYDROGENASE: LDH: 228 U/L — ABNORMAL HIGH (ref 98–192)

## 2020-08-03 MED ORDER — METHYLPREDNISOLONE SODIUM SUCC 125 MG IJ SOLR
100.0000 mg | Freq: Once | INTRAMUSCULAR | Status: AC
  Administered 2020-08-03: 100 mg via INTRAVENOUS

## 2020-08-03 MED ORDER — METHYLPREDNISOLONE SODIUM SUCC 125 MG IJ SOLR
INTRAMUSCULAR | Status: AC
  Filled 2020-08-03: qty 2

## 2020-08-03 MED ORDER — SODIUM CHLORIDE 0.9% FLUSH
10.0000 mL | INTRAVENOUS | Status: DC | PRN
  Administered 2020-08-03: 10 mL
  Filled 2020-08-03: qty 10

## 2020-08-03 MED ORDER — DIPHENHYDRAMINE HCL 25 MG PO CAPS
50.0000 mg | ORAL_CAPSULE | Freq: Once | ORAL | Status: AC
Start: 2020-08-03 — End: 2020-08-03
  Administered 2020-08-03: 50 mg via ORAL

## 2020-08-03 MED ORDER — SODIUM CHLORIDE 0.9 % IV SOLN
16.0000 mg/kg | Freq: Once | INTRAVENOUS | Status: AC
  Administered 2020-08-03: 1400 mg via INTRAVENOUS
  Filled 2020-08-03: qty 60

## 2020-08-03 MED ORDER — ACETAMINOPHEN 325 MG PO TABS
650.0000 mg | ORAL_TABLET | Freq: Once | ORAL | Status: AC
  Administered 2020-08-03: 650 mg via ORAL

## 2020-08-03 MED ORDER — DIPHENHYDRAMINE HCL 25 MG PO CAPS
ORAL_CAPSULE | ORAL | Status: AC
  Filled 2020-08-03: qty 2

## 2020-08-03 MED ORDER — SODIUM CHLORIDE 0.9 % IV SOLN
Freq: Once | INTRAVENOUS | Status: AC
  Filled 2020-08-03: qty 250

## 2020-08-03 MED ORDER — ACETAMINOPHEN 325 MG PO TABS
ORAL_TABLET | ORAL | Status: AC
  Filled 2020-08-03: qty 2

## 2020-08-03 MED ORDER — HEPARIN SOD (PORK) LOCK FLUSH 100 UNIT/ML IV SOLN
500.0000 [IU] | Freq: Once | INTRAVENOUS | Status: AC | PRN
  Administered 2020-08-03: 500 [IU]
  Filled 2020-08-03: qty 5

## 2020-08-03 NOTE — Progress Notes (Signed)
Per Dr Julien Nordmann, ok to treat with platelets of 52 and creatinine of 2.42.

## 2020-08-03 NOTE — Patient Instructions (Signed)
[facility]   Discharge Instructions: Thank you for choosing [facility] to provide your oncology and hematology care.   If you have a lab appointment with the Cancer Center, please go directly to the Cancer Center and check in at the registration area.   Wear comfortable clothing and clothing appropriate for easy access to any Portacath or PICC line.   We strive to give you quality time with your provider. You may need to reschedule your appointment if you arrive late (15 or more minutes).  Arriving late affects you and other patients whose appointments are after yours.  Also, if you miss three or more appointments without notifying the office, you may be dismissed from the clinic at the provider's discretion.      For prescription refill requests, have your pharmacy contact our office and allow 72 hours for refills to be completed.    Today you received the following chemotherapy and/or immunotherapy agents: daratumumab      To help prevent nausea and vomiting after your treatment, we encourage you to take your nausea medication as directed.  BELOW ARE SYMPTOMS THAT SHOULD BE REPORTED IMMEDIATELY: *FEVER GREATER THAN 100.4 F (38 C) OR HIGHER *CHILLS OR SWEATING *NAUSEA AND VOMITING THAT IS NOT CONTROLLED WITH YOUR NAUSEA MEDICATION *UNUSUAL SHORTNESS OF BREATH *UNUSUAL BRUISING OR BLEEDING *URINARY PROBLEMS (pain or burning when urinating, or frequent urination) *BOWEL PROBLEMS (unusual diarrhea, constipation, pain near the anus) TENDERNESS IN MOUTH AND THROAT WITH OR WITHOUT PRESENCE OF ULCERS (sore throat, sores in mouth, or a toothache) UNUSUAL RASH, SWELLING OR PAIN  UNUSUAL VAGINAL DISCHARGE OR ITCHING   Items with * indicate a potential emergency and should be followed up as soon as possible or go to the Emergency Department if any problems should occur.  Please show the CHEMOTHERAPY ALERT CARD or IMMUNOTHERAPY ALERT CARD at check-in  to the Emergency Department and triage nurse.  Should you have questions after your visit or need to cancel or reschedule your appointment, please contact [facility]  Dept: [phone]  and follow the prompts.  Office hours are 8:00 a.m. to 4:30 p.m. Monday - Friday. Please note that voicemails left after 4:00 p.m. may not be returned until the following business day.  We are closed weekends and major holidays. You have access to a nurse at all times for urgent questions. Please call the main number to the clinic Dept: [phone] and follow the prompts.   For any non-urgent questions, you may also contact your provider using MyChart. We now offer e-Visits for anyone 18 and older to request care online for non-urgent symptoms. For details visit [email or web address].   Also download the MyChart app! Go to the app store, search "MyChart", open the app, select [facility], and log in with your MyChart username and password.  Due to Covid, a mask is required upon entering the hospital/clinic. If you do not have a mask, one will be given to you upon arrival. For doctor visits, patients may have 1 support person aged 18 or older with them. For treatment visits, patients cannot have anyone with them due to current Covid guidelines and our immunocompromised population.   

## 2020-08-04 LAB — IGG, IGA, IGM
IgA: 185 mg/dL (ref 87–352)
IgG (Immunoglobin G), Serum: 302 mg/dL — ABNORMAL LOW (ref 586–1602)
IgM (Immunoglobulin M), Srm: 13 mg/dL — ABNORMAL LOW (ref 26–217)

## 2020-08-04 LAB — BETA 2 MICROGLOBULIN, SERUM: Beta-2 Microglobulin: 6.7 mg/L — ABNORMAL HIGH (ref 0.6–2.4)

## 2020-08-06 LAB — KAPPA/LAMBDA LIGHT CHAINS
Kappa free light chain: 11.2 mg/L (ref 3.3–19.4)
Kappa, lambda light chain ratio: 0.05 — ABNORMAL LOW (ref 0.26–1.65)
Lambda free light chains: 213.9 mg/L — ABNORMAL HIGH (ref 5.7–26.3)

## 2020-08-16 ENCOUNTER — Inpatient Hospital Stay: Payer: Self-pay

## 2020-08-16 ENCOUNTER — Inpatient Hospital Stay (HOSPITAL_BASED_OUTPATIENT_CLINIC_OR_DEPARTMENT_OTHER): Payer: Self-pay | Admitting: Internal Medicine

## 2020-08-16 ENCOUNTER — Encounter: Payer: Self-pay | Admitting: Internal Medicine

## 2020-08-16 ENCOUNTER — Other Ambulatory Visit: Payer: Self-pay

## 2020-08-16 ENCOUNTER — Other Ambulatory Visit (HOSPITAL_COMMUNITY): Payer: Self-pay

## 2020-08-16 VITALS — BP 140/83 | HR 102 | Temp 97.6°F | Resp 18

## 2020-08-16 VITALS — BP 114/81 | HR 102 | Temp 97.0°F | Resp 18 | Ht 68.0 in | Wt 181.6 lb

## 2020-08-16 DIAGNOSIS — C9000 Multiple myeloma not having achieved remission: Secondary | ICD-10-CM

## 2020-08-16 DIAGNOSIS — K219 Gastro-esophageal reflux disease without esophagitis: Secondary | ICD-10-CM

## 2020-08-16 DIAGNOSIS — Z95828 Presence of other vascular implants and grafts: Secondary | ICD-10-CM

## 2020-08-16 DIAGNOSIS — Z5111 Encounter for antineoplastic chemotherapy: Secondary | ICD-10-CM

## 2020-08-16 LAB — CBC WITH DIFFERENTIAL (CANCER CENTER ONLY)
Abs Immature Granulocytes: 0 10*3/uL (ref 0.00–0.07)
Basophils Absolute: 0 10*3/uL (ref 0.0–0.1)
Basophils Relative: 1 %
Eosinophils Absolute: 0.1 10*3/uL (ref 0.0–0.5)
Eosinophils Relative: 3 %
HCT: 31.6 % — ABNORMAL LOW (ref 36.0–46.0)
Hemoglobin: 10.9 g/dL — ABNORMAL LOW (ref 12.0–15.0)
Immature Granulocytes: 0 %
Lymphocytes Relative: 32 %
Lymphs Abs: 1.3 10*3/uL (ref 0.7–4.0)
MCH: 29.1 pg (ref 26.0–34.0)
MCHC: 34.5 g/dL (ref 30.0–36.0)
MCV: 84.5 fL (ref 80.0–100.0)
Monocytes Absolute: 0.5 10*3/uL (ref 0.1–1.0)
Monocytes Relative: 12 %
Neutro Abs: 2.1 10*3/uL (ref 1.7–7.7)
Neutrophils Relative %: 52 %
Platelet Count: 54 10*3/uL — ABNORMAL LOW (ref 150–400)
RBC: 3.74 MIL/uL — ABNORMAL LOW (ref 3.87–5.11)
RDW: 14.7 % (ref 11.5–15.5)
WBC Count: 3.9 10*3/uL — ABNORMAL LOW (ref 4.0–10.5)
nRBC: 0 % (ref 0.0–0.2)

## 2020-08-16 LAB — CMP (CANCER CENTER ONLY)
ALT: 10 U/L (ref 0–44)
AST: 18 U/L (ref 15–41)
Albumin: 3.3 g/dL — ABNORMAL LOW (ref 3.5–5.0)
Alkaline Phosphatase: 67 U/L (ref 38–126)
Anion gap: 9 (ref 5–15)
BUN: 37 mg/dL — ABNORMAL HIGH (ref 8–23)
CO2: 23 mmol/L (ref 22–32)
Calcium: 8.5 mg/dL — ABNORMAL LOW (ref 8.9–10.3)
Chloride: 102 mmol/L (ref 98–111)
Creatinine: 2.33 mg/dL — ABNORMAL HIGH (ref 0.44–1.00)
GFR, Estimated: 22 mL/min — ABNORMAL LOW (ref >60)
Glucose, Bld: 109 mg/dL — ABNORMAL HIGH (ref 70–99)
Potassium: 4.1 mmol/L (ref 3.5–5.1)
Sodium: 134 mmol/L — ABNORMAL LOW (ref 135–145)
Total Bilirubin: 0.6 mg/dL (ref 0.3–1.2)
Total Protein: 5.5 g/dL — ABNORMAL LOW (ref 6.5–8.1)

## 2020-08-16 MED ORDER — OMEPRAZOLE 20 MG PO CPDR
DELAYED_RELEASE_CAPSULE | Freq: Every day | ORAL | 4 refills | Status: DC
  Filled 2020-08-16: qty 30, 30d supply, fill #0
  Filled 2020-09-13: qty 30, 30d supply, fill #1
  Filled 2020-10-11: qty 30, 30d supply, fill #2
  Filled 2020-11-23: qty 30, 30d supply, fill #3
  Filled 2020-12-12: qty 30, 30d supply, fill #4

## 2020-08-16 MED ORDER — SODIUM CHLORIDE 0.9 % IV SOLN
Freq: Once | INTRAVENOUS | Status: AC
  Filled 2020-08-16: qty 250

## 2020-08-16 MED ORDER — SODIUM CHLORIDE 0.9% FLUSH
10.0000 mL | INTRAVENOUS | Status: DC | PRN
Start: 2020-08-16 — End: 2020-08-16
  Administered 2020-08-16: 10 mL
  Filled 2020-08-16: qty 10

## 2020-08-16 MED ORDER — ACETAMINOPHEN 325 MG PO TABS
650.0000 mg | ORAL_TABLET | Freq: Once | ORAL | Status: AC
  Administered 2020-08-16: 650 mg via ORAL

## 2020-08-16 MED ORDER — METHYLPREDNISOLONE SODIUM SUCC 125 MG IJ SOLR
100.0000 mg | Freq: Once | INTRAMUSCULAR | Status: AC
Start: 2020-08-16 — End: 2020-08-16
  Administered 2020-08-16: 100 mg via INTRAVENOUS

## 2020-08-16 MED ORDER — HEPARIN SOD (PORK) LOCK FLUSH 100 UNIT/ML IV SOLN
500.0000 [IU] | Freq: Once | INTRAVENOUS | Status: AC | PRN
  Administered 2020-08-16: 500 [IU]
  Filled 2020-08-16: qty 5

## 2020-08-16 MED ORDER — ACETAMINOPHEN 325 MG PO TABS
ORAL_TABLET | ORAL | Status: AC
  Filled 2020-08-16: qty 2

## 2020-08-16 MED ORDER — SODIUM CHLORIDE 0.9 % IV SOLN
16.0000 mg/kg | Freq: Once | INTRAVENOUS | Status: AC
  Administered 2020-08-16: 1400 mg via INTRAVENOUS
  Filled 2020-08-16: qty 60

## 2020-08-16 MED ORDER — METHYLPREDNISOLONE SODIUM SUCC 125 MG IJ SOLR
INTRAMUSCULAR | Status: AC
  Filled 2020-08-16: qty 2

## 2020-08-16 MED ORDER — DIPHENHYDRAMINE HCL 25 MG PO CAPS
50.0000 mg | ORAL_CAPSULE | Freq: Once | ORAL | Status: AC
  Administered 2020-08-16: 50 mg via ORAL

## 2020-08-16 MED ORDER — DIPHENHYDRAMINE HCL 25 MG PO CAPS
ORAL_CAPSULE | ORAL | Status: AC
  Filled 2020-08-16: qty 2

## 2020-08-16 MED ORDER — SODIUM CHLORIDE 0.9% FLUSH
10.0000 mL | INTRAVENOUS | Status: DC | PRN
  Administered 2020-08-16: 10 mL
  Filled 2020-08-16: qty 10

## 2020-08-16 NOTE — Progress Notes (Signed)
Per Cassandra Heilingoetter, PA-C - okay to proceed with elevated heart rate.

## 2020-08-16 NOTE — Progress Notes (Signed)
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  PRIOR THERAPY: 1) Systemic therapy with Velcade 1.3 mg/M2 weekly, Revlimid 25 mg p.o. daily for 14 days every 3 weeks in addition to weekly Decadron 40 mg orally. First dose of treatment 10/08/2017.  Treatment was placed on hold due to development of a significant rash.  She resumed treatment with only dexamethasone and Velcade on 11/19/2017. This was discontinued on 04/14/2019 due to evidence of disease progression.  2) Pomalyst 4 mg p.o. for 21 days every 4 weeks and 40 mg p.o. Decadron once a week. First dose 04/29/2019-05/04/2019. Status post 6 days of treatment. Discontinued secondary to allergy to Pomalyst.  3) Chemotherapy with carfilzomib on days 1, 2, 8, 9, 15, and 16 and Cytoxan 300 mg/m2 on days 1, 8, and 15 IV every 4 weeks and 40 mg p.o. weekly of Decadron. Last dose expected on 03/02/2020. Status post 11 cycles. Discontinued due to intolerance  CURRENT THERAPY: Chemotherapy with Daratumumab on days 1, 8, 15, and 22 IV every 4 weeks and 20 mg p.o. weekly of Decadron. First dose on 05/17/2020.  Starting from cycle #3 she is on treatment every 2 weeks.  Status post 3 cycles.  INTERVAL HISTORY: Debbie Chan 70 y.o. female returns to the clinic today for follow-up visit.  The patient is feeling fine today with no concerning complaints except for intermittent pain in the left hip area improved with Tylenol.  She also has some itching in the skin and eye.  She denied having any current chest pain, shortness of breath, cough or hemoptysis.  She denied having any fever or chills.  She has no nausea, vomiting, diarrhea or constipation.  She denied having any headache or visual changes.  She has no significant weight loss or night sweats.  She continues to tolerate her treatment  with daratumumab and Decadron fairly well.  She had repeat myeloma panel performed recently and she is here for evaluation and discussion of her lab results and recommendation regarding her condition.   MEDICAL HISTORY: Past Medical History:  Diagnosis Date   Cancer ([facility])     ALLERGIES:  has No Known Allergies.  MEDICATIONS:  Current Outpatient Medications  Medication Sig Dispense Refill   acetaminophen (TYLENOL) 325 MG tablet Take 2 tablets (650 mg total) by mouth every 6 (six) hours as needed for mild pain (or Fever >/= 101).     acyclovir (ZOVIRAX) 200 MG capsule TAKE 1 CAPSULE BY MOUTH TWICE DAILY 60 capsule 4   amLODipine (NORVASC) 2.5 MG tablet TAKE 1 TABLET BY MOUTH ONCE A DAY 30 tablet 3   azithromycin (ZITHROMAX Z-PAK) 250 MG tablet Take 2 tablets day 1 and then 1 tablet daily days 2 through 5 (Patient taking differently: Take 2 tablets day 1 and then 1 tablet daily days 2 through 5) 6 each 0   benzonatate (TESSALON) 100 MG capsule Take 1 capsule (100 mg total) by mouth 3 (three) times daily as needed for cough. 21 capsule 0   blood glucose meter kit and supplies Dispense based on patient and insurance preference. Use up to four times daily as directed. (FOR ICD-10 E10.9, E11.9). 1 each 0   carvedilol (COREG) 12.5 MG tablet TAKE 1 TABLET BY MOUTH 2 TIMES DAILY WITH A MEAL 60 tablet 3   carvedilol (COREG) 12.5 MG  tablet TAKE 1 TABLET BY MOUTH 2 TIMES DAILY WITH A MEAL 60 tablet 3   dexamethasone 20 MG TABS Please take 1  tablet once a week on the day of treatment 30 tablet 2   ferrous sulfate 325 (65 FE) MG tablet TAKE 1 TABLET BY MOUTH ONCE A DAY WITH BREAKFAST 30 tablet 3   furosemide (LASIX) 40 MG tablet TAKE 1 TABLET BY MOUTH 2 TIMES DAILY 60 tablet 3   hydrALAZINE (APRESOLINE) 100 MG tablet TAKE 1 TABLET BY MOUTH EVERY 8 HOURS 90 tablet 3   isosorbide mononitrate (IMDUR) 30 MG 24 hr tablet TAKE 1 TABLET BY MOUTH ONCE A DAY 30 tablet 3   lidocaine-prilocaine (EMLA) cream Apply  topically as needed 30 g 1   loratadine (CLARITIN) 10 MG tablet Take 10 mg by mouth daily.     MELATONIN PO Take 10 mg by mouth at bedtime as needed (sleep).     omeprazole (PRILOSEC) 20 MG capsule TAKE 1 CAPSULE BY MOUTH ONCE DAILY 30 capsule 4   ReliOn Ultra Thin Lancets 30G MISC USE 1 TO CHECK GLUCOSE ONCE DAILY FOR GLUCOSE TESTING     No current facility-administered medications for this visit.   Facility-Administered Medications Ordered in Other Visits  Medication Dose Route Frequency Provider Last Rate Last Admin   sodium chloride flush (NS) 0.9 % injection 10 mL  10 mL Intracatheter PRN Curt Bears, MD   10 mL at 02/22/20 1127   sodium chloride flush (NS) 0.9 % injection 10 mL  10 mL Intracatheter PRN Curt Bears, MD   10 mL at 07/19/20 1630   sodium chloride flush (NS) 0.9 % injection 10 mL  10 mL Intracatheter PRN Curt Bears, MD   10 mL at 08/16/20 0748    SURGICAL HISTORY:  Past Surgical History:  Procedure Laterality Date   IR IMAGING GUIDED PORT INSERTION  05/10/2019    REVIEW OF SYSTEMS:  Constitutional: positive for fatigue Eyes: negative Ears, nose, mouth, throat, and face: negative Respiratory: negative Cardiovascular: negative Gastrointestinal: negative Genitourinary:negative Integument/breast: positive for pruritus Hematologic/lymphatic: negative Musculoskeletal:positive for arthralgias Neurological: negative Behavioral/Psych: negative Endocrine: negative Allergic/Immunologic: negative   PHYSICAL EXAMINATION: General appearance: alert, cooperative, fatigued, and no distress Head: Normocephalic, without obvious abnormality, atraumatic Neck: no adenopathy, no JVD, supple, symmetrical, trachea midline, and thyroid not enlarged, symmetric, no tenderness/mass/nodules Lymph nodes: Cervical, supraclavicular, and axillary nodes normal. Resp: clear to auscultation bilaterally Back: symmetric, no curvature. ROM normal. No CVA tenderness. Cardio:  regular rate and rhythm, S1, S2 normal, no murmur, click, rub or gallop GI: soft, non-tender; bowel sounds normal; no masses,  no organomegaly Extremities: extremities normal, atraumatic, no cyanosis or edema Neurologic: Alert and oriented X 3, normal strength and tone. Normal symmetric reflexes. Normal coordination and gait  ECOG PERFORMANCE STATUS: 1 - Symptomatic but completely ambulatory  Blood pressure 114/81, pulse (!) 102, temperature (!) 97 F (36.1 C), temperature source Tympanic, resp. rate 18, height '5\' 8"'  (1.727 m), weight 181 lb 9.6 oz (82.4 kg), SpO2 100 %.  LABORATORY DATA: Lab Results  Component Value Date   WBC 3.8 (L) 08/03/2020   HGB 11.1 (L) 08/03/2020   HCT 32.4 (L) 08/03/2020   MCV 85.0 08/03/2020   PLT 52 (L) 08/03/2020      Chemistry      Component Value Date/Time   NA 136 08/03/2020 0745   NA 132 (L) 04/17/2020 1612   K 4.1 08/03/2020 0745   CL 102 08/03/2020 0745   CO2 23  08/03/2020 0745   BUN 34 (H) 08/03/2020 0745   BUN 46 (H) 04/17/2020 1612   CREATININE 2.42 (H) 08/03/2020 0745      Component Value Date/Time   CALCIUM 8.8 (L) 08/03/2020 0745   ALKPHOS 70 08/03/2020 0745   AST 16 08/03/2020 0745   ALT 9 08/03/2020 0745   BILITOT 0.6 08/03/2020 0745       RADIOGRAPHIC STUDIES: DG Chest 2 View  Result Date: 07/19/2020 CLINICAL DATA:  Shortness of breath EXAM: CHEST - 2 VIEW COMPARISON:  03/29/2020 FINDINGS: Right-sided central venous port tip over the SVC. Cardiomegaly with trace bilateral pleural effusions. No consolidation or pneumothorax. Heterogenous lucencies in the clavicles presumably corresponds to history of myeloma IMPRESSION: Cardiomegaly with trace bilateral pleural effusions. Electronically Signed   By: Donavan Foil M.D.   On: 07/19/2020 17:39    ASSESSMENT AND PLAN: This is a very pleasant 70 years old African female originally from [place] who is visiting her son in [place] and was recently diagnosed with IgA  multiple myeloma. The patient was a started initially on treatment with weekly subcutaneous Velcade 1.3 mg/M2, Revlimid 25 mg p.o. daily for 21 days every 4 weeks as well as weekly Decadron 40 mg orally.  Revlimid was discontinued secondary to hypersensitivity reaction with significant skin rash.  She is currently on treatment with weekly Velcade and Decadron. Her treatment was discontinued secondary to disease progression.  The patient started treatment with Pomalyst and Decadron but this was discontinued secondary to hypersensitivity reaction to the Pomalyst. She underwent systemic chemotherapy with carfilzomib, Cytoxan and Decadron status post 11 cycles. The patient tolerated the previous treatment well but this was discontinued secondary to disease progression as well as recent hospitalization with COVID-19 infection and pneumonia.  She is recovering well but she has not received any treatment for the last 6 weeks or more. She started treatment with daratumumab and Decadron status post 3 cycles.  The patient continues to tolerate this treatment well with no concerning complaints except for fatigue and itching. She had repeat myeloma panel performed recently.  I discussed the lab results with the patient.  Her myeloma panel showed significant improvement in her disease with decrease in the IgA as well as the free lambda light chain. I recommended for the patient to continue her current treatment with daratumumab and Decadron every 2 weeks. She will come back for follow-up visit in 4 weeks for evaluation before starting cycle #5. The patient was advised to call immediately if she has any concerning symptoms in the interval. The patient voices understanding of current disease status and treatment options and is in agreement with the current care plan.  All questions were answered. The patient knows to call the clinic with any problems, questions or concerns. We can certainly see the patient much sooner  if necessary.   Disclaimer: This note was dictated with voice recognition software. Similar sounding words can inadvertently be transcribed and may not be corrected upon review.

## 2020-08-16 NOTE — Progress Notes (Signed)
Per MD Julien Nordmann, ok to treat with CBC today

## 2020-08-16 NOTE — Patient Instructions (Signed)
[facility]   Discharge Instructions: Thank you for choosing [facility] to provide your oncology and hematology care.   If you have a lab appointment with the Cancer Center, please go directly to the Cancer Center and check in at the registration area.   Wear comfortable clothing and clothing appropriate for easy access to any Portacath or PICC line.   We strive to give you quality time with your provider. You may need to reschedule your appointment if you arrive late (15 or more minutes).  Arriving late affects you and other patients whose appointments are after yours.  Also, if you miss three or more appointments without notifying the office, you may be dismissed from the clinic at the provider's discretion.      For prescription refill requests, have your pharmacy contact our office and allow 72 hours for refills to be completed.    Today you received the following chemotherapy and/or immunotherapy agents: Daratumumab (Darzalex)      To help prevent nausea and vomiting after your treatment, we encourage you to take your nausea medication as directed.  BELOW ARE SYMPTOMS THAT SHOULD BE REPORTED IMMEDIATELY: *FEVER GREATER THAN 100.4 F (38 C) OR HIGHER *CHILLS OR SWEATING *NAUSEA AND VOMITING THAT IS NOT CONTROLLED WITH YOUR NAUSEA MEDICATION *UNUSUAL SHORTNESS OF BREATH *UNUSUAL BRUISING OR BLEEDING *URINARY PROBLEMS (pain or burning when urinating, or frequent urination) *BOWEL PROBLEMS (unusual diarrhea, constipation, pain near the anus) TENDERNESS IN MOUTH AND THROAT WITH OR WITHOUT PRESENCE OF ULCERS (sore throat, sores in mouth, or a toothache) UNUSUAL RASH, SWELLING OR PAIN  UNUSUAL VAGINAL DISCHARGE OR ITCHING   Items with * indicate a potential emergency and should be followed up as soon as possible or go to the Emergency Department if any problems should occur.  Please show the CHEMOTHERAPY ALERT CARD or IMMUNOTHERAPY ALERT CARD  at check-in to the Emergency Department and triage nurse.  Should you have questions after your visit or need to cancel or reschedule your appointment, please contact [facility]  Dept: [phone]  and follow the prompts.  Office hours are 8:00 a.m. to 4:30 p.m. Monday - Friday. Please note that voicemails left after 4:00 p.m. may not be returned until the following business day.  We are closed weekends and major holidays. You have access to a nurse at all times for urgent questions. Please call the main number to the clinic Dept: [phone] and follow the prompts.   For any non-urgent questions, you may also contact your provider using MyChart. We now offer e-Visits for anyone 18 and older to request care online for non-urgent symptoms. For details visit [email or web address].   Also download the MyChart app! Go to the app store, search "MyChart", open the app, select [facility], and log in with your MyChart username and password.  Due to Covid, a mask is required upon entering the hospital/clinic. If you do not have a mask, one will be given to you upon arrival. For doctor visits, patients may have 1 support person aged 18 or older with them. For treatment visits, patients cannot have anyone with them due to current Covid guidelines and our immunocompromised population.   

## 2020-08-17 ENCOUNTER — Telehealth: Payer: Self-pay

## 2020-08-17 NOTE — Telephone Encounter (Signed)
Error not needed

## 2020-08-28 ENCOUNTER — Telehealth: Payer: Self-pay | Admitting: *Deleted

## 2020-08-28 NOTE — Telephone Encounter (Signed)
   Patient Name: Debbie Chan  DOB: Jul 30, 1950  MRN: [id number]   Primary Cardiologist: Donato Heinz, MD  Chart reviewed as part of pre-operative protocol coverage. Cataract extractions are recognized in guidelines as low risk surgeries that do not typically require specific preoperative testing or holding of blood thinner therapy. Therefore, given past medical history and time since last visit, based on ACC/AHA guidelines, Amylynn Fano would be at acceptable risk for the planned procedure without further cardiovascular testing.   I will route this recommendation to the requesting party via Epic fax function and remove from pre-op pool.  Please call with questions.  Deberah Pelton, NP 08/28/2020, 3:13 PM

## 2020-08-28 NOTE — Telephone Encounter (Signed)
   [facility] Pre-operative Risk Assessment    Patient Name: Debbie Chan  DOB: 10-09-1950  MRN: [id number]   HEARTCARE STAFF: - Please ensure there is not already an duplicate clearance open for this procedure. - Under Visit Info/Reason for Call, type in Other and utilize the format Clearance MM/DD/YY or Clearance TBD. Do not use dashes or single digits. - If request is for dental extraction, please clarify the # of teeth to be extracted. - If the patient is currently at the dentist's office, call Pre-Op APP to address. If the patient is not currently in the dentist office, please route to the Pre-Op pool  Request for surgical clearance:  What type of surgery is being performed? CATARACT EXTRACTION BY [facility], IOL-LEFT EYE   When is this surgery scheduled? 09/10/20   What type of clearance is required (medical clearance vs. Pharmacy clearance to hold med vs. Both)? MEDICAL  Are there any medications that need to be held prior to surgery and how long? NON NEED TO HOLD ANY MEDICATIONS, INCLUDING BLOOD THINNERS PER DR. Harrell Gave [facility]   Practice name and name of physician performing surgery? [facility]; DR. Tama High   What is the office phone number? [phone]   9.   What is the office fax number? [phone]  8.   Anesthesia type (None, local, MAC, general) ? IV SEDATION   Julaine Hua 08/28/2020, 2:31 PM  _________________________________________________________________   (provider comments below)

## 2020-08-29 ENCOUNTER — Other Ambulatory Visit (HOSPITAL_COMMUNITY): Payer: Self-pay

## 2020-08-29 ENCOUNTER — Encounter: Payer: Self-pay | Admitting: Internal Medicine

## 2020-08-30 ENCOUNTER — Other Ambulatory Visit: Payer: Self-pay | Admitting: Internal Medicine

## 2020-08-30 ENCOUNTER — Other Ambulatory Visit (HOSPITAL_COMMUNITY): Payer: Self-pay

## 2020-08-30 ENCOUNTER — Inpatient Hospital Stay: Payer: Self-pay | Attending: Internal Medicine

## 2020-08-30 ENCOUNTER — Other Ambulatory Visit: Payer: Self-pay

## 2020-08-30 ENCOUNTER — Inpatient Hospital Stay: Payer: Self-pay

## 2020-08-30 VITALS — BP 134/85 | HR 103 | Temp 97.4°F | Resp 18 | Wt 179.5 lb

## 2020-08-30 DIAGNOSIS — Z79899 Other long term (current) drug therapy: Secondary | ICD-10-CM | POA: Insufficient documentation

## 2020-08-30 DIAGNOSIS — M7989 Other specified soft tissue disorders: Secondary | ICD-10-CM | POA: Insufficient documentation

## 2020-08-30 DIAGNOSIS — C9000 Multiple myeloma not having achieved remission: Secondary | ICD-10-CM | POA: Insufficient documentation

## 2020-08-30 DIAGNOSIS — I509 Heart failure, unspecified: Secondary | ICD-10-CM | POA: Insufficient documentation

## 2020-08-30 DIAGNOSIS — M25559 Pain in unspecified hip: Secondary | ICD-10-CM | POA: Insufficient documentation

## 2020-08-30 DIAGNOSIS — R21 Rash and other nonspecific skin eruption: Secondary | ICD-10-CM | POA: Insufficient documentation

## 2020-08-30 DIAGNOSIS — Z5112 Encounter for antineoplastic immunotherapy: Secondary | ICD-10-CM | POA: Insufficient documentation

## 2020-08-30 DIAGNOSIS — Z95828 Presence of other vascular implants and grafts: Secondary | ICD-10-CM

## 2020-08-30 LAB — CMP (CANCER CENTER ONLY)
ALT: 9 U/L (ref 0–44)
AST: 17 U/L (ref 15–41)
Albumin: 3.4 g/dL — ABNORMAL LOW (ref 3.5–5.0)
Alkaline Phosphatase: 74 U/L (ref 38–126)
Anion gap: 10 (ref 5–15)
BUN: 32 mg/dL — ABNORMAL HIGH (ref 8–23)
CO2: 24 mmol/L (ref 22–32)
Calcium: 8.8 mg/dL — ABNORMAL LOW (ref 8.9–10.3)
Chloride: 102 mmol/L (ref 98–111)
Creatinine: 2.43 mg/dL — ABNORMAL HIGH (ref 0.44–1.00)
GFR, Estimated: 21 mL/min — ABNORMAL LOW (ref >60)
Glucose, Bld: 130 mg/dL — ABNORMAL HIGH (ref 70–99)
Potassium: 4.2 mmol/L (ref 3.5–5.1)
Sodium: 136 mmol/L (ref 135–145)
Total Bilirubin: 0.6 mg/dL (ref 0.3–1.2)
Total Protein: 5.6 g/dL — ABNORMAL LOW (ref 6.5–8.1)

## 2020-08-30 LAB — CBC WITH DIFFERENTIAL (CANCER CENTER ONLY)
Abs Immature Granulocytes: 0 10*3/uL (ref 0.00–0.07)
Basophils Absolute: 0 10*3/uL (ref 0.0–0.1)
Basophils Relative: 1 %
Eosinophils Absolute: 0.1 10*3/uL (ref 0.0–0.5)
Eosinophils Relative: 3 %
HCT: 32.9 % — ABNORMAL LOW (ref 36.0–46.0)
Hemoglobin: 11.3 g/dL — ABNORMAL LOW (ref 12.0–15.0)
Immature Granulocytes: 0 %
Lymphocytes Relative: 28 %
Lymphs Abs: 1.1 10*3/uL (ref 0.7–4.0)
MCH: 28.8 pg (ref 26.0–34.0)
MCHC: 34.3 g/dL (ref 30.0–36.0)
MCV: 83.7 fL (ref 80.0–100.0)
Monocytes Absolute: 0.4 10*3/uL (ref 0.1–1.0)
Monocytes Relative: 11 %
Neutro Abs: 2.3 10*3/uL (ref 1.7–7.7)
Neutrophils Relative %: 57 %
Platelet Count: 56 10*3/uL — ABNORMAL LOW (ref 150–400)
RBC: 3.93 MIL/uL (ref 3.87–5.11)
RDW: 14.6 % (ref 11.5–15.5)
WBC Count: 3.9 10*3/uL — ABNORMAL LOW (ref 4.0–10.5)
nRBC: 0 % (ref 0.0–0.2)

## 2020-08-30 MED ORDER — HYDROCODONE-ACETAMINOPHEN 5-325 MG PO TABS: 1.0000 | ORAL_TABLET | Freq: Three times a day (TID) | ORAL | 0 refills | Status: DC | PRN

## 2020-08-30 MED ORDER — DIPHENHYDRAMINE HCL 25 MG PO CAPS
50.0000 mg | ORAL_CAPSULE | Freq: Once | ORAL | Status: AC
  Administered 2020-08-30: 50 mg via ORAL

## 2020-08-30 MED ORDER — HEPARIN SOD (PORK) LOCK FLUSH 100 UNIT/ML IV SOLN
500.0000 [IU] | Freq: Once | INTRAVENOUS | Status: AC | PRN
Start: 2020-08-30 — End: 2020-08-30
  Administered 2020-08-30: 500 [IU]
  Filled 2020-08-30: qty 5

## 2020-08-30 MED ORDER — SODIUM CHLORIDE 0.9% FLUSH
10.0000 mL | INTRAVENOUS | Status: DC | PRN
  Administered 2020-08-30: 10 mL
  Filled 2020-08-30: qty 10

## 2020-08-30 MED ORDER — DIPHENHYDRAMINE HCL 25 MG PO CAPS
ORAL_CAPSULE | ORAL | Status: AC
  Filled 2020-08-30: qty 2

## 2020-08-30 MED ORDER — METHYLPREDNISOLONE SODIUM SUCC 125 MG IJ SOLR
100.0000 mg | Freq: Once | INTRAMUSCULAR | Status: AC
  Administered 2020-08-30: 100 mg via INTRAVENOUS

## 2020-08-30 MED ORDER — ACETAMINOPHEN 325 MG PO TABS
650.0000 mg | ORAL_TABLET | Freq: Once | ORAL | Status: AC
Start: 2020-08-30 — End: 2020-08-30
  Administered 2020-08-30: 650 mg via ORAL

## 2020-08-30 MED ORDER — SODIUM CHLORIDE 0.9 % IV SOLN
Freq: Once | INTRAVENOUS | Status: AC
Start: 2020-08-30 — End: 2020-08-30
  Filled 2020-08-30: qty 250

## 2020-08-30 MED ORDER — SODIUM CHLORIDE 0.9 % IV SOLN
16.0000 mg/kg | Freq: Once | INTRAVENOUS | Status: AC
  Administered 2020-08-30: 1400 mg via INTRAVENOUS
  Filled 2020-08-30: qty 60

## 2020-08-30 MED ORDER — ACETAMINOPHEN 325 MG PO TABS
ORAL_TABLET | ORAL | Status: AC
  Filled 2020-08-30: qty 2

## 2020-08-30 MED ORDER — METHYLPREDNISOLONE SODIUM SUCC 125 MG IJ SOLR
INTRAMUSCULAR | Status: AC
  Filled 2020-08-30: qty 2

## 2020-08-30 NOTE — Progress Notes (Signed)
Per Dr. Julien Nordmann ok to treat with low platelets

## 2020-08-30 NOTE — Patient Instructions (Signed)
[facility]  Discharge Instructions: Thank you for choosing [facility] to provide your oncology and hematology care.   If you have a lab appointment with the Cancer Center, please go directly to the Cancer Center and check in at the registration area.   Wear comfortable clothing and clothing appropriate for easy access to any Portacath or PICC line.   We strive to give you quality time with your provider. You may need to reschedule your appointment if you arrive late (15 or more minutes).  Arriving late affects you and other patients whose appointments are after yours.  Also, if you miss three or more appointments without notifying the office, you may be dismissed from the clinic at the provider's discretion.      For prescription refill requests, have your pharmacy contact our office and allow 72 hours for refills to be completed.    Today you received the following chemotherapy and/or immunotherapy agents darzalex      To help prevent nausea and vomiting after your treatment, we encourage you to take your nausea medication as directed.  BELOW ARE SYMPTOMS THAT SHOULD BE REPORTED IMMEDIATELY: *FEVER GREATER THAN 100.4 F (38 C) OR HIGHER *CHILLS OR SWEATING *NAUSEA AND VOMITING THAT IS NOT CONTROLLED WITH YOUR NAUSEA MEDICATION *UNUSUAL SHORTNESS OF BREATH *UNUSUAL BRUISING OR BLEEDING *URINARY PROBLEMS (pain or burning when urinating, or frequent urination) *BOWEL PROBLEMS (unusual diarrhea, constipation, pain near the anus) TENDERNESS IN MOUTH AND THROAT WITH OR WITHOUT PRESENCE OF ULCERS (sore throat, sores in mouth, or a toothache) UNUSUAL RASH, SWELLING OR PAIN  UNUSUAL VAGINAL DISCHARGE OR ITCHING   Items with * indicate a potential emergency and should be followed up as soon as possible or go to the Emergency Department if any problems should occur.  Please show the CHEMOTHERAPY ALERT CARD or IMMUNOTHERAPY ALERT CARD at check-in to  the Emergency Department and triage nurse.  Should you have questions after your visit or need to cancel or reschedule your appointment, please contact [facility]  Dept: [phone]  and follow the prompts.  Office hours are 8:00 a.m. to 4:30 p.m. Monday - Friday. Please note that voicemails left after 4:00 p.m. may not be returned until the following business day.  We are closed weekends and major holidays. You have access to a nurse at all times for urgent questions. Please call the main number to the clinic Dept: [phone] and follow the prompts.   For any non-urgent questions, you may also contact your provider using MyChart. We now offer e-Visits for anyone 18 and older to request care online for non-urgent symptoms. For details visit [email or web address].   Also download the MyChart app! Go to the app store, search "MyChart", open the app, select [facility], and log in with your MyChart username and password.  Due to Covid, a mask is required upon entering the hospital/clinic. If you do not have a mask, one will be given to you upon arrival. For doctor visits, patients may have 1 support person aged 18 or older with them. For treatment visits, patients cannot have anyone with them due to current Covid guidelines and our immunocompromised population.  

## 2020-09-10 NOTE — Progress Notes (Signed)
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  PRIOR THERAPY: 1) Systemic therapy with Velcade 1.3 mg/M2 weekly, Revlimid 25 mg p.o. daily for 14 days every 3 weeks in addition to weekly Decadron 40 mg orally. First dose of treatment 10/08/2017.  Treatment was placed on hold due to development of a significant rash.  She resumed treatment with only dexamethasone and Velcade on 11/19/2017. This was discontinued on 04/14/2019 due to evidence of disease progression.  2) Pomalyst 4 mg p.o. for 21 days every 4 weeks and 40 mg p.o. Decadron once a week. First dose 04/29/2019-05/04/2019. Status post 6 days of treatment. Discontinued secondary to allergy to Pomalyst.  3) Chemotherapy with carfilzomib on days 1, 2, 8, 9, 15, and 16 and Cytoxan 300 mg/m2 on days 1, 8, and 15 IV every 4 weeks and 40 mg p.o. weekly of Decadron. Last dose expected on 03/02/2020. Status post 11 cycles. Discontinued due to intolerance  CURRENT THERAPY: Chemotherapy with Daratumumab on days 1, 8, 15, and 22 IV every 4 weeks and 20 mg p.o. weekly of Decadron. First dose on 05/17/2020.  Status post 4 cycles.  INTERVAL HISTORY: Debbie Chan 70 y.o. female returns to the clinic today for a follow-up visit. The patient is feeling unwell today because she is having generalized itching and it kept her up last night. She reports itching round back, chest, arms, and lower extremities. No rash seen on upper body except for dry skin. However, she does have a significant bilateral lower extremity rash/skin darkening/itching, and with a shiny appearance. This itching is very troubling to her and started about 2 weeks ago. She denies any new lotions/detergents. She has been using Vaseline. She also has a history of heart failure for which she is prescribed lasix. She also endorses L hip pain which is likely due to her myeloma.  She was given a prescription for norco on 08/30/20 which she has not picked up yet. She denies any recent fever, chills, or night sweats. She denies unexplained weight loss. She denies any abnormal bleeding or bruising.  She denies any recent infections including upper respiratory infection skin or burning with urination.  The patient is here today for evaluation before starting cycle #5 of treatment.    MEDICAL HISTORY: Past Medical History:  Diagnosis Date   Cancer ([place])     ALLERGIES:  has No Known Allergies.  MEDICATIONS:  Current Outpatient Medications  Medication Sig Dispense Refill   hydrocortisone 2.5 % cream Apply topically 2 (two) times daily as needed. 454 g 0   hydrOXYzine (ATARAX/VISTARIL) 10 MG tablet Take 1 tablet (10 mg total) by mouth at bedtime. 30 tablet 0   acyclovir (ZOVIRAX) 200 MG capsule TAKE 1 CAPSULE BY MOUTH TWICE DAILY 60 capsule 4   amLODipine (NORVASC) 2.5 MG tablet TAKE 1 TABLET BY MOUTH ONCE A DAY 30 tablet 3   azithromycin (ZITHROMAX Z-PAK) 250 MG tablet Take 2 tablets day 1 and then 1 tablet daily days 2 through 5 (Patient taking differently: Take 2 tablets day 1 and then 1 tablet daily days 2 through 5) 6 each 0   benzonatate (TESSALON) 100 MG capsule Take 1 capsule (100 mg total) by mouth 3 (three) times daily as needed for cough. 21 capsule 0   blood glucose meter kit and supplies Dispense based on patient and insurance preference. Use up to four times daily as  directed. (FOR ICD-10 E10.9, E11.9). 1 each 0   carvedilol (COREG) 12.5 MG tablet TAKE 1 TABLET BY MOUTH 2 TIMES DAILY WITH A MEAL 60 tablet 3   carvedilol (COREG) 12.5 MG tablet TAKE 1 TABLET BY MOUTH 2 TIMES DAILY WITH A MEAL 60 tablet 3   dexamethasone 20 MG TABS Please take 1  tablet once a week on the day of treatment 30 tablet 2   ferrous sulfate 325 (65 FE) MG tablet TAKE 1 TABLET BY MOUTH ONCE A DAY WITH BREAKFAST 30 tablet 3   furosemide (LASIX) 40 MG tablet TAKE 1 TABLET BY MOUTH 2 TIMES  DAILY 60 tablet 3   hydrALAZINE (APRESOLINE) 100 MG tablet TAKE 1 TABLET BY MOUTH EVERY 8 HOURS 90 tablet 3   HYDROcodone-acetaminophen (NORCO) 5-325 MG tablet Take 1 tablet by mouth every 6 (six) hours as needed for moderate pain. 30 tablet 0   isosorbide mononitrate (IMDUR) 30 MG 24 hr tablet TAKE 1 TABLET BY MOUTH ONCE A DAY 30 tablet 3   lidocaine-prilocaine (EMLA) cream Apply topically as needed 30 g 1   loratadine (CLARITIN) 10 MG tablet Take 10 mg by mouth daily.     MELATONIN PO Take 10 mg by mouth at bedtime as needed (sleep).     omeprazole (PRILOSEC) 20 MG capsule TAKE 1 CAPSULE BY MOUTH ONCE DAILY 30 capsule 4   ReliOn Ultra Thin Lancets 30G MISC USE 1 TO CHECK GLUCOSE ONCE DAILY FOR GLUCOSE TESTING     No current facility-administered medications for this visit.   Facility-Administered Medications Ordered in Other Visits  Medication Dose Route Frequency Provider Last Rate Last Admin   sodium chloride flush (NS) 0.9 % injection 10 mL  10 mL Intracatheter PRN Curt Bears, MD   10 mL at 02/22/20 1127   sodium chloride flush (NS) 0.9 % injection 10 mL  10 mL Intracatheter PRN Curt Bears, MD   10 mL at 07/19/20 1630    SURGICAL HISTORY:  Past Surgical History:  Procedure Laterality Date   IR IMAGING GUIDED PORT INSERTION  05/10/2019    REVIEW OF SYSTEMS:   Review of Systems Constitutional:Positive for fatigue. Negative for appetite change, chills, fever and unexpected weight change. HENT: Negative for mouth sores, nosebleeds, sore throat and trouble swallowing.   Eyes: Negative for eye problems and icterus. Respiratory: Positive for shortness of breath with bending over. Negative for cough, hemoptysis, and wheezing.   Cardiovascular: Negative for chest pain and leg swelling. Gastrointestinal: Negative for abdominal pain, constipation, diarrhea, nausea and vomiting. Genitourinary: Negative for bladder incontinence, difficulty urinating, dysuria, frequency and  hematuria.   Musculoskeletal: Positive for left hip pain. Negative for back pain, gait problem, neck pain and neck stiffness. Skin: Positive for skin darkening, itching, and a shiny appearance bilaterally on her lower extremities. Generalized itching on upper extremities, chest, and neck.  Neurological: Negative for dizziness, extremity weakness, gait problem, headaches, light-headedness and seizures. Hematological: Negative for adenopathy. Does not bruise/bleed easily. Psychiatric/Behavioral: Negative for confusion, depression and sleep disturbance. The patient is not nervous/anxious.    PHYSICAL EXAMINATION:  Blood pressure 125/80, pulse (!) 103, temperature 97.8 F (36.6 C), temperature source Tympanic, resp. rate 18, height '5\' 8"'  (1.727 m), weight 180 lb 3.2 oz (81.7 kg), SpO2 100 %.  ECOG PERFORMANCE STATUS: 1  Physical Exam  Constitutional: Oriented to person, place, and time and well-developed, well-nourished, and in no distress. HENT: Head: Normocephalic and atraumatic. Mouth/Throat: Oropharynx is clear and moist. No oropharyngeal exudate. Eyes:  Conjunctivae are normal. Right eye exhibits no discharge. Left eye exhibits no discharge. No scleral icterus. Neck: Normal range of motion. Neck supple. Cardiovascular: Normal rate, regular rhythm, normal heart sounds and intact distal pulses.   Pulmonary/Chest: Effort normal and breath sounds normal. No respiratory distress. No wheezes. No rales. Abdominal: Soft. Bowel sounds are normal. Exhibits no distension and no mass. There is no tenderness.  Musculoskeletal: Normal range of motion. Exhibits no edema.  Lymphadenopathy:    No cervical adenopathy.  Neurological: Alert and oriented to person, place, and time. Exhibits normal muscle tone. Gait normal. Coordination normal. Skin: Shiny and darkened appearance of her lower extremities bilaterally with a few scattered skin bumps. No warmth or pain. Skin is warm and dry. Not diaphoretic. No  erythema. No pallor.  Psychiatric: Mood, memory and judgment normal. Vitals reviewed.  LABORATORY DATA: Lab Results  Component Value Date   WBC 4.6 09/13/2020   HGB 10.5 (L) 09/13/2020   HCT 30.6 (L) 09/13/2020   MCV 85.7 09/13/2020   PLT 46 (L) 09/13/2020      Chemistry      Component Value Date/Time   NA 136 08/30/2020 0736   NA 132 (L) 04/17/2020 1612   K 4.2 08/30/2020 0736   CL 102 08/30/2020 0736   CO2 24 08/30/2020 0736   BUN 32 (H) 08/30/2020 0736   BUN 46 (H) 04/17/2020 1612   CREATININE 2.43 (H) 08/30/2020 0736      Component Value Date/Time   CALCIUM 8.8 (L) 08/30/2020 0736   ALKPHOS 74 08/30/2020 0736   AST 17 08/30/2020 0736   ALT 9 08/30/2020 0736   BILITOT 0.6 08/30/2020 0736       RADIOGRAPHIC STUDIES:  No results found.   ASSESSMENT/PLAN:  This is a very pleasant 70 year old African female from [place] who was visiting her son in [place] and was diagnosed with IgA multiple myeloma in 2019.    The patient was a started initially on treatment with weekly subcutaneous Velcade 1.3 mg/M2, Revlimid 25 mg p.o. daily for 21 days every 4 weeks as well as weekly Decadron 40 mg orally.  She was tolerating the treatment well but she developed significant skin rash secondary to treatment with Revlimid and this was discontinued.   The patient continued treatment with subcutaneous Velcade and Decadron.  She developed evidence of disease progression in February 2021.  Her treatment was discontinued.  The patient was then started on Pomalyst 4 mg p.o. daily for 21 days on every 4 weeks with weekly Decadron 40 mg p.o.  She is status post 6 days of treatment.  She developed a significant skin rash/reaction to Pomalyst.  This was discontinued.   The patient was then undergoing treatment with Carfilzomib on days 1, 2, 8, 9, 15, and 16 and Cytoxan on days 1, 8, and 15 IV every 4 weeks with 40 mg p.o. weekly of Decadron. She is status post 11 cycles. This  was discontinued due to intolerance.   The patient was recently hospitalized for colitis, COVID-19, acute kidney injury, thrombocytopenia, and heart failure. Unclear as to the etiology of the heart failure cannot exclude cardiotoxicity from carfilzomib.    She started treatment with daratumumab and Decadron status post 4 cycles.   The patient was seen with Dr. Julien Nordmann today. I reviewed her symptoms with him. Unclear etiology of her rash. Dr. Julien Nordmann does not feel this is due to her treatment with daratubumab. He recommends sending in a prescription for atarax to take  p.o. before bed to help with itching as well as topical hydrocortisone cream for symptomatic relief. She also will use lasix for her mild lower extremity swelling. Dr. Julien Nordmann recommends a referral to dermatology for her significant lower extremity rash as the etiology is unclear and she appears quite uncomfortable. Labs were reviewed.  Recommend that she proceed with cycle #3 today as scheduled.   We will see her back for a follow up visit in 4 weeks for evaluation before starting cycle #4.  For her hip pain, we will refill her norco to the [facility] pharmacy to take once every 6 hours if needed for pain which is likely secondary to bone involvement from her myeloma. Reviewed that norco contains tylenol and to be aware so that she does not to exceed the daily dose of tylenol.    We will call her son to discuss today's recommendations with him as sometimes there is a barrier with accents. I also wrote out a summary of recommendations on her AVS and reviewed it with the patient.    The patient was advised to call immediately if she has any concerning symptoms in the interval. The patient voices understanding of current disease status and treatment options and is in agreement with the current care plan. All questions were answered. The patient knows to call the clinic with any problems, questions or concerns. We can certainly see the patient  much sooner if necessary   Orders Placed This Encounter  Procedures   Ambulatory referral to Dermatology    Referral Priority:   Urgent    Referral Type:   Consultation    Referral Reason:   Specialty Services Required    Requested Specialty:   Dermatology    Number of Visits Requested:   [place], PA-C 09/13/20  ADDENDUM: Hematology/Oncology Attending: I had a face-to-face encounter with the patient today.  I reviewed her records, labs and recommended her care plan.  This is a very pleasant 70 years old African female diagnosed with multiple myeloma in July 2019 status post several chemotherapy regimens and she is currently on treatment with daratumumab every 2 weeks in addition to Decadron.  The patient continues to tolerate this treatment well with no concerning adverse effects. She continues to have persistent rash and skin changes in the lower extremities with mild swelling.  She also has significant pruritus. I recommended for the patient to continue her treatment with daratumumab today as planned. For the skin rash we advised her to apply hydrocortisone 2.5% to the affected area and will also refer her to dermatology for recommendation. For the itching, will start the patient on Atarax to be used on as-needed basis. The patient will come back for follow-up visit in 4 weeks for evaluation before the next cycle of her treatment. She was advised to call immediately if she has any other concerning symptoms in the interval.  Disclaimer: This note was dictated with voice recognition software. Similar sounding words can inadvertently be transcribed and may be missed upon review. Eilleen Kempf, MD 09/13/20

## 2020-09-13 ENCOUNTER — Inpatient Hospital Stay (HOSPITAL_BASED_OUTPATIENT_CLINIC_OR_DEPARTMENT_OTHER): Payer: Self-pay | Admitting: Physician Assistant

## 2020-09-13 ENCOUNTER — Other Ambulatory Visit (HOSPITAL_COMMUNITY): Payer: Self-pay | Admitting: Family Medicine

## 2020-09-13 ENCOUNTER — Ambulatory Visit: Payer: Self-pay

## 2020-09-13 ENCOUNTER — Other Ambulatory Visit: Payer: Self-pay

## 2020-09-13 ENCOUNTER — Encounter: Payer: Self-pay | Admitting: Internal Medicine

## 2020-09-13 ENCOUNTER — Other Ambulatory Visit (HOSPITAL_COMMUNITY): Payer: Self-pay

## 2020-09-13 ENCOUNTER — Inpatient Hospital Stay: Payer: Self-pay

## 2020-09-13 VITALS — BP 141/89 | HR 103 | Temp 98.7°F | Resp 18

## 2020-09-13 VITALS — BP 125/80 | HR 103 | Temp 97.8°F | Resp 18 | Ht 68.0 in | Wt 180.2 lb

## 2020-09-13 DIAGNOSIS — C9000 Multiple myeloma not having achieved remission: Secondary | ICD-10-CM

## 2020-09-13 DIAGNOSIS — R21 Rash and other nonspecific skin eruption: Secondary | ICD-10-CM | POA: Insufficient documentation

## 2020-09-13 DIAGNOSIS — Z95828 Presence of other vascular implants and grafts: Secondary | ICD-10-CM

## 2020-09-13 LAB — CMP (CANCER CENTER ONLY)
ALT: 13 U/L (ref 0–44)
AST: 20 U/L (ref 15–41)
Albumin: 3.6 g/dL (ref 3.5–5.0)
Alkaline Phosphatase: 58 U/L (ref 38–126)
Anion gap: 12 (ref 5–15)
BUN: 36 mg/dL — ABNORMAL HIGH (ref 8–23)
CO2: 25 mmol/L (ref 22–32)
Calcium: 9.1 mg/dL (ref 8.9–10.3)
Chloride: 99 mmol/L (ref 98–111)
Creatinine: 2.22 mg/dL — ABNORMAL HIGH (ref 0.44–1.00)
GFR, Estimated: 23 mL/min — ABNORMAL LOW (ref >60)
Glucose, Bld: 156 mg/dL — ABNORMAL HIGH (ref 70–99)
Potassium: 3.6 mmol/L (ref 3.5–5.1)
Sodium: 136 mmol/L (ref 135–145)
Total Bilirubin: 0.8 mg/dL (ref 0.3–1.2)
Total Protein: 5.6 g/dL — ABNORMAL LOW (ref 6.5–8.1)

## 2020-09-13 LAB — CBC WITH DIFFERENTIAL (CANCER CENTER ONLY)
Abs Immature Granulocytes: 0.01 10*3/uL (ref 0.00–0.07)
Basophils Absolute: 0 10*3/uL (ref 0.0–0.1)
Basophils Relative: 1 %
Eosinophils Absolute: 0.1 10*3/uL (ref 0.0–0.5)
Eosinophils Relative: 3 %
HCT: 30.6 % — ABNORMAL LOW (ref 36.0–46.0)
Hemoglobin: 10.5 g/dL — ABNORMAL LOW (ref 12.0–15.0)
Immature Granulocytes: 0 %
Lymphocytes Relative: 21 %
Lymphs Abs: 1 10*3/uL (ref 0.7–4.0)
MCH: 29.4 pg (ref 26.0–34.0)
MCHC: 34.3 g/dL (ref 30.0–36.0)
MCV: 85.7 fL (ref 80.0–100.0)
Monocytes Absolute: 0.5 10*3/uL (ref 0.1–1.0)
Monocytes Relative: 11 %
Neutro Abs: 3 10*3/uL (ref 1.7–7.7)
Neutrophils Relative %: 64 %
Platelet Count: 46 10*3/uL — ABNORMAL LOW (ref 150–400)
RBC: 3.57 MIL/uL — ABNORMAL LOW (ref 3.87–5.11)
RDW: 14.1 % (ref 11.5–15.5)
WBC Count: 4.6 10*3/uL (ref 4.0–10.5)
nRBC: 0 % (ref 0.0–0.2)

## 2020-09-13 MED ORDER — SODIUM CHLORIDE 0.9 % IV SOLN
16.0000 mg/kg | Freq: Once | INTRAVENOUS | Status: AC
  Administered 2020-09-13: 1400 mg via INTRAVENOUS
  Filled 2020-09-13: qty 60

## 2020-09-13 MED ORDER — SODIUM CHLORIDE 0.9% FLUSH
10.0000 mL | INTRAVENOUS | Status: DC | PRN
  Administered 2020-09-13: 10 mL
  Filled 2020-09-13: qty 10

## 2020-09-13 MED ORDER — METHYLPREDNISOLONE SODIUM SUCC 125 MG IJ SOLR
INTRAMUSCULAR | Status: AC
  Filled 2020-09-13: qty 2

## 2020-09-13 MED ORDER — SODIUM CHLORIDE 0.9% FLUSH
10.0000 mL | INTRAVENOUS | Status: DC | PRN
Start: 2020-09-13 — End: 2020-09-13
  Administered 2020-09-13: 10 mL
  Filled 2020-09-13: qty 10

## 2020-09-13 MED ORDER — HYDROCORTISONE 2.5 % EX CREA
TOPICAL_CREAM | Freq: Two times a day (BID) | CUTANEOUS | 0 refills | Status: DC | PRN
  Filled 2020-09-13: qty 454, 30d supply, fill #0

## 2020-09-13 MED ORDER — DIPHENHYDRAMINE HCL 25 MG PO CAPS
50.0000 mg | ORAL_CAPSULE | Freq: Once | ORAL | Status: AC
  Administered 2020-09-13: 50 mg via ORAL

## 2020-09-13 MED ORDER — METHYLPREDNISOLONE SODIUM SUCC 125 MG IJ SOLR
100.0000 mg | Freq: Once | INTRAMUSCULAR | Status: AC
Start: 2020-09-13 — End: 2020-09-13
  Administered 2020-09-13: 100 mg via INTRAVENOUS

## 2020-09-13 MED ORDER — ACETAMINOPHEN 325 MG PO TABS
ORAL_TABLET | ORAL | Status: AC
  Filled 2020-09-13: qty 2

## 2020-09-13 MED ORDER — HYDROXYZINE HCL 10 MG PO TABS
10.0000 mg | ORAL_TABLET | Freq: Every day | ORAL | 0 refills | Status: DC
  Filled 2020-09-13: qty 30, 30d supply, fill #0

## 2020-09-13 MED ORDER — SODIUM CHLORIDE 0.9 % IV SOLN
Freq: Once | INTRAVENOUS | Status: AC
  Filled 2020-09-13: qty 250

## 2020-09-13 MED ORDER — ACETAMINOPHEN 325 MG PO TABS
650.0000 mg | ORAL_TABLET | Freq: Once | ORAL | Status: AC
  Administered 2020-09-13: 650 mg via ORAL

## 2020-09-13 MED ORDER — HYDROCODONE-ACETAMINOPHEN 5-325 MG PO TABS
1.0000 | ORAL_TABLET | Freq: Four times a day (QID) | ORAL | 0 refills | Status: DC | PRN
  Filled 2020-09-13: qty 30, 8d supply, fill #0

## 2020-09-13 MED ORDER — HEPARIN SOD (PORK) LOCK FLUSH 100 UNIT/ML IV SOLN
500.0000 [IU] | Freq: Once | INTRAVENOUS | Status: AC | PRN
  Administered 2020-09-13: 500 [IU]
  Filled 2020-09-13: qty 5

## 2020-09-13 MED ORDER — DIPHENHYDRAMINE HCL 25 MG PO CAPS
ORAL_CAPSULE | ORAL | Status: AC
  Filled 2020-09-13: qty 2

## 2020-09-13 NOTE — Patient Instructions (Signed)
-  I have sent in a prescription for a steroid cream (hydrocortisone) to use for itching to apply topically.  -I also have also sent a pill to take to help with itching called atarax. Please take this at night time because it can make you tired.  -I have placed a referral to a dermatologist for the rash.  -I have refilled the norco for the back/hip pain. This is a pain medication that you can take every 6 hours if needed. It has tylenol in it so please make sure that you are not exceeding the daily dose of tylenol.

## 2020-09-13 NOTE — Progress Notes (Signed)
OK to treat with HR of 103 and platelets of 46 per Dr. Julien Nordmann.

## 2020-09-13 NOTE — Patient Instructions (Signed)
[facility]  Discharge Instructions: Thank you for choosing [facility] to provide your oncology and hematology care.   If you have a lab appointment with the Cancer Center, please go directly to the Cancer Center and check in at the registration area.   Wear comfortable clothing and clothing appropriate for easy access to any Portacath or PICC line.   We strive to give you quality time with your provider. You may need to reschedule your appointment if you arrive late (15 or more minutes).  Arriving late affects you and other patients whose appointments are after yours.  Also, if you miss three or more appointments without notifying the office, you may be dismissed from the clinic at the provider's discretion.      For prescription refill requests, have your pharmacy contact our office and allow 72 hours for refills to be completed.    Today you received the following chemotherapy and/or immunotherapy agents darzalex      To help prevent nausea and vomiting after your treatment, we encourage you to take your nausea medication as directed.  BELOW ARE SYMPTOMS THAT SHOULD BE REPORTED IMMEDIATELY: *FEVER GREATER THAN 100.4 F (38 C) OR HIGHER *CHILLS OR SWEATING *NAUSEA AND VOMITING THAT IS NOT CONTROLLED WITH YOUR NAUSEA MEDICATION *UNUSUAL SHORTNESS OF BREATH *UNUSUAL BRUISING OR BLEEDING *URINARY PROBLEMS (pain or burning when urinating, or frequent urination) *BOWEL PROBLEMS (unusual diarrhea, constipation, pain near the anus) TENDERNESS IN MOUTH AND THROAT WITH OR WITHOUT PRESENCE OF ULCERS (sore throat, sores in mouth, or a toothache) UNUSUAL RASH, SWELLING OR PAIN  UNUSUAL VAGINAL DISCHARGE OR ITCHING   Items with * indicate a potential emergency and should be followed up as soon as possible or go to the Emergency Department if any problems should occur.  Please show the CHEMOTHERAPY ALERT CARD or IMMUNOTHERAPY ALERT CARD at check-in to  the Emergency Department and triage nurse.  Should you have questions after your visit or need to cancel or reschedule your appointment, please contact [facility]  Dept: [phone]  and follow the prompts.  Office hours are 8:00 a.m. to 4:30 p.m. Monday - Friday. Please note that voicemails left after 4:00 p.m. may not be returned until the following business day.  We are closed weekends and major holidays. You have access to a nurse at all times for urgent questions. Please call the main number to the clinic Dept: [phone] and follow the prompts.   For any non-urgent questions, you may also contact your provider using MyChart. We now offer e-Visits for anyone 18 and older to request care online for non-urgent symptoms. For details visit [email or web address].   Also download the MyChart app! Go to the app store, search "MyChart", open the app, select [place], and log in with your MyChart username and password.  Due to Covid, a mask is required upon entering the hospital/clinic. If you do not have a mask, one will be given to you upon arrival. For doctor visits, patients may have 1 support person aged 18 or older with them. For treatment visits, patients cannot have anyone with them due to current Covid guidelines and our immunocompromised population.  

## 2020-09-14 ENCOUNTER — Other Ambulatory Visit (HOSPITAL_COMMUNITY): Payer: Self-pay

## 2020-09-26 ENCOUNTER — Telehealth: Payer: Self-pay | Admitting: Internal Medicine

## 2020-09-26 NOTE — Telephone Encounter (Signed)
Scheduled appt per 8/3 sch msg. Pt aware.  

## 2020-09-27 ENCOUNTER — Other Ambulatory Visit (HOSPITAL_COMMUNITY): Payer: Self-pay

## 2020-09-27 ENCOUNTER — Inpatient Hospital Stay: Payer: Self-pay | Attending: Internal Medicine

## 2020-09-27 ENCOUNTER — Inpatient Hospital Stay: Payer: Self-pay

## 2020-09-27 ENCOUNTER — Other Ambulatory Visit: Payer: Self-pay

## 2020-09-27 VITALS — BP 125/78 | HR 102 | Temp 97.8°F | Resp 16 | Wt 176.0 lb

## 2020-09-27 DIAGNOSIS — R5383 Other fatigue: Secondary | ICD-10-CM | POA: Insufficient documentation

## 2020-09-27 DIAGNOSIS — R140 Abdominal distension (gaseous): Secondary | ICD-10-CM | POA: Insufficient documentation

## 2020-09-27 DIAGNOSIS — C9000 Multiple myeloma not having achieved remission: Secondary | ICD-10-CM | POA: Insufficient documentation

## 2020-09-27 DIAGNOSIS — Z5112 Encounter for antineoplastic immunotherapy: Secondary | ICD-10-CM | POA: Insufficient documentation

## 2020-09-27 DIAGNOSIS — R21 Rash and other nonspecific skin eruption: Secondary | ICD-10-CM | POA: Insufficient documentation

## 2020-09-27 DIAGNOSIS — Z79899 Other long term (current) drug therapy: Secondary | ICD-10-CM | POA: Insufficient documentation

## 2020-09-27 DIAGNOSIS — Z95828 Presence of other vascular implants and grafts: Secondary | ICD-10-CM

## 2020-09-27 LAB — CBC WITH DIFFERENTIAL (CANCER CENTER ONLY)
Abs Immature Granulocytes: 0.01 10*3/uL (ref 0.00–0.07)
Basophils Absolute: 0 10*3/uL (ref 0.0–0.1)
Basophils Relative: 0 %
Eosinophils Absolute: 0.1 10*3/uL (ref 0.0–0.5)
Eosinophils Relative: 3 %
HCT: 30.4 % — ABNORMAL LOW (ref 36.0–46.0)
Hemoglobin: 10.7 g/dL — ABNORMAL LOW (ref 12.0–15.0)
Immature Granulocytes: 0 %
Lymphocytes Relative: 22 %
Lymphs Abs: 0.9 10*3/uL (ref 0.7–4.0)
MCH: 29.3 pg (ref 26.0–34.0)
MCHC: 35.2 g/dL (ref 30.0–36.0)
MCV: 83.3 fL (ref 80.0–100.0)
Monocytes Absolute: 0.5 10*3/uL (ref 0.1–1.0)
Monocytes Relative: 13 %
Neutro Abs: 2.4 10*3/uL (ref 1.7–7.7)
Neutrophils Relative %: 62 %
Platelet Count: 60 10*3/uL — ABNORMAL LOW (ref 150–400)
RBC: 3.65 MIL/uL — ABNORMAL LOW (ref 3.87–5.11)
RDW: 13.9 % (ref 11.5–15.5)
WBC Count: 3.9 10*3/uL — ABNORMAL LOW (ref 4.0–10.5)
nRBC: 0 % (ref 0.0–0.2)

## 2020-09-27 LAB — CMP (CANCER CENTER ONLY)
ALT: 13 U/L (ref 0–44)
AST: 17 U/L (ref 15–41)
Albumin: 3.3 g/dL — ABNORMAL LOW (ref 3.5–5.0)
Alkaline Phosphatase: 63 U/L (ref 38–126)
Anion gap: 9 (ref 5–15)
BUN: 34 mg/dL — ABNORMAL HIGH (ref 8–23)
CO2: 26 mmol/L (ref 22–32)
Calcium: 8.8 mg/dL — ABNORMAL LOW (ref 8.9–10.3)
Chloride: 103 mmol/L (ref 98–111)
Creatinine: 2.39 mg/dL — ABNORMAL HIGH (ref 0.44–1.00)
GFR, Estimated: 21 mL/min — ABNORMAL LOW (ref >60)
Glucose, Bld: 82 mg/dL (ref 70–99)
Potassium: 4.1 mmol/L (ref 3.5–5.1)
Sodium: 138 mmol/L (ref 135–145)
Total Bilirubin: 0.6 mg/dL (ref 0.3–1.2)
Total Protein: 5.4 g/dL — ABNORMAL LOW (ref 6.5–8.1)

## 2020-09-27 MED ORDER — HEPARIN SOD (PORK) LOCK FLUSH 100 UNIT/ML IV SOLN
500.0000 [IU] | Freq: Once | INTRAVENOUS | Status: AC | PRN
  Administered 2020-09-27: 500 [IU]
  Filled 2020-09-27: qty 5

## 2020-09-27 MED ORDER — METHYLPREDNISOLONE SODIUM SUCC 125 MG IJ SOLR
INTRAMUSCULAR | Status: AC
  Filled 2020-09-27: qty 2

## 2020-09-27 MED ORDER — SODIUM CHLORIDE 0.9% FLUSH
10.0000 mL | INTRAVENOUS | Status: DC | PRN
  Administered 2020-09-27: 10 mL
  Filled 2020-09-27: qty 10

## 2020-09-27 MED ORDER — SODIUM CHLORIDE 0.9 % IV SOLN
Freq: Once | INTRAVENOUS | Status: AC
  Filled 2020-09-27: qty 250

## 2020-09-27 MED ORDER — METHYLPREDNISOLONE SODIUM SUCC 125 MG IJ SOLR
100.0000 mg | Freq: Once | INTRAMUSCULAR | Status: AC
  Administered 2020-09-27: 100 mg via INTRAVENOUS

## 2020-09-27 MED ORDER — ACETAMINOPHEN 325 MG PO TABS
650.0000 mg | ORAL_TABLET | Freq: Once | ORAL | Status: AC
  Administered 2020-09-27: 650 mg via ORAL

## 2020-09-27 MED ORDER — ACETAMINOPHEN 325 MG PO TABS
ORAL_TABLET | ORAL | Status: AC
  Filled 2020-09-27: qty 2

## 2020-09-27 MED ORDER — DIPHENHYDRAMINE HCL 25 MG PO CAPS
ORAL_CAPSULE | ORAL | Status: AC
  Filled 2020-09-27: qty 2

## 2020-09-27 MED ORDER — DIPHENHYDRAMINE HCL 25 MG PO CAPS
50.0000 mg | ORAL_CAPSULE | Freq: Once | ORAL | Status: AC
  Administered 2020-09-27: 50 mg via ORAL

## 2020-09-27 MED ORDER — SODIUM CHLORIDE 0.9 % IV SOLN
16.0000 mg/kg | Freq: Once | INTRAVENOUS | Status: AC
  Administered 2020-09-27: 1400 mg via INTRAVENOUS
  Filled 2020-09-27: qty 60

## 2020-09-27 NOTE — Progress Notes (Signed)
Per Dr. Julien Nordmann, ok to treat with platelets of 60.

## 2020-09-27 NOTE — Patient Instructions (Signed)
[facility]COL[facility]  Discharge Instructions: Thank you for choosing [facility] to provide your oncology and hematology care.   If you have a lab appointment with the Cancer Center, please go directly to the Cancer Center and check in at the registration area.   Wear comfortable clothing and clothing appropriate for easy access to any Portacath or PICC line.   We strive to give you quality time with your provider. You may need to reschedule your appointment if you arrive late (15 or more minutes).  Arriving late affects you and other patients whose appointments are after yours.  Also, if you miss three or more appointments without notifying the office, you may be dismissed from the clinic at the provider's discretion.      For prescription refill requests, have your pharmacy contact our office and allow 72 hours for refills to be completed.    Today you received the following chemotherapy and/or immunotherapy agents darzalex      To help prevent nausea and vomiting after your treatment, we encourage you to take your nausea medication as directed.  BELOW ARE SYMPTOMS THAT SHOULD BE REPORTED IMMEDIATELY: *FEVER GREATER THAN 100.4 F (38 C) OR HIGHER *CHILLS OR SWEATING *NAUSEA AND VOMITING THAT IS NOT CONTROLLED WITH YOUR NAUSEA MEDICATION *UNUSUAL SHORTNESS OF BREATH *UNUSUAL BRUISING OR BLEEDING *URINARY PROBLEMS (pain or burning when urinating, or frequent urination) *BOWEL PROBLEMS (unusual diarrhea, constipation, pain near the anus) TENDERNESS IN MOUTH AND THROAT WITH OR WITHOUT PRESENCE OF ULCERS (sore throat, sores in mouth, or a toothache) UNUSUAL RASH, SWELLING OR PAIN  UNUSUAL VAGINAL DISCHARGE OR ITCHING   Items with * indicate a potential emergency and should be followed up as soon as possible or go to the Emergency Department if any problems should occur.  Please show the CHEMOTHERAPY ALERT CARD or IMMUNOTHERAPY ALERT CARD at check-in to  the Emergency Department and triage nurse.  Should you have questions after your visit or need to cancel or reschedule your appointment, please contact [facility]  Dept: [phone]  and follow the prompts.  Office hours are 8:00 a.m. to 4:30 p.m. Monday - Friday. Please note that voicemails left after 4:00 p.m. may not be returned until the following business day.  We are closed weekends and major holidays. You have access to a nurse at all times for urgent questions. Please call the main number to the clinic Dept: [phone] and follow the prompts.   For any non-urgent questions, you may also contact your provider using MyChart. We now offer e-Visits for anyone 18 and older to request care online for non-urgent symptoms. For details visit [email or web address].   Also download the MyChart app! Go to the app store, search "MyChart", open the app, select [place], and log in with your MyChart username and password.  Due to Covid, a mask is required upon entering the hospital/clinic. If you do not have a mask, one will be given to you upon arrival. For doctor visits, patients may have 1 support person aged 18 or older with them. For treatment visits, patients cannot have anyone with them due to current Covid guidelines and our immunocompromised population.  

## 2020-10-11 ENCOUNTER — Inpatient Hospital Stay (HOSPITAL_BASED_OUTPATIENT_CLINIC_OR_DEPARTMENT_OTHER): Payer: Self-pay | Admitting: Internal Medicine

## 2020-10-11 ENCOUNTER — Inpatient Hospital Stay: Payer: Self-pay

## 2020-10-11 ENCOUNTER — Other Ambulatory Visit: Payer: Self-pay | Admitting: Physician Assistant

## 2020-10-11 ENCOUNTER — Other Ambulatory Visit: Payer: Self-pay

## 2020-10-11 ENCOUNTER — Encounter: Payer: Self-pay | Admitting: Internal Medicine

## 2020-10-11 ENCOUNTER — Other Ambulatory Visit (HOSPITAL_COMMUNITY): Payer: Self-pay

## 2020-10-11 VITALS — BP 147/96 | HR 99 | Temp 98.7°F | Resp 20 | Wt 188.5 lb

## 2020-10-11 DIAGNOSIS — C9000 Multiple myeloma not having achieved remission: Secondary | ICD-10-CM

## 2020-10-11 DIAGNOSIS — Z95828 Presence of other vascular implants and grafts: Secondary | ICD-10-CM

## 2020-10-11 DIAGNOSIS — Z5111 Encounter for antineoplastic chemotherapy: Secondary | ICD-10-CM

## 2020-10-11 DIAGNOSIS — R21 Rash and other nonspecific skin eruption: Secondary | ICD-10-CM

## 2020-10-11 LAB — CBC WITH DIFFERENTIAL (CANCER CENTER ONLY)
Abs Immature Granulocytes: 0 10*3/uL (ref 0.00–0.07)
Basophils Absolute: 0 10*3/uL (ref 0.0–0.1)
Basophils Relative: 1 %
Eosinophils Absolute: 0.2 10*3/uL (ref 0.0–0.5)
Eosinophils Relative: 4 %
HCT: 31.9 % — ABNORMAL LOW (ref 36.0–46.0)
Hemoglobin: 11.1 g/dL — ABNORMAL LOW (ref 12.0–15.0)
Immature Granulocytes: 0 %
Lymphocytes Relative: 22 %
Lymphs Abs: 0.8 10*3/uL (ref 0.7–4.0)
MCH: 29.4 pg (ref 26.0–34.0)
MCHC: 34.8 g/dL (ref 30.0–36.0)
MCV: 84.4 fL (ref 80.0–100.0)
Monocytes Absolute: 0.5 10*3/uL (ref 0.1–1.0)
Monocytes Relative: 12 %
Neutro Abs: 2.3 10*3/uL (ref 1.7–7.7)
Neutrophils Relative %: 61 %
Platelet Count: 64 10*3/uL — ABNORMAL LOW (ref 150–400)
RBC: 3.78 MIL/uL — ABNORMAL LOW (ref 3.87–5.11)
RDW: 14.6 % (ref 11.5–15.5)
WBC Count: 3.8 10*3/uL — ABNORMAL LOW (ref 4.0–10.5)
nRBC: 0 % (ref 0.0–0.2)

## 2020-10-11 LAB — CMP (CANCER CENTER ONLY)
ALT: 10 U/L (ref 0–44)
AST: 18 U/L (ref 15–41)
Albumin: 3.4 g/dL — ABNORMAL LOW (ref 3.5–5.0)
Alkaline Phosphatase: 79 U/L (ref 38–126)
Anion gap: 9 (ref 5–15)
BUN: 32 mg/dL — ABNORMAL HIGH (ref 8–23)
CO2: 21 mmol/L — ABNORMAL LOW (ref 22–32)
Calcium: 8.5 mg/dL — ABNORMAL LOW (ref 8.9–10.3)
Chloride: 106 mmol/L (ref 98–111)
Creatinine: 2.36 mg/dL — ABNORMAL HIGH (ref 0.44–1.00)
GFR, Estimated: 22 mL/min — ABNORMAL LOW (ref >60)
Glucose, Bld: 98 mg/dL (ref 70–99)
Potassium: 4.6 mmol/L (ref 3.5–5.1)
Sodium: 136 mmol/L (ref 135–145)
Total Bilirubin: 0.6 mg/dL (ref 0.3–1.2)
Total Protein: 5.5 g/dL — ABNORMAL LOW (ref 6.5–8.1)

## 2020-10-11 MED ORDER — HEPARIN SOD (PORK) LOCK FLUSH 100 UNIT/ML IV SOLN
500.0000 [IU] | Freq: Once | INTRAVENOUS | Status: AC | PRN
  Administered 2020-10-11: 500 [IU]

## 2020-10-11 MED ORDER — DIPHENHYDRAMINE HCL 25 MG PO CAPS: 50.0000 mg | ORAL_CAPSULE | Freq: Once | ORAL | Status: DC

## 2020-10-11 MED ORDER — SODIUM CHLORIDE 0.9 % IV SOLN
16.0000 mg/kg | Freq: Once | INTRAVENOUS | Status: AC
  Administered 2020-10-11: 1400 mg via INTRAVENOUS
  Filled 2020-10-11: qty 60

## 2020-10-11 MED ORDER — METHYLPREDNISOLONE SODIUM SUCC 125 MG IJ SOLR
100.0000 mg | Freq: Once | INTRAMUSCULAR | Status: AC
  Administered 2020-10-11: 100 mg via INTRAVENOUS

## 2020-10-11 MED ORDER — ACETAMINOPHEN 325 MG PO TABS
ORAL_TABLET | ORAL | Status: AC
  Filled 2020-10-11: qty 2

## 2020-10-11 MED ORDER — METHYLPREDNISOLONE SODIUM SUCC 125 MG IJ SOLR
INTRAMUSCULAR | Status: AC
  Filled 2020-10-11: qty 2

## 2020-10-11 MED ORDER — SODIUM CHLORIDE 0.9% FLUSH
10.0000 mL | INTRAVENOUS | Status: DC | PRN
  Administered 2020-10-11: 10 mL

## 2020-10-11 MED ORDER — ACETAMINOPHEN 325 MG PO TABS
650.0000 mg | ORAL_TABLET | Freq: Once | ORAL | Status: AC
  Administered 2020-10-11: 650 mg via ORAL

## 2020-10-11 MED ORDER — DIPHENHYDRAMINE HCL 25 MG PO CAPS
ORAL_CAPSULE | ORAL | Status: AC
  Administered 2020-10-11: 50 mg
  Filled 2020-10-11: qty 2

## 2020-10-11 MED ORDER — SODIUM CHLORIDE 0.9 % IV SOLN: Freq: Once | INTRAVENOUS | Status: AC

## 2020-10-11 MED ORDER — HYDROXYZINE HCL 10 MG PO TABS
10.0000 mg | ORAL_TABLET | Freq: Every day | ORAL | 0 refills | Status: AC
Start: 2020-10-11 — End: ?
  Filled 2020-10-11: qty 30, 30d supply, fill #0

## 2020-10-11 NOTE — Progress Notes (Signed)
Per Dr. Julien Nordmann, okay to tx with plt of 41.

## 2020-10-11 NOTE — Progress Notes (Signed)
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  PRIOR THERAPY: 1) Systemic therapy with Velcade 1.3 mg/M2 weekly, Revlimid 25 mg p.o. daily for 14 days every 3 weeks in addition to weekly Decadron 40 mg orally. First dose of treatment 10/08/2017.  Treatment was placed on hold due to development of a significant rash.  She resumed treatment with only dexamethasone and Velcade on 11/19/2017. This was discontinued on 04/14/2019 due to evidence of disease progression.  2) Pomalyst 4 mg p.o. for 21 days every 4 weeks and 40 mg p.o. Decadron once a week. First dose 04/29/2019-05/04/2019. Status post 6 days of treatment. Discontinued secondary to allergy to Pomalyst.  3) Chemotherapy with carfilzomib on days 1, 2, 8, 9, 15, and 16 and Cytoxan 300 mg/m2 on days 1, 8, and 15 IV every 4 weeks and 40 mg p.o. weekly of Decadron. Last dose expected on 03/02/2020. Status post 11 cycles. Discontinued due to intolerance  CURRENT THERAPY: Chemotherapy with Daratumumab on days 1, 8, 15, and 22 IV every 4 weeks and 20 mg p.o. weekly of Decadron. First dose on 05/17/2020.  Starting from cycle #3 she is on treatment every 2 weeks.  Status post 5 cycles.  INTERVAL HISTORY: Debbie Chan 70 y.o. female returns to the clinic today for follow-up visit.  The patient is feeling fine today with no concerning complaints except for fatigue and the distention of her abdomen likely secondary to long-term treatment with steroids.  She also continues to have itching and mild rash in the lower extremities.  She denied having any current chest pain, shortness of breath, cough or hemoptysis.  She denied having any fever or chills.  She has no nausea, vomiting, diarrhea or constipation.  She is here today for evaluation before starting cycle #6 of her  treatment.   MEDICAL HISTORY: Past Medical History:  Diagnosis Date   Cancer ([facility])     ALLERGIES:  has No Known Allergies.  MEDICATIONS:  Current Outpatient Medications  Medication Sig Dispense Refill   acyclovir (ZOVIRAX) 200 MG capsule TAKE 1 CAPSULE BY MOUTH TWICE DAILY 60 capsule 4   amLODipine (NORVASC) 2.5 MG tablet TAKE 1 TABLET BY MOUTH ONCE A DAY 30 tablet 3   azithromycin (ZITHROMAX Z-PAK) 250 MG tablet Take 2 tablets day 1 and then 1 tablet daily days 2 through 5 (Patient taking differently: Take 2 tablets day 1 and then 1 tablet daily days 2 through 5) 6 each 0   benzonatate (TESSALON) 100 MG capsule Take 1 capsule (100 mg total) by mouth 3 (three) times daily as needed for cough. 21 capsule 0   blood glucose meter kit and supplies Dispense based on patient and insurance preference. Use up to four times daily as directed. (FOR ICD-10 E10.9, E11.9). 1 each 0   carvedilol (COREG) 12.5 MG tablet TAKE 1 TABLET BY MOUTH 2 TIMES DAILY WITH A MEAL 60 tablet 3   carvedilol (COREG) 12.5 MG tablet TAKE 1 TABLET BY MOUTH 2 TIMES DAILY WITH A MEAL 60 tablet 3   dexamethasone 20 MG TABS Please take 1  tablet once a week on the day of treatment 30 tablet 2   ferrous sulfate 325 (65 FE) MG tablet TAKE 1 TABLET BY MOUTH ONCE A DAY WITH BREAKFAST 30 tablet 3   furosemide (LASIX) 40 MG tablet  TAKE 1 TABLET BY MOUTH 2 TIMES DAILY 60 tablet 3   hydrALAZINE (APRESOLINE) 100 MG tablet TAKE 1 TABLET BY MOUTH EVERY 8 HOURS 90 tablet 3   HYDROcodone-acetaminophen (NORCO) 5-325 MG tablet Take 1 tablet by mouth every 6 (six) hours as needed for moderate pain. 30 tablet 0   hydrocortisone 2.5 % cream Apply topically 2 (two) times daily as needed. 454 g 0   hydrOXYzine (ATARAX/VISTARIL) 10 MG tablet Take 1 tablet (10 mg total) by mouth at bedtime. 30 tablet 0   isosorbide mononitrate (IMDUR) 30 MG 24 hr tablet TAKE 1 TABLET BY MOUTH ONCE A DAY 30 tablet 3   lidocaine-prilocaine (EMLA) cream Apply  topically as needed 30 g 1   loratadine (CLARITIN) 10 MG tablet Take 10 mg by mouth daily.     MELATONIN PO Take 10 mg by mouth at bedtime as needed (sleep).     omeprazole (PRILOSEC) 20 MG capsule TAKE 1 CAPSULE BY MOUTH ONCE DAILY 30 capsule 4   ReliOn Ultra Thin Lancets 30G MISC USE 1 TO CHECK GLUCOSE ONCE DAILY FOR GLUCOSE TESTING     No current facility-administered medications for this visit.   Facility-Administered Medications Ordered in Other Visits  Medication Dose Route Frequency Provider Last Rate Last Admin   sodium chloride flush (NS) 0.9 % injection 10 mL  10 mL Intracatheter PRN Curt Bears, MD   10 mL at 02/22/20 1127   sodium chloride flush (NS) 0.9 % injection 10 mL  10 mL Intracatheter PRN Curt Bears, MD   10 mL at 07/19/20 1630   sodium chloride flush (NS) 0.9 % injection 10 mL  10 mL Intracatheter PRN Curt Bears, MD   10 mL at 10/11/20 0800    SURGICAL HISTORY:  Past Surgical History:  Procedure Laterality Date   IR IMAGING GUIDED PORT INSERTION  05/10/2019    REVIEW OF SYSTEMS:  A comprehensive review of systems was negative except for: Constitutional: positive for fatigue Gastrointestinal: positive for abdominal distention Integument/breast: positive for dryness, pruritus, and rash   PHYSICAL EXAMINATION: General appearance: alert, cooperative, fatigued, and no distress Head: Normocephalic, without obvious abnormality, atraumatic Neck: no adenopathy, no JVD, supple, symmetrical, trachea midline, and thyroid not enlarged, symmetric, no tenderness/mass/nodules Lymph nodes: Cervical, supraclavicular, and axillary nodes normal. Resp: clear to auscultation bilaterally Back: symmetric, no curvature. ROM normal. No CVA tenderness. Cardio: regular rate and rhythm, S1, S2 normal, no murmur, click, rub or gallop GI: soft, non-tender; bowel sounds normal; no masses,  no organomegaly Extremities: extremities normal, atraumatic, no cyanosis or edema  ECOG  PERFORMANCE STATUS: 1 - Symptomatic but completely ambulatory  There were no vitals taken for this visit.  LABORATORY DATA: Lab Results  Component Value Date   WBC 3.9 (L) 09/27/2020   HGB 10.7 (L) 09/27/2020   HCT 30.4 (L) 09/27/2020   MCV 83.3 09/27/2020   PLT 60 (L) 09/27/2020      Chemistry      Component Value Date/Time   NA 138 09/27/2020 0745   NA 132 (L) 04/17/2020 1612   K 4.1 09/27/2020 0745   CL 103 09/27/2020 0745   CO2 26 09/27/2020 0745   BUN 34 (H) 09/27/2020 0745   BUN 46 (H) 04/17/2020 1612   CREATININE 2.39 (H) 09/27/2020 0745      Component Value Date/Time   CALCIUM 8.8 (L) 09/27/2020 0745   ALKPHOS 63 09/27/2020 0745   AST 17 09/27/2020 0745   ALT 13 09/27/2020 0745   BILITOT  0.6 09/27/2020 0745       RADIOGRAPHIC STUDIES: No results found.  ASSESSMENT AND PLAN: This is a very pleasant 70 years old African female originally from [place] who is visiting her son in [place] and was recently diagnosed with IgA multiple myeloma. The patient was a started initially on treatment with weekly subcutaneous Velcade 1.3 mg/M2, Revlimid 25 mg p.o. daily for 21 days every 4 weeks as well as weekly Decadron 40 mg orally.  Revlimid was discontinued secondary to hypersensitivity reaction with significant skin rash.  She is currently on treatment with weekly Velcade and Decadron. Her treatment was discontinued secondary to disease progression.  The patient started treatment with Pomalyst and Decadron but this was discontinued secondary to hypersensitivity reaction to the Pomalyst. She underwent systemic chemotherapy with carfilzomib, Cytoxan and Decadron status post 11 cycles. The patient tolerated the previous treatment well but this was discontinued secondary to disease progression as well as recent hospitalization with COVID-19 infection and pneumonia.  She is recovering well but she has not received any treatment for the last 6 weeks or more. She  started treatment with daratumumab and Decadron status post 5 cycles.  The patient continues to tolerate this treatment well with no concerning adverse effect except for mild fatigue and the abdominal distention secondary to steroid treatment. I recommended for her to proceed with cycle #6 today as planned. She will come back for follow-up visit in 4 weeks for evaluation after repeating myeloma panel. The patient still like to travel back to [place] and I am okay with her decision and we can give her a break off treatment for 1 or 2 months to see her family and come back unless she wants to continue her treatment back home in [place]. The patient was advised to call immediately if she has any other concerning symptoms in the interval. The patient voices understanding of current disease status and treatment options and is in agreement with the current care plan.  All questions were answered. The patient knows to call the clinic with any problems, questions or concerns. We can certainly see the patient much sooner if necessary.   Disclaimer: This note was dictated with voice recognition software. Similar sounding words can inadvertently be transcribed and may not be corrected upon review.

## 2020-10-11 NOTE — Patient Instructions (Signed)
[facility]  Discharge Instructions: Thank you for choosing [facility] to provide your oncology and hematology care.   If you have a lab appointment with the [facility], please go directly to the [facility] and check in at the registration area.   Wear comfortable clothing and clothing appropriate for easy access to any Portacath or PICC line.   We strive to give you quality time with your provider. You may need to reschedule your appointment if you arrive late (15 or more minutes).  Arriving late affects you and other patients whose appointments are after yours.  Also, if you miss three or more appointments without notifying the office, you may be dismissed from the clinic at the provider's discretion.      For prescription refill requests, have your pharmacy contact our office and allow 72 hours for refills to be completed.    Today you received the following chemotherapy and/or immunotherapy agent: Daratumumab (Darzalex).   To help prevent nausea and vomiting after your treatment, we encourage you to take your nausea medication as directed.  BELOW ARE SYMPTOMS THAT SHOULD BE REPORTED IMMEDIATELY: *FEVER GREATER THAN 100.4 F (38 C) OR HIGHER *CHILLS OR SWEATING *NAUSEA AND VOMITING THAT IS NOT CONTROLLED WITH YOUR NAUSEA MEDICATION *UNUSUAL SHORTNESS OF BREATH *UNUSUAL BRUISING OR BLEEDING *URINARY PROBLEMS (pain or burning when urinating, or frequent urination) *BOWEL PROBLEMS (unusual diarrhea, constipation, pain near the anus) TENDERNESS IN MOUTH AND THROAT WITH OR WITHOUT PRESENCE OF ULCERS (sore throat, sores in mouth, or a toothache) UNUSUAL RASH, SWELLING OR PAIN  UNUSUAL VAGINAL DISCHARGE OR ITCHING   Items with * indicate a potential emergency and should be followed up as soon as possible or go to the Emergency Department if any problems should occur.  Please show the CHEMOTHERAPY ALERT CARD or IMMUNOTHERAPY ALERT CARD at  check-in to the Emergency Department and triage nurse.  Should you have questions after your visit or need to cancel or reschedule your appointment, please contact [facility]  Dept: [phone]  and follow the prompts.  Office hours are 8:00 a.m. to 4:30 p.m. Monday - Friday. Please note that voicemails left after 4:00 p.m. may not be returned until the following business day.  We are closed weekends and major holidays. You have access to a nurse at all times for urgent questions. Please call the main number to the clinic Dept: [phone] and follow the prompts.   For any non-urgent questions, you may also contact your provider using MyChart. We now offer e-Visits for anyone 68 and older to request care online for non-urgent symptoms. For details visit [email or web address].   Also download the MyChart app! Go to the app store, search "MyChart", open the app, select [facility], and log in with your MyChart username and password.  Due to Covid, a mask is required upon entering the hospital/clinic. If you do not have a mask, one will be given to you upon arrival. For doctor visits, patients may have 1 support person aged 16 or older with them. For treatment visits, patients cannot have anyone with them due to current Covid guidelines and our immunocompromised population.

## 2020-10-13 ENCOUNTER — Encounter: Payer: Self-pay | Admitting: Internal Medicine

## 2020-10-13 ENCOUNTER — Other Ambulatory Visit (HOSPITAL_COMMUNITY): Payer: Self-pay

## 2020-10-18 ENCOUNTER — Other Ambulatory Visit (HOSPITAL_COMMUNITY): Payer: Self-pay

## 2020-10-18 MED ORDER — PREDNISOLON-MOXIFLOX-NEPAFENAC 1-0.5-0.1 % OP SUSP: OPHTHALMIC | 1 refills | Status: DC

## 2020-10-25 ENCOUNTER — Inpatient Hospital Stay: Payer: Self-pay | Attending: Internal Medicine

## 2020-10-25 ENCOUNTER — Other Ambulatory Visit: Payer: Self-pay | Admitting: Physician Assistant

## 2020-10-25 ENCOUNTER — Other Ambulatory Visit: Payer: Self-pay

## 2020-10-25 ENCOUNTER — Other Ambulatory Visit (HOSPITAL_COMMUNITY): Payer: Self-pay

## 2020-10-25 ENCOUNTER — Inpatient Hospital Stay: Payer: Self-pay

## 2020-10-25 ENCOUNTER — Encounter: Payer: Self-pay | Admitting: Internal Medicine

## 2020-10-25 ENCOUNTER — Other Ambulatory Visit: Payer: Self-pay | Admitting: Medical Oncology

## 2020-10-25 VITALS — BP 137/87 | HR 98 | Temp 98.8°F | Resp 18 | Ht 68.0 in | Wt 179.8 lb

## 2020-10-25 DIAGNOSIS — C9000 Multiple myeloma not having achieved remission: Secondary | ICD-10-CM

## 2020-10-25 DIAGNOSIS — Z95828 Presence of other vascular implants and grafts: Secondary | ICD-10-CM

## 2020-10-25 DIAGNOSIS — R21 Rash and other nonspecific skin eruption: Secondary | ICD-10-CM | POA: Insufficient documentation

## 2020-10-25 DIAGNOSIS — Z79899 Other long term (current) drug therapy: Secondary | ICD-10-CM | POA: Insufficient documentation

## 2020-10-25 DIAGNOSIS — Z5112 Encounter for antineoplastic immunotherapy: Secondary | ICD-10-CM | POA: Insufficient documentation

## 2020-10-25 DIAGNOSIS — Z7952 Long term (current) use of systemic steroids: Secondary | ICD-10-CM | POA: Insufficient documentation

## 2020-10-25 LAB — CBC WITH DIFFERENTIAL (CANCER CENTER ONLY)
Abs Immature Granulocytes: 0.01 10*3/uL (ref 0.00–0.07)
Basophils Absolute: 0 10*3/uL (ref 0.0–0.1)
Basophils Relative: 0 %
Eosinophils Absolute: 0.2 10*3/uL (ref 0.0–0.5)
Eosinophils Relative: 6 %
HCT: 31.6 % — ABNORMAL LOW (ref 36.0–46.0)
Hemoglobin: 11.6 g/dL — ABNORMAL LOW (ref 12.0–15.0)
Immature Granulocytes: 0 %
Lymphocytes Relative: 22 %
Lymphs Abs: 0.7 10*3/uL (ref 0.7–4.0)
MCH: 30.3 pg (ref 26.0–34.0)
MCHC: 36.7 g/dL — ABNORMAL HIGH (ref 30.0–36.0)
MCV: 82.5 fL (ref 80.0–100.0)
Monocytes Absolute: 0.4 10*3/uL (ref 0.1–1.0)
Monocytes Relative: 12 %
Neutro Abs: 2 10*3/uL (ref 1.7–7.7)
Neutrophils Relative %: 60 %
Platelet Count: 57 10*3/uL — ABNORMAL LOW (ref 150–400)
RBC: 3.83 MIL/uL — ABNORMAL LOW (ref 3.87–5.11)
RDW: 14.6 % (ref 11.5–15.5)
WBC Count: 3.3 10*3/uL — ABNORMAL LOW (ref 4.0–10.5)
nRBC: 0 % (ref 0.0–0.2)

## 2020-10-25 LAB — CMP (CANCER CENTER ONLY)
ALT: 8 U/L (ref 0–44)
AST: 18 U/L (ref 15–41)
Albumin: 3.6 g/dL (ref 3.5–5.0)
Alkaline Phosphatase: 65 U/L (ref 38–126)
Anion gap: 11 (ref 5–15)
BUN: 25 mg/dL — ABNORMAL HIGH (ref 8–23)
CO2: 25 mmol/L (ref 22–32)
Calcium: 9.1 mg/dL (ref 8.9–10.3)
Chloride: 100 mmol/L (ref 98–111)
Creatinine: 2.24 mg/dL — ABNORMAL HIGH (ref 0.44–1.00)
GFR, Estimated: 23 mL/min — ABNORMAL LOW (ref >60)
Glucose, Bld: 103 mg/dL — ABNORMAL HIGH (ref 70–99)
Potassium: 3.6 mmol/L (ref 3.5–5.1)
Sodium: 136 mmol/L (ref 135–145)
Total Bilirubin: 0.8 mg/dL (ref 0.3–1.2)
Total Protein: 5.7 g/dL — ABNORMAL LOW (ref 6.5–8.1)

## 2020-10-25 MED ORDER — DEXAMETHASONE 4 MG PO TABS
ORAL_TABLET | ORAL | 2 refills | Status: DC
  Filled 2020-10-25: qty 20, 28d supply, fill #0
  Filled 2020-11-27: qty 20, 28d supply, fill #1

## 2020-10-25 MED ORDER — DEXAMETHASONE 20 MG PO TABS
1.0000 | ORAL_TABLET | ORAL | 2 refills | Status: DC
  Filled 2020-10-25: qty 30, fill #0

## 2020-10-25 MED ORDER — SODIUM CHLORIDE 0.9 % IV SOLN: Freq: Once | INTRAVENOUS | Status: AC

## 2020-10-25 MED ORDER — ACETAMINOPHEN 325 MG PO TABS
650.0000 mg | ORAL_TABLET | Freq: Once | ORAL | Status: AC
  Administered 2020-10-25: 650 mg via ORAL
  Filled 2020-10-25: qty 2

## 2020-10-25 MED ORDER — DIPHENHYDRAMINE HCL 25 MG PO CAPS
50.0000 mg | ORAL_CAPSULE | Freq: Once | ORAL | Status: AC
  Administered 2020-10-25: 50 mg via ORAL
  Filled 2020-10-25: qty 2

## 2020-10-25 MED ORDER — SODIUM CHLORIDE 0.9% FLUSH
10.0000 mL | INTRAVENOUS | Status: DC | PRN
  Administered 2020-10-25: 10 mL

## 2020-10-25 MED ORDER — HEPARIN SOD (PORK) LOCK FLUSH 100 UNIT/ML IV SOLN
500.0000 [IU] | Freq: Once | INTRAVENOUS | Status: AC | PRN
  Administered 2020-10-25: 500 [IU]

## 2020-10-25 MED ORDER — SODIUM CHLORIDE 0.9% FLUSH
10.0000 mL | INTRAVENOUS | Status: DC | PRN
Start: 2020-10-25 — End: 2020-10-25
  Administered 2020-10-25: 10 mL

## 2020-10-25 MED ORDER — METHYLPREDNISOLONE SODIUM SUCC 125 MG IJ SOLR
100.0000 mg | Freq: Once | INTRAMUSCULAR | Status: AC
  Administered 2020-10-25: 100 mg via INTRAVENOUS
  Filled 2020-10-25: qty 2

## 2020-10-25 MED ORDER — SODIUM CHLORIDE 0.9 % IV SOLN
16.0000 mg/kg | Freq: Once | INTRAVENOUS | Status: AC
  Administered 2020-10-25: 1400 mg via INTRAVENOUS
  Filled 2020-10-25: qty 10

## 2020-10-25 NOTE — Patient Instructions (Signed)
[facility]  Discharge Instructions: °Thank you for choosing [facility] to provide your oncology and hematology care.  ° °If you have a lab appointment with the Cancer Center, please go directly to the Cancer Center and check in at the registration area. °  °Wear comfortable clothing and clothing appropriate for easy access to any Portacath or PICC line.  ° °We strive to give you quality time with your provider. You may need to reschedule your appointment if you arrive late (15 or more minutes).  Arriving late affects you and other patients whose appointments are after yours.  Also, if you miss three or more appointments without notifying the office, you may be dismissed from the clinic at the provider’s discretion.    °  °For prescription refill requests, have your pharmacy contact our office and allow 72 hours for refills to be completed.   ° °Today you received the following chemotherapy and/or immunotherapy agents: Darzalex  °  °To help prevent nausea and vomiting after your treatment, we encourage you to take your nausea medication as directed. ° °BELOW ARE SYMPTOMS THAT SHOULD BE REPORTED IMMEDIATELY: °*FEVER GREATER THAN 100.4 F (38 °C) OR HIGHER °*CHILLS OR SWEATING °*NAUSEA AND VOMITING THAT IS NOT CONTROLLED WITH YOUR NAUSEA MEDICATION °*UNUSUAL SHORTNESS OF BREATH °*UNUSUAL BRUISING OR BLEEDING °*URINARY PROBLEMS (pain or burning when urinating, or frequent urination) °*BOWEL PROBLEMS (unusual diarrhea, constipation, pain near the anus) °TENDERNESS IN MOUTH AND THROAT WITH OR WITHOUT PRESENCE OF ULCERS (sore throat, sores in mouth, or a toothache) °UNUSUAL RASH, SWELLING OR PAIN  °UNUSUAL VAGINAL DISCHARGE OR ITCHING  ° °Items with * indicate a potential emergency and should be followed up as soon as possible or go to the Emergency Department if any problems should occur. ° °Please show the CHEMOTHERAPY ALERT CARD or IMMUNOTHERAPY ALERT CARD at check-in to the  Emergency Department and triage nurse. ° °Should you have questions after your visit or need to cancel or reschedule your appointment, please contact [facility]  Dept: [phone]  and follow the prompts.  Office hours are 8:00 a.m. to 4:30 p.m. Monday - Friday. Please note that voicemails left after 4:00 p.m. may not be returned until the following business day.  We are closed weekends and major holidays. You have access to a nurse at all times for urgent questions. Please call the main number to the clinic Dept: [phone] and follow the prompts. ° ° °For any non-urgent questions, you may also contact your provider using MyChart. We now offer e-Visits for anyone 18 and older to request care online for non-urgent symptoms. For details visit [email or web address]. °  °Also download the MyChart app! Go to the app store, search "MyChart", open the app, select [facility], and log in with your MyChart username and password. ° °Due to Covid, a mask is required upon entering the hospital/clinic. If you do not have a mask, one will be given to you upon arrival. For doctor visits, patients may have 1 support person aged 18 or older with them. For treatment visits, patients cannot have anyone with them due to current Covid guidelines and our immunocompromised population.  ° °

## 2020-10-25 NOTE — Progress Notes (Signed)
Plts 57, Creatinine 2.24.  Per Dr. Julien Nordmann ok to proceed with chemotherapy.

## 2020-10-26 LAB — KAPPA/LAMBDA LIGHT CHAINS
Kappa free light chain: 9.4 mg/L (ref 3.3–19.4)
Kappa, lambda light chain ratio: 0.02 — ABNORMAL LOW (ref 0.26–1.65)
Lambda free light chains: 382.6 mg/L — ABNORMAL HIGH (ref 5.7–26.3)

## 2020-10-26 LAB — BETA 2 MICROGLOBULIN, SERUM: Beta-2 Microglobulin: 4.6 mg/L — ABNORMAL HIGH (ref 0.6–2.4)

## 2020-10-26 LAB — IGG, IGA, IGM
IgA: 288 mg/dL (ref 87–352)
IgG (Immunoglobin G), Serum: 302 mg/dL — ABNORMAL LOW (ref 586–1602)
IgM (Immunoglobulin M), Srm: 18 mg/dL — ABNORMAL LOW (ref 26–217)

## 2020-11-08 ENCOUNTER — Encounter: Payer: Self-pay | Admitting: Internal Medicine

## 2020-11-08 ENCOUNTER — Other Ambulatory Visit (HOSPITAL_COMMUNITY): Payer: Self-pay

## 2020-11-08 ENCOUNTER — Other Ambulatory Visit: Payer: Self-pay | Admitting: Physician Assistant

## 2020-11-08 ENCOUNTER — Other Ambulatory Visit: Payer: Self-pay

## 2020-11-08 ENCOUNTER — Other Ambulatory Visit: Payer: Self-pay | Admitting: Internal Medicine

## 2020-11-08 ENCOUNTER — Inpatient Hospital Stay: Payer: Self-pay

## 2020-11-08 ENCOUNTER — Inpatient Hospital Stay (HOSPITAL_BASED_OUTPATIENT_CLINIC_OR_DEPARTMENT_OTHER): Payer: Self-pay | Admitting: Internal Medicine

## 2020-11-08 VITALS — BP 120/81 | HR 102 | Temp 97.4°F | Resp 19 | Ht 68.0 in | Wt 179.6 lb

## 2020-11-08 VITALS — BP 134/88 | HR 100 | Temp 98.3°F | Resp 17

## 2020-11-08 DIAGNOSIS — Z5111 Encounter for antineoplastic chemotherapy: Secondary | ICD-10-CM

## 2020-11-08 DIAGNOSIS — C9000 Multiple myeloma not having achieved remission: Secondary | ICD-10-CM

## 2020-11-08 DIAGNOSIS — Z95828 Presence of other vascular implants and grafts: Secondary | ICD-10-CM

## 2020-11-08 LAB — CBC WITH DIFFERENTIAL (CANCER CENTER ONLY)
Abs Immature Granulocytes: 0.01 10*3/uL (ref 0.00–0.07)
Basophils Absolute: 0 10*3/uL (ref 0.0–0.1)
Basophils Relative: 1 %
Eosinophils Absolute: 0.2 10*3/uL (ref 0.0–0.5)
Eosinophils Relative: 5 %
HCT: 31.1 % — ABNORMAL LOW (ref 36.0–46.0)
Hemoglobin: 11 g/dL — ABNORMAL LOW (ref 12.0–15.0)
Immature Granulocytes: 0 %
Lymphocytes Relative: 18 %
Lymphs Abs: 0.7 10*3/uL (ref 0.7–4.0)
MCH: 29.2 pg (ref 26.0–34.0)
MCHC: 35.4 g/dL (ref 30.0–36.0)
MCV: 82.5 fL (ref 80.0–100.0)
Monocytes Absolute: 0.4 10*3/uL (ref 0.1–1.0)
Monocytes Relative: 10 %
Neutro Abs: 2.7 10*3/uL (ref 1.7–7.7)
Neutrophils Relative %: 66 %
Platelet Count: 51 10*3/uL — ABNORMAL LOW (ref 150–400)
RBC: 3.77 MIL/uL — ABNORMAL LOW (ref 3.87–5.11)
RDW: 14.7 % (ref 11.5–15.5)
WBC Count: 4 10*3/uL (ref 4.0–10.5)
nRBC: 0 % (ref 0.0–0.2)

## 2020-11-08 LAB — CMP (CANCER CENTER ONLY)
ALT: 12 U/L (ref 0–44)
AST: 19 U/L (ref 15–41)
Albumin: 3.6 g/dL (ref 3.5–5.0)
Alkaline Phosphatase: 75 U/L (ref 38–126)
Anion gap: 10 (ref 5–15)
BUN: 35 mg/dL — ABNORMAL HIGH (ref 8–23)
CO2: 24 mmol/L (ref 22–32)
Calcium: 8.8 mg/dL — ABNORMAL LOW (ref 8.9–10.3)
Chloride: 102 mmol/L (ref 98–111)
Creatinine: 2.32 mg/dL — ABNORMAL HIGH (ref 0.44–1.00)
GFR, Estimated: 22 mL/min — ABNORMAL LOW (ref >60)
Glucose, Bld: 102 mg/dL — ABNORMAL HIGH (ref 70–99)
Potassium: 3.9 mmol/L (ref 3.5–5.1)
Sodium: 136 mmol/L (ref 135–145)
Total Bilirubin: 0.9 mg/dL (ref 0.3–1.2)
Total Protein: 5.7 g/dL — ABNORMAL LOW (ref 6.5–8.1)

## 2020-11-08 MED ORDER — SODIUM CHLORIDE 0.9 % IV SOLN: Freq: Once | INTRAVENOUS | Status: AC

## 2020-11-08 MED ORDER — METHYLPREDNISOLONE SODIUM SUCC 125 MG IJ SOLR
INTRAMUSCULAR | Status: AC
  Administered 2020-11-08: 100 mg via INTRAVENOUS
  Filled 2020-11-08: qty 2

## 2020-11-08 MED ORDER — ACETAMINOPHEN 325 MG PO TABS
ORAL_TABLET | ORAL | Status: AC
  Administered 2020-11-08: 650 mg via ORAL
  Filled 2020-11-08: qty 2

## 2020-11-08 MED ORDER — SODIUM CHLORIDE 0.9% FLUSH
10.0000 mL | INTRAVENOUS | Status: DC | PRN
Start: 2020-11-08 — End: 2020-11-08
  Administered 2020-11-08: 10 mL

## 2020-11-08 MED ORDER — SODIUM CHLORIDE 0.9 % IV SOLN
16.0000 mg/kg | Freq: Once | INTRAVENOUS | Status: AC
  Administered 2020-11-08: 1400 mg via INTRAVENOUS
  Filled 2020-11-08: qty 60

## 2020-11-08 MED ORDER — DIPHENHYDRAMINE HCL 25 MG PO CAPS: 50.0000 mg | ORAL_CAPSULE | Freq: Once | ORAL | Status: AC

## 2020-11-08 MED ORDER — FUROSEMIDE 40 MG PO TABS
ORAL_TABLET | Freq: Two times a day (BID) | ORAL | 3 refills | Status: DC
  Filled 2020-11-08: qty 60, 30d supply, fill #0
  Filled 2020-12-12: qty 60, 30d supply, fill #1
  Filled 2021-01-07: qty 60, 30d supply, fill #2
  Filled 2021-02-12: qty 60, 30d supply, fill #3

## 2020-11-08 MED ORDER — METHYLPREDNISOLONE SODIUM SUCC 125 MG IJ SOLR: 100.0000 mg | Freq: Once | INTRAMUSCULAR | Status: AC

## 2020-11-08 MED ORDER — HEPARIN SOD (PORK) LOCK FLUSH 100 UNIT/ML IV SOLN
500.0000 [IU] | Freq: Once | INTRAVENOUS | Status: AC | PRN
  Administered 2020-11-08: 500 [IU]

## 2020-11-08 MED ORDER — DIPHENHYDRAMINE HCL 25 MG PO CAPS
ORAL_CAPSULE | ORAL | Status: AC
  Administered 2020-11-08: 50 mg via ORAL
  Filled 2020-11-08: qty 2

## 2020-11-08 MED ORDER — SODIUM CHLORIDE 0.9% FLUSH
10.0000 mL | INTRAVENOUS | Status: DC | PRN
  Administered 2020-11-08: 10 mL

## 2020-11-08 MED ORDER — HYDROCODONE-ACETAMINOPHEN 5-325 MG PO TABS
1.0000 | ORAL_TABLET | Freq: Four times a day (QID) | ORAL | 0 refills | Status: DC | PRN
  Filled 2020-11-08: qty 30, 8d supply, fill #0

## 2020-11-08 MED ORDER — ACETAMINOPHEN 325 MG PO TABS: 650.0000 mg | ORAL_TABLET | Freq: Once | ORAL | Status: AC

## 2020-11-08 NOTE — Patient Instructions (Signed)
[facility]  Discharge Instructions: °Thank you for choosing [facility] to provide your oncology and hematology care.  ° °If you have a lab appointment with the Cancer Center, please go directly to the Cancer Center and check in at the registration area. °  °Wear comfortable clothing and clothing appropriate for easy access to any Portacath or PICC line.  ° °We strive to give you quality time with your provider. You may need to reschedule your appointment if you arrive late (15 or more minutes).  Arriving late affects you and other patients whose appointments are after yours.  Also, if you miss three or more appointments without notifying the office, you may be dismissed from the clinic at the provider’s discretion.    °  °For prescription refill requests, have your pharmacy contact our office and allow 72 hours for refills to be completed.   ° °Today you received the following chemotherapy and/or immunotherapy agents: Daratumumab.     °  °To help prevent nausea and vomiting after your treatment, we encourage you to take your nausea medication as directed. ° °BELOW ARE SYMPTOMS THAT SHOULD BE REPORTED IMMEDIATELY: °*FEVER GREATER THAN 100.4 F (38 °C) OR HIGHER °*CHILLS OR SWEATING °*NAUSEA AND VOMITING THAT IS NOT CONTROLLED WITH YOUR NAUSEA MEDICATION °*UNUSUAL SHORTNESS OF BREATH °*UNUSUAL BRUISING OR BLEEDING °*URINARY PROBLEMS (pain or burning when urinating, or frequent urination) °*BOWEL PROBLEMS (unusual diarrhea, constipation, pain near the anus) °TENDERNESS IN MOUTH AND THROAT WITH OR WITHOUT PRESENCE OF ULCERS (sore throat, sores in mouth, or a toothache) °UNUSUAL RASH, SWELLING OR PAIN  °UNUSUAL VAGINAL DISCHARGE OR ITCHING  ° °Items with * indicate a potential emergency and should be followed up as soon as possible or go to the Emergency Department if any problems should occur. ° °Please show the CHEMOTHERAPY ALERT CARD or IMMUNOTHERAPY ALERT CARD at check-in  to the Emergency Department and triage nurse. ° °Should you have questions after your visit or need to cancel or reschedule your appointment, please contact [facility]  Dept: [phone]  and follow the prompts.  Office hours are 8:00 a.m. to 4:30 p.m. Monday - Friday. Please note that voicemails left after 4:00 p.m. may not be returned until the following business day.  We are closed weekends and major holidays. You have access to a nurse at all times for urgent questions. Please call the main number to the clinic Dept: [phone] and follow the prompts. ° ° °For any non-urgent questions, you may also contact your provider using MyChart. We now offer e-Visits for anyone 18 and older to request care online for non-urgent symptoms. For details visit [email or web address]. °  °Also download the MyChart app! Go to the app store, search "MyChart", open the app, select [facility], and log in with your MyChart username and password. ° °Due to Covid, a mask is required upon entering the hospital/clinic. If you do not have a mask, one will be given to you upon arrival. For doctor visits, patients may have 1 support person aged 18 or older with them. For treatment visits, patients cannot have anyone with them due to current Covid guidelines and our immunocompromised population.  ° °

## 2020-11-08 NOTE — Progress Notes (Signed)
Per Dr. Julien Nordmann, "OK To Treat w/elevated Cr+ and low plts."

## 2020-11-08 NOTE — Progress Notes (Signed)
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  PRIOR THERAPY: 1) Systemic therapy with Velcade 1.3 mg/M2 weekly, Revlimid 25 mg p.o. daily for 14 days every 3 weeks in addition to weekly Decadron 40 mg orally. First dose of treatment 10/08/2017.  Treatment was placed on hold due to development of a significant rash.  She resumed treatment with only dexamethasone and Velcade on 11/19/2017. This was discontinued on 04/14/2019 due to evidence of disease progression.  2) Pomalyst 4 mg p.o. for 21 days every 4 weeks and 40 mg p.o. Decadron once a week. First dose 04/29/2019-05/04/2019. Status post 6 days of treatment. Discontinued secondary to allergy to Pomalyst.  3) Chemotherapy with carfilzomib on days 1, 2, 8, 9, 15, and 16 and Cytoxan 300 mg/m2 on days 1, 8, and 15 IV every 4 weeks and 40 mg p.o. weekly of Decadron. Last dose expected on 03/02/2020. Status post 11 cycles. Discontinued due to intolerance  CURRENT THERAPY: Chemotherapy with Daratumumab on days 1, 8, 15, and 22 IV every 4 weeks and 20 mg p.o. weekly of Decadron. First dose on 05/17/2020.  Starting from cycle #3 she is on treatment every 2 weeks.  Status post 6 cycles.  INTERVAL HISTORY: Debbie Chan Sick 70 y.o. female returns to the clinic today for follow-up visit.  The patient is feeling fine today with no concerning complaints except for the swelling and rash in the lower extremities.  She is applying moisturizing lotions to this area with mild improvement.  She also takes some Lasix for the swelling of the lower extremities.  She continues to complain of the abdominal bloating and enlargement likely secondary to her long-term treatment with steroids.  The patient has no current chest pain, shortness of breath, cough or hemoptysis.  She denied having any  fever or chills.  She has no nausea, vomiting, diarrhea or constipation.  She has no headache or visual changes.  She continues to tolerate her treatment with daratumumab and Decadron fairly well.  She had repeat myeloma panel last week 10 the patient is here today for evaluation before starting cycle #7.  MEDICAL HISTORY: Past Medical History:  Diagnosis Date   Cancer ([facility])     ALLERGIES:  has No Known Allergies.  MEDICATIONS:  Current Outpatient Medications  Medication Sig Dispense Refill   acyclovir (ZOVIRAX) 200 MG capsule TAKE 1 CAPSULE BY MOUTH TWICE DAILY 60 capsule 4   amLODipine (NORVASC) 2.5 MG tablet TAKE 1 TABLET BY MOUTH ONCE A DAY 30 tablet 3   azithromycin (ZITHROMAX Z-PAK) 250 MG tablet Take 2 tablets day 1 and then 1 tablet daily days 2 through 5 (Patient taking differently: Take 2 tablets day 1 and then 1 tablet daily days 2 through 5) 6 each 0   benzonatate (TESSALON) 100 MG capsule Take 1 capsule (100 mg total) by mouth 3 (three) times daily as needed for cough. 21 capsule 0   blood glucose meter kit and supplies Dispense based on patient and insurance preference. Use up to four times daily as directed. (FOR ICD-10 E10.9, E11.9). 1 each 0   carvedilol (COREG) 12.5 MG tablet TAKE 1 TABLET BY MOUTH 2 TIMES DAILY WITH A MEAL 60 tablet 3   carvedilol (COREG) 12.5 MG tablet TAKE 1 TABLET BY MOUTH 2 TIMES DAILY WITH A MEAL 60 tablet 3  dexamethasone (DECADRON) 4 MG tablet TAKE 5 TABLETS BY MOUTH ONCE A WEEK ON THE DAY OF TREATMENT 150 tablet 2   ferrous sulfate 325 (65 FE) MG tablet TAKE 1 TABLET BY MOUTH ONCE A DAY WITH BREAKFAST 30 tablet 3   furosemide (LASIX) 40 MG tablet TAKE 1 TABLET BY MOUTH 2 TIMES DAILY 60 tablet 3   hydrALAZINE (APRESOLINE) 100 MG tablet TAKE 1 TABLET BY MOUTH EVERY 8 HOURS 90 tablet 3   HYDROcodone-acetaminophen (NORCO) 5-325 MG tablet Take 1 tablet by mouth every 6 (six) hours as needed for moderate pain. 30 tablet 0   hydrocortisone 2.5 % cream  Apply topically 2 (two) times daily as needed. 454 g 0   hydrOXYzine (ATARAX/VISTARIL) 10 MG tablet Take 1 tablet (10 mg total) by mouth at bedtime. 30 tablet 0   isosorbide mononitrate (IMDUR) 30 MG 24 hr tablet TAKE 1 TABLET BY MOUTH ONCE A DAY 30 tablet 3   lidocaine-prilocaine (EMLA) cream Apply topically as needed 30 g 1   loratadine (CLARITIN) 10 MG tablet Take 10 mg by mouth daily.     MELATONIN PO Take 10 mg by mouth at bedtime as needed (sleep).     omeprazole (PRILOSEC) 20 MG capsule TAKE 1 CAPSULE BY MOUTH ONCE DAILY 30 capsule 4   ReliOn Ultra Thin Lancets 30G MISC USE 1 TO CHECK GLUCOSE ONCE DAILY FOR GLUCOSE TESTING     No current facility-administered medications for this visit.   Facility-Administered Medications Ordered in Other Visits  Medication Dose Route Frequency Provider Last Rate Last Admin   sodium chloride flush (NS) 0.9 % injection 10 mL  10 mL Intracatheter PRN Curt Bears, MD   10 mL at 02/22/20 1127   sodium chloride flush (NS) 0.9 % injection 10 mL  10 mL Intracatheter PRN Curt Bears, MD   10 mL at 07/19/20 1630   sodium chloride flush (NS) 0.9 % injection 10 mL  10 mL Intracatheter PRN Curt Bears, MD   10 mL at 11/08/20 0755    SURGICAL HISTORY:  Past Surgical History:  Procedure Laterality Date   IR IMAGING GUIDED PORT INSERTION  05/10/2019    REVIEW OF SYSTEMS:  Constitutional: positive for fatigue Eyes: negative Ears, nose, mouth, throat, and face: negative Respiratory: negative Cardiovascular: negative Gastrointestinal: negative Genitourinary:negative Integument/breast: negative Hematologic/lymphatic: negative Musculoskeletal:positive for arthralgias Neurological: negative Behavioral/Psych: negative Endocrine: negative Allergic/Immunologic: negative   PHYSICAL EXAMINATION: General appearance: alert, cooperative, fatigued, and no distress Head: Normocephalic, without obvious abnormality, atraumatic Neck: no adenopathy, no  JVD, supple, symmetrical, trachea midline, and thyroid not enlarged, symmetric, no tenderness/mass/nodules Lymph nodes: Cervical, supraclavicular, and axillary nodes normal. Resp: clear to auscultation bilaterally Back: symmetric, no curvature. ROM normal. No CVA tenderness. Cardio: regular rate and rhythm, S1, S2 normal, no murmur, click, rub or gallop GI: soft, non-tender; bowel sounds normal; no masses,  no organomegaly Extremities: extremities normal, atraumatic, no cyanosis or edema Neurologic: Alert and oriented X 3, normal strength and tone. Normal symmetric reflexes. Normal coordination and gait  ECOG PERFORMANCE STATUS: 1 - Symptomatic but completely ambulatory  Blood pressure 120/81, pulse (!) 102, temperature (!) 97.4 F (36.3 C), temperature source Tympanic, resp. rate 19, height '5\' 8"'  (1.727 m), weight 179 lb 9.6 oz (81.5 kg), SpO2 100 %.  LABORATORY DATA: Lab Results  Component Value Date   WBC 3.3 (L) 10/25/2020   HGB 11.6 (L) 10/25/2020   HCT 31.6 (L) 10/25/2020   MCV 82.5 10/25/2020   PLT 57 (L) 10/25/2020  Chemistry      Component Value Date/Time   NA 136 10/25/2020 1027   NA 132 (L) 04/17/2020 1612   K 3.6 10/25/2020 1027   CL 100 10/25/2020 1027   CO2 25 10/25/2020 1027   BUN 25 (H) 10/25/2020 1027   BUN 46 (H) 04/17/2020 1612   CREATININE 2.24 (H) 10/25/2020 1027      Component Value Date/Time   CALCIUM 9.1 10/25/2020 1027   ALKPHOS 65 10/25/2020 1027   AST 18 10/25/2020 1027   ALT 8 10/25/2020 1027   BILITOT 0.8 10/25/2020 1027       RADIOGRAPHIC STUDIES: No results found.  ASSESSMENT AND PLAN: This is a very pleasant 70 years old African female originally from [place] who is visiting her son in [place] and was recently diagnosed with IgA multiple myeloma. The patient was a started initially on treatment with weekly subcutaneous Velcade 1.3 mg/M2, Revlimid 25 mg p.o. daily for 21 days every 4 weeks as well as weekly  Decadron 40 mg orally.  Revlimid was discontinued secondary to hypersensitivity reaction with significant skin rash.  She is currently on treatment with weekly Velcade and Decadron. Her treatment was discontinued secondary to disease progression.  The patient started treatment with Pomalyst and Decadron but this was discontinued secondary to hypersensitivity reaction to the Pomalyst. She underwent systemic chemotherapy with carfilzomib, Cytoxan and Decadron status post 11 cycles. The patient tolerated the previous treatment well but this was discontinued secondary to disease progression as well as recent hospitalization with COVID-19 infection and pneumonia.  She is recovering well but she has not received any treatment for the last 6 weeks or more. She started treatment with daratumumab and Decadron status post 6 cycles.  The patient has been tolerating this treatment well with no concerning complaints except for the mild rash in the lower extremities as well as the abdominal bloating. She had repeat myeloma panel performed recently.  I discussed the lab results with the patient.  It showed a stable disease at this point except for mild further increase in the free lambda light chain. I recommended for the patient to continue her current treatment with daratumumab and Decadron. I will see her back for follow-up visit in 4 weeks for evaluation before the next cycle of her treatment. I will give the patient refill of Lasix and Norco today. She is still in the process of trying to travel back home to [place] but she has not decided on the time yet. The patient was advised to call immediately if she has any other concerning symptoms in the interval The patient voices understanding of current disease status and treatment options and is in agreement with the current care plan.  All questions were answered. The patient knows to call the clinic with any problems, questions or concerns. We can certainly see  the patient much sooner if necessary.  The total time spent in the appointment was 30 minutes.  Disclaimer: This note was dictated with voice recognition software. Similar sounding words can inadvertently be transcribed and may not be corrected upon review.

## 2020-11-23 ENCOUNTER — Encounter: Payer: Self-pay | Admitting: Internal Medicine

## 2020-11-23 ENCOUNTER — Other Ambulatory Visit (HOSPITAL_COMMUNITY): Payer: Self-pay

## 2020-11-23 ENCOUNTER — Other Ambulatory Visit (HOSPITAL_COMMUNITY): Payer: Self-pay | Admitting: Internal Medicine

## 2020-11-23 ENCOUNTER — Telehealth (HOSPITAL_COMMUNITY): Payer: Self-pay | Admitting: Vascular Surgery

## 2020-11-23 NOTE — Telephone Encounter (Signed)
Pt left VM on scheduling line for refill from Dr. Haroldine Laws  this pt has never been seen in this office by any of our providers, returned pt call , to inform pt to that she has never been seen here and our provide can refill medications( left on vm)

## 2020-11-26 ENCOUNTER — Other Ambulatory Visit (HOSPITAL_COMMUNITY): Payer: Self-pay

## 2020-11-26 ENCOUNTER — Other Ambulatory Visit (HOSPITAL_COMMUNITY): Payer: Self-pay | Admitting: Internal Medicine

## 2020-11-27 ENCOUNTER — Encounter: Payer: Self-pay | Admitting: Internal Medicine

## 2020-11-27 ENCOUNTER — Other Ambulatory Visit (HOSPITAL_COMMUNITY): Payer: Self-pay

## 2020-11-28 ENCOUNTER — Other Ambulatory Visit (HOSPITAL_COMMUNITY): Payer: Self-pay

## 2020-11-28 NOTE — Telephone Encounter (Signed)
Patient has never been seen by Dr. Haroldine Laws, patient is currently a patient at the [facility] office

## 2020-11-29 ENCOUNTER — Encounter: Payer: Self-pay | Admitting: Internal Medicine

## 2020-11-29 ENCOUNTER — Other Ambulatory Visit (HOSPITAL_COMMUNITY): Payer: Self-pay

## 2020-11-29 MED ORDER — HYDRALAZINE HCL 100 MG PO TABS
ORAL_TABLET | Freq: Three times a day (TID) | ORAL | 3 refills | Status: DC
  Filled 2020-11-29: qty 90, 30d supply, fill #0
  Filled 2020-12-12: qty 90, 30d supply, fill #1
  Filled 2021-02-01: qty 90, 30d supply, fill #2
  Filled 2021-02-27: qty 90, 30d supply, fill #3

## 2020-11-29 MED ORDER — AMLODIPINE BESYLATE 2.5 MG PO TABS
ORAL_TABLET | Freq: Every day | ORAL | 3 refills | Status: DC
  Filled 2020-11-29: qty 90, 90d supply, fill #0

## 2020-11-29 MED ORDER — ISOSORBIDE MONONITRATE ER 30 MG PO TB24
ORAL_TABLET | Freq: Every day | ORAL | 3 refills | Status: AC
  Filled 2020-11-29: qty 90, 90d supply, fill #0
  Filled 2021-02-12: qty 90, 90d supply, fill #1

## 2020-11-29 NOTE — Telephone Encounter (Signed)
Paient never saw Dr. Haroldine Laws and currently does not have an appointment scheduled with him

## 2020-11-30 NOTE — Progress Notes (Signed)
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  PRIOR THERAPY: 1) Systemic therapy with Velcade 1.3 mg/M2 weekly, Revlimid 25 mg p.o. daily for 14 days every 3 weeks in addition to weekly Decadron 40 mg orally. First dose of treatment 10/08/2017.  Treatment was placed on hold due to development of a significant rash.  She resumed treatment with only dexamethasone and Velcade on 11/19/2017. This was discontinued on 04/14/2019 due to evidence of disease progression.  2) Pomalyst 4 mg p.o. for 21 days every 4 weeks and 40 mg p.o. Decadron once a week. First dose 04/29/2019-05/04/2019. Status post 6 days of treatment. Discontinued secondary to allergy to Pomalyst.  3) Chemotherapy with carfilzomib on days 1, 2, 8, 9, 15, and 16 and Cytoxan 300 mg/m2 on days 1, 8, and 15 IV every 4 weeks and 40 mg p.o. weekly of Decadron. Last dose expected on 03/02/2020. Status post 11 cycles. Discontinued due to intolerance  CURRENT THERAPY: Chemotherapy with Daratumumab on days 1, 8, 15, and 22 IV every 4 weeks and 20 mg p.o. weekly of Decadron. First dose on 05/17/2020.  Status post 8 cycles.  Starting from cycle #3, the patient was on treatment for every 2 weeks.  Starting from cycle number 7 she is on treatment every 28 days.  INTERVAL HISTORY: Debbie Chan 70 y.o. female returns to the clinic today for a follow-up visit.  The patient is feeling fair today.   She is tolerating her treatment fairly well except she has some itching for which she uses hydrocortisone cream.  She also has a prescription for Atarax if needed.  At her last appointment, she was endorsing lower extremity swelling.  Dr. Julien Nordmann refilled her Lasix in which her swelling is improved at this time.  He also refilled her Norco for her low back and hip pain.  She continues to have left-sided back pain and bloating today.   Unclear if she has been using Tylenol.  Her pain is exacerbated by movement.  Denies any recent falls or injuries.  She reports continued abdominal bloating which is likely secondary to her steroid use.  She denies any recent fever, chills, night sweats, or unexplained weight loss.  She denies any abnormal bleeding or bruising.  She denies any recent infections including upper respiratory infections, skin infections, or burning with urination.  She is here today for evaluation and repeat blood work before starting cycle #8.    MEDICAL HISTORY: Past Medical History:  Diagnosis Date   Cancer ([facility])     ALLERGIES:  has No Known Allergies.  MEDICATIONS:  Current Outpatient Medications  Medication Sig Dispense Refill   blood glucose meter kit and supplies Dispense based on patient and insurance preference. Use up to four times daily as directed. (FOR ICD-10 E10.9, E11.9). 1 each 0   carvedilol (COREG) 12.5 MG tablet TAKE 1 TABLET BY MOUTH 2 TIMES DAILY WITH A MEAL 60 tablet 3   dexamethasone (DECADRON) 4 MG tablet TAKE 5 TABLETS BY MOUTH ONCE A WEEK ON THE DAY OF TREATMENT 150 tablet 2   ferrous sulfate 325 (65 FE) MG tablet TAKE 1 TABLET BY MOUTH ONCE A DAY WITH BREAKFAST 30 tablet 3   furosemide (LASIX) 40 MG tablet TAKE 1 TABLET BY MOUTH 2 TIMES DAILY 60 tablet 3   hydrALAZINE (APRESOLINE) 100 MG tablet TAKE 1 TABLET BY MOUTH EVERY 8 HOURS 90  tablet 3   hydrOXYzine (ATARAX/VISTARIL) 10 MG tablet Take 1 tablet (10 mg total) by mouth at bedtime. 30 tablet 0   isosorbide mononitrate (IMDUR) 30 MG 24 hr tablet TAKE 1 TABLET BY MOUTH ONCE A DAY 90 tablet 3   lidocaine-prilocaine (EMLA) cream Apply topically as needed 30 g 1   omeprazole (PRILOSEC) 20 MG capsule TAKE 1 CAPSULE BY MOUTH ONCE DAILY 30 capsule 4   Probiotic Product (RESTORA PO) Take 1 tablet by mouth daily. Over the counter Restora 174m -400 mg-4 Take 1 tablet by mouth daily for stomach gas.     ReliOn Ultra Thin Lancets 30G MISC USE  1 TO CHECK GLUCOSE ONCE DAILY FOR GLUCOSE TESTING     HYDROcodone-acetaminophen (NORCO) 5-325 MG tablet Take 1 tablet by mouth every 6 (six) hours as needed for moderate pain. (Patient not taking: Reported on 12/06/2020) 30 tablet 0   loratadine (CLARITIN) 10 MG tablet Take 10 mg by mouth daily.     MELATONIN PO Take 10 mg by mouth at bedtime as needed (sleep).     No current facility-administered medications for this visit.   Facility-Administered Medications Ordered in Other Visits  Medication Dose Route Frequency Provider Last Rate Last Admin   sodium chloride flush (NS) 0.9 % injection 10 mL  10 mL Intracatheter PRN MCurt Bears MD   10 mL at 02/22/20 1127   sodium chloride flush (NS) 0.9 % injection 10 mL  10 mL Intracatheter PRN MCurt Bears MD   10 mL at 07/19/20 1630    SURGICAL HISTORY:  Past Surgical History:  Procedure Laterality Date   IR IMAGING GUIDED PORT INSERTION  05/10/2019    REVIEW OF SYSTEMS:   Constitutional:Positive for fatigue. Negative for appetite change, chills, fever and unexpected weight change. HENT: Negative for mouth sores, nosebleeds, sore throat and trouble swallowing.   Eyes: Negative for eye problems and icterus. Respiratory: Positive for shortness of breath with bending over. Negative for cough, hemoptysis, and wheezing.   Cardiovascular: Negative for chest pain and leg swelling. Gastrointestinal: Negative for abdominal pain, constipation, diarrhea, nausea and vomiting. Genitourinary: Negative for bladder incontinence, difficulty urinating, dysuria, frequency and hematuria.   Musculoskeletal: Positive for left hip pain. Negative for back pain, gait problem, neck pain and neck stiffness. Skin: Generalized itching. Neurological: Negative for dizziness, extremity weakness, gait problem, headaches, light-headedness and seizures. Hematological: Negative for adenopathy. Does not bruise/bleed easily. Psychiatric/Behavioral: Negative for confusion,  depression and sleep disturbance. The patient is not nervous/anxious.       PHYSICAL EXAMINATION:  Blood pressure 125/70, pulse 98, temperature 98.2 F (36.8 C), temperature source Oral, resp. rate 18, weight 177 lb 12.8 oz (80.6 kg), SpO2 98 %.  ECOG PERFORMANCE STATUS: 1  Physical Exam  Constitutional: Oriented to person, place, and time and well-developed, well-nourished, and in no distress. HENT: Head: Normocephalic and atraumatic. Mouth/Throat: Oropharynx is clear and moist. No oropharyngeal exudate. Eyes: Conjunctivae are normal. Right eye exhibits no discharge. Left eye exhibits no discharge. No scleral icterus. Neck: Normal range of motion. Neck supple. Cardiovascular: Normal rate, regular rhythm, normal heart sounds and intact distal pulses.   Pulmonary/Chest: Effort normal and breath sounds normal. No respiratory distress. No wheezes. No rales. Abdominal: Soft. Bowel sounds are normal. Exhibits no distension and no mass. There is no tenderness.  Musculoskeletal: Normal range of motion. Exhibits no edema.  Lymphadenopathy:    No cervical adenopathy.  Neurological: Alert and oriented to person, place, and time. Exhibits normal muscle tone. Gait  normal. Coordination normal. Skin: Shiny and darkened appearance of her lower extremities bilaterally with a few scattered skin bumps. No warmth or pain. Skin is warm and dry. Not diaphoretic. No erythema. No pallor.  Psychiatric: Mood, memory and judgment normal. Vitals reviewed.  LABORATORY DATA: Lab Results  Component Value Date   WBC 3.7 (L) 12/06/2020   HGB 10.7 (L) 12/06/2020   HCT 30.9 (L) 12/06/2020   MCV 84.0 12/06/2020   PLT 64 (L) 12/06/2020      Chemistry      Component Value Date/Time   NA 136 11/08/2020 0750   NA 132 (L) 04/17/2020 1612   K 3.9 11/08/2020 0750   CL 102 11/08/2020 0750   CO2 24 11/08/2020 0750   BUN 35 (H) 11/08/2020 0750   BUN 46 (H) 04/17/2020 1612   CREATININE 2.32 (H) 11/08/2020 0750       Component Value Date/Time   CALCIUM 8.8 (L) 11/08/2020 0750   ALKPHOS 75 11/08/2020 0750   AST 19 11/08/2020 0750   ALT 12 11/08/2020 0750   BILITOT 0.9 11/08/2020 0750       RADIOGRAPHIC STUDIES:  No results found.   ASSESSMENT/PLAN:  This is a very pleasant 70 year old African female from [place] who was visiting her son in [place] and was diagnosed with IgA multiple myeloma in 2019.    The patient was a started initially on treatment with weekly subcutaneous Velcade 1.3 mg/M2, Revlimid 25 mg p.o. daily for 21 days every 4 weeks as well as weekly Decadron 40 mg orally.  She was tolerating the treatment well but she developed significant skin rash secondary to treatment with Revlimid and this was discontinued.   The patient continued treatment with subcutaneous Velcade and Decadron.  She developed evidence of disease progression in February 2021.  Her treatment was discontinued.   The patient was then started on Pomalyst 4 mg p.o. daily for 21 days on every 4 weeks with weekly Decadron 40 mg p.o.  She is status post 6 days of treatment.  She developed a significant skin rash/reaction to Pomalyst.  This was discontinued.   The patient was then undergoing treatment with Carfilzomib on days 1, 2, 8, 9, 15, and 16 and Cytoxan on days 1, 8, and 15 IV every 4 weeks with 40 mg p.o. weekly of Decadron. She is status post 11 cycles. This was discontinued due to intolerance.   The patient was recently hospitalized for colitis, COVID-19, acute kidney injury, thrombocytopenia, and heart failure. Unclear as to the etiology of the heart failure cannot exclude cardiotoxicity from carfilzomib.    She started treatment with daratumumab and Decadron status post 7 cycles. Starting from cycle #7, treatment is once every 28 days.    Labs reviewed. Her CMP is still pending. As long as her CMP is within normal limits, she is ok to proceed with cycle #8 today as scheduled.   I will make  her a follow-up visit 1 week before her next appointment to repeat her myeloma panel.  We will see her back for follow-up visit in 4 weeks for evaluation before starting cycle #9.  The patient was advised to continue to use a heating pad, Tylenol, or Norco for her back pain.  Advised to use Atarax and hydrocortisone cream for her generalized itching.  No evidence of rash.  She is still in the process of trying to figure out when she is going to travel back home to [place].  The patient was advised  to call immediately if she has any concerning symptoms in the interval. The patient voices understanding of current disease status and treatment options and is in agreement with the current care plan. All questions were answered. The patient knows to call the clinic with any problems, questions or concerns. We can certainly see the patient much sooner if necessary         Orders Placed This Encounter  Procedures   Lactate dehydrogenase (LDH)    Standing Status:   Future    Standing Expiration Date:   12/06/2021   Kappa/lambda light chains    Standing Status:   Future    Standing Expiration Date:   12/06/2021   Beta 2 microglobulin    Standing Status:   Future    Standing Expiration Date:   12/06/2021   QIG  (Quant. immunoglobulins  - IgG, IgA, IgM)    Standing Status:   Future    Standing Expiration Date:   12/06/2021    The total time spent in the appointment was 20-29 minutes  Johnni Wunschel L Mikayla Chiusano, PA-C 12/06/20

## 2020-12-06 ENCOUNTER — Inpatient Hospital Stay: Payer: Self-pay

## 2020-12-06 ENCOUNTER — Inpatient Hospital Stay: Payer: Self-pay | Attending: Internal Medicine | Admitting: Physician Assistant

## 2020-12-06 ENCOUNTER — Other Ambulatory Visit: Payer: Self-pay

## 2020-12-06 VITALS — BP 133/77 | HR 100 | Temp 99.1°F | Resp 16

## 2020-12-06 VITALS — BP 125/70 | HR 98 | Temp 98.2°F | Resp 18 | Wt 177.8 lb

## 2020-12-06 DIAGNOSIS — R140 Abdominal distension (gaseous): Secondary | ICD-10-CM | POA: Insufficient documentation

## 2020-12-06 DIAGNOSIS — C9000 Multiple myeloma not having achieved remission: Secondary | ICD-10-CM | POA: Insufficient documentation

## 2020-12-06 DIAGNOSIS — R21 Rash and other nonspecific skin eruption: Secondary | ICD-10-CM | POA: Insufficient documentation

## 2020-12-06 DIAGNOSIS — Z95828 Presence of other vascular implants and grafts: Secondary | ICD-10-CM

## 2020-12-06 DIAGNOSIS — Z8616 Personal history of COVID-19: Secondary | ICD-10-CM | POA: Insufficient documentation

## 2020-12-06 DIAGNOSIS — M549 Dorsalgia, unspecified: Secondary | ICD-10-CM | POA: Insufficient documentation

## 2020-12-06 DIAGNOSIS — I509 Heart failure, unspecified: Secondary | ICD-10-CM | POA: Insufficient documentation

## 2020-12-06 DIAGNOSIS — Z7952 Long term (current) use of systemic steroids: Secondary | ICD-10-CM | POA: Insufficient documentation

## 2020-12-06 DIAGNOSIS — Z9221 Personal history of antineoplastic chemotherapy: Secondary | ICD-10-CM | POA: Insufficient documentation

## 2020-12-06 DIAGNOSIS — Z5112 Encounter for antineoplastic immunotherapy: Secondary | ICD-10-CM | POA: Insufficient documentation

## 2020-12-06 DIAGNOSIS — Z7961 Long term (current) use of immunomodulator: Secondary | ICD-10-CM | POA: Insufficient documentation

## 2020-12-06 DIAGNOSIS — Z5111 Encounter for antineoplastic chemotherapy: Secondary | ICD-10-CM

## 2020-12-06 LAB — CMP (CANCER CENTER ONLY)
ALT: 11 U/L (ref 0–44)
AST: 19 U/L (ref 15–41)
Albumin: 3.6 g/dL (ref 3.5–5.0)
Alkaline Phosphatase: 69 U/L (ref 38–126)
Anion gap: 7 (ref 5–15)
BUN: 40 mg/dL — ABNORMAL HIGH (ref 8–23)
CO2: 26 mmol/L (ref 22–32)
Calcium: 8.7 mg/dL — ABNORMAL LOW (ref 8.9–10.3)
Chloride: 101 mmol/L (ref 98–111)
Creatinine: 2.36 mg/dL — ABNORMAL HIGH (ref 0.44–1.00)
GFR, Estimated: 22 mL/min — ABNORMAL LOW (ref >60)
Glucose, Bld: 156 mg/dL — ABNORMAL HIGH (ref 70–99)
Potassium: 3.5 mmol/L (ref 3.5–5.1)
Sodium: 134 mmol/L — ABNORMAL LOW (ref 135–145)
Total Bilirubin: 1 mg/dL (ref 0.3–1.2)
Total Protein: 5.6 g/dL — ABNORMAL LOW (ref 6.5–8.1)

## 2020-12-06 LAB — CBC WITH DIFFERENTIAL (CANCER CENTER ONLY)
Abs Immature Granulocytes: 0 10*3/uL (ref 0.00–0.07)
Basophils Absolute: 0 10*3/uL (ref 0.0–0.1)
Basophils Relative: 1 %
Eosinophils Absolute: 0.3 10*3/uL (ref 0.0–0.5)
Eosinophils Relative: 9 %
HCT: 30.9 % — ABNORMAL LOW (ref 36.0–46.0)
Hemoglobin: 10.7 g/dL — ABNORMAL LOW (ref 12.0–15.0)
Immature Granulocytes: 0 %
Lymphocytes Relative: 25 %
Lymphs Abs: 0.9 10*3/uL (ref 0.7–4.0)
MCH: 29.1 pg (ref 26.0–34.0)
MCHC: 34.6 g/dL (ref 30.0–36.0)
MCV: 84 fL (ref 80.0–100.0)
Monocytes Absolute: 0.4 10*3/uL (ref 0.1–1.0)
Monocytes Relative: 12 %
Neutro Abs: 2 10*3/uL (ref 1.7–7.7)
Neutrophils Relative %: 53 %
Platelet Count: 64 10*3/uL — ABNORMAL LOW (ref 150–400)
RBC: 3.68 MIL/uL — ABNORMAL LOW (ref 3.87–5.11)
RDW: 14.5 % (ref 11.5–15.5)
WBC Count: 3.7 10*3/uL — ABNORMAL LOW (ref 4.0–10.5)
nRBC: 0 % (ref 0.0–0.2)

## 2020-12-06 MED ORDER — SODIUM CHLORIDE 0.9% FLUSH
10.0000 mL | INTRAVENOUS | Status: DC | PRN
  Administered 2020-12-06: 10 mL

## 2020-12-06 MED ORDER — DIPHENHYDRAMINE HCL 25 MG PO CAPS
50.0000 mg | ORAL_CAPSULE | Freq: Once | ORAL | Status: AC
  Administered 2020-12-06: 50 mg via ORAL
  Filled 2020-12-06: qty 2

## 2020-12-06 MED ORDER — HEPARIN SOD (PORK) LOCK FLUSH 100 UNIT/ML IV SOLN
500.0000 [IU] | Freq: Once | INTRAVENOUS | Status: AC | PRN
  Administered 2020-12-06: 500 [IU]

## 2020-12-06 MED ORDER — METHYLPREDNISOLONE SODIUM SUCC 125 MG IJ SOLR
100.0000 mg | Freq: Once | INTRAMUSCULAR | Status: AC
  Administered 2020-12-06: 100 mg via INTRAVENOUS

## 2020-12-06 MED ORDER — SODIUM CHLORIDE 0.9 % IV SOLN: Freq: Once | INTRAVENOUS | Status: AC

## 2020-12-06 MED ORDER — ACETAMINOPHEN 325 MG PO TABS
650.0000 mg | ORAL_TABLET | Freq: Once | ORAL | Status: AC
  Administered 2020-12-06: 650 mg via ORAL
  Filled 2020-12-06: qty 2

## 2020-12-06 MED ORDER — SODIUM CHLORIDE 0.9 % IV SOLN
16.0000 mg/kg | Freq: Once | INTRAVENOUS | Status: AC
  Administered 2020-12-06: 1400 mg via INTRAVENOUS
  Filled 2020-12-06: qty 60

## 2020-12-06 NOTE — Progress Notes (Signed)
Per Cassie, PA, ok to treat with platelets of 64.

## 2020-12-06 NOTE — Patient Instructions (Signed)
[facility]  Discharge Instructions: °Thank you for choosing [facility] to provide your oncology and hematology care.  ° °If you have a lab appointment with the Cancer Center, please go directly to the Cancer Center and check in at the registration area. °  °Wear comfortable clothing and clothing appropriate for easy access to any Portacath or PICC line.  ° °We strive to give you quality time with your provider. You may need to reschedule your appointment if you arrive late (15 or more minutes).  Arriving late affects you and other patients whose appointments are after yours.  Also, if you miss three or more appointments without notifying the office, you may be dismissed from the clinic at the provider’s discretion.    °  °For prescription refill requests, have your pharmacy contact our office and allow 72 hours for refills to be completed.   ° °Today you received the following chemotherapy and/or immunotherapy agents: Daratumumab.     °  °To help prevent nausea and vomiting after your treatment, we encourage you to take your nausea medication as directed. ° °BELOW ARE SYMPTOMS THAT SHOULD BE REPORTED IMMEDIATELY: °*FEVER GREATER THAN 100.4 F (38 °C) OR HIGHER °*CHILLS OR SWEATING °*NAUSEA AND VOMITING THAT IS NOT CONTROLLED WITH YOUR NAUSEA MEDICATION °*UNUSUAL SHORTNESS OF BREATH °*UNUSUAL BRUISING OR BLEEDING °*URINARY PROBLEMS (pain or burning when urinating, or frequent urination) °*BOWEL PROBLEMS (unusual diarrhea, constipation, pain near the anus) °TENDERNESS IN MOUTH AND THROAT WITH OR WITHOUT PRESENCE OF ULCERS (sore throat, sores in mouth, or a toothache) °UNUSUAL RASH, SWELLING OR PAIN  °UNUSUAL VAGINAL DISCHARGE OR ITCHING  ° °Items with * indicate a potential emergency and should be followed up as soon as possible or go to the Emergency Department if any problems should occur. ° °Please show the CHEMOTHERAPY ALERT CARD or IMMUNOTHERAPY ALERT CARD at check-in  to the Emergency Department and triage nurse. ° °Should you have questions after your visit or need to cancel or reschedule your appointment, please contact [facility]  Dept: [phone]  and follow the prompts.  Office hours are 8:00 a.m. to 4:30 p.m. Monday - Friday. Please note that voicemails left after 4:00 p.m. may not be returned until the following business day.  We are closed weekends and major holidays. You have access to a nurse at all times for urgent questions. Please call the main number to the clinic Dept: [phone] and follow the prompts. ° ° °For any non-urgent questions, you may also contact your provider using MyChart. We now offer e-Visits for anyone 18 and older to request care online for non-urgent symptoms. For details visit [email or web address]. °  °Also download the MyChart app! Go to the app store, search "MyChart", open the app, select [facility], and log in with your MyChart username and password. ° °Due to Covid, a mask is required upon entering the hospital/clinic. If you do not have a mask, one will be given to you upon arrival. For doctor visits, patients may have 1 support person aged 18 or older with them. For treatment visits, patients cannot have anyone with them due to current Covid guidelines and our immunocompromised population.  ° °

## 2020-12-12 ENCOUNTER — Other Ambulatory Visit (HOSPITAL_COMMUNITY): Payer: Self-pay

## 2020-12-12 ENCOUNTER — Encounter: Payer: Self-pay | Admitting: Internal Medicine

## 2020-12-12 ENCOUNTER — Other Ambulatory Visit: Payer: Self-pay | Admitting: Internal Medicine

## 2020-12-12 DIAGNOSIS — C9000 Multiple myeloma not having achieved remission: Secondary | ICD-10-CM

## 2020-12-12 MED ORDER — HYDROCODONE-ACETAMINOPHEN 5-325 MG PO TABS
1.0000 | ORAL_TABLET | Freq: Four times a day (QID) | ORAL | 0 refills | Status: DC | PRN
  Filled 2020-12-12: qty 30, 8d supply, fill #0

## 2020-12-13 ENCOUNTER — Other Ambulatory Visit (HOSPITAL_COMMUNITY): Payer: Self-pay

## 2020-12-27 ENCOUNTER — Inpatient Hospital Stay: Payer: Self-pay | Attending: Internal Medicine

## 2020-12-27 ENCOUNTER — Inpatient Hospital Stay: Payer: Self-pay

## 2020-12-27 ENCOUNTER — Other Ambulatory Visit: Payer: Self-pay

## 2020-12-27 ENCOUNTER — Other Ambulatory Visit (HOSPITAL_COMMUNITY): Payer: Self-pay

## 2020-12-27 ENCOUNTER — Encounter: Payer: Self-pay | Admitting: Internal Medicine

## 2020-12-27 DIAGNOSIS — Z7952 Long term (current) use of systemic steroids: Secondary | ICD-10-CM | POA: Insufficient documentation

## 2020-12-27 DIAGNOSIS — C9000 Multiple myeloma not having achieved remission: Secondary | ICD-10-CM | POA: Insufficient documentation

## 2020-12-27 DIAGNOSIS — R5383 Other fatigue: Secondary | ICD-10-CM | POA: Insufficient documentation

## 2020-12-27 DIAGNOSIS — D696 Thrombocytopenia, unspecified: Secondary | ICD-10-CM | POA: Insufficient documentation

## 2020-12-27 DIAGNOSIS — R531 Weakness: Secondary | ICD-10-CM | POA: Insufficient documentation

## 2020-12-27 DIAGNOSIS — R21 Rash and other nonspecific skin eruption: Secondary | ICD-10-CM | POA: Insufficient documentation

## 2020-12-27 DIAGNOSIS — Z95828 Presence of other vascular implants and grafts: Secondary | ICD-10-CM

## 2020-12-27 DIAGNOSIS — Z23 Encounter for immunization: Secondary | ICD-10-CM | POA: Insufficient documentation

## 2020-12-27 LAB — CMP (CANCER CENTER ONLY)
ALT: 15 U/L (ref 0–44)
AST: 29 U/L (ref 15–41)
Albumin: 3.7 g/dL (ref 3.5–5.0)
Alkaline Phosphatase: 94 U/L (ref 38–126)
Anion gap: 13 (ref 5–15)
BUN: 44 mg/dL — ABNORMAL HIGH (ref 8–23)
CO2: 21 mmol/L — ABNORMAL LOW (ref 22–32)
Calcium: 8.7 mg/dL — ABNORMAL LOW (ref 8.9–10.3)
Chloride: 102 mmol/L (ref 98–111)
Creatinine: 2.76 mg/dL — ABNORMAL HIGH (ref 0.44–1.00)
GFR, Estimated: 18 mL/min — ABNORMAL LOW (ref >60)
Glucose, Bld: 101 mg/dL — ABNORMAL HIGH (ref 70–99)
Potassium: 3.7 mmol/L (ref 3.5–5.1)
Sodium: 136 mmol/L (ref 135–145)
Total Bilirubin: 0.8 mg/dL (ref 0.3–1.2)
Total Protein: 6 g/dL — ABNORMAL LOW (ref 6.5–8.1)

## 2020-12-27 LAB — CBC WITH DIFFERENTIAL (CANCER CENTER ONLY)
Abs Immature Granulocytes: 0.01 10*3/uL (ref 0.00–0.07)
Basophils Absolute: 0 10*3/uL (ref 0.0–0.1)
Basophils Relative: 0 %
Eosinophils Absolute: 0.3 10*3/uL (ref 0.0–0.5)
Eosinophils Relative: 10 %
HCT: 31 % — ABNORMAL LOW (ref 36.0–46.0)
Hemoglobin: 11.2 g/dL — ABNORMAL LOW (ref 12.0–15.0)
Immature Granulocytes: 0 %
Lymphocytes Relative: 27 %
Lymphs Abs: 0.9 10*3/uL (ref 0.7–4.0)
MCH: 30.2 pg (ref 26.0–34.0)
MCHC: 36.1 g/dL — ABNORMAL HIGH (ref 30.0–36.0)
MCV: 83.6 fL (ref 80.0–100.0)
Monocytes Absolute: 0.4 10*3/uL (ref 0.1–1.0)
Monocytes Relative: 13 %
Neutro Abs: 1.6 10*3/uL — ABNORMAL LOW (ref 1.7–7.7)
Neutrophils Relative %: 50 %
Platelet Count: 67 10*3/uL — ABNORMAL LOW (ref 150–400)
RBC: 3.71 MIL/uL — ABNORMAL LOW (ref 3.87–5.11)
RDW: 14.6 % (ref 11.5–15.5)
WBC Count: 3.3 10*3/uL — ABNORMAL LOW (ref 4.0–10.5)
nRBC: 0 % (ref 0.0–0.2)

## 2020-12-27 LAB — LACTATE DEHYDROGENASE: LDH: 223 U/L — ABNORMAL HIGH (ref 98–192)

## 2020-12-27 MED ORDER — HEPARIN SOD (PORK) LOCK FLUSH 100 UNIT/ML IV SOLN
500.0000 [IU] | Freq: Once | INTRAVENOUS | Status: AC | PRN
  Administered 2020-12-27: 500 [IU]

## 2020-12-27 MED ORDER — SODIUM CHLORIDE 0.9% FLUSH
10.0000 mL | INTRAVENOUS | Status: DC | PRN
  Administered 2020-12-27: 10 mL

## 2020-12-27 MED ORDER — INFLUENZA VAC A&B SA ADJ QUAD 0.5 ML IM PRSY
0.5000 mL | PREFILLED_SYRINGE | Freq: Once | INTRAMUSCULAR | Status: AC
  Administered 2020-12-27: 0.5 mL via INTRAMUSCULAR
  Filled 2020-12-27: qty 0.5

## 2020-12-28 LAB — IGG, IGA, IGM
IgA: 419 mg/dL — ABNORMAL HIGH (ref 87–352)
IgG (Immunoglobin G), Serum: 296 mg/dL — ABNORMAL LOW (ref 586–1602)
IgM (Immunoglobulin M), Srm: 20 mg/dL — ABNORMAL LOW (ref 26–217)

## 2020-12-28 LAB — KAPPA/LAMBDA LIGHT CHAINS
Kappa free light chain: 9.4 mg/L (ref 3.3–19.4)
Kappa, lambda light chain ratio: 0.01 — ABNORMAL LOW (ref 0.26–1.65)
Lambda free light chains: 663.6 mg/L — ABNORMAL HIGH (ref 5.7–26.3)

## 2020-12-28 LAB — BETA 2 MICROGLOBULIN, SERUM: Beta-2 Microglobulin: 5.7 mg/L — ABNORMAL HIGH (ref 0.6–2.4)

## 2021-01-03 ENCOUNTER — Inpatient Hospital Stay: Payer: Self-pay

## 2021-01-03 ENCOUNTER — Encounter: Payer: Self-pay | Admitting: Internal Medicine

## 2021-01-03 ENCOUNTER — Inpatient Hospital Stay (HOSPITAL_BASED_OUTPATIENT_CLINIC_OR_DEPARTMENT_OTHER): Payer: Self-pay | Admitting: Internal Medicine

## 2021-01-03 ENCOUNTER — Other Ambulatory Visit: Payer: Self-pay

## 2021-01-03 VITALS — BP 101/63 | HR 62 | Temp 98.9°F | Resp 19 | Ht 68.0 in | Wt 177.5 lb

## 2021-01-03 DIAGNOSIS — Z5111 Encounter for antineoplastic chemotherapy: Secondary | ICD-10-CM

## 2021-01-03 DIAGNOSIS — C9000 Multiple myeloma not having achieved remission: Secondary | ICD-10-CM

## 2021-01-03 DIAGNOSIS — Z95828 Presence of other vascular implants and grafts: Secondary | ICD-10-CM

## 2021-01-03 LAB — CMP (CANCER CENTER ONLY)
ALT: 13 U/L (ref 0–44)
AST: 36 U/L (ref 15–41)
Albumin: 3.2 g/dL — ABNORMAL LOW (ref 3.5–5.0)
Alkaline Phosphatase: 70 U/L (ref 38–126)
Anion gap: 10 (ref 5–15)
BUN: 42 mg/dL — ABNORMAL HIGH (ref 8–23)
CO2: 24 mmol/L (ref 22–32)
Calcium: 8.1 mg/dL — ABNORMAL LOW (ref 8.9–10.3)
Chloride: 97 mmol/L — ABNORMAL LOW (ref 98–111)
Creatinine: 2.42 mg/dL — ABNORMAL HIGH (ref 0.44–1.00)
GFR, Estimated: 21 mL/min — ABNORMAL LOW (ref >60)
Glucose, Bld: 106 mg/dL — ABNORMAL HIGH (ref 70–99)
Potassium: 3.6 mmol/L (ref 3.5–5.1)
Sodium: 131 mmol/L — ABNORMAL LOW (ref 135–145)
Total Bilirubin: 0.7 mg/dL (ref 0.3–1.2)
Total Protein: 5.3 g/dL — ABNORMAL LOW (ref 6.5–8.1)

## 2021-01-03 LAB — CBC WITH DIFFERENTIAL (CANCER CENTER ONLY)
Abs Immature Granulocytes: 0 10*3/uL (ref 0.00–0.07)
Basophils Absolute: 0 10*3/uL (ref 0.0–0.1)
Basophils Relative: 1 %
Eosinophils Absolute: 0.1 10*3/uL (ref 0.0–0.5)
Eosinophils Relative: 4 %
HCT: 29.9 % — ABNORMAL LOW (ref 36.0–46.0)
Hemoglobin: 10.4 g/dL — ABNORMAL LOW (ref 12.0–15.0)
Immature Granulocytes: 0 %
Lymphocytes Relative: 41 %
Lymphs Abs: 0.9 10*3/uL (ref 0.7–4.0)
MCH: 29.5 pg (ref 26.0–34.0)
MCHC: 34.8 g/dL (ref 30.0–36.0)
MCV: 84.7 fL (ref 80.0–100.0)
Monocytes Absolute: 0.2 10*3/uL (ref 0.1–1.0)
Monocytes Relative: 10 %
Neutro Abs: 0.9 10*3/uL — ABNORMAL LOW (ref 1.7–7.7)
Neutrophils Relative %: 44 %
Platelet Count: 47 10*3/uL — ABNORMAL LOW (ref 150–400)
RBC: 3.53 MIL/uL — ABNORMAL LOW (ref 3.87–5.11)
RDW: 14.9 % (ref 11.5–15.5)
WBC Count: 2.1 10*3/uL — ABNORMAL LOW (ref 4.0–10.5)
nRBC: 0 % (ref 0.0–0.2)

## 2021-01-03 MED ORDER — HEPARIN SOD (PORK) LOCK FLUSH 100 UNIT/ML IV SOLN
500.0000 [IU] | Freq: Once | INTRAVENOUS | Status: AC | PRN
  Administered 2021-01-03: 500 [IU]

## 2021-01-03 MED ORDER — SODIUM CHLORIDE 0.9% FLUSH
10.0000 mL | INTRAVENOUS | Status: DC | PRN
  Administered 2021-01-03: 10 mL

## 2021-01-03 NOTE — Addendum Note (Signed)
Addended by: Tama Headings B on: 01/03/2021 10:25 AM   Modules accepted: Orders

## 2021-01-03 NOTE — Progress Notes (Signed)
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  PRIOR THERAPY: 1) Systemic therapy with Velcade 1.3 mg/M2 weekly, Revlimid 25 mg p.o. daily for 14 days every 3 weeks in addition to weekly Decadron 40 mg orally. First dose of treatment 10/08/2017.  Treatment was placed on hold due to development of a significant rash.  She resumed treatment with only dexamethasone and Velcade on 11/19/2017. This was discontinued on 04/14/2019 due to evidence of disease progression.  2) Pomalyst 4 mg p.o. for 21 days every 4 weeks and 40 mg p.o. Decadron once a week. First dose 04/29/2019-05/04/2019. Status post 6 days of treatment. Discontinued secondary to allergy to Pomalyst.  3) Chemotherapy with carfilzomib on days 1, 2, 8, 9, 15, and 16 and Cytoxan 300 mg/m2 on days 1, 8, and 15 IV every 4 weeks and 40 mg p.o. weekly of Decadron. Last dose expected on 03/02/2020. Status post 11 cycles. Discontinued due to intolerance  CURRENT THERAPY: Chemotherapy with Daratumumab on days 1, 8, 15, and 22 IV every 4 weeks and 20 mg p.o. weekly of Decadron. First dose on 05/17/2020.  Starting from cycle #3 she is on treatment every 2 weeks.  Status post 6 cycles.  INTERVAL HISTORY: Debbie Chan 70 y.o. female returns to the clinic today for follow-up visit.  I called her son and he was available by phone during the visit.  The patient continues to complain of the generalized fatigue and weakness as well as aching pain in the lower extremities and itching.  She has no current chest pain, shortness of breath, cough or hemoptysis.  She denied having any fever or chills.  She has no nausea, vomiting, diarrhea or constipation.  She has no headache or visual changes.  She has no bleeding issues.  She had repeat myeloma panel performed recently and she is here  for evaluation and discussion of her lab results and treatment options.  MEDICAL HISTORY: Past Medical History:  Diagnosis Date   Cancer ([place])     ALLERGIES:  has No Known Allergies.  MEDICATIONS:  Current Outpatient Medications  Medication Sig Dispense Refill   blood glucose meter kit and supplies Dispense based on patient and insurance preference. Use up to four times daily as directed. (FOR ICD-10 E10.9, E11.9). 1 each 0   carvedilol (COREG) 12.5 MG tablet TAKE 1 TABLET BY MOUTH 2 TIMES DAILY WITH A MEAL 60 tablet 3   dexamethasone (DECADRON) 4 MG tablet TAKE 5 TABLETS BY MOUTH ONCE A WEEK ON THE DAY OF TREATMENT 150 tablet 2   doxepin (SINEQUAN) 10 MG capsule Take 10 mg by mouth 2 (two) times daily as needed.     ferrous sulfate 325 (65 FE) MG tablet TAKE 1 TABLET BY MOUTH ONCE A DAY WITH BREAKFAST 30 tablet 3   furosemide (LASIX) 40 MG tablet TAKE 1 TABLET BY MOUTH 2 TIMES DAILY 60 tablet 3   hydrALAZINE (APRESOLINE) 100 MG tablet TAKE 1 TABLET BY MOUTH EVERY 8 HOURS 90 tablet 3   HYDROcodone-acetaminophen (NORCO) 5-325 MG tablet Take 1 tablet by mouth every 6 (six) hours as needed for moderate pain. 30 tablet 0   hydrOXYzine (ATARAX/VISTARIL) 10 MG tablet Take 1 tablet (10 mg total) by mouth at bedtime. 30 tablet 0   isosorbide mononitrate (IMDUR) 30 MG 24 hr tablet TAKE 1  TABLET BY MOUTH ONCE A DAY 90 tablet 3   lidocaine (LIDODERM) 5 % 1 patch daily as needed.     lidocaine-prilocaine (EMLA) cream Apply topically as needed 30 g 1   loratadine (CLARITIN) 10 MG tablet Take 10 mg by mouth daily.     MELATONIN PO Take 10 mg by mouth at bedtime as needed (sleep).     omeprazole (PRILOSEC) 20 MG capsule TAKE 1 CAPSULE BY MOUTH ONCE DAILY 30 capsule 4   Probiotic Product (RESTORA PO) Take 1 tablet by mouth daily. Over the counter Restora 138m -400 mg-4 Take 1 tablet by mouth daily for stomach gas.     ReliOn Ultra Thin Lancets 30G MISC USE 1 TO CHECK GLUCOSE ONCE DAILY FOR GLUCOSE  TESTING     No current facility-administered medications for this visit.   Facility-Administered Medications Ordered in Other Visits  Medication Dose Route Frequency Provider Last Rate Last Admin   sodium chloride flush (NS) 0.9 % injection 10 mL  10 mL Intracatheter PRN Debbie Bears MD   10 mL at 02/22/20 1127   sodium chloride flush (NS) 0.9 % injection 10 mL  10 mL Intracatheter PRN Debbie Bears MD   10 mL at 07/19/20 1630    SURGICAL HISTORY:  Past Surgical History:  Procedure Laterality Date   IR IMAGING GUIDED PORT INSERTION  05/10/2019    REVIEW OF SYSTEMS:  Constitutional: positive for fatigue Eyes: negative Ears, nose, mouth, throat, and face: negative Respiratory: negative Cardiovascular: negative Gastrointestinal: negative Genitourinary:negative Integument/breast: negative Hematologic/lymphatic: negative Musculoskeletal:positive for arthralgias, bone pain, and muscle weakness Neurological: negative Behavioral/Psych: negative Endocrine: negative Allergic/Immunologic: negative   PHYSICAL EXAMINATION: General appearance: alert, cooperative, fatigued, and no distress Head: Normocephalic, without obvious abnormality, atraumatic Neck: no adenopathy, no JVD, supple, symmetrical, trachea midline, and thyroid not enlarged, symmetric, no tenderness/mass/nodules Lymph nodes: Cervical, supraclavicular, and axillary nodes normal. Resp: clear to auscultation bilaterally Back: symmetric, no curvature. ROM normal. No CVA tenderness. Cardio: regular rate and rhythm, S1, S2 normal, no murmur, click, rub or gallop GI: soft, non-tender; bowel sounds normal; no masses,  no organomegaly Extremities: extremities normal, atraumatic, no cyanosis or edema Neurologic: Alert and oriented X 3, normal strength and tone. Normal symmetric reflexes. Normal coordination and gait  ECOG PERFORMANCE STATUS: 1 - Symptomatic but completely ambulatory  Blood pressure 101/63, pulse 62,  temperature 98.9 F (37.2 C), temperature source Tympanic, resp. rate 19, height '5\' 8"'  (1.727 m), weight 177 lb 8 oz (80.5 kg), SpO2 100 %.  LABORATORY DATA: Lab Results  Component Value Date   WBC 2.1 (L) 01/03/2021   HGB 10.4 (L) 01/03/2021   HCT 29.9 (L) 01/03/2021   MCV 84.7 01/03/2021   PLT 47 (L) 01/03/2021      Chemistry      Component Value Date/Time   NA 136 12/27/2020 0914   NA 132 (L) 04/17/2020 1612   K 3.7 12/27/2020 0914   CL 102 12/27/2020 0914   CO2 21 (L) 12/27/2020 0914   BUN 44 (H) 12/27/2020 0914   BUN 46 (H) 04/17/2020 1612   CREATININE 2.76 (H) 12/27/2020 0914      Component Value Date/Time   CALCIUM 8.7 (L) 12/27/2020 0914   ALKPHOS 94 12/27/2020 0914   AST 29 12/27/2020 0914   ALT 15 12/27/2020 0914   BILITOT 0.8 12/27/2020 0914       RADIOGRAPHIC STUDIES: No results found.  ASSESSMENT AND PLAN: This is a very pleasant 70years old African female originally  from [place] who is visiting her son in [place] and was recently diagnosed with IgA multiple myeloma. The patient was a started initially on treatment with weekly subcutaneous Velcade 1.3 mg/M2, Revlimid 25 mg p.o. daily for 21 days every 4 weeks as well as weekly Decadron 40 mg orally.  Revlimid was discontinued secondary to hypersensitivity reaction with significant skin rash.  She is currently on treatment with weekly Velcade and Decadron. Her treatment was discontinued secondary to disease progression.  The patient started treatment with Pomalyst and Decadron but this was discontinued secondary to hypersensitivity reaction to the Pomalyst. She underwent systemic chemotherapy with carfilzomib, Cytoxan and Decadron status post 11 cycles. The patient tolerated the previous treatment well but this was discontinued secondary to disease progression as well as recent hospitalization with COVID-19 infection and pneumonia.  She is recovering well but she has not received any treatment  for the last 6 weeks or more. She started treatment with daratumumab and Decadron status post 8 cycles.  The patient has been tolerating this treatment well except for fatigue and also thrombocytopenia. She had repeat myeloma panel performed recently.  I discussed the lab results with the patient and her son today.  Unfortunately her protein study showed significant increase in the free lambda light chain indicative of disease progression. I recommended for the patient to discontinue her current treatment with daratumumab and Decadron at this point.  I will refer her to a tertiary center for discussion of other treatment options if available.  Other future option could be belantamab but I am concerned about the significant toxicity with this drug and the patient tolerability. I also discussed with the patient and her son consideration of palliative care and moving The patient back to [place] to be close to her other family member before something worse happens. I will see her for evaluation after the second opinion at [facility]. She was advised to call immediately if she has any other concerning symptoms in the interval.  The patient voices understanding of current disease status and treatment options and is in agreement with the current care plan.  All questions were answered. The patient knows to call the clinic with any problems, questions or concerns. We can certainly see the patient much sooner if necessary.  The total time spent in the appointment was 35 minutes.  Disclaimer: This note was dictated with voice recognition software. Similar sounding words can inadvertently be transcribed and may not be corrected upon review.

## 2021-01-07 ENCOUNTER — Encounter: Payer: Self-pay | Admitting: Internal Medicine

## 2021-01-07 ENCOUNTER — Other Ambulatory Visit: Payer: Self-pay | Admitting: Internal Medicine

## 2021-01-07 ENCOUNTER — Other Ambulatory Visit (HOSPITAL_COMMUNITY): Payer: Self-pay

## 2021-01-07 DIAGNOSIS — C9000 Multiple myeloma not having achieved remission: Secondary | ICD-10-CM

## 2021-01-07 MED ORDER — HYDROCODONE-ACETAMINOPHEN 5-325 MG PO TABS
1.0000 | ORAL_TABLET | Freq: Four times a day (QID) | ORAL | 0 refills | Status: DC | PRN
  Filled 2021-01-07: qty 30, 8d supply, fill #0

## 2021-01-08 NOTE — Progress Notes (Signed)
..  Patient Assist/Replace for the following has been terminated. Medication: Darzalex (daratumumab) Reason for Termination: Provider discontinued treatment on 01/03/2021 due to disease progression. Last DOS: 12/06/2020.  Marland KitchenJuan Quam, CPhT IV Drug Replacement Specialist [place] Phone: [phone]

## 2021-01-10 ENCOUNTER — Telehealth: Payer: Self-pay | Admitting: Medical Oncology

## 2021-01-10 NOTE — Telephone Encounter (Signed)
Pt referred to [facility] for her Multiple Myeloma. Records faxed

## 2021-01-18 ENCOUNTER — Other Ambulatory Visit: Payer: Self-pay | Admitting: Internal Medicine

## 2021-01-18 ENCOUNTER — Encounter: Payer: Self-pay | Admitting: Internal Medicine

## 2021-01-18 ENCOUNTER — Other Ambulatory Visit (HOSPITAL_COMMUNITY): Payer: Self-pay

## 2021-01-18 DIAGNOSIS — K219 Gastro-esophageal reflux disease without esophagitis: Secondary | ICD-10-CM

## 2021-01-18 DIAGNOSIS — C9000 Multiple myeloma not having achieved remission: Secondary | ICD-10-CM

## 2021-01-18 MED ORDER — HYDROCODONE-ACETAMINOPHEN 5-325 MG PO TABS
1.0000 | ORAL_TABLET | Freq: Four times a day (QID) | ORAL | 0 refills | Status: AC | PRN
  Filled 2021-01-18: qty 30, 8d supply, fill #0

## 2021-01-18 MED ORDER — OMEPRAZOLE 20 MG PO CPDR
DELAYED_RELEASE_CAPSULE | Freq: Every day | ORAL | 4 refills | Status: AC
  Filled 2021-01-18: qty 30, 30d supply, fill #0
  Filled 2021-02-20: qty 30, 30d supply, fill #1
  Filled 2021-03-20: qty 30, 30d supply, fill #2

## 2021-01-22 ENCOUNTER — Other Ambulatory Visit (HOSPITAL_COMMUNITY): Payer: Self-pay

## 2021-01-23 ENCOUNTER — Telehealth: Payer: Self-pay | Admitting: Medical Oncology

## 2021-01-23 NOTE — Telephone Encounter (Signed)
[facility]-update -[place] was  diagnosed with  thoracic plasmacytoma and T9 compression fx.   neurosurgery said that cannot do surgery . Plan -She was referred to XRT at [facility] . Pt on Dex 4 mg q 6 hrs x 14 days Tylenol 1 gm q 8 hr Dilaudid 2 mg q 4hr prn  Dr Julien Nordmann to advise re F/U appt .

## 2021-01-25 ENCOUNTER — Telehealth: Payer: Self-pay

## 2021-01-25 NOTE — Telephone Encounter (Signed)
Dr. Malva Limes left a message requesting for Dr. Julien Nordmann to call her on her cell to discuss this pts status.

## 2021-01-28 ENCOUNTER — Other Ambulatory Visit (HOSPITAL_COMMUNITY): Payer: Self-pay

## 2021-01-31 ENCOUNTER — Ambulatory Visit: Payer: Self-pay | Admitting: Internal Medicine

## 2021-01-31 ENCOUNTER — Other Ambulatory Visit: Payer: Self-pay

## 2021-01-31 ENCOUNTER — Ambulatory Visit: Payer: Self-pay

## 2021-02-01 ENCOUNTER — Encounter: Payer: Self-pay | Admitting: Internal Medicine

## 2021-02-01 ENCOUNTER — Other Ambulatory Visit (HOSPITAL_COMMUNITY): Payer: Self-pay

## 2021-02-12 ENCOUNTER — Other Ambulatory Visit (HOSPITAL_COMMUNITY): Payer: Self-pay

## 2021-02-12 ENCOUNTER — Encounter: Payer: Self-pay | Admitting: Internal Medicine

## 2021-02-14 ENCOUNTER — Other Ambulatory Visit (HOSPITAL_COMMUNITY): Payer: Self-pay

## 2021-02-20 ENCOUNTER — Encounter: Payer: Self-pay | Admitting: Internal Medicine

## 2021-02-20 ENCOUNTER — Other Ambulatory Visit (HOSPITAL_COMMUNITY): Payer: Self-pay

## 2021-02-25 ENCOUNTER — Other Ambulatory Visit (HOSPITAL_COMMUNITY): Payer: Self-pay

## 2021-02-27 ENCOUNTER — Other Ambulatory Visit (HOSPITAL_COMMUNITY): Payer: Self-pay

## 2021-02-28 ENCOUNTER — Encounter: Payer: Self-pay | Admitting: Internal Medicine

## 2021-02-28 ENCOUNTER — Other Ambulatory Visit (HOSPITAL_COMMUNITY): Payer: Self-pay

## 2021-03-11 ENCOUNTER — Other Ambulatory Visit: Payer: Self-pay | Admitting: Internal Medicine

## 2021-03-11 ENCOUNTER — Other Ambulatory Visit (HOSPITAL_COMMUNITY): Payer: Self-pay

## 2021-03-13 ENCOUNTER — Other Ambulatory Visit: Payer: Self-pay

## 2021-03-13 ENCOUNTER — Other Ambulatory Visit (HOSPITAL_COMMUNITY): Payer: Self-pay

## 2021-03-14 ENCOUNTER — Other Ambulatory Visit: Payer: Self-pay | Admitting: *Deleted

## 2021-03-14 ENCOUNTER — Other Ambulatory Visit (HOSPITAL_COMMUNITY): Payer: Self-pay

## 2021-03-14 ENCOUNTER — Encounter: Payer: Self-pay | Admitting: Internal Medicine

## 2021-03-14 MED ORDER — FUROSEMIDE 40 MG PO TABS
ORAL_TABLET | Freq: Two times a day (BID) | ORAL | 3 refills | Status: AC
  Filled 2021-03-14 – 2021-03-30 (×2): qty 60, 30d supply, fill #0

## 2021-03-20 ENCOUNTER — Encounter: Payer: Self-pay | Admitting: Internal Medicine

## 2021-03-20 ENCOUNTER — Other Ambulatory Visit (HOSPITAL_COMMUNITY): Payer: Self-pay

## 2021-03-20 ENCOUNTER — Other Ambulatory Visit: Payer: Self-pay | Admitting: Internal Medicine

## 2021-03-20 DIAGNOSIS — C9000 Multiple myeloma not having achieved remission: Secondary | ICD-10-CM

## 2021-03-21 ENCOUNTER — Other Ambulatory Visit (HOSPITAL_COMMUNITY): Payer: Self-pay

## 2021-03-22 ENCOUNTER — Other Ambulatory Visit (HOSPITAL_COMMUNITY): Payer: Self-pay

## 2021-03-30 ENCOUNTER — Other Ambulatory Visit (HOSPITAL_COMMUNITY): Payer: Self-pay | Admitting: General Practice

## 2021-03-30 ENCOUNTER — Encounter: Payer: Self-pay | Admitting: Internal Medicine

## 2021-03-30 ENCOUNTER — Other Ambulatory Visit (HOSPITAL_COMMUNITY): Payer: Self-pay

## 2021-04-01 ENCOUNTER — Other Ambulatory Visit (HOSPITAL_COMMUNITY): Payer: Self-pay

## 2021-04-01 ENCOUNTER — Encounter: Payer: Self-pay | Admitting: Internal Medicine

## 2021-04-01 MED ORDER — HYDRALAZINE HCL 100 MG PO TABS
ORAL_TABLET | ORAL | 5 refills | Status: AC
  Filled 2021-04-01: qty 90, 30d supply, fill #0

## 2021-04-01 MED ORDER — CARVEDILOL 12.5 MG PO TABS
ORAL_TABLET | Freq: Two times a day (BID) | ORAL | 5 refills | Status: AC
  Filled 2021-04-01: qty 60, 30d supply, fill #0

## 2021-04-02 ENCOUNTER — Other Ambulatory Visit (HOSPITAL_COMMUNITY): Payer: Self-pay

## 2021-04-27 ENCOUNTER — Other Ambulatory Visit (HOSPITAL_COMMUNITY): Payer: Self-pay

## 2021-05-27 ENCOUNTER — Telehealth: Payer: Self-pay | Admitting: Internal Medicine

## 2021-05-27 NOTE — Telephone Encounter (Signed)
.  Called patient to schedule appointment per 3/29 inbasket, patient is aware of date and time.   ?

## 2021-06-10 ENCOUNTER — Telehealth: Payer: Self-pay | Admitting: Medical Oncology

## 2021-06-10 ENCOUNTER — Other Ambulatory Visit: Payer: Self-pay | Admitting: Medical Oncology

## 2021-06-10 ENCOUNTER — Inpatient Hospital Stay: Payer: Self-pay

## 2021-06-10 ENCOUNTER — Inpatient Hospital Stay: Payer: Self-pay | Attending: Internal Medicine | Admitting: Internal Medicine

## 2021-06-10 ENCOUNTER — Encounter: Payer: Self-pay | Admitting: Internal Medicine

## 2021-06-10 ENCOUNTER — Other Ambulatory Visit (HOSPITAL_COMMUNITY): Payer: Self-pay

## 2021-06-10 ENCOUNTER — Other Ambulatory Visit: Payer: Self-pay

## 2021-06-10 VITALS — BP 133/63 | HR 78 | Temp 96.3°F | Resp 18 | Ht 68.0 in | Wt 184.1 lb

## 2021-06-10 DIAGNOSIS — Z95828 Presence of other vascular implants and grafts: Secondary | ICD-10-CM

## 2021-06-10 DIAGNOSIS — C9000 Multiple myeloma not having achieved remission: Secondary | ICD-10-CM

## 2021-06-10 DIAGNOSIS — Z79899 Other long term (current) drug therapy: Secondary | ICD-10-CM | POA: Insufficient documentation

## 2021-06-10 DIAGNOSIS — Z23 Encounter for immunization: Secondary | ICD-10-CM | POA: Insufficient documentation

## 2021-06-10 DIAGNOSIS — Z8616 Personal history of COVID-19: Secondary | ICD-10-CM | POA: Insufficient documentation

## 2021-06-10 DIAGNOSIS — D649 Anemia, unspecified: Secondary | ICD-10-CM | POA: Insufficient documentation

## 2021-06-10 DIAGNOSIS — Z5111 Encounter for antineoplastic chemotherapy: Secondary | ICD-10-CM

## 2021-06-10 LAB — CMP (CANCER CENTER ONLY)
ALT: 10 U/L (ref 0–44)
AST: 15 U/L (ref 15–41)
Albumin: 3.4 g/dL — ABNORMAL LOW (ref 3.5–5.0)
Alkaline Phosphatase: 158 U/L — ABNORMAL HIGH (ref 38–126)
Anion gap: 9 (ref 5–15)
BUN: 46 mg/dL — ABNORMAL HIGH (ref 8–23)
CO2: 26 mmol/L (ref 22–32)
Calcium: 11.2 mg/dL — ABNORMAL HIGH (ref 8.9–10.3)
Chloride: 102 mmol/L (ref 98–111)
Creatinine: 2.91 mg/dL — ABNORMAL HIGH (ref 0.44–1.00)
GFR, Estimated: 17 mL/min — ABNORMAL LOW (ref >60)
Glucose, Bld: 114 mg/dL — ABNORMAL HIGH (ref 70–99)
Potassium: 3.7 mmol/L (ref 3.5–5.1)
Sodium: 137 mmol/L (ref 135–145)
Total Bilirubin: 0.5 mg/dL (ref 0.3–1.2)
Total Protein: 6.7 g/dL (ref 6.5–8.1)

## 2021-06-10 LAB — CBC WITH DIFFERENTIAL (CANCER CENTER ONLY)
Abs Immature Granulocytes: 0.02 10*3/uL (ref 0.00–0.07)
Basophils Absolute: 0 10*3/uL (ref 0.0–0.1)
Basophils Relative: 0 %
Eosinophils Absolute: 0.1 10*3/uL (ref 0.0–0.5)
Eosinophils Relative: 3 %
HCT: 22.8 % — ABNORMAL LOW (ref 36.0–46.0)
Hemoglobin: 7.9 g/dL — ABNORMAL LOW (ref 12.0–15.0)
Immature Granulocytes: 0 %
Lymphocytes Relative: 12 %
Lymphs Abs: 0.6 10*3/uL — ABNORMAL LOW (ref 0.7–4.0)
MCH: 31.5 pg (ref 26.0–34.0)
MCHC: 34.6 g/dL (ref 30.0–36.0)
MCV: 90.8 fL (ref 80.0–100.0)
Monocytes Absolute: 0.5 10*3/uL (ref 0.1–1.0)
Monocytes Relative: 11 %
Neutro Abs: 3.4 10*3/uL (ref 1.7–7.7)
Neutrophils Relative %: 74 %
Platelet Count: 62 10*3/uL — ABNORMAL LOW (ref 150–400)
RBC: 2.51 MIL/uL — ABNORMAL LOW (ref 3.87–5.11)
RDW: 16.2 % — ABNORMAL HIGH (ref 11.5–15.5)
WBC Count: 4.6 10*3/uL (ref 4.0–10.5)
nRBC: 0 % (ref 0.0–0.2)

## 2021-06-10 MED ORDER — ACETAMINOPHEN 325 MG PO TABS: 650.0000 mg | ORAL_TABLET | Freq: Once | ORAL | Status: DC

## 2021-06-10 MED ORDER — DIPHENHYDRAMINE HCL 25 MG PO CAPS
25.0000 mg | ORAL_CAPSULE | Freq: Once | ORAL | Status: AC
  Administered 2021-06-10: 25 mg via ORAL
  Filled 2021-06-10: qty 1

## 2021-06-10 MED ORDER — SODIUM CHLORIDE 0.9% IV SOLUTION
250.0000 mL | Freq: Once | INTRAVENOUS | Status: AC
  Administered 2021-06-10: 250 mL via INTRAVENOUS

## 2021-06-10 MED ORDER — PROCHLORPERAZINE MALEATE 10 MG PO TABS
10.0000 mg | ORAL_TABLET | Freq: Four times a day (QID) | ORAL | 0 refills | Status: AC | PRN
  Filled 2021-06-10: qty 30, 8d supply, fill #0

## 2021-06-10 MED ORDER — DEXAMETHASONE 4 MG PO TABS
ORAL_TABLET | ORAL | 2 refills | Status: AC
  Filled 2021-06-10: qty 150, 90d supply, fill #0

## 2021-06-10 MED ORDER — SODIUM CHLORIDE 0.9% FLUSH
10.0000 mL | INTRAVENOUS | Status: DC | PRN
  Administered 2021-06-10: 10 mL

## 2021-06-10 MED ORDER — ACETAMINOPHEN 325 MG PO TABS
650.0000 mg | ORAL_TABLET | Freq: Once | ORAL | Status: AC
  Administered 2021-06-10: 650 mg via ORAL
  Filled 2021-06-10: qty 2

## 2021-06-10 MED ORDER — DIPHENHYDRAMINE HCL 25 MG PO CAPS: 25.0000 mg | ORAL_CAPSULE | Freq: Once | ORAL | Status: DC

## 2021-06-10 MED ORDER — ACYCLOVIR 400 MG PO TABS
400.0000 mg | ORAL_TABLET | Freq: Two times a day (BID) | ORAL | 3 refills | Status: AC
  Filled 2021-06-10: qty 60, 30d supply, fill #0

## 2021-06-10 MED ORDER — HEPARIN SOD (PORK) LOCK FLUSH 100 UNIT/ML IV SOLN
500.0000 [IU] | Freq: Once | INTRAVENOUS | Status: AC | PRN
  Administered 2021-06-10: 500 [IU]

## 2021-06-10 NOTE — Progress Notes (Signed)
Pt getting 1 unit of blood today. ?

## 2021-06-10 NOTE — Progress Notes (Signed)
Type and screen and prepare released. 

## 2021-06-10 NOTE — Progress Notes (Signed)
?    [facility] ?Telephone:(336) [phone]   [phone] ? ?OFFICE PROGRESS NOTE ? ?Debbie Ebbs, MD ?[address] ?[place] [address] ? 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 ? ?PRIOR THERAPY: ?1) Systemic therapy with Velcade 1.3 mg/M2 weekly, Revlimid 25 mg p.o. daily for 14 days every 3 weeks in addition to weekly Decadron 40 mg orally. First dose of treatment 10/08/2017.  Treatment was placed on hold due to development of a significant rash.  She resumed treatment with only dexamethasone and Velcade on 11/19/2017. This was discontinued on 04/14/2019 due to evidence of disease progression.  ?2) Pomalyst 4 mg p.o. for 21 days every 4 weeks and 40 mg p.o. Decadron once a week. First dose 04/29/2019-05/04/2019. Status post 6 days of treatment. Discontinued secondary to allergy to Pomalyst.  ?3) Chemotherapy with carfilzomib on days 1, 2, 8, 9, 15, and 16 and Cytoxan 300 mg/m2 on days 1, 8, and 15 IV every 4 weeks and 40 mg p.o. weekly of Decadron. Last dose expected on 03/02/2020. Status post 11 cycles. Discontinued due to intolerance ?4) Chemotherapy with Daratumumab on days 1, 8, 15, and 22 IV every 4 weeks and 20 mg p.o. weekly of Decadron. First dose on 05/17/2020.  Starting from cycle #3 she is on treatment every 2 weeks.  Status post 8 cycles.  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 ? ?CURRENT THERAPY: Systemic chemotherapy with elotuzumab 10 mg/KG weekly every 4 weeks for 2 cycles followed by 20 Mg/KG every 4 weeks starting from cycle #2.  The patient will also be on Pomalyst 1 mg p.o. daily for 21 days every 4 weeks as well as Decadron initially 28 mg p.o. weekly for the first 2 cycles and then starting cycle #3 she will be on 40 mg p.o. weekly.  First dose expected on September 18, 2021 ? ?INTERVAL HISTORY: ?Debbie Chan 71 y.o. female returns to the clinic today for follow-up visit.  Her son was available by phone during the visit.  The patient continues to complain of weakness in the lower extremities especially on the left side.  She was last seen on January 03, 2021.  At that time we recommended for the patient palliative care and hospice but she seek the second opinion at [facility].  She was seen few times by Dr. Leilani Chan.  During that time she was also found to have destructive lesion of the T9 vertebral body and the patient underwent palliative radiotherapy.  She was lost to follow-up few times with him.  The last time he saw her he recommended treatment with elotuzumab in addition to reduced dose Pomalyst and Decadron.  He has been trying to get this medication approved at [facility] but the patient has lack of insurance and he has some difficulty obtaining the medication.  He called me and asked if I can help starting her on this regiment at [facility] health.  The patient came back for evaluation and discussion of this option before starting her treatment.  She has some concern about the toxicity of this regimen that was discussed with her by Dr. Rosemarie Chan.  She denied having any current nausea, vomiting, diarrhea or constipation.  She has no headache or visual changes.  She has no fever or chills.  She has no chest pain, shortness of breath, cough or hemoptysis. ? ?  MEDICAL HISTORY: ?Past Medical History:  ?Diagnosis Date  ? Cancer [facility])   ? ? ?ALLERGIES:  has No Known Allergies. ? ?MEDICATIONS:  ?Current Outpatient Medications  ?Medication Sig Dispense Refill  ? blood glucose meter kit and supplies Dispense based on patient and insurance preference. Use up to four times daily as directed. (FOR ICD-10 E10.9, E11.9). 1 each 0  ? carvedilol (COREG) 12.5 MG tablet TAKE 1 TABLET BY MOUTH 2 TIMES DAILY WITH A MEAL 60 tablet 5  ? dexamethasone (DECADRON) 4 MG tablet TAKE 5 TABLETS BY MOUTH ONCE A WEEK  ON THE DAY OF TREATMENT 150 tablet 2  ? doxepin (SINEQUAN) 10 MG capsule Take 10 mg by mouth 2 (two) times daily as needed.    ? ferrous sulfate 325 (65 FE) MG tablet TAKE 1 TABLET BY MOUTH ONCE A DAY WITH BREAKFAST 30 tablet 3  ? furosemide (LASIX) 40 MG tablet TAKE 1 TABLET BY MOUTH 2 TIMES DAILY 60 tablet 3  ? hydrALAZINE (APRESOLINE) 100 MG tablet TAKE 1 TABLET BY MOUTH EVERY 8 HOURS 90 tablet 3  ? hydrALAZINE (APRESOLINE) 100 MG tablet Take 1 tablet by mouth 3 times daily 90 tablet 5  ? HYDROcodone-acetaminophen (NORCO) 5-325 MG tablet Take 1 tablet by mouth every 6 hours as needed for moderate pain. 30 tablet 0  ? hydrOXYzine (ATARAX/VISTARIL) 10 MG tablet Take 1 tablet (10 mg total) by mouth at bedtime. 30 tablet 0  ? isosorbide mononitrate (IMDUR) 30 MG 24 hr tablet TAKE 1 TABLET BY MOUTH ONCE A DAY 90 tablet 3  ? lidocaine (LIDODERM) 5 % 1 patch daily as needed.    ? lidocaine-prilocaine (EMLA) cream Apply topically as needed 30 g 1  ? loratadine (CLARITIN) 10 MG tablet Take 10 mg by mouth daily.    ? MELATONIN PO Take 10 mg by mouth at bedtime as needed (sleep).    ? omeprazole (PRILOSEC) 20 MG capsule TAKE 1 CAPSULE BY MOUTH ONCE DAILY 30 capsule 4  ? Probiotic Product (RESTORA PO) Take 1 tablet by mouth daily. Over the counter Restora 110m -400 mg-4 Take 1 tablet by mouth daily for stomach gas.    ? ReliOn Ultra Thin Lancets 30G MISC USE 1 TO CHECK GLUCOSE ONCE DAILY FOR GLUCOSE TESTING    ? ?No current facility-administered medications for this visit.  ? ?Facility-Administered Medications Ordered in Other Visits  ?Medication Dose Route Frequency Provider Last Rate Last Admin  ? sodium chloride flush (NS) 0.9 % injection 10 mL  10 mL Intracatheter PRN Debbie Bears MD   10 mL at 02/22/20 1127  ? sodium chloride flush (NS) 0.9 % injection 10 mL  10 mL Intracatheter PRN Debbie Bears MD   10 mL at 07/19/20 1630  ? sodium chloride flush (NS) 0.9 % injection 10 mL  10 mL Intracatheter PRN Debbie Bears MD   10 mL at 06/10/21 [id number] ? ? ?SURGICAL HISTORY:  ?Past Surgical History:  ?Procedure Laterality Date  ? IR IMAGING GUIDED PORT INSERTION  05/10/2019  ? ? ?REVIEW OF SYSTEMS:  Constitutional: positive for fatigue ?Eyes: negative ?Ears, nose, mouth, throat, and face: negative ?Respiratory: negative ?Cardiovascular: negative ?Gastrointestinal: negative ?Genitourinary:negative ?Integument/breast: negative ?Hematologic/lymphatic: negative ?Musculoskeletal:positive for bone pain and muscle weakness ?Neurological: negative ?Behavioral/Psych: negative ?Endocrine: negative ?Allergic/Immunologic: negative  ? ?PHYSICAL EXAMINATION: General appearance: alert, cooperative, fatigued, and no distress ?Head: Normocephalic, without obvious abnormality, atraumatic ?Neck: no adenopathy, no JVD, supple, symmetrical, trachea midline, and thyroid not enlarged, symmetric, no tenderness/mass/nodules ?Lymph  nodes: Cervical, supraclavicular, and axillary nodes normal. ?Resp: clear to auscultation bilaterally ?Back: symmetric, no curvature. ROM normal. No CVA tenderness. ?Cardio: regular rate and rhythm, S1, S2 normal, no murmur, click, rub or gallop ?GI: soft, non-tender; bowel sounds normal; no masses,  no organomegaly ?Extremities: extremities normal, atraumatic, no cyanosis or edema ?Neurologic: Alert and oriented X 3, normal strength and tone. Normal symmetric reflexes. Normal coordination and gait ? ?ECOG PERFORMANCE STATUS: 1 - Symptomatic but completely ambulatory ? ?Blood pressure 133/63, pulse 78, temperature (!) 96.3 ?F (35.7 ?C), temperature source Tympanic, resp. rate 18, height '5\' 8"'  (1.727 m), weight 184 lb 1.6 oz (83.5 kg), SpO2 100 %. ? ?LABORATORY DATA: ?Lab Results  ?Component Value Date  ? WBC 2.1 (L) 01/03/2021  ? HGB 10.4 (L) 01/03/2021  ? HCT 29.9 (L) 01/03/2021  ? MCV 84.7 01/03/2021  ? PLT 47 (L) 01/03/2021  ? ? ?  Chemistry   ?   ?Component Value Date/Time  ? NA 131 (L) 01/03/2021 0912  ? NA 132 (L)  04/17/2020 1612  ? K 3.6 01/03/2021 0912  ? CL 97 (L) 01/03/2021 0912  ? CO2 24 01/03/2021 0912  ? BUN 42 (H) 01/03/2021 0912  ? BUN 46 (H) 04/17/2020 1612  ? CREATININE 2.42 (H) 01/03/2021 0912  ?    ?Co

## 2021-06-10 NOTE — Telephone Encounter (Signed)
Blood transfusion orders entered for transfusion today . Son aware. ?

## 2021-06-10 NOTE — Telephone Encounter (Signed)
Pomalyst-I explained to [place] ( [place]) that pt saw Julien Nordmann today and he ordered Pomalyst .  ? ?Threasa Beards said that [place] provider at [facility] ordered it too .  ? ?However, they are waiting on an auth number.  ? ?She will call me back and let me know if Julien Nordmann needs to order it.  ? ?

## 2021-06-10 NOTE — Progress Notes (Signed)
DISCONTINUE ON PATHWAY REGIMEN - Multiple Myeloma and Other Plasma Cell Dyscrasias ? ? ?  Cycles 1 and 2: A cycle is every 28 days: ?    Daratumumab  ?  Cycles 3 through 6: A cycle is every 28 days: ?    Daratumumab  ?  Cycles 7 and beyond: A cycle is every 28 days: ?    Daratumumab  ? ?**Always confirm dose/schedule in your pharmacy ordering system** ? ?REASON: Disease Progression ?PRIOR TREATMENT: [id number]: Daratumumab 16 mg/kg IV q28 Days Until Progression or Unacceptable Toxicity ?TREATMENT RESPONSE: Stable Disease (SD) ? ?START OFF PATHWAY REGIMEN - Multiple Myeloma and Other Plasma Cell Dyscrasias ? ? ?[id number]:EPd (Elotuzumab IV + Pomalidomide PO + Dexamethasone IV/PO) q28 Days: ?  Cycles 1 and 2: A cycle is every 28 days: ?    Pomalidomide  ?    Dexamethasone  ?    Dexamethasone  ?    Elotuzumab  ?  Cycles 3 and beyond: A cycle is every 28 days: ?    Pomalidomide  ?    Dexamethasone  ?    Dexamethasone  ?    Elotuzumab  ?    Dexamethasone  ? ?**Always confirm dose/schedule in your pharmacy ordering system** ? ?Patient Characteristics: ?Multiple Myeloma, Relapsed / Refractory, Second through Fourth Lines of Therapy, Fit or Candidate for Triplet Therapy, Not Lenalidomide-Refractory and Lenalidomide-based Regimen Preferred, Candidate for Anti-CD38 Antibody ?Disease Classification: Multiple Myeloma ?R-ISS Staging: III ?Therapeutic Status: Relapsed ?Line of Therapy: Fourth Line ?Anti-CD38 Antibody Candidacy: Candidate for Anti-CD38 Antibody ?Lenalidomide-based Regimen Preference/Candidacy: Not Lenalidomide-Refractory and Lenalidomide-based Regimen Preferred ?Intent of Therapy: ?Non-Curative / Palliative Intent, Discussed with Patient ?

## 2021-06-11 LAB — PREPARE RBC (CROSSMATCH)

## 2021-06-12 ENCOUNTER — Other Ambulatory Visit: Payer: Self-pay | Admitting: Internal Medicine

## 2021-06-12 ENCOUNTER — Other Ambulatory Visit: Payer: Self-pay

## 2021-06-12 ENCOUNTER — Other Ambulatory Visit: Payer: Self-pay | Admitting: Physician Assistant

## 2021-06-12 ENCOUNTER — Telehealth: Payer: Self-pay

## 2021-06-12 ENCOUNTER — Inpatient Hospital Stay: Payer: Self-pay

## 2021-06-12 ENCOUNTER — Other Ambulatory Visit: Payer: Self-pay | Admitting: *Deleted

## 2021-06-12 ENCOUNTER — Encounter: Payer: Self-pay | Admitting: Internal Medicine

## 2021-06-12 DIAGNOSIS — C9000 Multiple myeloma not having achieved remission: Secondary | ICD-10-CM

## 2021-06-12 DIAGNOSIS — D649 Anemia, unspecified: Secondary | ICD-10-CM

## 2021-06-12 DIAGNOSIS — Z95828 Presence of other vascular implants and grafts: Secondary | ICD-10-CM

## 2021-06-12 DIAGNOSIS — D508 Other iron deficiency anemias: Secondary | ICD-10-CM

## 2021-06-12 LAB — CBC WITH DIFFERENTIAL (CANCER CENTER ONLY)
Abs Immature Granulocytes: 0.01 10*3/uL (ref 0.00–0.07)
Basophils Absolute: 0 10*3/uL (ref 0.0–0.1)
Basophils Relative: 0 %
Eosinophils Absolute: 0.1 10*3/uL (ref 0.0–0.5)
Eosinophils Relative: 3 %
HCT: 25.3 % — ABNORMAL LOW (ref 36.0–46.0)
Hemoglobin: 8.6 g/dL — ABNORMAL LOW (ref 12.0–15.0)
Immature Granulocytes: 0 %
Lymphocytes Relative: 15 %
Lymphs Abs: 0.7 10*3/uL (ref 0.7–4.0)
MCH: 31 pg (ref 26.0–34.0)
MCHC: 34 g/dL (ref 30.0–36.0)
MCV: 91.3 fL (ref 80.0–100.0)
Monocytes Absolute: 0.6 10*3/uL (ref 0.1–1.0)
Monocytes Relative: 13 %
Neutro Abs: 3.4 10*3/uL (ref 1.7–7.7)
Neutrophils Relative %: 69 %
Platelet Count: 75 10*3/uL — ABNORMAL LOW (ref 150–400)
RBC: 2.77 MIL/uL — ABNORMAL LOW (ref 3.87–5.11)
RDW: 16.3 % — ABNORMAL HIGH (ref 11.5–15.5)
WBC Count: 4.9 10*3/uL (ref 4.0–10.5)
nRBC: 0 % (ref 0.0–0.2)

## 2021-06-12 LAB — CMP (CANCER CENTER ONLY)
ALT: 9 U/L (ref 0–44)
AST: 15 U/L (ref 15–41)
Albumin: 3.5 g/dL (ref 3.5–5.0)
Alkaline Phosphatase: 173 U/L — ABNORMAL HIGH (ref 38–126)
Anion gap: 10 (ref 5–15)
BUN: 39 mg/dL — ABNORMAL HIGH (ref 8–23)
CO2: 26 mmol/L (ref 22–32)
Calcium: 12.4 mg/dL — ABNORMAL HIGH (ref 8.9–10.3)
Chloride: 100 mmol/L (ref 98–111)
Creatinine: 2.82 mg/dL — ABNORMAL HIGH (ref 0.44–1.00)
GFR, Estimated: 17 mL/min — ABNORMAL LOW (ref >60)
Glucose, Bld: 135 mg/dL — ABNORMAL HIGH (ref 70–99)
Potassium: 4 mmol/L (ref 3.5–5.1)
Sodium: 136 mmol/L (ref 135–145)
Total Bilirubin: 0.6 mg/dL (ref 0.3–1.2)
Total Protein: 7.1 g/dL (ref 6.5–8.1)

## 2021-06-12 LAB — BPAM RBC
Blood Product Expiration Date: 202304252359
Unit Type and Rh: 9500

## 2021-06-12 LAB — TYPE AND SCREEN
ABO/RH(D): O POS
Antibody Screen: POSITIVE
Unit division: 0

## 2021-06-12 LAB — SAMPLE TO BLOOD BANK

## 2021-06-12 LAB — PREPARE RBC (CROSSMATCH)

## 2021-06-12 MED ORDER — SODIUM CHLORIDE 0.9% FLUSH
10.0000 mL | INTRAVENOUS | Status: DC | PRN
  Administered 2021-06-12: 10 mL

## 2021-06-12 MED ORDER — HEPARIN SOD (PORK) LOCK FLUSH 100 UNIT/ML IV SOLN: 500.0000 [IU] | Freq: Once | INTRAVENOUS | Status: DC | PRN

## 2021-06-12 NOTE — Progress Notes (Signed)
Zometa tomorrow for hypercalcemia which will be given PRN. Orders entered.  ?

## 2021-06-12 NOTE — Addendum Note (Signed)
Addended by: Wylene Men on: 06/12/2021 10:02 AM ? ? Modules accepted: Orders ? ?

## 2021-06-12 NOTE — Progress Notes (Signed)
The previously ordered chemotherapy with elotuzumab, Pomalyst and Decadron will be discontinued at [facility] since the patient has approval of the same regiment at [facility] and she will continue her treatment under the care of Dr. Rosemarie Ax. ?I will continue to help with her care with any supportive care if needed. ?We will cancel all the future chemotherapy and labs that has been scheduled at [facility] health and we will see the patient on as-needed basis. ?

## 2021-06-12 NOTE — Telephone Encounter (Addendum)
This is a duplicate encounter. Please see separate encounter for details. ?

## 2021-06-12 NOTE — Progress Notes (Signed)
Pt informed that she will need to return tomorrow to receive blood at 8AM. Pt expressed understanding.  ?

## 2021-06-12 NOTE — Progress Notes (Addendum)
Pt arrived for appointment today without blue blood band bracelet. Blood bank notified. Pt sent to lab/flush to begin unit preparation process anew. Pt updated on new plan. Son called and updated with pt permission. ?

## 2021-06-13 ENCOUNTER — Telehealth: Payer: Self-pay

## 2021-06-13 ENCOUNTER — Inpatient Hospital Stay: Payer: Self-pay

## 2021-06-13 ENCOUNTER — Other Ambulatory Visit: Payer: Self-pay

## 2021-06-13 VITALS — BP 147/75 | HR 85 | Temp 97.7°F | Resp 18

## 2021-06-13 DIAGNOSIS — C9000 Multiple myeloma not having achieved remission: Secondary | ICD-10-CM

## 2021-06-13 DIAGNOSIS — I5041 Acute combined systolic (congestive) and diastolic (congestive) heart failure: Secondary | ICD-10-CM

## 2021-06-13 DIAGNOSIS — Z95828 Presence of other vascular implants and grafts: Secondary | ICD-10-CM

## 2021-06-13 DIAGNOSIS — D649 Anemia, unspecified: Secondary | ICD-10-CM

## 2021-06-13 MED ORDER — ZOLEDRONIC ACID 4 MG/5ML IV CONC
3.0000 mg | Freq: Once | INTRAVENOUS | Status: AC
  Administered 2021-06-13: 3 mg via INTRAVENOUS
  Filled 2021-06-13: qty 3.75

## 2021-06-13 MED ORDER — PNEUMOCOCCAL 20-VAL CONJ VACC 0.5 ML IM SUSY
0.5000 mL | PREFILLED_SYRINGE | Freq: Once | INTRAMUSCULAR | Status: AC
  Administered 2021-06-13: 0.5 mL via INTRAMUSCULAR
  Filled 2021-06-13: qty 0.5

## 2021-06-13 MED ORDER — SODIUM CHLORIDE 0.9% IV SOLUTION
250.0000 mL | Freq: Once | INTRAVENOUS | Status: AC
  Administered 2021-06-13: 250 mL via INTRAVENOUS

## 2021-06-13 MED ORDER — SODIUM CHLORIDE 0.9% FLUSH
10.0000 mL | INTRAVENOUS | Status: DC | PRN
  Administered 2021-06-13: 10 mL

## 2021-06-13 MED ORDER — DIPHENHYDRAMINE HCL 25 MG PO CAPS
25.0000 mg | ORAL_CAPSULE | Freq: Once | ORAL | Status: AC
  Administered 2021-06-13: 25 mg via ORAL
  Filled 2021-06-13: qty 1

## 2021-06-13 MED ORDER — HEPARIN SOD (PORK) LOCK FLUSH 100 UNIT/ML IV SOLN
500.0000 [IU] | Freq: Once | INTRAVENOUS | Status: AC | PRN
  Administered 2021-06-13: 500 [IU]

## 2021-06-13 MED ORDER — ACETAMINOPHEN 325 MG PO TABS
650.0000 mg | ORAL_TABLET | Freq: Once | ORAL | Status: AC
  Administered 2021-06-13: 650 mg via ORAL
  Filled 2021-06-13: qty 2

## 2021-06-13 NOTE — Progress Notes (Signed)
CrCl~24 ml/min spoke with PA Cassie and she confirmed with Dr. Julien Nordmann that he would like to give zometa today for hypercalcemia. He would like for [place] to reduce the dose to 3 mg.  ? ?Also, MD doesn't want patient to have fluids at today ? ?Larene Beach, PharmD ? ?

## 2021-06-13 NOTE — Telephone Encounter (Signed)
Pts sone sent a hand written note requesting we please give her the Prevnar 23 vaccine. ? ?I have called pts MD office for verification of her vaccine hx (need to determine when and if she had the Prevnar 20 vaccine. I was not able to speak with anyone but I have left a message for her providers nurse requesting a return call or to fax her vaccine hx to [place]. I have left our fax number, ? ? ?

## 2021-06-13 NOTE — Telephone Encounter (Signed)
I've spoken with the pts son and advised that pt can received the Prevnar20 vaccine since she has not received any of the PNA vaccines per the son. I advised we will help with this today but going forward, she really needs to get her vaccines at her PCPs office. He requested that we provide her with something to eat because she didn't eat this morning. I have advised I will ensure she eats as soon as we get off the phone. Pts son expressed understanding of this information. ?

## 2021-06-13 NOTE — Addendum Note (Signed)
Addended by: Claretha Cooper on: 06/13/2021 08:53 AM ? ? Modules accepted: Orders ? ?

## 2021-06-13 NOTE — Patient Instructions (Addendum)
Blood Transfusion, Adult, Care After ?This sheet gives you information about how to care for yourself after your procedure. Your doctor may also give you more specific instructions. If you have problems or questions, contact your doctor. ?What can I expect after the procedure? ?After the procedure, it is common to have: ?Bruising and soreness at the IV site. ?A headache. ?Follow these instructions at home: ?Insertion site care ? ?  ? ?Follow instructions from your doctor about how to take care of your insertion site. This is where an IV tube was put into your vein. Make sure you: ?Wash your hands with soap and water before and after you change your bandage (dressing). If you cannot use soap and water, use hand sanitizer. ?Change your bandage as told by your doctor. ?Check your insertion site every day for signs of infection. Check for: ?Redness, swelling, or pain. ?Bleeding from the site. ?Warmth. ?Pus or a bad smell. ?General instructions ?Take over-the-counter and prescription medicines only as told by your doctor. ?Rest as told by your doctor. ?Go back to your normal activities as told by your doctor. ?Keep all follow-up visits as told by your doctor. This is important. ?Contact a doctor if: ?You have itching or red, swollen areas of skin (hives). ?You feel worried or nervous (anxious). ?You feel weak after doing your normal activities. ?You have redness, swelling, warmth, or pain around the insertion site. ?You have blood coming from the insertion site, and the blood does not stop with pressure. ?You have pus or a bad smell coming from the insertion site. ?Get help right away if: ?You have signs of a serious reaction. This may be coming from an allergy or the body's defense system (immune system). Signs include: ?Trouble breathing or shortness of breath. ?Swelling of the face or feeling warm (flushed). ?Fever or chills. ?Head, chest, or back pain. ?Dark pee (urine) or blood in the pee. ?Widespread rash. ?Fast  heartbeat. ?Feeling dizzy or light-headed. ?You may receive your blood transfusion in an outpatient setting. If so, you will be told whom to contact to report any reactions. ?These symptoms may be an emergency. Do not wait to see if the symptoms will go away. Get medical help right away. Call your local emergency services (911 in the [place] Do not drive yourself to the hospital. ?Summary ?Bruising and soreness at the IV site are common. ?Check your insertion site every day for signs of infection. ?Rest as told by your doctor. Go back to your normal activities as told by your doctor. ?Get help right away if you have signs of a serious reaction. ?This information is not intended to replace advice given to you by your health care provider. Make sure you discuss any questions you have with your health care provider. ?Zoledronic Acid Injection (Cancer) ?What is this medication? ?ZOLEDRONIC ACID (ZOE le dron ik AS id) treats high calcium levels in the blood caused by cancer. It may also be used with chemotherapy to treat weakened bones caused by cancer. It works by slowing down the release of calcium from bones. This lowers calcium levels in your blood. It also makes your bones stronger and less likely to break (fracture). It belongs to a group of medications called bisphosphonates. ?This medicine may be used for other purposes; ask your health care provider or pharmacist if you have questions. ?COMMON BRAND NAME(S): Zometa, Zometa Powder ?What should I tell my care team before I take this medication? ?They need to know if you have any of  these conditions: ?Dehydration ?Dental disease ?Kidney disease ?Liver disease ?Low levels of calcium in the blood ?Lung or breathing disease, such as asthma ?Receiving steroids, such as dexamethasone or prednisone ?An unusual or allergic reaction to zoledronic acid, other medications, foods, dyes, or preservatives ?Pregnant or trying to get pregnant ?Breast-feeding ?How should I use this  medication? ?This medication is injected into a vein. It is given by your care team in a hospital or clinic setting. ?Talk to your care team about the use of this medication in children. Special care may be needed. ?Overdosage: If you think you have taken too much of this medicine contact a poison control center or emergency room at once. ?NOTE: This medicine is only for you. Do not share this medicine with others. ?What if I miss a dose? ?Keep appointments for follow-up doses. It is important not to miss your dose. Call your care team if you are unable to keep an appointment. ?What may interact with this medication? ?Certain antibiotics given by injection ?Diuretics, such as bumetanide, furosemide ?NSAIDs, medications for pain and inflammation, such as ibuprofen or naproxen ?Teriparatide ?Thalidomide ?This list may not describe all possible interactions. Give your health care provider a list of all the medicines, herbs, non-prescription drugs, or dietary supplements you use. Also tell them if you smoke, drink alcohol, or use illegal drugs. Some items may interact with your medicine. ?What should I watch for while using this medication? ?Visit your care team for regular checks on your progress. It may be some time before you see the benefit from this medication. ?Some people who take this medication have severe bone, joint, or muscle pain. This medication may also increase your risk for jaw problems or a broken thigh bone. Tell your care team right away if you have severe pain in your jaw, bones, joints, or muscles. Tell you care team if you have any pain that does not go away or that gets worse. ?Tell your dentist and dental surgeon that you are taking this medication. You should not have major dental surgery while on this medication. See your dentist to have a dental exam and fix any dental problems before starting this medication. Take good care of your teeth while on this medication. Make sure you see your  dentist for regular follow-up appointments. ?You should make sure you get enough calcium and vitamin D while you are taking this medication. Discuss the foods you eat and the vitamins you take with your care team. ?Check with your care team if you have severe diarrhea, nausea, and vomiting, or if you sweat a lot. The loss of too much body fluid may make it dangerous for you to take this medication. ?You may need bloodwork while taking this medication. ?Talk to your care team if you wish to become pregnant or think you might be pregnant. This medication can cause serious birth defects. ?What side effects may I notice from receiving this medication? ?Side effects that you should report to your care team as soon as possible: ?Allergic reactions--skin rash, itching, hives, swelling of the face, lips, tongue, or throat ?Kidney injury--decrease in the amount of urine, swelling of the ankles, hands, or feet ?Low calcium level--muscle pain or cramps, confusion, tingling, or numbness in the hands or feet ?Osteonecrosis of the jaw--pain, swelling, or redness in the mouth, numbness of the jaw, poor healing after dental work, unusual discharge from the mouth, visible bones in the mouth ?Severe bone, joint, or muscle pain ?Side effects that usually do  not require medical attention (report to your care team if they continue or are bothersome): ?Constipation ?Fatigue ?Fever ?Loss of appetite ?Nausea ?Stomach pain ?This list may not describe all possible side effects. Call your doctor for medical advice about side effects. You may report side effects to FDA at 1-800-FDA-1088. ?Where should I keep my medication? ?This medication is given in a hospital or clinic. It will not be stored at home. ?NOTE: This sheet is a summary. It may not cover all possible information. If you have questions about this medicine, talk to your doctor, pharmacist, or health care provider. ?? 2023 Elsevier/Gold Standard (2021-04-05 00:00:00) ?Pneumococcal  Conjugate Vaccine (Prevnar 20) Suspension for Injection ?What is this medication? ?PNEUMOCOCCAL VACCINE (NEU mo KOK al vak SEEN) is a vaccine. It prevents pneumococcus bacterial infections. These bacteria ca

## 2021-06-14 LAB — TYPE AND SCREEN
ABO/RH(D): O POS
Antibody Screen: POSITIVE
Unit division: 0

## 2021-06-14 LAB — BPAM RBC
Blood Product Expiration Date: 202304252359
ISSUE DATE / TIME: 202304200841
Unit Type and Rh: 9500

## 2021-06-18 ENCOUNTER — Encounter: Payer: Self-pay | Admitting: Internal Medicine

## 2021-06-19 ENCOUNTER — Telehealth: Payer: Self-pay | Admitting: Medical Oncology

## 2021-06-19 ENCOUNTER — Other Ambulatory Visit (HOSPITAL_COMMUNITY): Payer: Self-pay

## 2021-06-19 NOTE — Telephone Encounter (Signed)
FYI-Pt scheduled for treatment at [facility] on 06/19/21. ?

## 2021-07-08 NOTE — Progress Notes (Deleted)
Cardiology Office Note:    Date:  07/08/2021   ID:  Debbie Chan, DOB Apr 08, 1950, MRN [id number]  PCP:  Debbie Ebbs, MD [place] Cardiologist: Debbie Heinz, MD   Reason for visit: Hospital follow-up  History of Present Illness:    Debbie Chan is a 71 y.o. female with a hx of ***  ??? Admitted to [facility]    Past Medical History:  Diagnosis Date   Cancer [facility])     Past Surgical History:  Procedure Laterality Date   IR IMAGING GUIDED PORT INSERTION  05/10/2019    Current Medications: No outpatient medications have been marked as taking for the 07/09/21 encounter (Appointment) with Debbie Lacy, PA-C.     Allergies:   Patient has no known allergies.   Social History   Socioeconomic History   Marital status: Widowed    Spouse name: Not on file   Number of children: 4   Years of education: Not on file   Highest education level: Not on file  Occupational History   Not on file  Tobacco Use   Smoking status: Never   Smokeless tobacco: Never  Vaping Use   Vaping Use: Never used  Substance and Sexual Activity   Alcohol use: Never   Drug use: Never   Sexual activity: Not on file  Other Topics Concern   Not on file  Social History Narrative   Not on file   Social Determinants of Health   Financial Resource Strain: Not on file  Food Insecurity: Not on file  Transportation Needs: Not on file  Physical Activity: Not on file  Stress: Not on file  Social Connections: Not on file     Family History: The patient's family history includes Hypertension in an other family member.  ROS:   Please see the history of present illness.     EKGs/Labs/Other Studies Reviewed:    EKG:  The ekg ordered today demonstrates ***  Recent Labs: 06/12/2021: ALT 9; BUN 39; Creatinine 2.82; Hemoglobin 8.6; Platelet Count 75; Potassium 4.0; Sodium 136   Recent Lipid Panel No results found for: CHOL, TRIG, HDL, LDLCALC,  LDLDIRECT  Physical Exam:    VS:  There were no vitals taken for this visit.   No data found.  Wt Readings from Last 3 Encounters:  06/10/21 184 lb 1.6 oz (83.5 kg)  01/03/21 177 lb 8 oz (80.5 kg)  12/06/20 177 lb 12.8 oz (80.6 kg)     GEN: *** Well nourished, well developed in no acute distress HEENT: Normal NECK: No JVD; No carotid bruits CARDIAC: ***RRR, no murmurs, rubs, gallops RESPIRATORY:  Clear to auscultation without rales, wheezing or rhonchi  ABDOMEN: Soft, non-tender, non-distended MUSCULOSKELETAL: No edema; No deformity  SKIN: Warm and dry NEUROLOGIC:  Alert and oriented PSYCHIATRIC:  Normal affect     ASSESSMENT AND PLAN   ***   {Are you ordering a CV Procedure (e.g. stress test, cath, DCCV, TEE, etc)?   Press F2        :[id number]}    Medication Adjustments/Labs and Tests Ordered: Current medicines are reviewed at length with the patient today.  Concerns regarding medicines are outlined above.  No orders of the defined types were placed in this encounter.  No orders of the defined types were placed in this encounter.   There are no Patient Instructions on file for this visit.   Signed, Debbie Lacy, PA-C  07/08/2021 1:23 PM    [facility]

## 2021-07-09 ENCOUNTER — Ambulatory Visit: Payer: Self-pay | Admitting: Physician Assistant

## 2021-07-12 ENCOUNTER — Encounter: Payer: Self-pay | Admitting: Physician Assistant

## 2021-07-14 NOTE — Progress Notes (Signed)
This encounter was created in error - please disregard.

## 2021-07-16 ENCOUNTER — Encounter: Payer: Self-pay | Admitting: Physician Assistant

## 2021-07-17 ENCOUNTER — Other Ambulatory Visit (HOSPITAL_COMMUNITY): Payer: Self-pay

## 2021-07-17 ENCOUNTER — Other Ambulatory Visit: Payer: Self-pay

## 2021-07-17 NOTE — Progress Notes (Signed)
Opened in error

## 2021-08-15 ENCOUNTER — Telehealth: Payer: Self-pay | Admitting: Medical Oncology

## 2021-08-15 NOTE — Telephone Encounter (Signed)
Deceased-  Debbie Chan went to [place] , [place] on May 25th . She was  with family. In June she was rushed to the hospital unable to speak and passed on 2022/08/16 . [name] expressed his thanks for the care we gave his mother.

## 2022-04-25 IMAGING — DX DG CHEST 2V
2 series · 2 of 2 positions shown · non-contrast
Comparison: 09/28/2017.

CLINICAL DATA: Cough.

EXAM:
CHEST - 2 VIEW

[chest pa]
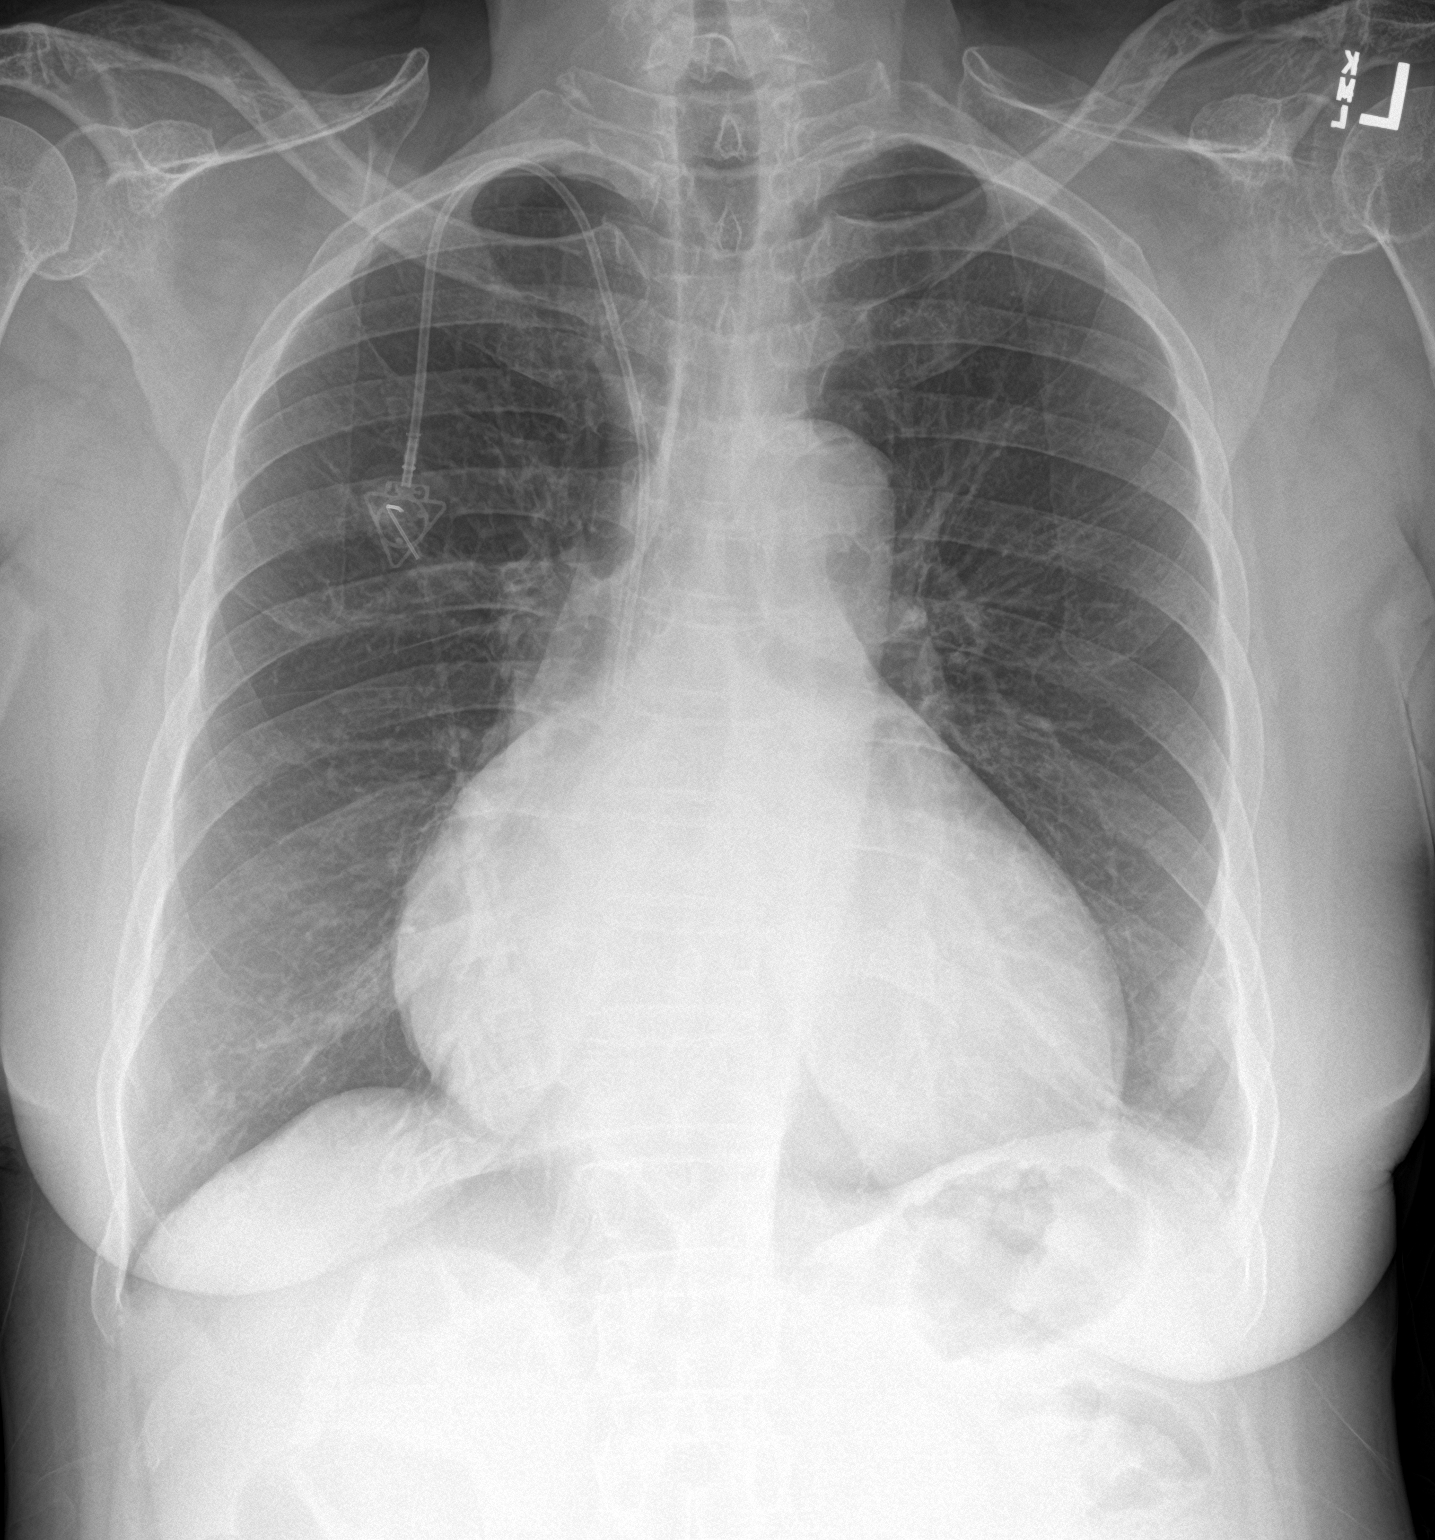

[chest lat]
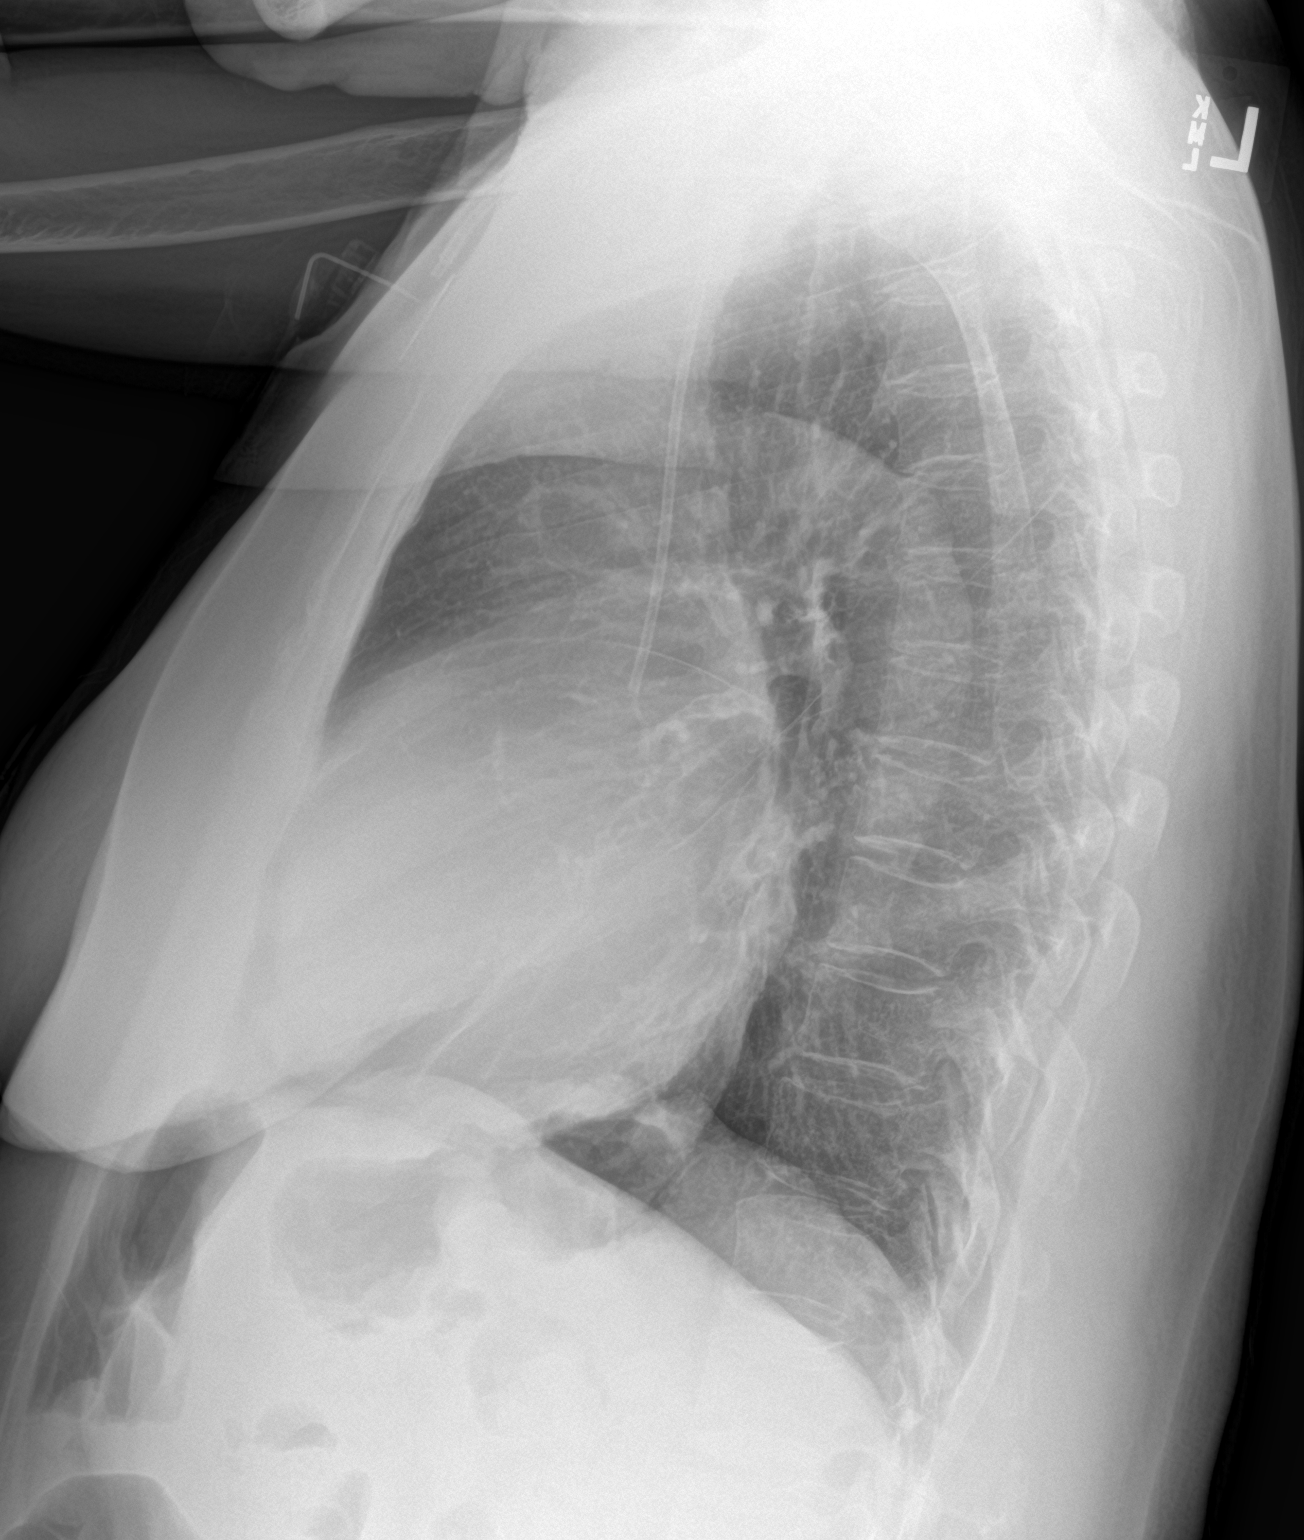

[2 of 2 positions shown; findings below may reference images not displayed]

FINDINGS: Enlarged cardiac silhouette with globular appearance. Right IJ
approach Port-A-Cath with the tip projecting at the superior
cavoatrial junction. Linear left basilar opacities. No visible
pleural effusions or pneumothorax. No acute osseous abnormality.
IMPRESSION: 1. Linear left basilar opacities, which could represent atelectasis,
aspiration, and/or pneumonia.
2. Enlarged cardiac silhouette with a globular appearance. This
could relate to cardiomegaly and/or pericardial effusion. Consider
correlation with an echocardiogram.

## 2023-11-02 NOTE — Progress Notes (Signed)
Orders placed in this encounter.
